# Patient Record
Sex: Female | Born: 1935 | Race: White | Hispanic: No | Smoking: Never smoker
Health system: Southern US, Community
[De-identification: ages and names within clinical notes are randomized; demographics above are authoritative.]

## PROBLEM LIST (undated history)

## (undated) DIAGNOSIS — E785 Hyperlipidemia, unspecified: Secondary | ICD-10-CM

## (undated) DIAGNOSIS — I639 Cerebral infarction, unspecified: Secondary | ICD-10-CM

## (undated) DIAGNOSIS — I2510 Atherosclerotic heart disease of native coronary artery without angina pectoris: Secondary | ICD-10-CM

## (undated) DIAGNOSIS — I10 Essential (primary) hypertension: Secondary | ICD-10-CM

## (undated) DIAGNOSIS — E119 Type 2 diabetes mellitus without complications: Secondary | ICD-10-CM

## (undated) DIAGNOSIS — I701 Atherosclerosis of renal artery: Secondary | ICD-10-CM

## (undated) DIAGNOSIS — I495 Sick sinus syndrome: Secondary | ICD-10-CM

## (undated) DIAGNOSIS — Z950 Presence of cardiac pacemaker: Secondary | ICD-10-CM

---

## 2014-05-15 HISTORY — PX: PACEMAKER PLACEMENT: SHX43

## 2016-09-23 ENCOUNTER — Emergency Department (HOSPITAL_COMMUNITY): Payer: Medicare Other

## 2016-09-23 ENCOUNTER — Encounter (HOSPITAL_COMMUNITY): Payer: Self-pay | Admitting: Emergency Medicine

## 2016-09-23 ENCOUNTER — Inpatient Hospital Stay (HOSPITAL_COMMUNITY)
Admission: EM | Admit: 2016-09-23 | Discharge: 2016-09-26 | DRG: 065 | Disposition: A | Discharge status: Rehabilitation Facility | Payer: Medicare Other | Attending: Internal Medicine | Admitting: Internal Medicine

## 2016-09-23 DIAGNOSIS — Z6833 Body mass index (BMI) 33.0-33.9, adult: Secondary | ICD-10-CM

## 2016-09-23 DIAGNOSIS — Z79899 Other long term (current) drug therapy: Secondary | ICD-10-CM | POA: Diagnosis not present

## 2016-09-23 DIAGNOSIS — R7309 Other abnormal glucose: Secondary | ICD-10-CM | POA: Diagnosis not present

## 2016-09-23 DIAGNOSIS — G4700 Insomnia, unspecified: Secondary | ICD-10-CM | POA: Diagnosis not present

## 2016-09-23 DIAGNOSIS — F419 Anxiety disorder, unspecified: Secondary | ICD-10-CM | POA: Diagnosis not present

## 2016-09-23 DIAGNOSIS — E1142 Type 2 diabetes mellitus with diabetic polyneuropathy: Secondary | ICD-10-CM | POA: Diagnosis not present

## 2016-09-23 DIAGNOSIS — E1149 Type 2 diabetes mellitus with other diabetic neurological complication: Secondary | ICD-10-CM | POA: Diagnosis not present

## 2016-09-23 DIAGNOSIS — E1140 Type 2 diabetes mellitus with diabetic neuropathy, unspecified: Secondary | ICD-10-CM | POA: Diagnosis present

## 2016-09-23 DIAGNOSIS — Z66 Do not resuscitate: Secondary | ICD-10-CM | POA: Diagnosis present

## 2016-09-23 DIAGNOSIS — E1143 Type 2 diabetes mellitus with diabetic autonomic (poly)neuropathy: Secondary | ICD-10-CM | POA: Diagnosis not present

## 2016-09-23 DIAGNOSIS — G8194 Hemiplegia, unspecified affecting left nondominant side: Secondary | ICD-10-CM | POA: Diagnosis present

## 2016-09-23 DIAGNOSIS — R29703 NIHSS score 3: Secondary | ICD-10-CM | POA: Diagnosis present

## 2016-09-23 DIAGNOSIS — Z7901 Long term (current) use of anticoagulants: Secondary | ICD-10-CM | POA: Diagnosis not present

## 2016-09-23 DIAGNOSIS — N179 Acute kidney failure, unspecified: Secondary | ICD-10-CM | POA: Diagnosis present

## 2016-09-23 DIAGNOSIS — I2510 Atherosclerotic heart disease of native coronary artery without angina pectoris: Secondary | ICD-10-CM | POA: Diagnosis present

## 2016-09-23 DIAGNOSIS — R29810 Facial weakness: Secondary | ICD-10-CM | POA: Diagnosis present

## 2016-09-23 DIAGNOSIS — F329 Major depressive disorder, single episode, unspecified: Secondary | ICD-10-CM | POA: Diagnosis present

## 2016-09-23 DIAGNOSIS — I69354 Hemiplegia and hemiparesis following cerebral infarction affecting left non-dominant side: Secondary | ICD-10-CM | POA: Diagnosis present

## 2016-09-23 DIAGNOSIS — R4182 Altered mental status, unspecified: Secondary | ICD-10-CM | POA: Diagnosis not present

## 2016-09-23 DIAGNOSIS — I6789 Other cerebrovascular disease: Secondary | ICD-10-CM | POA: Diagnosis not present

## 2016-09-23 DIAGNOSIS — Z7982 Long term (current) use of aspirin: Secondary | ICD-10-CM

## 2016-09-23 DIAGNOSIS — I639 Cerebral infarction, unspecified: Secondary | ICD-10-CM | POA: Diagnosis present

## 2016-09-23 DIAGNOSIS — I10 Essential (primary) hypertension: Secondary | ICD-10-CM | POA: Diagnosis present

## 2016-09-23 DIAGNOSIS — K5901 Slow transit constipation: Secondary | ICD-10-CM | POA: Diagnosis present

## 2016-09-23 DIAGNOSIS — E782 Mixed hyperlipidemia: Secondary | ICD-10-CM | POA: Diagnosis not present

## 2016-09-23 DIAGNOSIS — R3915 Urgency of urination: Secondary | ICD-10-CM | POA: Diagnosis not present

## 2016-09-23 DIAGNOSIS — Z950 Presence of cardiac pacemaker: Secondary | ICD-10-CM | POA: Diagnosis not present

## 2016-09-23 DIAGNOSIS — I495 Sick sinus syndrome: Secondary | ICD-10-CM | POA: Diagnosis present

## 2016-09-23 DIAGNOSIS — Z7902 Long term (current) use of antithrombotics/antiplatelets: Secondary | ICD-10-CM | POA: Diagnosis not present

## 2016-09-23 DIAGNOSIS — I150 Renovascular hypertension: Secondary | ICD-10-CM | POA: Diagnosis not present

## 2016-09-23 DIAGNOSIS — I63411 Cerebral infarction due to embolism of right middle cerebral artery: Secondary | ICD-10-CM | POA: Diagnosis not present

## 2016-09-23 DIAGNOSIS — R748 Abnormal levels of other serum enzymes: Secondary | ICD-10-CM

## 2016-09-23 DIAGNOSIS — R4781 Slurred speech: Secondary | ICD-10-CM | POA: Diagnosis present

## 2016-09-23 DIAGNOSIS — E784 Other hyperlipidemia: Secondary | ICD-10-CM | POA: Diagnosis not present

## 2016-09-23 DIAGNOSIS — E781 Pure hyperglyceridemia: Secondary | ICD-10-CM | POA: Diagnosis present

## 2016-09-23 DIAGNOSIS — K5900 Constipation, unspecified: Secondary | ICD-10-CM | POA: Diagnosis not present

## 2016-09-23 DIAGNOSIS — E871 Hypo-osmolality and hyponatremia: Secondary | ICD-10-CM | POA: Diagnosis not present

## 2016-09-23 DIAGNOSIS — I69390 Apraxia following cerebral infarction: Secondary | ICD-10-CM | POA: Diagnosis not present

## 2016-09-23 DIAGNOSIS — E869 Volume depletion, unspecified: Secondary | ICD-10-CM | POA: Diagnosis present

## 2016-09-23 DIAGNOSIS — E785 Hyperlipidemia, unspecified: Secondary | ICD-10-CM | POA: Diagnosis not present

## 2016-09-23 DIAGNOSIS — E119 Type 2 diabetes mellitus without complications: Secondary | ICD-10-CM

## 2016-09-23 DIAGNOSIS — N289 Disorder of kidney and ureter, unspecified: Secondary | ICD-10-CM | POA: Diagnosis not present

## 2016-09-23 DIAGNOSIS — E669 Obesity, unspecified: Secondary | ICD-10-CM | POA: Diagnosis present

## 2016-09-23 DIAGNOSIS — I169 Hypertensive crisis, unspecified: Secondary | ICD-10-CM | POA: Diagnosis not present

## 2016-09-23 DIAGNOSIS — Z7984 Long term (current) use of oral hypoglycemic drugs: Secondary | ICD-10-CM | POA: Diagnosis not present

## 2016-09-23 DIAGNOSIS — I959 Hypotension, unspecified: Secondary | ICD-10-CM | POA: Diagnosis not present

## 2016-09-23 DIAGNOSIS — B964 Proteus (mirabilis) (morganii) as the cause of diseases classified elsewhere: Secondary | ICD-10-CM | POA: Diagnosis not present

## 2016-09-23 DIAGNOSIS — I6300 Cerebral infarction due to thrombosis of unspecified precerebral artery: Secondary | ICD-10-CM | POA: Diagnosis not present

## 2016-09-23 DIAGNOSIS — M792 Neuralgia and neuritis, unspecified: Secondary | ICD-10-CM

## 2016-09-23 DIAGNOSIS — N390 Urinary tract infection, site not specified: Secondary | ICD-10-CM | POA: Diagnosis not present

## 2016-09-23 HISTORY — DX: Essential (primary) hypertension: I10

## 2016-09-23 HISTORY — DX: Presence of cardiac pacemaker: Z95.0

## 2016-09-23 HISTORY — DX: Type 2 diabetes mellitus without complications: E11.9

## 2016-09-23 LAB — CBC
HCT: 40.5 % (ref 36.0–46.0)
HCT: 43.1 % (ref 36.0–46.0)
HEMOGLOBIN: 13.8 g/dL (ref 12.0–15.0)
Hemoglobin: 14.2 g/dL (ref 12.0–15.0)
MCH: 29.4 pg (ref 26.0–34.0)
MCH: 28.9 pg (ref 26.0–34.0)
MCHC: 34.1 g/dL (ref 30.0–36.0)
MCHC: 32.9 g/dL (ref 30.0–36.0)
MCV: 86.4 fL (ref 78.0–100.0)
MCV: 87.6 fL (ref 78.0–100.0)
PLATELETS: 248 10*3/uL (ref 150–400)
PLATELETS: 242 10*3/uL (ref 150–400)
RBC: 4.69 MIL/uL (ref 3.87–5.11)
RBC: 4.92 MIL/uL (ref 3.87–5.11)
RDW: 14 % (ref 11.5–15.5)
RDW: 13.9 % (ref 11.5–15.5)
WBC: 9.6 10*3/uL (ref 4.0–10.5)
WBC: 7.2 10*3/uL (ref 4.0–10.5)

## 2016-09-23 LAB — URINALYSIS, ROUTINE W REFLEX MICROSCOPIC
Bilirubin Urine: NEGATIVE
GLUCOSE, UA: 250 mg/dL — AB
Hgb urine dipstick: NEGATIVE
KETONES UR: NEGATIVE mg/dL
LEUKOCYTES UA: NEGATIVE
NITRITE: NEGATIVE
PROTEIN: NEGATIVE mg/dL
Specific Gravity, Urine: 1.014 (ref 1.005–1.030)
pH: 6.5 (ref 5.0–8.0)

## 2016-09-23 LAB — COMPREHENSIVE METABOLIC PANEL
ALK PHOS: 41 U/L (ref 38–126)
ALT: 28 U/L (ref 14–54)
ANION GAP: 9 (ref 5–15)
AST: 35 U/L (ref 15–41)
Albumin: 4 g/dL (ref 3.5–5.0)
BUN: 22 mg/dL — ABNORMAL HIGH (ref 6–20)
CALCIUM: 9.1 mg/dL (ref 8.9–10.3)
CO2: 29 mmol/L (ref 22–32)
CREATININE: 1.21 mg/dL — AB (ref 0.44–1.00)
Chloride: 100 mmol/L — ABNORMAL LOW (ref 101–111)
GFR, EST AFRICAN AMERICAN: 44 mL/min — AB (ref >60)
GFR, EST NON AFRICAN AMERICAN: 38 mL/min — AB (ref >60)
Glucose, Bld: 262 mg/dL — ABNORMAL HIGH (ref 65–99)
Potassium: 4.5 mmol/L (ref 3.5–5.1)
SODIUM: 138 mmol/L (ref 135–145)
Total Bilirubin: 0.7 mg/dL (ref 0.3–1.2)
Total Protein: 6.5 g/dL (ref 6.5–8.1)

## 2016-09-23 LAB — CREATININE, SERUM
CREATININE: 1.12 mg/dL — AB (ref 0.44–1.00)
GFR, EST AFRICAN AMERICAN: 48 mL/min — AB (ref >60)
GFR, EST NON AFRICAN AMERICAN: 41 mL/min — AB (ref >60)

## 2016-09-23 LAB — CBG MONITORING, ED: GLUCOSE-CAPILLARY: 257 mg/dL — AB (ref 65–99)

## 2016-09-23 LAB — TROPONIN I

## 2016-09-23 MED ORDER — BUSPIRONE HCL 10 MG PO TABS
5.0000 mg | ORAL_TABLET | Freq: Two times a day (BID) | ORAL | Status: DC
  Administered 2016-09-23 – 2016-09-26 (×6): 5 mg via ORAL
  Filled 2016-09-23 (×6): qty 1

## 2016-09-23 MED ORDER — ASPIRIN 300 MG RE SUPP: 300.0000 mg | Freq: Every day | RECTAL | Status: DC

## 2016-09-23 MED ORDER — SODIUM CHLORIDE 0.9 % IV SOLN
INTRAVENOUS | Status: DC
  Administered 2016-09-23: 17:00:00 via INTRAVENOUS

## 2016-09-23 MED ORDER — ENOXAPARIN SODIUM 40 MG/0.4ML ~~LOC~~ SOLN
40.0000 mg | SUBCUTANEOUS | Status: DC
  Administered 2016-09-23 – 2016-09-26 (×4): 40 mg via SUBCUTANEOUS
  Filled 2016-09-23 (×4): qty 0.4

## 2016-09-23 MED ORDER — RANOLAZINE ER 500 MG PO TB12
500.0000 mg | ORAL_TABLET | Freq: Two times a day (BID) | ORAL | Status: DC
  Administered 2016-09-23 – 2016-09-26 (×6): 500 mg via ORAL
  Filled 2016-09-23 (×6): qty 1

## 2016-09-23 MED ORDER — PREGABALIN 75 MG PO CAPS
75.0000 mg | ORAL_CAPSULE | Freq: Two times a day (BID) | ORAL | Status: DC
  Administered 2016-09-23 – 2016-09-26 (×6): 75 mg via ORAL
  Filled 2016-09-23 (×6): qty 1

## 2016-09-23 MED ORDER — ASPIRIN 325 MG PO TABS
325.0000 mg | ORAL_TABLET | Freq: Every day | ORAL | Status: DC
  Administered 2016-09-24: 325 mg via ORAL
  Filled 2016-09-23: qty 1

## 2016-09-23 MED ORDER — MELATONIN 3 MG PO TABS
3.0000 mg | ORAL_TABLET | Freq: Every day | ORAL | Status: DC
  Administered 2016-09-23 – 2016-09-25 (×3): 3 mg via ORAL
  Filled 2016-09-23 (×4): qty 1

## 2016-09-23 MED ORDER — STROKE: EARLY STAGES OF RECOVERY BOOK
Freq: Once | Status: AC
  Administered 2016-09-23: 19:00:00

## 2016-09-23 MED ORDER — ASPIRIN EC 325 MG PO TBEC: 325.0000 mg | DELAYED_RELEASE_TABLET | Freq: Every day | ORAL | Status: DC

## 2016-09-23 MED ORDER — NIACIN 500 MG PO TABS
250.0000 mg | ORAL_TABLET | ORAL | Status: DC
  Administered 2016-09-24 – 2016-09-25 (×2): 250 mg via ORAL
  Filled 2016-09-23 (×3): qty 1

## 2016-09-23 MED ORDER — ISOSORBIDE MONONITRATE ER 30 MG PO TB24
30.0000 mg | ORAL_TABLET | Freq: Every day | ORAL | Status: DC
  Administered 2016-09-24 – 2016-09-26 (×3): 30 mg via ORAL
  Filled 2016-09-23 (×3): qty 1

## 2016-09-23 MED ORDER — SENNOSIDES-DOCUSATE SODIUM 8.6-50 MG PO TABS
1.0000 | ORAL_TABLET | Freq: Once | ORAL | Status: AC
  Administered 2016-09-23: 1 via ORAL
  Filled 2016-09-23: qty 1

## 2016-09-23 MED ORDER — METOPROLOL SUCCINATE ER 25 MG PO TB24
50.0000 mg | ORAL_TABLET | Freq: Every day | ORAL | Status: DC
  Administered 2016-09-23 – 2016-09-25 (×3): 50 mg via ORAL
  Filled 2016-09-23 (×3): qty 2

## 2016-09-23 MED ORDER — FENOFIBRATE 160 MG PO TABS
160.0000 mg | ORAL_TABLET | Freq: Every day | ORAL | Status: DC
  Administered 2016-09-24 – 2016-09-26 (×3): 160 mg via ORAL
  Filled 2016-09-23 (×3): qty 1

## 2016-09-23 NOTE — Progress Notes (Signed)
[facility]; patient is alert and oriented; son is at the bedside; patient oriented to room and unit routine; fall safety measures in place; patient cooperative.

## 2016-09-23 NOTE — ED Notes (Signed)
Pt family reports that pt has has intermittent slurred speech this week as well as some confusion. Pt adds that she has been forgetful and thought that someone was staying in her room last pm, but no one was there. Pt is A&O and in NAD. Pt only c/o chronic bilateral arm and foot pain from neuropathy.

## 2016-09-23 NOTE — ED Triage Notes (Addendum)
Patient brought in by son from [facility] in [place]. Altered mental status for 1 week now. Son states that patient has been forgetting routine things. Forgot how to use the bathroom properly, used it on the floor this morning. Unable to identify numbers and letters. Difficulty ambulating.

## 2016-09-23 NOTE — H&P (Signed)
[facility] History and Physical  Charlene Greene ZOX:[id number] DOB: 05-09-1922 DOA: 09/23/2016  Referring physician: Fayrene Fearing PCP: No primary care provider on file.  Specialists: Neurology  Chief Complaint:   HPI:  18 ? Htn HLD PPM 2/2 to SSS in July 14th, 2015-Charlene Greene Prior CAD Some reconstructive left breast surgery in the past? DM ty II on oral meds depression  Came to the emergency room from [facility] where she's been in the past 2-1/2 years She has been weak on her left side for the past 3-4 days and having difficulty with her usual ambulation with a walker Her daughter also states that previously very highly functioning and is able to recite things without any difficulty but she had some dysnomia over the past 2-3 days and was not able to pronounce the word "TWO" and we counted things to her daughter that never actually happened This morning or yesterday prior to admission was found with stool on the floor and she was found in her bedroom having all of her clothes off and having turned off denominator reporting for breakfast which is unusual behavior for her   Eventually she was transported to the [facility] and a workup was performed showing BUN/creatinine 22/1.2 Lloyd 100 Glucose 260, LFTs normal CBC within normal limits CT head showed abnormal right basal ganglia fashion Loraine Leriche old is acute infarction with questionable vasogenic edema Chest x-ray showed no active disease  No recent illnesses no diarrheal illness no cough no cold no fever no chills no nausea no vomiting some slight left sided weakness affecting ambulation     Past Medical History:  Diagnosis Date  . Diabetes mellitus without complication (HCC)   . Hypertension   . Pacemaker    Past Surgical History:  Procedure Laterality Date  . PACEMAKER PLACEMENT     Social History:  Social History   Social History Narrative  . No narrative on file    No  Known Allergies  No family history on file.  Hypertension Heart disease   Prior to Admission medications   Medication Sig Start Date End Date Taking? Authorizing Provider  busPIRone (BUSPAR) 5 MG tablet Take 5 mg by mouth 2 (two) times daily.   Yes Historical Provider, MD  cloNIDine (CATAPRES) 0.2 MG tablet Take 0.2 mg by mouth 2 (two) times daily.   Yes Historical Provider, MD  fenofibrate 160 MG tablet Take 160 mg by mouth daily.   Yes Historical Provider, MD  glimepiride (AMARYL) 2 MG tablet Take 2-3 mg by mouth 2 (two) times daily. 1 tab in the am and 1/2 hs   Yes Historical Provider, MD  isosorbide mononitrate (IMDUR) 60 MG 24 hr tablet Take 60 mg by mouth daily.   Yes Historical Provider, MD  lisinopril (PRINIVIL,ZESTRIL) 40 MG tablet Take 40 mg by mouth 2 (two) times daily.   Yes Historical Provider, MD  pregabalin (LYRICA) 75 MG capsule Take 75 mg by mouth 2 (two) times daily.   Yes Historical Provider, MD  metoprolol succinate (TOPROL-XL) 100 MG 24 hr tablet  08/31/16   Historical Provider, MD   Physical Exam: Vitals:   09/23/16 1215 09/23/16 1415 09/23/16 1635 09/23/16 1636  BP: 162/79 144/72 (!) 203/92 (!) 201/94  Pulse:  60 (!) 53   Resp: (!) 28 20 20    Temp:  98.4 F (36.9 C) 98.4 F (36.9 C)   TempSrc:  Oral Oral   SpO2:  93% 92%    EOMI NCAT Extraocular movements  intact Poor dentition No JVD No bruit S1-S2 no murmur rub or gallop Chest clinically clear Finger-nose-finger test is intact Abdomen is soft nontender nondistended no rebound or guarding No lower extremity edema Power in lower and upper extremities is within normal limits Sensory is grossly intact There is some diffuse swelling to the right upper extremity  Labs on Admission:  Basic Metabolic Panel:  Recent Labs Lab 09/23/16 1136 09/23/16 1650  NA 138  --   K 4.5  --   CL 100*  --   CO2 29  --   GLUCOSE 262*  --   BUN 22*  --   CREATININE 1.21* 1.12*  CALCIUM 9.1  --    Liver  Function Tests:  Recent Labs Lab 09/23/16 1136  AST 35  ALT 28  ALKPHOS 41  BILITOT 0.7  PROT 6.5  ALBUMIN 4.0   No results for input(s): LIPASE, AMYLASE in the last 168 hours. No results for input(s): AMMONIA in the last 168 hours. CBC:  Recent Labs Lab 09/23/16 1136 09/23/16 1650  WBC 9.6 7.2  HGB 13.8 14.2  HCT 40.5 43.1  MCV 86.4 87.6  PLT 248 242   Cardiac Enzymes:  Recent Labs Lab 09/23/16 1136  TROPONINI <0.03    BNP (last 3 results) No results for input(s): BNP in the last 8760 hours.  ProBNP (last 3 results) No results for input(s): PROBNP in the last 8760 hours.  CBG:  Recent Labs Lab 09/23/16 1145  GLUCAP 257*    Radiological Exams on Admission: Ct Head Wo Contrast  Result Date: 09/23/2016 CLINICAL DATA:  Mental status deterioration over the last week. EXAM: CT HEAD WITHOUT CONTRAST TECHNIQUE: Contiguous axial images were obtained from the base of the skull through the vertex without intravenous contrast. COMPARISON:  None. FINDINGS: Brain: The brain shows generalized atrophy. There are moderate chronic appearing small vessel ischemic changes affecting the cerebral hemispheric white matter. There is an abnormal pattern in the right basal gangliar region which could relate to old and/or recent infarction. However, some characteristics suggest the possibility of vasogenic edema. Therefore, MRI would be suggested if the patient is a candidate. No evidence of hydrocephalus or extra-axial collection. Vascular: There is atherosclerotic calcification of the major vessels at the base of the brain. Skull: Negative Sinuses/Orbits: Clear/normal Other: None significant IMPRESSION: Atrophy an extensive chronic small vessel ischemic changes. Abnormal appearance of the right basal gangliar region that could represent a combination of old and acute infarction. However, the pattern is somewhat unusual, raising the possibility of vasogenic edema. MRI would be suggested if  the patient is a candidate. Electronically Signed   By: Paulina FusiMark  Shogry M.D.   On: 09/23/2016 12:51   Dg Chest Port 1 View  Result Date: 09/23/2016 CLINICAL DATA:  Slurred speech and confusion. EXAM: PORTABLE CHEST 1 VIEW COMPARISON:  None. FINDINGS: No pneumothorax. Cardiomegaly. The hila and mediastinum are unremarkable. No pulmonary nodules or masses. Mild left basilar atelectasis. No suspicious infiltrate. Chronic changes in the shoulders, left worse than right. IMPRESSION: No active disease. Electronically Signed   By: Gerome Samavid  Williams III M.D   On: 09/23/2016 12:55    EKG: Independently reviewed. Atrial paced rhythm QRS axis is -308 ST-T wave changes  Assessment/Plan  R Basal ganlia CVA We [facility] neurolog[facility] requests her presence there We will admit her under standard stroke protocol  and we'll give her 325 ASA Therapy evaluations this patient will probably need skilled care  outpatient follow-up Hold antihypertensive medication for now and probable resumption is reasonable in the next couple of days to gradually bring down her blood pressure   sinus syndrome status post pacemaker placement 2015 MRI compatibility will have to be arranged at the radiology and they are aware of this  Type 2 diabetes mellitus with blood sugars in the 230 range Placed on sliding scale coverage before meals at bedtime once swallowing screen is evaluated Hold off on Amaryl 2 mg twice a day for now until discharge  CAD with history of CAD 201 Start Lower dose on Imdur 60-->30 for now but may be okay to resume the same as an outpatient Give 50 mg metoprolol XL as opposed to 100 mg daily and hold off on lisinopril for now Can continue Ranexa  500 2 times a day  Severe hypertriglyceridemia is usually on niacin and fenofibrate which can be continued  Mild volume depletion/acute kidney injury unclear baseline Start saline 50 cc per hour repeat labs a.m.   DO NOT  RESUSCITATE status confirmed with patient at bedside Long discussion with daughter who is a Engineer, civil (consulting)nurse at [employer] long past Inpatient tele    Pleas KochJai Diahn Waidelich, MD [facility] [phone]) [phone]      If 7PM-7AM, please contact night-coverage [email or web address] Password [email or web address] 09/23/2016, 5:29 PM

## 2016-09-23 NOTE — Progress Notes (Deleted)
[facility] History and Physical  Charlene Greene ZOX:[id number] DOB: 09/13/1922 DOA: 09/23/2016  Referring physician: Fayrene Fearing PCP: No primary care provider on file.  Specialists: Neurology  Chief Complaint:   HPI:  86 ? Htn HLD PPM 2/2 to SSS in July 14th, 2015-[place] Prior CAD Some reconstructive left breast surgery in the past? DM ty II on oral meds depression  Came to the emergency room from [facility] where she's been in the past 2-1/2 years She has been weak on her left side for the past 3-4 days and having difficulty with her usual ambulation with a walker Her daughter also states that previously very highly functioning and is able to recite things without any difficulty but she had some dysnomia over the past 2-3 days and was not able to pronounce the word "TWO" and we counted things to her daughter that never actually happened This morning or yesterday prior to admission was found with stool on the floor and she was found in her bedroom having all of her clothes off and having turned off denominator reporting for breakfast which is unusual behavior for her   Eventually she was transported to the hospital at [facility] and a workup was performed showing BUN/creatinine 22/1.2 Lloyd 100 Glucose 260, LFTs normal CBC within normal limits CT head showed abnormal right basal ganglia fashion Loraine Leriche old is acute infarction with questionable vasogenic edema Chest x-ray showed no active disease  No recent illnesses no diarrheal illness no cough no cold no fever no chills no nausea no vomiting some slight left sided weakness affecting ambulation     Past Medical History:  Diagnosis Date  . Diabetes mellitus without complication (HCC)   . Hypertension   . Pacemaker    No past surgical history on file. Social History:  Social History   Social History Narrative  . No narrative on file    No Known Allergies  No family history on file.   Hypertension Heart disease   Prior to Admission medications   Medication Sig Start Date End Date Taking? Authorizing Provider  busPIRone (BUSPAR) 5 MG tablet Take 5 mg by mouth 2 (two) times daily.   Yes Historical Provider, MD  cloNIDine (CATAPRES) 0.2 MG tablet Take 0.2 mg by mouth 2 (two) times daily.   Yes Historical Provider, MD  fenofibrate 160 MG tablet Take 160 mg by mouth daily.   Yes Historical Provider, MD  glimepiride (AMARYL) 2 MG tablet Take 2-3 mg by mouth 2 (two) times daily. 1 tab in the am and 1/2 hs   Yes Historical Provider, MD  isosorbide mononitrate (IMDUR) 60 MG 24 hr tablet Take 60 mg by mouth daily.   Yes Historical Provider, MD  lisinopril (PRINIVIL,ZESTRIL) 40 MG tablet Take 40 mg by mouth 2 (two) times daily.   Yes Historical Provider, MD  pregabalin (LYRICA) 75 MG capsule Take 75 mg by mouth 2 (two) times daily.   Yes Historical Provider, MD  metoprolol succinate (TOPROL-XL) 100 MG 24 hr tablet  08/31/16   Historical Provider, MD   Physical Exam: Vitals:   09/23/16 1125 09/23/16 1130 09/23/16 1147 09/23/16 1215  BP: 172/80 179/61  162/79  Pulse: 65 62    Resp: 17 (!) 27  (!) 28  Temp: 98.4 F (36.9 C)  98.4 F (36.9 C)   TempSrc: Oral     SpO2: 93% 94%     EOMI NCAT Extraocular movements intact Poor dentition No JVD No bruit S1-S2 no murmur rub  or gallop Chest clinically clear Finger-nose-finger test is intact Abdomen is soft nontender nondistended no rebound or guarding No lower extremity edema Power in lower and upper extremities is within normal limits Sensory is grossly intact There is some diffuse swelling to the right upper extremity  Labs on Admission:  Basic Metabolic Panel:  Recent Labs Lab 09/23/16 1136  NA 138  K 4.5  CL 100*  CO2 29  GLUCOSE 262*  BUN 22*  CREATININE 1.21*  CALCIUM 9.1   Liver Function Tests:  Recent Labs Lab 09/23/16 1136  AST 35  ALT 28  ALKPHOS 41  BILITOT 0.7  PROT 6.5  ALBUMIN 4.0   No  results for input(s): LIPASE, AMYLASE in the last 168 hours. No results for input(s): AMMONIA in the last 168 hours. CBC:  Recent Labs Lab 09/23/16 1136  WBC 9.6  HGB 13.8  HCT 40.5  MCV 86.4  PLT 248   Cardiac Enzymes:  Recent Labs Lab 09/23/16 1136  TROPONINI <0.03    BNP (last 3 results) No results for input(s): BNP in the last 8760 hours.  ProBNP (last 3 results) No results for input(s): PROBNP in the last 8760 hours.  CBG:  Recent Labs Lab 09/23/16 1145  GLUCAP 257*    Radiological Exams on Admission: Ct Head Wo Contrast  Result Date: 09/23/2016 CLINICAL DATA:  Mental status deterioration over the last week. EXAM: CT HEAD WITHOUT CONTRAST TECHNIQUE: Contiguous axial images were obtained from the base of the skull through the vertex without intravenous contrast. COMPARISON:  None. FINDINGS: Brain: The brain shows generalized atrophy. There are moderate chronic appearing small vessel ischemic changes affecting the cerebral hemispheric white matter. There is an abnormal pattern in the right basal gangliar region which could relate to old and/or recent infarction. However, some characteristics suggest the possibility of vasogenic edema. Therefore, MRI would be suggested if the patient is a candidate. No evidence of hydrocephalus or extra-axial collection. Vascular: There is atherosclerotic calcification of the major vessels at the base of the brain. Skull: Negative Sinuses/Orbits: Clear/normal Other: None significant IMPRESSION: Atrophy an extensive chronic small vessel ischemic changes. Abnormal appearance of the right basal gangliar region that could represent a combination of old and acute infarction. However, the pattern is somewhat unusual, raising the possibility of vasogenic edema. MRI would be suggested if the patient is a candidate. Electronically Signed   By: Paulina FusiMark  Shogry M.D.   On: 09/23/2016 12:51   Dg Chest Port 1 View  Result Date: 09/23/2016 CLINICAL DATA:   Slurred speech and confusion. EXAM: PORTABLE CHEST 1 VIEW COMPARISON:  None. FINDINGS: No pneumothorax. Cardiomegaly. The hila and mediastinum are unremarkable. No pulmonary nodules or masses. Mild left basilar atelectasis. No suspicious infiltrate. Chronic changes in the shoulders, left worse than right. IMPRESSION: No active disease. Electronically Signed   By: Gerome Samavid  Williams III M.D   On: 09/23/2016 12:55    EKG: Independently reviewed. Atrial paced rhythm QRS axis is -308 ST-T wave changes  Assessment/Plan  R Basal ganlia CVA We [facility] as neurologist requests her presence there We will admit her under standard stroke protocol  and we'll give her 325 ASA Therapy evaluations this patient will probably need skilled care outpatient follow-up Hold antihypertensive medication for now and probable resumption is reasonable in the next couple of days to gradually bring down her blood pressure   sinus syndrome status post pacemaker placement 2015 MRI compatibility will have to be arranged at  the radiology and they are aware of this  Type 2 diabetes mellitus with blood sugars in the 230 range Placed on sliding scale coverage before meals at bedtime once swallowing screen is evaluated Hold off on Amaryl 2 mg twice a day for now until discharge  CAD with history of CAD 201 Start Lower dose on Imdur 60-->30 for now but may be okay to resume the same as an outpatient Give 50 mg metoprolol XL as opposed to 100 mg daily and hold off on lisinopril for now Can continue Ranexa  500 2 times a day  Severe hypertriglyceridemia is usually on niacin and fenofibrate which can be continued  Mild volume depletion/acute kidney injury unclear baseline Start saline 50 cc per hour repeat labs a.m.   DO NOT RESUSCITATE status confirmed with patient at bedside Long discussion with daughter who is a Engineer, civil (consulting)nurse at [employer] long past Inpatient tele    Pleas KochJai Imani Sherrin, MD [facility] [phone]      If 7PM-7AM, please contact night-coverage [email or web address] Password [email or web address] 09/23/2016, 2:08 PM

## 2016-09-23 NOTE — ED Notes (Signed)
Bed: [id number] Expected date:  Expected time:  Means of arrival:  Comments: Hold 16 when discharge is out.

## 2016-09-23 NOTE — ED Provider Notes (Addendum)
[facility]-EMERGENCY DEPT Provider Note   CSN: [id number] Arrival date & time: 09/23/16  1111     History   Chief Complaint Chief Complaint  Patient presents with  . Altered Mental Status    HPI Charlene Greene is a 82 y.o. female. She presents for evaluation of confusion and weakness.  She is 89. According to her family she is "very sharp and still very with it". [place] independent living community [place]. They have noticed some changes for about one week.  About 2 weeks ago her "best friend died". She states that she was sat in more and this. However, going to family she still seemed well for more than a week before the symptoms started 7 days ago.  She notices difficulty walking. She feels very weak. In fact this morning her son had to help her dressed and stand and walk which is unusual for her.  She feels swollen with her left hand. She denies feeling weak or clumsy with her left hand or any unilateral weakness. No chest pain or shortness of breath. Has occasional cough. Does not feel short of breath. His had no fever. No appetite. All by mouth. No vomiting. No urinary symptoms.  History of diabetes and neuropathy and has chronic feet pain which lead her walking somewhat. No change in the appearance of her feet or her neuropathy symptoms  No change in medications. No fall or injury. No acute other symptoms.  She lives alone. However she put out a towel and soap and wash cough because she thought someone was staying at her apartment with her, when no one in fact has been.    HPI  Past Medical History:  Diagnosis Date  . Diabetes mellitus without complication (HCC)   . Hypertension   . Pacemaker     Patient Active Problem List   Diagnosis Date Noted  . CVA (cerebral vascular accident) (HCC) 09/23/2016    No past surgical history on file.  OB History    Gravida Para Term Preterm AB Living             2   SAB TAB Ectopic Multiple Live Births         Home Medications    Prior to Admission medications   Medication Sig Start Date End Date Taking? Authorizing Provider  amLODipine (NORVASC) 5 MG tablet Take 5 mg by mouth 2 (two) times daily.   Yes Historical Provider, MD  aspirin 325 MG EC tablet Take 325 mg by mouth daily.   Yes Historical Provider, MD  BL MAGNESIUM PO Take 1 tablet by mouth daily.   Yes Historical Provider, MD  busPIRone (BUSPAR) 5 MG tablet Take 5 mg by mouth 2 (two) times daily.   Yes Historical Provider, MD  calcium carbonate (OSCAL) 1500 (600 Ca) MG TABS tablet Take 600 mg of elemental calcium by mouth 2 (two) times daily with a meal.   Yes Historical Provider, MD  cloNIDine (CATAPRES) 0.2 MG tablet Take 0.2 mg by mouth 2 (two) times daily.   Yes Historical Provider, MD  fenofibrate 160 MG tablet Take 160 mg by mouth daily.   Yes Historical Provider, MD  glimepiride (AMARYL) 2 MG tablet Take 2-3 mg by mouth 2 (two) times daily. 1 tab in the am and 1/2 hs   Yes Historical Provider, MD  isosorbide mononitrate (IMDUR) 60 MG 24 hr tablet Take 60 mg by mouth daily.   Yes Historical Provider, MD  lisinopril (PRINIVIL,ZESTRIL) 40  MG tablet Take 40 mg by mouth 2 (two) times daily.   Yes Historical Provider, MD  Melatonin 3 MG TABS Take 3 mg by mouth at bedtime.   Yes Historical Provider, MD  metoprolol succinate (TOPROL-XL) 100 MG 24 hr tablet Take 100 mg by mouth daily.  08/31/16  Yes Historical Provider, MD  Multiple Vitamin (MULTIVITAMIN WITH MINERALS) TABS tablet Take 1 tablet by mouth daily.   Yes Historical Provider, MD  Multiple Vitamins-Minerals (PRESERVISION AREDS 2 PO) Take 1 capsule by mouth 2 (two) times daily.   Yes Historical Provider, MD  niacin 250 MG tablet Take 250 mg by mouth every morning.   Yes Historical Provider, MD  Omega-3 Fatty Acids (FISH OIL) 1000 MG CPDR Take 2,000 capsules by mouth 2 (two) times daily.   Yes Historical Provider, MD  pregabalin (LYRICA) 75 MG capsule Take 75 mg by mouth 2  (two) times daily.   Yes Historical Provider, MD  ranolazine (RANEXA) 500 MG 12 hr tablet Take 500 mg by mouth 2 (two) times daily.   Yes Historical Provider, MD    Family History No family history on file.  Social History Social History  Substance Use Topics  . Smoking status: Never Smoker  . Smokeless tobacco: Never Used  . Alcohol use No     Allergies   Patient has no known allergies.   Review of Systems Review of Systems  Constitutional: Positive for activity change. Negative for appetite change, chills, diaphoresis and fever.  HENT: Negative for mouth sores, sore throat and trouble swallowing.   Eyes: Negative for visual disturbance.  Respiratory: Negative for cough, chest tightness, shortness of breath and wheezing.   Cardiovascular: Negative for chest pain.  Gastrointestinal: Negative for abdominal distention, abdominal pain, diarrhea, nausea and vomiting.  Endocrine: Negative for polydipsia, polyphagia and polyuria.  Genitourinary: Negative for decreased urine volume, difficulty urinating, dysuria, frequency, hematuria and urgency.  Musculoskeletal: Negative for gait problem.  Skin: Negative for color change, pallor and rash.  Neurological: Positive for weakness. Negative for dizziness, syncope, light-headedness and headaches.  Hematological: Does not bruise/bleed easily.  Psychiatric/Behavioral: Positive for confusion. Negative for behavioral problems.     Physical Exam Updated Vital Signs BP 144/72 (BP Location: Left Arm)   Pulse 60   Temp 98.4 F (36.9 C) (Oral)   Resp 20   SpO2 93%   Physical Exam  Constitutional: She is oriented to person, place, and time. She appears well-developed and well-nourished. No distress.  HENT:  Head: Normocephalic.  Eyes: Conjunctivae are normal. Pupils are equal, round, and reactive to light. No scleral icterus.  Neck: Normal range of motion. Neck supple. No thyromegaly present.  Cardiovascular: Normal rate and regular  rhythm.  Exam reveals no gallop and no friction rub.   No murmur heard. Pulmonary/Chest: Effort normal and breath sounds normal. No respiratory distress. She has no wheezes. She has no rales.      Abdominal: Soft. Bowel sounds are normal. She exhibits no distension. There is no tenderness. There is no rebound.  Musculoskeletal: Normal range of motion.  Neurological: She is alert and oriented to person, place, and time.  No obvious cranial nerve deficits. Very subtle left pronator drift. Can lift each foot off of the bed independently. Can hold the right for 4-5 seconds. Can hold the left leg for only 1 second.  Skin: Skin is warm and dry. No rash noted.  Psychiatric: She has a normal mood and affect. Her behavior is normal.  ED Treatments / Results  Labs (all labs ordered are listed, but only abnormal results are displayed) Labs Reviewed  COMPREHENSIVE METABOLIC PANEL - Abnormal; Notable for the following:       Result Value   Chloride 100 (*)    Glucose, Bld 262 (*)    BUN 22 (*)    Creatinine, Ser 1.21 (*)    GFR calc non Af Amer 38 (*)    GFR calc Af Amer 44 (*)    All other components within normal limits  URINALYSIS, ROUTINE W REFLEX MICROSCOPIC (NOT AT [facility]) - Abnormal; Notable for the following:    Glucose, UA 250 (*)    All other components within normal limits  CBG MONITORING, ED - Abnormal; Notable for the following:    Glucose-Capillary 257 (*)    All other components within normal limits  URINE CULTURE  CBC  TROPONIN I    EKG  EKG Interpretation  Date/Time:  Sunday September 23 2016 11:25:17 EST Ventricular Rate:  66 PR Interval:    QRS Duration: 100 QT Interval:  521 QTC Calculation: 546 R Axis:   -26 Text Interpretation:  Atrial-paced rhythm Borderline left axis deviation Borderline repolarization abnormality Prolonged QT interval Confirmed by Fayrene Fearing  MD, Andilynn Delavega ([id number]) on 09/23/2016 11:36:04 AM       Radiology Ct Head Wo Contrast  Result Date:  09/23/2016 CLINICAL DATA:  Mental status deterioration over the last week. EXAM: CT HEAD WITHOUT CONTRAST TECHNIQUE: Contiguous axial images were obtained from the base of the skull through the vertex without intravenous contrast. COMPARISON:  None. FINDINGS: Brain: The brain shows generalized atrophy. There are moderate chronic appearing small vessel ischemic changes affecting the cerebral hemispheric white matter. There is an abnormal pattern in the right basal gangliar region which could relate to old and/or recent infarction. However, some characteristics suggest the possibility of vasogenic edema. Therefore, MRI would be suggested if the patient is a candidate. No evidence of hydrocephalus or extra-axial collection. Vascular: There is atherosclerotic calcification of the major vessels at the base of the brain. Skull: Negative Sinuses/Orbits: Clear/normal Other: None significant IMPRESSION: Atrophy an extensive chronic small vessel ischemic changes. Abnormal appearance of the right basal gangliar region that could represent a combination of old and acute infarction. However, the pattern is somewhat unusual, raising the possibility of vasogenic edema. MRI would be suggested if the patient is a candidate. Electronically Signed   By: Paulina Fusi M.D.   On: 09/23/2016 12:51   Dg Chest Port 1 View  Result Date: 09/23/2016 CLINICAL DATA:  Slurred speech and confusion. EXAM: PORTABLE CHEST 1 VIEW COMPARISON:  None. FINDINGS: No pneumothorax. Cardiomegaly. The hila and mediastinum are unremarkable. No pulmonary nodules or masses. Mild left basilar atelectasis. No suspicious infiltrate. Chronic changes in the shoulders, left worse than right. IMPRESSION: No active disease. Electronically Signed   By: Gerome Sam III M.D   On: 09/23/2016 12:55    Procedures Procedures (including critical care time)  Medications Ordered in ED Medications - No data to display   Initial Impression / Assessment and Plan /  ED Course  I have reviewed the triage vital signs and the nursing notes.  Pertinent labs & imaging results that were available during my care of the patient were reviewed by me and considered in my medical decision making (see chart for details).  Clinical Course     Confusion and weakness. Some subtle signs of unilateral left-sided weakness. We'll plan metabolic evaluation, urinalysis, CT head and  possible MRI. Urine analysis and culture. EKG shows paced rhythm. Plan reevaluation after diagnostic studies  Final Clinical Impressions(s) / ED Diagnoses   Final diagnoses:  Cerebrovascular accident (CVA), unspecified mechanism (HCC)    CT shows subtle right basilar ganglia abnormality. Abnormal stroke. Radiology report could not rule out edema and recommends MRI. Patient has a pacemaker for sick sinus syndrome. She does have her card. I contacted [employer]. This is a MRI incompatible pacemaker. However, it has to be programmed before, and after her MRI. I spoke with the [employer] rep. She'll contact the [employer] representative [facility]. They will check with MRI in the morning and make arrangements to program her pacemaker prior to MRI.  D/W Dr. Mahala MenghiniSamtani, pt will be [facility] for [facility], stroke team eval, and MRI.  New Prescriptions New Prescriptions   No medications on file     Rolland PorterMark Audwin Semper, MD 09/23/16 1419    Rolland PorterMark Hektor Huston, MD 09/23/16 1531

## 2016-09-24 ENCOUNTER — Inpatient Hospital Stay (HOSPITAL_COMMUNITY): Payer: Medicare Other

## 2016-09-24 ENCOUNTER — Encounter (HOSPITAL_COMMUNITY): Payer: Self-pay | Admitting: Radiology

## 2016-09-24 DIAGNOSIS — I6789 Other cerebrovascular disease: Secondary | ICD-10-CM

## 2016-09-24 DIAGNOSIS — I63411 Cerebral infarction due to embolism of right middle cerebral artery: Secondary | ICD-10-CM

## 2016-09-24 DIAGNOSIS — E784 Other hyperlipidemia: Secondary | ICD-10-CM

## 2016-09-24 DIAGNOSIS — E785 Hyperlipidemia, unspecified: Secondary | ICD-10-CM | POA: Diagnosis present

## 2016-09-24 DIAGNOSIS — I10 Essential (primary) hypertension: Secondary | ICD-10-CM | POA: Diagnosis present

## 2016-09-24 DIAGNOSIS — E119 Type 2 diabetes mellitus without complications: Secondary | ICD-10-CM

## 2016-09-24 DIAGNOSIS — I495 Sick sinus syndrome: Secondary | ICD-10-CM | POA: Diagnosis present

## 2016-09-24 LAB — ECHOCARDIOGRAM COMPLETE
Height: 62 in
Weight: 2928 oz

## 2016-09-24 LAB — BASIC METABOLIC PANEL
ANION GAP: 13 (ref 5–15)
BUN: 16 mg/dL (ref 6–20)
CALCIUM: 9.1 mg/dL (ref 8.9–10.3)
CO2: 26 mmol/L (ref 22–32)
Chloride: 103 mmol/L (ref 101–111)
Creatinine, Ser: 1.07 mg/dL — ABNORMAL HIGH (ref 0.44–1.00)
GFR calc Af Amer: 51 mL/min — ABNORMAL LOW (ref >60)
GFR calc non Af Amer: 44 mL/min — ABNORMAL LOW (ref >60)
GLUCOSE: 138 mg/dL — AB (ref 65–99)
POTASSIUM: 3.9 mmol/L (ref 3.5–5.1)
Sodium: 142 mmol/L (ref 135–145)

## 2016-09-24 LAB — GLUCOSE, CAPILLARY: Glucose-Capillary: 173 mg/dL — ABNORMAL HIGH (ref 65–99)

## 2016-09-24 LAB — LIPID PANEL
CHOL/HDL RATIO: 8.6 ratio
Cholesterol: 274 mg/dL — ABNORMAL HIGH (ref 0–200)
HDL: 32 mg/dL — ABNORMAL LOW (ref >40)
LDL Cholesterol: UNDETERMINED mg/dL (ref 0–99)
Triglycerides: 530 mg/dL — ABNORMAL HIGH (ref <150)
VLDL: UNDETERMINED mg/dL (ref 0–40)

## 2016-09-24 LAB — URINE CULTURE: Culture: NO GROWTH

## 2016-09-24 MED ORDER — CLOPIDOGREL BISULFATE 75 MG PO TABS
75.0000 mg | ORAL_TABLET | Freq: Every day | ORAL | Status: DC
  Administered 2016-09-24 – 2016-09-26 (×3): 75 mg via ORAL
  Filled 2016-09-24 (×3): qty 1

## 2016-09-24 MED ORDER — HYDRALAZINE HCL 20 MG/ML IJ SOLN
10.0000 mg | Freq: Once | INTRAMUSCULAR | Status: AC
  Administered 2016-09-24: 10 mg via INTRAVENOUS
  Filled 2016-09-24: qty 1

## 2016-09-24 MED ORDER — EZETIMIBE 10 MG PO TABS
10.0000 mg | ORAL_TABLET | Freq: Every day | ORAL | Status: DC
  Administered 2016-09-24 – 2016-09-26 (×3): 10 mg via ORAL
  Filled 2016-09-24 (×3): qty 1

## 2016-09-24 MED ORDER — HYDRALAZINE HCL 20 MG/ML IJ SOLN: 10.0000 mg | Freq: Four times a day (QID) | INTRAMUSCULAR | Status: DC | PRN

## 2016-09-24 MED ORDER — AMLODIPINE BESYLATE 5 MG PO TABS
5.0000 mg | ORAL_TABLET | Freq: Two times a day (BID) | ORAL | Status: DC
  Administered 2016-09-24 – 2016-09-26 (×5): 5 mg via ORAL
  Filled 2016-09-24 (×5): qty 1

## 2016-09-24 MED ORDER — CLONIDINE HCL 0.1 MG PO TABS
0.2000 mg | ORAL_TABLET | Freq: Two times a day (BID) | ORAL | Status: DC
  Administered 2016-09-24 – 2016-09-25 (×3): 0.2 mg via ORAL
  Filled 2016-09-24 (×3): qty 2

## 2016-09-24 MED ORDER — IOPAMIDOL (ISOVUE-370) INJECTION 76%
INTRAVENOUS | Status: AC
  Administered 2016-09-24: 50 mL
  Filled 2016-09-24: qty 50

## 2016-09-24 MED ORDER — HYDRALAZINE HCL 20 MG/ML IJ SOLN: 10.0000 mg | Freq: Once | INTRAMUSCULAR | Status: DC

## 2016-09-24 MED ORDER — LISINOPRIL 20 MG PO TABS
40.0000 mg | ORAL_TABLET | Freq: Two times a day (BID) | ORAL | Status: DC
  Administered 2016-09-24 – 2016-09-26 (×5): 40 mg via ORAL
  Filled 2016-09-24 (×5): qty 2

## 2016-09-24 NOTE — Consult Note (Signed)
NEURO HOSPITALIST CONSULT NOTE   Requestig physician: Dr. Mahala MenghiniSamtani  Reason for Consult: Subacute stroke  History obtained from:   Patient and Chart     HPI:                                                                                                                                          Charlene Greene is an 82 y.o. female who presented to the ED with a chief complaint of left sided weakness for 3-4 days in conjunction with difficulty ambulating. Also with some difficulty naiming things. At baseline she is high functioning with no memory deficits. [facility] revealed right basal ganglia hypodensity suggestive of subacute ischemic infarction.   Her PMHx includes HTN, HLD, CAD, pacemaker, DM2 and depression  Past Medical History:  Diagnosis Date  . Diabetes mellitus without complication (HCC)   . Hypertension   . Pacemaker     Past Surgical History:  Procedure Laterality Date  . PACEMAKER PLACEMENT      No family history on file.  Social History:  reports that she has never smoked. She has never used smokeless tobacco. She reports that she does not drink alcohol. Her drug history is not on file.  No Known Allergies  MEDICATIONS:                                                                                                                      Current Facility-Administered Medications:  .  0.9 %  sodium chloride infusion, , Intravenous, Continuous, Rhetta MuraJai-Gurmukh Samtani, MD, Last Rate: 50 mL/hr at 09/23/16 2357 .  aspirin suppository 300 mg, 300 mg, Rectal, Daily **OR** aspirin tablet 325 mg, 325 mg, Oral, Daily, Rhetta MuraJai-Gurmukh Samtani, MD .  busPIRone (BUSPAR) tablet 5 mg, 5 mg, Oral, BID, Rhetta MuraJai-Gurmukh Samtani, MD, 5 mg at 09/23/16 2343 .  enoxaparin (LOVENOX) injection 40 mg, 40 mg, Subcutaneous, Q24H, Rhetta MuraJai-Gurmukh Samtani, MD, 40 mg at 09/23/16 1846 .  fenofibrate tablet 160 mg, 160 mg, Oral, Daily, Rhetta MuraJai-Gurmukh Samtani, MD .  hydrALAZINE  (APRESOLINE) injection 10 mg, 10 mg, Intravenous, Once, Leanne ChangKatherine P Schorr, NP .  isosorbide mononitrate (IMDUR) 24 hr tablet 30 mg, 30 mg, Oral, Daily, Rhetta MuraJai-Gurmukh Samtani, MD .  Melatonin TABS 3 mg, 3 mg, Oral, QHS,  Rhetta MuraJai-Gurmukh Samtani, MD, 3 mg at 09/23/16 2342 .  metoprolol succinate (TOPROL-XL) 24 hr tablet 50 mg, 50 mg, Oral, Daily, Rhetta MuraJai-Gurmukh Samtani, MD, 50 mg at 09/23/16 2343 .  niacin tablet 250 mg, 250 mg, Oral, Erroll [id number]-[id number], Michael A Maccia, RPH .  pregabalin (LYRICA) capsule 75 mg, 75 mg, Oral, BID, Rhetta MuraJai-Gurmukh Samtani, MD, 75 mg at 09/23/16 2341 .  ranolazine (RANEXA) 12 hr tablet 500 mg, 500 mg, Oral, BID, Rhetta MuraJai-Gurmukh Samtani, MD, 500 mg at 09/23/16 2343  ROS:                                                                                                                                       History obtained from patient. Denies current symptoms of headache, fever, chest pain or abdominal pain. States she has some joint pain which is chronic.   Blood pressure (!) 193/69, pulse 69, temperature 97.5 F (36.4 C), temperature source Oral, resp. rate 18, height 5\' 2"  (1.575 m), weight 83 kg (183 lb), SpO2 (!) 89 %.   General Examination:                                                                                                      HEENT-  Normocephalic/atraumatic.  Lungs- No gross wheezes, respirations unlabored Extremities- Warm and well perfused  Neurological Examination Mental Status: Alert and fully oriented, thought content appropriate.  Speech fluent without evidence of aphasia.  Able to follow all commands without difficulty. Cranial Nerves: II: Visual fields intact, pupils equal, round and reactive to light III,IV, VI: extra-ocular motions intact without nystagmus V,VII: smile symmetric, facial temperature sensation normal bilaterally VIII: hearing intact to conversation IX,X: no hypophonia or hoarseness XI: symmetric XII: midline tongue extension Motor: Right  : Upper extremity   5/5   Left:     Upper extremity   4/5 proximal and distal  Lower extremity   5/5    Lower extremity   4/5 proximal and distal Normal tone throughout; no atrophy noted Sensory: Temperature and light touch intact x 4 without extinction Deep Tendon Reflexes: 1+ biceps and brachioradialis bilaterally, 0 achilles and patellae bilaterally Cerebellar: No ataxia on FNF.  normal finger-to-nose, normal rapid alternating movements and normal heel-to-shin test Gait: Deferred   Lab Results: Basic Metabolic Panel:  Recent Labs Lab 09/23/16 1136 09/23/16 1650  NA 138  --   K 4.5  --   CL 100*  --   CO2 29  --   GLUCOSE 262*  --  BUN 22*  --   CREATININE 1.21* 1.12*  CALCIUM 9.1  --     Liver Function Tests:  Recent Labs Lab 09/23/16 1136  AST 35  ALT 28  ALKPHOS 41  BILITOT 0.7  PROT 6.5  ALBUMIN 4.0   No results for input(s): LIPASE, AMYLASE in the last 168 hours. No results for input(s): AMMONIA in the last 168 hours.  CBC:  Recent Labs Lab 09/23/16 1136 09/23/16 1650  WBC 9.6 7.2  HGB 13.8 14.2  HCT 40.5 43.1  MCV 86.4 87.6  PLT 248 242    Cardiac Enzymes:  Recent Labs Lab 09/23/16 1136  TROPONINI <0.03    Lipid Panel: No results for input(s): CHOL, TRIG, HDL, CHOLHDL, VLDL, LDLCALC in the last 168 hours.  CBG:  Recent Labs Lab 09/23/16 1145  GLUCAP 257*    Microbiology: No results found for this or any previous visit.  Coagulation Studies: No results for input(s): LABPROT, INR in the last 72 hours.  Imaging: Ct Head Wo Contrast  Result Date: 09/23/2016 CLINICAL DATA:  Mental status deterioration over the last week. EXAM: CT HEAD WITHOUT CONTRAST TECHNIQUE: Contiguous axial images were obtained from the base of the skull through the vertex without intravenous contrast. COMPARISON:  None. FINDINGS: Brain: The brain shows generalized atrophy. There are moderate chronic appearing small vessel ischemic changes affecting the  cerebral hemispheric white matter. There is an abnormal pattern in the right basal gangliar region which could relate to old and/or recent infarction. However, some characteristics suggest the possibility of vasogenic edema. Therefore, MRI would be suggested if the patient is a candidate. No evidence of hydrocephalus or extra-axial collection. Vascular: There is atherosclerotic calcification of the major vessels at the base of the brain. Skull: Negative Sinuses/Orbits: Clear/normal Other: None significant IMPRESSION: Atrophy an extensive chronic small vessel ischemic changes. Abnormal appearance of the right basal gangliar region that could represent a combination of old and acute infarction. However, the pattern is somewhat unusual, raising the possibility of vasogenic edema. MRI would be suggested if the patient is a candidate. Electronically Signed   By: Paulina Fusi M.D.   On: 09/23/2016 12:51   Dg Chest Port 1 View  Result Date: 09/23/2016 CLINICAL DATA:  Slurred speech and confusion. EXAM: PORTABLE CHEST 1 VIEW COMPARISON:  None. FINDINGS: No pneumothorax. Cardiomegaly. The hila and mediastinum are unremarkable. No pulmonary nodules or masses. Mild left basilar atelectasis. No suspicious infiltrate. Chronic changes in the shoulders, left worse than right. IMPRESSION: No active disease. Electronically Signed   By: Gerome Sam III M.D   On: 09/23/2016 12:55    Assessment: 1. Subacute right basal ganglion ischemic infarction. DDx includes cardioembolic stroke, artery to artery embolization and in situ thrombosis. Was on ASA and has CAD, therefore will need to be escalated to DAPT. 2. CT also reveals chronic small vessel ischemic changes.   Recommendations: 1. Unable to perform MRI due to pacemaker.  2. Out of permissive HTN time window. Manage BP as per standard protocol.  3. Add Plavix to ASA regimen.  4. Given her age overall benefit of statin therapy for stroke prevention likely outweighed  by risks. .  5. CTA of head and neck. 6. TTE.  7. PT/OT/Speech.   Electronically signed: Dr. Caryl Pina 09/24/2016, 2:56 AM

## 2016-09-24 NOTE — Progress Notes (Signed)
Inpatient Diabetes Program Recommendations  AACE/ADA: New Consensus Statement on Inpatient Glycemic Control (2015)  Target Ranges:  Prepandial:   less than 140 mg/dL      Peak postprandial:   less than 180 mg/dL (1-2 hours)      Critically ill patients:  140 - 180 mg/dL   Results for [name], Zakyia (MRN [id number]) as of 09/24/2016 08:15  Ref. Range 09/23/2016 11:45  Glucose-Capillary Latest Ref Range: 65 - 99 mg/dL 956257 (H)  Results for [name], Janki (MRN [id number]) as of 09/24/2016 08:15  Ref. Range 09/23/2016 11:36  Glucose Latest Ref Range: 65 - 99 mg/dL 469262 (H)   Review of Glycemic Control  Diabetes history: DM2 Outpatient Diabetes medications: Amaryl 2 mg QAM, Amaryl 1 mg QHS Current orders for Inpatient glycemic control: None  Inpatient Diabetes Program Recommendations Correction (SSI): Please order CBGs with Novolog correction scale ACHS. HgbA1C: Please consider ordering an A1C to evaluate glycemic control over the past 2-3 months.  Thanks, Orlando PennerMarie Horst Ostermiller, RN, MSN, CDE Diabetes Coordinator Inpatient Diabetes Program [phone] (Team Pager from 8am to 5pm)

## 2016-09-24 NOTE — Progress Notes (Signed)
   09/24/16 0556  Vitals  Temp 98.2 F (36.8 C)  Temp Source Oral  BP (!) 148/86  BP Location Left Arm  BP Method Automatic  Patient Position (if appropriate) Lying  Pulse Rate 71  Pulse Rate Source Dinamap  Resp 20  Oxygen Therapy  SpO2 97 %  O2 Device Nasal Cannula  O2 Flow Rate (L/min) 2 L/min   Patient received 10 mg Labetolol IV at 0529. RN will continue to monitor.

## 2016-09-24 NOTE — Progress Notes (Signed)
Patient O2 sat 78-88%, deep breathing exercises completed with little to no increase. Patient placed on 2L oxygen [place] at 0218. Patient O2 sat sustaining 97-99%. Patient experiences SOB with exertion. Oncoming RN notified and will continue to monitor.

## 2016-09-24 NOTE — Progress Notes (Signed)
PT Cancellation Note  Patient Details Name: Charlene Greene MRN: [id number] DOB: [birth year]   Cancelled Treatment:    Reason Eval/Treat Not Completed: Patient unavailable. Pt currently in with MD who requests PT return at a later time. Will check back as schedule allows to complete PT eval.    Conni SlipperKirkman, Elianna Windom 09/24/2016, 10:17 AM   Conni SlipperLaura Fitzhugh Vizcarrondo, PT, DPT Acute Rehabilitation Services Pager: [phone]

## 2016-09-24 NOTE — Progress Notes (Signed)
[facility]                                                                              Patient Demographics  Charlene Greene, is a 82 y.o. female, DOB - [birth year], NFA:[id number]  Admit date - 09/23/2016   Admitting Physician Rhetta MuraJai-Gurmukh Samtani, MD  Outpatient Primary MD for the patient is No primary care provider on file.  Outpatient specialists:   LOS - 1  days    Chief Complaint  Patient presents with  . Altered Mental Status       Brief summary   Patient is a 82 year old female with history of hypertension, hyperlipidemia, sick sinus syndrome status post pacemaker, prior CAD, diabetes mellitus who presented to ED with left-sided weakness for past 3-4 days and slurred speech, facial drooping. CT head showed abnormal appearance of right basal ganglia region combination of old an acute infarction. [facility] work[facility].    Assessment & Plan    Principal Problem:   CVA (cerebral vascular accident) (HCC) - CT head showed abnormal appearance of right basal ganglia region combination of old an acute infarction. - Unable to get MRI due to pacemaker - Neurology consulted, recommended CTA of the head and neck - CTA head and neck showed acute to subacute small vessel ischemia in the right basal ganglia. Extensive intracranial at resources including moderate to severe stenosis right PCA P2 and distal segments mild to moderate stenosis of the bilateral ICAs symptoms distal right vertebral artery and left PCA. - Follow 2-D echo - Severe hyperlipidemia, patient unable to tolerate statins, per family. She has not used Zetia before per daughter-in-law. Will try Zetia, will dc if patient has any side effect of myalgias,  - Follow A1c, PTOT evaluation, ST  Active Problems:   Accelerated hypertension - Patient on multiple antihypertensives at home amlodipine, clonidine, Imdur, lisinopril, metoprolol - restart oral antihypertensives,  IV hydralazine with parameters    Diabetes mellitus (HCC) - Follow hemoglobin A1c    Hyperlipidemia - Uncontrolled, patient has not been tolerant of statins in the past - Continue gemfibrozil, will try Zetia    Sick sinus syndrome (HCC) - Has pacemaker  Code Status: dnr  DVT Prophylaxis:  Lovenox  Family Communication: Discussed in detail with the patient, all imaging results, lab results explained to the patient    Disposition Plan:   Time Spent in minutes  25 minutes  Procedures:  CTA head and neck  Consultants:   Neurology  Antimicrobials:      Medications  Scheduled Meds: . amLODipine  5 mg Oral BID  . aspirin  300 mg Rectal Daily   Or  . aspirin  325 mg Oral Daily  . busPIRone  5 mg Oral BID  . cloNIDine  0.2 mg Oral BID  . enoxaparin (LOVENOX) injection  40 mg Subcutaneous Q24H  . fenofibrate  160 mg Oral Daily  . hydrALAZINE  10 mg Intravenous Once  . isosorbide mononitrate  30 mg Oral Daily  . lisinopril  40 mg Oral BID  . Melatonin  3 mg Oral QHS  . metoprolol succinate  50  mg Oral Daily  . niacin  250 mg Oral BH-q7a  . pregabalin  75 mg Oral BID  . ranolazine  500 mg Oral BID   Continuous Infusions: . sodium chloride 50 mL/hr at 09/23/16 2357   PRN Meds:.hydrALAZINE   Antibiotics   Anti-infectives    None        Subjective:   Charlene Griffinsell Arp was seen and examined today.   Patient denies dizziness, chest pain, shortness of breath, abdominal pain, N/V/D/C, new weakness, numbess, tingling. No acute events overnight.    Objective:   Vitals:   09/24/16 0540 09/24/16 0556 09/24/16 1035 09/24/16 1054  BP: (!) 162/70 (!) 148/86  (!) 216/107  Pulse: 65 71 76 80  Resp:  20 17   Temp:  98.2 F (36.8 C) 98.8 F (37.1 C)   TempSrc:  Oral Oral   SpO2:  97% 96%   Weight:      Height:        Intake/Output Summary (Last 24 hours) at 09/24/16 1320 Last data filed at 09/24/16 0600  Gross per 24 hour  Intake              360 ml  Output               650 ml  Net             -290 ml     Wt Readings from Last 3 Encounters:  09/23/16 83 kg (183 lb)     Exam  General: Alert and oriented x 3, NAD  HEENT:  PERRLA, EOMI, Anicteric Sclera, mucous membranes moist.   Neck: Supple, no JVD, no masses  Cardiovascular: S1 S2 auscultated, no rubs, murmurs or gallops. Regular rate and rhythm.  Respiratory: Clear to auscultation bilaterally, no wheezing, rales or rhonchi  Gastrointestinal: Soft, nontender, nondistended, + bowel sounds  Ext: no cyanosis clubbing or edema  Neuro:Speech fluent, Right 5/5 upper and lower extremity. Left upper and lower ext 4/5   Skin: No rashes  Psych: Normal affect and demeanor, alert and oriented x3    Data Reviewed:  I have personally reviewed following labs and imaging studies  Micro Results Recent Results (from the past 240 hour(s))  Urine culture     Status: None   Collection Time: 09/23/16  1:14 PM  Result Value Ref Range Status   Specimen Description URINE, CLEAN CATCH  Final   Special Requests NONE  Final   Culture NO GROWTH [facility]formed at [facility]   Final   Report Status 09/24/2016 FINAL  Final    Radiology Reports Ct Angio Head W Or Wo Contrast  Result Date: 09/24/2016 CLINICAL DATA:  [age] year old female with left side weakness for 3-4 days and difficulty walking. Evidence of small vessel disease on noncontrast head CT yesterday. Initial encounter. EXAM: CT ANGIOGRAPHY HEAD AND NECK TECHNIQUE: Multidetector CT imaging of the head and neck was performed using the standard protocol during bolus administration of intravenous contrast. Multiplanar CT image reconstructions and MIPs were obtained to evaluate the vascular anatomy. Carotid stenosis measurements (when applicable) are obtained utilizing NASCET criteria, using the distal internal carotid diameter as the denominator. CONTRAST:  50 mL Isovue 370 COMPARISON:  [facility]contrast head CT 09/23/2016  FINDINGS: CTA NECK Skeleton: Multilevel cervical spine degeneration. Osteopenia. Severe chronic degenerative osseous changes about both shoulders. No acute osseous abnormality identified. Visualized paranasal sinuses and mastoids are stable and well pneumatized. Upper chest: Negative lung apices ; mild dependent atelectasis and  mosaic attenuation. No superior mediastinal lymphadenopathy. Left chest cardiac pacemaker type device partially visible. Other neck: Negative thyroid with sub cm right thyroid lobe nodules which do not meet consensus criteria for ultrasound follow-up. Larynx and pharynx soft tissue contours are within normal limits. Negative parapharyngeal and retropharyngeal spaces. Negative sublingual space, submandibular glands, and parotid glands. No cervical lymphadenopathy. Aortic arch: 4 vessel arch configuration, the left vertebral artery arises directly from the arch. Moderate calcified arch atherosclerosis. Right carotid system: No brachiocephalic artery stenosis despite calcified plaque. Tortuous right CCA origin with highly kinked appearance as seen on series 8, image 129. Otherwise no proximal right carotid stenosis. Calcified plaque at the right carotid bifurcation and right ICA bulb with less than 50 % stenosis with respect to the distal vessel. Mildly tortuous cervical right ICA. Left carotid system: Normal left CCA origin. Tortuous proximal left CCA with mild calcified plaque but no stenosis. Intermittent soft and calcified plaque in the distal left CCA and at the left carotid bifurcation with no stenosis. Tortuous cervical left ICA at the C1 level. Minimal distal cervical left ICA calcified plaque. Vertebral arteries: Tortuous proximal right subclavian artery with calcified plaque but no significant stenosis. Calcified plaque near the right vertebral artery origin without significant stenosis (series 8, image 142). Right V1 segment calcified plaque without stenosis. Tortuous right V2 segment  with no vertebral artery stenosis to the skullbase. The left vertebral artery arises directly from the arch with no proximal stenosis. The proximal left vertebral is tortuous. The left vertebral is mildly non dominant. The left V2 segment is tortuous. No stenosis to the skullbase. CTA HEAD Posterior circulation: Calcified plaque in the right V4 segment with mild to moderate stenosis proximal to the right PICA origin which remains patent. The more distal right vertebral artery is normal to the vertebrobasilar junction. No distal left vertebral artery stenosis. The left PICA origin is patent. The basilar artery is patent with mild irregularity and no stenosis. Patent AICA and SCA origins. Fetal type bilateral PCA origins. Mild irregularity and stenosis in the left PCA P2 segment (series 12, image 25). Moderate to severe irregularity and stenosis in the left P3 superior division (same image). Multifocal severe irregularity and stenosis in the distal right P2 segment (series 12, image 18) and proximal right P3 branches (same image). Anterior circulation: Both ICA siphons are patent. Moderate siphon calcified plaque greater on the right. Mild left supraclinoid and mild to moderate right cavernous segment ICA stenoses. Normal ophthalmic and posterior communicating artery origins. Patent carotid termini. Normal MCA and left ACA origins. Mild irregularity and stenosis at the right ACA origin. Anterior communicating artery is within normal limits. Widespread high-grade stenosis throughout the right ACA A2 segment. Moderate to severe distal left A2 segment stenosis. See series 12, image 22). Preserved distal ACA enhancement bilaterally. Mildly irregular left MCA M1 segment. Moderate to severe irregularity and stenosis at the left MCA bifurcation affecting the anterior division more so, but also with high-grade proximal posterior M2 stenosis but preserved distal flow. See series 12, image 31. Right MCA M1 segment appears  normal. At the right MCA bifurcation there is moderate irregularity and stenosis. There is up to moderate scattered irregularity and stenosis in the right MCA M2 and distal branches. Venous sinuses: Patent. Anatomic variants: Left vertebral artery arises directly from the arch. Bilateral fetal type PCA origins. Delayed phase: Curvilinear enhancement about the heterogeneity in the right caudate nucleus. No associated mass effect. No acute intracranial hemorrhage identified. No other abnormal intracranial  enhancement. Review of the MIP images confirms the above findings IMPRESSION: 1. Negative for emergent large vessel occlusion. 2. Mild enhancement without mass effect about the right basal ganglia heterogeneity seen yesterday, suggesting acute to subacute small vessel ischemia there. Otherwise stable CT appearance of the brain. 3. Extensive intracranial atherosclerosis including moderate or severe stenoses: 4. - Right PCA P2 and distal segments. - Right greater than Left ACA A2 segments. - Left greater than Right MCA bifurcations, M2 and distal MCA branches. 5. Mild-to-moderate stenoses of the bilateral ICA siphons, distal right vertebral artery, and left PCA. 6. Mild for age extracranial atherosclerosis, with no hemodynamically significant stenosis in the neck aside from tortuous proximal right CCA with a kinked appearance. Electronically Signed   By: Odessa Fleming M.D.   On: 09/24/2016 12:32   Ct Head Wo Contrast  Result Date: 09/23/2016 CLINICAL DATA:  Mental status deterioration over the last week. EXAM: CT HEAD WITHOUT CONTRAST TECHNIQUE: Contiguous axial images were obtained from the base of the skull through the vertex without intravenous contrast. COMPARISON:  None. FINDINGS: Brain: The brain shows generalized atrophy. There are moderate chronic appearing small vessel ischemic changes affecting the cerebral hemispheric white matter. There is an abnormal pattern in the right basal gangliar region which could  relate to old and/or recent infarction. However, some characteristics suggest the possibility of vasogenic edema. Therefore, MRI would be suggested if the patient is a candidate. No evidence of hydrocephalus or extra-axial collection. Vascular: There is atherosclerotic calcification of the major vessels at the base of the brain. Skull: Negative Sinuses/Orbits: Clear/normal Other: None significant IMPRESSION: Atrophy an extensive chronic small vessel ischemic changes. Abnormal appearance of the right basal gangliar region that could represent a combination of old and acute infarction. However, the pattern is somewhat unusual, raising the possibility of vasogenic edema. MRI would be suggested if the patient is a candidate. Electronically Signed   By: Paulina Fusi M.D.   On: 09/23/2016 12:51   Ct Angio Neck W Or Wo Contrast  Result Date: 09/24/2016 CLINICAL DATA:  82 year old female with left side weakness for 3-4 days and difficulty walking. Evidence of small vessel disease on noncontrast head CT yesterday. Initial encounter. EXAM: CT ANGIOGRAPHY HEAD AND NECK TECHNIQUE: Multidetector CT imaging of the head and neck was performed using the standard protocol during bolus administration of intravenous contrast. Multiplanar CT image reconstructions and MIPs were obtained to evaluate the vascular anatomy. Carotid stenosis measurements (when applicable) are obtained utilizing NASCET criteria, using the distal internal carotid diameter as the denominator. CONTRAST:  50 mL Isovue 370 COMPARISON:  [facility] noncontrast head CT 09/23/2016 FINDINGS: CTA NECK Skeleton: Multilevel cervical spine degeneration. Osteopenia. Severe chronic degenerative osseous changes about both shoulders. No acute osseous abnormality identified. Visualized paranasal sinuses and mastoids are stable and well pneumatized. Upper chest: Negative lung apices ; mild dependent atelectasis and mosaic attenuation. No superior mediastinal  lymphadenopathy. Left chest cardiac pacemaker type device partially visible. Other neck: Negative thyroid with sub cm right thyroid lobe nodules which do not meet consensus criteria for ultrasound follow-up. Larynx and pharynx soft tissue contours are within normal limits. Negative parapharyngeal and retropharyngeal spaces. Negative sublingual space, submandibular glands, and parotid glands. No cervical lymphadenopathy. Aortic arch: 4 vessel arch configuration, the left vertebral artery arises directly from the arch. Moderate calcified arch atherosclerosis. Right carotid system: No brachiocephalic artery stenosis despite calcified plaque. Tortuous right CCA origin with highly kinked appearance as seen on series 8, image 129. Otherwise no  proximal right carotid stenosis. Calcified plaque at the right carotid bifurcation and right ICA bulb with less than 50 % stenosis with respect to the distal vessel. Mildly tortuous cervical right ICA. Left carotid system: Normal left CCA origin. Tortuous proximal left CCA with mild calcified plaque but no stenosis. Intermittent soft and calcified plaque in the distal left CCA and at the left carotid bifurcation with no stenosis. Tortuous cervical left ICA at the C1 level. Minimal distal cervical left ICA calcified plaque. Vertebral arteries: Tortuous proximal right subclavian artery with calcified plaque but no significant stenosis. Calcified plaque near the right vertebral artery origin without significant stenosis (series 8, image 142). Right V1 segment calcified plaque without stenosis. Tortuous right V2 segment with no vertebral artery stenosis to the skullbase. The left vertebral artery arises directly from the arch with no proximal stenosis. The proximal left vertebral is tortuous. The left vertebral is mildly non dominant. The left V2 segment is tortuous. No stenosis to the skullbase. CTA HEAD Posterior circulation: Calcified plaque in the right V4 segment with mild to  moderate stenosis proximal to the right PICA origin which remains patent. The more distal right vertebral artery is normal to the vertebrobasilar junction. No distal left vertebral artery stenosis. The left PICA origin is patent. The basilar artery is patent with mild irregularity and no stenosis. Patent AICA and SCA origins. Fetal type bilateral PCA origins. Mild irregularity and stenosis in the left PCA P2 segment (series 12, image 25). Moderate to severe irregularity and stenosis in the left P3 superior division (same image). Multifocal severe irregularity and stenosis in the distal right P2 segment (series 12, image 18) and proximal right P3 branches (same image). Anterior circulation: Both ICA siphons are patent. Moderate siphon calcified plaque greater on the right. Mild left supraclinoid and mild to moderate right cavernous segment ICA stenoses. Normal ophthalmic and posterior communicating artery origins. Patent carotid termini. Normal MCA and left ACA origins. Mild irregularity and stenosis at the right ACA origin. Anterior communicating artery is within normal limits. Widespread high-grade stenosis throughout the right ACA A2 segment. Moderate to severe distal left A2 segment stenosis. See series 12, image 22). Preserved distal ACA enhancement bilaterally. Mildly irregular left MCA M1 segment. Moderate to severe irregularity and stenosis at the left MCA bifurcation affecting the anterior division more so, but also with high-grade proximal posterior M2 stenosis but preserved distal flow. See series 12, image 31. Right MCA M1 segment appears normal. At the right MCA bifurcation there is moderate irregularity and stenosis. There is up to moderate scattered irregularity and stenosis in the right MCA M2 and distal branches. Venous sinuses: Patent. Anatomic variants: Left vertebral artery arises directly from the arch. Bilateral fetal type PCA origins. Delayed phase: Curvilinear enhancement about the  heterogeneity in the right caudate nucleus. No associated mass effect. No acute intracranial hemorrhage identified. No other abnormal intracranial enhancement. Review of the MIP images confirms the above findings IMPRESSION: 1. Negative for emergent large vessel occlusion. 2. Mild enhancement without mass effect about the right basal ganglia heterogeneity seen yesterday, suggesting acute to subacute small vessel ischemia there. Otherwise stable CT appearance of the brain. 3. Extensive intracranial atherosclerosis including moderate or severe stenoses: 4. - Right PCA P2 and distal segments. - Right greater than Left ACA A2 segments. - Left greater than Right MCA bifurcations, M2 and distal MCA branches. 5. Mild-to-moderate stenoses of the bilateral ICA siphons, distal right vertebral artery, and left PCA. 6. Mild for age extracranial atherosclerosis, with  no hemodynamically significant stenosis in the neck aside from tortuous proximal right CCA with a kinked appearance. Electronically Signed   By: Odessa Fleming M.D.   On: 09/24/2016 12:32   Dg Chest Port 1 View  Result Date: 09/23/2016 CLINICAL DATA:  Slurred speech and confusion. EXAM: PORTABLE CHEST 1 VIEW COMPARISON:  None. FINDINGS: No pneumothorax. Cardiomegaly. The hila and mediastinum are unremarkable. No pulmonary nodules or masses. Mild left basilar atelectasis. No suspicious infiltrate. Chronic changes in the shoulders, left worse than right. IMPRESSION: No active disease. Electronically Signed   By: Gerome Sam III M.D   On: 09/23/2016 12:55    Lab Data:  CBC:  Recent Labs Lab 09/23/16 1136 09/23/16 1650  WBC 9.6 7.2  HGB 13.8 14.2  HCT 40.5 43.1  MCV 86.4 87.6  PLT 248 242   Basic Metabolic Panel:  Recent Labs Lab 09/23/16 1136 09/23/16 1650 09/24/16 0749  NA 138  --  142  K 4.5  --  3.9  CL 100*  --  103  CO2 29  --  26  GLUCOSE 262*  --  138*  BUN 22*  --  16  CREATININE 1.21* 1.12* 1.07*  CALCIUM 9.1  --  9.1    GFR: Estimated Creatinine Clearance: 33.5 mL/min (by C-G formula based on SCr of 1.07 mg/dL (H)). Liver Function Tests:  Recent Labs Lab 09/23/16 1136  AST 35  ALT 28  ALKPHOS 41  BILITOT 0.7  PROT 6.5  ALBUMIN 4.0   No results for input(s): LIPASE, AMYLASE in the last 168 hours. No results for input(s): AMMONIA in the last 168 hours. Coagulation Profile: No results for input(s): INR, PROTIME in the last 168 hours. Cardiac Enzymes:  Recent Labs Lab 09/23/16 1136  TROPONINI <0.03   BNP (last 3 results) No results for input(s): PROBNP in the last 8760 hours. HbA1C: No results for input(s): HGBA1C in the last 72 hours. CBG:  Recent Labs Lab 09/23/16 1145 09/23/16 2131  GLUCAP 257* 173*   Lipid Profile:  Recent Labs  09/24/16 0528  CHOL 274*  HDL 32*  LDLCALC UNABLE TO CALCULATE IF TRIGLYCERIDE OVER 400 mg/dL  TRIG 161*  CHOLHDL 8.6   Thyroid Function Tests: No results for input(s): TSH, T4TOTAL, FREET4, T3FREE, THYROIDAB in the last 72 hours. Anemia Panel: No results for input(s): VITAMINB12, FOLATE, FERRITIN, TIBC, IRON, RETICCTPCT in the last 72 hours. Urine analysis:    Component Value Date/Time   COLORURINE YELLOW 09/23/2016 1314   APPEARANCEUR CLEAR 09/23/2016 1314   LABSPEC 1.014 09/23/2016 1314   PHURINE 6.5 09/23/2016 1314   GLUCOSEU 250 (A) 09/23/2016 1314   HGBUR NEGATIVE 09/23/2016 1314   BILIRUBINUR NEGATIVE 09/23/2016 1314   KETONESUR NEGATIVE 09/23/2016 1314   PROTEINUR NEGATIVE 09/23/2016 1314   NITRITE NEGATIVE 09/23/2016 1314   LEUKOCYTESUR NEGATIVE 09/23/2016 1314     RAI,RIPUDEEP M.D. [facility] 09/24/2016, 1:20 PM  Pager: [phone] Between 7am to 7pm - call Pager - [phone]  After 7pm go to [email or web address] - password [email or web address]  Call night coverage person covering after 7pm

## 2016-09-24 NOTE — Progress Notes (Signed)
STROKE TEAM PROGRESS NOTE   HISTORY OF PRESENT ILLNESS (per record) Charlene Greene is an 82 y.o. female who presented to the ED with a chief complaint of left sided weakness for 3-4 days in conjunction with difficulty ambulating. Also with some difficulty naming things. At baseline she is high functioning with no memory deficits. [facility] basal ganglia hypodensity suggestive of subacute ischemic infarction.   Her PMHx includes HTN, HLD, CAD, pacemaker, DM2 and depression  Patient was not administered IV t-PA. She was admitted for further evaluation and treatment.   SUBJECTIVE (INTERVAL HISTORY) Her [facility]  is at the bedside.  Overall she feels her condition is stable.    OBJECTIVE Temp:  [97.5 F (36.4 C)-98.6 F (37 C)] 98.2 F (36.8 C) (11/13 0556) Pulse Rate:  [53-78] 71 (11/13 0556) Cardiac Rhythm: Atrial paced (11/13 0700) Resp:  [17-28] 20 (11/13 0556) BP: (144-213)/(61-98) 148/86 (11/13 0556) SpO2:  [89 %-99 %] 97 % (11/13 0556) Weight:  [83 kg (183 lb)] 83 kg (183 lb) (11/12 1746)  CBC:   Recent Labs Lab 09/23/16 1136 09/23/16 1650  WBC 9.6 7.2  HGB 13.8 14.2  HCT 40.5 43.1  MCV 86.4 87.6  PLT 248 242    Basic Metabolic Panel:   Recent Labs Lab 09/23/16 1136 09/23/16 1650 09/24/16 0749  NA 138  --  142  K 4.5  --  3.9  CL 100*  --  103  CO2 29  --  26  GLUCOSE 262*  --  138*  BUN 22*  --  16  CREATININE 1.21* 1.12* 1.07*  CALCIUM 9.1  --  9.1    Lipid Panel:     Component Value Date/Time   CHOL 274 (H) 09/24/2016 0528   TRIG 530 (H) 09/24/2016 0528   HDL 32 (L) 09/24/2016 0528   CHOLHDL 8.6 09/24/2016 0528   VLDL UNABLE TO CALCULATE IF TRIGLYCERIDE OVER 400 mg/dL 82/95/621311/13/2017 [id number]   LDLCALC UNABLE TO CALCULATE IF TRIGLYCERIDE OVER 400 mg/dL 78/46/962911/13/2017 52840528   XLKG4WHgbA1c: No results found for: HGBA1C Urine Drug Screen: No results found for: LABOPIA, COCAINSCRNUR, LABBENZ, AMPHETMU, THCU, LABBARB     IMAGING  Ct Head Wo Contrast  Result Date: 09/23/2016 CLINICAL DATA:  Mental status deterioration over the last week. EXAM: CT HEAD WITHOUT CONTRAST TECHNIQUE: Contiguous axial images were obtained from the base of the skull through the vertex without intravenous contrast. COMPARISON:  None. FINDINGS: Brain: The brain shows generalized atrophy. There are moderate chronic appearing small vessel ischemic changes affecting the cerebral hemispheric white matter. There is an abnormal pattern in the right basal gangliar region which could relate to old and/or recent infarction. However, some characteristics suggest the possibility of vasogenic edema. Therefore, MRI would be suggested if the patient is a candidate. No evidence of hydrocephalus or extra-axial collection. Vascular: There is atherosclerotic calcification of the major vessels at the base of the brain. Skull: Negative Sinuses/Orbits: Clear/normal Other: None significant IMPRESSION: Atrophy an extensive chronic small vessel ischemic changes. Abnormal appearance of the right basal gangliar region that could represent a combination of old and acute infarction. However, the pattern is somewhat unusual, raising the possibility of vasogenic edema. MRI would be suggested if the patient is a candidate. Electronically Signed   By: Paulina FusiMark  Shogry M.D.   On: 09/23/2016 12:51   Dg Chest Port 1 View  Result Date: 09/23/2016 CLINICAL DATA:  Slurred speech and confusion. EXAM: PORTABLE CHEST 1 VIEW COMPARISON:  None. FINDINGS: No pneumothorax. Cardiomegaly. The hila and mediastinum are unremarkable. No pulmonary nodules or masses. Mild left basilar atelectasis. No suspicious infiltrate. Chronic changes in the shoulders, left worse than right. IMPRESSION: No active disease. Electronically Signed   By: Gerome Samavid  Williams III M.D   On: 09/23/2016 12:55    PHYSICAL EXAM Pleasant elderly female not in distress. . Afebrile. Head is nontraumatic. Neck is supple without  bruit.    Cardiac exam no murmur or gallop. Lungs are clear to auscultation. Distal pulses are well felt. Neurological Exam :  Awake alert oriented to time place and person. Slightly diminished attentionand recall. No aphasia or dysarthria. Follows commands well. Pupils irregular sluggishly reactive. Fundi were not visualized. Vision acuity and fields seem adequate. Mild left lower facial weakness. Tongue midline. Mild left hemiparesis/5 strength with diminished fine finger movements on the left and orbits right over left upper extremity. Mild weakness of left grip. Minimum weakness of left hip flexors and ankle dorsiflexors. Tone is symmetric. Sensation is intact. Coordination is slow but accurate. Gait was not tested. ASSESSMENT/PLAN Ms. Charlene Greene is a 82 y.o. female with history of HTN, HLD, CAD, pacemaker, DM2 and depression presenting with L sided weakness for 3- days with difficulty ambulating. She did not receive IV t-PA due to delay in arrival.   Stroke:  Non-dominant right basal ganglia infarct- acute plus chronic components secondary to small vessel disease    MRI  / MRA  pacer  CTA head and neck pending   2D Echo  pending   LDL unable to calculate  HgbA1c pending  Lovenox 40 mg sq daily for VTE prophylaxis Diet Heart Room service appropriate? Yes; Fluid consistency: Thin  aspirin 325 mg daily prior to admission, now on aspirin 325 mg daily. Consider change to plavix   Patient counseled to be compliant with her antithrombotic medications  Ongoing aggressive stroke risk factor management  Therapy recommendations:  pending   Disposition:  pending  ([facility])  Essential Hypertension  BP remains high 216/107 this am  Restart home medications   Permissive hypertension (OK if < 220/120) but gradually normalize in 5-7 days  Long-term BP goal normotensive  Hyperlipidemia  Home meds:  No statin  LDL unable to calculate, goal < 70  Unable to tolerate  statins (myalgia)  Diabetes type II  HgbA1c pending , goal < 7.0  Other Stroke Risk Factors  Advanced age  Obesity, Body mass index is 33.47 kg/m., recommend weight loss, diet and exercise as appropriate   Coronary artery disease  Other Active Problems  SSS s/p pacer - have asked cardiology to interrogate  Depression  Mild volume depletion  Hospital day # 1  Rhoderick MoodyBIBY,SHARON  [facility] See Amion for Pager information 09/24/2016 11:25 AM  I have personally examined this patient, reviewed notes, independently viewed imaging studies, participated in medical decision making and plan of care.ROS completed by me personally and pertinent positives fully documented  I have made any additions or clarifications directly to the above note. Agree with note above.  Patient has presented with left-sided weakness and difficulty walking and brain imaging shows a subacute right basal ganglia infarct. Recommend change aspirin to Plavix for stroke prevention and check CT angiogram of the brain and neck. Discussion with daughter at the bedside and answered questions. Greater than 50% time during this 35 minute visit was spent on counseling and coordination of care about stroke risk, prevention and treatment  Delia HeadyPramod Sethi, MD Medical Director  [facility] Pager: [phone] 09/24/2016 2:16 PM  To contact Stroke Continuity provider, please refer to [email or web address]. After hours, contact General Neurology

## 2016-09-24 NOTE — Progress Notes (Signed)
  Echocardiogram 2D Echocardiogram has been performed.  Janalyn HarderWest, Cristle Jared R 09/24/2016, 4:30 PM

## 2016-09-24 NOTE — Progress Notes (Signed)
OT Cancellation Note  Patient Details Name: Charlene Greene MRN: [id number] DOB: 1922/05/05   Cancelled Treatment:    Reason Eval/Treat Not Completed: Patient declined, no reason specified Pt currently with Dr Isidoro Donningai and requesting to return at another time. OT to check back at the next most appropriate time. RN aware   Felecia ShellingJones, Zakar Brosch B   Rasool Rommel, Brynn   OTR/L Pager: [phone] Office: [phone] .  09/24/2016, 8:41 AM

## 2016-09-24 NOTE — Progress Notes (Signed)
[facility] oncall paged.

## 2016-09-25 LAB — GLUCOSE, CAPILLARY
GLUCOSE-CAPILLARY: 216 mg/dL — AB (ref 65–99)
GLUCOSE-CAPILLARY: 177 mg/dL — AB (ref 65–99)
Glucose-Capillary: 145 mg/dL — ABNORMAL HIGH (ref 65–99)

## 2016-09-25 LAB — HEMOGLOBIN A1C
Hgb A1c MFr Bld: 7.7 % — ABNORMAL HIGH (ref 4.8–5.6)
Mean Plasma Glucose: 174 mg/dL

## 2016-09-25 MED ORDER — INSULIN ASPART 100 UNIT/ML ~~LOC~~ SOLN
0.0000 [IU] | Freq: Three times a day (TID) | SUBCUTANEOUS | Status: DC
  Administered 2016-09-25: 2 [IU] via SUBCUTANEOUS
  Administered 2016-09-25 – 2016-09-26 (×2): 3 [IU] via SUBCUTANEOUS
  Administered 2016-09-26: 2 [IU] via SUBCUTANEOUS
  Administered 2016-09-26: 1 [IU] via SUBCUTANEOUS

## 2016-09-25 MED ORDER — CLONIDINE HCL 0.1 MG PO TABS
0.2000 mg | ORAL_TABLET | Freq: Three times a day (TID) | ORAL | Status: DC
  Administered 2016-09-25 – 2016-09-26 (×2): 0.2 mg via ORAL
  Filled 2016-09-25 (×2): qty 2

## 2016-09-25 MED ORDER — INSULIN ASPART 100 UNIT/ML ~~LOC~~ SOLN: 0.0000 [IU] | Freq: Every day | SUBCUTANEOUS | Status: DC

## 2016-09-25 NOTE — Progress Notes (Signed)
[facility]                                                                              Patient Demographics  Charlene Greene, is a 82 y.o. female, DOB - 06/10/1922, ZOX:[id number]  Admit date - 09/23/2016   Admitting Physician Rhetta Mura, MD  Outpatient Primary MD for the patient is No primary care provider on file.  Outpatient specialists:   LOS - 2  days    Chief Complaint  Patient presents with  . Altered Mental Status       Brief summary   Patient is a 82 year old female with history of hypertension, hyperlipidemia, sick sinus syndrome status post pacemaker, prior CAD, diabetes mellitus who presented to ED with left-sided weakness for past 3-4 days and slurred speech, facial drooping. CT head showed abnormal appearance of right basal ganglia region combination of old an acute infarction. Patient was transferred to [facility] for full stroke workup.    Assessment & Plan    Principal Problem:   CVA (cerebral vascular accident) (HCC) - CT head showed abnormal appearance of right basal ganglia region combination of old an acute infarction. - Unable to get MRI due to pacemaker - Neurology was consulted, recommended CTA of the head and neck - CTA head and neck showed acute to subacute small vessel ischemia in the right basal ganglia. Extensive intracranial at resources including moderate to severe stenosis right PCA P2 and distal segments mild to moderate stenosis of the bilateral ICAs symptoms distal right vertebral artery and left PCA. - 2-D echo showed EF of 65-70% with grade 1 diastolic dysfunction. - Severe hyperlipidemia, patient unable to tolerate statins, per family. So far tolerating Zetia, recommend CPK, LFTs in 4-6 weeks.  - Hemoglobin A1c 7.7, placed on sliding scale insulin - Neurology recommended starting Plavix - PT evaluation pending   Active Problems:   Accelerated hypertension - Patient on multiple antihypertensives at home  amlodipine, clonidine, Imdur, lisinopril, metoprolol - BP still not better controlled. 190/78, increased clonidine to 0.2 mg TID. Further adjustments in the BP medications outpatient    Diabetes mellitus (HCC) Hemoglobin A1c 7.7, placed on sliding scale insulin    Hyperlipidemia - Uncontrolled, patient has not been tolerant of statins in the past - Continue gemfibrozil, placed on Zetia, outpatient follow-up closely, obtain CPK, LFTs in 4-6 weeks    Sick sinus syndrome (HCC) - Has pacemaker  Code Status: dnr  DVT Prophylaxis:  Lovenox  Family Communication: Discussed in detail with the patient, all imaging results, lab results explained to the patient and son   Disposition Plan: PT evaluation pending  Time Spent in minutes  25 minutes  Procedures:  CTA head and neck  Consultants:   Neurology  Antimicrobials:      Medications  Scheduled Meds: . amLODipine  5 mg Oral BID  . busPIRone  5 mg Oral BID  . cloNIDine  0.2 mg Oral BID  . clopidogrel  75 mg Oral Daily  . enoxaparin (LOVENOX) injection  40 mg Subcutaneous Q24H  . ezetimibe  10 mg Oral Daily  . fenofibrate  160 mg Oral Daily  . hydrALAZINE  10 mg Intravenous Once  . insulin aspart  0-5 Units Subcutaneous QHS  . insulin aspart  0-9 Units Subcutaneous TID WC  . isosorbide mononitrate  30 mg Oral Daily  . lisinopril  40 mg Oral BID  . Melatonin  3 mg Oral QHS  . metoprolol succinate  50 mg Oral Daily  . niacin  250 mg Oral BH-q7a  . pregabalin  75 mg Oral BID  . ranolazine  500 mg Oral BID   Continuous Infusions: . sodium chloride 50 mL/hr at 09/23/16 2357   PRN Meds:.hydrALAZINE   Antibiotics   Anti-infectives    None        Subjective:   [name] was seen and examined today. Left upper extremity weakness persisting. Left leg weakness better.  Patient denies dizziness, chest pain, shortness of breath, abdominal pain, N/V/D/C, new weakness, numbess, tingling. No acute events overnight.     Objective:   Vitals:   09/25/16 0248 09/25/16 0304 09/25/16 0605 09/25/16 0905  BP: (!) 153/72  (!) 167/50 (!) 190/78  Pulse: 60  66   Resp: 18  20   Temp: 97.4 F (36.3 C)  97.3 F (36.3 C)   TempSrc: Oral  Oral   SpO2: 95% 90% 99%   Weight:      Height:        Intake/Output Summary (Last 24 hours) at 09/25/16 1035 Last data filed at 09/25/16 0606  Gross per 24 hour  Intake              120 ml  Output              650 ml  Net             -530 ml     Wt Readings from Last 3 Encounters:  09/23/16 83 kg (183 lb)     Exam  General: Alert and oriented x 3, NAD  HEENT:   Neck:   Cardiovascular: S1 S2 clear, RRR  Respiratory: CTAB   Gastrointestinal: Soft, nontender, nondistended, + bowel sounds  Ext: no cyanosis clubbing or edema  Neuro:Speech fluent, Right 5/5 upper and lower extremity. Left upper and lower ext 4/5   Skin: No rashes  Psych: Normal affect and demeanor, alert and oriented x3    Data Reviewed:  I have personally reviewed following labs and imaging studies  Micro Results Recent Results (from the past 240 hour(s))  Urine culture     Status: None   Collection Time: 09/23/16  1:14 PM  Result Value Ref Range Status   Specimen Description URINE, CLEAN CATCH  Final   Special Requests NONE  Final   Culture NO GROWTH Performed at [facility]   Final   Report Status 09/24/2016 FINAL  Final    Radiology Reports Ct Angio Head W Or Wo Contrast  Result Date: 09/24/2016 CLINICAL DATA:  82 year old female with left side weakness for 3-4 days and difficulty walking. Evidence of small vessel disease on noncontrast head CT yesterday. Initial encounter. EXAM: CT ANGIOGRAPHY HEAD AND NECK TECHNIQUE: Multidetector CT imaging of the head and neck was performed using the standard protocol during bolus administration of intravenous contrast. Multiplanar CT image reconstructions and MIPs were obtained to evaluate the vascular anatomy. Carotid  stenosis measurements (when applicable) are obtained utilizing NASCET criteria, using the distal internal carotid diameter as the denominator. CONTRAST:  50 mL Isovue 370 COMPARISON:  [facility] noncontrast head CT 09/23/2016 FINDINGS: CTA NECK Skeleton: Multilevel cervical spine  degeneration. Osteopenia. Severe chronic degenerative osseous changes about both shoulders. No acute osseous abnormality identified. Visualized paranasal sinuses and mastoids are stable and well pneumatized. Upper chest: Negative lung apices ; mild dependent atelectasis and mosaic attenuation. No superior mediastinal lymphadenopathy. Left chest cardiac pacemaker type device partially visible. Other neck: Negative thyroid with sub cm right thyroid lobe nodules which do not meet consensus criteria for ultrasound follow-up. Larynx and pharynx soft tissue contours are within normal limits. Negative parapharyngeal and retropharyngeal spaces. Negative sublingual space, submandibular glands, and parotid glands. No cervical lymphadenopathy. Aortic arch: 4 vessel arch configuration, the left vertebral artery arises directly from the arch. Moderate calcified arch atherosclerosis. Right carotid system: No brachiocephalic artery stenosis despite calcified plaque. Tortuous right CCA origin with highly kinked appearance as seen on series 8, image 129. Otherwise no proximal right carotid stenosis. Calcified plaque at the right carotid bifurcation and right ICA bulb with less than 50 % stenosis with respect to the distal vessel. Mildly tortuous cervical right ICA. Left carotid system: Normal left CCA origin. Tortuous proximal left CCA with mild calcified plaque but no stenosis. Intermittent soft and calcified plaque in the distal left CCA and at the left carotid bifurcation with no stenosis. Tortuous cervical left ICA at the C1 level. Minimal distal cervical left ICA calcified plaque. Vertebral arteries: Tortuous proximal right subclavian artery  with calcified plaque but no significant stenosis. Calcified plaque near the right vertebral artery origin without significant stenosis (series 8, image 142). Right V1 segment calcified plaque without stenosis. Tortuous right V2 segment with no vertebral artery stenosis to the skullbase. The left vertebral artery arises directly from the arch with no proximal stenosis. The proximal left vertebral is tortuous. The left vertebral is mildly non dominant. The left V2 segment is tortuous. No stenosis to the skullbase. CTA HEAD Posterior circulation: Calcified plaque in the right V4 segment with mild to moderate stenosis proximal to the right PICA origin which remains patent. The more distal right vertebral artery is normal to the vertebrobasilar junction. No distal left vertebral artery stenosis. The left PICA origin is patent. The basilar artery is patent with mild irregularity and no stenosis. Patent AICA and SCA origins. Fetal type bilateral PCA origins. Mild irregularity and stenosis in the left PCA P2 segment (series 12, image 25). Moderate to severe irregularity and stenosis in the left P3 superior division (same image). Multifocal severe irregularity and stenosis in the distal right P2 segment (series 12, image 18) and proximal right P3 branches (same image). Anterior circulation: Both ICA siphons are patent. Moderate siphon calcified plaque greater on the right. Mild left supraclinoid and mild to moderate right cavernous segment ICA stenoses. Normal ophthalmic and posterior communicating artery origins. Patent carotid termini. Normal MCA and left ACA origins. Mild irregularity and stenosis at the right ACA origin. Anterior communicating artery is within normal limits. Widespread high-grade stenosis throughout the right ACA A2 segment. Moderate to severe distal left A2 segment stenosis. See series 12, image 22). Preserved distal ACA enhancement bilaterally. Mildly irregular left MCA M1 segment. Moderate to severe  irregularity and stenosis at the left MCA bifurcation affecting the anterior division more so, but also with high-grade proximal posterior M2 stenosis but preserved distal flow. See series 12, image 31. Right MCA M1 segment appears normal. At the right MCA bifurcation there is moderate irregularity and stenosis. There is up to moderate scattered irregularity and stenosis in the right MCA M2 and distal branches. Venous sinuses: Patent. Anatomic variants: Left vertebral artery  arises directly from the arch. Bilateral fetal type PCA origins. Delayed phase: Curvilinear enhancement about the heterogeneity in the right caudate nucleus. No associated mass effect. No acute intracranial hemorrhage identified. No other abnormal intracranial enhancement. Review of the MIP images confirms the above findings IMPRESSION: 1. Negative for emergent large vessel occlusion. 2. Mild enhancement without mass effect about the right basal ganglia heterogeneity seen yesterday, suggesting acute to subacute small vessel ischemia there. Otherwise stable CT appearance of the brain. 3. Extensive intracranial atherosclerosis including moderate or severe stenoses: 4. - Right PCA P2 and distal segments. - Right greater than Left ACA A2 segments. - Left greater than Right MCA bifurcations, M2 and distal MCA branches. 5. Mild-to-moderate stenoses of the bilateral ICA siphons, distal right vertebral artery, and left PCA. 6. Mild for age extracranial atherosclerosis, with no hemodynamically significant stenosis in the neck aside from tortuous proximal right CCA with a kinked appearance. Electronically Signed   By: Odessa FlemingH  Hall M.D.   On: 09/24/2016 12:32   Ct Head Wo Contrast  Result Date: 09/23/2016 CLINICAL DATA:  Mental status deterioration over the last week. EXAM: CT HEAD WITHOUT CONTRAST TECHNIQUE: Contiguous axial images were obtained from the base of the skull through the vertex without intravenous contrast. COMPARISON:  None. FINDINGS: Brain:  The brain shows generalized atrophy. There are moderate chronic appearing small vessel ischemic changes affecting the cerebral hemispheric white matter. There is an abnormal pattern in the right basal gangliar region which could relate to old and/or recent infarction. However, some characteristics suggest the possibility of vasogenic edema. Therefore, MRI would be suggested if the patient is a candidate. No evidence of hydrocephalus or extra-axial collection. Vascular: There is atherosclerotic calcification of the major vessels at the base of the brain. Skull: Negative Sinuses/Orbits: Clear/normal Other: None significant IMPRESSION: Atrophy an extensive chronic small vessel ischemic changes. Abnormal appearance of the right basal gangliar region that could represent a combination of old and acute infarction. However, the pattern is somewhat unusual, raising the possibility of vasogenic edema. MRI would be suggested if the patient is a candidate. Electronically Signed   By: Paulina FusiMark  Shogry M.D.   On: 09/23/2016 12:51   Ct Angio Neck W Or Wo Contrast  Result Date: 09/24/2016 CLINICAL DATA:  82 year old female with left side weakness for 3-4 days and difficulty walking. Evidence of small vessel disease on noncontrast head CT yesterday. Initial encounter. EXAM: CT ANGIOGRAPHY HEAD AND NECK TECHNIQUE: Multidetector CT imaging of the head and neck was performed using the standard protocol during bolus administration of intravenous contrast. Multiplanar CT image reconstructions and MIPs were obtained to evaluate the vascular anatomy. Carotid stenosis measurements (when applicable) are obtained utilizing NASCET criteria, using the distal internal carotid diameter as the denominator. CONTRAST:  50 mL Isovue 370 [facility] 09[facility]23/2016 FINDINGS: CTA NECK Skeleton: Multilevel cervical spine degeneration. Osteopenia. Severe chronic degenerative osseous changes about both shoulders. No  acute osseous abnormality identified. Visualized paranasal sinuses and mastoids are stable and well pneumatized. Upper chest: Negative lung apices ; mild dependent atelectasis and mosaic attenuation. No superior mediastinal lymphadenopathy. Left chest cardiac pacemaker type device partially visible. Other neck: Negative thyroid with sub cm right thyroid lobe nodules which do not meet consensus criteria for ultrasound follow-up. Larynx and pharynx soft tissue contours are within normal limits. Negative parapharyngeal and retropharyngeal spaces. Negative sublingual space, submandibular glands, and parotid glands. No cervical lymphadenopathy. Aortic arch: 4 vessel arch configuration, the left vertebral artery arises  directly from the arch. Moderate calcified arch atherosclerosis. Right carotid system: No brachiocephalic artery stenosis despite calcified plaque. Tortuous right CCA origin with highly kinked appearance as seen on series 8, image 129. Otherwise no proximal right carotid stenosis. Calcified plaque at the right carotid bifurcation and right ICA bulb with less than 50 % stenosis with respect to the distal vessel. Mildly tortuous cervical right ICA. Left carotid system: Normal left CCA origin. Tortuous proximal left CCA with mild calcified plaque but no stenosis. Intermittent soft and calcified plaque in the distal left CCA and at the left carotid bifurcation with no stenosis. Tortuous cervical left ICA at the C1 level. Minimal distal cervical left ICA calcified plaque. Vertebral arteries: Tortuous proximal right subclavian artery with calcified plaque but no significant stenosis. Calcified plaque near the right vertebral artery origin without significant stenosis (series 8, image 142). Right V1 segment calcified plaque without stenosis. Tortuous right V2 segment with no vertebral artery stenosis to the skullbase. The left vertebral artery arises directly from the arch with no proximal stenosis. The proximal  left vertebral is tortuous. The left vertebral is mildly non dominant. The left V2 segment is tortuous. No stenosis to the skullbase. CTA HEAD Posterior circulation: Calcified plaque in the right V4 segment with mild to moderate stenosis proximal to the right PICA origin which remains patent. The more distal right vertebral artery is normal to the vertebrobasilar junction. No distal left vertebral artery stenosis. The left PICA origin is patent. The basilar artery is patent with mild irregularity and no stenosis. Patent AICA and SCA origins. Fetal type bilateral PCA origins. Mild irregularity and stenosis in the left PCA P2 segment (series 12, image 25). Moderate to severe irregularity and stenosis in the left P3 superior division (same image). Multifocal severe irregularity and stenosis in the distal right P2 segment (series 12, image 18) and proximal right P3 branches (same image). Anterior circulation: Both ICA siphons are patent. Moderate siphon calcified plaque greater on the right. Mild left supraclinoid and mild to moderate right cavernous segment ICA stenoses. Normal ophthalmic and posterior communicating artery origins. Patent carotid termini. Normal MCA and left ACA origins. Mild irregularity and stenosis at the right ACA origin. Anterior communicating artery is within normal limits. Widespread high-grade stenosis throughout the right ACA A2 segment. Moderate to severe distal left A2 segment stenosis. See series 12, image 22). Preserved distal ACA enhancement bilaterally. Mildly irregular left MCA M1 segment. Moderate to severe irregularity and stenosis at the left MCA bifurcation affecting the anterior division more so, but also with high-grade proximal posterior M2 stenosis but preserved distal flow. See series 12, image 31. Right MCA M1 segment appears normal. At the right MCA bifurcation there is moderate irregularity and stenosis. There is up to moderate scattered irregularity and stenosis in the  right MCA M2 and distal branches. Venous sinuses: Patent. Anatomic variants: Left vertebral artery arises directly from the arch. Bilateral fetal type PCA origins. Delayed phase: Curvilinear enhancement about the heterogeneity in the right caudate nucleus. No associated mass effect. No acute intracranial hemorrhage identified. No other abnormal intracranial enhancement. Review of the MIP images confirms the above findings IMPRESSION: 1. Negative for emergent large vessel occlusion. 2. Mild enhancement without mass effect about the right basal ganglia heterogeneity seen yesterday, suggesting acute to subacute small vessel ischemia there. Otherwise stable CT appearance of the brain. 3. Extensive intracranial atherosclerosis including moderate or severe stenoses: 4. - Right PCA P2 and distal segments. - Right greater than Left ACA A2  segments. - Left greater than Right MCA bifurcations, M2 and distal MCA branches. 5. Mild-to-moderate stenoses of the bilateral ICA siphons, distal right vertebral artery, and left PCA. 6. Mild for age extracranial atherosclerosis, with no hemodynamically significant stenosis in the neck aside from tortuous proximal right CCA with a kinked appearance. Electronically Signed   By: Odessa FlemingH  Hall M.D.   On: 09/24/2016 12:32   Dg Chest Port 1 View  Result Date: 09/23/2016 CLINICAL DATA:  Slurred speech and confusion. EXAM: PORTABLE CHEST 1 VIEW COMPARISON:  None. FINDINGS: No pneumothorax. Cardiomegaly. The hila and mediastinum are unremarkable. No pulmonary nodules or masses. Mild left basilar atelectasis. No suspicious infiltrate. Chronic changes in the shoulders, left worse than right. IMPRESSION: No active disease. Electronically Signed   By: Gerome Samavid  Williams III M.D   On: 09/23/2016 12:55    Lab Data:  CBC:  Recent Labs Lab 09/23/16 1136 09/23/16 1650  WBC 9.6 7.2  HGB 13.8 14.2  HCT 40.5 43.1  MCV 86.4 87.6  PLT 248 242   Basic Metabolic Panel:  Recent Labs Lab  09/23/16 1136 09/23/16 1650 09/24/16 0749  NA 138  --  142  K 4.5  --  3.9  CL 100*  --  103  CO2 29  --  26  GLUCOSE 262*  --  138*  BUN 22*  --  16  CREATININE 1.21* 1.12* 1.07*  CALCIUM 9.1  --  9.1   GFR: Estimated Creatinine Clearance: 33.5 mL/min (by C-G formula based on SCr of 1.07 mg/dL (H)). Liver Function Tests:  Recent Labs Lab 09/23/16 1136  AST 35  ALT 28  ALKPHOS 41  BILITOT 0.7  PROT 6.5  ALBUMIN 4.0   No results for input(s): LIPASE, AMYLASE in the last 168 hours. No results for input(s): AMMONIA in the last 168 hours. Coagulation Profile: No results for input(s): INR, PROTIME in the last 168 hours. Cardiac Enzymes:  Recent Labs Lab 09/23/16 1136  TROPONINI <0.03   BNP (last 3 results) No results for input(s): PROBNP in the last 8760 hours. HbA1C:  Recent Labs  09/24/16 0528  HGBA1C 7.7*   CBG:  Recent Labs Lab 09/23/16 1145 09/23/16 2131  GLUCAP 257* 173*   Lipid Profile:  Recent Labs  09/24/16 0528  CHOL 274*  HDL 32*  LDLCALC UNABLE TO CALCULATE IF TRIGLYCERIDE OVER 400 mg/dL  TRIG [id number]*  CHOLHDL 8.6   Thyroid Function Tests: No results for input(s): TSH, T4TOTAL, FREET4, T3FREE, THYROIDAB in the last 72 hours. Anemia Panel: No results for input(s): VITAMINB12, FOLATE, FERRITIN, TIBC, IRON, RETICCTPCT in the last 72 hours. Urine analysis:    Component Value Date/Time   COLORURINE YELLOW 09/23/2016 1314   APPEARANCEUR CLEAR 09/23/2016 1314   LABSPEC 1.014 09/23/2016 1314   PHURINE 6.5 09/23/2016 1314   GLUCOSEU 250 (A) 09/23/2016 1314   HGBUR NEGATIVE 09/23/2016 1314   BILIRUBINUR NEGATIVE 09/23/2016 1314   KETONESUR NEGATIVE 09/23/2016 1314   PROTEINUR NEGATIVE 09/23/2016 1314   NITRITE NEGATIVE 09/23/2016 1314   LEUKOCYTESUR NEGATIVE 09/23/2016 1314     Tityana Pagan M.D. [facility] 09/25/2016, 10:35 AM  Pager: [phone] Between 7am to 7pm - call Pager - [phone]  After 7pm go to [email or web address] -  password [email or web address]  Call night coverage person covering after 7pm

## 2016-09-25 NOTE — Evaluation (Signed)
Physical Therapy Evaluation Patient Details Name: Charlene Greene MRN: [id number] DOB: Feb 11, 1922 Today's Date: 09/25/2016   History of Present Illness  Pt is a 82 y/o female who currently resides in an independent living facility. Pt presents with a history of hypertension, hyperlipidemia, sick sinus syndrome status post pacemaker, prior CAD, diabetes mellitus who presented to ED with left-sided weakness for past 3-4 days and slurred speech, facial drooping. CT head showed abnormal appearance of right basal ganglia region combination of old an acute infarction.  Clinical Impression  Pt admitted with above diagnosis. Pt currently with functional limitations due to the deficits listed below (see PT Problem List). At the time of PT eval pt was able to perform transfers with +2 assist for balance assist and safety. Pt very independent PTA and feel pt would benefit from continued rehab at the CIR setting. Pt will benefit from skilled PT to increase their independence and safety with mobility to allow discharge to the venue listed below.       Follow Up Recommendations CIR;Supervision/Assistance - 24 hour    Equipment Recommendations  None recommended by PT    Recommendations for Other Services Rehab consult     Precautions / Restrictions Precautions Precautions: Fall Restrictions Weight Bearing Restrictions: No      Mobility  Bed Mobility Overal bed mobility: Needs Assistance Bed Mobility: Rolling;Sidelying to Sit Rolling: Min assist Sidelying to sit: Mod assist       General bed mobility comments: VC's for sequencing and technique. Pt was able to reach for the railing on the L side (with RUE) and required assist to elevate trunk to full sitting position.   Transfers Overall transfer level: Needs assistance Equipment used: Rolling walker (2 wheeled) Transfers: Sit to/from [employer] CorporationStand;Stand Pivot Transfers Sit to Stand: Min assist;+2 physical assistance Stand pivot transfers: Min  assist;+2 safety/equipment       General transfer comment: VC's for hand placement on seated surface for safety. Pt was able to power-up to full standing position with +2 assist and cues to reach for the walker. Assist provided for walker placement as pt took pivotal steps around to the recliner chair.   Ambulation/Gait                Stairs            Wheelchair Mobility    Modified Rankin (Stroke Patients Only) Modified Rankin (Stroke Patients Only) Pre-Morbid Rankin Score: No significant disability Modified Rankin: Moderately severe disability     Balance Overall balance assessment: Needs assistance Sitting-balance support: Feet supported;No upper extremity supported Sitting balance-Leahy Scale: Fair   Postural control: Left lateral lean Standing balance support: Bilateral upper extremity supported;During functional activity Standing balance-Leahy Scale: Poor                               Pertinent Vitals/Pain Pain Assessment: 0-10 Pain Score: 0-No pain Faces Pain Scale: No hurt    Home Living Family/patient expects to be discharged to:: Private residence Living Arrangements: Alone Available Help at Discharge: Family;Available 24 hours/day Type of Home: Apartment Home Access: Elevator     Home Layout: One level Home Equipment: Grab bars - toilet;Grab bars - tub/shower;Shower seat - built in;Walker - 4 wheels;Walker - 2 wheels      Prior Function Level of Independence: Independent with assistive device(s)         Comments: Used a rollator all the time     Hand Dominance  Dominant Hand: Right    Extremity/Trunk Assessment   Upper Extremity Assessment: Defer to OT evaluation;LUE deficits/detail       LUE Deficits / Details: Decreased strength and AROM - see OT note for details   Lower Extremity Assessment: LLE deficits/detail   LLE Deficits / Details: Decreased strength and AROM grossly. Hip flexors 3/5, quads 4-/5,  hamstrings 4-/5  Cervical / Trunk Assessment: Kyphotic;Other exceptions  Communication   Communication: No difficulties  Cognition Arousal/Alertness: Awake/alert Behavior During Therapy: Flat affect Overall Cognitive Status: Within Functional Limits for tasks assessed                      General Comments      Exercises     Assessment/Plan    PT Assessment Patient needs continued PT services  PT Problem List Decreased strength;Decreased range of motion;Decreased activity tolerance;Decreased balance;Decreased mobility;Decreased safety awareness;Decreased knowledge of use of DME;Decreased knowledge of precautions;Pain          PT Treatment Interventions DME instruction;Gait training;Stair training;Functional mobility training;Therapeutic activities;Therapeutic exercise;Neuromuscular re-education;Patient/family education    PT Goals (Current goals can be found in the Care Plan section)  Acute Rehab PT Goals Patient Stated Goal: Return to her independent living apartment PT Goal Formulation: With patient/family Time For Goal Achievement: 10/09/16 Potential to Achieve Goals: Good    Frequency Min 4X/week   Barriers to discharge        Co-evaluation               End of Session Equipment Utilized During Treatment: Gait belt Activity Tolerance: Patient tolerated treatment well Patient left: in chair;with call bell/phone within reach;with family/visitor present Nurse Communication: Mobility status         Time: [id number] PT Time Calculation (min) (ACUTE ONLY): 22 min   Charges:   PT Evaluation $PT Eval Moderate Complexity: 1 Procedure     PT G CodesMarylynn Pearson:        Kainat Pizana D Dlynn Ranes 09/25/2016, 2:43 PM   Conni SlipperLaura Danene Montijo, PT, DPT Acute Rehabilitation Services Pager: [phone]

## 2016-09-25 NOTE — Progress Notes (Signed)
Rehab Admissions Coordinator Note:  Patient was screened by Trish MageLogue, Ethelean Colla M for appropriateness for an Inpatient Acute Rehab Consult.  At this time, we are recommending Inpatient Rehab consult.  Trish MageLogue, Nataniel Gasper M 09/25/2016, 3:28 PM  I can be reached at [phone].

## 2016-09-25 NOTE — Evaluation (Signed)
Speech Language Pathology Evaluation Patient Details Name: Susa Griffinsell Minehart MRN: [id number] DOB: [birth year] Today's Date: 09/25/2016 Time: [id number] SLP Time Calculation (min) (ACUTE ONLY): 28 min  Problem List:  Patient Active Problem List   Diagnosis Date Noted  . Accelerated hypertension 09/24/2016  . Diabetes mellitus (HCC) 09/24/2016  . Hyperlipidemia 09/24/2016  . Sick sinus syndrome (HCC) 09/24/2016  . CVA (cerebral vascular accident) (HCC) 09/23/2016  . Stroke (cerebrum) (HCC) 09/23/2016   Past Medical History:  Past Medical History:  Diagnosis Date  . Diabetes mellitus without complication (HCC)   . Hypertension   . Pacemaker    Past Surgical History:  Past Surgical History:  Procedure Laterality Date  . PACEMAKER PLACEMENT     HPI:  Patient is a 82 year old female with history of hypertension, hyperlipidemia, sick sinus syndrome status post pacemaker, prior CAD, diabetes mellitus who presented to ED with left-sided weakness for past 3-4 days and slurred speech, facial drooping. CT head showed abnormal appearance of right basal ganglia region combination of old an acute infarction.    Assessment / Plan / Recommendation Clinical Impression  Pt presents at baseline level for cognitive and speech-language functioning. Son present reports initial deficits have largely resolved since admission. Pt participated in MoCA and scored 26 (26 or higher is considered typical). SLP provided education for safety and precautions (i.e. finances and medication management) for pt returning to independent living upon D/C. Recommend no additional ST. Will s/o.    SLP Assessment  Patient does not need any further Speech Lanaguage Pathology Services    Follow Up Recommendations  Other (comment) (close supervision upon D/C)    Frequency and Duration           SLP Evaluation Cognition  Overall Cognitive Status: Within Functional Limits for tasks assessed       Comprehension  Auditory  Comprehension Overall Auditory Comprehension: Appears within functional limits for tasks assessed Visual Recognition/Discrimination Discrimination: Not tested Reading Comprehension Reading Status: Not tested    Expression Expression Primary Mode of Expression: Verbal Verbal Expression Overall Verbal Expression: Appears within functional limits for tasks assessed Written Expression Dominant Hand: Right Written Expression: Not tested   Oral / Motor  Oral Motor/Sensory Function Overall Oral Motor/Sensory Function: Within functional limits Motor Speech Overall Motor Speech: Appears within functional limits for tasks assessed   GO                   Tollie EthHaleigh Ragan Tyre Beaver, Student SLP  Caryl NeverHaleigh R Damico Partin 09/25/2016, 2:33 PM

## 2016-09-25 NOTE — Progress Notes (Signed)
OT Cancellation Note  Patient Details Name: Charlene Greene MRN: [id number] DOB: 1922-10-15   Cancelled Treatment:    Reason Eval/Treat Not Completed: Patient at procedure or test/ unavailable ( Pt evaluation currently in place- Ot will follow up tomorrow in the AM)  Boone MasterJones, Suhan Paci B 09/25/2016, 4:12 PM

## 2016-09-25 NOTE — Progress Notes (Signed)
STROKE TEAM PROGRESS NOTE   HISTORY OF PRESENT ILLNESS (per record) Charlene Greene is an 82 y.o. female who presented to the ED with a chief complaint of left sided weakness for 3-4 days in conjunction with difficulty ambulating. Also with some difficulty naming things. At baseline she is high functioning with no memory deficits. [facility] right basal ganglia hypodensity suggestive of subacute ischemic infarction.   Her PMHx includes HTN, HLD, CAD, pacemaker, DM2 and depression  Patient was not administered IV t-PA. She was admitted for further evaluation and treatment.   SUBJECTIVE (INTERVAL HISTORY) No events overnight. Awake. No agitation.    OBJECTIVE Temp:  [97.3 F (36.3 C)-98.8 F (37.1 C)] 98.8 F (37.1 C) (11/14 1455) Pulse Rate:  [60-76] 71 (11/14 1455) Cardiac Rhythm: Atrial paced (11/14 0700) Resp:  [18-20] 18 (11/14 1455) BP: (145-197)/(50-92) 145/62 (11/14 1455) SpO2:  [90 %-99 %] 93 % (11/14 1455)  CBC:   Recent Labs Lab 09/23/16 1136 09/23/16 1650  WBC 9.6 7.2  HGB 13.8 14.2  HCT 40.5 43.1  MCV 86.4 87.6  PLT 248 242    Basic Metabolic Panel:   Recent Labs Lab 09/23/16 1136 09/23/16 1650 09/24/16 0749  NA 138  --  142  K 4.5  --  3.9  CL 100*  --  103  CO2 29  --  26  GLUCOSE 262*  --  138*  BUN 22*  --  16  CREATININE 1.21* 1.12* 1.07*  CALCIUM 9.1  --  9.1    Lipid Panel:     Component Value Date/Time   CHOL 274 (H) 09/24/2016 0528   TRIG 530 (H) 09/24/2016 0528   HDL 32 (L) 09/24/2016 0528   CHOLHDL 8.6 09/24/2016 0528   VLDL UNABLE TO CALCULATE IF TRIGLYCERIDE OVER 400 mg/dL 16/10/960411/13/2017 [id number]   LDLCALC UNABLE TO CALCULATE IF TRIGLYCERIDE OVER 400 mg/dL 81/19/147811/13/2017 [id number]   OZHY8MHgbA1c:  Lab Results  Component Value Date   HGBA1C 7.7 (H) 09/24/2016   Urine Drug Screen: No results found for: LABOPIA, COCAINSCRNUR, LABBENZ, AMPHETMU, THCU, LABBARB    IMAGING  Ct Angio Head W Or Wo Contrast  Result Date:  09/24/2016 CLINICAL DATA:  82 year old female with left side weakness for 3-4 days and difficulty walking. Evidence of small vessel disease on noncontrast head CT yesterday. Initial encounter. EXAM: CT ANGIOGRAPHY HEAD AND NECK TECHNIQUE: Multidetector CT imaging of the head and neck was performed using the standard protocol during bolus administration of intravenous contrast. Multiplanar CT image reconstructions and MIPs were obtained to evaluate the vascular anatomy. Carotid stenosis measurements (when applicable) are obtained utilizing NASCET criteria, using the distal internal carotid diameter as the denominator. CONTRAST:  50 mL Isovue 370 [facility] 09/23/2016 FINDINGS: CTA NECK Skeleton: Multilevel cervical spine degeneration. Osteopenia. Severe chronic degenerative osseous changes about both shoulders. No acute osseous abnormality identified. Visualized paranasal sinuses and mastoids are stable and well pneumatized. Upper chest: Negative lung apices ; mild dependent atelectasis and mosaic attenuation. No superior mediastinal lymphadenopathy. Left chest cardiac pacemaker type device partially visible. Other neck: Negative thyroid with sub cm right thyroid lobe nodules which do not meet consensus criteria for ultrasound follow-up. Larynx and pharynx soft tissue contours are within normal limits. Negative parapharyngeal and retropharyngeal spaces. Negative sublingual space, submandibular glands, and parotid glands. No cervical lymphadenopathy. Aortic arch: 4 vessel arch configuration, the left vertebral artery arises directly from the arch. Moderate calcified arch atherosclerosis. Right carotid  system: No brachiocephalic artery stenosis despite calcified plaque. Tortuous right CCA origin with highly kinked appearance as seen on series 8, image 129. Otherwise no proximal right carotid stenosis. Calcified plaque at the right carotid bifurcation and right ICA bulb with  less than 50 % stenosis with respect to the distal vessel. Mildly tortuous cervical right ICA. Left carotid system: Normal left CCA origin. Tortuous proximal left CCA with mild calcified plaque but no stenosis. Intermittent soft and calcified plaque in the distal left CCA and at the left carotid bifurcation with no stenosis. Tortuous cervical left ICA at the C1 level. Minimal distal cervical left ICA calcified plaque. Vertebral arteries: Tortuous proximal right subclavian artery with calcified plaque but no significant stenosis. Calcified plaque near the right vertebral artery origin without significant stenosis (series 8, image 142). Right V1 segment calcified plaque without stenosis. Tortuous right V2 segment with no vertebral artery stenosis to the skullbase. The left vertebral artery arises directly from the arch with no proximal stenosis. The proximal left vertebral is tortuous. The left vertebral is mildly non dominant. The left V2 segment is tortuous. No stenosis to the skullbase. CTA HEAD Posterior circulation: Calcified plaque in the right V4 segment with mild to moderate stenosis proximal to the right PICA origin which remains patent. The more distal right vertebral artery is normal to the vertebrobasilar junction. No distal left vertebral artery stenosis. The left PICA origin is patent. The basilar artery is patent with mild irregularity and no stenosis. Patent AICA and SCA origins. Fetal type bilateral PCA origins. Mild irregularity and stenosis in the left PCA P2 segment (series 12, image 25). Moderate to severe irregularity and stenosis in the left P3 superior division (same image). Multifocal severe irregularity and stenosis in the distal right P2 segment (series 12, image 18) and proximal right P3 branches (same image). Anterior circulation: Both ICA siphons are patent. Moderate siphon calcified plaque greater on the right. Mild left supraclinoid and mild to moderate right cavernous segment ICA  stenoses. Normal ophthalmic and posterior communicating artery origins. Patent carotid termini. Normal MCA and left ACA origins. Mild irregularity and stenosis at the right ACA origin. Anterior communicating artery is within normal limits. Widespread high-grade stenosis throughout the right ACA A2 segment. Moderate to severe distal left A2 segment stenosis. See series 12, image 22). Preserved distal ACA enhancement bilaterally. Mildly irregular left MCA M1 segment. Moderate to severe irregularity and stenosis at the left MCA bifurcation affecting the anterior division more so, but also with high-grade proximal posterior M2 stenosis but preserved distal flow. See series 12, image 31. Right MCA M1 segment appears normal. At the right MCA bifurcation there is moderate irregularity and stenosis. There is up to moderate scattered irregularity and stenosis in the right MCA M2 and distal branches. Venous sinuses: Patent. Anatomic variants: Left vertebral artery arises directly from the arch. Bilateral fetal type PCA origins. Delayed phase: Curvilinear enhancement about the heterogeneity in the right caudate nucleus. No associated mass effect. No acute intracranial hemorrhage identified. No other abnormal intracranial enhancement. Review of the MIP images confirms the above findings IMPRESSION: 1. Negative for emergent large vessel occlusion. 2. Mild enhancement without mass effect about the right basal ganglia heterogeneity seen yesterday, suggesting acute to subacute small vessel ischemia there. Otherwise stable CT appearance of the brain. 3. Extensive intracranial atherosclerosis including moderate or severe stenoses: 4. - Right PCA P2 and distal segments. - Right greater than Left ACA A2 segments. - Left greater than Right MCA bifurcations, M2 and  distal MCA branches. 5. Mild-to-moderate stenoses of the bilateral ICA siphons, distal right vertebral artery, and left PCA. 6. Mild for age extracranial atherosclerosis, with  no hemodynamically significant stenosis in the neck aside from tortuous proximal right CCA with a kinked appearance. Electronically Signed   By: Odessa Fleming M.D.   On: 09/24/2016 12:32   Ct Angio Neck W Or Wo Contrast  Result Date: 09/24/2016 CLINICAL DATA:  82 year old female with left side weakness for 3-4 days and difficulty walking. Evidence of small vessel disease on noncontrast head CT yesterday. Initial encounter. EXAM: CT ANGIOGRAPHY HEAD AND NECK TECHNIQUE: Multidetector CT imaging of the head and neck was performed using the standard protocol during bolus administration of intravenous contrast. Multiplanar CT image reconstructions and MIPs were obtained to evaluate the vascular anatomy. Carotid stenosis measurements (when applicable) are obtained utilizing NASCET criteria, using the distal internal carotid diameter as the denominator. CONTRAST:  50 mL Isovue 370 COMPARISON:  [facility] noncontrast head CT 09/23/2016 FINDINGS: CTA NECK Skeleton: Multilevel cervical spine degeneration. Osteopenia. Severe chronic degenerative osseous changes about both shoulders. No acute osseous abnormality identified. Visualized paranasal sinuses and mastoids are stable and well pneumatized. Upper chest: Negative lung apices ; mild dependent atelectasis and mosaic attenuation. No superior mediastinal lymphadenopathy. Left chest cardiac pacemaker type device partially visible. Other neck: Negative thyroid with sub cm right thyroid lobe nodules which do not meet consensus criteria for ultrasound follow-up. Larynx and pharynx soft tissue contours are within normal limits. Negative parapharyngeal and retropharyngeal spaces. Negative sublingual space, submandibular glands, and parotid glands. No cervical lymphadenopathy. Aortic arch: 4 vessel arch configuration, the left vertebral artery arises directly from the arch. Moderate calcified arch atherosclerosis. Right carotid system: No brachiocephalic artery stenosis  despite calcified plaque. Tortuous right CCA origin with highly kinked appearance as seen on series 8, image 129. Otherwise no proximal right carotid stenosis. Calcified plaque at the right carotid bifurcation and right ICA bulb with less than 50 % stenosis with respect to the distal vessel. Mildly tortuous cervical right ICA. Left carotid system: Normal left CCA origin. Tortuous proximal left CCA with mild calcified plaque but no stenosis. Intermittent soft and calcified plaque in the distal left CCA and at the left carotid bifurcation with no stenosis. Tortuous cervical left ICA at the C1 level. Minimal distal cervical left ICA calcified plaque. Vertebral arteries: Tortuous proximal right subclavian artery with calcified plaque but no significant stenosis. Calcified plaque near the right vertebral artery origin without significant stenosis (series 8, image 142). Right V1 segment calcified plaque without stenosis. Tortuous right V2 segment with no vertebral artery stenosis to the skullbase. The left vertebral artery arises directly from the arch with no proximal stenosis. The proximal left vertebral is tortuous. The left vertebral is mildly non dominant. The left V2 segment is tortuous. No stenosis to the skullbase. CTA HEAD Posterior circulation: Calcified plaque in the right V4 segment with mild to moderate stenosis proximal to the right PICA origin which remains patent. The more distal right vertebral artery is normal to the vertebrobasilar junction. No distal left vertebral artery stenosis. The left PICA origin is patent. The basilar artery is patent with mild irregularity and no stenosis. Patent AICA and SCA origins. Fetal type bilateral PCA origins. Mild irregularity and stenosis in the left PCA P2 segment (series 12, image 25). Moderate to severe irregularity and stenosis in the left P3 superior division (same image). Multifocal severe irregularity and stenosis in the distal right P2 segment (series 12,  image 18) and proximal right P3 branches (same image). Anterior circulation: Both ICA siphons are patent. Moderate siphon calcified plaque greater on the right. Mild left supraclinoid and mild to moderate right cavernous segment ICA stenoses. Normal ophthalmic and posterior communicating artery origins. Patent carotid termini. Normal MCA and left ACA origins. Mild irregularity and stenosis at the right ACA origin. Anterior communicating artery is within normal limits. Widespread high-grade stenosis throughout the right ACA A2 segment. Moderate to severe distal left A2 segment stenosis. See series 12, image 22). Preserved distal ACA enhancement bilaterally. Mildly irregular left MCA M1 segment. Moderate to severe irregularity and stenosis at the left MCA bifurcation affecting the anterior division more so, but also with high-grade proximal posterior M2 stenosis but preserved distal flow. See series 12, image 31. Right MCA M1 segment appears normal. At the right MCA bifurcation there is moderate irregularity and stenosis. There is up to moderate scattered irregularity and stenosis in the right MCA M2 and distal branches. Venous sinuses: Patent. Anatomic variants: Left vertebral artery arises directly from the arch. Bilateral fetal type PCA origins. Delayed phase: Curvilinear enhancement about the heterogeneity in the right caudate nucleus. No associated mass effect. No acute intracranial hemorrhage identified. No other abnormal intracranial enhancement. Review of the MIP images confirms the above findings IMPRESSION: 1. Negative for emergent large vessel occlusion. 2. Mild enhancement without mass effect about the right basal ganglia heterogeneity seen yesterday, suggesting acute to subacute small vessel ischemia there. Otherwise stable CT appearance of the brain. 3. Extensive intracranial atherosclerosis including moderate or severe stenoses: 4. - Right PCA P2 and distal segments. - Right greater than Left ACA A2  segments. - Left greater than Right MCA bifurcations, M2 and distal MCA branches. 5. Mild-to-moderate stenoses of the bilateral ICA siphons, distal right vertebral artery, and left PCA. 6. Mild for age extracranial atherosclerosis, with no hemodynamically significant stenosis in the neck aside from tortuous proximal right CCA with a kinked appearance. Electronically Signed   By: Odessa FlemingH  Hall M.D.   On: 09/24/2016 12:32    PHYSICAL EXAM Pleasant elderly female not in distress. . Afebrile. Head is nontraumatic. Neck is supple without bruit.    Cardiac exam no murmur or gallop. Lungs are clear to auscultation. Distal pulses are well felt. Neurological Exam :  Awake, axox4.  No aphasia or dysarthria. Follows commands well. Pupils irregular sluggishly reactive.  Has left sided weakness 4/5 upper and lower exts. Right sided upper and lower ext is 5/5. Tongue midline.  Tone is symmetric. Sensation is intact. Coordination is slow but accurate. Gait was not tested.  ASSESSMENT/PLAN Charlene Greene is a 82 y.o. female with history of HTN, HLD, CAD, pacemaker, DM2 and depression presenting with L sided weakness for 3- days with difficulty ambulating. She did not receive IV t-PA due to delay in arrival.   Stroke:  Non-dominant right basal ganglia infarct- acute plus chronic components secondary to small vessel disease    MRI  / MRA  pacer  CTA head and neck no large vessel occlusion. Extensive intracranial atherosclerosis.    2D Echo  LVH, normal EF, unremarkable.   LDL unable to calculate  HgbA1c  7.7  Lovenox 40 mg sq daily for VTE prophylaxis Diet Heart Room service appropriate? Yes; Fluid consistency: Thin  aspirin 325 mg daily prior to admission, now on aspirin 325 mg daily. Consider change to plavix   Patient counseled to be compliant with her antithrombotic medications  Ongoing aggressive stroke risk factor management  Therapy recommendations:  pending   Disposition:  pending  ([facility])  Essential Hypertension  BP is better today after restart home medications   Permissive hypertension (OK if < 220/120) but gradually normalize in 5-7 days  Long-term BP goal normotensive  Hyperlipidemia  Home meds: No statin  LDL unable to calculate, goal < 70  Unable to tolerate statins (myalgia)  Consider Zetia or PSK-9 inhibitors.   Diabetes type II  HgbA1c 7.7   Other Stroke Risk Factors  Advanced age  Obesity, Body mass index is 33.47 kg/m., recommend weight loss, diet and exercise as appropriate   Coronary artery disease  Other Active Problems  SSS s/p pacer - have asked cardiology to interrogate  Depression  Mild volume depletion  Hospital day # 2  Ahmed, Elyn Peers M.D. PGY-3 I have personally examined this patient, reviewed notes, independently viewed imaging studies, participated in medical decision making and plan of care.ROS completed by me personally and pertinent positives fully documented  I have made any additions or clarifications directly to the above note. Agree with note above. . Stroke team will sign off. Transfer to rehabilitation when medically stable.  Delia Heady, MD Medical Director [facility] Pager: [phone] 09/25/2016 5:57 PM

## 2016-09-25 NOTE — Progress Notes (Signed)
Inpatient Diabetes Program Recommendations  AACE/ADA: New Consensus Statement on Inpatient Glycemic Control (2015)  Target Ranges:  Prepandial:   less than 140 mg/dL      Peak postprandial:   less than 180 mg/dL (1-2 hours)      Critically ill patients:  140 - 180 mg/dL  Results for [name], Charlene (MRN [id number]) as of 09/25/2016 07:15  Ref. Range 09/23/2016 11:36 09/24/2016 05:28 09/24/2016 07:49  Hemoglobin A1C Latest Ref Range: 4.8 - 5.6 %  7.7 (H)   Glucose Latest Ref Range: 65 - 99 mg/dL 409262 (H)  811138 (H)   Results for [name], Charlene Greene (MRN [id number]) as of 09/25/2016 07:15  Ref. Range 09/23/2016 11:45 09/23/2016 21:31  Glucose-Capillary Latest Ref Range: 65 - 99 mg/dL 213257 (H) 086173 (H)    Review of Glycemic Control  Diabetes history: DM2 Outpatient Diabetes medications: Amaryl 2 mg QAM, Amaryl 1 mg QHS Current orders for Inpatient glycemic control: None  Inpatient Diabetes Program Recommendations Correction (SSI): Please order CBGs with Novolog correction scale ACHS.  Thanks, Orlando PennerMarie Yecheskel Kurek, RN, MSN, CDE Diabetes Coordinator Inpatient Diabetes Program [phone] (Team Pager from 8am to 5pm)

## 2016-09-26 ENCOUNTER — Inpatient Hospital Stay (HOSPITAL_COMMUNITY)
Admission: RE | Admit: 2016-09-26 | Discharge: 2016-10-11 | DRG: 057 | Disposition: A | Discharge status: Home Health | Payer: Medicare Other | Source: Intra-hospital | Attending: Physical Medicine & Rehabilitation | Admitting: Physical Medicine & Rehabilitation

## 2016-09-26 ENCOUNTER — Encounter (HOSPITAL_COMMUNITY): Payer: Self-pay | Admitting: *Deleted

## 2016-09-26 DIAGNOSIS — Z79899 Other long term (current) drug therapy: Secondary | ICD-10-CM

## 2016-09-26 DIAGNOSIS — E785 Hyperlipidemia, unspecified: Secondary | ICD-10-CM

## 2016-09-26 DIAGNOSIS — G4700 Insomnia, unspecified: Secondary | ICD-10-CM

## 2016-09-26 DIAGNOSIS — F329 Major depressive disorder, single episode, unspecified: Secondary | ICD-10-CM | POA: Diagnosis not present

## 2016-09-26 DIAGNOSIS — I959 Hypotension, unspecified: Secondary | ICD-10-CM | POA: Diagnosis not present

## 2016-09-26 DIAGNOSIS — I952 Hypotension due to drugs: Secondary | ICD-10-CM

## 2016-09-26 DIAGNOSIS — N179 Acute kidney failure, unspecified: Secondary | ICD-10-CM

## 2016-09-26 DIAGNOSIS — I169 Hypertensive crisis, unspecified: Secondary | ICD-10-CM

## 2016-09-26 DIAGNOSIS — I495 Sick sinus syndrome: Secondary | ICD-10-CM

## 2016-09-26 DIAGNOSIS — Z950 Presence of cardiac pacemaker: Secondary | ICD-10-CM

## 2016-09-26 DIAGNOSIS — B964 Proteus (mirabilis) (morganii) as the cause of diseases classified elsewhere: Secondary | ICD-10-CM | POA: Diagnosis not present

## 2016-09-26 DIAGNOSIS — Z7982 Long term (current) use of aspirin: Secondary | ICD-10-CM | POA: Diagnosis not present

## 2016-09-26 DIAGNOSIS — I6300 Cerebral infarction due to thrombosis of unspecified precerebral artery: Secondary | ICD-10-CM

## 2016-09-26 DIAGNOSIS — I10 Essential (primary) hypertension: Secondary | ICD-10-CM | POA: Diagnosis present

## 2016-09-26 DIAGNOSIS — N289 Disorder of kidney and ureter, unspecified: Secondary | ICD-10-CM

## 2016-09-26 DIAGNOSIS — N390 Urinary tract infection, site not specified: Secondary | ICD-10-CM | POA: Diagnosis not present

## 2016-09-26 DIAGNOSIS — I69354 Hemiplegia and hemiparesis following cerebral infarction affecting left non-dominant side: Principal | ICD-10-CM

## 2016-09-26 DIAGNOSIS — N183 Chronic kidney disease, stage 3 unspecified: Secondary | ICD-10-CM

## 2016-09-26 DIAGNOSIS — Z7984 Long term (current) use of oral hypoglycemic drugs: Secondary | ICD-10-CM | POA: Diagnosis not present

## 2016-09-26 DIAGNOSIS — I69390 Apraxia following cerebral infarction: Secondary | ICD-10-CM | POA: Diagnosis not present

## 2016-09-26 DIAGNOSIS — F419 Anxiety disorder, unspecified: Secondary | ICD-10-CM | POA: Diagnosis not present

## 2016-09-26 DIAGNOSIS — I6320 Cerebral infarction due to unspecified occlusion or stenosis of unspecified precerebral arteries: Secondary | ICD-10-CM

## 2016-09-26 DIAGNOSIS — K5900 Constipation, unspecified: Secondary | ICD-10-CM

## 2016-09-26 DIAGNOSIS — R3915 Urgency of urination: Secondary | ICD-10-CM | POA: Diagnosis not present

## 2016-09-26 DIAGNOSIS — I150 Renovascular hypertension: Secondary | ICD-10-CM

## 2016-09-26 DIAGNOSIS — E1142 Type 2 diabetes mellitus with diabetic polyneuropathy: Secondary | ICD-10-CM

## 2016-09-26 DIAGNOSIS — E782 Mixed hyperlipidemia: Secondary | ICD-10-CM

## 2016-09-26 DIAGNOSIS — R7309 Other abnormal glucose: Secondary | ICD-10-CM

## 2016-09-26 DIAGNOSIS — R748 Abnormal levels of other serum enzymes: Secondary | ICD-10-CM

## 2016-09-26 DIAGNOSIS — Z7902 Long term (current) use of antithrombotics/antiplatelets: Secondary | ICD-10-CM

## 2016-09-26 DIAGNOSIS — E1149 Type 2 diabetes mellitus with other diabetic neurological complication: Secondary | ICD-10-CM

## 2016-09-26 DIAGNOSIS — I639 Cerebral infarction, unspecified: Secondary | ICD-10-CM | POA: Diagnosis not present

## 2016-09-26 DIAGNOSIS — K5901 Slow transit constipation: Secondary | ICD-10-CM

## 2016-09-26 DIAGNOSIS — R0602 Shortness of breath: Secondary | ICD-10-CM

## 2016-09-26 DIAGNOSIS — E871 Hypo-osmolality and hyponatremia: Secondary | ICD-10-CM | POA: Diagnosis not present

## 2016-09-26 DIAGNOSIS — M792 Neuralgia and neuritis, unspecified: Secondary | ICD-10-CM

## 2016-09-26 DIAGNOSIS — E1143 Type 2 diabetes mellitus with diabetic autonomic (poly)neuropathy: Secondary | ICD-10-CM | POA: Diagnosis not present

## 2016-09-26 DIAGNOSIS — I6381 Other cerebral infarction due to occlusion or stenosis of small artery: Secondary | ICD-10-CM | POA: Diagnosis present

## 2016-09-26 LAB — CBC
HEMATOCRIT: 40.2 % (ref 36.0–46.0)
Hemoglobin: 13.5 g/dL (ref 12.0–15.0)
MCH: 29.4 pg (ref 26.0–34.0)
MCHC: 33.6 g/dL (ref 30.0–36.0)
MCV: 87.6 fL (ref 78.0–100.0)
PLATELETS: 211 10*3/uL (ref 150–400)
RBC: 4.59 MIL/uL (ref 3.87–5.11)
RDW: 13.8 % (ref 11.5–15.5)
WBC: 7 10*3/uL (ref 4.0–10.5)

## 2016-09-26 LAB — CREATININE, SERUM
Creatinine, Ser: 1.5 mg/dL — ABNORMAL HIGH (ref 0.44–1.00)
GFR, EST AFRICAN AMERICAN: 34 mL/min — AB (ref >60)
GFR, EST NON AFRICAN AMERICAN: 29 mL/min — AB (ref >60)

## 2016-09-26 LAB — GLUCOSE, CAPILLARY
GLUCOSE-CAPILLARY: 142 mg/dL — AB (ref 65–99)
GLUCOSE-CAPILLARY: 168 mg/dL — AB (ref 65–99)
Glucose-Capillary: 223 mg/dL — ABNORMAL HIGH (ref 65–99)
Glucose-Capillary: 163 mg/dL — ABNORMAL HIGH (ref 65–99)

## 2016-09-26 LAB — HEMOGLOBIN A1C
Hgb A1c MFr Bld: 5.2 % (ref 4.8–5.6)
MEAN PLASMA GLUCOSE: 103 mg/dL

## 2016-09-26 MED ORDER — PREGABALIN 75 MG PO CAPS
75.0000 mg | ORAL_CAPSULE | Freq: Two times a day (BID) | ORAL | Status: DC
  Administered 2016-09-26 – 2016-10-11 (×30): 75 mg via ORAL
  Filled 2016-09-26 (×27): qty 1
  Filled 2016-09-26: qty 3
  Filled 2016-09-26 (×2): qty 1

## 2016-09-26 MED ORDER — LISINOPRIL 40 MG PO TABS
40.0000 mg | ORAL_TABLET | Freq: Two times a day (BID) | ORAL | Status: DC
  Administered 2016-09-26 – 2016-10-11 (×30): 40 mg via ORAL
  Filled 2016-09-26 (×30): qty 1

## 2016-09-26 MED ORDER — ONDANSETRON HCL 4 MG/2ML IJ SOLN: 4.0000 mg | Freq: Four times a day (QID) | INTRAMUSCULAR | Status: DC | PRN

## 2016-09-26 MED ORDER — FENOFIBRATE 160 MG PO TABS
160.0000 mg | ORAL_TABLET | Freq: Every day | ORAL | Status: DC
  Administered 2016-09-27 – 2016-10-11 (×15): 160 mg via ORAL
  Filled 2016-09-26 (×15): qty 1

## 2016-09-26 MED ORDER — MELATONIN 3 MG PO TABS
3.0000 mg | ORAL_TABLET | Freq: Every day | ORAL | Status: DC
  Administered 2016-09-26 – 2016-10-10 (×15): 3 mg via ORAL
  Filled 2016-09-26 (×15): qty 1

## 2016-09-26 MED ORDER — NIACIN 500 MG PO TABS
250.0000 mg | ORAL_TABLET | Freq: Every day | ORAL | Status: DC
  Administered 2016-09-27 – 2016-10-06 (×9): 250 mg via ORAL
  Filled 2016-09-26 (×16): qty 1

## 2016-09-26 MED ORDER — BUSPIRONE HCL 5 MG PO TABS
5.0000 mg | ORAL_TABLET | Freq: Two times a day (BID) | ORAL | Status: DC
  Administered 2016-09-26 – 2016-10-11 (×30): 5 mg via ORAL
  Filled 2016-09-26 (×30): qty 1

## 2016-09-26 MED ORDER — ISOSORBIDE MONONITRATE ER 30 MG PO TB24
30.0000 mg | ORAL_TABLET | Freq: Every day | ORAL | Status: DC
  Administered 2016-09-27 – 2016-09-28 (×2): 30 mg via ORAL
  Filled 2016-09-26 (×2): qty 1

## 2016-09-26 MED ORDER — CLOPIDOGREL BISULFATE 75 MG PO TABS: 75.0000 mg | ORAL_TABLET | Freq: Every day | ORAL | Status: DC

## 2016-09-26 MED ORDER — INSULIN ASPART 100 UNIT/ML ~~LOC~~ SOLN
0.0000 [IU] | Freq: Every day | SUBCUTANEOUS | Status: DC
  Administered 2016-09-29: 2 [IU] via SUBCUTANEOUS

## 2016-09-26 MED ORDER — SENNOSIDES-DOCUSATE SODIUM 8.6-50 MG PO TABS
1.0000 | ORAL_TABLET | Freq: Two times a day (BID) | ORAL | Status: DC
  Administered 2016-09-26: 1 via ORAL
  Filled 2016-09-26: qty 1

## 2016-09-26 MED ORDER — SORBITOL 70 % SOLN
30.0000 mL | Freq: Every day | Status: DC | PRN
  Administered 2016-10-04: 30 mL via ORAL
  Filled 2016-09-26: qty 30

## 2016-09-26 MED ORDER — INSULIN ASPART 100 UNIT/ML ~~LOC~~ SOLN
0.0000 [IU] | Freq: Three times a day (TID) | SUBCUTANEOUS | Status: DC
  Administered 2016-09-27: 2 [IU] via SUBCUTANEOUS
  Administered 2016-09-27: 1 [IU] via SUBCUTANEOUS
  Administered 2016-09-27 – 2016-09-28 (×2): 3 [IU] via SUBCUTANEOUS
  Administered 2016-09-28 (×2): 2 [IU] via SUBCUTANEOUS
  Administered 2016-09-29 (×2): 3 [IU] via SUBCUTANEOUS
  Administered 2016-09-29: 2 [IU] via SUBCUTANEOUS
  Administered 2016-09-30: 5 [IU] via SUBCUTANEOUS
  Administered 2016-09-30: 2 [IU] via SUBCUTANEOUS
  Administered 2016-09-30: 7 [IU] via SUBCUTANEOUS
  Administered 2016-10-01: 5 [IU] via SUBCUTANEOUS
  Administered 2016-10-01: 1 [IU] via SUBCUTANEOUS
  Administered 2016-10-01: 2 [IU] via SUBCUTANEOUS
  Administered 2016-10-02: 5 [IU] via SUBCUTANEOUS
  Administered 2016-10-02: 2 [IU] via SUBCUTANEOUS
  Administered 2016-10-03: 1 [IU] via SUBCUTANEOUS
  Administered 2016-10-03: 5 [IU] via SUBCUTANEOUS
  Administered 2016-10-04 (×2): 1 [IU] via SUBCUTANEOUS
  Administered 2016-10-04: 2 [IU] via SUBCUTANEOUS
  Administered 2016-10-05: 3 [IU] via SUBCUTANEOUS
  Administered 2016-10-05 – 2016-10-06 (×3): 1 [IU] via SUBCUTANEOUS
  Administered 2016-10-06 – 2016-10-08 (×4): 2 [IU] via SUBCUTANEOUS
  Administered 2016-10-09: 3 [IU] via SUBCUTANEOUS
  Administered 2016-10-09 – 2016-10-10 (×2): 2 [IU] via SUBCUTANEOUS
  Administered 2016-10-10: 1 [IU] via SUBCUTANEOUS

## 2016-09-26 MED ORDER — CLONIDINE HCL 0.2 MG PO TABS: 0.1000 mg | ORAL_TABLET | Freq: Two times a day (BID) | ORAL | Status: DC

## 2016-09-26 MED ORDER — SENNOSIDES-DOCUSATE SODIUM 8.6-50 MG PO TABS
1.0000 | ORAL_TABLET | Freq: Two times a day (BID) | ORAL | Status: DC
  Administered 2016-09-26 – 2016-10-05 (×18): 1 via ORAL
  Filled 2016-09-26 (×18): qty 1

## 2016-09-26 MED ORDER — ONDANSETRON HCL 4 MG PO TABS: 4.0000 mg | ORAL_TABLET | Freq: Four times a day (QID) | ORAL | Status: DC | PRN

## 2016-09-26 MED ORDER — POLYETHYLENE GLYCOL 3350 17 G PO PACK: 17.0000 g | PACK | Freq: Every day | ORAL | Status: DC | PRN

## 2016-09-26 MED ORDER — POLYETHYLENE GLYCOL 3350 17 G PO PACK
17.0000 g | PACK | Freq: Every day | ORAL | Status: DC | PRN
  Administered 2016-09-27 – 2016-10-04 (×2): 17 g via ORAL
  Filled 2016-09-26: qty 1

## 2016-09-26 MED ORDER — METOPROLOL SUCCINATE ER 50 MG PO TB24
50.0000 mg | ORAL_TABLET | Freq: Every day | ORAL | Status: DC
  Administered 2016-09-26 – 2016-09-28 (×3): 50 mg via ORAL
  Filled 2016-09-26 (×4): qty 1

## 2016-09-26 MED ORDER — ENOXAPARIN SODIUM 40 MG/0.4ML ~~LOC~~ SOLN: 40.0000 mg | SUBCUTANEOUS | Status: DC

## 2016-09-26 MED ORDER — EZETIMIBE 10 MG PO TABS: 10.0000 mg | ORAL_TABLET | Freq: Every day | ORAL | Status: DC

## 2016-09-26 MED ORDER — RANOLAZINE ER 500 MG PO TB12
500.0000 mg | ORAL_TABLET | Freq: Two times a day (BID) | ORAL | Status: DC
Start: 2016-09-26 — End: 2016-10-11
  Administered 2016-09-26 – 2016-10-11 (×30): 500 mg via ORAL
  Filled 2016-09-26 (×30): qty 1

## 2016-09-26 MED ORDER — ENOXAPARIN SODIUM 40 MG/0.4ML ~~LOC~~ SOLN
40.0000 mg | SUBCUTANEOUS | Status: DC
  Administered 2016-09-27 – 2016-09-28 (×2): 40 mg via SUBCUTANEOUS
  Filled 2016-09-26 (×2): qty 0.4

## 2016-09-26 MED ORDER — CLOPIDOGREL BISULFATE 75 MG PO TABS
75.0000 mg | ORAL_TABLET | Freq: Every day | ORAL | Status: DC
  Administered 2016-09-27 – 2016-10-11 (×15): 75 mg via ORAL
  Filled 2016-09-26 (×15): qty 1

## 2016-09-26 MED ORDER — AMLODIPINE BESYLATE 5 MG PO TABS
5.0000 mg | ORAL_TABLET | Freq: Two times a day (BID) | ORAL | Status: DC
  Administered 2016-09-26 – 2016-10-01 (×10): 5 mg via ORAL
  Filled 2016-09-26 (×10): qty 1

## 2016-09-26 MED ORDER — EZETIMIBE 10 MG PO TABS
10.0000 mg | ORAL_TABLET | Freq: Every day | ORAL | Status: DC
  Administered 2016-09-27 – 2016-10-11 (×15): 10 mg via ORAL
  Filled 2016-09-26 (×15): qty 1

## 2016-09-26 NOTE — H&P (Signed)
Physical Medicine and Rehabilitation Admission H&P    Chief Complaint  Patient presents with  . Altered Mental Status  : HPI: Charlene Greene is a 82 y.o. right handed female with history of diabetes mellitus, hypertension,SSS with pacemaker maintain on aspirin 325 mg daily. Patient is a resident at [facility] where she has been for the past 2 and half years. Presented 09/23/2016 with left-sided weakness 3-4 days and difficulty naming objects. At baseline patient was independent with assistive device prior to admission. CT of the head showed abnormal appearance of the right basal ganglia region that could represent a combination of old an acute infarct. Patient did not receive TPA. CT angiogram of head and neck with no emergent large vessel occlusion. MRI not completed secondary to pacemaker. Echocardiogram with ejection fraction of 70% grade 1 diastolic function no regional wall motion abnormalities. Presently maintained on Plavix for CVA prophylaxis. Subcutaneous Lovenox for DVT prophylaxis. Tolerating a regular diet. Physical and occupational therapy evaluations completed with recommendations of physical medicine rehabilitation consult.Patient was admitted for comprehensive rehabilitation program  ROS HENT: Negative for hearing loss and tinnitus.   Eyes: Negative for blurred vision and double vision.  Respiratory: Negative for cough and shortness of breath.  Gastrointestinal: Positive for constipation. Negative for nausea and vomiting.  Genitourinary: Negative for dysuria and hematuria.  Musculoskeletal: Positive for joint pain and myalgias.  Skin: Negative for rash.  Neurological: Positive for speech change and focal weakness. Psychiatric. Depression/anxiety/insomnia  All other systems reviewed and are negative   Past Medical History:  Diagnosis Date  . Diabetes mellitus without complication (HCC)   . Hypertension   . Pacemaker    Past Surgical History:    Procedure Laterality Date  . PACEMAKER PLACEMENT     History reviewed. No pertinent family history. Social History:  reports that she has never smoked. She has never used smokeless tobacco. She reports that she does not drink alcohol. Her drug history is not on file. Allergies:  Allergies  Allergen Reactions  . Statins     myalgia   Medications Prior to Admission  Medication Sig Dispense Refill  . amLODipine (NORVASC) 5 MG tablet Take 5 mg by mouth 2 (two) times daily.    Marland Kitchen. aspirin 325 MG EC tablet Take 325 mg by mouth daily.    Marland Kitchen. BL MAGNESIUM PO Take 1 tablet by mouth daily.    . busPIRone (BUSPAR) 5 MG tablet Take 5 mg by mouth 2 (two) times daily.    . calcium carbonate (OSCAL) 1500 (600 Ca) MG TABS tablet Take 600 mg of elemental calcium by mouth 2 (two) times daily with a meal.    . cloNIDine (CATAPRES) 0.2 MG tablet Take 0.2 mg by mouth 2 (two) times daily.    . fenofibrate 160 MG tablet Take 160 mg by mouth daily.    Marland Kitchen. glimepiride (AMARYL) 2 MG tablet Take 2-3 mg by mouth 2 (two) times daily. 1 tab in the am and 1/2 hs    . isosorbide mononitrate (IMDUR) 60 MG 24 hr tablet Take 60 mg by mouth daily.    Marland Kitchen. lisinopril (PRINIVIL,ZESTRIL) 40 MG tablet Take 40 mg by mouth 2 (two) times daily.    . Melatonin 3 MG TABS Take 3 mg by mouth at bedtime.    . metoprolol succinate (TOPROL-XL) 100 MG 24 hr tablet Take 100 mg by mouth daily.     . Multiple Vitamin (MULTIVITAMIN WITH MINERALS) TABS tablet Take 1 tablet by  mouth daily.    . Multiple Vitamins-Minerals (PRESERVISION AREDS 2 PO) Take 1 capsule by mouth 2 (two) times daily.    . niacin 250 MG tablet Take 250 mg by mouth every morning.    . Omega-3 Fatty Acids (FISH OIL) 1000 MG CPDR Take 2,000 capsules by mouth 2 (two) times daily.    . pregabalin (LYRICA) 75 MG capsule Take 75 mg by mouth 2 (two) times daily.    . ranolazine (RANEXA) 500 MG 12 hr tablet Take 500 mg by mouth 2 (two) times daily.      Home: Home  Living Family/patient expects to be discharged to:: Inpatient rehab Living Arrangements: Alone Available Help at Discharge: Family, Available 24 hours/day Type of Home: Apartment Home Access: Elevator Home Layout: One level Bathroom Shower/Tub: Health visitorWalk-in shower Bathroom Toilet: Handicapped height Bathroom Accessibility: Yes Home Equipment: Grab bars - toilet, Grab bars - tub/shower, Shower seat - built in, Environmental consultantWalker - 4 wheels, Environmental consultantWalker - 2 wheels  Lives With: Alone   Functional History: Prior Function Level of Independence: Independent with assistive device(s) Comments: Used a rollator all the time  Functional Status:  Mobility: Bed Mobility Overal bed mobility: Needs Assistance Bed Mobility: Rolling, Sidelying to Sit Rolling: Min assist Sidelying to sit: Mod assist General bed mobility comments: In chair on arrival.  Transfers Overall transfer level: Needs assistance Equipment used: Rolling walker (2 wheeled) Transfers: Sit to/from Stand Sit to Stand: Mod assist Stand pivot transfers: Min assist, +2 safety/equipment General transfer comment: cues for hand placement Ambulation/Gait Ambulation/Gait assistance: Min assist Ambulation Distance (Feet): 75 Feet Assistive device: Rolling walker (2 wheeled) Gait Pattern/deviations: Step-through pattern, Decreased stride length, Trunk flexed, Narrow base of support General Gait Details: Slow and guarded gait. Pt was cued for improved posture and forward gaze throughout gait training. Chair follow required.  Gait velocity: Decreased Gait velocity interpretation: Below normal speed for age/gender    ADL: ADL Overall ADL's : Needs assistance/impaired Grooming: Wash/dry hands, Minimal assistance, Standing Grooming Details (indicate cue type and reason): requries single UE support at sink Upper Body Dressing : Moderate assistance Lower Body Dressing: Maximal assistance Lower Body Dressing Details (indicate cue type and reason): don mess  panties during session Toilet Transfer: Moderate assistance Toileting- Clothing Manipulation and Hygiene: Maximal assistance Functional mobility during ADLs: Minimal assistance General ADL Comments: pt reports urgency to void bladder since admission. pt requesting gramily bring adult briefs ( son present and notified)  Cognition: Cognition Overall Cognitive Status: Impaired/Different from baseline Orientation Level: Oriented X4 Cognition Arousal/Alertness: Awake/alert Behavior During Therapy: Flat affect Overall Cognitive Status: Impaired/Different from baseline Memory: Decreased short-term memory  Physical Exam: Blood pressure 101/60, pulse 92, temperature 97.9 F (36.6 C), temperature source Oral, resp. rate 20, height 5\' 2"  (1.575 m), weight 83 kg (183 lb), SpO2 97 %. Physical Exam  Vitals reviewed. Constitutional: She is oriented to person, place, and time. She appears well-developed and well-nourished.  HENT:  Head: Normocephalic and atraumatic.  Eyes: EOM are normal. Right eye exhibits no discharge. Left eye exhibits no discharge.  Neck: Normal range of motion. Neck supple.  Cardiovascular: Normal rate and regular rhythm.   Respiratory: Effort normal and breath sounds normal.  GI: Soft. Bowel sounds are normal.  Musculoskeletal: She exhibits no edema or tenderness.  Neurological: She is alert and oriented to person, place, and time.  Follows commands.  Fair awareness of deficits. Motor: RUE/RLE: 4+/5 proximal to distal LUE: Apraxia 4-/5 proxim al to distal LLE: HF, KE 3/5, ADF/PF  2-/5 Sensation intact to light touch DTRs symmetric   Skin: Skin is warm and dry.  Psychiatric: She has a normal mood and affect. Her behavior is normal.    Results for orders placed or performed during the hospital encounter of 09/23/16 (from the past 48 hour(s))  Glucose, capillary     Status: Abnormal   Collection Time: 09/25/16 11:45 AM  Result Value Ref Range   Glucose-Capillary 216  (H) 65 - 99 mg/dL   Comment 1 Notify RN    Comment 2 Document in Chart   Hemoglobin A1c     Status: None   Collection Time: 09/25/16 12:59 PM  Result Value Ref Range   Hgb A1c MFr Bld 5.2 4.8 - 5.6 %    Comment: (NOTE)         Pre-diabetes: 5.7 - 6.4         Diabetes: >6.4         Glycemic control for adults with diabetes: <7.0    Mean Plasma Glucose 103 mg/dL    Comment: (NOTE) Performed At: [facility] [address] [place], [place] [id number] Mila Homer MD [id number]   Glucose, capillary     Status: Abnormal   Collection Time: 09/25/16  4:28 PM  Result Value Ref Range   Glucose-Capillary 177 (H) 65 - 99 mg/dL   Comment 1 Notify RN    Comment 2 Document in Chart   Glucose, capillary     Status: Abnormal   Collection Time: 09/25/16  9:25 PM  Result Value Ref Range   Glucose-Capillary 145 (H) 65 - 99 mg/dL   Comment 1 Notify RN    Comment 2 Document in Chart   Glucose, capillary     Status: Abnormal   Collection Time: 09/26/16  6:40 AM  Result Value Ref Range   Glucose-Capillary 142 (H) 65 - 99 mg/dL   Comment 1 Notify RN    Comment 2 Document in Chart   Glucose, capillary     Status: Abnormal   Collection Time: 09/26/16 11:48 AM  Result Value Ref Range   Glucose-Capillary 223 (H) 65 - 99 mg/dL   No results found.   Medical Problem List and Plan: 1.  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  DVT Prophylaxis/Anticoagulation: Subcutaneous Lovenox. Monitor platelet counts and any signs of bleeding 3. Pain Management: Lyrica 75 mg twice a day 4. Mood: BuSpar 5 mg twice a day, melatonin 3 mg daily at bedtime 5. Neuropsych: This patient is capable of making decisions on her own behalf. 6. Skin/Wound Care: Routine skin checks 7. Fluids/Electrolytes/Nutrition: Routine I&O with follow-up chemistries 8. Sick sinus syndrome with pacemaker. Cardiac regular control 9. Hypertension. Imdur 30 mg daily,  Toprol-XL 50 mg daily, Ranexa 500 mg twice a day, Norvasc 5 mg twice a day, lisinopril 40 mg twice a day 10. Diabetes mellitus with peripheral neuropathy. Hemoglobin A1c 5.2.SSI. Check blood sugars before meals and at bedtime. Patient on Amaryl 2 mg twice daily. Resume as tolerated 11. Hyperlipidemia. Fenofibrate 160 mg daily,Zetia 10 mg daily. 12. Constipation. Laxative assistance 13? CKD vs. AKI: Follow BMP  Post Admission Physician Evaluation: 1. Preadmission assessment reviewed and changes made below. 2. Functional deficits secondary  to right basal ganglia infarct. 3. Patient is admitted to receive collaborative, interdisciplinary care between the physiatrist, rehab nursing staff, and therapy team. 4. Patient's level of medical complexity and substantial therapy needs in context of that medical necessity cannot be  provided at a lesser intensity of care such as a SNF. 5. Patient has experienced substantial functional loss from his/her baseline which was documented above under the "Functional History" and "Functional Status" headings.  Judging by the patient's diagnosis, physical exam, and functional history, the patient has potential for functional progress which will result in measurable gains while on inpatient rehab.  These gains will be of substantial and practical use upon discharge  in facilitating mobility and self-care at the household level. 6. Physiatrist will provide 24 hour management of medical needs as well as oversight of the therapy plan/treatment and provide guidance as appropriate regarding the interaction of the two. 7. The Preadmission Screening has been reviewed and patient status is unchanged unless otherwise stated above. 8. 24 hour rehab nursing will assist with bladder management, safety and patient education  and help integrate therapy concepts, techniques,education, etc. 9. PT will assess and treat for/with: Lower extremity strength, range of motion, stamina, balance,  functional mobility, safety, adaptive techniques and equipment, coping skills, pain control, stroke education.   Goals are: Supervision/Mod I. 10. OT will assess and treat for/with: ADL's, functional mobility, safety, upper extremity strength, adaptive techniques and equipment, ego support, and community reintegration.   Goals are: Supvervision. Therapy  may proceed with showering this patient. 11. Case Management and Social Worker will assess and treat for psychological issues and discharge planning. 12. Team conference will be held weekly to assess progress toward goals and to determine barriers to discharge. 13. Patient will receive at least 3 hours of therapy per day at least 5 days per week. 14. ELOS: 15-19 days.       15. Prognosis:  good  Maryla Morrow, MD, [place] Dom 09/26/2016

## 2016-09-26 NOTE — Progress Notes (Signed)
Physical Therapy Treatment Patient Details Name: Charlene Greene MRN: [id number] DOB: 05/11/1922 Today's Date: 09/26/2016    History of Present Illness Pt is a 82 y/o female who currently resides in an independent living facility. Pt presents with a history of hypertension, hyperlipidemia, sick sinus syndrome status post pacemaker, prior CAD, diabetes mellitus who presented to ED with left-sided weakness for past 3-4 days and slurred speech, facial drooping. CT head showed abnormal appearance of right basal ganglia region combination of old an acute infarction.    PT Comments    Pt progressing towards physical therapy goals. Continues to require assist and cues for functional mobility. Chair follow helpful for gait training. Feel CIR is most appropriate d/c disposition at this time. Will continue to follow and progress as able per POC.   Follow Up Recommendations  CIR;Supervision/Assistance - 24 hour     Equipment Recommendations  None recommended by PT    Recommendations for Other Services Rehab consult     Precautions / Restrictions Precautions Precautions: Fall Restrictions Weight Bearing Restrictions: No    Mobility  Bed Mobility               General bed mobility comments: In chair on arrival.   Transfers Overall transfer level: Needs assistance Equipment used: Rolling walker (2 wheeled) Transfers: Sit to/from Stand Sit to Stand: Mod assist         General transfer comment: cues for hand placement  Ambulation/Gait Ambulation/Gait assistance: Min assist Ambulation Distance (Feet): 75 Feet Assistive device: Rolling walker (2 wheeled) Gait Pattern/deviations: Step-through pattern;Decreased stride length;Trunk flexed;Narrow base of support Gait velocity: Decreased Gait velocity interpretation: Below normal speed for age/gender General Gait Details: Slow and guarded gait. Pt was cued for improved posture and forward gaze throughout gait training. Chair follow  required.    Stairs            Wheelchair Mobility    Modified Rankin (Stroke Patients Only)       Balance Overall balance assessment: Needs assistance Sitting-balance support: Feet supported;No upper extremity supported Sitting balance-Leahy Scale: Fair   Postural control: Left lateral lean Standing balance support: Bilateral upper extremity supported;During functional activity Standing balance-Leahy Scale: Poor                      Cognition Arousal/Alertness: Awake/alert Behavior During Therapy: Flat affect Overall Cognitive Status: Impaired/Different from baseline       Memory: Decreased short-term memory              Exercises      General Comments        Pertinent Vitals/Pain Pain Assessment: Faces Faces Pain Scale: Hurts little more Pain Location: L shoulder  Pain Descriptors / Indicators: Sore Pain Intervention(s): Monitored during session;Premedicated before session    Home Living Family/patient expects to be discharged to:: Inpatient rehab Living Arrangements: Alone Available Help at Discharge: Family;Available 24 hours/day Type of Home: Apartment Home Access: Elevator   Home Layout: One level Home Equipment: Grab bars - toilet;Grab bars - tub/shower;Shower seat - built in;Walker - 4 wheels;Walker - 2 wheels      Prior Function Level of Independence: Independent with assistive device(s)      Comments: Used a rollator all the time   PT Goals (current goals can now be found in the care plan section) Acute Rehab PT Goals Patient Stated Goal: Return to her independent living apartment PT Goal Formulation: With patient/family Time For Goal Achievement: 10/09/16 Potential to Achieve Goals:  Good Progress towards PT goals: Progressing toward goals    Frequency    Min 4X/week      PT Plan Current plan remains appropriate    Co-evaluation PT/OT/SLP Co-Evaluation/Treatment: Yes Reason for Co-Treatment: For  patient/therapist safety PT goals addressed during session: Mobility/safety with mobility;Balance;Proper use of DME OT goals addressed during session: ADL's and self-care;Strengthening/ROM     End of Session Equipment Utilized During Treatment: Gait belt Activity Tolerance: Patient tolerated treatment well Patient left: in chair;with call bell/phone within reach;with family/visitor present     Time: [id number] PT Time Calculation (min) (ACUTE ONLY): 39 min  Charges:  $Gait Training: 8-22 mins $Therapeutic Activity: 8-22 mins                    G Codes:      Marylynn PearsonLaura D Bevan Vu 09/26/2016, 12:14 PM   Conni SlipperLaura Danish Ruffins, PT, DPT Acute Rehabilitation Services Pager: [phone]

## 2016-09-26 NOTE — Evaluation (Signed)
Occupational Therapy Evaluation Patient Details Name: Charlene Greene MRN: [id number] DOB: 1922/10/19 Today's Date: 09/26/2016    History of Present Illness Pt is a 82 y/o female who currently resides in an independent living facility. Pt presents with a history of hypertension, hyperlipidemia, sick sinus syndrome status post pacemaker, prior CAD, diabetes mellitus who presented to ED with left-sided weakness for past 3-4 days and slurred speech, facial drooping. CT head showed abnormal appearance of right basal ganglia region combination of old an acute infarction.   Clinical Impression   PT admitted with R basal ganglia infarct.. Pt currently with functional limitiations due to the deficits listed below (see OT problem list). PTA was at ALF using rollator and mod I with all adls. Pt could benefit from CIR to reach MOD I level again.  Pt will benefit from skilled OT to increase their independence and safety with adls and balance to allow discharge CIR.     Follow Up Recommendations  CIR    Equipment Recommendations  Other (comment) (defer to next venue)    Recommendations for Other Services Rehab consult     Precautions / Restrictions Precautions Precautions: Fall      Mobility Bed Mobility               General bed mobility comments: in chair on arrival  Transfers Overall transfer level: Needs assistance Equipment used: Rolling walker (2 wheeled) Transfers: Sit to/from Stand Sit to Stand: Mod assist         General transfer comment: cues for hand placement    Balance                                            ADL Overall ADL's : Needs assistance/impaired     Grooming: Wash/dry hands;Minimal assistance;Standing Grooming Details (indicate cue type and reason): requries single UE support at sink         Upper Body Dressing : Moderate assistance   Lower Body Dressing: Maximal assistance Lower Body Dressing Details (indicate cue type  and reason): don mess panties during session Toilet Transfer: Moderate assistance   Toileting- Clothing Manipulation and Hygiene: Maximal assistance       Functional mobility during ADLs: Minimal assistance General ADL Comments: pt reports urgency to void bladder since admission. pt requesting gramily bring adult briefs ( son present and notified)     Vision Additional Comments: pt able to read menu in room and recalled lunch order. pt able to read exit sign at a disance. pt able to determine number of cones stack in hallway. Pt does not appear to have visual deficits but OT to continue to assess   Perception Perception Perception Tested?: Yes Perception Deficits: Inattention/neglect Inattention/Neglect: Does not attend to left side of body Comments: needs cues to move L UE at times during session. decr abiliyt to perform peri hygiene with L    Praxis      Pertinent Vitals/Pain Pain Assessment: Faces Faces Pain Scale: Hurts little more Pain Location: L shoulder  Pain Descriptors / Indicators: Sore Pain Intervention(s): Monitored during session;Premedicated before session;Repositioned     Hand Dominance Right   Extremity/Trunk Assessment Upper Extremity Assessment Upper Extremity Assessment: LUE deficits/detail LUE Deficits / Details: baseline deficits with shoulder flexion per son for years. Pt able to complete shoulder flexion 90 degrees 3 out 5 MMT  LUE Coordination: decreased gross motor  Lower Extremity Assessment Lower Extremity Assessment: Defer to PT evaluation   Cervical / Trunk Assessment Cervical / Trunk Assessment: Kyphotic   Communication Communication Communication: No difficulties   Cognition Arousal/Alertness: Awake/alert Behavior During Therapy: Flat affect Overall Cognitive Status: Impaired/Different from baseline       Memory: Decreased short-term memory             General Comments       Exercises       Shoulder Instructions       Home Living Family/patient expects to be discharged to:: Inpatient rehab Living Arrangements: Alone Available Help at Discharge: Family;Available 24 hours/day Type of Home: Apartment Home Access: Elevator     Home Layout: One level     Bathroom Shower/Tub: Producer, television/film/videoWalk-in shower   Bathroom Toilet: Handicapped height Bathroom Accessibility: Yes   Home Equipment: Grab bars - toilet;Grab bars - tub/shower;Shower seat - built in;Walker - 4 wheels;Walker - 2 wheels      Lives With: Alone    Prior Functioning/Environment Level of Independence: Independent with assistive device(s)        Comments: Used a rollator all the time        OT Problem List: Decreased strength;Decreased activity tolerance;Impaired balance (sitting and/or standing);Decreased safety awareness;Decreased knowledge of use of DME or AE;Decreased knowledge of precautions;Cardiopulmonary status limiting activity;Pain;Obesity;Decreased cognition   OT Treatment/Interventions: Self-care/ADL training;Therapeutic exercise;Neuromuscular education;DME and/or AE instruction;Therapeutic activities;Patient/family education;Balance training    OT Goals(Current goals can be found in the care plan section) Acute Rehab OT Goals Patient Stated Goal: Return to her independent living apartment OT Goal Formulation: With patient Time For Goal Achievement: 10/10/16 Potential to Achieve Goals: Good  OT Frequency: Min 2X/week   Barriers to D/C:            Co-evaluation PT/OT/SLP Co-Evaluation/Treatment: Yes Reason for Co-Treatment: For patient/therapist safety   OT goals addressed during session: ADL's and self-care;Strengthening/ROM      End of Session Equipment Utilized During Treatment: Gait belt;Rolling walker Nurse Communication: Mobility status;Precautions  Activity Tolerance: Patient tolerated treatment well Patient left: in chair;with call bell/phone within reach;with chair alarm set;with family/visitor present    Time: [id number] OT Time Calculation (min): 37 min Charges:  OT General Charges $OT Visit: 1 Procedure OT Evaluation $OT Eval Moderate Complexity: 1 Procedure G-Codes:    Boone MasterJones, Anastasio Wogan B 09/26/2016, 10:15 AM   Mateo FlowJones, Brynn   OTR/L Pager: [phone]: [phone] Office: [phone] .

## 2016-09-26 NOTE — PMR Pre-admission (Signed)
PMR Admission Coordinator Pre-Admission Assessment  Patient: Charlene Greene is an 82 y.o., female MRN: [id number] DOB: 1922-07-27 Height: 5\' 2"  (157.5 cm) Weight: 83 kg (183 lb)              Insurance Information HMO:     PPO:      PCP:      IPA:      80/20:      OTHER:  PRIMARY: Medicare A & B      Policy#: [id number]      Subscriber: Self CM Name:       Phone#:      Fax#:  Pre-Cert#: eligible via [employer] online portal       Employer: retired Benefits:  Phone #:      Name:  Eff. Date: 08/12/1989     Deduct: $1,316      Out of Pocket Max: none      Life Max: unlimited  CIR: 100%      SNF: 100% days 1-20; 80% days 21-100 Outpatient: 80%      Co-Pay: 20% Home Health: 100%      Co-Pay: none  DME: 80%     Co-Pay: 20% Providers: patient's choice  SECONDARY: GEHA      Policy#: [id number] geha      Subscriber: Self CM Name:       Phone#:      Fax#:  Pre-Cert#:       Employer: retired Benefits:  Phone #: [phone]     Name: self Eff. 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 Patient is a resident at [facility] independent [facility] where she has been for the past 2 and half years. Presented 09/23/2016 with left-sided weakness 3-4 days and difficulty naming objects. At baseline patient was  independent with assistive device prior to admission. CT of the head showed abnormal appearance of the right basal ganglia region that could represent a combination of old an acute infarct. Patient did not receive TPA. CT angiogram of head and neck with no emergent large vessel occlusion. MRI not completed secondary to pacemaker. Echocardiogram with ejection fraction of 70% grade 1 diastolic function no regional wall motion abnormalities. Presently maintained on Plavix for CVA prophylaxis. Subcutaneous Lovenox for DVT prophylaxis. Tolerating a regular diet. Physical and occupational therapy evaluations completed with recommendations of physical medicine rehabilitation consult. Patient was admitted for comprehensive rehabilitation program 09/26/16.  NIH Total: 1    Past Medical History  Past Medical History:  Diagnosis Date  . Diabetes mellitus without complication (HCC)   . Hypertension   . Pacemaker  Family History  family history is not on file.  Prior Rehab/Hospitalizations:  Has the patient had major surgery during 100 days prior to admission? No  Current Medications   Current Facility-Administered Medications:  .  amLODipine (NORVASC) tablet 5 mg, 5 mg, Oral, BID, Ripudeep K Rai, MD, 5 mg at 09/26/16 0921 .  busPIRone (BUSPAR) tablet 5 mg, 5 mg, Oral, BID, Rhetta Mura, MD, 5 mg at 09/26/16 0922 .  clopidogrel (PLAVIX) tablet 75 mg, 75 mg, Oral, Daily, Ripudeep K Rai, MD, 75 mg at 09/26/16 0922 .  enoxaparin (LOVENOX) injection 40 mg, 40 mg, Subcutaneous, Q24H, Rhetta Mura, MD, 40 mg at 09/25/16 1759 .  ezetimibe (ZETIA) tablet 10 mg, 10 mg, Oral, Daily, Ripudeep K Rai, MD, 10 mg at 09/26/16 1610 .  fenofibrate tablet 160 mg, 160 mg, Oral, Daily, Rhetta Mura, MD, 160 mg at 09/26/16 9604 .  hydrALAZINE (APRESOLINE) injection 10 mg, 10 mg, Intravenous, Once, Ripudeep K Rai, MD .  hydrALAZINE (APRESOLINE) injection 10 mg, 10 mg, Intravenous, Q6H PRN, Layne Benton, NP .  insulin aspart (novoLOG) injection 0-5 Units, 0-5 Units, Subcutaneous, QHS, Ripudeep K Rai, MD .  insulin aspart (novoLOG) injection 0-9 Units, 0-9 Units, Subcutaneous, TID WC, Ripudeep K Rai, MD, 3 Units at 09/26/16 1230 .  isosorbide mononitrate (IMDUR) 24 hr tablet 30 mg, 30 mg, Oral, Daily, Rhetta Mura, MD, 30 mg at 09/26/16 0922 .  lisinopril (PRINIVIL,ZESTRIL) tablet 40 mg, 40 mg, Oral, BID, Ripudeep K Rai, MD, 40 mg at 09/26/16 5409 .  Melatonin TABS 3 mg, 3 mg, Oral, QHS, Rhetta Mura, MD, 3 mg at 09/25/16 2220 .  metoprolol succinate (TOPROL-XL) 24 hr tablet 50 mg, 50 mg, Oral, Daily, Rhetta Mura, MD, 50 mg at 09/25/16 2057 .  niacin tablet 250 mg, 250 mg, Oral, Erroll Luna, RPH, 250 mg at 09/25/16 0630 .  polyethylene glycol (MIRALAX / GLYCOLAX) packet 17 g, 17 g, Oral, Daily PRN, Zannie Cove, MD .  pregabalin (LYRICA) capsule 75 mg, 75 mg, Oral, BID, Rhetta Mura, MD, 75 mg at 09/26/16 0921 .  ranolazine (RANEXA) 12 hr tablet 500 mg, 500 mg, Oral, BID, Rhetta Mura, MD, 500 mg at 09/26/16 0922 .  senna-docusate (Senokot-S) tablet 1 tablet, 1 tablet, Oral, BID, Zannie Cove, MD, 1 tablet at 09/26/16 1229  Patients Current Diet: Diet Heart Room service appropriate? Yes; Fluid consistency: Thin  Precautions / Restrictions Precautions Precautions: Fall Restrictions Weight Bearing Restrictions: No   Has the patient had 2 or more falls or a fall with injury in the past year?Yes daughter-in-law reports that for a week that patient fell 2-3 times, but did not sustain an injury.    Prior Activity Level Limited Community (1-2x/wk): Prior to admission patient lived alone at an independent living facility.  She did everything for herself except cook and drive.  She went out to eat about once a week with family.    Home Assistive Devices / Equipment Home Assistive Devices/Equipment: None Home Equipment: Grab bars - toilet,  Grab bars - tub/shower, Shower seat - built in, Environmental consultant - 4 wheels, Environmental consultant - 2 wheels  Prior Device Use: Indicate devices/aids used by the patient prior to current illness, exacerbation or injury? Walker  Prior Functional Level Prior Function Level of Independence: Independent with assistive device(s) Comments: Used a rollator all the time  Self Care: Did the patient need help bathing, dressing, using the toilet or eating?  Independent  Indoor Mobility: Did the patient need  assistance with walking from room to room (with or without device)? Independent  Stairs: Did the patient need assistance with internal or external stairs (with or without device)? Dependent  Functional Cognition: Did the patient need help planning regular tasks such as shopping or remembering to take medications? Independent  Current Functional Level Cognition  Overall Cognitive Status: Impaired/Different from baseline Orientation Level: Oriented X4    Extremity Assessment (includes Sensation/Coordination)  Upper Extremity Assessment: LUE deficits/detail LUE Deficits / Details: baseline deficits with shoulder flexion per son for years. Pt able to complete shoulder flexion 90 degrees 3 out 5 MMT  LUE Coordination: decreased gross motor  Lower Extremity Assessment: Defer to PT evaluation LLE Deficits / Details: Decreased strength and AROM grossly. Hip flexors 3/5, quads 4-/5, hamstrings 4-/5 LLE Sensation: decreased light touch, history of peripheral neuropathy (Bilaterally lower legs and feet)    ADLs  Overall ADL's : Needs assistance/impaired Grooming: Wash/dry hands, Minimal assistance, Standing Grooming Details (indicate cue type and reason): requries single UE support at sink Upper Body Dressing : Moderate assistance Lower Body Dressing: Maximal assistance Lower Body Dressing Details (indicate cue type and reason): don mess panties during session Toilet Transfer: Moderate assistance Toileting- Clothing  Manipulation and Hygiene: Maximal assistance Functional mobility during ADLs: Minimal assistance General ADL Comments: pt reports urgency to void bladder since admission. pt requesting gramily bring adult briefs ( son present and notified)    Mobility  Overal bed mobility: Needs Assistance Bed Mobility: Rolling, Sidelying to Sit Rolling: Min assist Sidelying to sit: Mod assist General bed mobility comments: In chair on arrival.     Transfers  Overall transfer level: Needs assistance Equipment used: Rolling walker (2 wheeled) Transfers: Sit to/from Stand Sit to Stand: Mod assist Stand pivot transfers: Min assist, +2 safety/equipment General transfer comment: cues for hand placement    Ambulation / Gait / Stairs / Wheelchair Mobility  Ambulation/Gait Ambulation/Gait assistance: Min assist Ambulation Distance (Feet): 75 Feet Assistive device: Rolling walker (2 wheeled) Gait Pattern/deviations: Step-through pattern, Decreased stride length, Trunk flexed, Narrow base of support General Gait Details: Slow and guarded gait. Pt was cued for improved posture and forward gaze throughout gait training. Chair follow required.  Gait velocity: Decreased Gait velocity interpretation: Below normal speed for age/gender    Posture / Balance Balance Overall balance assessment: Needs assistance Sitting-balance support: Feet supported, No upper extremity supported Sitting balance-Leahy Scale: Fair Postural control: Left lateral lean Standing balance support: Bilateral upper extremity supported, During functional activity Standing balance-Leahy Scale: Poor    Special needs/care consideration BiPAP/CPAP: No CPM: No Continuous Drip IV: No Dialysis: No         Life Vest: No  Oxygen: No Special Bed: No Trach Size: No Wound Vac (area): No       Skin: WDL                               Bowel mgmt: 09/23/16 Bladder mgmt: Continent, but patient reports new urgency with an accident x1 since  CVA Diabetic mgmt: Yes patient reports testing her blood sugar in the morning PTA and managed it with oral medication and diet.     Previous Home Environment Living Arrangements: Alone  Lives With: Alone Available Help at Discharge: Family, Available 24 hours/day Type of Home: Apartment Home Layout: One level Home Access: Engineer, maintenance (IT)levator Bathroom Shower/Tub: Health visitorWalk-in shower Bathroom Toilet: Handicapped height Bathroom Accessibility: Yes Home Care Services: No  Discharge Living Setting  Plans for Discharge Living Setting: Other (Comment) (Independent Living at [facility] ) Type of Home at Discharge: Other (Comment) (studio apartment ) Discharge Home Layout: One level Discharge Home Access: Elevator (2nd floor apartment ) Discharge Bathroom Shower/Tub: Walk-in shower Discharge Bathroom Toilet: Handicapped height Discharge Bathroom Accessibility: Yes How Accessible: Accessible via walker Does the patient have any problems obtaining your medications?: No  Social/Family/Support Systems Patient Roles: Parent Anticipated Caregiver: Sons and daughter-in-law Anticipated Caregiver's Contact Information: see emergency contact form  Ability/Limitations of Caregiver: They work; patient lived alone PTA Caregiver Availability: Intermittent Discharge Plan Discussed with Primary Caregiver: Yes Is Caregiver In Agreement with Plan?: Yes Does Caregiver/Family have Issues with Lodging/Transportation while Pt is in Rehab?: No  Goals/Additional Needs Patient/Family Goal for Rehab: PT/OT Mod I  Expected length of stay: 15-19 days Cultural Considerations: Attends [employer]  Dietary Needs: Carb Mod and Heart Healthy  Equipment Needs: TBD Special Service Needs: None Additional Information: None Pt/Family Agrees to Admission and willing to participate: Yes Program Orientation Provided & Reviewed with Pt/Caregiver Including Roles  & Responsibilities: Yes Additional Information Needs:  None Information Needs to be Provided By: N/A  Decrease burden of Care through IP rehab admission: No   Possible need for SNF placement upon discharge: No  Patient Condition: This patient's condition remains as documented in the consult dated 09/26/16, in which the Rehabilitation Physician determined and documented that the patient's condition is appropriate for intensive rehabilitative care in an inpatient rehabilitation facility. Will admit to inpatient rehab today.   Preadmission Screen Completed By:  Fae Pippin, 09/26/2016 4:56 PM ______________________________________________________________________   Discussed status with Dr. Allena Katz on 09/26/16 at 1702 and received telephone approval for admission today.  Admission Coordinator:  Fae Pippin, time 1702/Date 09/26/16

## 2016-09-26 NOTE — Progress Notes (Signed)
Inpatient Rehabilitation  I met with patient and daughter-in-law to discuss team's recommendation for IP Rehab.  Shared booklets and answered questions.  Patient is eager to return to caring for herself at her independent living facility.  Patient is medically ready today and we have a bed available.  Will proceed with IP Rehab admission later today.  Please call with questions.  Carmelia Roller., CCC/SLP Admission Coordinator  [facility]  Cell [phone]

## 2016-09-26 NOTE — Progress Notes (Signed)
Patient transferred to 4th floor by bed with all belongings.  Alert, verbal with no complaints.  Family supportive and at bedside.

## 2016-09-26 NOTE — Consult Note (Signed)
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 right handed female with history of diabetes mellitus, hypertension,SSS with pacemaker maintain on aspirin 325 mg daily. Patient is a resident at [facility] where she has been for the past 2 and half years. Presented 09/23/2016 with left-sided weakness 3-4 days and difficulty naming objects. At baseline patient was independent with assistive device prior to admission. CT of the head showed abnormal appearance of the right basal ganglia region that could represent a combination of old an acute infarct. Patient did not receive TPA. CT angiogram of head and neck with no emergent large vessel occlusion. MRI not completed secondary to pacemaker. Echocardiogram with ejection fraction of 70% grade 1 diastolic function no regional wall motion abnormalities. Presently maintained on Plavix for CVA prophylaxis. Subcutaneous Lovenox for DVT prophylaxis. Tolerating a regular diet. Physical and occupational therapy evaluations completed with recommendations of physical medicine rehabilitation consult.   Review of Systems  HENT: Negative for hearing loss and tinnitus.   Eyes: Negative for blurred vision and double vision.  Respiratory: Negative for cough and shortness of breath.   Cardiovascular: Positive for palpitations. Negative for chest pain and leg swelling.  Gastrointestinal: Positive for constipation. Negative for nausea and vomiting.  Genitourinary: Negative for dysuria and hematuria.  Musculoskeletal: Positive for joint pain and myalgias.  Skin: Negative for rash.  Neurological: Positive for speech change, focal weakness and weakness.  All other systems reviewed and are negative.  Past Medical History:  Diagnosis Date  . Diabetes mellitus without complication (HCC)   .  Hypertension   . Pacemaker    Past Surgical History:  Procedure Laterality Date  . PACEMAKER PLACEMENT     History reviewed. No pertinent family history. of stroke at young age. Social History:  reports that she has never smoked. She has never used smokeless tobacco. She reports that she does not drink alcohol. Her drug history is not on file. Allergies:  Allergies  Allergen Reactions  . Statins     myalgia   Medications Prior to Admission  Medication Sig Dispense Refill  . amLODipine (NORVASC) 5 MG tablet Take 5 mg by mouth 2 (two) times daily.    Marland Kitchen aspirin 325 MG EC tablet Take 325 mg by mouth daily.    Marland Kitchen BL MAGNESIUM PO Take 1 tablet by mouth daily.    . busPIRone (BUSPAR) 5 MG tablet Take 5 mg by mouth 2 (two) times daily.    . calcium carbonate (OSCAL) 1500 (600 Ca) MG TABS tablet Take 600 mg of elemental calcium by mouth 2 (two) times daily with a meal.    . cloNIDine (CATAPRES) 0.2 MG tablet Take 0.2 mg by mouth 2 (two) times daily.    . fenofibrate 160 MG tablet Take 160 mg by mouth daily.    Marland Kitchen glimepiride (AMARYL) 2 MG tablet Take 2-3 mg by mouth 2 (two) times daily. 1 tab in the am and 1/2 hs    . isosorbide mononitrate (IMDUR) 60 MG 24 hr tablet Take 60 mg by mouth daily.    Marland Kitchen lisinopril (PRINIVIL,ZESTRIL) 40 MG tablet Take 40 mg by mouth 2 (two) times daily.    . Melatonin 3 MG TABS Take 3 mg by mouth at bedtime.    . metoprolol succinate (TOPROL-XL) 100 MG 24 hr tablet Take 100 mg by mouth daily.     Marland Kitchen  Multiple Vitamin (MULTIVITAMIN WITH MINERALS) TABS tablet Take 1 tablet by mouth daily.    . Multiple Vitamins-Minerals (PRESERVISION AREDS 2 PO) Take 1 capsule by mouth 2 (two) times daily.    . niacin 250 MG tablet Take 250 mg by mouth every morning.    . Omega-3 Fatty Acids (FISH OIL) 1000 MG CPDR Take 2,000 capsules by mouth 2 (two) times daily.    . pregabalin (LYRICA) 75 MG capsule Take 75 mg by mouth 2 (two) times daily.    . ranolazine (RANEXA) 500 MG 12 hr tablet  Take 500 mg by mouth 2 (two) times daily.      Home: Home Living Family/patient expects to be discharged to:: Inpatient rehab Living Arrangements: Alone Available Help at Discharge: Family, Available 24 hours/day Type of Home: Apartment Home Access: Elevator Home Layout: One level Bathroom Shower/Tub: Health visitor: Handicapped height Bathroom Accessibility: Yes Home Equipment: Grab bars - toilet, Grab bars - tub/shower, Shower seat - built in, Environmental consultant - 4 wheels, Environmental consultant - 2 wheels  Lives With: Alone  Functional History: Prior Function Level of Independence: Independent with assistive device(s) Comments: Used a rollator all the time Functional Status:  Mobility: Bed Mobility Overal bed mobility: Needs Assistance Bed Mobility: Rolling, Sidelying to Sit Rolling: Min assist Sidelying to sit: Mod assist General bed mobility comments: in chair on arrival Transfers Overall transfer level: Needs assistance Equipment used: Rolling walker (2 wheeled) Transfers: Sit to/from Stand Sit to Stand: Mod assist Stand pivot transfers: Min assist, +2 safety/equipment General transfer comment: cues for hand placement      ADL: ADL Overall ADL's : Needs assistance/impaired Grooming: Wash/dry hands, Minimal assistance, Standing Grooming Details (indicate cue type and reason): requries single UE support at sink Upper Body Dressing : Moderate assistance Lower Body Dressing: Maximal assistance Lower Body Dressing Details (indicate cue type and reason): don mess panties during session Toilet Transfer: Moderate assistance Toileting- Clothing Manipulation and Hygiene: Maximal assistance Functional mobility during ADLs: Minimal assistance General ADL Comments: pt reports urgency to void bladder since admission. pt requesting gramily bring adult briefs ( son present and notified)  Cognition: Cognition Overall Cognitive Status: Impaired/Different from baseline Orientation  Level: Oriented X4 Cognition Arousal/Alertness: Awake/alert Behavior During Therapy: Flat affect Overall Cognitive Status: Impaired/Different from baseline Memory: Decreased short-term memory  Blood pressure 101/60, pulse 92, temperature 97.9 F (36.6 C), temperature source Oral, resp. rate 20, height 5\' 2"  (1.575 m), weight 83 kg (183 lb), SpO2 97 %. Physical Exam  Vitals reviewed. Constitutional: She is oriented to person, place, and time. She appears well-developed and well-nourished.  HENT:  Head: Normocephalic and atraumatic.  Eyes: EOM are normal. Right eye exhibits no discharge. Left eye exhibits no discharge.  Neck: Normal range of motion. Neck supple. No thyromegaly present.  Cardiovascular: Normal rate and regular rhythm.   Respiratory: Effort normal and breath sounds normal.  GI: Soft. Bowel sounds are normal. She exhibits no distension.  Musculoskeletal: She exhibits no edema or tenderness.  Neurological: She is alert and oriented to person, place, and time.  Follows commands.  Fair awareness of deficits. Motor: RUE/RLE: 4+/5 proximal to distal LUE: Apraxia 4-/5 proxim al to distal LLE: HF, KE 3/5, ADF/PF 2-/5 Sensation intact to light touch DTRs symmetric  Skin: Skin is warm and dry.  Psychiatric: She has a normal mood and affect. Her behavior is normal.    Results for orders placed or performed during the hospital encounter of 09/23/16 (from the past 24 hour(s))  Glucose, capillary     Status: Abnormal   Collection Time: 09/25/16 11:45 AM  Result Value Ref Range   Glucose-Capillary 216 (H) 65 - 99 mg/dL   Comment 1 Notify RN    Comment 2 Document in Chart   Hemoglobin A1c     Status: None   Collection Time: 09/25/16 12:59 PM  Result Value Ref Range   Hgb A1c MFr Bld 5.2 4.8 - 5.6 %   Mean Plasma Glucose 103 mg/dL  Glucose, capillary     Status: Abnormal   Collection Time: 09/25/16  4:28 PM  Result Value Ref Range   Glucose-Capillary 177 (H) 65 - 99 mg/dL     Comment 1 Notify RN    Comment 2 Document in Chart   Glucose, capillary     Status: Abnormal   Collection Time: 09/25/16  9:25 PM  Result Value Ref Range   Glucose-Capillary 145 (H) 65 - 99 mg/dL   Comment 1 Notify RN    Comment 2 Document in Chart   Glucose, capillary     Status: Abnormal   Collection Time: 09/26/16  6:40 AM  Result Value Ref Range   Glucose-Capillary 142 (H) 65 - 99 mg/dL   Comment 1 Notify RN    Comment 2 Document in Chart    Ct Angio Head W Or Wo Contrast  Result Date: 09/24/2016 CLINICAL DATA:  82 year old female with left side weakness for 3-4 days and difficulty walking. Evidence of small vessel disease on noncontrast head CT yesterday. Initial encounter. EXAM: CT ANGIOGRAPHY HEAD AND NECK TECHNIQUE: Multidetector CT imaging of the head and neck was performed using the standard protocol during bolus administration of intravenous contrast. Multiplanar CT image reconstructions and MIPs were obtained to evaluate the vascular anatomy. Carotid stenosis measurements (when applicable) are obtained utilizing NASCET criteria, using the distal internal carotid diameter as the denominator. CONTRAST:  50 mL Isovue 370 COMPARISON:  [facility] noncontrast head CT 09/23/2016 FINDINGS: CTA NECK Skeleton: Multilevel cervical spine degeneration. Osteopenia. Severe chronic degenerative osseous changes about both shoulders. No acute osseous abnormality identified. Visualized paranasal sinuses and mastoids are stable and well pneumatized. Upper chest: Negative lung apices ; mild dependent atelectasis and mosaic attenuation. No superior mediastinal lymphadenopathy. Left chest cardiac pacemaker type device partially visible. Other neck: Negative thyroid with sub cm right thyroid lobe nodules which do not meet consensus criteria for ultrasound follow-up. Larynx and pharynx soft tissue contours are within normal limits. Negative parapharyngeal and retropharyngeal spaces. Negative  sublingual space, submandibular glands, and parotid glands. No cervical lymphadenopathy. Aortic arch: 4 vessel arch configuration, the left vertebral artery arises directly from the arch. Moderate calcified arch atherosclerosis. Right carotid system: No brachiocephalic artery stenosis despite calcified plaque. Tortuous right CCA origin with highly kinked appearance as seen on series 8, image 129. Otherwise no proximal right carotid stenosis. Calcified plaque at the right carotid bifurcation and right ICA bulb with less than 50 % stenosis with respect to the distal vessel. Mildly tortuous cervical right ICA. Left carotid system: Normal left CCA origin. Tortuous proximal left CCA with mild calcified plaque but no stenosis. Intermittent soft and calcified plaque in the distal left CCA and at the left carotid bifurcation with no stenosis. Tortuous cervical left ICA at the C1 level. Minimal distal cervical left ICA calcified plaque. Vertebral arteries: Tortuous proximal right subclavian artery with calcified plaque but no significant stenosis. Calcified plaque near the right vertebral artery origin without significant stenosis (series 8, image 142). Right  V1 segment calcified plaque without stenosis. Tortuous right V2 segment with no vertebral artery stenosis to the skullbase. The left vertebral artery arises directly from the arch with no proximal stenosis. The proximal left vertebral is tortuous. The left vertebral is mildly non dominant. The left V2 segment is tortuous. No stenosis to the skullbase. CTA HEAD Posterior circulation: Calcified plaque in the right V4 segment with mild to moderate stenosis proximal to the right PICA origin which remains patent. The more distal right vertebral artery is normal to the vertebrobasilar junction. No distal left vertebral artery stenosis. The left PICA origin is patent. The basilar artery is patent with mild irregularity and no stenosis. Patent AICA and SCA origins. Fetal type  bilateral PCA origins. Mild irregularity and stenosis in the left PCA P2 segment (series 12, image 25). Moderate to severe irregularity and stenosis in the left P3 superior division (same image). Multifocal severe irregularity and stenosis in the distal right P2 segment (series 12, image 18) and proximal right P3 branches (same image). Anterior circulation: Both ICA siphons are patent. Moderate siphon calcified plaque greater on the right. Mild left supraclinoid and mild to moderate right cavernous segment ICA stenoses. Normal ophthalmic and posterior communicating artery origins. Patent carotid termini. Normal MCA and left ACA origins. Mild irregularity and stenosis at the right ACA origin. Anterior communicating artery is within normal limits. Widespread high-grade stenosis throughout the right ACA A2 segment. Moderate to severe distal left A2 segment stenosis. See series 12, image 22). Preserved distal ACA enhancement bilaterally. Mildly irregular left MCA M1 segment. Moderate to severe irregularity and stenosis at the left MCA bifurcation affecting the anterior division more so, but also with high-grade proximal posterior M2 stenosis but preserved distal flow. See series 12, image 31. Right MCA M1 segment appears normal. At the right MCA bifurcation there is moderate irregularity and stenosis. There is up to moderate scattered irregularity and stenosis in the right MCA M2 and distal branches. Venous sinuses: Patent. Anatomic variants: Left vertebral artery arises directly from the arch. Bilateral fetal type PCA origins. Delayed phase: Curvilinear enhancement about the heterogeneity in the right caudate nucleus. No associated mass effect. No acute intracranial hemorrhage identified. No other abnormal intracranial enhancement. Review of the MIP images confirms the above findings IMPRESSION: 1. Negative for emergent large vessel occlusion. 2. Mild enhancement without mass effect about the right basal ganglia  heterogeneity seen yesterday, suggesting acute to subacute small vessel ischemia there. Otherwise stable CT appearance of the brain. 3. Extensive intracranial atherosclerosis including moderate or severe stenoses: 4. - Right PCA P2 and distal segments. - Right greater than Left ACA A2 segments. - Left greater than Right MCA bifurcations, M2 and distal MCA branches. 5. Mild-to-moderate stenoses of the bilateral ICA siphons, distal right vertebral artery, and left PCA. 6. Mild for age extracranial atherosclerosis, with no hemodynamically significant stenosis in the neck aside from tortuous proximal right CCA with a kinked appearance. Electronically Signed   By: Odessa FlemingH  Hall M.D.   On: 09/24/2016 12:32   Ct Angio Neck W Or Wo Contrast  Result Date: 09/24/2016 CLINICAL DATA:  82 year old female with left side weakness for 3-4 days and difficulty walking. Evidence of small vessel disease on noncontrast head CT yesterday. Initial encounter. EXAM: CT ANGIOGRAPHY HEAD AND NECK TECHNIQUE: Multidetector CT imaging of the head and neck was performed using the standard protocol during bolus administration of intravenous contrast. Multiplanar CT image reconstructions and MIPs were obtained to evaluate the vascular anatomy. Carotid stenosis measurements (  when applicable) are obtained utilizing NASCET criteria, using the distal internal carotid diameter as the denominator. CONTRAST:  50 mL Isovue 370 [facility] 09/23/2016 FINDINGS: CTA NECK Skeleton: Multilevel cervical spine degeneration. Osteopenia. Severe chronic degenerative osseous changes about both shoulders. No acute osseous abnormality identified. Visualized paranasal sinuses and mastoids are stable and well pneumatized. Upper chest: Negative lung apices ; mild dependent atelectasis and mosaic attenuation. No superior mediastinal lymphadenopathy. Left chest cardiac pacemaker type device partially visible. Other neck: Negative  thyroid with sub cm right thyroid lobe nodules which do not meet consensus criteria for ultrasound follow-up. Larynx and pharynx soft tissue contours are within normal limits. Negative parapharyngeal and retropharyngeal spaces. Negative sublingual space, submandibular glands, and parotid glands. No cervical lymphadenopathy. Aortic arch: 4 vessel arch configuration, the left vertebral artery arises directly from the arch. Moderate calcified arch atherosclerosis. Right carotid system: No brachiocephalic artery stenosis despite calcified plaque. Tortuous right CCA origin with highly kinked appearance as seen on series 8, image 129. Otherwise no proximal right carotid stenosis. Calcified plaque at the right carotid bifurcation and right ICA bulb with less than 50 % stenosis with respect to the distal vessel. Mildly tortuous cervical right ICA. Left carotid system: Normal left CCA origin. Tortuous proximal left CCA with mild calcified plaque but no stenosis. Intermittent soft and calcified plaque in the distal left CCA and at the left carotid bifurcation with no stenosis. Tortuous cervical left ICA at the C1 level. Minimal distal cervical left ICA calcified plaque. Vertebral arteries: Tortuous proximal right subclavian artery with calcified plaque but no significant stenosis. Calcified plaque near the right vertebral artery origin without significant stenosis (series 8, image 142). Right V1 segment calcified plaque without stenosis. Tortuous right V2 segment with no vertebral artery stenosis to the skullbase. The left vertebral artery arises directly from the arch with no proximal stenosis. The proximal left vertebral is tortuous. The left vertebral is mildly non dominant. The left V2 segment is tortuous. No stenosis to the skullbase. CTA HEAD Posterior circulation: Calcified plaque in the right V4 segment with mild to moderate stenosis proximal to the right PICA origin which remains patent. The more distal right  vertebral artery is normal to the vertebrobasilar junction. No distal left vertebral artery stenosis. The left PICA origin is patent. The basilar artery is patent with mild irregularity and no stenosis. Patent AICA and SCA origins. Fetal type bilateral PCA origins. Mild irregularity and stenosis in the left PCA P2 segment (series 12, image 25). Moderate to severe irregularity and stenosis in the left P3 superior division (same image). Multifocal severe irregularity and stenosis in the distal right P2 segment (series 12, image 18) and proximal right P3 branches (same image). Anterior circulation: Both ICA siphons are patent. Moderate siphon calcified plaque greater on the right. Mild left supraclinoid and mild to moderate right cavernous segment ICA stenoses. Normal ophthalmic and posterior communicating artery origins. Patent carotid termini. Normal MCA and left ACA origins. Mild irregularity and stenosis at the right ACA origin. Anterior communicating artery is within normal limits. Widespread high-grade stenosis throughout the right ACA A2 segment. Moderate to severe distal left A2 segment stenosis. See series 12, image 22). Preserved distal ACA enhancement bilaterally. Mildly irregular left MCA M1 segment. Moderate to severe irregularity and stenosis at the left MCA bifurcation affecting the anterior division more so, but also with high-grade proximal posterior M2 stenosis but preserved distal flow. See series 12, image 31. Right MCA M1 segment  appears normal. At the right MCA bifurcation there is moderate irregularity and stenosis. There is up to moderate scattered irregularity and stenosis in the right MCA M2 and distal branches. Venous sinuses: Patent. Anatomic variants: Left vertebral artery arises directly from the arch. Bilateral fetal type PCA origins. Delayed phase: Curvilinear enhancement about the heterogeneity in the right caudate nucleus. No associated mass effect. No acute intracranial hemorrhage  identified. No other abnormal intracranial enhancement. Review of the MIP images confirms the above findings IMPRESSION: 1. Negative for emergent large vessel occlusion. 2. Mild enhancement without mass effect about the right basal ganglia heterogeneity seen yesterday, suggesting acute to subacute small vessel ischemia there. Otherwise stable CT appearance of the brain. 3. Extensive intracranial atherosclerosis including moderate or severe stenoses: 4. - Right PCA P2 and distal segments. - Right greater than Left ACA A2 segments. - Left greater than Right MCA bifurcations, M2 and distal MCA branches. 5. Mild-to-moderate stenoses of the bilateral ICA siphons, distal right vertebral artery, and left PCA. 6. Mild for age extracranial atherosclerosis, with no hemodynamically significant stenosis in the neck aside from tortuous proximal right CCA with a kinked appearance. Electronically Signed   By: Odessa Fleming M.D.   On: 09/24/2016 12:32    Assessment/Plan: Diagnosis: Right basal ganglia infarct secondary to small vessel disease  1. Does the need for close, 24 hr/day medical supervision in concert with the patient's rehab needs make it unreasonable for this patient to be served in a less intensive setting? Yes 2. Co-Morbidities requiring supervision/potential complications: diabetes mellitus, hypertension,SSS 3. Due to bladder management, safety, skin/wound care, disease management, medication administration and patient education, does the patient require 24 hr/day rehab nursing? Yes 4. Does the patient require coordinated care of a physician, rehab nurse, PT (1-2 hrs/day, 5 days/week) and OT (1-2 hrs/day, 5 days/week) to address physical and functional deficits in the context of the above medical diagnosis(es)? Yes Addressing deficits in the following areas: balance, endurance, locomotion, strength, transferring, bowel/bladder control, bathing, dressing, toileting and psychosocial support 5. Can the patient  actively participate in an intensive therapy program of at least 3 hrs of therapy per day at least 5 days per week? Yes 6. The potential for patient to make measurable gains while on inpatient rehab is excellent 7. Anticipated functional outcomes upon discharge from inpatient rehab are modified independent and supervision  with PT, modified independent with OT, n/a with SLP. 8. Estimated rehab length of stay to reach the above functional goals is: 15-19 days. 9. Does the patient have adequate social supports and living environment to accommodate these discharge functional goals? Potentially 10. Anticipated D/C setting: Home 11. Anticipated post D/C treatments: HH therapy and Home excercise program 12. Overall Rehab/Functional Prognosis: good  RECOMMENDATIONS: This patient's condition is appropriate for continued rehabilitative care in the following setting: CIR Patient has agreed to participate in recommended program. Yes Note that insurance prior authorization may be required for reimbursement for recommended care.  Comment: Rehab Admissions Coordinator to follow up.  Maryla Morrow, MD, [place] [facility] 09/26/2016

## 2016-09-26 NOTE — Progress Notes (Signed)
Patient arrived, oriented to unit. No pain. Discussed plan with family members.

## 2016-09-26 NOTE — Discharge Summary (Signed)
Physician Discharge Summary  Charlene Greene ZOX:[id number] DOB: October 20, 1922 DOA: 09/23/2016  PCP: No primary care provider on file.  Admit date: 09/23/2016 Discharge date: 09/26/2016  Time spent: 45 minutes  Recommendations for Outpatient Follow-up:  1. Started on Plavix and Zetia  Discharge Diagnoses:  Principal Problem:   CVA (cerebral vascular accident) (HCC) Active Problems:   Accelerated hypertension   Diabetes mellitus (HCC)   Hyperlipidemia   Sick sinus syndrome (HCC)   Neuropathic pain   Benign essential HTN   Slow transit constipation   Elevated creatine kinase   Discharge Condition: stable  Diet recommendation: diabetic, heart healthy  Filed Weights   09/23/16 1746  Weight: 83 kg (183 lb)    History of present illness:  Patient is a 82 year old female with history of hypertension, hyperlipidemia, sick sinus syndrome status post pacemaker, prior CAD, diabetes mellitus who presented to ED with left-sided weakness for past 3-4 days and slurred speech, facial drooping. CT head showed abnormal appearance of right basal ganglia region combination of old an acute infarction. Patient was transferred to [facility] for full stroke workup  Hospital Course:   Acute CVA -Non-dominant right basal ganglia infarct- acute plus chronic components secondary to small vessel disease  -with residual L sided weakness -unable to have MRI due to pacemaker -MRI  / MRA  pacer -CTA head and neck no large vessel occlusion. Extensive intracranial atherosclerosis.   -2D Echo  LVH, normal EF, unremarkable.  -LDL unable to calculate due to high TG, HgbA1c  7.7, started Zetia -changed ASA to Plavix -PT/OT eval completed and CIR consulted, discharged to CIR for rehab -FU with Dr.Sethi in 80month  Essential Hypertension -resumed home meds, cut down clonidine dose for 3-4days then can be resumed at full home dose  Hyperlipidemia -LDL unable to calculate, Unable to tolerate statins  (myalgia) -started on Zetia , please monitor for side effects  Diabetes type II -stable, HgbA1c 7.7   CAD -stable  SSS -s/p pacer   Consultations:  Neurology  Discharge Exam: Vitals:   09/26/16 0900 09/26/16 1017  BP: (!) 159/66 101/60  Pulse: 89 92  Resp:  20  Temp:  97.9 F (36.6 C)    General: AAOx3 Cardiovascular: S1S2/RRR Respiratory: CTAB  Discharge Instructions    Current Discharge Medication List    START taking these medications   Details  clopidogrel (PLAVIX) 75 MG tablet Take 1 tablet (75 mg total) by mouth daily.    ezetimibe (ZETIA) 10 MG tablet Take 1 tablet (10 mg total) by mouth daily.      CONTINUE these medications which have CHANGED   Details  cloNIDine (CATAPRES) 0.2 MG tablet Take 0.5 tablets (0.1 mg total) by mouth 2 (two) times daily. Can increase to 0.2mg  BID in 3-4days      CONTINUE these medications which have NOT CHANGED   Details  amLODipine (NORVASC) 5 MG tablet Take 5 mg by mouth 2 (two) times daily.    BL MAGNESIUM PO Take 1 tablet by mouth daily.    busPIRone (BUSPAR) 5 MG tablet Take 5 mg by mouth 2 (two) times daily.    calcium carbonate (OSCAL) 1500 (600 Ca) MG TABS tablet Take 600 mg of elemental calcium by mouth 2 (two) times daily with a meal.    fenofibrate 160 MG tablet Take 160 mg by mouth daily.    glimepiride (AMARYL) 2 MG tablet Take 2-3 mg by mouth 2 (two) times daily. 1 tab in the am and 1/2 hs  isosorbide mononitrate (IMDUR) 60 MG 24 hr tablet Take 60 mg by mouth daily.    lisinopril (PRINIVIL,ZESTRIL) 40 MG tablet Take 40 mg by mouth 2 (two) times daily.    Melatonin 3 MG TABS Take 3 mg by mouth at bedtime.    metoprolol succinate (TOPROL-XL) 100 MG 24 hr tablet Take 100 mg by mouth daily.     Multiple Vitamin (MULTIVITAMIN WITH MINERALS) TABS tablet Take 1 tablet by mouth daily.    niacin 250 MG tablet Take 250 mg by mouth every morning.    Omega-3 Fatty Acids (FISH OIL) 1000 MG CPDR Take  2,000 capsules by mouth 2 (two) times daily.    pregabalin (LYRICA) 75 MG capsule Take 75 mg by mouth 2 (two) times daily.    ranolazine (RANEXA) 500 MG 12 hr tablet Take 500 mg by mouth 2 (two) times daily.      STOP taking these medications     aspirin 325 MG EC tablet      Multiple Vitamins-Minerals (PRESERVISION AREDS 2 PO)        Allergies  Allergen Reactions  . Statins     myalgia      The results of significant diagnostics from this hospitalization (including imaging, microbiology, ancillary and laboratory) are listed below for reference.    Significant Diagnostic Studies: Ct Angio Head W Or Wo Contrast  Result Date: 09/24/2016 CLINICAL DATA:  82 year old female with left side weakness for 3-4 days and difficulty walking. Evidence of small vessel disease on noncontrast head CT yesterday. Initial encounter. EXAM: CT ANGIOGRAPHY HEAD AND NECK TECHNIQUE: Multidetector CT imaging of the head and neck was performed using the standard protocol during bolus administration of intravenous contrast. Multiplanar CT image reconstructions and MIPs were obtained to evaluate the vascular anatomy. Carotid stenosis measurements (when applicable) are obtained utilizing NASCET criteria, using the distal internal carotid diameter as the denominator. CONTRAST:  50 mL Isovue 370 [facility] 09/23/2016 FINDINGS: CTA NECK Skeleton: Multilevel cervical spine degeneration. Osteopenia. Severe chronic degenerative osseous changes about both shoulders. No acute osseous abnormality identified. Visualized paranasal sinuses and mastoids are stable and well pneumatized. Upper chest: Negative lung apices ; mild dependent atelectasis and mosaic attenuation. No superior mediastinal lymphadenopathy. Left chest cardiac pacemaker type device partially visible. Other neck: Negative thyroid with sub cm right thyroid lobe nodules which do not meet consensus criteria for ultrasound  follow-up. Larynx and pharynx soft tissue contours are within normal limits. Negative parapharyngeal and retropharyngeal spaces. Negative sublingual space, submandibular glands, and parotid glands. No cervical lymphadenopathy. Aortic arch: 4 vessel arch configuration, the left vertebral artery arises directly from the arch. Moderate calcified arch atherosclerosis. Right carotid system: No brachiocephalic artery stenosis despite calcified plaque. Tortuous right CCA origin with highly kinked appearance as seen on series 8, image 129. Otherwise no proximal right carotid stenosis. Calcified plaque at the right carotid bifurcation and right ICA bulb with less than 50 % stenosis with respect to the distal vessel. Mildly tortuous cervical right ICA. Left carotid system: Normal left CCA origin. Tortuous proximal left CCA with mild calcified plaque but no stenosis. Intermittent soft and calcified plaque in the distal left CCA and at the left carotid bifurcation with no stenosis. Tortuous cervical left ICA at the C1 level. Minimal distal cervical left ICA calcified plaque. Vertebral arteries: Tortuous proximal right subclavian artery with calcified plaque but no significant stenosis. Calcified plaque near the right vertebral artery origin without significant stenosis (series 8,  image 142). Right V1 segment calcified plaque without stenosis. Tortuous right V2 segment with no vertebral artery stenosis to the skullbase. The left vertebral artery arises directly from the arch with no proximal stenosis. The proximal left vertebral is tortuous. The left vertebral is mildly non dominant. The left V2 segment is tortuous. No stenosis to the skullbase. CTA HEAD Posterior circulation: Calcified plaque in the right V4 segment with mild to moderate stenosis proximal to the right PICA origin which remains patent. The more distal right vertebral artery is normal to the vertebrobasilar junction. No distal left vertebral artery stenosis. The  left PICA origin is patent. The basilar artery is patent with mild irregularity and no stenosis. Patent AICA and SCA origins. Fetal type bilateral PCA origins. Mild irregularity and stenosis in the left PCA P2 segment (series 12, image 25). Moderate to severe irregularity and stenosis in the left P3 superior division (same image). Multifocal severe irregularity and stenosis in the distal right P2 segment (series 12, image 18) and proximal right P3 branches (same image). Anterior circulation: Both ICA siphons are patent. Moderate siphon calcified plaque greater on the right. Mild left supraclinoid and mild to moderate right cavernous segment ICA stenoses. Normal ophthalmic and posterior communicating artery origins. Patent carotid termini. Normal MCA and left ACA origins. Mild irregularity and stenosis at the right ACA origin. Anterior communicating artery is within normal limits. Widespread high-grade stenosis throughout the right ACA A2 segment. Moderate to severe distal left A2 segment stenosis. See series 12, image 22). Preserved distal ACA enhancement bilaterally. Mildly irregular left MCA M1 segment. Moderate to severe irregularity and stenosis at the left MCA bifurcation affecting the anterior division more so, but also with high-grade proximal posterior M2 stenosis but preserved distal flow. See series 12, image 31. Right MCA M1 segment appears normal. At the right MCA bifurcation there is moderate irregularity and stenosis. There is up to moderate scattered irregularity and stenosis in the right MCA M2 and distal branches. Venous sinuses: Patent. Anatomic variants: Left vertebral artery arises directly from the arch. Bilateral fetal type PCA origins. Delayed phase: Curvilinear enhancement about the heterogeneity in the right caudate nucleus. No associated mass effect. No acute intracranial hemorrhage identified. No other abnormal intracranial enhancement. Review of the MIP images confirms the above findings  IMPRESSION: 1. Negative for emergent large vessel occlusion. 2. Mild enhancement without mass effect about the right basal ganglia heterogeneity seen yesterday, suggesting acute to subacute small vessel ischemia there. Otherwise stable CT appearance of the brain. 3. Extensive intracranial atherosclerosis including moderate or severe stenoses: 4. - Right PCA P2 and distal segments. - Right greater than Left ACA A2 segments. - Left greater than Right MCA bifurcations, M2 and distal MCA branches. 5. Mild-to-moderate stenoses of the bilateral ICA siphons, distal right vertebral artery, and left PCA. 6. Mild for age extracranial atherosclerosis, with no hemodynamically significant stenosis in the neck aside from tortuous proximal right CCA with a kinked appearance. Electronically Signed   By: Odessa FlemingH  Hall M.D.   On: 09/24/2016 12:32   Ct Head Wo Contrast  Result Date: 09/23/2016 CLINICAL DATA:  Mental status deterioration over the last week. EXAM: CT HEAD WITHOUT CONTRAST TECHNIQUE: Contiguous axial images were obtained from the base of the skull through the vertex without intravenous contrast. COMPARISON:  None. FINDINGS: Brain: The brain shows generalized atrophy. There are moderate chronic appearing small vessel ischemic changes affecting the cerebral hemispheric white matter. There is an abnormal pattern in the right basal gangliar region which  could relate to old and/or recent infarction. However, some characteristics suggest the possibility of vasogenic edema. Therefore, MRI would be suggested if the patient is a candidate. No evidence of hydrocephalus or extra-axial collection. Vascular: There is atherosclerotic calcification of the major vessels at the base of the brain. Skull: Negative Sinuses/Orbits: Clear/normal Other: None significant IMPRESSION: Atrophy an extensive chronic small vessel ischemic changes. Abnormal appearance of the right basal gangliar region that could represent a combination of old and  acute infarction. However, the pattern is somewhat unusual, raising the possibility of vasogenic edema. MRI would be suggested if the patient is a candidate. Electronically Signed   By: Paulina Fusi M.D.   On: 09/23/2016 12:51   Ct Angio Neck W Or Wo Contrast  Result Date: 09/24/2016 CLINICAL DATA:  82 year old female with left side weakness for 3-4 days and difficulty walking. Evidence of small vessel disease on noncontrast head CT yesterday. Initial encounter. EXAM: CT ANGIOGRAPHY HEAD AND NECK TECHNIQUE: Multidetector CT imaging of the head and neck was performed using the standard protocol during bolus administration of intravenous contrast. Multiplanar CT image reconstructions and MIPs were obtained to evaluate the vascular anatomy. Carotid stenosis measurements (when applicable) are obtained utilizing NASCET criteria, using the distal internal carotid diameter as the denominator. CONTRAST:  50 mL Isovue 370 COMPARISON:  [facility] noncontrast head CT 09/23/2016 FINDINGS: CTA NECK Skeleton: Multilevel cervical spine degeneration. Osteopenia. Severe chronic degenerative osseous changes about both shoulders. No acute osseous abnormality identified. Visualized paranasal sinuses and mastoids are stable and well pneumatized. Upper chest: Negative lung apices ; mild dependent atelectasis and mosaic attenuation. No superior mediastinal lymphadenopathy. Left chest cardiac pacemaker type device partially visible. Other neck: Negative thyroid with sub cm right thyroid lobe nodules which do not meet consensus criteria for ultrasound follow-up. Larynx and pharynx soft tissue contours are within normal limits. Negative parapharyngeal and retropharyngeal spaces. Negative sublingual space, submandibular glands, and parotid glands. No cervical lymphadenopathy. Aortic arch: 4 vessel arch configuration, the left vertebral artery arises directly from the arch. Moderate calcified arch atherosclerosis. Right carotid  system: No brachiocephalic artery stenosis despite calcified plaque. Tortuous right CCA origin with highly kinked appearance as seen on series 8, image 129. Otherwise no proximal right carotid stenosis. Calcified plaque at the right carotid bifurcation and right ICA bulb with less than 50 % stenosis with respect to the distal vessel. Mildly tortuous cervical right ICA. Left carotid system: Normal left CCA origin. Tortuous proximal left CCA with mild calcified plaque but no stenosis. Intermittent soft and calcified plaque in the distal left CCA and at the left carotid bifurcation with no stenosis. Tortuous cervical left ICA at the C1 level. Minimal distal cervical left ICA calcified plaque. Vertebral arteries: Tortuous proximal right subclavian artery with calcified plaque but no significant stenosis. Calcified plaque near the right vertebral artery origin without significant stenosis (series 8, image 142). Right V1 segment calcified plaque without stenosis. Tortuous right V2 segment with no vertebral artery stenosis to the skullbase. The left vertebral artery arises directly from the arch with no proximal stenosis. The proximal left vertebral is tortuous. The left vertebral is mildly non dominant. The left V2 segment is tortuous. No stenosis to the skullbase. CTA HEAD Posterior circulation: Calcified plaque in the right V4 segment with mild to moderate stenosis proximal to the right PICA origin which remains patent. The more distal right vertebral artery is normal to the vertebrobasilar junction. No distal left vertebral artery stenosis. The left PICA origin is  patent. The basilar artery is patent with mild irregularity and no stenosis. Patent AICA and SCA origins. Fetal type bilateral PCA origins. Mild irregularity and stenosis in the left PCA P2 segment (series 12, image 25). Moderate to severe irregularity and stenosis in the left P3 superior division (same image). Multifocal severe irregularity and stenosis in  the distal right P2 segment (series 12, image 18) and proximal right P3 branches (same image). Anterior circulation: Both ICA siphons are patent. Moderate siphon calcified plaque greater on the right. Mild left supraclinoid and mild to moderate right cavernous segment ICA stenoses. Normal ophthalmic and posterior communicating artery origins. Patent carotid termini. Normal MCA and left ACA origins. Mild irregularity and stenosis at the right ACA origin. Anterior communicating artery is within normal limits. Widespread high-grade stenosis throughout the right ACA A2 segment. Moderate to severe distal left A2 segment stenosis. See series 12, image 22). Preserved distal ACA enhancement bilaterally. Mildly irregular left MCA M1 segment. Moderate to severe irregularity and stenosis at the left MCA bifurcation affecting the anterior division more so, but also with high-grade proximal posterior M2 stenosis but preserved distal flow. See series 12, image 31. Right MCA M1 segment appears normal. At the right MCA bifurcation there is moderate irregularity and stenosis. There is up to moderate scattered irregularity and stenosis in the right MCA M2 and distal branches. Venous sinuses: Patent. Anatomic variants: Left vertebral artery arises directly from the arch. Bilateral fetal type PCA origins. Delayed phase: Curvilinear enhancement about the heterogeneity in the right caudate nucleus. No associated mass effect. No acute intracranial hemorrhage identified. No other abnormal intracranial enhancement. Review of the MIP images confirms the above findings IMPRESSION: 1. Negative for emergent large vessel occlusion. 2. Mild enhancement without mass effect about the right basal ganglia heterogeneity seen yesterday, suggesting acute to subacute small vessel ischemia there. Otherwise stable CT appearance of the brain. 3. Extensive intracranial atherosclerosis including moderate or severe stenoses: 4. - Right PCA P2 and distal  segments. - Right greater than Left ACA A2 segments. - Left greater than Right MCA bifurcations, M2 and distal MCA branches. 5. Mild-to-moderate stenoses of the bilateral ICA siphons, distal right vertebral artery, and left PCA. 6. Mild for age extracranial atherosclerosis, with no hemodynamically significant stenosis in the neck aside from tortuous proximal right CCA with a kinked appearance. Electronically Signed   By: Odessa Fleming M.D.   On: 09/24/2016 12:32   Dg Chest Port 1 View  Result Date: 09/23/2016 CLINICAL DATA:  Slurred speech and confusion. EXAM: PORTABLE CHEST 1 VIEW COMPARISON:  None. FINDINGS: No pneumothorax. Cardiomegaly. The hila and mediastinum are unremarkable. No pulmonary nodules or masses. Mild left basilar atelectasis. No suspicious infiltrate. Chronic changes in the shoulders, left worse than right. IMPRESSION: No active disease. Electronically Signed   By: Gerome Sam III M.D   On: 09/23/2016 12:55    Microbiology: Recent Results (from the past 240 hour(s))  Urine culture     Status: None   Collection Time: 09/23/16  1:14 PM  Result Value Ref Range Status   Specimen Description URINE, CLEAN CATCH  Final   Special Requests NONE  Final   Culture NO GROWTH Performed at [facility]   Final   Report Status 09/24/2016 FINAL  Final     Labs: Basic Metabolic Panel:  Recent Labs Lab 09/23/16 1136 09/23/16 1650 09/24/16 0749  NA 138  --  142  K 4.5  --  3.9  CL 100*  --  103  CO2 29  --  26  GLUCOSE 262*  --  138*  BUN 22*  --  16  CREATININE 1.21* 1.12* 1.07*  CALCIUM 9.1  --  9.1   Liver Function Tests:  Recent Labs Lab 09/23/16 1136  AST 35  ALT 28  ALKPHOS 41  BILITOT 0.7  PROT 6.5  ALBUMIN 4.0   No results for input(s): LIPASE, AMYLASE in the last 168 hours. No results for input(s): AMMONIA in the last 168 hours. CBC:  Recent Labs Lab 09/23/16 1136 09/23/16 1650  WBC 9.6 7.2  HGB 13.8 14.2  HCT 40.5 43.1  MCV 86.4 87.6  PLT  248 242   Cardiac Enzymes:  Recent Labs Lab 09/23/16 1136  TROPONINI <0.03   BNP: BNP (last 3 results) No results for input(s): BNP in the last 8760 hours.  ProBNP (last 3 results) No results for input(s): PROBNP in the last 8760 hours.  CBG:  Recent Labs Lab 09/25/16 1145 09/25/16 1628 09/25/16 2125 09/26/16 0640 09/26/16 1148  GLUCAP 216* 177* 145* 142* 223*       SignedZannie Cove MD.  [facility] 09/26/2016, 4:31 PM

## 2016-09-27 ENCOUNTER — Inpatient Hospital Stay (HOSPITAL_COMMUNITY): Payer: Medicare Other | Admitting: Occupational Therapy

## 2016-09-27 ENCOUNTER — Inpatient Hospital Stay (HOSPITAL_COMMUNITY): Payer: Medicare Other | Admitting: Physical Therapy

## 2016-09-27 DIAGNOSIS — I10 Essential (primary) hypertension: Secondary | ICD-10-CM

## 2016-09-27 DIAGNOSIS — I639 Cerebral infarction, unspecified: Secondary | ICD-10-CM

## 2016-09-27 DIAGNOSIS — N289 Disorder of kidney and ureter, unspecified: Secondary | ICD-10-CM

## 2016-09-27 DIAGNOSIS — R3915 Urgency of urination: Secondary | ICD-10-CM

## 2016-09-27 LAB — CBC WITH DIFFERENTIAL/PLATELET
BASOS ABS: 0 10*3/uL (ref 0.0–0.1)
Basophils Relative: 0 %
EOS PCT: 3 %
Eosinophils Absolute: 0.2 10*3/uL (ref 0.0–0.7)
HCT: 43.4 % (ref 36.0–46.0)
Hemoglobin: 14.4 g/dL (ref 12.0–15.0)
LYMPHS ABS: 1.5 10*3/uL (ref 0.7–4.0)
LYMPHS PCT: 21 %
MCH: 29.1 pg (ref 26.0–34.0)
MCHC: 33.2 g/dL (ref 30.0–36.0)
MCV: 87.9 fL (ref 78.0–100.0)
MONO ABS: 0.6 10*3/uL (ref 0.1–1.0)
MONOS PCT: 8 %
Neutro Abs: 5 10*3/uL (ref 1.7–7.7)
Neutrophils Relative %: 68 %
PLATELETS: 208 10*3/uL (ref 150–400)
RBC: 4.94 MIL/uL (ref 3.87–5.11)
RDW: 14.1 % (ref 11.5–15.5)
WBC: 7.2 10*3/uL (ref 4.0–10.5)

## 2016-09-27 LAB — COMPREHENSIVE METABOLIC PANEL
ALT: 20 U/L (ref 14–54)
ANION GAP: 8 (ref 5–15)
AST: 26 U/L (ref 15–41)
Albumin: 3.3 g/dL — ABNORMAL LOW (ref 3.5–5.0)
Alkaline Phosphatase: 41 U/L (ref 38–126)
BUN: 18 mg/dL (ref 6–20)
CHLORIDE: 100 mmol/L — AB (ref 101–111)
CO2: 32 mmol/L (ref 22–32)
Calcium: 9.2 mg/dL (ref 8.9–10.3)
Creatinine, Ser: 1.23 mg/dL — ABNORMAL HIGH (ref 0.44–1.00)
GFR, EST AFRICAN AMERICAN: 43 mL/min — AB (ref >60)
GFR, EST NON AFRICAN AMERICAN: 37 mL/min — AB (ref >60)
Glucose, Bld: 171 mg/dL — ABNORMAL HIGH (ref 65–99)
POTASSIUM: 4.3 mmol/L (ref 3.5–5.1)
Sodium: 140 mmol/L (ref 135–145)
TOTAL PROTEIN: 6.1 g/dL — AB (ref 6.5–8.1)
Total Bilirubin: 0.6 mg/dL (ref 0.3–1.2)

## 2016-09-27 LAB — URINE MICROSCOPIC-ADD ON

## 2016-09-27 LAB — URINALYSIS, ROUTINE W REFLEX MICROSCOPIC
Glucose, UA: 100 mg/dL — AB
KETONES UR: NEGATIVE mg/dL
Nitrite: NEGATIVE
PROTEIN: 100 mg/dL — AB
Specific Gravity, Urine: 1.019 (ref 1.005–1.030)
pH: 8.5 — ABNORMAL HIGH (ref 5.0–8.0)

## 2016-09-27 LAB — GLUCOSE, CAPILLARY
GLUCOSE-CAPILLARY: 148 mg/dL — AB (ref 65–99)
GLUCOSE-CAPILLARY: 214 mg/dL — AB (ref 65–99)
GLUCOSE-CAPILLARY: 207 mg/dL — AB (ref 65–99)
Glucose-Capillary: 194 mg/dL — ABNORMAL HIGH (ref 65–99)

## 2016-09-27 NOTE — Progress Notes (Signed)
Patient information reviewed and entered into e[facility] system by Tora DuckMarie Seidy Labreck, RN, CRRN, PPS Coordinator.  Information including medical coding and functional independence measure will be reviewed and updated through discharge.     Per nursing patient was given "Data Collection Information Summary for Patients in Inpatient Rehabilitation Facilities with attached "Privacy Act Statement-Health Care Records" upon admission.  Present in Health Resource Notebook in patient's room.

## 2016-09-27 NOTE — Progress Notes (Signed)
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 right handed female with history of diabetes mellitus, hypertension,SSS with pacemaker maintain on aspirin 325 mg daily. Patient is a resident at [facility] where she has been for the past 2 and half years. Presented 09/23/2016 with left-sided weakness 3-4 days and difficulty naming objects. At baseline patient was independent with assistive device prior to admission. CT of the head showed abnormal appearance of the right basal ganglia region that could represent a combination of old an acute infarct. Patient did not receive TPA. CT angiogram of head and neck with no emergent large vessel occlusion. MRI not completed secondary to pacemaker. Echocardiogram with ejection fraction of 70% grade 1 diastolic function no regional wall motion abnormalities. Presently maintained on Plavix for CVA prophylaxis. Subcutaneous Lovenox for DVT prophylaxis. Tolerating a regular diet. Physical and occupational therapy evaluations completed with recommendations of physical medicine rehabilitation consult.   Review of Systems  HENT: Negative for hearing loss and tinnitus.   Eyes: Negative for blurred vision and double vision.  Respiratory: Negative for cough and shortness of breath.   Cardiovascular: Positive for palpitations. Negative for chest pain and leg swelling.  Gastrointestinal: Positive for constipation. Negative for nausea and vomiting.  Genitourinary:  Negative for dysuria and hematuria.  Musculoskeletal: Positive for joint pain and myalgias.  Skin: Negative for rash.  Neurological: Positive for speech change, focal weakness and weakness.  All other systems reviewed and are negative.      Past Medical History:  Diagnosis Date  . Diabetes mellitus without complication (HCC)   . Hypertension   . Pacemaker         Past Surgical History:  Procedure Laterality Date  . PACEMAKER PLACEMENT     History reviewed. No pertinent family history. of stroke at young age. Social History:  reports that she has never smoked. She has never used smokeless tobacco. She reports that she does not drink alcohol. Her drug history is not on file. Allergies:       Allergies  Allergen Reactions  . Statins     myalgia         Medications Prior to Admission  Medication Sig Dispense Refill  . amLODipine (NORVASC) 5 MG tablet Take 5 mg by mouth 2 (two) times daily.    Marland Kitchen aspirin 325 MG EC tablet Take 325 mg by mouth daily.    Marland Kitchen BL MAGNESIUM PO Take 1 tablet by mouth daily.    . busPIRone (BUSPAR) 5 MG tablet Take 5 mg by mouth 2 (two) times daily.    . calcium carbonate (OSCAL) 1500 (600 Ca) MG TABS tablet Take 600 mg of elemental calcium by mouth 2 (two) times daily with a meal.    . cloNIDine (CATAPRES) 0.2 MG tablet Take 0.2 mg by mouth 2 (two) times daily.    . fenofibrate 160 MG tablet Take 160 mg by mouth daily.    Marland Kitchen glimepiride (AMARYL) 2 MG tablet Take 2-3 mg by mouth 2 (two) times daily. 1 tab in the  am and 1/2 hs    . isosorbide mononitrate (IMDUR) 60 MG 24 hr tablet Take 60 mg by mouth daily.    Marland Kitchen. lisinopril (PRINIVIL,ZESTRIL) 40 MG tablet Take 40 mg by mouth 2 (two) times daily.    . Melatonin 3 MG TABS Take 3 mg by mouth at bedtime.    . metoprolol succinate (TOPROL-XL) 100 MG 24 hr tablet Take 100 mg by mouth daily.     . Multiple Vitamin (MULTIVITAMIN WITH MINERALS) TABS tablet Take 1 tablet by mouth  daily.    . Multiple Vitamins-Minerals (PRESERVISION AREDS 2 PO) Take 1 capsule by mouth 2 (two) times daily.    . niacin 250 MG tablet Take 250 mg by mouth every morning.    . Omega-3 Fatty Acids (FISH OIL) 1000 MG CPDR Take 2,000 capsules by mouth 2 (two) times daily.    . pregabalin (LYRICA) 75 MG capsule Take 75 mg by mouth 2 (two) times daily.    . ranolazine (RANEXA) 500 MG 12 hr tablet Take 500 mg by mouth 2 (two) times daily.      Home: Home Living Family/patient expects to be discharged to:: Inpatient rehab Living Arrangements: Alone Available Help at Discharge: Family, Available 24 hours/day Type of Home: Apartment Home Access: Elevator Home Layout: One level Bathroom Shower/Tub: Health visitorWalk-in shower Bathroom Toilet: Handicapped height Bathroom Accessibility: Yes Home Equipment: Grab bars - toilet, Grab bars - tub/shower, Shower seat - built in, Environmental consultantWalker - 4 wheels, Environmental consultantWalker - 2 wheels  Lives With: Alone  Functional History: Prior Function Level of Independence: Independent with assistive device(s) Comments: Used a rollator all the time Functional Status:  Mobility: Bed Mobility Overal bed mobility: Needs Assistance Bed Mobility: Rolling, Sidelying to Sit Rolling: Min assist Sidelying to sit: Mod assist General bed mobility comments: in chair on arrival Transfers Overall transfer level: Needs assistance Equipment used: Rolling walker (2 wheeled) Transfers: Sit to/from Stand Sit to Stand: Mod assist Stand pivot transfers: Min assist, +2 safety/equipment General transfer comment: cues for hand placement      ADL: ADL Overall ADL's : Needs assistance/impaired Grooming: Wash/dry hands, Minimal assistance, Standing Grooming Details (indicate cue type and reason): requries single UE support at sink Upper Body Dressing : Moderate assistance Lower Body Dressing: Maximal assistance Lower Body Dressing Details (indicate cue type and reason): don mess panties  during session Toilet Transfer: Moderate assistance Toileting- Clothing Manipulation and Hygiene: Maximal assistance Functional mobility during ADLs: Minimal assistance General ADL Comments: pt reports urgency to void bladder since admission. pt requesting gramily bring adult briefs ( son present and notified)  Cognition: Cognition Overall Cognitive Status: Impaired/Different from baseline Orientation Level: Oriented X4 Cognition Arousal/Alertness: Awake/alert Behavior During Therapy: Flat affect Overall Cognitive Status: Impaired/Different from baseline Memory: Decreased short-term memory  Blood pressure 101/60, pulse 92, temperature 97.9 F (36.6 C), temperature source Oral, resp. rate 20, height 5\' 2"  (1.575 m), weight 83 kg (183 lb), SpO2 97 %. Physical Exam  Vitals reviewed. Constitutional: She is oriented to person, place, and time. She appears well-developed and well-nourished.  HENT:  Head: Normocephalic and atraumatic.  Eyes: EOM are normal. Right eye exhibits no discharge. Left eye exhibits no discharge.  Neck: Normal range of motion. Neck supple. No thyromegaly present.  Cardiovascular: Normal rate and regular rhythm.   Respiratory: Effort normal and breath sounds normal.  GI: Soft. Bowel sounds are normal. She exhibits no distension.  Musculoskeletal: She exhibits no edema or tenderness.  Neurological: She is alert and oriented to  person, place, and time.  Follows commands.  Fair awareness of deficits. Motor: RUE/RLE: 4+/5 proximal to distal LUE: Apraxia 4-/5 proxim al to distal LLE: HF, KE 3/5, ADF/PF 2-/5 Sensation intact to light touch DTRs symmetric  Skin: Skin is warm and dry.  Psychiatric: She has a normal mood and affect. Her behavior is normal.    Lab Results Last 24 Hours       Results for orders placed or performed during the hospital encounter of 09/23/16 (from the past 24 hour(s))  Glucose, capillary     Status: Abnormal   Collection Time:  09/25/16 11:45 AM  Result Value Ref Range   Glucose-Capillary 216 (H) 65 - 99 mg/dL   Comment 1 Notify RN    Comment 2 Document in Chart   Hemoglobin A1c     Status: None   Collection Time: 09/25/16 12:59 PM  Result Value Ref Range   Hgb A1c MFr Bld 5.2 4.8 - 5.6 %   Mean Plasma Glucose 103 mg/dL  Glucose, capillary     Status: Abnormal   Collection Time: 09/25/16  4:28 PM  Result Value Ref Range   Glucose-Capillary 177 (H) 65 - 99 mg/dL   Comment 1 Notify RN    Comment 2 Document in Chart   Glucose, capillary     Status: Abnormal   Collection Time: 09/25/16  9:25 PM  Result Value Ref Range   Glucose-Capillary 145 (H) 65 - 99 mg/dL   Comment 1 Notify RN    Comment 2 Document in Chart   Glucose, capillary     Status: Abnormal   Collection Time: 09/26/16  6:40 AM  Result Value Ref Range   Glucose-Capillary 142 (H) 65 - 99 mg/dL   Comment 1 Notify RN    Comment 2 Document in Chart       Imaging Results (Last 48 hours)  Ct Angio Head W Or Wo Contrast  Result Date: 09/24/2016 CLINICAL DATA:  82 year old female with left side weakness for 3-4 days and difficulty walking. Evidence of small vessel disease on noncontrast head CT yesterday. Initial encounter. EXAM: CT ANGIOGRAPHY HEAD AND NECK TECHNIQUE: Multidetector CT imaging of the head and neck was performed using the standard protocol during bolus administration of intravenous contrast. Multiplanar CT image reconstructions and MIPs were obtained to evaluate the vascular anatomy. Carotid stenosis measurements (when applicable) are obtained utilizing NASCET criteria, using the distal internal carotid diameter as the denominator. CONTRAST:  50 mL Isovue 370 [facility] FINDINGS: CTA NECK Skeleton: Multilevel cervical spine degeneration. Osteopenia. Severe chronic degenerative osseous changes about both shoulders. No acute osseous abnormality identified.  Visualized paranasal sinuses and mastoids are stable and well pneumatized. Upper chest: Negative lung apices ; mild dependent atelectasis and mosaic attenuation. No superior mediastinal lymphadenopathy. Left chest cardiac pacemaker type device partially visible. Other neck: Negative thyroid with sub cm right thyroid lobe nodules which do not meet consensus criteria for ultrasound follow-up. Larynx and pharynx soft tissue contours are within normal limits. Negative parapharyngeal and retropharyngeal spaces. Negative sublingual space, submandibular glands, and parotid glands. No cervical lymphadenopathy. Aortic arch: 4 vessel arch configuration, the left vertebral artery arises directly from the arch. Moderate calcified arch atherosclerosis. Right carotid system: No brachiocephalic artery stenosis despite calcified plaque. Tortuous right CCA origin with highly kinked appearance as seen on series 8, image 129. Otherwise no proximal right carotid stenosis. Calcified plaque at the right carotid bifurcation and right ICA bulb with  less than 50 % stenosis with respect to the distal vessel. Mildly tortuous cervical right ICA. Left carotid system: Normal left CCA origin. Tortuous proximal left CCA with mild calcified plaque but no stenosis. Intermittent soft and calcified plaque in the distal left CCA and at the left carotid bifurcation with no stenosis. Tortuous cervical left ICA at the C1 level. Minimal distal cervical left ICA calcified plaque. Vertebral arteries: Tortuous proximal right subclavian artery with calcified plaque but no significant stenosis. Calcified plaque near the right vertebral artery origin without significant stenosis (series 8, image 142). Right V1 segment calcified plaque without stenosis. Tortuous right V2 segment with no vertebral artery stenosis to the skullbase. The left vertebral artery arises directly from the arch with no proximal stenosis. The proximal left vertebral is tortuous. The left  vertebral is mildly non dominant. The left V2 segment is tortuous. No stenosis to the skullbase. CTA HEAD Posterior circulation: Calcified plaque in the right V4 segment with mild to moderate stenosis proximal to the right PICA origin which remains patent. The more distal right vertebral artery is normal to the vertebrobasilar junction. No distal left vertebral artery stenosis. The left PICA origin is patent. The basilar artery is patent with mild irregularity and no stenosis. Patent AICA and SCA origins. Fetal type bilateral PCA origins. Mild irregularity and stenosis in the left PCA P2 segment (series 12, image 25). Moderate to severe irregularity and stenosis in the left P3 superior division (same image). Multifocal severe irregularity and stenosis in the distal right P2 segment (series 12, image 18) and proximal right P3 branches (same image). Anterior circulation: Both ICA siphons are patent. Moderate siphon calcified plaque greater on the right. Mild left supraclinoid and mild to moderate right cavernous segment ICA stenoses. Normal ophthalmic and posterior communicating artery origins. Patent carotid termini. Normal MCA and left ACA origins. Mild irregularity and stenosis at the right ACA origin. Anterior communicating artery is within normal limits. Widespread high-grade stenosis throughout the right ACA A2 segment. Moderate to severe distal left A2 segment stenosis. See series 12, image 22). Preserved distal ACA enhancement bilaterally. Mildly irregular left MCA M1 segment. Moderate to severe irregularity and stenosis at the left MCA bifurcation affecting the anterior division more so, but also with high-grade proximal posterior M2 stenosis but preserved distal flow. See series 12, image 31. Right MCA M1 segment appears normal. At the right MCA bifurcation there is moderate irregularity and stenosis. There is up to moderate scattered irregularity and stenosis in the right MCA M2 and distal branches. Venous  sinuses: Patent. Anatomic variants: Left vertebral artery arises directly from the arch. Bilateral fetal type PCA origins. Delayed phase: Curvilinear enhancement about the heterogeneity in the right caudate nucleus. No associated mass effect. No acute intracranial hemorrhage identified. No other abnormal intracranial enhancement. Review of the MIP images confirms the above findings IMPRESSION: 1. Negative for emergent large vessel occlusion. 2. Mild enhancement without mass effect about the right basal ganglia heterogeneity seen yesterday, suggesting acute to subacute small vessel ischemia there. Otherwise stable CT appearance of the brain. 3. Extensive intracranial atherosclerosis including moderate or severe stenoses: 4. - Right PCA P2 and distal segments. - Right greater than Left ACA A2 segments. - Left greater than Right MCA bifurcations, M2 and distal MCA branches. 5. Mild-to-moderate stenoses of the bilateral ICA siphons, distal right vertebral artery, and left PCA. 6. Mild for age extracranial atherosclerosis, with no hemodynamically significant stenosis in the neck aside from tortuous proximal right CCA with a kinked  appearance. Electronically Signed   By: Odessa Fleming M.D.   On: 09/24/2016 12:32   Ct Angio Neck W Or Wo Contrast  Result Date: 09/24/2016 CLINICAL DATA:  82 year old female with left side weakness for 3-4 days and difficulty walking. Evidence of small vessel disease on noncontrast head CT yesterday. Initial encounter. EXAM: CT ANGIOGRAPHY HEAD AND NECK TECHNIQUE: Multidetector CT imaging of the head and neck was performed using the standard protocol during bolus administration of intravenous contrast. Multiplanar CT image reconstructions and MIPs were obtained to evaluate the vascular anatomy. Carotid stenosis measurements (when applicable) are obtained utilizing NASCET criteria, using the distal internal carotid diameter as the denominator. CONTRAST:  50 mL Isovue 370 COMPARISON:  [facility] noncontrast head CT 09/23/2016 FINDINGS: CTA NECK Skeleton: Multilevel cervical spine degeneration. Osteopenia. Severe chronic degenerative osseous changes about both shoulders. No acute osseous abnormality identified. Visualized paranasal sinuses and mastoids are stable and well pneumatized. Upper chest: Negative lung apices ; mild dependent atelectasis and mosaic attenuation. No superior mediastinal lymphadenopathy. Left chest cardiac pacemaker type device partially visible. Other neck: Negative thyroid with sub cm right thyroid lobe nodules which do not meet consensus criteria for ultrasound follow-up. Larynx and pharynx soft tissue contours are within normal limits. Negative parapharyngeal and retropharyngeal spaces. Negative sublingual space, submandibular glands, and parotid glands. No cervical lymphadenopathy. Aortic arch: 4 vessel arch configuration, the left vertebral artery arises directly from the arch. Moderate calcified arch atherosclerosis. Right carotid system: No brachiocephalic artery stenosis despite calcified plaque. Tortuous right CCA origin with highly kinked appearance as seen on series 8, image 129. Otherwise no proximal right carotid stenosis. Calcified plaque at the right carotid bifurcation and right ICA bulb with less than 50 % stenosis with respect to the distal vessel. Mildly tortuous cervical right ICA. Left carotid system: Normal left CCA origin. Tortuous proximal left CCA with mild calcified plaque but no stenosis. Intermittent soft and calcified plaque in the distal left CCA and at the left carotid bifurcation with no stenosis. Tortuous cervical left ICA at the C1 level. Minimal distal cervical left ICA calcified plaque. Vertebral arteries: Tortuous proximal right subclavian artery with calcified plaque but no significant stenosis. Calcified plaque near the right vertebral artery origin without significant stenosis (series 8, image 142). Right V1 segment calcified  plaque without stenosis. Tortuous right V2 segment with no vertebral artery stenosis to the skullbase. The left vertebral artery arises directly from the arch with no proximal stenosis. The proximal left vertebral is tortuous. The left vertebral is mildly non dominant. The left V2 segment is tortuous. No stenosis to the skullbase. CTA HEAD Posterior circulation: Calcified plaque in the right V4 segment with mild to moderate stenosis proximal to the right PICA origin which remains patent. The more distal right vertebral artery is normal to the vertebrobasilar junction. No distal left vertebral artery stenosis. The left PICA origin is patent. The basilar artery is patent with mild irregularity and no stenosis. Patent AICA and SCA origins. Fetal type bilateral PCA origins. Mild irregularity and stenosis in the left PCA P2 segment (series 12, image 25). Moderate to severe irregularity and stenosis in the left P3 superior division (same image). Multifocal severe irregularity and stenosis in the distal right P2 segment (series 12, image 18) and proximal right P3 branches (same image). Anterior circulation: Both ICA siphons are patent. Moderate siphon calcified plaque greater on the right. Mild left supraclinoid and mild to moderate right cavernous segment ICA stenoses. Normal ophthalmic and posterior communicating  artery origins. Patent carotid termini. Normal MCA and left ACA origins. Mild irregularity and stenosis at the right ACA origin. Anterior communicating artery is within normal limits. Widespread high-grade stenosis throughout the right ACA A2 segment. Moderate to severe distal left A2 segment stenosis. See series 12, image 22). Preserved distal ACA enhancement bilaterally. Mildly irregular left MCA M1 segment. Moderate to severe irregularity and stenosis at the left MCA bifurcation affecting the anterior division more so, but also with high-grade proximal posterior M2 stenosis but preserved distal flow. See  series 12, image 31. Right MCA M1 segment appears normal. At the right MCA bifurcation there is moderate irregularity and stenosis. There is up to moderate scattered irregularity and stenosis in the right MCA M2 and distal branches. Venous sinuses: Patent. Anatomic variants: Left vertebral artery arises directly from the arch. Bilateral fetal type PCA origins. Delayed phase: Curvilinear enhancement about the heterogeneity in the right caudate nucleus. No associated mass effect. No acute intracranial hemorrhage identified. No other abnormal intracranial enhancement. Review of the MIP images confirms the above findings IMPRESSION: 1. Negative for emergent large vessel occlusion. 2. Mild enhancement without mass effect about the right basal ganglia heterogeneity seen yesterday, suggesting acute to subacute small vessel ischemia there. Otherwise stable CT appearance of the brain. 3. Extensive intracranial atherosclerosis including moderate or severe stenoses: 4. - Right PCA P2 and distal segments. - Right greater than Left ACA A2 segments. - Left greater than Right MCA bifurcations, M2 and distal MCA branches. 5. Mild-to-moderate stenoses of the bilateral ICA siphons, distal right vertebral artery, and left PCA. 6. Mild for age extracranial atherosclerosis, with no hemodynamically significant stenosis in the neck aside from tortuous proximal right CCA with a kinked appearance. Electronically Signed   By: Odessa Fleming M.D.   On: 09/24/2016 12:32     Assessment/Plan: Diagnosis: Right basal ganglia infarct secondary to small vessel disease  1. Does the need for close, 24 hr/day medical supervision in concert with the patient's rehab needs make it unreasonable for this patient to be served in a less intensive setting? Yes 2. Co-Morbidities requiring supervision/potential complications: diabetes mellitus, hypertension,SSS 3. Due to bladder management, safety, skin/wound care, disease management, medication  administration and patient education, does the patient require 24 hr/day rehab nursing? Yes 4. Does the patient require coordinated care of a physician, rehab nurse, PT (1-2 hrs/day, 5 days/week) and OT (1-2 hrs/day, 5 days/week) to address physical and functional deficits in the context of the above medical diagnosis(es)? Yes Addressing deficits in the following areas: balance, endurance, locomotion, strength, transferring, bowel/bladder control, bathing, dressing, toileting and psychosocial support 5. Can the patient actively participate in an intensive therapy program of at least 3 hrs of therapy per day at least 5 days per week? Yes 6. The potential for patient to make measurable gains while on inpatient rehab is excellent 7. Anticipated functional outcomes upon discharge from inpatient rehab are modified independent and supervision  with PT, modified independent with OT, n/a with SLP. 8. Estimated rehab length of stay to reach the above functional goals is: 15-19 days. 9. Does the patient have adequate social supports and living environment to accommodate these discharge functional goals? Potentially 10. Anticipated D/C setting: Home 11. Anticipated post D/C treatments: HH therapy and Home excercise program 12. Overall Rehab/Functional Prognosis: good  RECOMMENDATIONS: This patient's condition is appropriate for continued rehabilitative care in the following setting: CIR Patient has agreed to participate in recommended program. Yes Note that insurance prior authorization may be required  for reimbursement for recommended care.  Comment: Rehab Admissions Coordinator to follow up.  Maryla Morrow, MD, [facility] 09/26/2016    Revision History

## 2016-09-27 NOTE — Evaluation (Signed)
Physical Therapy Assessment and Plan  Patient Details  Name: Charlene Greene MRN: [id number] Date of Birth: [birth year]  PT Diagnosis: Abnormality of gait, Hemiplegia non-dominant and Muscle weakness Rehab Potential: Excellent ELOS: 10-14 days   Today's Date: 09/27/2016 PT Individual Time: 1030-1130 PT Individual Time Calculation (min): 60 min     Problem List: Patient Active Problem List   Diagnosis Date Noted  . Acute renal insufficiency 09/27/2016  . Urinary urgency 09/27/2016  . Basal ganglia infarction ([place]) 09/26/2016  . Neuropathic pain   . Benign essential HTN   . Slow transit constipation   . Elevated creatine kinase   . Accelerated hypertension 09/24/2016  . Diabetes mellitus ([place]) 09/24/2016  . Hyperlipidemia 09/24/2016  . Sick sinus syndrome ([place]) 09/24/2016  . CVA (cerebral vascular accident) ([place]) 09/23/2016  . Stroke (cerebrum) ([place]) 09/23/2016    Past Medical History:  Past Medical History:  Diagnosis Date  . Diabetes mellitus without complication ([place])   . Hypertension   . Pacemaker    Past Surgical History:  Past Surgical History:  Procedure Laterality Date  . PACEMAKER PLACEMENT      Assessment & Plan Clinical Impression: Patient is a 82 y.o.right handed femalewith history of diabetes mellitus, hypertension,SSSwith pacemaker maintain on aspirin 325 mg daily. Patient is a resident at [facility] independent living where she has been for the past 2 and half years. Presented 09/23/2016 with left-sided weakness 3-4 days and difficulty naming objects. At baseline patient was independent with assistive device prior to admission. CT of the head showed abnormal appearance of the right basal ganglia region that could represent a combination of old an acute infarct. Patient did not receive TPA. CT angiogram of head and neck with no emergent large vessel occlusion. MRI not completed secondary to pacemaker. Echocardiogram with ejection fraction of 40% grade  1 diastolic function no regional wall motion abnormalities. Presently maintained on Plavix for CVA prophylaxis. Subcutaneous Lovenox for DVT prophylaxis. Patient transferred to CIR on 09/26/2016 .   Patient currently requires mod with mobility secondary to muscle weakness, decreased cardiorespiratoy endurance and decreased standing balance, decreased postural control, hemiplegia and decreased balance strategies.  Prior to hospitalization, patient was modified independent  with mobility and lived with Alone in a [place] home.  Home access is  Elevator.  Patient will benefit from skilled PT intervention to maximize safe functional mobility, minimize fall risk and decrease caregiver burden for planned discharge home with intermittent assist.  Anticipate patient will benefit from follow up [facility] at discharge.  PT - End of Session Activity Tolerance: Tolerates 10 - 20 min activity with multiple rests Endurance Deficit: Yes PT Assessment Rehab Potential (ACUTE/IP ONLY): Excellent Barriers to Discharge: Decreased caregiver support PT Patient demonstrates impairments in the following area(s): Balance;Endurance;Motor;Safety;Sensory;Skin Integrity PT Transfers Functional Problem(s): Bed Mobility;Bed to Chair;Car;Furniture;Floor PT Locomotion Functional Problem(s): Ambulation;Wheelchair Mobility;Stairs PT Plan PT Intensity: Minimum of 1-2 x/day ,45 to 90 minutes PT Frequency: 5 out of 7 days PT Duration Estimated Length of Stay: 10-14 days PT Treatment/Interventions: Ambulation/gait training;Balance/vestibular training;Community reintegration;Discharge planning;DME/adaptive equipment instruction;Functional mobility training;Neuromuscular re-education;Pain management;Patient/family education;Psychosocial support;Skin care/wound management;Splinting/orthotics;Stair training;Therapeutic Activities;Therapeutic Exercise;UE/LE Strength taining/ROM;UE/LE Coordination activities;Visual/perceptual  remediation/compensation;Wheelchair propulsion/positioning;Functional electrical stimulation PT Transfers Anticipated Outcome(s): Mod I with LRAD  PT Locomotion Anticipated Outcome(s): mod I for house hold distances. with LRAD  PT Recommendation Follow Up Recommendations: Home health PT Patient destination: Home Equipment Recommended: To be determined     Skilled Therapeutic Intervention Patient received supine in bed and agreeable to PT. PT instructed  patient in PT evaluation and initiated treatment intervention; see below for results. PT instructed patient in bed mobility, transfers including bed<>chair and car transfer as listed below, stairs, and gait training. PT educated patient in rehab potential and goals as well as possible treatment interventions and PT d/c recommendations. Patient returned to room and left sitting in [facility] with call bell in reach with all needs met.   PT Evaluation Precautions/Restrictions Precautions Precautions: Fall Restrictions Weight Bearing Restrictions: No General   Vital Signs Pain Pain Assessment Pain Assessment: 0-10 Pain Score: 2  Pain Location: Leg Pain Orientation: Left Pain Descriptors / Indicators: Sore Patients Stated Pain Goal: 0 Multiple Pain Sites: No Home Living/Prior Functioning Home Living Available Help at Discharge: Family;Available 24 hours/day Type of Home: Apartment Home Access: Elevator Home Layout: One level Bathroom Shower/Tub: Multimedia programmer: Handicapped height Bathroom Accessibility: Yes  Lives With: Alone Prior Function Level of Independence: Requires assistive device for independence  Able to Take Stairs?: No Driving: No 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 has to walk to and from dinning room twice a day.  Comments: Used a rollator all the time Vision/Perception  Vision - Assessment Additional Comments: baseline macular degeneration.    Cognition Orientation Level: Oriented X4 Sensation Sensation Light Touch: Impaired by gross assessment (mild decreased light touch appreciation in the L foot distal to the anke. (premorbid)) Coordination Finger Nose Finger Test: decreased speed bilaterally. no noticable differance.  Motor  Motor Motor: Hemiplegia Motor - Skilled Clinical Observations: mild weakness on the LLE  Mobility Bed Mobility Bed Mobility: Rolling Right;Rolling Left;Sit to Supine;Supine to Sit Rolling Right: 3: Mod assist Rolling Right Details: Manual facilitation for placement;Manual facilitation for weight shifting;Verbal cues for safe use of DME/AE;Verbal cues for gait pattern Rolling Left: 4: Min assist Rolling Left Details: Verbal cues for precautions/safety;Verbal cues for safe use of DME/AE Supine to Sit: 3: Mod assist Supine to Sit Details: Verbal cues for precautions/safety;Verbal cues for gait pattern;Verbal cues for safe use of DME/AE Sit to Supine: 3: Mod assist Sit to Supine - Details: Verbal cues for technique;Verbal cues for precautions/safety;Verbal cues for gait pattern;Verbal cues for safe use of DME/AE Transfers Transfers: Yes Sit to Stand: 3: Mod assist Sit to Stand Details: Verbal cues for precautions/safety;Verbal cues for technique;Verbal cues for safe use of DME/AE Stand Pivot Transfers: 3: Mod assist Stand Pivot Transfer Details: Verbal cues for precautions/safety;Verbal cues for gait pattern;Verbal cues for technique;Verbal cues for safe use of DME/AE Locomotion  Ambulation Ambulation: Yes Ambulation/Gait Assistance: 4: Min assist Ambulation Distance (Feet): 55 Feet Assistive device: Rolling walker Ambulation/Gait Assistance Details: Verbal cues for precautions/safety;Verbal cues for gait pattern;Verbal cues for safe use of DME/AE Gait Gait: Yes Gait Pattern: Impaired Gait Pattern: Step-to pattern;Decreased step length - left Gait velocity: decreased Stairs / Additional  Locomotion Stairs: Yes Stairs Assistance: 4: Min assist;3: Mod assist Stairs Assistance Details: Verbal cues for sequencing;Verbal cues for technique;Verbal cues for precautions/safety;Verbal cues for gait pattern Stair Management Technique: Two rails Number of Stairs: 8 Height of Stairs: 4  Trunk/Postural Assessment  Cervical Assessment Cervical Assessment: Exceptions to [facility] (forward head) Thoracic Assessment Thoracic Assessment: Exceptions to [facility] (flexed posture) Lumbar Assessment Lumbar Assessment: Exceptions to WFL (decreased lordosis) Postural Control Postural Control: Deficits on evaluation (intermittent posterior lean. )  Balance Balance Balance Assessed: Yes Static Sitting Balance Static Sitting - Level of Assistance: 6: Modified independent (Device/Increase time) Dynamic Sitting Balance Dynamic Sitting - Level of Assistance: 5: Stand by  assistance Static Standing Balance Static Standing - Level of Assistance: 4: Min assist (no UE support ) Dynamic Standing Balance Dynamic Standing - Level of Assistance: 4: Min assist (with BUE Support. ) Extremity Assessment      RLE Assessment RLE Assessment: Within Functional Limits (4+/5 throughout; proximal to distal) LLE Assessment LLE Assessment: Exceptions to WFL (WFL AROM; 4-/5 proximal to distal. )   See Function Navigator for Current Functional Status.   Refer to Care Plan for Long Term Goals  Recommendations for other services: None  Discharge Criteria: Patient will be discharged from PT if patient refuses treatment 3 consecutive times without medical reason, if treatment goals not met, if there is a change in medical status, if patient makes no progress towards goals or if patient is discharged from hospital.  The above assessment, treatment plan, treatment alternatives and goals were discussed and mutually agreed upon: by patient  Lorie Phenix 09/27/2016, 2:26 PM

## 2016-09-27 NOTE — Evaluation (Signed)
Occupational Therapy Assessment and Plan  Patient Details  Name: Charlene Greene MRN: [id number] Date of Birth: Aug 29, 1922  OT Diagnosis: hemiplegia affecting non-dominant side, muscle weakness (generalized) and coordination disorder Rehab Potential: Rehab Potential (ACUTE ONLY): Fair ELOS: 10-14 days   Today's Date: 09/27/2016 OT Individual Time: 0900-1015 and [id number] OT Individual Time Calculation (min): 75 min and 58 min      Problem List: Patient Active Problem List   Diagnosis Date Noted  . Acute renal insufficiency 09/27/2016  . Urinary urgency 09/27/2016  . Basal ganglia infarction ([place]) 09/26/2016  . Neuropathic pain   . Benign essential HTN   . Slow transit constipation   . Elevated creatine kinase   . Accelerated hypertension 09/24/2016  . Diabetes mellitus ([place]) 09/24/2016  . Hyperlipidemia 09/24/2016  . Sick sinus syndrome ([place]) 09/24/2016  . CVA (cerebral vascular accident) ([place]) 09/23/2016  . Stroke (cerebrum) ([place]) 09/23/2016    Past Medical History:  Past Medical History:  Diagnosis Date  . Diabetes mellitus without complication ([place])   . Hypertension   . Pacemaker    Past Surgical History:  Past Surgical History:  Procedure Laterality Date  . PACEMAKER PLACEMENT      Assessment & Plan Clinical Impression: Patient is a 82 y.o. year old female with history of diabetes mellitus, hypertension,SSSwith pacemaker maintain on aspirin 325 mg daily. Patient is a resident at [facility] independent living where she has been for the past 2 and half years. Presented 09/23/2016 with left-sided weakness 3-4 days and difficulty naming objects. At baseline patient was independent with assistive device prior to admission. CT of the head showed abnormal appearance of the right basal ganglia region that could represent a combination of old an acute infarct. Patient did not receive TPA. CT angiogram of head and neck with no emergent large vessel occlusion. MRI not  completed secondary to pacemaker. Echocardiogram with ejection fraction of 63% grade 1 diastolic function no regional wall motion abnormalities. Presently maintained on Plavix for CVA prophylaxis. Subcutaneous Lovenox for DVT prophylaxis. Tolerating a regular diet.    Patient transferred to CIR on 09/26/2016 .    Patient currently requires mod with basic self-care skills and IADL secondary to muscle weakness, decreased cardiorespiratoy endurance, decreased coordination and decreased motor planning and decreased sitting balance, decreased standing balance, hemiplegia and decreased balance strategies.  Prior to hospitalization, patient could complete ADLs with modified independent .  Patient will benefit from skilled intervention to increase independence with basic self-care skills prior to discharge assisted living.  Anticipate patient will require intermittent supervision and follow up home health.  OT - End of Session Activity Tolerance: Decreased this session Endurance Deficit: Yes Endurance Deficit Description: multiple rest breaks secondary to fatigue OT Assessment Rehab Potential (ACUTE ONLY): Fair OT Patient demonstrates impairments in the following area(s): Balance;Endurance;Motor;Pain;Safety OT Basic ADL's Functional Problem(s): Grooming;Bathing;Dressing;Toileting OT Advanced ADL's Functional Problem(s): Laundry OT Transfers Functional Problem(s): Toilet;Tub/Shower OT Additional Impairment(s): None OT Plan OT Intensity: Minimum of 1-2 x/day, 45 to 90 minutes OT Frequency: 5 out of 7 days OT Duration/Estimated Length of Stay: 10-14 days OT Treatment/Interventions: Medical illustrator training;Community reintegration;Discharge planning;Functional mobility training;Psychosocial support;Therapeutic Activities;UE/LE Coordination activities;Patient/family education;UE/LE Strength taining/ROM;DME/adaptive equipment instruction;Neuromuscular re-education;Self Care/advanced ADL  retraining;Therapeutic Exercise;Wheelchair propulsion/positioning OT Self Feeding Anticipated Outcome(s): n/a OT Basic Self-Care Anticipated Outcome(s): mod I  OT Toileting Anticipated Outcome(s): mod I  OT Bathroom Transfers Anticipated Outcome(s): mod I - toilet, supervision -shower OT Recommendation Patient destination: Assisted Living Follow Up Recommendations: Home health OT Equipment Recommended: To  be determined   Skilled Therapeutic Intervention  Session 1: Upon entering the room, pt supine in bed with no c/o pain. OT educated pt on OT purpose, POC, and goals with pt verbalizing understanding and agreement. Pt performed sit >stand with mod A for lifting. Pt ambulated with RW and min A for safety to shower. Pt bathing from shower level with min A this session. Pt requiring increased rest break secondary to fatigue. OT encouraged use of L UE with functional tasks. Pt returning to bed for dressing tasks and returning to bed at end of session secondary to [place].  Session 2: Upon entering the room, pt supine in bed with son present who does not stay for therapy. Pt with no c/o pain this session. Pt performed stand pivot transfer from bed >wheelchair with RW and mod A. OT propelled pt to day room for time management. OT educated and demonstrated use of red, medium soft theraputty for L UE strength and coordination. Paper handout given of exercises and pt returning demonstration with min verbal cues for proper technique. Pt suddently turns to therapist and states, " I peed." Pt incontinent of bladder and therapist returning pt to room to assist her with hygiene. Pt returning to bed at end of session secondary to fatigue. Call bell and all needed items within reach.    OT Evaluation Precautions/Restrictions  Precautions Precautions: Fall Restrictions Weight Bearing Restrictions: No General   Vital Signs Therapy Vitals Pulse Rate: 60 BP: (!) 186/71 Pain Pain Assessment Pain  Assessment: No/denies pain Home Living/Prior Functioning Home Living Available Help at Discharge: Family, Available 24 hours/day Type of Home: Independent living facility Home Access: Elevator Home Layout: One level Bathroom Shower/Tub: Multimedia programmer: Handicapped height Bathroom Accessibility: Yes  Lives With: Alone Prior Function Level of Independence: Requires assistive device for independence  Able to Take Stairs?: No Driving: No 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 has to walk to and from dinning room twice a day.  Comments: Used a rollator all the time ADL   Vision/Perception  Vision- History Baseline Vision/History: Wears glasses;Macular Degeneration Wears Glasses: At all times Patient Visual Report: No change from baseline Vision- Assessment Vision Assessment?: Vision impaired- to be further tested in functional context  Cognition Orientation Level: Person;Place;Situation Person: Oriented Place: Oriented Situation: Oriented Year: 2017 Month: November Day of Week: Correct Immediate Memory Recall: Sock;Blue;Bed Memory Recall: Sock;Blue;Bed Memory Recall Sock: Without Cue Memory Recall Blue: Without Cue Memory Recall Bed: With Cue Sensation Sensation Light Touch: Impaired by gross assessment Coordination Fine Motor Movements are Fluid and Coordinated: No Motor  Motor Motor: Hemiplegia Motor - Skilled Clinical Observations: mild weakness on the LLE and LUE Mobility  Bed Mobility Bed Mobility: Rolling Right;Rolling Left;Sit to Supine;Supine to Sit Rolling Right: 3: Mod assist Rolling Right Details: Manual facilitation for placement;Manual facilitation for weight shifting;Verbal cues for safe use of DME/AE;Verbal cues for gait pattern Rolling Left: 4: Min assist Rolling Left Details: Verbal cues for precautions/safety;Verbal cues for safe use of DME/AE Supine to Sit: 3: Mod assist Supine to Sit  Details: Verbal cues for precautions/safety;Verbal cues for gait pattern;Verbal cues for safe use of DME/AE Sit to Supine - Details: Verbal cues for technique;Verbal cues for precautions/safety;Verbal cues for gait pattern;Verbal cues for safe use of DME/AE Transfers Sit to Stand: 3: Mod assist Sit to Stand Details: Verbal cues for precautions/safety;Verbal cues for technique;Verbal cues for safe use of DME/AE  Trunk/Postural Assessment  Cervical Assessment Cervical Assessment: Exceptions  to [facility] (forward head) Thoracic Assessment Thoracic Assessment: Exceptions to [facility] (flexed posture) Lumbar Assessment Lumbar Assessment: Exceptions to [facility] Postural Control Postural Control: Deficits on evaluation  Balance Balance Balance Assessed: Yes Static Sitting Balance Static Sitting - Level of Assistance: 6: Modified independent (Device/Increase time) Dynamic Sitting Balance Dynamic Sitting - Level of Assistance: 5: Stand by assistance Static Standing Balance Static Standing - Level of Assistance: 4: Min assist Dynamic Standing Balance Dynamic Standing - Level of Assistance: 4: Min assist Extremity/Trunk Assessment RUE Assessment RUE Assessment: Within Functional Limits LUE Assessment LUE Assessment: Exceptions to [facility] (3-/5)   See Function Navigator for Current Functional Status.   Refer to Care Plan for Long Term Goals  Recommendations for other services: None  Discharge Criteria: Patient will be discharged from OT if patient refuses treatment 3 consecutive times without medical reason, if treatment goals not met, if there is a change in medical status, if patient makes no progress towards goals or if patient is discharged from hospital.  The above assessment, treatment plan, treatment alternatives and goals were discussed and mutually agreed upon: by patient  Gypsy Decant 09/27/2016, 8:57 PM

## 2016-09-27 NOTE — Progress Notes (Signed)
Charlene PippinMelissa Latoya Diskin [facility] Admission Coordinator Signed Physical Medicine and Rehabilitation  PMR Pre-admission Date of Service: 09/26/2016 4:50 PM  Related encounter: ED to Hosp-Admission (Discharged) [facility]       [] Hide copied text PMR Admission Coordinator Pre-Admission Assessment  Patient: Charlene Greene is an 82 y.o., female MRN: [id number] DOB: May 16, 1922 Height: 5\' 2"  (157.5 cm) Weight: 83 kg (183 lb)                                                                                                                                                  Insurance Information HMO:     PPO:      PCP:      IPA:      80/20:      OTHER:  PRIMARY: Medicare A & B      Policy#: [id number]      Subscriber: Self CM Name:       Phone#:      Fax#:  Pre-Cert#: eligible via [employer] One online portal       Employer: retired Benefits:  Phone #:      Name:  Eff. Date: 08/12/1989     Deduct: $1,316      Out of Pocket Max: none      Life Max: unlimited  CIR: 100%      SNF: 100% days 1-20; 80% days 21-100 Outpatient: 80%      Co-Pay: 20% Home Health: 100%      Co-Pay: none           DME: 80%     Co-Pay: 20% Providers: patient's choice  SECONDARY: GEHA      Policy#: [id number] geha      Subscriber: Self CM Name:       Phone#:      Fax#:  Pre-Cert#:       Employer: retired Benefits:  Phone #: [phone]     Name: self Eff. 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 Patient is a [facility] at [facility] where she has been for the past 2 and half years. Presented 09/23/2016 with left-sided weakness 3-4 days and difficulty naming objects. At baseline patient was independent with assistive device prior to admission. CT of the head showed abnormal appearance of the right basal ganglia region that could represent a combination of old an acute infarct. Patient did not receive TPA. CT angiogram of head and neck with no emergent large vessel occlusion. MRI not completed secondary to pacemaker. Echocardiogram with ejection fraction of 70% grade 1 diastolic function no regional wall motion abnormalities. Presently maintained on Plavix for CVA prophylaxis. Subcutaneous Lovenox for DVT prophylaxis. Tolerating a regular diet. Physical and occupational therapy evaluations completed with recommendations of physical medicine rehabilitation consult. Patient was admitted for comprehensive rehabilitation program 09/26/16.  NIH Total: 1    Past Medical History      Past Medical History:  Diagnosis Date  . Diabetes mellitus without complication (HCC)   . Hypertension   . Pacemaker     Family History  family history is not on file.  Prior Rehab/Hospitalizations:  Has the patient had major surgery during 100 days prior to admission? No  Current Medications   Current Facility-Administered Medications:  .  amLODipine (NORVASC) tablet 5 mg, 5 mg, Oral, BID, Ripudeep K Rai, MD, 5 mg at 09/26/16 0921 .  busPIRone (BUSPAR) tablet 5 mg, 5 mg, Oral, BID, Rhetta MuraJai-Gurmukh Samtani, MD, 5 mg at 09/26/16 0922 .  clopidogrel (PLAVIX) tablet 75 mg, 75 mg, Oral, Daily, Ripudeep K Rai, MD, 75 mg at 09/26/16 0922 .  enoxaparin (LOVENOX)  injection 40 mg, 40 mg, Subcutaneous, Q24H, Rhetta MuraJai-Gurmukh Samtani, MD, 40 mg at 09/25/16 1759 .  ezetimibe (ZETIA) tablet 10 mg, 10 mg, Oral, Daily, Ripudeep K Rai, MD, 10 mg at 09/26/16 [id number] .  fenofibrate tablet 160 mg, 160 mg, Oral, Daily, Rhetta MuraJai-Gurmukh Samtani, MD, 160 mg at 09/26/16 [id number] .  hydrALAZINE (APRESOLINE) injection 10 mg, 10 mg, Intravenous, Once, Ripudeep K Rai, MD .  hydrALAZINE (APRESOLINE) injection 10 mg, 10 mg, Intravenous, Q6H PRN, Layne BentonSharon L Biby, NP .  insulin aspart (novoLOG) injection 0-5 Units, 0-5 Units, Subcutaneous, QHS, Ripudeep K Rai, MD .  insulin aspart (novoLOG) injection 0-9 Units, 0-9 Units, Subcutaneous, TID WC, Ripudeep K Rai, MD, 3 Units at 09/26/16 1230 .  isosorbide mononitrate (IMDUR) 24 hr tablet 30 mg, 30 mg, Oral, Daily, Rhetta MuraJai-Gurmukh Samtani, MD, 30 mg at 09/26/16 0922 .  lisinopril (PRINIVIL,ZESTRIL) tablet 40 mg, 40 mg, Oral, BID, Ripudeep K Rai, MD, 40 mg at 09/26/16 [id number] .  Melatonin TABS 3 mg, 3 mg, Oral, QHS, Rhetta MuraJai-Gurmukh Samtani, MD, 3 mg at 09/25/16 2220 .  metoprolol succinate (TOPROL-XL) 24 hr tablet 50 mg, 50 mg, Oral, Daily, Rhetta MuraJai-Gurmukh Samtani, MD, 50 mg at 09/25/16 2057 .  niacin tablet 250 mg, 250 mg, Oral, Erroll LunaBH-q7a, Michael A Maccia, RPH, 250 mg at 09/25/16 0630 .  polyethylene glycol (MIRALAX / GLYCOLAX) packet 17 g, 17 g, Oral, Daily PRN, Zannie CovePreetha Joseph, MD .  pregabalin (LYRICA) capsule 75 mg, 75 mg, Oral, BID, Rhetta MuraJai-Gurmukh Samtani, MD, 75 mg at 09/26/16 0921 .  ranolazine (RANEXA) 12 hr tablet 500 mg, 500 mg, Oral, BID, Rhetta MuraJai-Gurmukh Samtani, MD, 500 mg at 09/26/16 0922 .  senna-docusate (Senokot-S) tablet 1 tablet, 1 tablet, Oral, BID, Zannie CovePreetha Joseph, MD, 1 tablet at 09/26/16 1229  Patients Current Diet: Diet Heart Room service appropriate? Yes; Fluid consistency: Thin  Precautions / Restrictions Precautions Precautions: Fall Restrictions Weight Bearing Restrictions: No   Has the patient had 2 or more falls or a fall with injury in the  past year?Yes daughter-in-law reports that for a week that patient fell 2-3 times, but did not sustain an injury.    Prior Activity Level Limited Community (1-2x/wk): Prior to admission patient lived alone at an independent living facility.  She did everything for herself except cook and drive.  She went out to eat about once a week with family.    Home Assistive Devices / Equipment Home Assistive Devices/Equipment: None Home Equipment: Grab bars - toilet, Grab bars - tub/shower, Shower seat - built in, Environmental consultant - 4 wheels, Environmental consultant - 2 wheels  Prior Device Use: Indicate devices/aids used by the patient prior to current illness, exacerbation or injury? Walker  Prior Functional Level Prior Function Level of Independence: Independent with assistive device(s) Comments: Used a rollator all the time  Self Care: Did the patient need help bathing, dressing, using the toilet or eating?  Independent  Indoor Mobility: Did the patient need assistance with walking from room to room (with or without device)? Independent  Stairs: Did the patient need assistance with internal or external stairs (with or without device)? Dependent  Functional Cognition: Did the patient need help planning regular tasks such as shopping or remembering to take medications? Independent  Current Functional Level Cognition Overall Cognitive Status: Impaired/Different from baseline Orientation Level: Oriented X4    Extremity Assessment (includes Sensation/Coordination) Upper Extremity Assessment: LUE deficits/detail LUE Deficits / Details: baseline deficits with shoulder flexion per son for years. Pt able to complete shoulder flexion 90 degrees 3 out 5 MMT  LUE Coordination: decreased gross motor  Lower Extremity Assessment: Defer to PT evaluation LLE Deficits / Details: Decreased strength and AROM grossly. Hip flexors 3/5, quads 4-/5, hamstrings 4-/5 LLE Sensation: decreased light touch, history of peripheral  neuropathy (Bilaterally lower legs and feet)   ADLs Overall ADL's : Needs assistance/impaired Grooming: Wash/dry hands, Minimal assistance, Standing Grooming Details (indicate cue type and reason): requries single UE support at sink Upper Body Dressing : Moderate assistance Lower Body Dressing: Maximal assistance Lower Body Dressing Details (indicate cue type and reason): don mess panties during session Toilet Transfer: Moderate assistance Toileting- Clothing Manipulation and Hygiene: Maximal assistance Functional mobility during ADLs: Minimal assistance General ADL Comments: pt reports urgency to void bladder since admission. pt requesting gramily bring adult briefs ( son present and notified)   Mobility Overal bed mobility: Needs Assistance Bed Mobility: Rolling, Sidelying to Sit Rolling: Min assist Sidelying to sit: Mod assist General bed mobility comments: In chair on arrival.    Transfers Overall transfer level: Needs assistance Equipment used: Rolling walker (2 wheeled) Transfers: Sit to/from Stand Sit to Stand: Mod assist Stand pivot transfers: Min assist, +2 safety/equipment General transfer comment: cues for hand placement   Ambulation / Gait / Stairs / Wheelchair Mobility Ambulation/Gait Ambulation/Gait assistance: Min assist Ambulation Distance (Feet): 75 Feet Assistive device: Rolling walker (2 wheeled) Gait Pattern/deviations: Step-through pattern, Decreased stride length, Trunk flexed, Narrow base of support General Gait Details: Slow and guarded gait. Pt was cued for improved posture and forward gaze throughout gait training. Chair follow required.  Gait velocity: Decreased Gait velocity interpretation: Below normal speed for age/gender   Posture / Balance Balance Overall balance assessment: Needs assistance Sitting-balance support: Feet supported, No upper extremity supported Sitting balance-Leahy Scale:  Fair Postural control: Left lateral lean Standing balance  support: Bilateral upper extremity supported, During functional activity Standing balance-Leahy Scale: Poor   Special needs/care consideration BiPAP/CPAP: No CPM: No Continuous Drip IV: No Dialysis: No         Life Vest: No  Oxygen: No Special Bed: No Trach Size: No Wound Vac (area): No       Skin: WDL                               Bowel mgmt: 09/23/16 Bladder mgmt: Continent, but patient reports new urgency with an accident x1 since CVA Diabetic mgmt: Yes patient reports testing her blood sugar in the morning PTA and managed it with oral medication and diet.    Previous Home Environment Living Arrangements: Alone  Lives With: Alone Available Help at Discharge: Family, Available 24 hours/day Type of Home: Apartment Home Layout: One level Home Access: Engineer, maintenance (IT) Shower/Tub: Health visitor: Handicapped height Bathroom Accessibility: Yes Home Care Services: No  Discharge Living Setting Plans for Discharge Living Setting: Other (Comment) (Independent Living at [facility] ) Type of Home at Discharge: Other (Comment) (studio apartment ) Discharge Home Layout: One level Discharge Home Access: Elevator (2nd floor apartment ) Discharge Bathroom Shower/Tub: Walk-in shower Discharge Bathroom Toilet: Handicapped height Discharge Bathroom Accessibility: Yes How Accessible: Accessible via walker Does the patient have any problems obtaining your medications?: No  Social/Family/Support Systems Patient Roles: Parent Anticipated Caregiver: Sons and daughter-in-law Anticipated Caregiver's Contact Information: see emergency contact form  Ability/Limitations of Caregiver: They work; patient lived alone PTA Caregiver Availability: Intermittent Discharge Plan Discussed with Primary Caregiver: Yes Is Caregiver In Agreement with Plan?: Yes Does Caregiver/Family have Issues with Lodging/Transportation while Pt is in Rehab?: No  Goals/Additional  Needs Patient/Family Goal for Rehab: PT/OT Mod I  Expected length of stay: 15-19 days Cultural Considerations: Attends [employer]  Dietary Needs: Carb Mod and Heart Healthy  Equipment Needs: TBD Special Service Needs: None Additional Information: None Pt/Family Agrees to Admission and willing to participate: Yes Program Orientation Provided & Reviewed with Pt/Caregiver Including Roles  & Responsibilities: Yes Additional Information Needs: None Information Needs to be Provided By: N/A  Decrease burden of Care through IP rehab admission: No           Possible need for SNF placement upon discharge: No  Patient Condition: This patient's condition remains as documented in the consult dated 09/26/16, in which the Rehabilitation Physician determined and documented that the patient's condition is appropriate for intensive rehabilitative care in an inpatient rehabilitation facility. Will admit to inpatient rehab today.   Preadmission Screen Completed By:  Charlene Greene, 09/26/2016 4:56 PM ______________________________________________________________________   Discussed status with Dr. Allena Katz on 09/26/16 at 1702 and received telephone approval for admission today.  Admission Coordinator:  Charlene Greene, time 1702/Date 09/26/16       Cosigned by: Ankit Karis Juba, MD at 09/26/2016 5:04 PM  Revision History

## 2016-09-27 NOTE — Progress Notes (Signed)
[facility] PHYSICAL MEDICINE & REHABILITATION     PROGRESS NOTE    Subjective/Complaints:  Had a fair night. Bladder quite frequent---8 to 10 times from yesterday afternoon to this morning. Urgency to empty is high. Denies dysuria. Poor sleep as a result  ROS: Pt denies fever, rash/itching, headache, blurred or double vision, nausea, vomiting, abdominal pain, diarrhea, chest pain, shortness of breath, palpitations, dysuria, dizziness, neck pain, back pain, bleeding, anxiety, or depression   Objective: Vital Signs: Blood pressure (!) 164/80, pulse 80, temperature 98.7 F (37.1 C), temperature source Oral, resp. rate 18, height 5\' 2"  (1.575 m), weight 82.4 kg (181 lb 9.6 oz), SpO2 93 %. No results found.  Recent Labs  09/26/16 2033 09/27/16 0701  WBC 7.0 7.2  HGB 13.5 14.4  HCT 40.2 43.4  PLT 211 208    Recent Labs  09/26/16 2033 09/27/16 0701  NA  --  140  K  --  4.3  CL  --  100*  GLUCOSE  --  171*  BUN  --  18  CREATININE 1.50* 1.23*  CALCIUM  --  9.2   CBG (last 3)   Recent Labs  09/26/16 1645 09/26/16 2106 09/27/16 0637  GLUCAP 163* 168* 148*    Wt Readings from Last 3 Encounters:  09/26/16 82.4 kg (181 lb 9.6 oz)  09/23/16 83 kg (183 lb)    Physical Exam:  Constitutional: She is oriented to person, place, and time. She appears well-developed and well-nourished.  HENT:  Head: Normocephalic and atraumatic.  Eyes: EOMI, anicteric.  Neck: Normal range of motion. Neck supple.  Cardiovascular: RRR.   Respiratory: clear bilat.  GI: Soft. NTl.  Musculoskeletal: She exhibits no edema or tenderness.  Neurological: She is alert and oriented to person, place, and time.  Follows commands.  Fair awareness of deficits. Motor: RUE/RLE: 4+/5 throughout LUE: Apraxia 4-/5 proxim al to distal, +PD LLE: HF, KE 3/5, ADF/PF 2 to 3/5 Sensation intact to light touch bilaterally DTRs symmetric   Skin: Skin is warm and dry.  Psychiatric: fair insight and awareness.    Assessment/Plan: 1. Functional deficits secondary to right basal ganglia infarct which require 3+ hours per day of interdisciplinary therapy in a comprehensive inpatient rehab setting. Physiatrist is providing close team supervision and 24 hour management of active medical problems listed below. Physiatrist and rehab team continue to assess barriers to discharge/monitor patient progress toward functional and medical goals.  Function:  Bathing Bathing position      Bathing parts      Bathing assist        Upper Body Dressing/Undressing Upper body dressing                    Upper body assist        Lower Body Dressing/Undressing Lower body dressing                                  Lower body assist        Toileting Toileting     Toileting steps completed by helper: Adjust clothing prior to toileting, Performs perineal hygiene, Adjust clothing after toileting    Toileting assist Assist level: More than reasonable time, Touching or steadying assistance (Pt.75%), Two helpers   Transfers Chair/bed Optician, dispensingtransfer             Locomotion Ambulation           Wheelchair  Cognition Comprehension Comprehension assist level: Follows basic conversation/direction with no assist  Expression Expression assist level: Expresses basic needs/ideas: With no assist  Social Interaction Social Interaction assist level: Interacts appropriately with others - No medications needed.  Problem Solving Problem solving assist level: Solves basic 75 - 89% of the time/requires cueing 10 - 24% of the time  Memory Memory assist level: Recognizes or recalls 50 - 74% of the time/requires cueing 25 - 49% of the time    Medical Problem List and Plan: 1.  Left hemiparesis secondary to right basal ganglia infarct secondary to small vessel disease  -begin therapies today 2.  DVT Prophylaxis/Anticoagulation: Subcutaneous Lovenox. Monitor platelet counts and any signs of  bleeding 3. Pain Management: Lyrica 75 mg twice a day--controlled 4. Mood: BuSpar 5 mg twice a day, melatonin 3 mg daily at bedtime resumed for sleep 5. Neuropsych: This patient is capable of making decisions on her own behalf. 6. Skin/Wound Care: loca care 7. Fluids/Electrolytes/Nutrition: encouragePO  -I personally reviewed the patient's labs today.  8. Sick sinus syndrome with pacemaker. Cardiac regular control 9. Hypertension: fair control at present. Avoid overtreating  -  Imdur 30 mg daily, Toprol-XL 50 mg daily, Ranexa 500 mg twice a day, Norvasc 5 mg twice a day, lisinopril 40 mg twice a day 10. Diabetes mellitus with peripheral neuropathy. Hemoglobin A1c 5.2.SSI. Check blood sugars before meals and at bedtime. Patient on Amaryl 2 mg twice daily. Resume gradually as po intake better 11. Hyperlipidemia. Fenofibrate 160 mg daily,Zetia 10 mg daily. 12. Constipation. Laxative assistance 13? CKD vs. AKI: Cr slightly improved today 14. Urinary urgency:   -recollect urine for uaucx  -check pvr's   -oob to void  LOS (Days) 1 A FACE TO FACE EVALUATION WAS PERFORMED  SWARTZ,ZACHARY T 09/27/2016 9:14 AM

## 2016-09-28 ENCOUNTER — Inpatient Hospital Stay (HOSPITAL_COMMUNITY): Payer: Medicare Other

## 2016-09-28 ENCOUNTER — Inpatient Hospital Stay (HOSPITAL_COMMUNITY): Payer: Medicare Other | Admitting: Occupational Therapy

## 2016-09-28 ENCOUNTER — Inpatient Hospital Stay (HOSPITAL_COMMUNITY): Payer: Medicare Other | Admitting: Physical Therapy

## 2016-09-28 LAB — GLUCOSE, CAPILLARY
GLUCOSE-CAPILLARY: 213 mg/dL — AB (ref 65–99)
GLUCOSE-CAPILLARY: 195 mg/dL — AB (ref 65–99)
Glucose-Capillary: 192 mg/dL — ABNORMAL HIGH (ref 65–99)
Glucose-Capillary: 220 mg/dL — ABNORMAL HIGH (ref 65–99)

## 2016-09-28 MED ORDER — AMOXICILLIN 250 MG PO CAPS
250.0000 mg | ORAL_CAPSULE | Freq: Two times a day (BID) | ORAL | Status: AC
  Administered 2016-09-28 – 2016-10-04 (×14): 250 mg via ORAL
  Filled 2016-09-28 (×14): qty 1

## 2016-09-28 MED ORDER — ISOSORBIDE MONONITRATE ER 30 MG PO TB24
30.0000 mg | ORAL_TABLET | Freq: Once | ORAL | Status: AC
  Administered 2016-09-28: 30 mg via ORAL
  Filled 2016-09-28: qty 1

## 2016-09-28 MED ORDER — ISOSORBIDE MONONITRATE ER 30 MG PO TB24
60.0000 mg | ORAL_TABLET | Freq: Every day | ORAL | Status: DC
  Administered 2016-09-29 – 2016-10-11 (×13): 60 mg via ORAL
  Filled 2016-09-28 (×14): qty 2

## 2016-09-28 NOTE — Progress Notes (Signed)
Physical Therapy Session Note  Patient Details  Name: Charlene Greene MRN: [id number] Date of Birth: 1922/07/31  Today's Date: 09/28/2016 PT Individual Time: 0830-1000 and 1400-1429 PT Individual Time Calculation (min): 90 min and 29 min   Short Term Goals: Week 1:  PT Short Term Goal 1 (Week 1): Pt will perform bed mobility on flat bed with supervision PT Short Term Goal 2 (Week 1): Pt will perform sit <> stand and stand pivot transfers from w/c, bed or recliner with RW and min A PT Short Term Goal 3 (Week 1): Pt will perform gait x 100' with RW and min A in 8-10 minutes PT Short Term Goal 4 (Week 1): Pt will perform stair negotiation up/down 4 stairs x 2 reps with bilat UE support on rails and min A  Skilled Therapeutic Interventions/Progress Updates:  Pt received in recliner with son present and RN present assessing vitals.  RN reporting elevated BP this am.  Pt also noted to be on 02 today due to low saturation levels last night.  While RN performed assessment of patient therapist discussed with pt and son pt's ILF set up, PLOF, AD used PTA and D/C plan.  Pt requesting to use bathroom; required 3 attempts to perform sit > stand from low recliner with verbal cues and facilitation of full translation of COG over BOS and cues for initiation of LE extension during sit > stand with max A.  Performed ambulation to/from toilet with RW x 25' with extra time and mod A for safe negotiation around obstacles, changing direction with RW, for upright posture and to maintain safe distance to RW with full step/stride length and foot clearance.  Pt able to perform hygiene in standing with min A for balance but required assistance to don clean brief.  Performed hand hygiene at sink with min A for balance and verbal cues for use of LUE to reach for soap and water controls.  In gym performed dynamic standing balance, weight shift and foot clearance training in standing with alternating foot taps to 6" step with UE  support on rails; attempted to perform sustained hold of partial single limb stance but pt too fearful to release rails.  Performed gait over compliant surface with RW and mod A with continued verbal and tactile cues for upright gaze, posture, safe distance to RW and full step/stride length and foot clearance.  Performed sit <> stand sequence training with focus on full anterior weight shift and activation of LE extensors.  At end of session pt returned to room to rest in w/c before OT session; pt BP in gym during therapy 180/98, 72 bpm-RN notified of continued HTN, pt asymptomatic.  PM session: pt received in recliner with another son and daughter in law present; family reporting that RN reported that CXR was negative but UA was positive for UTI.  RN reporting that therapy can now attempt to wean pt off of 02 and that BP was lower this pm.  02 removed and pt performed sit > stand from recliner in one attempt this afternoon but still required max A to bring COG over BOS and lifting assistance to full stand.  Pt performed ambulation to w/c with RW and mod A.  Performed stand pivot transfers w/c <> toilet with grab bar and mod-max A.  Pt stood with min-mod a to perform hygiene and change into clean brief.  In hallway continued to focus on lateral weight shifting, upright posture and full step length and foot clearance with  lateral stepping to L and R x 20' each direction with bilat UE support on wall rail.  At end of session pt left in w/c due to make up OT session after PT session.    Therapy Documentation Precautions:  Precautions Precautions: Fall Restrictions Weight Bearing Restrictions: No  See Function Navigator for Current Functional Status.   Therapy/Group: Individual Therapy  Edman CircleHall, Audra Faucette 09/28/2016, 12:45 PM

## 2016-09-28 NOTE — Progress Notes (Signed)
Charlene Greene PHYSICAL MEDICINE & REHABILITATION     PROGRESS NOTE    Subjective/Complaints:  Slept better. Still with urinary urgency. NO PVR'S RECORDED  ROS: Pt denies fever, rash/itching, headache, blurred or double vision, nausea, vomiting, abdominal pain, diarrhea, chest pain, shortness of breath, palpitations, dysuria, dizziness, neck pain, back pain, bleeding, anxiety, or depression   Objective: Vital Signs: Blood pressure (!) 190/92, pulse 77, temperature 98.3 F (36.8 C), temperature source Oral, resp. rate 18, height 5\' 2"  (1.575 m), weight 82.4 kg (181 lb 9.6 oz), SpO2 (!) 87 %. No results found.  Recent Labs  09/26/16 2033 09/27/16 0701  WBC 7.0 7.2  HGB 13.5 14.4  HCT 40.2 43.4  PLT 211 208    Recent Labs  09/26/16 2033 09/27/16 0701  NA  --  140  K  --  4.3  CL  --  100*  GLUCOSE  --  171*  BUN  --  18  CREATININE 1.50* 1.23*  CALCIUM  --  9.2   CBG (last 3)   Recent Labs  09/27/16 1647 09/27/16 2100 09/28/16 0640  GLUCAP 194* 207* 192*    Wt Readings from Last 3 Encounters:  09/26/16 82.4 kg (181 lb 9.6 oz)  09/23/16 83 kg (183 lb)    Physical Exam:  Constitutional: She is oriented to person, place, and time. She appears well-developed and well-nourished.  HENT:  Head: NCAT  Eyes: EOMI, anicteric.  Neck: Normal range of motion. Neck supple.  Cardiovascular: RRR, no jvd   Respiratory: clear bilat. Shallow respirations GI: Soft. NT  Musculoskeletal: She exhibits no tenderness.  Neurological: She is alert and oriented to person, place, and time.  Follows commands.  Fair awareness of deficits. Motor: RUE/RLE: 4+/5 throughout LUE: Apraxia 4-/5 proxim al to distal, +PD LLE: HF, KE 3/5, ADF/PF 2 to 3/5 Sensation intact to light touch bilaterally DTRs symmetric   Skin: Skin is warm and dry.  Psychiatric: fair insight and awareness.   Assessment/Plan: 1. Functional deficits secondary to right basal ganglia infarct which require 3+ hours  per day of interdisciplinary therapy in a comprehensive inpatient rehab setting. Physiatrist is providing close team supervision and 24 hour management of active medical problems listed below. Physiatrist and rehab team continue to assess barriers to discharge/monitor patient progress toward functional and medical goals.  Function:  Bathing Bathing position   Position: Shower  Bathing parts Body parts bathed by patient: Right arm, Left arm, Chest, Abdomen, Front perineal area, Right upper leg, Left upper leg Body parts bathed by helper: Buttocks, Right lower leg, Left lower leg, Back  Bathing assist Assist Level:  (mod A)      Upper Body Dressing/Undressing Upper body dressing   What is the patient wearing?: Bra, Pull over shirt/dress Bra - Perfomed by patient: Thread/unthread right bra strap, Thread/unthread left bra strap Bra - Perfomed by helper: Hook/unhook bra (pull down sports bra) Pull over shirt/dress - Perfomed by patient: Thread/unthread right sleeve, Thread/unthread left sleeve Pull over shirt/dress - Perfomed by helper: Put head through opening, Pull shirt over trunk        Upper body assist Assist Level:  (mod A)      Lower Body Dressing/Undressing Lower body dressing   What is the patient wearing?: Underwear, Pants, Non-skid slipper socks Underwear - Performed by patient: Thread/unthread right underwear leg, Thread/unthread left underwear leg Underwear - Performed by helper: Pull underwear up/down Pants- Performed by patient: Thread/unthread right pants leg Pants- Performed by helper: Thread/unthread left pants  leg, Pull pants up/down Non-skid slipper socks- Performed by patient: Don/doff right sock Non-skid slipper socks- Performed by helper: Don/doff left sock                  Lower body assist Assist for lower body dressing:  (mod A)      Toileting Toileting     Toileting steps completed by helper: Adjust clothing prior to toileting, Performs  perineal hygiene, Adjust clothing after toileting Toileting Assistive Devices: Grab bar or rail  Toileting assist Assist level: Touching or steadying assistance (Pt.75%)   Transfers Chair/bed transfer   Chair/bed transfer method: Stand pivot Chair/bed transfer assist level: Moderate assist (Pt 50 - 74%/lift or lower) Chair/bed transfer assistive device: Armrests, Patent attorneyWalker     Locomotion Ambulation     Max distance: 1455ft  Assist level: Touching or steadying assistance (Pt > 75%)   Wheelchair   Type: Manual Max wheelchair distance: 9225ft  Assist Level: Touching or steadying assistance (Pt > 75%)  Cognition Comprehension Comprehension assist level: Understands complex 90% of the time/cues 10% of the time  Expression Expression assist level: Expresses complex 90% of the time/cues < 10% of the time  Social Interaction Social Interaction assist level: Interacts appropriately 90% of the time - Needs monitoring or encouragement for participation or interaction.  Problem Solving Problem solving assist level: Solves complex 90% of the time/cues < 10% of the time  Memory Memory assist level: Recognizes or recalls 75 - 89% of the time/requires cueing 10 - 24% of the time    Medical Problem List and Plan: 1.  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  DVT Prophylaxis/Anticoagulation: Subcutaneous Lovenox. Monitor platelet counts and any signs of bleeding 3. Pain Management: Lyrica 75 mg twice a day--controlled at present 4. Mood: BuSpar 5 mg twice a day, melatonin 3 mg daily at bedtime resumed for sleep  -in good spirits at present 5. Neuropsych: This patient is capable of making decisions on her own behalf. 6. Skin/Wound Care: loca care 7. Fluids/Electrolytes/Nutrition: encouragePO.  8. Sick sinus syndrome with pacemaker. Cardiac regular control 9. Hypertension: needs better control  -  Imdur 30 mg daily--INCREASE  TO 60MG  DAILY, Toprol-XL 50 mg daily, Ranexa 500 mg twice a day, Norvasc 5 mg twice a day, lisinopril 40 mg twice a day 10. Diabetes mellitus with peripheral neuropathy. Hemoglobin A1c 5.2.SSI. Check blood sugars before meals and at bedtime. Patient on Amaryl 2 mg twice daily. Resume gradually as po intake better 11. Hyperlipidemia. Fenofibrate 160 mg daily,Zetia 10 mg daily. 12. Constipation. Laxative assistance 13? CKD vs. AKI: Cr slightly improved today 14. Urinary urgency:   -UA+  -begin empiric amoxil  -check pvr's (not done)_  -oob to void  LOS (Days) 2 A FACE TO FACE EVALUATION WAS PERFORMED  Charlene Greene T 09/28/2016 8:47 AM

## 2016-09-28 NOTE — Progress Notes (Signed)
Occupational Therapy Session Note  Patient Details  Name: Charlene Greene MRN: [id number] Date of Birth: [birth year]  Today's Date: 09/28/2016 OT Individual Time: [id number] OT Individual Time Calculation (min): 63 min  12 minutes missed   Short Term Goals: Week 1:  OT Short Term Goal 1 (Week 1): Pt will engaged in 10 minutes of functional activity with less than 2 rest breaks secondary to fatigue. OT Short Term Goal 2 (Week 1): Pt will perform LB dressing with min A in order to increase I with self care. OT Short Term Goal 3 (Week 1): Pt will perform toileting with min A in order to increase I with self care. OT Short Term Goal 4 (Week 1): Pt will perform UB dressing with set up A in order to increase I with LB self care.  Skilled Therapeutic Interventions/Progress Updates:   Pt was seen for scheduled therapy with bed absent due to xray. OT returned around lunchtime to check on pt with pt reporting that she felt well and was agreeable to reschedule tx after lunch. When skilled OT arrived, pt was agreeable to getting dressed w/c level at sink with setup. Tx focus was ADL retraining and endurance. Trialed adaptive overhead technique for donning bra with min success. Questioning cues provided throughout for encouraging self sequencing. Extra time required for pt to complete at max level of independence, which was Mod A overall. Afterwards pt was taken to dayroom to participate in meaningful word search activity in standing. Mod A sit<stand. Tx focus to increase standing endurance, memory, and psychosocial wellness. At end of session pt was returned to room and left with all needs within reach.   Therapy Documentation Precautions:  Precautions Precautions: Fall Restrictions Weight Bearing Restrictions: No General: General OT Amount of Missed Time: 12 Minutes Vital Signs:  Pain: No c/o pain during session    ADL:   See Function Navigator for Current Functional Status.   Therapy/Group:  Individual Therapy  [name] A Patrecia Veiga 09/28/2016, 7:07 PM

## 2016-09-29 ENCOUNTER — Inpatient Hospital Stay (HOSPITAL_COMMUNITY): Payer: Medicare Other | Admitting: *Deleted

## 2016-09-29 ENCOUNTER — Inpatient Hospital Stay (HOSPITAL_COMMUNITY): Payer: Medicare Other | Admitting: Occupational Therapy

## 2016-09-29 ENCOUNTER — Inpatient Hospital Stay (HOSPITAL_COMMUNITY): Payer: Medicare Other | Admitting: Physical Therapy

## 2016-09-29 DIAGNOSIS — A499 Bacterial infection, unspecified: Secondary | ICD-10-CM

## 2016-09-29 DIAGNOSIS — E1165 Type 2 diabetes mellitus with hyperglycemia: Secondary | ICD-10-CM

## 2016-09-29 DIAGNOSIS — N390 Urinary tract infection, site not specified: Secondary | ICD-10-CM

## 2016-09-29 LAB — URINE CULTURE: Culture: >=100000 — AB

## 2016-09-29 LAB — GLUCOSE, CAPILLARY
GLUCOSE-CAPILLARY: 233 mg/dL — AB (ref 65–99)
Glucose-Capillary: 203 mg/dL — ABNORMAL HIGH (ref 65–99)
Glucose-Capillary: 239 mg/dL — ABNORMAL HIGH (ref 65–99)
Glucose-Capillary: 200 mg/dL — ABNORMAL HIGH (ref 65–99)

## 2016-09-29 MED ORDER — GLIMEPIRIDE 2 MG PO TABS
1.0000 mg | ORAL_TABLET | Freq: Every day | ORAL | Status: DC
  Administered 2016-09-29: 1 mg via ORAL
  Filled 2016-09-29: qty 1

## 2016-09-29 MED ORDER — METOPROLOL SUCCINATE ER 50 MG PO TB24
100.0000 mg | ORAL_TABLET | Freq: Every day | ORAL | Status: DC
  Administered 2016-09-29 – 2016-10-10 (×12): 100 mg via ORAL
  Filled 2016-09-29 (×12): qty 2

## 2016-09-29 MED ORDER — ENOXAPARIN SODIUM 30 MG/0.3ML ~~LOC~~ SOLN
30.0000 mg | SUBCUTANEOUS | Status: DC
  Administered 2016-09-29 – 2016-10-10 (×12): 30 mg via SUBCUTANEOUS
  Filled 2016-09-29 (×12): qty 0.3

## 2016-09-29 MED ORDER — CLONIDINE HCL 0.1 MG PO TABS
0.1000 mg | ORAL_TABLET | Freq: Three times a day (TID) | ORAL | Status: DC | PRN
  Administered 2016-10-01: 0.1 mg via ORAL
  Filled 2016-09-29: qty 1

## 2016-09-29 NOTE — Progress Notes (Signed)
Occupational Therapy Session Note  Patient Details  Name: Charlene Greene MRN: [id number] Date of Birth: 10-11-1922  Today's Date: 09/29/2016 OT Individual Time:  - 4010-2725- 0917-1015 Individual Treatment Time: 58 min      Short Term Goals: Week 1:  OT Short Term Goal 1 (Week 1): Pt will engaged in 10 minutes of functional activity with less than 2 rest breaks secondary to fatigue. OT Short Term Goal 2 (Week 1): Pt will perform LB dressing with min A in order to increase I with self care. OT Short Term Goal 3 (Week 1): Pt will perform toileting with min A in order to increase I with self care. OT Short Term Goal 4 (Week 1): Pt will perform UB dressing with set up A in order to increase I with LB self care.    Skilled Therapeutic Interventions/Progress Updates:   Pt participated in skilled OT session focusing on ADL retraining. No c/o pain. Mod A sit<stand and stand pivot transfer to w/c from recliner at start of tx. Max cues for proper hand placement during power up due to pt tendency to pull up.  ADLs completed w/c level at sink with setup. Min cues for safety and sequencing throughout. Pt still exhibits difficultly with lifting pants over hips and fastening bra. Pt was able to don overhead shirt with Min A today! BP was assessed per nursing request with report of having high BP this morning. BP stable at end of session, 142/83 with nursing notified. Pt was left in w/c at end of tx with son present.   Therapy Documentation Precautions:  Precautions Precautions: Fall Restrictions Weight Bearing Restrictions: No   Pain: No c/o pain during session    ADL:      See Function Navigator for Current Functional Status.   Therapy/Group: Individual Therapy  Charlene Greene A Megyn Leng 09/29/2016, 7:26 PM

## 2016-09-29 NOTE — Progress Notes (Signed)
Physical Therapy Session Note  Patient Details  Name: Charlene Greene MRN: [id number] Date of Birth: 03-07-1922  Today's Date: 09/29/2016 PT Individual Time: 1322-1330 and 1404-15:04  PT Individual Time Calculation (min): 8 min and 60min   Short Term Goals:Week 1:  PT Short Term Goal 1 (Week 1): Pt will perform bed mobility on flat bed with supervision PT Short Term Goal 2 (Week 1): Pt will perform sit <> stand and stand pivot transfers from w/c, bed or recliner with RW and min A PT Short Term Goal 3 (Week 1): Pt will perform gait x 100' with RW and min A in 8-10 minutes PT Short Term Goal 4 (Week 1): Pt will perform stair negotiation up/down 4 stairs x 2 reps with bilat UE support on rails and min A  Skilled Therapeutic Interventions/Progress Updates:  First tx focused on therex for strengthening. Pt eating lunch upopn arrival, so offered to return later. Pt engaged in seated and standing therex including each of the following bil x10 with demo and cues fo rtechnique: marching, LAQ with 5 sec hold, marchign in standing, andkankle pumps.  Sit<>stand x2 with mod A and cues for COG over BOS with using momentum to rise.   Second tx focused on functional mobility training, stairs, gait with RW, and NMR via forced use, manual facilitation, and multi-modal cues. Family present. Pt needing to use toilet and had continent void. Pt performed multiple sit<>stand and stand-step transfers with min/Mod A depending on UE support.  Gait with RW in room and controlled settings 2x25' with min-guard A and postural cues, slow pace.   Therex and balance training in // bars including each of the following:  Sit<>stands, mini-squats, heel raises, hip ABD bil x10 Seated passive HS and heel cord stretching 2x1 min each during rest Standing reaching to horseshoe in/outside BOS x10 each. Pt performed static standing balance 2x2 min during functional tasks as well.   Cough noted, so pt instructed in incentive  spirometry 2x10 up to 750mL  Stair trainging x8 with bil rails forward ascending and sideways descending with min A for steadying. Pt left up in recliner with family and all needs in reach.      Therapy Documentation Precautions:  Precautions Precautions: Fall Restrictions Weight Bearing Restrictions: No   Pain: none, except unrated "a little sore in my arms and legs" - requested RN for K-pad    See Function Navigator for Current Functional Status.   Therapy/Group: Individual Therapy  La Dibella Virl CageyM  Judieth Mckown, PT, DPT  09/29/2016, 2:29 PM

## 2016-09-29 NOTE — Progress Notes (Signed)
[place] PHYSICAL MEDICINE & REHABILITATION     PROGRESS NOTE    Subjective/Complaints:  Had a better night. Still not as good as her sleep at home, but improved. Doesn't like bed  ROS: Pt denies fever, rash/itching, headache, blurred or double vision, nausea, vomiting, abdominal pain, diarrhea, chest pain, shortness of breath, palpitations, dysuria, dizziness, neck pain, back pain, bleeding, anxiety, or depression    Objective: Vital Signs: Blood pressure (!) 185/102, pulse 79, temperature 98.8 F (37.1 C), temperature source Oral, resp. rate 16, height 5\' 2"  (1.575 m), weight 82.4 kg (181 lb 9.6 oz), SpO2 90 %. Dg Chest 2 View  Result Date: 09/28/2016 CLINICAL DATA:  Short of breath EXAM: CHEST  2 VIEW COMPARISON:  09/23/2016 FINDINGS: Cardiac enlargement. Dual lead pacemaker unchanged. Negative for heart failure. Negative for pneumonia or effusion. Lungs remain clear. IMPRESSION: No active cardiopulmonary disease. Electronically Signed   By: Marlan Palauharles  Clark M.D.   On: 09/28/2016 11:11    Recent Labs  09/26/16 2033 09/27/16 0701  WBC 7.0 7.2  HGB 13.5 14.4  HCT 40.2 43.4  PLT 211 208    Recent Labs  09/26/16 2033 09/27/16 0701  NA  --  140  K  --  4.3  CL  --  100*  GLUCOSE  --  171*  BUN  --  18  CREATININE 1.50* 1.23*  CALCIUM  --  9.2   CBG (last 3)   Recent Labs  09/28/16 1704 09/28/16 2131 09/29/16 0706  GLUCAP 213* 195* 203*    Wt Readings from Last 3 Encounters:  09/26/16 82.4 kg (181 lb 9.6 oz)  09/23/16 83 kg (183 lb)    Physical Exam:  Constitutional: no distress  HENT:  Head: ncat Eyes: PERRL.  Neck: Normal range of motion. Neck supple.  Cardiovascular: RRR  Respiratory: clear bilaterally, still not taking deep breaths GI: BS+ NT  Musculoskeletal: She exhibits no tenderness.  Neurological: She is alert and oriented to person, place, and time.  Good attention and awareness  . Motor: RUE/RLE: 4+/5 throughout LUE: Apraxia 4-/5  proxim al to distal, +PD LLE: HF, KE 3/5, ADF/PF 2 to 3/5 Sensation intact to light touch bilaterally DTRs symmetric   Skin: Skin is warm and dry.  Psychiatric: pleasant and coopeartive   Assessment/Plan: 1. Functional deficits secondary to right basal ganglia infarct which require 3+ hours per day of interdisciplinary therapy in a comprehensive inpatient rehab setting. Physiatrist is providing close team supervision and 24 hour management of active medical problems listed below. Physiatrist and rehab team continue to assess barriers to discharge/monitor patient progress toward functional and medical goals.  Function:  Bathing Bathing position   Position: Shower  Bathing parts Body parts bathed by patient: Right arm, Left arm, Chest, Abdomen, Front perineal area, Right upper leg, Left upper leg Body parts bathed by helper: Buttocks, Right lower leg, Left lower leg, Back  Bathing assist Assist Level:  (mod A)      Upper Body Dressing/Undressing Upper body dressing   What is the patient wearing?: Bra, Pull over shirt/dress Bra - Perfomed by patient: Thread/unthread right bra strap, Thread/unthread left bra strap Bra - Perfomed by helper: Hook/unhook bra (pull down sports bra), Thread/unthread right bra strap Pull over shirt/dress - Perfomed by patient: Thread/unthread right sleeve, Thread/unthread left sleeve Pull over shirt/dress - Perfomed by helper: Put head through opening, Pull shirt over trunk        Upper body assist Assist Level:  (Mod A)  Lower Body Dressing/Undressing Lower body dressing   What is the patient wearing?: Pants, Ted Hose, Shoes Underwear - Performed by patient: Thread/unthread right underwear leg, Thread/unthread left underwear leg Underwear - Performed by helper: Pull underwear up/down Pants- Performed by patient: Thread/unthread right pants leg, Thread/unthread left pants leg Pants- Performed by helper: Pull pants up/down Non-skid slipper socks-  Performed by patient: Don/doff right sock Non-skid slipper socks- Performed by helper: Don/doff left sock     Shoes - Performed by patient: Don/doff right shoe, Don/doff left shoe, Fasten right, Fasten left         TED Hose - Performed by helper: Don/doff right TED hose, Don/doff left TED hose  Lower body assist Assist for lower body dressing:  (Mod A)      Toileting Toileting     Toileting steps completed by helper: Adjust clothing prior to toileting, Performs perineal hygiene, Adjust clothing after toileting Toileting Assistive Devices: Grab bar or rail  Toileting assist Assist level: Touching or steadying assistance (Pt.75%)   Transfers Chair/bed transfer   Chair/bed transfer method: Stand pivot Chair/bed transfer assist level: Maximal assist (Pt 25 - 49%/lift and lower) Chair/bed transfer assistive device: Armrests, Patent attorneyWalker     Locomotion Ambulation     Max distance: 25 Assist level: Moderate assist (Pt 50 - 74%)   Wheelchair   Type: Manual Max wheelchair distance: 4425ft  Assist Level: Touching or steadying assistance (Pt > 75%)  Cognition Comprehension Comprehension assist level: Understands complex 90% of the time/cues 10% of the time  Expression Expression assist level: Expresses complex 90% of the time/cues < 10% of the time  Social Interaction Social Interaction assist level: Interacts appropriately 90% of the time - Needs monitoring or encouragement for participation or interaction.  Problem Solving Problem solving assist level: Solves complex 90% of the time/cues < 10% of the time  Memory Memory assist level: Recognizes or recalls 75 - 89% of the time/requires cueing 10 - 24% of the time    Medical Problem List and Plan: 1.  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  DVT Prophylaxis/Anticoagulation: Subcutaneous Lovenox. Monitor platelet counts and any signs of bleeding 3. Pain  Management: Lyrica 75 mg twice a day--controlled 4. Mood: BuSpar 5 mg twice a day, melatonin 3 mg daily at bedtime resumed for sleep  -in good spirits at present 5. Neuropsych: This patient is capable of making decisions on her own behalf. 6. Skin/Wound Care: loca care 7. Fluids/Electrolytes/Nutrition: encouragePO.  8. Sick sinus syndrome with pacemaker. Cardiac regular control 9. Hypertension: still poorly controlled  -  Imdur 30 mg daily--increased to 60mg  daily  - Toprol-XL 50 mg daily- increase to 100mg  daily  - Ranexa 500 mg twice a day, Norvasc 5 mg twice a day, lisinopril 40 mg twice a day 10. Diabetes mellitus with peripheral neuropathy. Hemoglobin A1c 5.2.SSI. Check blood sugars before meals and at bedtime.   Patient on Amaryl 2 mg twice daily at home--resume at 1mg  daily today 11. Hyperlipidemia. Fenofibrate 160 mg daily,Zetia 10 mg daily. 12. Constipation. Laxative assistance 13? CKD vs. AKI: Cr slightly improved today 14. Urinary urgency:   -UA+, UCX 100k GNR  -continue empiric amoxil  -PVR 141  -oob to void  LOS (Days) 3 A FACE TO FACE EVALUATION WAS PERFORMED  SWARTZ,ZACHARY T 09/29/2016 8:41 AM

## 2016-09-29 NOTE — IPOC Note (Signed)
Overall Plan of Care [facility](IPOC) Patient Details Name: Charlene Greene MRN: [id number] DOB: [birth year]  Admitting Diagnosis: CVA  Hospital Problems: Principal Problem:   Basal ganglia infarction [facility](HCC) Active Problems:   Benign essential HTN   Acute renal insufficiency   Urinary urgency     Functional Problem List: Nursing Bladder, Bowel, Edema, Endurance, Medication Management, Motor, Pain, Safety, Skin Integrity, Sensory  PT Balance, Endurance, Motor, Safety, Sensory, Skin Integrity  OT Balance, Endurance, Motor, Pain, Safety  SLP    TR         Basic ADL's: OT Grooming, Bathing, Dressing, Toileting     Advanced  ADL's: OT Laundry     Transfers: PT Bed Mobility, Bed to Chair, Car, State Street CorporationFurniture, Civil Service fast [place]  OT Toilet, Research scientist (life sciences)Tub/Shower     Locomotion: PT Ambulation, Psychologist, prison and probation servicesWheelchair Mobility, Stairs     Additional Impairments: OT None  SLP        TR      Anticipated Outcomes Item Anticipated Outcome  Self Feeding n/a  Swallowing      Basic self-care  mod I   Toileting  mod I    Bathroom Transfers mod I - toilet, supervision -shower  Bowel/Bladder  min assist  Transfers  Mod I with LRAD   Locomotion  mod I for house hold distances. with LRAD   Communication     Cognition     Pain  pain less than or equal to 2  Safety/Judgment  no unsafe behavior   Therapy Plan: PT Intensity: Minimum of 1-2 x/day ,45 to 90 minutes PT Frequency: 5 out of 7 days PT Duration Estimated Length of Stay: 10-14 days OT Intensity: Minimum of 1-2 x/day, 45 to 90 minutes OT Frequency: 5 out of 7 days OT Duration/Estimated Length of Stay: 10-14 days         Team Interventions: Nursing Interventions Patient/Family Education, Pain Management, Bladder Management, Bowel Management, Skin Care/Wound Management, Medication Management, Discharge Planning  PT interventions Ambulation/gait training, Balance/vestibular training, Community reintegration, Discharge planning, DME/adaptive equipment  instruction, Functional mobility training, Neuromuscular re-education, Pain management, Patient/family education, Psychosocial support, Skin care/wound management, Splinting/orthotics, Stair training, Therapeutic Activities, Therapeutic Exercise, UE/LE Strength taining/ROM, UE/LE Coordination activities, Visual/perceptual remediation/compensation, Wheelchair propulsion/positioning, Functional electrical stimulation  OT Interventions Warden/rangerBalance/vestibular training, FirefighterCommunity reintegration, Discharge planning, Functional mobility training, Psychosocial support, Therapeutic Activities, UE/LE Coordination activities, Patient/family education, UE/LE Strength taining/ROM, DME/adaptive equipment instruction, Neuromuscular re-education, Self Care/advanced ADL retraining, Therapeutic Exercise, Wheelchair propulsion/positioning  SLP Interventions    TR Interventions    SW/CM Interventions Discharge Planning, Psychosocial Support, Patient/Family Education    Team Discharge Planning: Destination: PT-Home ,OT- Assisted Living , SLP-  Projected Follow-up: PT-Home health PT, OT-  Home health OT, SLP-  Projected Equipment Needs: PT-To be determined, OT- To be determined, SLP-  Equipment Details: PT- , OT-  Patient/family involved in discharge planning: PT- Patient,  OT-Patient, SLP-   MD ELOS: 10-14 days Medical Rehab Prognosis:  Excellent Assessment: The patient has been admitted for CIR therapies with the diagnosis of CVA. The team will be addressing functional mobility, strength, stamina, balance, safety, adaptive techniques and equipment, self-care, bowel and bladder mgt, patient and caregiver education, NMR, visual-spatial awareness, community reintegration, activity tolerace. Goals have been set at mod I for basic mobility and self-care tasks.    Ranelle OysterZachary T. Sugar Vanzandt, MD, FAAPMR      See Team Conference Notes for weekly updates to the plan of care

## 2016-09-30 LAB — GLUCOSE, CAPILLARY
GLUCOSE-CAPILLARY: 253 mg/dL — AB (ref 65–99)
GLUCOSE-CAPILLARY: 136 mg/dL — AB (ref 65–99)
Glucose-Capillary: 177 mg/dL — ABNORMAL HIGH (ref 65–99)
Glucose-Capillary: 315 mg/dL — ABNORMAL HIGH (ref 65–99)

## 2016-09-30 MED ORDER — GLIMEPIRIDE 1 MG PO TABS
1.0000 mg | ORAL_TABLET | Freq: Two times a day (BID) | ORAL | Status: DC
  Administered 2016-09-30 – 2016-10-05 (×11): 1 mg via ORAL
  Filled 2016-09-30 (×12): qty 1

## 2016-09-30 NOTE — Progress Notes (Addendum)
Occupational Therapy Session Note  Patient Details  Name: Charlene Greene MRN: [id number] Date of Birth: [birth year]  Today's Date: 09/29/2016 OT Individual Time: 1140-1210 OT Individual Time Calculation (min): 30 min     Short Term Goals: Week 1:  OT Short Term Goal 1 (Week 1): Pt will engaged in 10 minutes of functional activity with less than 2 rest breaks secondary to fatigue. OT Short Term Goal 2 (Week 1): Pt will perform LB dressing with min A in order to increase I with self care. OT Short Term Goal 3 (Week 1): Pt will perform toileting with min A in order to increase I with self care. OT Short Term Goal 4 (Week 1): Pt will perform UB dressing with set up A in order to increase I with LB self care.  Skilled Therapeutic Interventions/Progress Updates:   Patient participated in bilateral upper extremity ROM and neuro reeducation to left UE this session  Therapy Documentation Precautions:  Precautions Precautions: Fall Restrictions Weight Bearing Restrictions: No  Pain:denied    See Function Navigator for Current Functional Status.   Therapy/Group: Individual Therapy  Bud Faceickett, Yanis Larin [facility] 09/30/2016, 8:46 AM

## 2016-09-30 NOTE — Progress Notes (Signed)
Social Work Social Work Assessment and Plan  Patient Details  Name: Charlene Greene MRN: [id number] Date of Birth: Oct 20, 1922  Today's Date: 09/28/2016  Problem List:  Patient Active Problem List   Diagnosis Date Noted  . Acute renal insufficiency 09/27/2016  . Urinary urgency 09/27/2016  . Basal ganglia infarction (HCC) 09/26/2016  . Neuropathic pain   . Benign essential HTN   . Slow transit constipation   . Elevated creatine kinase   . Accelerated hypertension 09/24/2016  . Diabetes mellitus (HCC) 09/24/2016  . Hyperlipidemia 09/24/2016  . Sick sinus syndrome (HCC) 09/24/2016  . CVA (cerebral vascular accident) (HCC) 09/23/2016  . Stroke (cerebrum) (HCC) 09/23/2016   Past Medical History:  Past Medical History:  Diagnosis Date  . Diabetes mellitus without complication (HCC)   . Hypertension   . Pacemaker    Past Surgical History:  Past Surgical History:  Procedure Laterality Date  . PACEMAKER PLACEMENT     Social History:  reports that she has never smoked. She has never used smokeless tobacco. She reports that she does not drink alcohol. Her drug history is not on file.  Family / Support Systems Marital Status: Widow/Widower How Long?: 2016 Patient Roles: Parent Children: Delfin GantSon, Gary Aldridge @ [phone];  son, Jimmye NormanRon Aldredge @ [phone] Other Supports: daughters-in-law Anticipated Caregiver: Sons and daughter-in-law Ability/Limitations of Caregiver: They work; patient lived alone PTA Caregiver Availability: Intermittent Family Dynamics: Family very attentive and supportive to patient.  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  She denies any emotional distress about her situation.  As noted, family is very supportive and describes her as "very motivated". Recent Psychosocial Issues: Husband died ~ 2 yrs ago Pyschiatric History: None Substance Abuse History: None  Patient / Family Perceptions, Expectations & Goals Pt/Family understanding of illness & functional limitations: Pt and family with good understanding of her CVA and current functional limitations/ need for CIR. Premorbid pt/family roles/activities: Pt completely [place] in [place] apt [place] [address]. Anticipated changes in roles/activities/participation: little change anticipated as pt with mod independent goals Pt/family expectations/goals: Pt eager to return to her apartment.  Community Resources Levi StraussCommunity Agencies: None Premorbid Home Care/DME Agencies: None Transportation available at discharge: yes  Discharge Planning Living Arrangements: Alone Support Systems: Children, Friends/neighbors, Home care staff Type of Residence: Other (Comment) ([place] [address] which is a continuing care community.) Insurance Resources: Harrah[employer] EntertainmentMedicare, Media plannerrivate Insurance (specify) Psychologist, clinical(GEHA) Financial Resources: Restaurant manager, fast foodocial Security Financial Screen Referred: No Living Expenses: Own Money Management: Patient Does the patient have any problems obtaining your medications?: No Home Management: pt Patient/Family Preliminary Plans: Pt plans to return to her [place] apt Social Work Anticipated Follow Up Needs: HH/OP Expected length of stay: 10-14 days  Clinical Impression Pleasant, elderly woman here  following a CVA but making excellent progress toward mod ind goals.  Family at bedside (son and dtr-in-law) and very supportive.  Pt lives in an independent apartment and full intends  to return there after CIR.  She has the option to add support services there as indicated.  She denies any emotional distress.  Will follow for support and d/c planning needs.  Delonte Musich 09/28/2016, 1:20 PM

## 2016-09-30 NOTE — Progress Notes (Signed)
[facility] PHYSICAL MEDICINE & REHABILITATION     PROGRESS NOTE    Subjective/Complaints:  Slept well again. Didn't get kpad yet.   ROS: Pt denies fever, rash/itching, headache, blurred or double vision, nausea, vomiting, abdominal pain, diarrhea, chest pain, shortness of breath, palpitations, dysuria, dizziness, neck pain, back pain, bleeding, anxiety, or depression     Objective: Vital Signs: Blood pressure (!) 185/79, pulse 74, temperature 98.1 F (36.7 C), temperature source Oral, resp. rate 16, height 5\' 2"  (1.575 m), weight 82.4 kg (181 lb 9.6 oz), SpO2 90 %. Dg Chest 2 View  Result Date: 09/28/2016 CLINICAL DATA:  Short of breath EXAM: CHEST  2 VIEW COMPARISON:  09/23/2016 FINDINGS: Cardiac enlargement. Dual lead pacemaker unchanged. Negative for heart failure. Negative for pneumonia or effusion. Lungs remain clear. IMPRESSION: No active cardiopulmonary disease. Electronically Signed   By: Marlan Palauharles  Clark M.D.   On: 09/28/2016 11:11   No results for input(s): WBC, HGB, HCT, PLT in the last 72 hours. No results for input(s): NA, K, CL, GLUCOSE, BUN, CREATININE, CALCIUM in the last 72 hours.  Invalid input(s): CO CBG (last 3)   Recent Labs  09/29/16 1650 09/29/16 2055 09/30/16 0639  GLUCAP 200* 233* 177*    Wt Readings from Last 3 Encounters:  09/26/16 82.4 kg (181 lb 9.6 oz)  09/23/16 83 kg (183 lb)    Physical Exam:  Constitutional: no distress  HENT:  Head: NCAT Eyes: PERRL.  Neck: Normal range of motion. Neck supple.  Cardiovascular: RRR  Respiratory: cta bilaterally GI: NT,NT  Musculoskeletal: She exhibits no tenderness.  Neurological: She is alert and oriented to person, place, and time.  Good attention and awareness  . Motor: RUE/RLE: 4+/5 throughout LUE: less Apraxia 4-/5 proxim al to distal, +PD LLE: HF, KE 3/5, ADF/PF 2 to 3/5 Sensation intact to light touch bilaterally DTRs symmetric   Skin: Skin  intact  Psychiatric: pleasant and  coopeartive   Assessment/Plan: 1. Functional deficits secondary to right basal ganglia infarct which require 3+ hours per day of interdisciplinary therapy in a comprehensive inpatient rehab setting. Physiatrist is providing close team supervision and 24 hour management of active medical problems listed below. Physiatrist and rehab team continue to assess barriers to discharge/monitor patient progress toward functional and medical goals.  Function:  Bathing Bathing position   Position: Wheelchair/chair at sink  Bathing parts Body parts bathed by patient: Right arm, Left arm, Chest, Abdomen, Front perineal area, Buttocks, Right upper leg, Left upper leg, Right lower leg, Left lower leg, Back Body parts bathed by helper: Buttocks, Right lower leg, Left lower leg, Back  Bathing assist Assist Level: Touching or steadying assistance(Pt > 75%)      Upper Body Dressing/Undressing Upper body dressing   What is the patient wearing?: Pull over shirt/dress Bra - Perfomed by patient: Thread/unthread right bra strap, Thread/unthread left bra strap Bra - Perfomed by helper: Hook/unhook bra (pull down sports bra) Pull over shirt/dress - Perfomed by patient: Thread/unthread right sleeve, Thread/unthread left sleeve, Put head through opening Pull over shirt/dress - Perfomed by helper: Pull shirt over trunk        Upper body assist Assist Level: Touching or steadying assistance(Pt > 75%)      Lower Body Dressing/Undressing Lower body dressing   What is the patient wearing?: Pants, Ted Hose, Shoes Underwear - Performed by patient: Thread/unthread right underwear leg, Thread/unthread left underwear leg Underwear - Performed by helper: Pull underwear up/down Pants- Performed by patient: Thread/unthread right pants  leg, Thread/unthread left pants leg Pants- Performed by helper: Pull pants up/down Non-skid slipper socks- Performed by patient: Don/doff right sock Non-skid slipper socks- Performed by  helper: Don/doff left sock     Shoes - Performed by patient: Don/doff right shoe, Don/doff left shoe, Fasten right, Fasten left         TED Hose - Performed by helper: Don/doff right TED hose, Don/doff left TED hose  Lower body assist Assist for lower body dressing: Touching or steadying assistance (Pt > 75%)      Toileting Toileting   Toileting steps completed by patient: Adjust clothing prior to toileting, Performs perineal hygiene, Adjust clothing after toileting Toileting steps completed by helper: Adjust clothing after toileting Toileting Assistive Devices: Grab bar or rail  Toileting assist Assist level: Touching or steadying assistance (Pt.75%)   Transfers Chair/bed transfer   Chair/bed transfer method: Stand pivot Chair/bed transfer assist level: Moderate assist (Pt 50 - 74%/lift or lower) Chair/bed transfer assistive device: Armrests, Patent attorneyWalker     Locomotion Ambulation     Max distance: 25 Assist level: Moderate assist (Pt 50 - 74%)   Wheelchair   Type: Manual Max wheelchair distance: 6625ft  Assist Level: Touching or steadying assistance (Pt > 75%)  Cognition Comprehension Comprehension assist level: Understands complex 90% of the time/cues 10% of the time  Expression Expression assist level: Expresses complex 90% of the time/cues < 10% of the time  Social Interaction Social Interaction assist level: Interacts appropriately 90% of the time - Needs monitoring or encouragement for participation or interaction.  Problem Solving Problem solving assist level: Solves complex 90% of the time/cues < 10% of the time  Memory Memory assist level: Recognizes or recalls 75 - 89% of the time/requires cueing 10 - 24% of the time    Medical Problem List and Plan: 1.  Left hemiparesis secondary to right basal ganglia infarct secondary to small vessel disease  -continue PT/OT 2.  DVT Prophylaxis/Anticoagulation: Subcutaneous Lovenox. Monitor platelet counts and any signs of  bleeding 3. Pain Management: Lyrica 75 mg twice a day--controlled 4. Mood: BuSpar 5 mg twice a day, melatonin 3 mg daily at bedtime resumed for sleep  -in good spirits at present 5. Neuropsych: This patient is capable of making decisions on her own behalf. 6. Skin/Wound Care: loca care 7. Fluids/Electrolytes/Nutrition: encouragePO.  8. Sick sinus syndrome with pacemaker. Cardiac regular control 9. Hypertension: some improvement with recent changes  -  Imdur 30 mg daily--increased to 60mg  daily 11/17  - Toprol-XL 50 mg daily- increased to 100mg  daily 11/18  - Ranexa 500 mg twice a day, Norvasc 5 mg twice a day, lisinopril 40 mg twice a day 10. Diabetes mellitus with peripheral neuropathy. Hemoglobin A1c 5.2.SSI. Check blood sugars before meals and at bedtime.   Patient on Amaryl 2 mg twice daily at home--resumed at 1mg  daily 11/18---increase to bid today 11. Hyperlipidemia. Fenofibrate 160 mg daily,Zetia 10 mg daily. 12. Constipation. Laxative assistance 13? CKD vs. AKI: Cr slightly improved today 14. Urinary urgency: improved  -UA+, UCX 100k proteus mirabilis  -s to  amoxil  -PVR 141  -oob to void  LOS (Days) 4 A FACE TO FACE EVALUATION WAS PERFORMED  Alexandre Lightsey T 09/30/2016 8:22 AM

## 2016-09-30 NOTE — Progress Notes (Addendum)
Occupational Therapy Session Note  Patient Details  Name: Charlene Greene MRN: [id number] Date of Birth: 1922-05-17  Today's Date: 09/29/2016 OT Individual Time: 1400-1445 OT Individual Time Calculation (min): 45 min     Short Term Goals: Week 1:  OT Short Term Goal 1 (Week 1): Pt will engaged in 10 minutes of functional activity with less than 2 rest breaks secondary to fatigue. OT Short Term Goal 2 (Week 1): Pt will perform LB dressing with min A in order to increase I with self care. OT Short Term Goal 3 (Week 1): Pt will perform toileting with min A in order to increase I with self care. OT Short Term Goal 4 (Week 1): Pt will perform UB dressing with set up A in order to increase I with LB self care.  Skilled Therapeutic Interventions/Progress Updates:   Patient participated in left upper extremity neuor reducation and continued education of her home exercise putty program.   She was able to demonstrate competence on completely the first 9 before session ended.  Therapy Documentation Precautions:  Precautions Precautions: Fall Restrictions Weight Bearing Restrictions: No  Pain:denied  See Function Navigator for Current Functional Status.   Therapy/Group: Individual Therapy  Bud Faceickett, Charlene Win [facility] 09/30/2016, 8:50 AM

## 2016-09-30 NOTE — Care Management Note (Signed)
Inpatient Rehabilitation Center Individual Statement of Services  Patient Name:  Charlene Greene Quant  Date:  09/28/2016  Welcome to the Inpatient Rehabilitation [facility].  Our goal is to provide you with an individualized program based on your diagnosis and situation, designed to meet your specific needs.  With this comprehensive rehabilitation program, you will be expected to participate in at least 3 hours of rehabilitation therapies Monday-Friday, with modified therapy programming on the weekends.  Your rehabilitation program will include the following services:  Physical Therapy (PT), Occupational Therapy (OT), Speech Therapy (ST), 24 hour per day rehabilitation nursing, Therapeutic Recreaction (TR), Neuropsychology, Case Management (Social Worker), Rehabilitation Medicine, Nutrition Services and Pharmacy Services  Weekly team conferences will be held on Wednesdays to discuss your progress.  Your Social Worker will talk with you frequently to get your input and to update you on team discussions.  Team conferences with you and your family in attendance may also be held.  Expected length of stay: 10-12 days  Overall anticipated outcome: modified independent  Depending on your progress and recovery, your program may change. Your Social Worker will coordinate services and will keep you informed of any changes. Your Social Worker's name and contact numbers are listed  below.  The following services may also be recommended but are not provided by the Inpatient Rehabilitation Center:   Driving Evaluations  Home Health Rehabiltiation Services  Outpatient Rehabilitation Services    Arrangements will be made to provide these services after discharge if needed.  Arrangements include referral to agencies that provide these services.  Your insurance has been verified to be:  Medicare and GEHA Your primary doctor is:  Vernona RiegerKatherine Clark  Pertinent information will be shared with your doctor and your  insurance company.  Social Worker:  SalungaLucy Skie Vitrano, [place] [phone] or ([phone]   Information discussed with and copy given to patient by: Amada JupiterHOYLE, Serine Kea, 09/28/2016, 1:23 PM

## 2016-10-01 ENCOUNTER — Inpatient Hospital Stay (HOSPITAL_COMMUNITY): Payer: Medicare Other | Admitting: Occupational Therapy

## 2016-10-01 ENCOUNTER — Inpatient Hospital Stay (HOSPITAL_COMMUNITY): Payer: Medicare Other | Admitting: Physical Therapy

## 2016-10-01 DIAGNOSIS — I169 Hypertensive crisis, unspecified: Secondary | ICD-10-CM

## 2016-10-01 DIAGNOSIS — E1143 Type 2 diabetes mellitus with diabetic autonomic (poly)neuropathy: Secondary | ICD-10-CM

## 2016-10-01 DIAGNOSIS — N183 Chronic kidney disease, stage 3 unspecified: Secondary | ICD-10-CM

## 2016-10-01 DIAGNOSIS — N390 Urinary tract infection, site not specified: Secondary | ICD-10-CM

## 2016-10-01 DIAGNOSIS — I150 Renovascular hypertension: Secondary | ICD-10-CM

## 2016-10-01 LAB — GLUCOSE, CAPILLARY
GLUCOSE-CAPILLARY: 141 mg/dL — AB (ref 65–99)
GLUCOSE-CAPILLARY: 287 mg/dL — AB (ref 65–99)
GLUCOSE-CAPILLARY: 183 mg/dL — AB (ref 65–99)
Glucose-Capillary: 170 mg/dL — ABNORMAL HIGH (ref 65–99)

## 2016-10-01 MED ORDER — AMLODIPINE BESYLATE 10 MG PO TABS
10.0000 mg | ORAL_TABLET | Freq: Two times a day (BID) | ORAL | Status: DC
  Administered 2016-10-01 – 2016-10-11 (×20): 10 mg via ORAL
  Filled 2016-10-01 (×20): qty 1

## 2016-10-01 MED ORDER — HYDRALAZINE HCL 25 MG PO TABS
25.0000 mg | ORAL_TABLET | Freq: Three times a day (TID) | ORAL | Status: DC
  Administered 2016-10-01 – 2016-10-02 (×3): 25 mg via ORAL
  Filled 2016-10-01 (×3): qty 1

## 2016-10-01 NOTE — Progress Notes (Signed)
[facility] PHYSICAL MEDICINE & REHABILITATION     PROGRESS NOTE    Subjective/Complaints: Pt slept better overnight.  She does not have any complaints at present.  ROS: Denies CP, SOB, N/V/D.   Objective: Vital Signs: Blood pressure (!) 184/91, pulse 78, temperature 98.4 F (36.9 C), temperature source Oral, resp. rate 18, height 5\' 2"  (1.575 m), weight 82.4 kg (181 lb 9.6 oz), SpO2 93 %. No results found. No results for input(s): WBC, HGB, HCT, PLT in the last 72 hours. No results for input(s): NA, K, CL, GLUCOSE, BUN, CREATININE, CALCIUM in the last 72 hours.  Invalid input(s): CO CBG (last 3)   Recent Labs  09/30/16 1658 09/30/16 2106 10/01/16 0649  GLUCAP 253* 136* 141*    Wt Readings from Last 3 Encounters:  09/26/16 82.4 kg (181 lb 9.6 oz)  09/23/16 83 kg (183 lb)    Physical Exam:  Constitutional: no distress. Vital signs reviewed.  HENT: [place]. AT Eyes: EOMI. No discharge.  Cardiovascular: RRR. No JVD.  Respiratory: cta bilaterally. Unlabored.  GI: BS+. NT Musculoskeletal: She exhibits no tenderness.  Neurological: She is alert and oriented.  Motor: RUE: 4+/5 proximal to distal RLE: 4/5 proximal to distal LUE: 4/5 proximal to distal  LLE: HF, KE 4-/5 proximal to distal Skin: Skin  intact  Psychiatric: pleasant and coopeartive   Assessment/Plan: 1. Functional deficits secondary to right basal ganglia infarct which require 3+ hours per day of interdisciplinary therapy in a comprehensive inpatient rehab setting. Physiatrist is providing close team supervision and 24 hour management of active medical problems listed below. Physiatrist and rehab team continue to assess barriers to discharge/monitor patient progress toward functional and medical goals.  Function:  Bathing Bathing position   Position: Wheelchair/chair at sink  Bathing parts Body parts bathed by patient: Right arm, Left arm, Chest, Abdomen, Front perineal area, Buttocks, Right upper leg, Left  upper leg, Right lower leg, Left lower leg, Back Body parts bathed by helper: Buttocks, Right lower leg, Left lower leg, Back  Bathing assist Assist Level: Touching or steadying assistance(Pt > 75%)      Upper Body Dressing/Undressing Upper body dressing   What is the patient wearing?: Pull over shirt/dress Bra - Perfomed by patient: Thread/unthread right bra strap, Thread/unthread left bra strap Bra - Perfomed by helper: Hook/unhook bra (pull down sports bra) Pull over shirt/dress - Perfomed by patient: Thread/unthread right sleeve, Thread/unthread left sleeve, Put head through opening Pull over shirt/dress - Perfomed by helper: Pull shirt over trunk        Upper body assist Assist Level: Touching or steadying assistance(Pt > 75%)      Lower Body Dressing/Undressing Lower body dressing   What is the patient wearing?: Pants, Ted Hose, Shoes Underwear - Performed by patient: Thread/unthread right underwear leg, Thread/unthread left underwear leg Underwear - Performed by helper: Pull underwear up/down Pants- Performed by patient: Thread/unthread right pants leg, Thread/unthread left pants leg Pants- Performed by helper: Pull pants up/down Non-skid slipper socks- Performed by patient: Don/doff right sock Non-skid slipper socks- Performed by helper: Don/doff left sock     Shoes - Performed by patient: Don/doff right shoe, Don/doff left shoe, Fasten right, Fasten left         TED Hose - Performed by helper: Don/doff right TED hose, Don/doff left TED hose  Lower body assist Assist for lower body dressing: Touching or steadying assistance (Pt > 75%)      Toileting Toileting   Toileting steps completed by patient:  Adjust clothing prior to toileting, Performs perineal hygiene, Adjust clothing after toileting Toileting steps completed by helper: Adjust clothing after toileting Toileting Assistive Devices: Grab bar or rail  Toileting assist Assist level: Touching or steadying  assistance (Pt.75%)   Transfers Chair/bed transfer   Chair/bed transfer method: Stand pivot Chair/bed transfer assist level: Moderate assist (Pt 50 - 74%/lift or lower) Chair/bed transfer assistive device: Armrests, Patent attorneyWalker     Locomotion Ambulation     Max distance: 25 Assist level: Moderate assist (Pt 50 - 74%)   Wheelchair   Type: Manual Max wheelchair distance: 6525ft  Assist Level: Touching or steadying assistance (Pt > 75%)  Cognition Comprehension Comprehension assist level: Understands complex 90% of the time/cues 10% of the time  Expression Expression assist level: Expresses complex 90% of the time/cues < 10% of the time  Social Interaction Social Interaction assist level: Interacts appropriately 90% of the time - Needs monitoring or encouragement for participation or interaction.  Problem Solving Problem solving assist level: Solves complex 90% of the time/cues < 10% of the time  Memory Memory assist level: Recognizes or recalls 75 - 89% of the time/requires cueing 10 - 24% of the time    Medical Problem List and Plan: 1.  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  DVT Prophylaxis/Anticoagulation: Subcutaneous Lovenox. Monitor platelet counts and any signs of bleeding 3. Pain Management: Lyrica 75 mg twice a day 4. Mood: BuSpar 5 mg twice a day, melatonin 3 mg daily at bedtime resumed for sleep 5. Neuropsych: This patient is capable of making decisions on her own behalf. 6. Skin/Wound Care: loca care 7. Fluids/Electrolytes/Nutrition: encouragePO.  8. Sick sinus syndrome with pacemaker. Cardiac rate control 9. Hypertension with hypertensive crisis:   Imdur increased to 60mg  daily 11/17  Toprol-XL 50 mg daily- increased to 100mg  daily 11/18  Ranexa 500 mg twice a day,   Norvasc 5 mg twice a day, increased 10 BID  Lisinopril 40 mg twice a day   Started Hydralazine 25 TID on 11/20  Renal ultrasound  ordered 11/20 10. Diabetes mellitus with peripheral neuropathy. Hemoglobin A1c 5.2.SSI. Check blood sugars before meals and at bedtime.   Patient on Amaryl 2 mg twice daily at home--increased to 1mg  BID.  Improving with 1 spike, yesterday, will cont to monitor and consider further increase if necessary 11. Hyperlipidemia. Fenofibrate 160 mg daily,Zetia 10 mg daily. 12. Constipation. Laxative assistance 13? CKD vs. AKI:   Cr 1.23 on 11/16  Cont to monitor, labs ordered for tomorrow 14. UTI  -UA+, UCX 100k proteus mirabilis  -amoxil 11/17-11/24  -oob to void  LOS (Days) 5 A FACE TO FACE EVALUATION WAS PERFORMED  Ankit Karis Jubanil Patel 10/01/2016 9:56 AM

## 2016-10-01 NOTE — Progress Notes (Signed)
Occupational Therapy Session Note  Patient Details  Name: Charlene Greene MRN: [id number] Date of Birth: 11-12-1922  Today's Date: 10/01/2016 OT Individual Time: 1500-1530 OT Individual Time Calculation (min): 30 min make up time    Short Term Goals: Week 1:  OT Short Term Goal 1 (Week 1): Pt will engaged in 10 minutes of functional activity with less than 2 rest breaks secondary to fatigue. OT Short Term Goal 2 (Week 1): Pt will perform LB dressing with min A in order to increase I with self care. OT Short Term Goal 3 (Week 1): Pt will perform toileting with min A in order to increase I with self care. OT Short Term Goal 4 (Week 1): Pt will perform UB dressing with set up A in order to increase I with LB self care.  Skilled Therapeutic Interventions/Progress Updates:    Pt seen for 30 mins make up time with focus on activity tolerance and LUE motor control.  Engaged in laundry task in sitting with focus on dynamic sitting balance to retrieve clothes from bag and then utilize BUE to fold clothes.  Pt required increased time due to decreased vision (macular degeneration) and decreased LUE control.  Pt folded all clothing and then therapist placed in drawers due to pt fatigue.  Returned to bed and placed k-pad under Rt shoulder for pain relieft.  Therapy Documentation Precautions:  Precautions Precautions: Fall Restrictions Weight Bearing Restrictions: No General:   Vital Signs: Therapy Vitals Temp: 98.6 F (37 C) Temp Source: Oral Pulse Rate: 73 Resp: 18 BP: (!) 134/58 Patient Position (if appropriate): Lying Oxygen Therapy SpO2: 92 % O2 Device: Not Delivered Pain:  Pt reports pain in Lt shoulder, reapplied heat.  RN aware  See Function Navigator for Current Functional Status.   Therapy/Group: Individual Therapy  Rosalio LoudHOXIE, Verna Desrocher 10/01/2016, 3:34 PM

## 2016-10-01 NOTE — Progress Notes (Signed)
Occupational Therapy Session Note  Patient Details  Name: Charlene Greene MRN: [id number] Date of Birth: [birth year]  Today's Date: 10/01/2016 OT Individual Time: 1101-1200 OT Individual Time Calculation (min): 59 min     Short Term Goals: Week 1:  OT Short Term Goal 1 (Week 1): Pt will engaged in 10 minutes of functional activity with less than 2 rest breaks secondary to fatigue. OT Short Term Goal 2 (Week 1): Pt will perform LB dressing with min A in order to increase I with self care. OT Short Term Goal 3 (Week 1): Pt will perform toileting with min A in order to increase I with self care. OT Short Term Goal 4 (Week 1): Pt will perform UB dressing with set up A in order to increase I with LB self care.  Skilled Therapeutic Interventions/Progress Updates:     Upon entering the room, pt seated in recliner chair with 3/10 c/o pain in L UE described as soreness and has heating pad on. Pt verbalizing, "I need to wash my clothes and take a shower please." Pt performed sit>stand with mod lifting assistance from chair. Pt ambulating 10 ' with min A to wheelchair. OT propelled wheelchair to laundry room. Pt ambulating with laundry bag in hand and use of RW with mod A into laundry room. Pt needing cues for RW advancement, placement, and safety. Pt placed all items into washing machine and returned to wheelchair. OT propelled pt back to room secondary to time management. PT performed stand pivot transfer into shower with use of RW and grab bar and min A. Pt bathing while seated on TTB with encouraged use of LH sponge in order to increase I with LB self care. Pt with increased fatigue at this point and OT assisted her with drying off and donning hospital gown secondary to her not having any clean clothing. Pt ambulating back to recliner chair at end of session with mod A. Call bell and all needed items within reach. Daughter in law also present in room upon exiting.    Therapy Documentation Precautions:   Precautions Precautions: Fall Restrictions Weight Bearing Restrictions: No General:   Vital Signs: Therapy Vitals Pulse Rate: 78 BP: (!) 184/91 Patient Position (if appropriate): Sitting Oxygen Therapy SpO2: 93 % O2 Device: Not Delivered  See Function Navigator for Current Functional Status.   Therapy/Group: Individual Therapy  Alen BleacherBradsher, Hosie Sharman P 10/01/2016, 12:55 PM

## 2016-10-01 NOTE — Progress Notes (Signed)
Physical Therapy Session Note  Patient Details  Name: Charlene Greene MRN: [id number] Date of Birth: 10/05/1922  Today's Date: 10/01/2016 PT Individual Time: 0840-1000 PT Individual Time Calculation (min): 80 min    Short Term Goals: Week 1:  PT Short Term Goal 1 (Week 1): Pt will perform bed mobility on flat bed with supervision PT Short Term Goal 2 (Week 1): Pt will perform sit <> stand and stand pivot transfers from w/c, bed or recliner with RW and min A PT Short Term Goal 3 (Week 1): Pt will perform gait x 100' with RW and min A in 8-10 minutes PT Short Term Goal 4 (Week 1): Pt will perform stair negotiation up/down 4 stairs x 2 reps with bilat UE support on rails and min A  Skilled Therapeutic Interventions/Progress Updates:  Pt received in recliner with RN providing medication; pt noted to have increased coughing when taking medication with water today; pt reporting intermittent coughing with meals-will discuss with PA or MD to determine if pt would benefit from SLP evaluation.  Pt reporting feeling stronger today and no urinary urgency yesterday and today.  Pt reporting soreness in bilat UE but did not wish to request pain medication.  Pt performed sit > stand from recliner with decreased assistance today, mod A and one attempt.  Performed gait training x 100' + 1950' with RW in 14 minutes at very slow gait speed with min-mod A and one seated rest break to assess Sp02 and HR with therapist providing verbal and tactile cues for upright gaze, posture, R shoulder depression, lateral weight shifting and full step length bilaterally.  Pt noted to have improved foot clearance today.  Continued to focus on L NMR, dynamic standing balance, postural control with sit <> stand from elevated mat without UE support and forward and lateral weight shifting in standing without UE support and then adding in forwards reaching to grab horseshoes x 4 and reaching to place horseshoes on tall table to the R to  facilitate increased activation of proximal core/LE muscles and weight shifting for transfers and gait-required mod-max A overall due L retropulsion and decreased activation of ankle DF.  Returned to w/c stand pivot with RW and min-mod A to maintain weight shift forwards over BOS.  Pt returned to room and reported needing to use toilet.  Performed stand pivot w/c > toilet with UE support on grab bar-during pivot pt noted to have loose, incontinent BM.  Assisted pt with changing into clean gown and pt left on toilet with NT to assist pt with hygiene and transfer back to w/c once pt finished with BM.   Therapy Documentation Precautions:  Precautions Precautions: Fall Restrictions Weight Bearing Restrictions: No General:   Vital Signs: Therapy Vitals Pulse Rate: 78 BP: (!) 184/91 Patient Position (if appropriate): Sitting Oxygen Therapy SpO2: 93 % O2 Device: Not Delivered  See Function Navigator for Current Functional Status.   Therapy/Group: Individual Therapy  Edman CircleHall, Treston Coker [facility] Center[facility]ette 10/01/2016, 10:22 AM

## 2016-10-01 NOTE — Progress Notes (Signed)
Occupational Therapy Session Note  Patient Details  Name: Charlene Greene MRN: [id number] Date of Birth: 1922/10/16  Today's Date: 10/01/2016 OT Individual Time: 4098-11911303-1418 OT Individual Time Calculation (min): 75 min    Short Term Goals: Week 1:  OT Short Term Goal 1 (Week 1): Pt will engaged in 10 minutes of functional activity with less than 2 rest breaks secondary to fatigue. OT Short Term Goal 2 (Week 1): Pt will perform LB dressing with min A in order to increase I with self care. OT Short Term Goal 3 (Week 1): Pt will perform toileting with min A in order to increase I with self care. OT Short Term Goal 4 (Week 1): Pt will perform UB dressing with set up A in order to increase I with LB self care.     Skilled Therapeutic Interventions/Progress Updates:   Pt was wearing hospital gown and sitting in recliner at time of arrival just finishing up lunch. Pt reported that her laundry was in the washing machine. When she was finished, pt ambulated short distance to w/c with Mod A sit<stand and steady assist for ambulation with cues for forward gaze. Pt self propelled part way down hallway for UE strengthening with max vcs on technique and Mod A for avoiding left wall. Due to time, pt was pushed the rest of the way to laundry room. Laundry transferred to dryer with cues to use L UE, pt standing at machine with steady assist. Activity used for standing balance and L UE NMR. While dryer was going, pt completed seated L UE NMR task in dayroom with problem solving demands. Tx focus on in hand manipulation with pt exhibiting difficultly with palm/finger translation. Activity was one of pts favorite games in independent living and pt appeared to experience flow during session, and in turn increased psychosocial wellness. At end of session pt was returned to room and transferred back to bed. Pt was left with all needs within reach at time of departure.   Therapy Documentation Precautions:   Precautions Precautions: Fall Restrictions Weight Bearing Restrictions: No General:   Vital Signs: Therapy Vitals Temp: 98.6 F (37 C) Temp Source: Oral Pulse Rate: 73 Resp: 18 BP: (!) 134/58 Patient Position (if appropriate): Lying Oxygen Therapy SpO2: 92 % O2 Device: Not Delivered Pain: No c/o pain during session    ADL:     See Function Navigator for Current Functional Status.   Therapy/Group: Individual Therapy  Aiden Helzer A Breauna Mazzeo 10/01/2016, 3:41 PM

## 2016-10-02 ENCOUNTER — Inpatient Hospital Stay (HOSPITAL_COMMUNITY): Payer: Medicare Other | Admitting: Physical Therapy

## 2016-10-02 ENCOUNTER — Inpatient Hospital Stay (HOSPITAL_COMMUNITY): Payer: Medicare Other | Admitting: Occupational Therapy

## 2016-10-02 LAB — CBC WITH DIFFERENTIAL/PLATELET
Basophils Absolute: 0 10*3/uL (ref 0.0–0.1)
Basophils Relative: 1 %
EOS PCT: 2 %
Eosinophils Absolute: 0.1 10*3/uL (ref 0.0–0.7)
HEMATOCRIT: 43.7 % (ref 36.0–46.0)
Hemoglobin: 14.3 g/dL (ref 12.0–15.0)
LYMPHS ABS: 1.3 10*3/uL (ref 0.7–4.0)
LYMPHS PCT: 25 %
MCH: 28.7 pg (ref 26.0–34.0)
MCHC: 32.7 g/dL (ref 30.0–36.0)
MCV: 87.6 fL (ref 78.0–100.0)
MONO ABS: 0.3 10*3/uL (ref 0.1–1.0)
MONOS PCT: 6 %
NEUTROS ABS: 3.5 10*3/uL (ref 1.7–7.7)
Neutrophils Relative %: 66 %
PLATELETS: 243 10*3/uL (ref 150–400)
RBC: 4.99 MIL/uL (ref 3.87–5.11)
RDW: 13.7 % (ref 11.5–15.5)
WBC: 5.3 10*3/uL (ref 4.0–10.5)

## 2016-10-02 LAB — BASIC METABOLIC PANEL
ANION GAP: 9 (ref 5–15)
BUN: 17 mg/dL (ref 6–20)
CHLORIDE: 93 mmol/L — AB (ref 101–111)
CO2: 29 mmol/L (ref 22–32)
Calcium: 9 mg/dL (ref 8.9–10.3)
Creatinine, Ser: 1.23 mg/dL — ABNORMAL HIGH (ref 0.44–1.00)
GFR calc Af Amer: 43 mL/min — ABNORMAL LOW (ref >60)
GFR, EST NON AFRICAN AMERICAN: 37 mL/min — AB (ref >60)
GLUCOSE: 327 mg/dL — AB (ref 65–99)
POTASSIUM: 4.6 mmol/L (ref 3.5–5.1)
Sodium: 131 mmol/L — ABNORMAL LOW (ref 135–145)

## 2016-10-02 LAB — GLUCOSE, CAPILLARY
Glucose-Capillary: 141 mg/dL — ABNORMAL HIGH (ref 65–99)
Glucose-Capillary: 259 mg/dL — ABNORMAL HIGH (ref 65–99)
Glucose-Capillary: 194 mg/dL — ABNORMAL HIGH (ref 65–99)
Glucose-Capillary: 185 mg/dL — ABNORMAL HIGH (ref 65–99)

## 2016-10-02 MED ORDER — HYDRALAZINE HCL 50 MG PO TABS
50.0000 mg | ORAL_TABLET | Freq: Three times a day (TID) | ORAL | Status: DC
  Administered 2016-10-02 – 2016-10-07 (×15): 50 mg via ORAL
  Filled 2016-10-02 (×15): qty 1

## 2016-10-02 NOTE — Progress Notes (Signed)
Occupational Therapy Session Note  Patient Details  Name: Charlene Greene MRN: [id number] Date of Birth: 1922/06/23  Today's Date: 10/02/2016 OT Individual Time: [id number] OT Individual Time Calculation (min): 58 min     Short Term Goals: Week 1:  OT Short Term Goal 1 (Week 1): Pt will engaged in 10 minutes of functional activity with less than 2 rest breaks secondary to fatigue. OT Short Term Goal 2 (Week 1): Pt will perform LB dressing with min A in order to increase I with self care. OT Short Term Goal 3 (Week 1): Pt will perform toileting with min A in order to increase I with self care. OT Short Term Goal 4 (Week 1): Pt will perform UB dressing with set up A in order to increase I with LB self care.  Skilled Therapeutic Interventions/Progress Updates:     Upon entering the room, pt seated in wheelchair finishing breakfast with son present in the room. Pt with no c/o pain this session. Pt agreeable to OT intervention. Pt donned clothing with sit <>stand from wheelchair at sink. Pt needing mod verbal cues and demonstration for technique and anterior weight shift. Pt continued to need lifting assistance but much less with proper technique. Pt standing with min A for balance during LB clothing management. Pt needing assistance to don pants over B hips secondary to pants being very tight. Pt needing min A to pull shirt down trunk this session as well. Pt requiring increased rest break secondary to fatigue. She performed grooming tasks with set up A to obtain items at sink. Pt ambulated with RW 10' with min A to sit in recliner chair at end of session. Call bell and all needed items within reach upon exiting the room.   Therapy Documentation Precautions:  Precautions Precautions: Fall Restrictions Weight Bearing Restrictions: No General:   Vital Signs: Therapy Vitals BP: (!) 164/94  See Function Navigator for Current Functional Status.   Therapy/Group: Individual Therapy  Alen BleacherBradsher,  Euan Wandler P 10/02/2016, 9:56 AM

## 2016-10-02 NOTE — Evaluation (Signed)
Speech Language Pathology Assessment and Plan  Patient Details  Name: Charlene Greene MRN: [id number] Date of Birth: 1922/01/21  SLP Diagnosis: Dysphagia  Rehab Potential: Excellent ELOS: 1-2 additional visits post eval to reinforce swallowing precautions    Today's Date: 10/02/2016 SLP Individual Time: 1440-1500 SLP Individual Time Calculation (min): 20 min    Problem List: Patient Active Problem List   Diagnosis Date Noted  . Renovascular hypertension   . Hypertensive crisis   . Type 2 diabetes mellitus with diabetic autonomic neuropathy, without long-term current use of insulin ([place])   . Stage 3 chronic kidney disease   . Acute lower UTI   . Acute renal insufficiency 09/27/2016  . Urinary urgency 09/27/2016  . Basal ganglia infarction ([place]) 09/26/2016  . Neuropathic pain   . Benign essential HTN   . Slow transit constipation   . Elevated creatine kinase   . Accelerated hypertension 09/24/2016  . Diabetes mellitus ([place]) 09/24/2016  . Hyperlipidemia 09/24/2016  . Sick sinus syndrome ([place]) 09/24/2016  . CVA (cerebral vascular accident) ([place]) 09/23/2016  . Stroke (cerebrum) ([place]) 09/23/2016   Past Medical History:  Past Medical History:  Diagnosis Date  . Diabetes mellitus without complication ([place])   . Hypertension   . Pacemaker    Past Surgical History:  Past Surgical History:  Procedure Laterality Date  . PACEMAKER PLACEMENT      Assessment / Plan / Recommendation Clinical Impression   Charlene Cliftonis a 82 y.o.right handed femalewith history of diabetes mellitus, hypertension,SSSwith pacemaker maintain on aspirin 325 mg daily. Patient is a resident at [facility] independent living where she has been for the past 2 and half years. Presented 09/23/2016 with left-sided weakness 3-4 days and difficulty naming objects. At baseline patient was independent with assistive device prior to admission. CT of the head showed abnormal appearance of the right basal  ganglia region that could represent a combination of old and acute infarct. Patient was admitted for comprehensive rehabilitation program and SLP evaluation ordered 10/01/2016 due to difficulty swallowing and coughing after medication administration.   Pt presents with s/s of a mild oropharyngeal dysphagia.  Pt demonstrated good airway protection with solids and liquids alone although oral phase seemed slightly prolonged.  Pt demonstrated immediate coughing with mixed consistencies and large consecutive boluses of thin liquids via straw. Cues for rate and portion control as well as to finish bites of solids before taking sips of liquids were effective for minimizing overt s/s of airway invasion.  As a result, recommend 1-2 additional visits for ST to reinforce use of swallowing precautions.  Also recommend changing medication administration to meds whole with puree.    Skilled Therapeutic Interventions          Bedside swallow evaluation completed with results and recommendations reviewed with patient and family.     SLP Assessment  Patient will need skilled Speech Lanaguage Pathology Services during CIR admission    Recommendations  SLP Diet Recommendations: Age appropriate regular solids;Thin Liquid Administration via: Cup;Straw Medication Administration: Whole meds with puree Supervision: Patient able to self feed;Intermittent supervision to cue for compensatory strategies Compensations: Slow rate;Small sips/bites;Minimize environmental distractions Postural Changes and/or Swallow Maneuvers: Out of bed for meals Oral Care Recommendations: Oral care BID Patient destination: Home Follow up Recommendations: None Equipment Recommended: None recommended by SLP    SLP Frequency 1 to 3 out of 7 days   SLP Duration  SLP Intensity  SLP Treatment/Interventions 1-2 additional visits post eval to reinforce swallowing precautions  Minumum  of 1-2 x/day, 30 to 90 minutes  Dysphagia/aspiration  precaution training;Cueing hierarchy;Internal/external aids;Patient/family education    Pain Pain Assessment Pain Assessment: No/denies pain  Prior Functioning Cognitive/Linguistic Baseline: Within functional limits Type of Home: Independent living facility  Lives With: Alone Available Help at Discharge: Family;Available 24 hours/day Vocation: Retired  Function:  Eating Eating   Modified Consistency Diet: No Eating Assist Level: More than reasonable amount of time           Cognition Comprehension Comprehension assist level: Follows basic conversation/direction with no assist  Expression   Expression assist level: Expresses complex 90% of the time/cues < 10% of the time  Social Interaction Social Interaction assist level: Interacts appropriately with others with medication or extra time (anti-anxiety, antidepressant).  Problem Solving Problem solving assist level: Solves complex 90% of the time/cues < 10% of the time  Memory Memory assist level: Recognizes or recalls 75 - 89% of the time/requires cueing 10 - 24% of the time   Short Term Goals: Week 1: SLP Short Term Goal 1 (Week 1): STG=LTG due to ELOS   Refer to Care Plan for Long Term Goals  Recommendations for other services: None  Discharge Criteria: Patient will be discharged from SLP if patient refuses treatment 3 consecutive times without medical reason, if treatment goals not met, if there is a change in medical status, if patient makes no progress towards goals or if patient is discharged from hospital.  The above assessment, treatment plan, treatment alternatives and goals were discussed and mutually agreed upon: by patient and by family  Charlene Greene, Charlene Greene 10/02/2016, 4:25 PM

## 2016-10-02 NOTE — Progress Notes (Signed)
Physical Therapy Session Note  Patient Details  Name: Charlene Greene MRN: [id number] Date of Birth: [birth year]  Today's Date: 10/02/2016 PT Individual Time: 0930-1029 PT Individual Time Calculation (min): 59 min    Short Term Goals:Week 1:  PT Short Term Goal 1 (Week 1): Pt will perform bed mobility on flat bed with supervision PT Short Term Goal 2 (Week 1): Pt will perform sit <> stand and stand pivot transfers from w/c, bed or recliner with RW and min A PT Short Term Goal 3 (Week 1): Pt will perform gait x 100' with RW and min A in 8-10 minutes PT Short Term Goal 4 (Week 1): Pt will perform stair negotiation up/down 4 stairs x 2 reps with bilat UE support on rails and min A      Therapy Documentation Precautions:  Precautions Precautions: Fall Restrictions Weight Bearing Restrictions: No  Patient denies any pain just soreness in shoulders.   Sit to and from stand transfer with rollator close supervision Stand pivot transfer with rollator close supervision.  Patient ambulated with rollator 150 feetx3with a step through gait pattern. Patient demonstrates decreased cadence and step length with a forward flexed posture. Close supervision overall. Verbal cues for upright posture.  Standing there ex: B heel raises 3x10 B marching 15x  Patient performed sit to and from stand transfers from rollator seat. Verbal for proper sequence and technique. Patient sttempted to stand from seated position in rollator two different ways one by turning toward the right to the rollator and then again by bringing rollator to her. Patient required min assist for both and experienced a LOB posteriorly and to the left. Will continue to assess proper technique.  Short sit to supine in bed close supervison.  Patient left in bed at end of session with all needs met and bed alarm engaged. Patient required increased time to complete all tasks and required rest breaks after all activites. Overall patient  tolerated session well. COntinue to focus on cardiovascular endurance and rollator management.    See Function Navigator for Current Functional Status.   Therapy/Group: Individual Therapy  [name] 10/02/2016, 10:03 AM

## 2016-10-02 NOTE — Progress Notes (Signed)
Physical Therapy Session Note  Patient Details  Name: Charlene Greene MRN: [id number] Date of Birth: 01/30/1922  Today's Date: 10/02/2016 PT Individual Time: 4098-11911305-1415 PT Individual Time Calculation (min): 70 min    Short Term Goals: Week 1:  PT Short Term Goal 1 (Week 1): Pt will perform bed mobility on flat bed with supervision PT Short Term Goal 2 (Week 1): Pt will perform sit <> stand and stand pivot transfers from w/c, bed or recliner with RW and min A PT Short Term Goal 3 (Week 1): Pt will perform gait x 100' with RW and min A in 8-10 minutes PT Short Term Goal 4 (Week 1): Pt will perform stair negotiation up/down 4 stairs x 2 reps with bilat UE support on rails and min A  Skilled Therapeutic Interventions/Progress Updates:  Pt received seated EOB eating lunch; allowed pt to continue to eat while performing assessment of BP/vitals (see below) and discussing focus of therapy session and continued balance and gait training with rollator.  Pt performed sit > stand from bed and ambulated to the toilet with rollator and min A with min A for balance while performing toileting tasks without UE support.  Required min A for balance to stand safely from rollator after hand hygiene at sink.  Pt performed gait training x 150' with rollator with supervision-min A in 7 minutes and no seated rest break.  Continued LE strengthening and balance training on Kinetron at 80 cm/sec in sitting x 2 minutes + in standing x 2 reps with bilat UE support at 60 cm/sec with feet in DF with focus on lateral weight shifting and anterior rotation of L pelvis. Performed standing balance training with forward reaching to place rings on tall target up, forwards and to the R with R and LUE to facilitate more upright trunk, hip extension, anterior rotation of L pelvis and weight shift to R.  Returned to room performing gait with rollator with tactile cues for L trunk elongation, R trunk shortening during gait and verbal cues for  upright posture, full step and stride length and foot clearance.  At end of session pt left in recliner with LE elevated and all items within reach.   Therapy Documentation Precautions:  Precautions Precautions: Fall Restrictions Weight Bearing Restrictions: No Vital Signs: Therapy Vitals Pulse Rate: 78 BP: (!) 157/80 Patient Position (if appropriate): Sitting Oxygen Therapy SpO2: 93 % O2 Device: Not Delivered Pain: Pain Assessment Pain Assessment: No/denies pain  See Function Navigator for Current Functional Status.   Therapy/Group: Individual Therapy  Edman CircleHall, Anaaya Fuster [facility] 10/02/2016, 2:18 PM

## 2016-10-02 NOTE — Progress Notes (Signed)
[place] PHYSICAL MEDICINE & REHABILITATION     PROGRESS NOTE    Subjective/Complaints: Pt sitting up at the edge of her bed eating breakfast.  She slept well overnight and does not have any complaints this AM.  Son at bedside, who agrees.   ROS: Denies CP, SOB, N/V/D.   Objective: Vital Signs: Blood pressure (!) 164/94, pulse 75, temperature 98.4 F (36.9 C), temperature source Oral, resp. rate 18, height 5\' 2"  (1.575 m), weight 82.4 kg (181 lb 9.6 oz), SpO2 95 %. No results found. No results for input(s): WBC, HGB, HCT, PLT in the last 72 hours. No results for input(s): NA, K, CL, GLUCOSE, BUN, CREATININE, CALCIUM in the last 72 hours.  Invalid input(s): CO CBG (last 3)   Recent Labs  10/01/16 1639 10/01/16 2039 10/02/16 0646  GLUCAP 183* 170* 141*    Wt Readings from Last 3 Encounters:  09/26/16 82.4 kg (181 lb 9.6 oz)  09/23/16 83 kg (183 lb)    Physical Exam:  Constitutional: no distress. Vital signs reviewed. Well developed.  HENT: [place]. AT Eyes: EOMI. No discharge.  Cardiovascular: RRR. No JVD.  Respiratory: CTA bilaterally. Unlabored.  GI: BS+. NT Musculoskeletal: She exhibits no tenderness.  Neurological: She is alert and oriented.  Motor: RUE: 4+/5 proximal to distal RLE: 4/5 proximal to distal LUE: 4/5 proximal to distal  LLE: HF, KE 4-/5 proximal to distal Skin: Skin  intact  Psychiatric: pleasant and coopeartive   Assessment/Plan: 1. Functional deficits secondary to right basal ganglia infarct which require 3+ hours per day of interdisciplinary therapy in a comprehensive inpatient rehab setting. Physiatrist is providing close team supervision and 24 hour management of active medical problems listed below. Physiatrist and rehab team continue to assess barriers to discharge/monitor patient progress toward functional and medical goals.  Function:  Bathing Bathing position   Position: Shower  Bathing parts Body parts bathed by patient: Right arm,  Left arm, Chest, Abdomen, Front perineal area, Buttocks, Right upper leg, Left upper leg, Right lower leg, Left lower leg Body parts bathed by helper: Back  Bathing assist Assist Level: Touching or steadying assistance(Pt > 75%)      Upper Body Dressing/Undressing Upper body dressing   What is the patient wearing?: Hospital gown Bra - Perfomed by patient: Thread/unthread right bra strap, Thread/unthread left bra strap Bra - Perfomed by helper: Hook/unhook bra (pull down sports bra) Pull over shirt/dress - Perfomed by patient: Thread/unthread right sleeve, Thread/unthread left sleeve, Put head through opening Pull over shirt/dress - Perfomed by helper: Pull shirt over trunk        Upper body assist Assist Level: Set up      Lower Body Dressing/Undressing Lower body dressing   What is the patient wearing?: Non-skid slipper socks Underwear - Performed by patient: Thread/unthread right underwear leg, Thread/unthread left underwear leg Underwear - Performed by helper: Pull underwear up/down Pants- Performed by patient: Thread/unthread right pants leg, Thread/unthread left pants leg Pants- Performed by helper: Pull pants up/down Non-skid slipper socks- Performed by patient: Don/doff right sock Non-skid slipper socks- Performed by helper: Don/doff left sock     Shoes - Performed by patient: Don/doff right shoe, Don/doff left shoe, Fasten right, Fasten left         TED Hose - Performed by helper: Don/doff right TED hose, Don/doff left TED hose  Lower body assist Assist for lower body dressing: Touching or steadying assistance (Pt > 75%)      LexicographerToileting Toileting   Toileting  steps completed by patient: Adjust clothing prior to toileting, Performs perineal hygiene, Adjust clothing after toileting Toileting steps completed by helper: Adjust clothing after toileting Toileting Assistive Devices: Grab bar or rail  Toileting assist Assist level: Touching or steadying assistance (Pt.75%)    Transfers Chair/bed transfer   Chair/bed transfer method: Stand pivot Chair/bed transfer assist level: Moderate assist (Pt 50 - 74%/lift or lower) Chair/bed transfer assistive device: Walker, Designer, fashion/clothingArmrests     Locomotion Ambulation     Max distance: 100 Assist level: Moderate assist (Pt 50 - 74%)   Wheelchair   Type: Manual Max wheelchair distance: 6325ft  Assist Level: Touching or steadying assistance (Pt > 75%)  Cognition Comprehension Comprehension assist level: Follows basic conversation/direction with no assist  Expression Expression assist level: Expresses complex 90% of the time/cues < 10% of the time  Social Interaction Social Interaction assist level: Interacts appropriately 90% of the time - Needs monitoring or encouragement for participation or interaction.  Problem Solving Problem solving assist level: Solves complex 90% of the time/cues < 10% of the time  Memory Memory assist level: Recognizes or recalls 75 - 89% of the time/requires cueing 10 - 24% of the time    Medical Problem List and Plan: 1.  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  DVT Prophylaxis/Anticoagulation: Subcutaneous Lovenox. Monitor platelet counts and any signs of bleeding 3. Pain Management: Lyrica 75 mg twice a day 4. Mood: BuSpar 5 mg twice a day, melatonin 3 mg daily at bedtime resumed for sleep 5. Neuropsych: This patient is capable of making decisions on her own behalf. 6. Skin/Wound Care: loca care 7. Fluids/Electrolytes/Nutrition: encouragePO.  8. Sick sinus syndrome with pacemaker. Cardiac rate control 9. Hypertension:   Imdur increased to 60mg  daily 11/17  Toprol-XL 50 mg daily- increased to 100mg  daily 11/18  Ranexa 500 mg twice a day,   Norvasc 5 mg twice a day, increased 10 BID  Lisinopril 40 mg twice a day   Started Hydralazine 25 TID on 11/20, increased to 50 11/21  Renal ultrasound ordered 11/20, pending 10.  Diabetes mellitus with peripheral neuropathy. Hemoglobin A1c 5.2.SSI. Check blood sugars before meals and at bedtime.   Patient on Amaryl 2 mg twice daily at home--increased to 1mg  BID.  Improving with 1 spike again yesterday, will cont to monitor and consider further increase if necessary 11. Hyperlipidemia. Fenofibrate 160 mg daily,Zetia 10 mg daily. 12. Constipation. Laxative assistance 13? CKD vs. AKI:   Cr 1.23 on 11/16  Cont to monitor, labs pending 14. UTI  -UA+, UCX 100k proteus mirabilis  -amoxil 11/17-11/24  -oob to void  LOS (Days) 6 A FACE TO FACE EVALUATION WAS PERFORMED  Ankit Karis Jubanil Patel 10/02/2016 9:09 AM

## 2016-10-03 ENCOUNTER — Inpatient Hospital Stay (HOSPITAL_COMMUNITY): Payer: Medicare Other | Admitting: Occupational Therapy

## 2016-10-03 ENCOUNTER — Inpatient Hospital Stay (HOSPITAL_COMMUNITY): Payer: Medicare Other | Admitting: *Deleted

## 2016-10-03 ENCOUNTER — Inpatient Hospital Stay (HOSPITAL_COMMUNITY): Payer: Medicare Other | Admitting: Speech Pathology

## 2016-10-03 DIAGNOSIS — E871 Hypo-osmolality and hyponatremia: Secondary | ICD-10-CM

## 2016-10-03 LAB — GLUCOSE, CAPILLARY
GLUCOSE-CAPILLARY: 252 mg/dL — AB (ref 65–99)
GLUCOSE-CAPILLARY: 139 mg/dL — AB (ref 65–99)
GLUCOSE-CAPILLARY: 177 mg/dL — AB (ref 65–99)
Glucose-Capillary: 163 mg/dL — ABNORMAL HIGH (ref 65–99)

## 2016-10-03 MED ORDER — PRESERVISION AREDS 2 PO CAPS
1.0000 | ORAL_CAPSULE | Freq: Two times a day (BID) | ORAL | Status: DC
  Administered 2016-10-03 – 2016-10-11 (×16): 1 via ORAL
  Filled 2016-10-03 (×18): qty 1

## 2016-10-03 NOTE — Progress Notes (Signed)
Speech Language Pathology Daily Session Note  Patient Details  Name: Charlene Greene MRN: [id number] Date of Birth: Jan 26, 1922  Today's Date: 10/03/2016 SLP Concurrent Time: 0725-0820 SLP Concurrent Time Calculation (min): 55 min    Short Term Goals: Week 1: SLP Short Term Goal 1 (Week 1): STG=LTG due to ELOS   Skilled Therapeutic Interventions: Skilled treatment session focused on dysphagia goals. SLP facilitated session by providing skilled observation with breakfast meal of regular textures with mixed consistencies (cereal) and thin liquids. Patient consumed meal with Mod I for use of swallowing compensatory strategies but demonstrated minimal, subtle and intermittent dry coughing. However, patient reports baseline dry cough since thyroid surgery ~30 years ago in which she manages with throat lozenges. Therefore, difficult do differentiate possible penetration/aspiration vs baseline cough. Recommend patient continue current diet. Will f/u X 1 more session prior to discharge. Patient left upright in bed with all needs within reach. Continue with current plan of care.   Function:  Eating Eating   Modified Consistency Diet: No Eating Assist Level: Swallowing techniques: self managed;Set up assist for   Eating Set Up Assist For: Opening containers       Cognition Comprehension Comprehension assist level: Follows complex conversation/direction with no assist  Expression   Expression assist level: Expresses complex 90% of the time/cues < 10% of the time  Social Interaction Social Interaction assist level: Interacts appropriately with others with medication or extra time (anti-anxiety, antidepressant).  Problem Solving Problem solving assist level: Solves complex 90% of the time/cues < 10% of the time  Memory Memory assist level: More than reasonable amount of time    Pain No/Denies Pain   Therapy/Group: Individual Therapy  Charlene Greene 10/03/2016, 8:53 AM

## 2016-10-03 NOTE — Patient Care Conference (Signed)
Inpatient RehabilitationTeam Conference and Plan of Care Update Date: 10/03/2016   Time: 11:30 am    Patient Name: Charlene Greene      Medical Record Number: [id number]  Date of Birth: [birth year] Sex: Female         Room/Bed: 4W05C/4W05C-01 Payor Info: Payor: MEDICARE / Plan: MEDICARE PART A AND B / Product Type: *No Product type* /    Admitting Diagnosis: CVA  Admit Date/Time:  09/26/2016  6:14 PM Admission Comments: No comment available   Primary Diagnosis:  Basal ganglia infarction (HCC) Principal Problem: Basal ganglia infarction [facility](HCC)  Patient Active Problem List   Diagnosis Date Noted  . Hyponatremia   . Renovascular hypertension   . Hypertensive crisis   . Type 2 diabetes mellitus with diabetic autonomic neuropathy, without long-term current use of insulin (HCC)   . Stage 3 chronic kidney disease   . Acute lower UTI   . Acute renal insufficiency 09/27/2016  . Urinary urgency 09/27/2016  . Basal ganglia infarction (HCC) 09/26/2016  . Neuropathic pain   . Benign essential HTN   . Slow transit constipation   . Elevated creatine kinase   . Accelerated hypertension 09/24/2016  . Diabetes mellitus (HCC) 09/24/2016  . Hyperlipidemia 09/24/2016  . Sick sinus syndrome (HCC) 09/24/2016  . CVA (cerebral vascular accident) (HCC) 09/23/2016  . Stroke (cerebrum) (HCC) 09/23/2016    Expected Discharge Date: Expected Discharge Date: 10/11/16  Team Members Present: Physician leading conference: Dr. Maryla MorrowAnkit Patel Social Worker Present: Amada JupiterLucy Minna Dumire, LCSW Nurse Present: Chana Bodeeborah Sharp, RN PT Present: Karolee StampsAlison Gray, PT OT Present: Callie FieldingKatie Pittman, OT SLP Present: Feliberto Gottronourtney Payne, SLP PPS Coordinator present : Tora DuckMarie Noel, RN, CRRN     Current Status/Progress Goal Weekly Team Focus  Medical   Left hemiparesis secondary to right basal ganglia infarct secondary to small vessel disease on 11/12  Improve mobility, safety, DM, HTN, electrolytes  See above   Bowel/Bladder   continent of bowel  and bladder LBM 09/30/16  remain continent of bowel and bladder  continue to monitor q shift   Swallow/Nutrition/ Hydration   Regular textures with thin liquids, Intermittent supervision  Mod I with least restrictive diet  Increase use of swallowing compensatory strategies   ADL's   Min A overall   Supervision-Mod I   Activity tolerance, L UE NMR, standing endurance/balance, cognition, safety awareness during functional transfers and self care completion   Mobility   Min A   Supervision-mod I  balance, gait with rollator, activity tolerance, postural control   Communication             Safety/Cognition/ Behavioral Observations  no unsafe behaviors  min assist  continue to monitor    Pain   no complaints of pain  pain less than or equal to 2  continue to monitor q shift   Skin   no skin breakdown  no skin breakdown this admission  continue to monitor q shift    Rehab Goals Patient on target to meet rehab goals: Yes *See Care Plan and progress notes for long and short-term goals.  Barriers to Discharge: Uncontrolled HTN, DM, hyponatremia, UTI, safety, mobility    Possible Resolutions to Barriers:  Monitor BP, DM, folow labs, abx for UTI, therapies    Discharge Planning/Teaching Needs:  Pt to return to her independent living apt [facility] [address] [place].  NA - mod ind goals.   Team Discussion:  Medically more stable.  BP better controled.  Doing will with PT/OT and using rollator.  Currently supervision to min assist overall with mod ind goals.  ST saw pt for swallowing but issues appear to be baseline.    Revisions to Treatment Plan:  None   Continued Need for Acute Rehabilitation Level of Care: The patient requires daily medical management by a physician with specialized training in physical medicine and rehabilitation for the following conditions: Daily direction of a multidisciplinary physical rehabilitation program to ensure safe treatment while eliciting the highest outcome  that is of practical value to the patient.: Yes Daily medical management of patient stability for increased activity during participation in an intensive rehabilitation regime.: Yes Daily analysis of laboratory values and/or radiology reports with any subsequent need for medication adjustment of medical intervention for : Neurological problems;Diabetes problems;Blood pressure problems  Charlene Greene 10/03/2016, 1:31 PM

## 2016-10-03 NOTE — Progress Notes (Signed)
Social Work Patient ID: Charlene Greene Eye, female   DOB: 1921-11-19, 82 y.o.   MRN: [id number]   Have reviewed team conference with pt, son and daughter-in-law.  All aware and agreeable with targeted d/c date of 11/30 with plan to return to independent apartment [place] [address].  Pt denies any concerns.  Dtr-in-law spoke with me privately and expressed concerns about pt's attention span and social interactions which "are not her normal way...".  Offers example that pt continued to work on puzzle and made no eye contact with them during their visit yesterday "...which she never would have done before."  She also reports pt having periods of "blank stares" which concern them "because that's what her sister would do when she was getting Alzheimers..."  Will alert team to these concerns.  Continue to follow.  Aayden Cefalu, LCSW

## 2016-10-03 NOTE — Progress Notes (Signed)
[place] PHYSICAL MEDICINE & REHABILITATION     PROGRESS NOTE    Subjective/Complaints: Pt seen sitting up in bed this AM, working with SLP.  She asks if she can go home tomorrow to be with family.    ROS: Denies CP, SOB, N/V/D.   Objective: Vital Signs: Blood pressure 125/66, pulse 71, temperature 98.4 F (36.9 C), temperature source Oral, resp. rate 18, height 5\' 2"  (1.575 m), weight 82.4 kg (181 lb 9.6 oz), SpO2 90 %. No results found.  Recent Labs  10/02/16 0914  WBC 5.3  HGB 14.3  HCT 43.7  PLT 243    Recent Labs  10/02/16 0914  NA 131*  K 4.6  CL 93*  GLUCOSE 327*  BUN 17  CREATININE 1.23*  CALCIUM 9.0   CBG (last 3)   Recent Labs  10/02/16 1638 10/02/16 2041 10/03/16 0631  GLUCAP 194* 185* 163*    Wt Readings from Last 3 Encounters:  09/26/16 82.4 kg (181 lb 9.6 oz)  09/23/16 83 kg (183 lb)    Physical Exam:  Constitutional: No distress. Vital signs reviewed. Well developed.  HENT: [place]. AT Eyes: EOMI. No discharge.  Cardiovascular: RRR. No JVD.  Respiratory: CTA bilaterally. Unlabored.  GI: BS+. NT Musculoskeletal: She exhibits no tenderness.  Neurological: She is alert and oriented.  Motor: RUE: 4+/5 proximal to distal RLE: 4/5 proximal to distal LUE: 4/5 proximal to distal (stable) LLE: 4-/5 proximal to distal Skin: Skin  intact  Psychiatric: pleasant and coopeartive   Assessment/Plan: 1. Functional deficits secondary to right basal ganglia infarct which require 3+ hours per day of interdisciplinary therapy in a comprehensive inpatient rehab setting. Physiatrist is providing close team supervision and 24 hour management of active medical problems listed below. Physiatrist and rehab team continue to assess barriers to discharge/monitor patient progress toward functional and medical goals.  Function:  Bathing Bathing position   Position: Shower  Bathing parts Body parts bathed by patient: Right arm, Left arm, Chest, Abdomen, Front  perineal area, Buttocks, Right upper leg, Left upper leg, Right lower leg, Left lower leg Body parts bathed by helper: Back  Bathing assist Assist Level: Touching or steadying assistance(Pt > 75%)      Upper Body Dressing/Undressing Upper body dressing   What is the patient wearing?: Pull over shirt/dress Bra - Perfomed by patient: Thread/unthread right bra strap, Thread/unthread left bra strap Bra - Perfomed by helper: Hook/unhook bra (pull down sports bra) Pull over shirt/dress - Perfomed by patient: Thread/unthread right sleeve, Thread/unthread left sleeve, Put head through opening Pull over shirt/dress - Perfomed by helper: Pull shirt over trunk        Upper body assist Assist Level: Touching or steadying assistance(Pt > 75%)      Lower Body Dressing/Undressing Lower body dressing   What is the patient wearing?: Pants Underwear - Performed by patient: Thread/unthread right underwear leg, Thread/unthread left underwear leg Underwear - Performed by helper: Pull underwear up/down Pants- Performed by patient: Thread/unthread right pants leg, Thread/unthread left pants leg Pants- Performed by helper: Pull pants up/down Non-skid slipper socks- Performed by patient: Don/doff right sock Non-skid slipper socks- Performed by helper: Don/doff left sock     Shoes - Performed by patient: Don/doff right shoe, Don/doff left shoe, Fasten right, Fasten left         TED Hose - Performed by helper: Don/doff right TED hose, Don/doff left TED hose  Lower body assist Assist for lower body dressing: Touching or steadying assistance (Pt >  75%)      Toileting Toileting   Toileting steps completed by patient: Adjust clothing prior to toileting, Performs perineal hygiene, Adjust clothing after toileting Toileting steps completed by helper: Adjust clothing prior to toileting Toileting Assistive Devices: Grab bar or rail  Toileting assist Assist level: Touching or steadying assistance (Pt.75%)    Transfers Chair/bed transfer   Chair/bed transfer method: Stand pivot Chair/bed transfer assist level: Touching or steadying assistance (Pt > 75%) Chair/bed transfer assistive device: Patent attorneyWalker     Locomotion Ambulation     Max distance: 150 Assist level: Touching or steadying assistance (Pt > 75%)   Wheelchair   Type: Manual Max wheelchair distance: 4725ft  Assist Level: Touching or steadying assistance (Pt > 75%)  Cognition Comprehension Comprehension assist level: Follows complex conversation/direction with no assist  Expression Expression assist level: Expresses complex 90% of the time/cues < 10% of the time  Social Interaction Social Interaction assist level: Interacts appropriately with others with medication or extra time (anti-anxiety, antidepressant).  Problem Solving Problem solving assist level: Solves complex 90% of the time/cues < 10% of the time  Memory Memory assist level: More than reasonable amount of time    Medical Problem List and Plan: 1.  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  DVT Prophylaxis/Anticoagulation: Subcutaneous Lovenox. Monitor platelet counts and any signs of bleeding 3. Pain Management: Lyrica 75 mg twice a day 4. Mood: BuSpar 5 mg twice a day, melatonin 3 mg daily at bedtime resumed for sleep 5. Neuropsych: This patient is capable of making decisions on her own behalf. 6. Skin/Wound Care: loca care 7. Fluids/Electrolytes/Nutrition: encouragePO.  8. Sick sinus syndrome with pacemaker. Cardiac rate control 9. Hypertension:   Imdur increased to 60mg  daily 11/17  Toprol-XL 50 mg daily- increased to 100mg  daily 11/18  Ranexa 500 mg twice a day,   Norvasc 5 mg twice a day, increased 10 BID  Lisinopril 40 mg twice a day   Started Hydralazine 25 TID on 11/20, increased to 50 11/21  Improving  Renal ultrasound ordered 11/20, remains pending 10. Diabetes mellitus with  peripheral neuropathy. Hemoglobin A1c 5.2.SSI. Check blood sugars before meals and at bedtime.   Patient on Amaryl 2 mg twice daily at home--increased to 1mg  BID.  Overall improving, will consider further increase if necessary tomorrow 11. Hyperlipidemia. Fenofibrate 160 mg daily,Zetia 10 mg daily. 12. Constipation. Laxative assistance 13? CKD vs. AKI (likely CKD):   Cr 1.23 on 11/21 14. UTI  -UA+, UCX 100k proteus mirabilis  -amoxil 11/17-11/24  -oob to void 15. Hyponatremia:  Na+ 131 on 11/21  Will cont to monitor  LOS (Days) 7 A FACE TO FACE EVALUATION WAS PERFORMED  Tyona Nilsen Karis Jubanil Pebble Botkin 10/03/2016 9:44 AM

## 2016-10-03 NOTE — Progress Notes (Signed)
Social Work Patient ID: Charlene Greene, female   DOB: March 04, 1922, 82 y.o.   MRN: [id number]  Charlene PancoastLucy R Areliz Rothman, LCSW Social Worker Signed   Patient Care Conference Date of Service: 10/03/2016  1:31 PM      Hide copied text Hover for attribution information Inpatient RehabilitationTeam Conference and Plan of Care Update Date: 10/03/2016   Time: 11:30 am      Patient Name: Charlene Greene      Medical Record Number: [id number]  Date of Birth: March 04, 1922 Sex: Female         Room/Bed: 4W05C/4W05C-01 Payor Info: Payor: MEDICARE / Plan: MEDICARE PART A AND B / Product Type: *No Product type* /     Admitting Diagnosis: CVA  Admit Date/Time:  09/26/2016  6:14 PM Admission Comments: No comment available    Primary Diagnosis:  Basal ganglia infarction (HCC) Principal Problem: Basal ganglia infarction [facility](HCC)       Patient Active Problem List    Diagnosis Date Noted  . Hyponatremia    . Renovascular hypertension    . Hypertensive crisis    . Type 2 diabetes mellitus with diabetic autonomic neuropathy, without long-term current use of insulin (HCC)    . Stage 3 chronic kidney disease    . Acute lower UTI    . Acute renal insufficiency 09/27/2016  . Urinary urgency 09/27/2016  . Basal ganglia infarction (HCC) 09/26/2016  . Neuropathic pain    . Benign essential HTN    . Slow transit constipation    . Elevated creatine kinase    . Accelerated hypertension 09/24/2016  . Diabetes mellitus (HCC) 09/24/2016  . Hyperlipidemia 09/24/2016  . Sick sinus syndrome (HCC) 09/24/2016  . CVA (cerebral vascular accident) (HCC) 09/23/2016  . Stroke (cerebrum) (HCC) 09/23/2016      Expected Discharge Date: Expected Discharge Date: 10/11/16   Team Members Present: Physician leading conference: Dr. Maryla MorrowAnkit Patel Social Worker Present: Amada JupiterLucy Samie Reasons, LCSW Nurse Present: Chana Bodeeborah Sharp, RN PT Present: Karolee StampsAlison Gray, PT OT Present: Callie FieldingKatie Pittman, OT SLP Present: Feliberto Gottronourtney Payne, SLP PPS Coordinator present : Tora DuckMarie  Noel, RN, CRRN       Current Status/Progress Goal Weekly Team Focus  Medical   Left hemiparesis secondary to right basal ganglia infarct secondary to small vessel disease on 11/12  Improve mobility, safety, DM, HTN, electrolytes  See above   Bowel/Bladder   continent of bowel and bladder LBM 09/30/16  remain continent of bowel and bladder  continue to monitor q shift   Swallow/Nutrition/ Hydration   Regular textures with thin liquids, Intermittent supervision  Mod I with least restrictive diet  Increase use of swallowing compensatory strategies   ADL's   Min A overall   Supervision-Mod I   Activity tolerance, L UE NMR, standing endurance/balance, cognition, safety awareness during functional transfers and self care completion   Mobility   Min A   Supervision-mod I  balance, gait with rollator, activity tolerance, postural control   Communication             Safety/Cognition/ Behavioral Observations no unsafe behaviors  min assist  continue to monitor    Pain   no complaints of pain  pain less than or equal to 2  continue to monitor q shift   Skin   no skin breakdown  no skin breakdown this admission  continue to monitor q shift     Rehab Goals Patient on target to meet rehab goals: Yes *See Care Plan and progress notes for long and short-term  goals.   Barriers to Discharge: Uncontrolled HTN, DM, hyponatremia, UTI, safety, mobility   Possible Resolutions to Barriers:  Monitor BP, DM, folow labs, abx for UTI, therapies   Discharge Planning/Teaching Needs:  Pt to return [facility] independent living [facility] [place] [place].  NA - mod ind goals.   Team Discussion:  Medically more stable.  BP better controled.  Doing will with PT/OT and using rollator.  Currently supervision to min assist overall with mod ind goals.  ST saw pt for swallowing but issues appear to be baseline.    Revisions to Treatment Plan:  None    Continued Need for Acute Rehabilitation Level of Care: The patient  requires daily medical management by a physician with specialized training in physical medicine and rehabilitation for the following conditions: Daily direction of a multidisciplinary physical rehabilitation program to ensure safe treatment while eliciting the highest outcome that is of practical value to the patient.: Yes Daily medical management of patient stability for increased activity during participation in an intensive rehabilitation regime.: Yes Daily analysis of laboratory values and/or radiology reports with any subsequent need for medication adjustment of medical intervention for : Neurological problems;Diabetes problems;Blood pressure problems   [name] 10/03/2016, 1:31 PM

## 2016-10-03 NOTE — Progress Notes (Signed)
Physical Therapy Session Note  Patient Details  Name: Charlene Greene MRN: [id number] Date of Birth: [birth year]  Today's Date: 10/03/2016 PT Individual Time: [id number] PT Individual Time Calculation (min): 70 min    Short Term Goals: Week 1:  PT Short Term Goal 1 (Week 1): Pt will perform bed mobility on flat bed with supervision PT Short Term Goal 2 (Week 1): Pt will perform sit <> stand and stand pivot transfers from w/c, bed or recliner with RW and min A PT Short Term Goal 3 (Week 1): Pt will perform gait x 100' with RW and min A in 8-10 minutes PT Short Term Goal 4 (Week 1): Pt will perform stair negotiation up/down 4 stairs x 2 reps with bilat UE support on rails and min A Week 2:     Skilled Therapeutic Interventions/Progress Updates:    Tx focused on functional mobility training, gait with RW, and NMR via forced use, manual facilitation, and multi-modal cues. Family present and engaged. Son Charlene Reus([name]) concerned about pt's flat affect and is concerned about depression and lack of motivation for progress following DC due to pt's recent loss of best friend at ALF. This PT left a sticky note to request neuropsych eval as son would appreciate this very much to determine what is related to CVA and what is related to normal adjustment/sadness.   Pt performed multiple sit<>stand transfers from various surfaces and heights, including rollator, toilet, recliner, and WC. She needed up to Mod lifting assist, but S at least with strong UE support. Pt needed minimal safety cues, but demonstrates very slow movements.   Gait in room, controlled setting, and carpeted day room 2x150' with close S and cues/challenges for changes in gait speed. Pt uses brakes appropriately and is limited by fatigue.   Pt performed sidestepping along tables x20 each direction for increased functional balance with UE support.   Standing ball toss with alphabet foods to grandson to challenge righting reactions and bil UE use. Pt  able to stand with 1/0 UE support x3 minutes with S.  Pt left up in recliner with family and all needs in reach.    Therapy Documentation Precautions:  Precautions Precautions: Fall Restrictions Weight Bearing Restrictions: No General:   Vital Signs: Therapy Vitals Temp: 98.5 F (36.9 C) Temp Source: Oral Pulse Rate: 84 Resp: 18 BP: 92/66 Patient Position (if appropriate): Sitting Oxygen Therapy SpO2: 94 % O2 Device: Not Delivered Pain: none reported     See Function Navigator for Current Functional Status.   Therapy/Group: Individual Therapy  Charlene Greene, PT, DPT   Charlene LamingKAMPEN, Charlene Greene 10/03/2016, 3:44 PM

## 2016-10-03 NOTE — Progress Notes (Signed)
Occupational Therapy Session Note  Patient Details  Name: Charlene Greene MRN: [id number] Date of Birth: [birth year]  Today's Date: 10/03/2016 OT Individual Time: [id number] OT Individual Time Calculation (min): 73 min     Short Term Goals: Week 1:  OT Short Term Goal 1 (Week 1): Pt will engaged in 10 minutes of functional activity with less than 2 rest breaks secondary to fatigue. OT Short Term Goal 2 (Week 1): Pt will perform LB dressing with min A in order to increase I with self care. OT Short Term Goal 3 (Week 1): Pt will perform toileting with min A in order to increase I with self care. OT Short Term Goal 4 (Week 1): Pt will perform UB dressing with set up A in order to increase I with LB self care.  Skilled Therapeutic Interventions/Progress Updates:     Upon entering the room, pt supine in bed awaiting therapist with no c/o pain and agreeable to OT intervention. Pt performed supine >sit with min A to EOB. Pt standing with rollator and steadying assistance. Pt ambulating into bathroom for toileting with steady assistance for transfer, clothing management, and hygiene. Pt ambulating to shower with rollator and steady assistance for shower transfers. Pt engaged in bathing from seated position and use of LH sponge to increase I with bathing. Pt standing once to wash buttocks with steady assistance for safety. Pt returned to sit at sink in rollator seat for grooming and dressing tasks. Pt needing min cues for hemiplegic dressing techniques. Pt needing multiple rest breaks secondary to fatigue and min cues to lock rollator breaks for safety. Pt returning to recliner chair at end of session with call bell and all needed items within reach upon exiting the room.   Therapy Documentation Precautions:  Precautions Precautions: Fall Restrictions Weight Bearing Restrictions: No Pain: Pain Assessment Pain Assessment: No/denies pain Pain Score: 0-No pain  See Function Navigator for Current  Functional Status.   Therapy/Group: Individual Therapy  Alen BleacherBradsher, Keefer Soulliere P 10/03/2016, 12:27 PM

## 2016-10-04 ENCOUNTER — Inpatient Hospital Stay (HOSPITAL_COMMUNITY): Payer: Medicare Other | Admitting: Speech Pathology

## 2016-10-04 ENCOUNTER — Inpatient Hospital Stay (HOSPITAL_COMMUNITY): Payer: Medicare Other | Admitting: Physical Therapy

## 2016-10-04 DIAGNOSIS — R7309 Other abnormal glucose: Secondary | ICD-10-CM

## 2016-10-04 DIAGNOSIS — I952 Hypotension due to drugs: Secondary | ICD-10-CM

## 2016-10-04 LAB — GLUCOSE, CAPILLARY
GLUCOSE-CAPILLARY: 135 mg/dL — AB (ref 65–99)
GLUCOSE-CAPILLARY: 180 mg/dL — AB (ref 65–99)
GLUCOSE-CAPILLARY: 145 mg/dL — AB (ref 65–99)
Glucose-Capillary: 177 mg/dL — ABNORMAL HIGH (ref 65–99)

## 2016-10-04 NOTE — Plan of Care (Signed)
Problem: RH PAIN MANAGEMENT Goal: RH STG PAIN MANAGED AT OR BELOW PT'S PAIN GOAL Pain < or =3 with min assistance  Outcome: Progressing No c/o pain

## 2016-10-04 NOTE — Progress Notes (Addendum)
[place] PHYSICAL MEDICINE & REHABILITATION     PROGRESS NOTE    Subjective/Complaints: Pt sitting up in her chair this AM.  She is looking forward to getting rest.  She states she was "promoted" because her room changed.   She slept well overnight.   ROS: Denies CP, SOB, N/V/D.   Objective: Vital Signs: Blood pressure 122/61, pulse 88, temperature 98.4 F (36.9 C), temperature source Oral, resp. rate 18, height 5\' 2"  (1.575 m), weight 82.4 kg (181 lb 9.6 oz), SpO2 96 %. No results found.  Recent Labs  10/02/16 0914  WBC 5.3  HGB 14.3  HCT 43.7  PLT 243    Recent Labs  10/02/16 0914  NA 131*  K 4.6  CL 93*  GLUCOSE 327*  BUN 17  CREATININE 1.23*  CALCIUM 9.0   CBG (last 3)   Recent Labs  10/03/16 1638 10/03/16 2041 10/04/16 0631  GLUCAP 139* 177* 135*    Wt Readings from Last 3 Encounters:  09/26/16 82.4 kg (181 lb 9.6 oz)  09/23/16 83 kg (183 lb)    Physical Exam:  Constitutional: No distress. Vital signs reviewed. Well developed.  HENT: [place]. AT Eyes: EOMI. No discharge.  Cardiovascular: RRR. No JVD.  Respiratory: CTA bilaterally. Unlabored.  GI: BS+. NT Musculoskeletal: She exhibits no tenderness.  Neurological: She is alert and oriented.  Motor: RUE: 4+/5 proximal to distal RLE: 4/5 proximal to distal LUE: 4/5 proximal to distal (unchanged) LLE: 4-/5 proximal to distal Skin: Skin  intact  Psychiatric: pleasant and coopeartive   Assessment/Plan: 1. Functional deficits secondary to right basal ganglia infarct which require 3+ hours per day of interdisciplinary therapy in a comprehensive inpatient rehab setting. Physiatrist is providing close team supervision and 24 hour management of active medical problems listed below. Physiatrist and rehab team continue to assess barriers to discharge/monitor patient progress toward functional and medical goals.  Function:  Bathing Bathing position   Position: Shower  Bathing parts Body parts bathed  by patient: Right arm, Left arm, Chest, Abdomen, Front perineal area, Buttocks, Right upper leg, Left upper leg, Right lower leg, Left lower leg Body parts bathed by helper: Back  Bathing assist Assist Level: Touching or steadying assistance(Pt > 75%)      Upper Body Dressing/Undressing Upper body dressing   What is the patient wearing?: Pull over shirt/dress Bra - Perfomed by patient: Thread/unthread right bra strap, Thread/unthread left bra strap Bra - Perfomed by helper: Hook/unhook bra (pull down sports bra) Pull over shirt/dress - Perfomed by patient: Thread/unthread right sleeve, Put head through opening, Pull shirt over trunk Pull over shirt/dress - Perfomed by helper: Thread/unthread left sleeve        Upper body assist Assist Level: Touching or steadying assistance(Pt > 75%)      Lower Body Dressing/Undressing Lower body dressing   What is the patient wearing?: Pants Underwear - Performed by patient: Thread/unthread right underwear leg, Thread/unthread left underwear leg Underwear - Performed by helper: Pull underwear up/down Pants- Performed by patient: Thread/unthread right pants leg, Thread/unthread left pants leg Pants- Performed by helper: Pull pants up/down Non-skid slipper socks- Performed by patient: Don/doff right sock Non-skid slipper socks- Performed by helper: Don/doff left sock     Shoes - Performed by patient: Don/doff right shoe, Don/doff left shoe, Fasten right, Fasten left         TED Hose - Performed by helper: Don/doff right TED hose, Don/doff left TED hose  Lower body assist Assist for lower body  dressing: Touching or steadying assistance (Pt > 75%)      Toileting Toileting   Toileting steps completed by patient: Adjust clothing prior to toileting, Performs perineal hygiene, Adjust clothing after toileting Toileting steps completed by helper: Adjust clothing prior to toileting Toileting Assistive Devices: Grab bar or rail  Toileting assist  Assist level: Touching or steadying assistance (Pt.75%)   Transfers Chair/bed transfer   Chair/bed transfer method: Stand pivot Chair/bed transfer assist level: Moderate assist (Pt 50 - 74%/lift or lower) Chair/bed transfer assistive device: Patent attorneyWalker     Locomotion Ambulation     Max distance: 150 Assist level: Supervision or verbal cues   Wheelchair   Type: Manual Max wheelchair distance: 5325ft  Assist Level: Touching or steadying assistance (Pt > 75%)  Cognition Comprehension Comprehension assist level: Follows complex conversation/direction with no assist  Expression Expression assist level: Expresses basic needs/ideas: With no assist  Social Interaction Social Interaction assist level: Interacts appropriately 90% of the time - Needs monitoring or encouragement for participation or interaction.  Problem Solving Problem solving assist level: Solves complex 90% of the time/cues < 10% of the time  Memory Memory assist level: More than reasonable amount of time    Medical Problem List and Plan: 1.  Left hemiparesis secondary to right basal ganglia infarct secondary to small vessel disease on 11/12  Cont CIR, on hold today for holiday 2.  DVT Prophylaxis/Anticoagulation: Subcutaneous Lovenox. Monitor platelet counts and any signs of bleeding 3. Pain Management: Lyrica 75 mg twice a day 4. Mood: BuSpar 5 mg twice a day, melatonin 3 mg daily at bedtime resumed for sleep 5. Neuropsych: This patient is capable of making decisions on her own behalf. 6. Skin/Wound Care: loca care 7. Fluids/Electrolytes/Nutrition: encouragePO.  8. Sick sinus syndrome with pacemaker. Cardiac rate control 9. Hypertension:   Imdur increased to 60mg  daily 11/17  Toprol-XL 50 mg daily- increased to 100mg  daily 11/18  Ranexa 500 mg twice a day,   Norvasc 5 mg twice a day, increased 10 BID  Lisinopril 40 mg twice a day   Started Hydralazine 25 TID on 11/20, increased to 50 11/21  Extremely labile from  hypertensive crisis to hypotension per report, will need to monitor closely  Renal ultrasound ordered 11/20, remains pending 11/23 10. Diabetes mellitus with peripheral neuropathy. Hemoglobin A1c 5.2.SSI. Check blood sugars before meals and at bedtime.   Patient on Amaryl 2 mg twice daily at home--increased to 1mg  BID.  Overall still improving, will consider further increase if necessary 11. Hyperlipidemia. Fenofibrate 160 mg daily,Zetia 10 mg daily. 12. Constipation. Laxative assistance 13? CKD vs. AKI (likely CKD):   Cr 1.23 on 11/21  Labs ordered for tomorrow 14. UTI  -UA+, UCX 100k proteus mirabilis  -amoxil 11/17-11/24  -oob to void 15. Hyponatremia:  Na+ 131 on 11/21  Labs ordered for tomorrow  LOS (Days) 8 A FACE TO FACE EVALUATION WAS PERFORMED  Seraiah Nowack Karis Jubanil Allanah Mcfarland 10/04/2016 8:15 AM

## 2016-10-05 ENCOUNTER — Inpatient Hospital Stay (HOSPITAL_COMMUNITY): Payer: Medicare Other | Admitting: Occupational Therapy

## 2016-10-05 ENCOUNTER — Inpatient Hospital Stay (HOSPITAL_COMMUNITY): Payer: Medicare Other | Admitting: Speech Pathology

## 2016-10-05 ENCOUNTER — Inpatient Hospital Stay (HOSPITAL_COMMUNITY): Payer: Medicare Other | Admitting: Physical Therapy

## 2016-10-05 LAB — CBC WITH DIFFERENTIAL/PLATELET
Basophils Absolute: 0 10*3/uL (ref 0.0–0.1)
Basophils Relative: 0 %
EOS ABS: 0.1 10*3/uL (ref 0.0–0.7)
EOS PCT: 2 %
HCT: 46.4 % — ABNORMAL HIGH (ref 36.0–46.0)
Hemoglobin: 15.8 g/dL — ABNORMAL HIGH (ref 12.0–15.0)
LYMPHS ABS: 1.5 10*3/uL (ref 0.7–4.0)
LYMPHS PCT: 23 %
MCH: 29.8 pg (ref 26.0–34.0)
MCHC: 34.1 g/dL (ref 30.0–36.0)
MCV: 87.4 fL (ref 78.0–100.0)
MONO ABS: 0.4 10*3/uL (ref 0.1–1.0)
Monocytes Relative: 6 %
Neutro Abs: 4.4 10*3/uL (ref 1.7–7.7)
Neutrophils Relative %: 69 %
PLATELETS: 257 10*3/uL (ref 150–400)
RBC: 5.31 MIL/uL — AB (ref 3.87–5.11)
RDW: 14.1 % (ref 11.5–15.5)
WBC: 6.4 10*3/uL (ref 4.0–10.5)

## 2016-10-05 LAB — BASIC METABOLIC PANEL
Anion gap: 9 (ref 5–15)
BUN: 14 mg/dL (ref 6–20)
CHLORIDE: 94 mmol/L — AB (ref 101–111)
CO2: 29 mmol/L (ref 22–32)
CREATININE: 0.97 mg/dL (ref 0.44–1.00)
Calcium: 9.3 mg/dL (ref 8.9–10.3)
GFR calc Af Amer: 57 mL/min — ABNORMAL LOW (ref >60)
GFR, EST NON AFRICAN AMERICAN: 49 mL/min — AB (ref >60)
GLUCOSE: 135 mg/dL — AB (ref 65–99)
POTASSIUM: 4.5 mmol/L (ref 3.5–5.1)
SODIUM: 132 mmol/L — AB (ref 135–145)

## 2016-10-05 LAB — GLUCOSE, CAPILLARY
GLUCOSE-CAPILLARY: 148 mg/dL — AB (ref 65–99)
Glucose-Capillary: 244 mg/dL — ABNORMAL HIGH (ref 65–99)
Glucose-Capillary: 145 mg/dL — ABNORMAL HIGH (ref 65–99)
Glucose-Capillary: 124 mg/dL — ABNORMAL HIGH (ref 65–99)

## 2016-10-05 MED ORDER — FLEET ENEMA 7-19 GM/118ML RE ENEM: 1.0000 | ENEMA | Freq: Every day | RECTAL | Status: DC | PRN

## 2016-10-05 MED ORDER — GLIMEPIRIDE 2 MG PO TABS
2.0000 mg | ORAL_TABLET | Freq: Two times a day (BID) | ORAL | Status: DC
  Administered 2016-10-05 – 2016-10-11 (×12): 2 mg via ORAL
  Filled 2016-10-05 (×12): qty 1

## 2016-10-05 MED ORDER — SENNOSIDES-DOCUSATE SODIUM 8.6-50 MG PO TABS
2.0000 | ORAL_TABLET | Freq: Two times a day (BID) | ORAL | Status: DC
  Administered 2016-10-05 – 2016-10-11 (×12): 2 via ORAL
  Filled 2016-10-05 (×12): qty 2

## 2016-10-05 MED ORDER — BISACODYL 10 MG RE SUPP
10.0000 mg | Freq: Every day | RECTAL | Status: DC | PRN
  Administered 2016-10-05: 10 mg via RECTAL
  Filled 2016-10-05: qty 1

## 2016-10-05 NOTE — Progress Notes (Signed)
[place] PHYSICAL MEDICINE & REHABILITATION     PROGRESS NOTE    Subjective/Complaints: Pt seen sitting up in bed working with SLP.  She woke up at 3AM, but was able to go back to sleep.    ROS: Denies CP, SOB, N/V/D.   Objective: Vital Signs: Blood pressure (!) 159/58, pulse 61, temperature 97.5 F (36.4 C), temperature source Oral, resp. rate 16, height 5\' 2"  (1.575 m), weight 82.4 kg (181 lb 9.6 oz), SpO2 91 %. No results found.  Recent Labs  10/02/16 0914 10/05/16 0609  WBC 5.3 6.4  HGB 14.3 15.8*  HCT 43.7 46.4*  PLT 243 257    Recent Labs  10/02/16 0914 10/05/16 0609  NA 131* 132*  K 4.6 4.5  CL 93* 94*  GLUCOSE 327* 135*  BUN 17 14  CREATININE 1.23* 0.97  CALCIUM 9.0 9.3   CBG (last 3)   Recent Labs  10/04/16 1644 10/04/16 2105 10/05/16 0727  GLUCAP 145* 177* 148*    Wt Readings from Last 3 Encounters:  09/26/16 82.4 kg (181 lb 9.6 oz)  09/23/16 83 kg (183 lb)    Physical Exam:  Constitutional: No distress. Vital signs reviewed. Well developed.  HENT: [place]. AT Eyes: EOMI. No discharge.  Cardiovascular: RRR. No JVD.  Respiratory: CTA bilaterally. Unlabored.  GI: BS+. NT Musculoskeletal: She exhibits no tenderness.  Neurological: She is alert and oriented.  Motor: RUE: 4+/5 proximal to distal RLE: 4/5 proximal to distal LUE: 4/5 proximal to distal (stable) LLE: 4-/5 proximal to distal Skin: Skin  intact  Psychiatric: pleasant and coopeartive   Assessment/Plan: 1. Functional deficits secondary to right basal ganglia infarct which require 3+ hours per day of interdisciplinary therapy in a comprehensive inpatient rehab setting. Physiatrist is providing close team supervision and 24 hour management of active medical problems listed below. Physiatrist and rehab team continue to assess barriers to discharge/monitor patient progress toward functional and medical goals.  Function:  Bathing Bathing position   Position: Shower  Bathing parts  Body parts bathed by patient: Right arm, Left arm, Chest, Abdomen, Front perineal area, Buttocks, Right upper leg, Left upper leg, Right lower leg, Left lower leg Body parts bathed by helper: Back  Bathing assist Assist Level: Touching or steadying assistance(Pt > 75%)      Upper Body Dressing/Undressing Upper body dressing   What is the patient wearing?: Pull over shirt/dress Bra - Perfomed by patient: Thread/unthread right bra strap, Thread/unthread left bra strap Bra - Perfomed by helper: Hook/unhook bra (pull down sports bra) Pull over shirt/dress - Perfomed by patient: Thread/unthread right sleeve, Put head through opening, Pull shirt over trunk Pull over shirt/dress - Perfomed by helper: Thread/unthread left sleeve        Upper body assist Assist Level: Touching or steadying assistance(Pt > 75%)      Lower Body Dressing/Undressing Lower body dressing   What is the patient wearing?: Pants Underwear - Performed by patient: Thread/unthread right underwear leg, Thread/unthread left underwear leg Underwear - Performed by helper: Pull underwear up/down Pants- Performed by patient: Thread/unthread right pants leg, Thread/unthread left pants leg Pants- Performed by helper: Pull pants up/down Non-skid slipper socks- Performed by patient: Don/doff right sock Non-skid slipper socks- Performed by helper: Don/doff left sock     Shoes - Performed by patient: Don/doff right shoe, Don/doff left shoe, Fasten right, Fasten left         TED Hose - Performed by helper: Don/doff right TED hose, Don/doff left TED hose  Lower body assist Assist for lower body dressing: Touching or steadying assistance (Pt > 75%)      Toileting Toileting   Toileting steps completed by patient: Adjust clothing prior to toileting, Performs perineal hygiene, Adjust clothing after toileting Toileting steps completed by helper: Adjust clothing prior to toileting Toileting Assistive Devices: Grab bar or rail   Toileting assist Assist level: Touching or steadying assistance (Pt.75%)   Transfers Chair/bed transfer   Chair/bed transfer method: Stand pivot Chair/bed transfer assist level: Moderate assist (Pt 50 - 74%/lift or lower) Chair/bed transfer assistive device: Patent attorneyWalker     Locomotion Ambulation     Max distance: 150 Assist level: Supervision or verbal cues   Wheelchair   Type: Manual Max wheelchair distance: 6925ft  Assist Level: Touching or steadying assistance (Pt > 75%)  Cognition Comprehension Comprehension assist level: Follows complex conversation/direction with no assist  Expression Expression assist level: Expresses basic needs/ideas: With no assist  Social Interaction Social Interaction assist level: Interacts appropriately 90% of the time - Needs monitoring or encouragement for participation or interaction.  Problem Solving Problem solving assist level: Solves complex 90% of the time/cues < 10% of the time  Memory Memory assist level: More than reasonable amount of time    Medical Problem List and Plan: 1.  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  DVT Prophylaxis/Anticoagulation: Subcutaneous Lovenox. Monitor platelet counts and any signs of bleeding 3. Pain Management: Lyrica 75 mg twice a day 4. Mood: BuSpar 5 mg twice a day, melatonin 3 mg daily at bedtime resumed for sleep 5. Neuropsych: This patient is capable of making decisions on her own behalf. 6. Skin/Wound Care: loca care 7. Fluids/Electrolytes/Nutrition: encouragePO.  8. Sick sinus syndrome with pacemaker. Cardiac rate control 9. Hypertension:   Imdur increased to 60mg  daily 11/17  Toprol-XL 50 mg daily- increased to 100mg  daily 11/18  Ranexa 500 mg twice a day,   Norvasc 5 mg twice a day, increased 10 BID  Lisinopril 40 mg twice a day   Started Hydralazine 25 TID on 11/20, increased to 50 11/21  Overall improved, will consider  further increase if necessary.   Renal ultrasound ordered 11/20, remains pending 11/23 10. Diabetes mellitus with peripheral neuropathy. Hemoglobin A1c 5.2.SSI. Check blood sugars before meals and at bedtime.   Patient on Amaryl 2 mg twice daily at home--increased to 1mg  BID, increased to 2mg  BID on 11/24.  Will consider further increase if necessary 11. Hyperlipidemia. Fenofibrate 160 mg daily,Zetia 10 mg daily. 12. Constipation. Laxative assistance 13 ?CKD vs. ?AKI:   Cr 0.97 on 11/24 14. UTI  -UA+, UCX 100k proteus mirabilis  -amoxil 11/17-11/24  -oob to void 15. Hyponatremia:  Na+ 132 on 11/24  Possibly secondary to abd  LOS (Days) 9 A FACE TO FACE EVALUATION WAS PERFORMED  Lekisha Mcghee Karis Jubanil Janney Priego 10/05/2016 8:14 AM

## 2016-10-05 NOTE — Progress Notes (Signed)
Speech Language Pathology Session Note & Discharge Summary  Patient Details  Name: Charlene Greene MRN: [id number] Date of Birth: [birth year]  Today's Date: 10/05/2016 SLP Individual Time: 0715-0810 SLP Individual Time Calculation (min): 55 min   Skilled Therapeutic Interventions:  Skilled treatment session focused on dysphagia goals. SLP facilitated session by providing skilled observation with breakfast meal of regular textures and thin liquids. Patient consumed meal with intermittent subtle throat clearing that appeared to be related to potential reflux as it was observed to happen directly after belching. Patient also consumed medications whole with liquid with overt cough X 1, however, appeared to be related to head placement/positioning (head turned to the right). When patient's head remained in the neutral position, coughing was eliminated. Suspect patient's subtle coughing/throat clearing is baseline and patient is currently tolerating her current diet indicated by patient remaining afebrile with clear lung sounds. Therefore, patient will be discharged from skilled SLP intervention. Patient verbalized understanding and agreement. Patient left supine in bed with alarm on and all needs within reach.    Patient has met 2 of 2 long term goals.  Patient to discharge at overall Modified Independent level.   Reasons goals not met: N/A   Clinical Impression/Discharge Summary: Patient has made excellent gains and has met 2 of 2 LTG's due to improved swallowing function. Currently, patient is consuming regular textures with thin liquids with minimal overt s/s of aspiration and is Mod I for use of swallowing compensatory strategies. Patient's intermittent subtle coughing/throat clearing appears to be baseline and not directly related to dysphagia. Therefore, patient will be discharged from skilled SLP intervention. Patient verbalized understanding and agreement and f/u SLP services are not warranted at  this time.   Recommendation:  None      Equipment: N/A   Reasons for discharge: Treatment goals met   Patient/Family Agrees with Progress Made and Goals Achieved: Yes   Function:  Eating Eating   Modified Consistency Diet: No Eating Assist Level: Set up assist for;Swallowing techniques: self managed   Eating Set Up Assist For: Opening containers       Cognition Comprehension Comprehension assist level: Follows complex conversation/direction with no assist  Expression   Expression assist level: Expresses basic needs/ideas: With no assist  Social Interaction Social Interaction assist level: Interacts appropriately 90% of the time - Needs monitoring or encouragement for participation or interaction.  Problem Solving Problem solving assist level: Solves complex 90% of the time/cues < 10% of the time  Memory Memory assist level: More than reasonable amount of time   Barak Bialecki 10/05/2016, 8:19 AM

## 2016-10-05 NOTE — Progress Notes (Signed)
Physical Therapy Weekly Progress Note  Patient Details  Name: Charlene Greene MRN: [id number] Date of Birth: Apr 29, 1922  Beginning of progress report period: September 28, 2016 End of progress report period: October 05, 2016  Today's Date: 10/05/2016 PT Concurrent Time:  4259- 1430; 82 minutes  Patient has made steady progress and has met 2 of 4 short term goals.  Pt is currently min-mod A for bed mobility, sit <> stand and basic transfers depending on height of surface transferring from, supervision-min A for gait with rollator in controlled environment, and min-mod A for stair negotiation for LE strengthening.   Patient continues to demonstrate the following deficits: L sided weakness, impaired initiation, timing and sequencing, impaired postural control, balance, gait, increased falls risk and therefore will continue to benefit from skilled PT intervention to enhance overall performance with activity tolerance, balance, postural control, ability to compensate for deficits and functional use of  left upper extremity and left lower extremity.  Patient progressing toward long term goals..  Plan of care revisions: W/C mobility goal D/C; pt ambulatory on unit 150' with 4WW.; stair negotiation for LE strengthening goal downgraded to min A overall  PT Short Term Goals Week 1:  PT Short Term Goal 1 (Week 1): Pt will perform bed mobility on flat bed with supervision PT Short Term Goal 1 - Progress (Week 1): Not met PT Short Term Goal 2 (Week 1): Pt will perform sit <> stand and stand pivot transfers from w/c, bed or recliner with RW and min A PT Short Term Goal 2 - Progress (Week 1): Met PT Short Term Goal 3 (Week 1): Pt will perform gait x 100' with RW and min A in 8-10 minutes PT Short Term Goal 3 - Progress (Week 1): Met PT Short Term Goal 4 (Week 1): Pt will perform stair negotiation up/down 4 stairs x 2 reps with bilat UE support on rails and min A PT Short Term Goal 4 - Progress (Week 1): Not  met Week 2:  PT Short Term Goal 1 (Week 2): = LTG of Mod I for D/C on 11/30   Skilled Therapeutic Interventions/Progress Updates:  Pt received in recliner with family present; no c/o pain from patient.  Performed sit > stand from recliner with min A and ambulated with rollator to gym x 150' with supervision.  Vitals assessed after ambulation, WFL.  Performed UE and LE strengthening, coordination and endurance training on Nustep at level 4 resistance x 10 minutes with one minute break to assess HR.  Following Nustep pt reporting feeling lightheaded; vitals assessed again with systolic increasing but still WFL: 130/55, 65 bpm.  After resting pt performed stair negotiation up/down 4 stairs x 2 reps with bilat UE support with step to sequencing for LE strengthening with mod A due to LE weakness and difficulty advancing COG forwards.  Performed ambulation x 100' and performed simulated SUV transfer with supervision-min A and verbal cues for safe sequence. After rest break pt returned to room with RW and performed sit > supine on flat bed with rail with min-mod A.  Pt left in bed to rest with all items within reach.    Therapy Documentation Precautions:  Precautions Precautions: Fall Restrictions Weight Bearing Restrictions: No Vital Signs: Therapy Vitals Temp: 97.3 F (36.3 C) Temp Source: Oral Pulse Rate: (!) 59 Resp: 16 BP: (!) 136/57 Patient Position (if appropriate): Lying Oxygen Therapy SpO2: 96 % O2 Device: Not Delivered   See Function Navigator for Current Functional Status.  Therapy/Group:  Individual Therapy  Raylene Everts [facility] 10/05/2016, 4:27 PM

## 2016-10-05 NOTE — Progress Notes (Signed)
Occupational Therapy Weekly Progress Note  Patient Details  Name: Charlene Greene MRN: [id number] Date of Birth: [birth year]  Beginning of progress report period: 09/27/16 End of progress report period: 10/05/16 Today's Date: 10/05/2016 OT Individual Time: 9122-5834 OT Individual Time Calculation (min): 50 min    Patient has met 4 of 4 short term goals.    Patient continues to demonstrate the following deficits: activity tolerance, L sided weakness, standing balance/endurance and therefore will continue to benefit from skilled OT intervention to enhance overall performance with BADL.  Pt has improved UE coordination, L UE strength, balance, safety awareness, and standing endurance since time of last report as evidenced by STG achievement. Therapy will continue to focus on L UE functional remediation as well as further improving balance and safety awareness for safe discharge to her independent living facility.   Patient progressing toward long term goals..  Continue plan of care.  OT Short Term Goals Week 2:  OT Short Term Goal 1 (Week 2): STGs=LTGs due to ELOS   Skilled Therapeutic Interventions/Progress Updates:   Pt was lying in bed at time of arrival, agreeable to complete dressing with simulated home setup. Pt ambulated with rollator to w/c with close supervision and instruction on locking brakes during sit<stand. UB/LB dressing completed with overall supervision today(!) in w/c without use of arm rests for sit<stands due to pt having armless chair in ILF in which she completes dressing. Pt then ambulated to sink to complete oral care/grooming/makeup application with supervision and pt needing cues to turn off faucet. Instruction provided on using rollator as seat for energy conservation with good carryover of education. Afterwards pt ambulated to recliner and was repositioned for comfort. Pt left with all needs within reach at time of departure.   Therapy Documentation Precautions:   Precautions Precautions: Fall Restrictions Weight Bearing Restrictions: No  Pain: No c/o pain during session   :    See Function Navigator for Current Functional Status.  Therapy/Group: Individual Therapy  [name] 10/05/2016, 12:44 PM

## 2016-10-06 ENCOUNTER — Inpatient Hospital Stay (HOSPITAL_COMMUNITY): Payer: Medicare Other | Admitting: Physical Therapy

## 2016-10-06 ENCOUNTER — Inpatient Hospital Stay (HOSPITAL_COMMUNITY): Payer: Medicare Other | Admitting: Occupational Therapy

## 2016-10-06 ENCOUNTER — Encounter (HOSPITAL_COMMUNITY): Payer: Medicare Other

## 2016-10-06 LAB — GLUCOSE, CAPILLARY
GLUCOSE-CAPILLARY: 89 mg/dL (ref 65–99)
Glucose-Capillary: 148 mg/dL — ABNORMAL HIGH (ref 65–99)
Glucose-Capillary: 176 mg/dL — ABNORMAL HIGH (ref 65–99)
Glucose-Capillary: 150 mg/dL — ABNORMAL HIGH (ref 65–99)

## 2016-10-06 MED ORDER — NIACIN ER 250 MG PO CPCR
250.0000 mg | ORAL_CAPSULE | Freq: Every day | ORAL | Status: DC
  Administered 2016-10-07 – 2016-10-10 (×4): 250 mg via ORAL
  Filled 2016-10-06 (×4): qty 1

## 2016-10-06 NOTE — Progress Notes (Addendum)
Occupational Therapy Session Note  Patient Details  Name: Charlene Greene MRN: [id number] Date of Birth: [birth year]  Today's Date: 10/06/2016 OT IndividualTime:1430-1545 75 min  Short Term Goals: Week 1:  OT Short Term Goal 1 (Week 1): Pt will engaged in 10 minutes of functional activity with less than 2 rest breaks secondary to fatigue. OT Short Term Goal 2 (Week 1): Pt will perform LB dressing with min A in order to increase I with self care. OT Short Term Goal 3 (Week 1): Pt will perform toileting with min A in order to increase I with self care. OT Short Term Goal 4 (Week 1): Pt will perform UB dressing with set up A in order to increase I with LB self care.  Skilled Therapeutic Interventions/Progress Updates:   Therapeutic activity with focus on improved attention, visual scanning, standing balance, LUE AROM and left hand FMC to improve performance in BADL.  Assessed Grip/Pinch strength as follows:  Grip: R=35 lbs, L=22 lbs  (per [employer] Dyn[employer])  Pinch  R L Key  10 lbs 9 lbs Palmar  9 9 Tip  7 8  [facility] and completed 2 games of GoBan standing, using only left hand with instructional cues, and intermittent questioning cues to attend to left and problem-solve.   Pt required rest break after first game of Goban, approx 3 minutes.   Pt then performed 10 minutes of dynavision seated, using only left hand while seated in wheelchair and attending to right lower quadrant only to enable adequate reach from w/c height.   Pt improved average reaction time from 1.97 sec reaction time to 1.54 during session.   Pt was escorted back to her room and was re-educated on use of theraputty with written HEP provided.   [facility] and both were placed in Patient Handbook at end of session.   Pt recovered to recliner with steadying assist during stand-pivot transfer with all needs placed within reach.  Therapy Documentation Precautions:   Precautions Precautions: Fall Restrictions Weight Bearing Restrictions: No  Vital Signs: Therapy Vitals Temp: 98.8 F (37.1 C) Temp Source: Oral Pulse Rate: 61 BP: (!) 146/65 Patient Position (if appropriate): Lying Oxygen Therapy SpO2: 97 % O2 Device: Not Delivered   Pain: No/denies pain    See Function Navigator for Current Functional Status.   Therapy/Group: IndividualTherapy  Ethelda Deangelo 10/06/2016, 7:00 AM

## 2016-10-06 NOTE — Progress Notes (Signed)
Occupational Therapy Session Note  Patient Details  Name: Charlene Greene MRN: [id number] Date of Birth: 1921-11-27  Today's Date: 10/06/2016 OT Individual Time: [id number] and 1100-1155 OT Individual Time Calculation (min): 61 min and 55 min    Short Term Goals: Week 2:  OT Short Term Goal 1 (Week 2): STGs=LTGs due to ELOS  Skilled Therapeutic Interventions/Progress Updates:    1) Treatment session with focus on functional mobility and functional use of BUE during self-care tasks of bathing and dressing.  Pt ambulated to room shower with Rollator and min cues for transfer technique into walk-in shower.  Pt completed bathing at overall supervision level at sit > stand with use of long handled sponge to reach feet.  Pt reports increased fatigue post shower this session.  Completed dressing at sit > stand level with supervision for LB dressing even donning bedroom slippers with increased time, but able to maintain LLE over Rt knee to don shoe.  Pt required assist with donning bra and shirt this session.  Discussed alternative methods as well as hemi- technique with donning bra and shirt.  Pt completed grooming tasks in sitting due to fatigue.  Transferred back to bed and left semi-reclined in bed at end of session.  2) Treatment session with focus on activity tolerance, standing balance, and functional use of BUE.  Pt received in bed reporting fatigue but willing to participate.  Ambulated to therapy gym approx 130 with Rollator at overall supervision.  Engaged in table top task with pt selected Bananagrams with focus on BUE coordination and functional use to flip over tiles while incorporating cognitive challenge of creating words from letter tiles.  Engaged in task while standing for 2-3 mins before pt requesting seated rest break, due to fatigue.  Noted pt to be red faced and reporting mildly light headed, assessed vitals to be in normal range for pt.  Notified RN.  As session progressed pt redness  decreased.  Per RN, may have been a side effect of specific medication (Niacin).  Returned to room via w/c due to fatigue and left upright in w/c with meal tray setup and son present.  Therapy Documentation Precautions:  Precautions Precautions: Fall Restrictions Weight Bearing Restrictions: No General:   Vital Signs: Therapy Vitals Pulse Rate: (!) 59 BP: (!) 162/57 Pain:  Pt with no c/o pain  See Function Navigator for Current Functional Status.   Therapy/Group: Individual Therapy  Charlene Greene, Charlene Greene 10/06/2016, 10:16 AM

## 2016-10-06 NOTE — Progress Notes (Signed)
[facility] PHYSICAL MEDICINE & REHABILITATION     PROGRESS NOTE    Subjective/Complaints: Pt sitting up at the edge of her bed eating breakfast.  She slept well overnight.     ROS: Denies CP, SOB, N/V/D.   Objective: Vital Signs: Blood pressure (!) 146/65, pulse 61, temperature 98.8 F (37.1 C), temperature source Oral, resp. rate 16, height 5\' 2"  (1.575 m), weight 82.4 kg (181 lb 9.6 oz), SpO2 97 %. No results found.  Recent Labs  10/05/16 0609  WBC 6.4  HGB 15.8*  HCT 46.4*  PLT 257    Recent Labs  10/05/16 0609  NA 132*  K 4.5  CL 94*  GLUCOSE 135*  BUN 14  CREATININE 0.97  CALCIUM 9.3   CBG (last 3)   Recent Labs  10/05/16 1611 10/05/16 2039 10/06/16 0635  GLUCAP 145* 124* 89    Wt Readings from Last 3 Encounters:  09/26/16 82.4 kg (181 lb 9.6 oz)  09/23/16 83 kg (183 lb)    Physical Exam:  Constitutional: NAD. Vital signs reviewed. Well developed.  HENT: [place]. AT Eyes: EOMI. No discharge.  Cardiovascular: RRR. No JVD.  Respiratory: CTA bilaterally. Unlabored.  GI: BS+. NT Musculoskeletal: She exhibits no tenderness.  Neurological: She is alert and oriented.  Motor: RUE: 4+/5 proximal to distal RLE: 4/5 proximal to distal LUE: 4/5 proximal to distal (stable) LLE: 4-/5 proximal to distal (stable) Skin: Skin  intact  Psychiatric: pleasant and coopeartive   Assessment/Plan: 1. Functional deficits secondary to right basal ganglia infarct which require 3+ hours per day of interdisciplinary therapy in a comprehensive inpatient rehab setting. Physiatrist is providing close team supervision and 24 hour management of active medical problems listed below. Physiatrist and rehab team continue to assess barriers to discharge/monitor patient progress toward functional and medical goals.  Function:  Bathing Bathing position   Position: Shower  Bathing parts Body parts bathed by patient: Right arm, Left arm, Chest, Abdomen, Front perineal area,  Buttocks, Right upper leg, Left upper leg, Right lower leg, Left lower leg Body parts bathed by helper: Back  Bathing assist Assist Level: Touching or steadying assistance(Pt > 75%)      Upper Body Dressing/Undressing Upper body dressing   What is the patient wearing?: Pull over shirt/dress, Bra Bra - Perfomed by patient: Thread/unthread right bra strap, Thread/unthread left bra strap, Hook/unhook bra (pull down sports bra) Bra - Perfomed by helper: Hook/unhook bra (pull down sports bra) Pull over shirt/dress - Perfomed by patient: Thread/unthread right sleeve, Thread/unthread left sleeve, Put head through opening, Pull shirt over trunk Pull over shirt/dress - Perfomed by helper: Thread/unthread left sleeve        Upper body assist Assist Level: Supervision or verbal cues      Lower Body Dressing/Undressing Lower body dressing   What is the patient wearing?: Pants, Socks, Shoes, Underwear Underwear - Performed by patient: Thread/unthread right underwear leg, Thread/unthread left underwear leg, Pull underwear up/down Underwear - Performed by helper: Pull underwear up/down Pants- Performed by patient: Thread/unthread right pants leg, Thread/unthread left pants leg, Pull pants up/down Pants- Performed by helper: Pull pants up/down Non-skid slipper socks- Performed by patient: Don/doff right sock Non-skid slipper socks- Performed by helper: Don/doff left sock Socks - Performed by patient: Don/doff right sock, Don/doff left sock   Shoes - Performed by patient: Don/doff right shoe, Don/doff left shoe, Fasten right, Fasten left         TED Hose - Performed by helper: Don/doff right TED  hose, Don/doff left TED hose  Lower body assist Assist for lower body dressing: Supervision or verbal cues      Toileting Toileting   Toileting steps completed by patient: Performs perineal hygiene, Adjust clothing after toileting Toileting steps completed by helper: Adjust clothing prior to  toileting Toileting Assistive Devices: Grab bar or rail  Toileting assist Assist level: Touching or steadying assistance (Pt.75%)   Transfers Chair/bed transfer   Chair/bed transfer method: Ambulatory, Stand pivot Chair/bed transfer assist level: Supervision or verbal cues Chair/bed transfer assistive device: Walker     Locomotion Ambulation     Max distance: 150 AssiPatent attorneyst level: Supervision or verbal cues   Wheelchair   Type: Manual Max wheelchair distance: 1225ft  Assist Level: Touching or steadying assistance (Pt > 75%)  Cognition Comprehension Comprehension assist level: Follows complex conversation/direction with extra time/assistive device  Expression Expression assist level: Expresses basic needs/ideas: With no assist  Social Interaction Social Interaction assist level: Interacts appropriately 90% of the time - Needs monitoring or encouragement for participation or interaction.  Problem Solving Problem solving assist level: Solves basic 90% of the time/requires cueing < 10% of the time  Memory Memory assist level: More than reasonable amount of time    Medical Problem List and Plan: 1.  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  DVT Prophylaxis/Anticoagulation: Subcutaneous Lovenox. Monitor platelet counts and any signs of bleeding 3. Pain Management: Lyrica 75 mg twice a day 4. Mood: BuSpar 5 mg twice a day, melatonin 3 mg daily at bedtime resumed for sleep 5. Neuropsych: This patient is capable of making decisions on her own behalf. 6. Skin/Wound Care: loca care 7. Fluids/Electrolytes/Nutrition: encouragePO.  8. Sick sinus syndrome with pacemaker. Cardiac rate control 9. Hypertension:   Imdur increased to 60mg  daily 11/17  Toprol-XL 50 mg daily- increased to 100mg  daily 11/18  Ranexa 500 mg twice a day,   Norvasc 5 mg twice a day, increased 10 BID  Lisinopril 40 mg twice a day   Started  Hydralazine 25 TID on 11/20, increased to 50 11/21  Labile, but overall improved, will consider further increase if necessary.   Renal ultrasound ordered 11/20, remains pending 11/25 10. Diabetes mellitus with peripheral neuropathy. Hemoglobin A1c 5.2.SSI. Check blood sugars before meals and at bedtime.   Patient on Amaryl 2 mg twice daily at home--increased to 1mg  BID, increased to 2mg  BID on 11/24.  Improving  Will consider further increase if necessary 11. Hyperlipidemia. Fenofibrate 160 mg daily,Zetia 10 mg daily. 12. Constipation. Laxative assistance 13 ?CKD vs. ?AKI:   Cr 0.97 on 11/24 14. UTI  -UA+, UCX 100k proteus mirabilis  -amoxil 11/17-11/24  -oob to void 15. Hyponatremia:  Na+ 132 on 11/24  Possibly secondary to abx  LOS (Days) 10 A FACE TO FACE EVALUATION WAS PERFORMED  Ankit Karis Jubanil Patel 10/06/2016 7:52 AM

## 2016-10-07 ENCOUNTER — Inpatient Hospital Stay (HOSPITAL_COMMUNITY): Payer: Medicare Other | Admitting: Physical Therapy

## 2016-10-07 ENCOUNTER — Inpatient Hospital Stay (HOSPITAL_COMMUNITY): Payer: Medicare Other | Admitting: Occupational Therapy

## 2016-10-07 LAB — GLUCOSE, CAPILLARY
GLUCOSE-CAPILLARY: 75 mg/dL (ref 65–99)
GLUCOSE-CAPILLARY: 159 mg/dL — AB (ref 65–99)
Glucose-Capillary: 175 mg/dL — ABNORMAL HIGH (ref 65–99)
Glucose-Capillary: 146 mg/dL — ABNORMAL HIGH (ref 65–99)

## 2016-10-07 MED ORDER — HYDRALAZINE HCL 50 MG PO TABS
75.0000 mg | ORAL_TABLET | Freq: Three times a day (TID) | ORAL | Status: DC
  Administered 2016-10-07 – 2016-10-11 (×11): 75 mg via ORAL
  Filled 2016-10-07 (×12): qty 1

## 2016-10-07 NOTE — Progress Notes (Signed)
Physical Therapy Session Note  Patient Details  Name: Charlene Greene MRN: [id number] Date of Birth: [birth year]  Today's Date: 10/07/2016 PT Individual Time: 1453-1539 PT Individual Time Calculation (min): 46 min    Short Term Goals: Week 2:  PT Short Term Goal 1 (Week 2): = LTG of Mod I for D/C on 11/30  Skilled Therapeutic Interventions/Progress Updates:    Pt received in recliner & agreeable to tx, denying c/o pain. Vitals assessed at rest: HR = 58 bpm, SpO2 = 95%, BP = 118/60 mmHg. Pt ambulated room<>gym with rollator & supervision with decreased gait speed. Educated pt on management of rollator & brakes and proper hand placement with sit<>stand transfers. Pt with significant posterior lean, fear of falling, and supports BLE on mat table to assist with standing. Pt performed multiple sit<>Stand transfers from 20 inch mat table without BUE, max multimodal cuing for anterior weight shift and max assist for sit<>stand. Utilized cybex kinetron in sitting, up to 20 cm/sec focusing on hamstring strengthening to assist with sit<>Stand transfers. Pt ambulated back to room with rollator & transferred into bed with supervision, cuing to scoot to head of bed with bed positioned in trendelenburg, and use of bed rails. Pt left in bed with alarm set & all needs within reach.   Therapy Documentation Precautions:  Precautions Precautions: Fall Restrictions Weight Bearing Restrictions: No   See Function Navigator for Current Functional Status.   Therapy/Group: Individual Therapy  Sandi MariscalVictoria M Sherle Mello 10/07/2016, 3:53 PM

## 2016-10-07 NOTE — Progress Notes (Signed)
[facility] PHYSICAL MEDICINE & REHABILITATION     PROGRESS NOTE    Subjective/Complaints: Pt seen laying in bed this AM.  She slept well overnight.  She does not have any complaints this AM.    ROS: Denies CP, SOB, N/V/D.   Objective: Vital Signs: Blood pressure (!) 156/50, pulse 61, temperature 97.5 F (36.4 C), temperature source Oral, resp. rate 16, height 5\' 2"  (1.575 m), weight 82.4 kg (181 lb 9.6 oz), SpO2 93 %. No results found.  Recent Labs  10/05/16 0609  WBC 6.4  HGB 15.8*  HCT 46.4*  PLT 257    Recent Labs  10/05/16 0609  NA 132*  K 4.5  CL 94*  GLUCOSE 135*  BUN 14  CREATININE 0.97  CALCIUM 9.3   CBG (last 3)   Recent Labs  10/06/16 1641 10/06/16 2035 10/07/16 0635  GLUCAP 176* 150* 75    Wt Readings from Last 3 Encounters:  09/26/16 82.4 kg (181 lb 9.6 oz)  09/23/16 83 kg (183 lb)    Physical Exam:  Constitutional: NAD. Vital signs reviewed. Well developed.  HENT: [place]. AT Eyes: EOMI. No discharge.  Cardiovascular: RRR. No JVD.  Respiratory: CTA bilaterally. Unlabored.  GI: BS+. NT Musculoskeletal: She exhibits no tenderness.  Neurological: She is alert and oriented.  Motor: RUE: 4+/5 proximal to distal RLE: 4/5 proximal to distal LUE: 4/5 proximal to distal (stable) LLE: 4/5 proximal to distal  Skin: Skin  intact  Psychiatric: pleasant and coopeartive   Assessment/Plan: 1. Functional deficits secondary to right basal ganglia infarct which require 3+ hours per day of interdisciplinary therapy in a comprehensive inpatient rehab setting. Physiatrist is providing close team supervision and 24 hour management of active medical problems listed below. Physiatrist and rehab team continue to assess barriers to discharge/monitor patient progress toward functional and medical goals.  Function:  Bathing Bathing position   Position: Shower  Bathing parts Body parts bathed by patient: Right arm, Left arm, Chest, Abdomen, Front perineal area,  Buttocks, Right upper leg, Left upper leg, Right lower leg, Left lower leg Body parts bathed by helper: Back  Bathing assist Assist Level: Supervision or verbal cues, Set up   Set up : To obtain items  Upper Body Dressing/Undressing Upper body dressing   What is the patient wearing?: Bra, Pull over shirt/dress Bra - Perfomed by patient: Thread/unthread right bra strap, Thread/unthread left bra strap Bra - Perfomed by helper: Hook/unhook bra (pull down sports bra) Pull over shirt/dress - Perfomed by patient: Thread/unthread right sleeve, Put head through opening, Pull shirt over trunk Pull over shirt/dress - Perfomed by helper: Thread/unthread left sleeve        Upper body assist Assist Level:  (Mod assist)      Lower Body Dressing/Undressing Lower body dressing   What is the patient wearing?: Pants, Shoes, Underwear Underwear - Performed by patient: Thread/unthread right underwear leg, Thread/unthread left underwear leg, Pull underwear up/down Underwear - Performed by helper: Pull underwear up/down Pants- Performed by patient: Thread/unthread right pants leg, Thread/unthread left pants leg, Pull pants up/down Pants- Performed by helper: Pull pants up/down Non-skid slipper socks- Performed by patient: Don/doff right sock Non-skid slipper socks- Performed by helper: Don/doff left sock Socks - Performed by patient: Don/doff right sock, Don/doff left sock   Shoes - Performed by patient: Don/doff right shoe, Don/doff left shoe         TED Hose - Performed by helper: Don/doff right TED hose, Don/doff left TED hose  Lower  body assist Assist for lower body dressing: Supervision or verbal cues      Toileting Toileting   Toileting steps completed by patient: Performs perineal hygiene, Adjust clothing after toileting Toileting steps completed by helper: Adjust clothing prior to toileting Toileting Assistive Devices: Grab bar or rail  Toileting assist Assist level: Touching or  steadying assistance (Pt.75%)   Transfers Chair/bed transfer   Chair/bed transfer method: Ambulatory Chair/bed transfer assist level: Supervision or verbal cues Chair/bed transfer assistive device: Patent attorneyWalker     Locomotion Ambulation     Max distance: 150 Assist level: Supervision or verbal cues   Wheelchair   Type: Manual Max wheelchair distance: 3125ft  Assist Level: Touching or steadying assistance (Pt > 75%)  Cognition Comprehension Comprehension assist level: Follows complex conversation/direction with extra time/assistive device  Expression Expression assist level: Expresses basic needs/ideas: With no assist  Social Interaction Social Interaction assist level: Interacts appropriately with others - No medications needed.  Problem Solving Problem solving assist level: Solves complex 90% of the time/cues < 10% of the time  Memory Memory assist level: More than reasonable amount of time    Medical Problem List and Plan: 1.  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  DVT Prophylaxis/Anticoagulation: Subcutaneous Lovenox. Monitor platelet counts and any signs of bleeding 3. Pain Management: Lyrica 75 mg twice a day 4. Mood: BuSpar 5 mg twice a day, melatonin 3 mg daily at bedtime resumed for sleep 5. Neuropsych: This patient is capable of making decisions on her own behalf. 6. Skin/Wound Care: loca care 7. Fluids/Electrolytes/Nutrition: encouragePO.  8. Sick sinus syndrome with pacemaker. Cardiac rate control 9. Hypertension:   Imdur increased to 60mg  daily 11/17  Toprol-XL 50 mg daily- increased to 100mg  daily 11/18  Ranexa 500 mg twice a day,   Norvasc 5 mg twice a day, increased 10 BID  Lisinopril 40 mg twice a day   Started Hydralazine 25 TID on 11/20, increased to 50 11/21, increased to 75 on 11/26  Will consider further increase if necessary.   Renal ultrasound ordered 11/20, remains pending  11/26 10. Diabetes mellitus with peripheral neuropathy. Hemoglobin A1c 5.2.SSI. Check blood sugars before meals and at bedtime.   Patient on Amaryl 2 mg twice daily at home--increased to 1mg  BID, increased to 2mg  BID on 11/24.  Overall improving  Will consider further increase if necessary 11. Hyperlipidemia. Fenofibrate 160 mg daily,Zetia 10 mg daily. 12. Constipation. Laxative assistance 13 ?CKD vs. ?AKI:   Cr 0.97 on 11/24  Labs ordered for tomorrow 14. UTI  -UA+, UCX 100k proteus mirabilis  -Completed amoxil x 7 days 11/24.  -oob to void 15. Hyponatremia:  Na+ 132 on 11/24  Possibly secondary to abx  Labs ordered for tomorrow  LOS (Days) 11 A FACE TO FACE EVALUATION WAS PERFORMED  Kamoni Depree Karis Jubanil Kambrea Carrasco 10/07/2016 7:26 AM

## 2016-10-08 ENCOUNTER — Inpatient Hospital Stay (HOSPITAL_COMMUNITY): Payer: Medicare Other | Admitting: Occupational Therapy

## 2016-10-08 ENCOUNTER — Inpatient Hospital Stay (HOSPITAL_COMMUNITY): Payer: Medicare Other

## 2016-10-08 ENCOUNTER — Inpatient Hospital Stay (HOSPITAL_COMMUNITY): Payer: Medicare Other | Admitting: Physical Therapy

## 2016-10-08 DIAGNOSIS — I10 Essential (primary) hypertension: Secondary | ICD-10-CM

## 2016-10-08 LAB — CBC WITH DIFFERENTIAL/PLATELET
BASOS PCT: 1 %
Basophils Absolute: 0 10*3/uL (ref 0.0–0.1)
Eosinophils Absolute: 0.1 10*3/uL (ref 0.0–0.7)
Eosinophils Relative: 2 %
HEMATOCRIT: 42.7 % (ref 36.0–46.0)
HEMOGLOBIN: 14.8 g/dL (ref 12.0–15.0)
LYMPHS ABS: 1.2 10*3/uL (ref 0.7–4.0)
Lymphocytes Relative: 28 %
MCH: 29.5 pg (ref 26.0–34.0)
MCHC: 34.7 g/dL (ref 30.0–36.0)
MCV: 85.1 fL (ref 78.0–100.0)
MONOS PCT: 11 %
Monocytes Absolute: 0.5 10*3/uL (ref 0.1–1.0)
NEUTROS ABS: 2.4 10*3/uL (ref 1.7–7.7)
NEUTROS PCT: 58 %
Platelets: 254 10*3/uL (ref 150–400)
RBC: 5.02 MIL/uL (ref 3.87–5.11)
RDW: 13.6 % (ref 11.5–15.5)
WBC: 4.1 10*3/uL (ref 4.0–10.5)

## 2016-10-08 LAB — BASIC METABOLIC PANEL
ANION GAP: 8 (ref 5–15)
BUN: 13 mg/dL (ref 6–20)
CHLORIDE: 90 mmol/L — AB (ref 101–111)
CO2: 30 mmol/L (ref 22–32)
Calcium: 9 mg/dL (ref 8.9–10.3)
Creatinine, Ser: 0.93 mg/dL (ref 0.44–1.00)
GFR calc non Af Amer: 52 mL/min — ABNORMAL LOW (ref >60)
GFR, EST AFRICAN AMERICAN: 60 mL/min — AB (ref >60)
Glucose, Bld: 95 mg/dL (ref 65–99)
POTASSIUM: 4.3 mmol/L (ref 3.5–5.1)
Sodium: 128 mmol/L — ABNORMAL LOW (ref 135–145)

## 2016-10-08 LAB — GLUCOSE, CAPILLARY
GLUCOSE-CAPILLARY: 183 mg/dL — AB (ref 65–99)
GLUCOSE-CAPILLARY: 171 mg/dL — AB (ref 65–99)
Glucose-Capillary: 83 mg/dL (ref 65–99)
Glucose-Capillary: 67 mg/dL (ref 65–99)

## 2016-10-08 NOTE — Progress Notes (Signed)
[facility] PHYSICAL MEDICINE & REHABILITATION     PROGRESS NOTE    Subjective/Complaints: Pt seen sitting up in bed this AM.  She slept well overnight. She is doing well and happy with her progress.   ROS: Denies CP, SOB, N/V/D.   Objective: Vital Signs: Blood pressure (!) 145/61, pulse 63, temperature 98.2 F (36.8 C), temperature source Oral, resp. rate 18, height 5\' 2"  (1.575 m), weight 82.4 kg (181 lb 9.6 oz), SpO2 93 %. No results found.  Recent Labs  10/08/16 0622  WBC 4.1  HGB 14.8  HCT 42.7  PLT 254    Recent Labs  10/08/16 0622  NA 128*  K 4.3  CL 90*  GLUCOSE 95  BUN 13  CREATININE 0.93  CALCIUM 9.0   CBG (last 3)   Recent Labs  10/07/16 1627 10/07/16 2135 10/08/16 0622  GLUCAP 175* 146* 83    Wt Readings from Last 3 Encounters:  09/26/16 82.4 kg (181 lb 9.6 oz)  09/23/16 83 kg (183 lb)    Physical Exam:  Constitutional: NAD. Vital signs reviewed. Well developed.  HENT: [place]. AT Eyes: EOMI. No discharge.  Cardiovascular: RRR. No JVD.  Respiratory: CTA bilaterally. Unlabored.  GI: BS+. NT Musculoskeletal: She exhibits no tenderness.  Neurological: She is alert and oriented.  Motor: RUE: 4+/5 proximal to distal RLE: 4/5 proximal to distal LUE: Shoulder abduction 3-/5, elbow flex/ext 4/5, hand grip 4+/5  LLE: 4/5 proximal to distal  Skin: Skin  intact  Psychiatric: pleasant and coopeartive   Assessment/Plan: 1. Functional deficits secondary to right basal ganglia infarct which require 3+ hours per day of interdisciplinary therapy in a comprehensive inpatient rehab setting. Physiatrist is providing close team supervision and 24 hour management of active medical problems listed below. Physiatrist and rehab team continue to assess barriers to discharge/monitor patient progress toward functional and medical goals.  Function:  Bathing Bathing position   Position: Shower  Bathing parts Body parts bathed by patient: Right arm, Left arm,  Chest, Abdomen, Front perineal area, Buttocks, Right upper leg, Left upper leg, Right lower leg, Left lower leg Body parts bathed by helper: Back  Bathing assist Assist Level: Supervision or verbal cues, Set up   Set up : To obtain items  Upper Body Dressing/Undressing Upper body dressing   What is the patient wearing?: Bra, Pull over shirt/dress Bra - Perfomed by patient: Thread/unthread right bra strap, Thread/unthread left bra strap Bra - Perfomed by helper: Hook/unhook bra (pull down sports bra) Pull over shirt/dress - Perfomed by patient: Thread/unthread right sleeve, Put head through opening, Pull shirt over trunk Pull over shirt/dress - Perfomed by helper: Thread/unthread left sleeve        Upper body assist Assist Level:  (Mod assist)      Lower Body Dressing/Undressing Lower body dressing   What is the patient wearing?: Pants, Shoes, Underwear Underwear - Performed by patient: Thread/unthread right underwear leg, Thread/unthread left underwear leg, Pull underwear up/down Underwear - Performed by helper: Pull underwear up/down Pants- Performed by patient: Thread/unthread right pants leg, Thread/unthread left pants leg, Pull pants up/down Pants- Performed by helper: Pull pants up/down Non-skid slipper socks- Performed by patient: Don/doff right sock Non-skid slipper socks- Performed by helper: Don/doff left sock Socks - Performed by patient: Don/doff right sock, Don/doff left sock   Shoes - Performed by patient: Don/doff right shoe, Don/doff left shoe         TED Hose - Performed by helper: Don/doff right TED hose, Don/doff  left TED hose  Lower body assist Assist for lower body dressing: Supervision or verbal cues      Toileting Toileting   Toileting steps completed by patient: Performs perineal hygiene, Adjust clothing after toileting Toileting steps completed by helper: Adjust clothing prior to toileting Toileting Assistive Devices: Grab bar or rail  Toileting  assist Assist level: Touching or steadying assistance (Pt.75%)   Transfers Chair/bed transfer   Chair/bed transfer method: Ambulatory Chair/bed transfer assist level: Supervision or verbal cues Chair/bed transfer assistive device: Patent attorneyWalker     Locomotion Ambulation     Max distance: 150 ft Assist level: Supervision or verbal cues   Wheelchair   Type: Manual Max wheelchair distance: 2425ft  Assist Level: Touching or steadying assistance (Pt > 75%)  Cognition Comprehension Comprehension assist level: Follows complex conversation/direction with extra time/assistive device  Expression Expression assist level: Expresses basic needs/ideas: With no assist  Social Interaction Social Interaction assist level: Interacts appropriately 90% of the time - Needs monitoring or encouragement for participation or interaction.  Problem Solving Problem solving assist level: Solves basic 90% of the time/requires cueing < 10% of the time  Memory Memory assist level: More than reasonable amount of time    Medical Problem List and Plan: 1.  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  DVT Prophylaxis/Anticoagulation: Subcutaneous Lovenox. Monitor platelet counts and any signs of bleeding 3. Pain Management: Lyrica 75 mg twice a day 4. Mood: BuSpar 5 mg twice a day, melatonin 3 mg daily at bedtime resumed for sleep 5. Neuropsych: This patient is capable of making decisions on her own behalf. 6. Skin/Wound Care: loca care 7. Fluids/Electrolytes/Nutrition: encouragePO.  8. Sick sinus syndrome with pacemaker. Cardiac rate control 9. Hypertension:   Imdur increased to 60mg  daily 11/17  Toprol-XL 50 mg daily- increased to 100mg  daily 11/18  Ranexa 500 mg twice a day,   Norvasc 5 mg twice a day, increased 10 BID  Lisinopril 40 mg twice a day   Started Hydralazine 25 TID on 11/20, increased to 50 11/21, increased to 75 on 11/26  Improving,  will consider further increase if necessary.   Renal ultrasound ordered 11/20, remains pending 11/27 10. Diabetes mellitus with peripheral neuropathy. Hemoglobin A1c 5.2.SSI. Check blood sugars before meals and at bedtime.   Patient on Amaryl 2 mg twice daily at home--increased to 1mg  BID, increased to 2mg  BID on 11/24.  Overall improving 11/27  Will consider further increase if necessary 11. Hyperlipidemia. Fenofibrate 160 mg daily,Zetia 10 mg daily. 12. Constipation. Laxative assistance 13 AKI: Resolved  Cr 0.93 on 11/27 14. UTI  -UA+, UCX 100k proteus mirabilis  -Completed amoxil x 7 days 11/24.  -oob to void 15. Hyponatremia:  Na+ 128 on 11/27, continues to trend down.  Abx completed recently, will reorder labs for tomorrow  Spoke with pharmacy, no outstanding medications  LOS (Days) 12 A FACE TO FACE EVALUATION WAS PERFORMED  Einar Nolasco Karis Jubanil Kenner Lewan 10/08/2016 8:06 AM

## 2016-10-08 NOTE — Progress Notes (Signed)
Occupational Therapy Session Note  Patient Details  Name: Charlene Greene MRN: [id number] Date of Birth: 03/17/1922  Today's Date: 10/08/2016 OT Individual Time: [id number] and 1300-1356  OT Individual Time Calculation (min): 61 min and 56 min   Short Term Goals: Week 1:  OT Short Term Goal 1 (Week 1): Pt will engaged in 10 minutes of functional activity with less than 2 rest breaks secondary to fatigue. OT Short Term Goal 2 (Week 1): Pt will perform LB dressing with min A in order to increase I with self care. OT Short Term Goal 3 (Week 1): Pt will perform toileting with min A in order to increase I with self care. OT Short Term Goal 4 (Week 1): Pt will perform UB dressing with set up A in order to increase I with LB self care. Week 2:  OT Short Term Goal 1 (Week 2): STGs=LTGs due to ELOS    Skilled Therapeutic Interventions/Progress Updates:   Pt was sitting up in w/c at time of arrival, just finishing up breakfast. Pt was agreeable to shower. Tx focus on L UE NMR, problem solving, and safety awareness during self care completion. Pt ambulated with rollator to shower bench in simulated home setup with small threshold. Mod vcs on safe technique required. Bathing completed with overall supervision, able to use ankle over knee strategies for LE bathing and use LH sponge for back. Afterwards dressing was completed in recliner. Pt encouraged to not use armrests due to not having armrests for "dressing chair" at her ILF. With extra time, sit<stand completed with supervision, though pt was educated on possibility of replacing armless chairs in room with ones with armrests to increase ease of sit<stand. Pt able to complete UB/LB dressing with overall supervision today! Educated pt on completing LB dressing prior to UB dressing to allow UB to dry and increase ease of donning bra. Adaptive methods were attempted with min success. Pt finally able to don bra with her normal technique. At end of session pt was  repositioned in recliner for comfort and left with all needs within reach.   2nd Session 1:1 Tx (56 minutes) Pt participated in skilled OT session focusing on walker safety during IADL tasks and L UE NMR. Pt was sitting in recliner at time of arrival, requested to complete laundry. Pt ambulated with rollator and supervision down to laundry room (with pt able to recall location with min direction cues). Cues provided for safe walker positioning when loading clothes into washing machine (with L UE) and methods for utilizing energy conservation. Afterwards pt ambulated to therapy kitchen to complete simulated meal prep task. Per pt, she is responsible for one meal of the day at her ILF. She usually prepares sandwiches or soup. Pt and son Charlene Greene([name], who arrived during session) were provided education regarding safe kitchen modifications, proper positioning of rollator and energy conservation during meal prep with emphasis on opportunities for L NMR. Mod cues for new learning/short term memory of item location and carryover of education. Pt attempts to swing rollator around to prepare to sit when fatigued. Educated on turning body around locked rollator for better stability when preparing to sit. At end of session pt ambulated back to room in manner as written above. Pt left in recliner with [name] present and all needs within reach.   Therapy Documentation Precautions:  Precautions Precautions: Fall Restrictions Weight Bearing Restrictions: No General:   Vital Signs: Therapy Vitals BP: (!) 131/56 Pain: No c/o pain during sessions    ADL:  See Function Navigator for Current Functional Status.   Therapy/Group: Individual Therapy  Heyward Douthit A Jomes Giraldo 10/08/2016, 12:41 PM

## 2016-10-08 NOTE — Progress Notes (Signed)
Physical Therapy Session Note  Patient Details  Name: Charlene Greene MRN: [id number] Date of Birth: 30-Sep-1922  Today's Date: 10/08/2016 PT Individual Time: 0903-1003 AND [id number] PT Individual Time Calculation (min): 60 min AND 30 min   Short Term Goals: Week 2:  PT Short Term Goal 1 (Week 2): = LTG of Mod I for D/C on 11/30  Skilled Therapeutic Interventions/Progress Updates:  Pt received in recliner after shower; pt very fatigued.  Performed assessment of vitals and RN present to provide medications.  While RN present, discussed with pt goals of therapy over next few days prior to D/C and areas to focus on including simple meal prep for lunches, transfers out of various heights of furniture to simulate furniture in apartment and dining hall, tall car transfers, family education with daughter in law prior to D/C, balance and gait with rollator.  Pt agreeable.  Pt requesting to transport to apartment in w/c due to fatigue.  Performed multiple sit <> stands from various furniture including low recliner, low couch, wheelchair and stand pivots with rollator with supervision and extra time to bring COG over BOS.  Also performed flat bed mobility in apartment with pt practicing removing and making up covers without UE support on RW with supervision and sit <> supine and rolling with supervision.  Performed dynamic balance training in hallway with UE support on rail x 25' performing retro stepping, tandem gait and R/L lateral stepping with min A except for one episode of LLE knee buckling with pt requiring total A to prevent fall and to return to standing-pt returned to sitting and examined-no harm noted.  At end of session pt returned to room and transferred to recliner with rollator and supervision.  PM session: pt received in recliner; pt reporting significant fatigue and L knee pain; pt reports when she was in the kitchen with OT she had another episode of L knee buckling.  Pt requested to hold off on  ambulating on elevator and in cafeteria due to fatigue and pain.  Pt agreeable to participate in LLE strengthening in sitting: LAQ, theraband resisted hamstring curls, standing theraband resisted LLE terminal knee extensions, open chain hamstring curls, hip extension and hip ABD x 10 reps each with UE support on back of chair.  Pt requesting to use toilet.  Pt ambulated to/from toilet with rollator and supervision and performed toileting tasks with min A for balance.  Ambulated to bed with rollator and transitioned from sit > supine on flat bed with supervision.  Pt positioned in bed with all items within reach.  No episodes of LE buckling this pm.  Therapy Documentation Precautions:  Precautions Precautions: Fall Restrictions Weight Bearing Restrictions: No Vital Signs: Therapy Vitals BP: (!) 131/56   See Function Navigator for Current Functional Status.   Therapy/Group: Individual Therapy  Edman CircleHall, Sarp Vernier [facility] CenterFaucette 10/08/2016, 10:37 AM

## 2016-10-08 NOTE — Progress Notes (Signed)
*  PRELIMINARY RESULTS* Vascular Ultrasound Renal Artery Duplex has been completed.  Preliminary findings: No evidence of right renal artery stenosis. Right kidney is 2 cm smaller than the left. Left mid renal artery suggests 1-59% stenosis, but this appears to be at a curvature in the artery which may increase velocities.   Farrel DemarkJill Eunice, RDMS, RVT   10/08/2016, 4:21 PM

## 2016-10-09 ENCOUNTER — Inpatient Hospital Stay (HOSPITAL_COMMUNITY): Payer: Medicare Other | Admitting: Physical Therapy

## 2016-10-09 ENCOUNTER — Inpatient Hospital Stay (HOSPITAL_COMMUNITY): Payer: Medicare Other | Admitting: Occupational Therapy

## 2016-10-09 LAB — URINALYSIS, ROUTINE W REFLEX MICROSCOPIC
BILIRUBIN URINE: NEGATIVE
Glucose, UA: NEGATIVE mg/dL
Hgb urine dipstick: NEGATIVE
Ketones, ur: NEGATIVE mg/dL
NITRITE: NEGATIVE
PH: 6.5 (ref 5.0–8.0)
Protein, ur: NEGATIVE mg/dL
SPECIFIC GRAVITY, URINE: 1.008 (ref 1.005–1.030)

## 2016-10-09 LAB — BASIC METABOLIC PANEL
Anion gap: 8 (ref 5–15)
BUN: 14 mg/dL (ref 6–20)
CALCIUM: 9 mg/dL (ref 8.9–10.3)
CO2: 27 mmol/L (ref 22–32)
Chloride: 93 mmol/L — ABNORMAL LOW (ref 101–111)
Creatinine, Ser: 0.93 mg/dL (ref 0.44–1.00)
GFR calc Af Amer: 60 mL/min — ABNORMAL LOW (ref >60)
GFR, EST NON AFRICAN AMERICAN: 52 mL/min — AB (ref >60)
GLUCOSE: 79 mg/dL (ref 65–99)
POTASSIUM: 4.2 mmol/L (ref 3.5–5.1)
Sodium: 128 mmol/L — ABNORMAL LOW (ref 135–145)

## 2016-10-09 LAB — GLUCOSE, CAPILLARY
Glucose-Capillary: 72 mg/dL (ref 65–99)
Glucose-Capillary: 206 mg/dL — ABNORMAL HIGH (ref 65–99)
Glucose-Capillary: 180 mg/dL — ABNORMAL HIGH (ref 65–99)
Glucose-Capillary: 141 mg/dL — ABNORMAL HIGH (ref 65–99)

## 2016-10-09 LAB — URINE MICROSCOPIC-ADD ON: RBC / HPF: NONE SEEN RBC/hpf (ref 0–5)

## 2016-10-09 MED ORDER — FUROSEMIDE 40 MG PO TABS
40.0000 mg | ORAL_TABLET | Freq: Two times a day (BID) | ORAL | Status: DC
  Administered 2016-10-09 – 2016-10-11 (×4): 40 mg via ORAL
  Filled 2016-10-09 (×4): qty 1

## 2016-10-09 NOTE — Progress Notes (Signed)
Physical Therapy Session Note  Patient Details  Name: Charlene Greene MRN: [id number] Date of Birth: 1922-06-07  Today's Date: 10/09/2016 PT Individual Time: [id number] AND 1505-1600 PT Individual Time Calculation (min): 57 min AND 55 min   Short Term Goals: Week 2:  PT Short Term Goal 1 (Week 2): = LTG of Mod I for D/C on 11/30  Skilled Therapeutic Interventions/Progress Updates:  Pt received seated EOB with gown on requesting to don clothing and folding clean clothing to put away.  Pt reporting pain in L knee today and increased weakness in LUE; vitals assessed-all WFL.  RN notified of pain and weakness.  Pt performed upper body and lower dressing in sitting and standing EOB with UE support on RW and min A for balance and assistance donning L side of shirt and one pant leg and assistance pulling pants up over hips-discussed compensatory strategies and problem solved safe ways to perform dressing at home.  Required one sitting rest break EOB while RN provided medications-discussed medication management at home-advised pt to practice loading pill boxes with OT.  After donning clothing pt transferred stand pivot bed > w/c with rollator and min A.  Pt did not wish to ambulate due to knee pain.  Transitioned to small gym to review tall car transfer.  Pt performed stand pivot w/c <> tall simulated SUV with rollator with min A.  Required assistance to lean to reposition in car.  Returned to w/c and to room and transitioned from w/c > recliner with rollator and min A. Pt set up with all items within reach; discussed with OT performing clothing folding and medication management practice during OT session secondary to pt not wishing to bathe today.    PM session:  Pt received in recliner; pt reporting being very fatigued but willing to participate.  Pt reporting ace wrap on L knee has helped with pain; discussed ordering neoprene knee sleeve for support but concerned about pt ability to don/doff with UE  weakness.  Pt performed sit > stand from recliner and pivoted to w/c with rollator and min A.  Pt transported to elevators in w/c and then engaged in functional gait training with rollator on/off elevator and in cafeteria negotiating around tables and transferring into and out of dining room chair to simulate eating in her dining hall at ILF.  Pt required supervision and verbal cues to sequence safe sit <> stand from dining room chair.  Back on rehab pt reporting having incontinent BM: returned to room and performed transfer w/c > toilet with rollator and min A; pt able to doff clothing in standing with supervision.  Pt able to stand with supervision for hygiene and then engaged pt in changing from soiled clothing to clean underwear and gown in sitting on toilet with supervision.  Pt transferred to bed with rollator and supervision-min A and to supine with supervision.  Pt left in bed to rest with all items within reach.  RN notified of incontinence.    Therapy Documentation Precautions:  Precautions Precautions: Fall Restrictions Weight Bearing Restrictions: No Vital Signs: Therapy Vitals Temp: 98.4 F (36.9 C) Temp Source: Oral Pulse Rate: 64 BP: (!) 149/77 Patient Position (if appropriate): Sitting Oxygen Therapy SpO2: 95 % O2 Device: Not Delivered Pain: Pain Assessment Pain Assessment: 0-10 Pain Score: 5  Pain Type: Acute pain Pain Location: Knee Pain Orientation: Left Pain Descriptors / Indicators: Sore Pain Onset: Gradual Pain Intervention(s): Rest   See Function Navigator for Current Functional Status.   Therapy/Group:  Individual Therapy  Edman CircleHall, Sanaai Doane Faucette 10/09/2016, 11:10 AM

## 2016-10-09 NOTE — Progress Notes (Signed)
[facility]IC[facility] & RE[facility]ABILITATION     PROGRESS NOTE    Subjective/Complaints: Pt sitting up in bed eating breakfast.  She slept well.  She is looking forward to discharge soon.    ROS: Denies CP, SOB, N/V/D.   Objective: Vital Signs: Blood pressure (!) 153/79, pulse 66, temperature 98.3 F (36.8 C), temperature source Oral, resp. rate 18, height 5\' 2"  (1.575 m), weight 82.4 kg (181 lb 9.6 oz), SpO2 93 %. No results found.  Recent Labs  10/08/16 0622  WBC 4.1  HGB 14.8  HCT 42.7  PLT 254    Recent Labs  10/08/16 0622 10/09/16 0644  NA 128* 128*  K 4.3 4.2  CL 90* 93*  GLUCOSE 95 79  BUN 13 14  CREATININE 0.93 0.93  CALCIUM 9.0 9.0   CBG (last 3)   Recent Labs  10/08/16 1711 10/08/16 2117 10/09/16 0643  GLUCAP 67 171* 72    Wt Readings from Last 3 Encounters:  09/26/16 82.4 kg (181 lb 9.6 oz)  09/23/16 83 kg (183 lb)    Physical Exam:  Constitutional: NAD. Vital signs reviewed. Well developed.  HENT: [place]. AT Eyes: EOMI. No discharge.  Cardiovascular: RRR. No JVD.  Respiratory: CTA bilaterally. Unlabored.  GI: BS+. NT Musculoskeletal: She exhibits no tenderness.  Neurological: She is alert and oriented.  Motor: RUE: 4+/5 proximal to distal RLE: 4/5 proximal to distal LUE: Shoulder abduction 3-/5, elbow flex/ext 4/5, hand grip 4+/5  LLE: 4/5 HF, 4-4+/5 KE, 4+/5 ADF/PF Skin: Skin  intact  Psychiatric: pleasant and coopeartive   Assessment/Plan: 1. Functional deficits secondary to right basal ganglia infarct which require 3+ hours per day of interdisciplinary therapy in a comprehensive inpatient rehab setting. Physiatrist is providing close team supervision and 24 hour management of active medical problems listed below. Physiatrist and rehab team continue to assess barriers to discharge/monitor patient progress toward functional and medical goals.  Function:  Bathing Bathing position   Position: Shower  Bathing parts Body parts  bathed by patient: Right arm, Left arm, Chest, Abdomen, Front perineal area, Buttocks, Right upper leg, Left upper leg, Right lower leg, Left lower leg, Back Body parts bathed by helper: Back  Bathing assist Assist Level: Supervision or verbal cues   Set up : To obtain items  Upper Body Dressing/Undressing Upper body dressing   What is the patient wearing?: Bra, Pull over shirt/dress Bra - Perfomed by patient: Thread/unthread right bra strap, Thread/unthread left bra strap, Hook/unhook bra (pull down sports bra) Bra - Perfomed by helper: Hook/unhook bra (pull down sports bra) Pull over shirt/dress - Perfomed by patient: Thread/unthread right sleeve, Put head through opening, Pull shirt over trunk, Thread/unthread left sleeve Pull over shirt/dress - Perfomed by helper: Thread/unthread left sleeve        Upper body assist Assist Level: Supervision or verbal cues      Lower Body Dressing/Undressing Lower body dressing   What is the patient wearing?: Underwear, Pants, Shoes Underwear - Performed by patient: Thread/unthread right underwear leg, Thread/unthread left underwear leg, Pull underwear up/down Underwear - Performed by helper: Pull underwear up/down Pants- Performed by patient: Thread/unthread right pants leg, Thread/unthread left pants leg, Pull pants up/down Pants- Performed by helper: Pull pants up/down Non-skid slipper socks- Performed by patient: Don/doff right sock Non-skid slipper socks- Performed by helper: Don/doff left sock Socks - Performed by patient: Don/doff right sock, Don/doff left sock   Shoes - Performed by patient: Don/doff right shoe, Don/doff left shoe, Fasten right,  Fasten left         TED Hose - Performed by helper: Don/doff right TED hose, Don/doff left TED hose  Lower body assist Assist for lower body dressing: Supervision or verbal cues      Toileting Toileting   Toileting steps completed by patient: Performs perineal hygiene, Adjust clothing after  toileting Toileting steps completed by helper: Adjust clothing prior to toileting Toileting Assistive Devices: Grab bar or rail  Toileting assist Assist level: Touching or steadying assistance (Pt.75%)   Transfers Chair/bed transfer   Chair/bed transfer method: Ambulatory Chair/bed transfer assist level: Supervision or verbal cues Chair/bed transfer assistive device: Patent attorneyWalker     Locomotion Ambulation     Max distance: 150 ft Assist level: Supervision or verbal cues   Wheelchair   Type: Manual Max wheelchair distance: 7625ft  Assist Level: Touching or steadying assistance (Pt > 75%)  Cognition Comprehension Comprehension assist level: Follows complex conversation/direction with extra time/assistive device  Expression Expression assist level: Expresses basic needs/ideas: With extra time/assistive device  Social Interaction Social Interaction assist level: Interacts appropriately 90% of the time - Needs monitoring or encouragement for participation or interaction.  Problem Solving Problem solving assist level: Solves basic 90% of the time/requires cueing < 10% of the time  Memory Memory assist level: More than reasonable amount of time    Medical Problem List and Plan: 1.  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  DVT Prophylaxis/Anticoagulation: Subcutaneous Lovenox. Monitor platelet counts and any signs of bleeding 3. Pain Management: Lyrica 75 mg twice a day 4. Mood: BuSpar 5 mg twice a day, melatonin 3 mg daily at bedtime resumed for sleep 5. Neuropsych: This patient is capable of making decisions on her own behalf. 6. Skin/Wound Care: loca care 7. Fluids/Electrolytes/Nutrition: encouragePO.  8. Sick sinus syndrome with pacemaker. Cardiac rate control 9. Hypertension:   Imdur increased to 60mg  daily 11/17  Toprol-XL 50 mg daily- increased to 100mg  daily 11/18  Ranexa 500 mg twice a day,   Norvasc 5 mg  twice a day, increased 10 BID  Lisinopril 40 mg twice a day   Started Hydralazine 25 TID on 11/20, increased to 50 11/21, increased to 75 on 11/26  Labile, but overall Improving, will consider further increase if necessary.   Renal ultrasound ordered 11/20, results reviewed showing normal right renal artery, abnormalities in left   Will consult Nephro 10. Diabetes mellitus with peripheral neuropathy. Hemoglobin A1c 5.2.SSI. Check blood sugars before meals and at bedtime.   Patient on Amaryl 2 mg twice daily at home--increased to 1mg  BID, increased to 2mg  BID on 11/24.  Labile, but improving overall   Will consider further increase if necessary 11. Hyperlipidemia. Fenofibrate 160 mg daily,Zetia 10 mg daily. 12. Constipation. Laxative assistance 13 AKI: Resolved  Cr 0.93 on 11/28 14. UTI  -UA+, UCX 100k proteus mirabilis  -Completed amoxil x 7 days 11/24.  -oob to void 15. Hyponatremia:  Na+ 128 on 11/28, stable.  Abx completed recently  Spoke with pharmacy and reviewed meds, no outstanding medications  LOS (Days) 13 A FACE TO FACE EVALUATION WAS PERFORMED  Jermar Colter Karis Jubanil Tina Temme 10/09/2016 8:06 AM

## 2016-10-09 NOTE — Progress Notes (Signed)
Occupational Therapy Session Note  Patient Details  Name: Charlene Greene MRN: [id number] Date of Birth: February 01, 1922  Today's Date: 10/09/2016 OT Individual Time: 0950-1100 OT Individual Time Calculation (min): 70 min     Short Term Goals: Week 1:  OT Short Term Goal 1 (Week 1): Pt will engaged in 10 minutes of functional activity with less than 2 rest breaks secondary to fatigue. OT Short Term Goal 2 (Week 1): Pt will perform LB dressing with min A in order to increase I with self care. OT Short Term Goal 3 (Week 1): Pt will perform toileting with min A in order to increase I with self care. OT Short Term Goal 4 (Week 1): Pt will perform UB dressing with set up A in order to increase I with LB self care.  Skilled Therapeutic Interventions/Progress Updates:   1:1 Declined bathing and dressing today due to already getting dressed with PT. Participated in folding laundry in standing position (with bed elevated). Pt with difficulty with weight shifting towards the left when reaching for the clothes placed on chair to the left. ACE wrapped left knee for increased support and pt reported that felt better and made weight bearing more tolerable. Pt required supervision to min A for sit to stands. Pt with decr dexterity of left hand; having difficulty with manipulating fabric to fold clothing; heavy reliance on right hand.  Pt performed pill box activity to test dexterity for filling pill boxes correctly. Pt able to follow med list but dropped pills frequently from left hand. Also discussed safety in the kitchen and ability to perform laundry down the hall from her room at the ALF. Left resting in recliner.   Therapy Documentation Precautions:  Precautions Precautions: Fall Restrictions Weight Bearing Restrictions: No Pain: Pain Assessment Pain Assessment: 0-10 Pain Score: 5  Pain Type: Acute pain Pain Location: Knee Pain Orientation: Left Pain Descriptors / Indicators: Sore Pain Onset:  Gradual Pain Intervention(s): Rest  See Function Navigator for Current Functional Status.   Therapy/Group: Individual Therapy  Roney MansSmith, Virdia Ziesmer [facility] 10/09/2016, 2:51 PM

## 2016-10-09 NOTE — Consult Note (Signed)
Reason for Consult:Hypertension, poor control post CVA Referring Physician: Dr. [name] is an 82 y.o. female.  HPI: 81 yr old female with hx admit 11/12 for CVA.  Now on Rehab.  HTN for 40-50 yr, DM of over 38yr  3 sisters with HTN, and did not know if parents did.  Denies tremor , HA, SOB, CP, but has pacer.  Denies recurrent flushing , abdm pain, D.  Chronic cough. Constitutional: negative except for weak Eyes: macular degen, R worse than L Ears, nose, mouth, throat, and face: negative Respiratory: cough, no phlegn Cardiovascular: neg except Pacer Gastrointestinal: negative Genitourinary:negative Integument/breast: negative Musculoskeletal:arth of knees, hips , arms Endocrine: DM Allergic/Immunologic: negative, statins, amlod???, piroxicam, celebrex   Past Medical History:  Diagnosis Date  . Diabetes mellitus without complication ([place]   . Hypertension   . Pacemaker     Past Surgical History:  Procedure Laterality Date  . PACEMAKER PLACEMENT      History reviewed. No pertinent family history.  Social History:  reports that she has never smoked. She has never used smokeless tobacco. She reports that she does not drink alcohol. Her drug history is not on file.  Allergies:  Allergies  Allergen Reactions  . Statins Other (See Comments)    Intol to numerous statins still feels has some muscle weakness, myalgia  . Amlodipine Other (See Comments)    unknown  . Celecoxib Nausea Only and Other (See Comments)  . Codeine Other (See Comments)    unknown  . Naproxen Other (See Comments)    unknown  . Piroxicam Other (See Comments)    unknown    Medications:  I have reviewed the patient's current medications. Prior to Admission:  Prescriptions Prior to Admission  Medication Sig Dispense Refill Last Dose  . amLODipine (NORVASC) 5 MG tablet Take 5 mg by mouth 2 (two) times daily.   09/23/2016 at Unknown time  . BL MAGNESIUM PO Take 1 tablet by mouth daily.    09/23/2016 at Unknown time  . busPIRone (BUSPAR) 5 MG tablet Take 5 mg by mouth 2 (two) times daily.   09/23/2016 at Unknown time  . calcium carbonate (OSCAL) 1500 (600 Ca) MG TABS tablet Take 600 mg of elemental calcium by mouth 2 (two) times daily with a meal.   09/23/2016 at Unknown time  . cloNIDine (CATAPRES) 0.2 MG tablet Take 0.5 tablets (0.1 mg total) by mouth 2 (two) times daily. Can increase to 0.273mBID in 3-4days     . clopidogrel (PLAVIX) 75 MG tablet Take 1 tablet (75 mg total) by mouth daily.     . Marland Kitchenzetimibe (ZETIA) 10 MG tablet Take 1 tablet (10 mg total) by mouth daily.     . fenofibrate 160 MG tablet Take 160 mg by mouth daily.   09/23/2016 at 730a  . glimepiride (AMARYL) 2 MG tablet Take 2-3 mg by mouth 2 (two) times daily. 1 tab in the am and 1/2 hs     . isosorbide mononitrate (IMDUR) 60 MG 24 hr tablet Take 60 mg by mouth daily.   09/23/2016 at Unknown time  . Melatonin 3 MG TABS Take 3 mg by mouth at bedtime.   09/22/2016 at Unknown time  . metoprolol succinate (TOPROL-XL) 100 MG 24 hr tablet Take 100 mg by mouth daily.    09/22/2016 at 8pm  . Multiple Vitamin (MULTIVITAMIN WITH MINERALS) TABS tablet Take 1 tablet by mouth daily.   09/23/2016 at Unknown time  . Multiple Vitamins-Minerals (PRESERVISION  AREDS 2) CAPS Take 2 capsules by mouth daily.     . niacin 250 MG tablet Take 250 mg by mouth every morning.   09/23/2016 at Unknown time  . Omega-3 Fatty Acids (FISH OIL) 1000 MG CPDR Take 2,000 capsules by mouth 2 (two) times daily.   09/23/2016 at Unknown time  . pregabalin (LYRICA) 75 MG capsule Take 75 mg by mouth 2 (two) times daily.   09/23/2016 at Unknown time  . ranolazine (RANEXA) 500 MG 12 hr tablet Take 500 mg by mouth 2 (two) times daily.   09/23/2016 at Unknown time  . lisinopril (PRINIVIL,ZESTRIL) 40 MG tablet Take 40 mg by mouth 2 (two) times daily.   09/23/2016 at 730am    Results for orders placed or performed during the hospital encounter of 09/26/16 (from  the past 48 hour(s))  Glucose, capillary     Status: Abnormal   Collection Time: 10/07/16  9:35 PM  Result Value Ref Range   Glucose-Capillary 146 (H) 65 - 99 mg/dL  Basic metabolic panel     Status: Abnormal   Collection Time: 10/08/16  6:22 AM  Result Value Ref Range   Sodium 128 (L) 135 - 145 mmol/L   Potassium 4.3 3.5 - 5.1 mmol/L   Chloride 90 (L) 101 - 111 mmol/L   CO2 30 22 - 32 mmol/L   Glucose, Bld 95 65 - 99 mg/dL   BUN 13 6 - 20 mg/dL   Creatinine, Ser 0.93 0.44 - 1.00 mg/dL   Calcium 9.0 8.9 - 10.3 mg/dL   GFR calc non Af Amer 52 (L) >60 mL/min   GFR calc Af Amer 60 (L) >60 mL/min    Comment: (NOTE) The eGFR has been calculated using the CKD EPI equation. This calculation has not been validated in all clinical situations. eGFR's persistently <60 mL/min signify possible Chronic Kidney Disease.    Anion gap 8 5 - 15  CBC with Differential/Platelet     Status: None   Collection Time: 10/08/16  6:22 AM  Result Value Ref Range   WBC 4.1 4.0 - 10.5 K/uL   RBC 5.02 3.87 - 5.11 MIL/uL   Hemoglobin 14.8 12.0 - 15.0 g/dL   HCT 42.7 36.0 - 46.0 %   MCV 85.1 78.0 - 100.0 fL   MCH 29.5 26.0 - 34.0 pg   MCHC 34.7 30.0 - 36.0 g/dL   RDW 13.6 11.5 - 15.5 %   Platelets 254 150 - 400 K/uL   Neutrophils Relative % 58 %   Neutro Abs 2.4 1.7 - 7.7 K/uL   Lymphocytes Relative 28 %   Lymphs Abs 1.2 0.7 - 4.0 K/uL   Monocytes Relative 11 %   Monocytes Absolute 0.5 0.1 - 1.0 K/uL   Eosinophils Relative 2 %   Eosinophils Absolute 0.1 0.0 - 0.7 K/uL   Basophils Relative 1 %   Basophils Absolute 0.0 0.0 - 0.1 K/uL  Glucose, capillary     Status: None   Collection Time: 10/08/16  6:22 AM  Result Value Ref Range   Glucose-Capillary 83 65 - 99 mg/dL  Glucose, capillary     Status: Abnormal   Collection Time: 10/08/16 11:22 AM  Result Value Ref Range   Glucose-Capillary 183 (H) 65 - 99 mg/dL  Glucose, capillary     Status: None   Collection Time: 10/08/16  5:11 PM  Result Value  Ref Range   Glucose-Capillary 67 65 - 99 mg/dL  Glucose, capillary     Status:  Abnormal   Collection Time: 10/08/16  9:17 PM  Result Value Ref Range   Glucose-Capillary 171 (H) 65 - 99 mg/dL  Glucose, capillary     Status: None   Collection Time: 10/09/16  6:43 AM  Result Value Ref Range   Glucose-Capillary 72 65 - 99 mg/dL  Basic metabolic panel     Status: Abnormal   Collection Time: 10/09/16  6:44 AM  Result Value Ref Range   Sodium 128 (L) 135 - 145 mmol/L   Potassium 4.2 3.5 - 5.1 mmol/L   Chloride 93 (L) 101 - 111 mmol/L   CO2 27 22 - 32 mmol/L   Glucose, Bld 79 65 - 99 mg/dL   BUN 14 6 - 20 mg/dL   Creatinine, Ser 0.93 0.44 - 1.00 mg/dL   Calcium 9.0 8.9 - 10.3 mg/dL   GFR calc non Af Amer 52 (L) >60 mL/min   GFR calc Af Amer 60 (L) >60 mL/min    Comment: (NOTE) The eGFR has been calculated using the CKD EPI equation. This calculation has not been validated in all clinical situations. eGFR's persistently <60 mL/min signify possible Chronic Kidney Disease.    Anion gap 8 5 - 15  Glucose, capillary     Status: Abnormal   Collection Time: 10/09/16 11:33 AM  Result Value Ref Range   Glucose-Capillary 206 (H) 65 - 99 mg/dL    No results found.  ROS Blood pressure (!) 134/56, pulse (!) 59, temperature 98.6 F (37 C), temperature source Oral, resp. rate 18, height '5\' 2"'  (1.575 m), weight 82.4 kg (181 lb 9.6 oz), SpO2 97 %. Physical Exam Physical Examination: General appearance - alert, well appearing, and in no distress  Mental status - alert, oriented to person, place, and time oriented but slow Eyes - pupils equal and reactive, extraocular eye movements intact, pale fundi Mouth - mucous membranes moist, pharynx normal without lesions Neck - adenopathy noted PCL Lymphatics - no palpable lymphadenopathy, no hepatosplenomegaly, posterior cervical nodes Chest - no R, R, or W, decreased bs Heart - S1 and S2 normal, systolic murmur VV6/1 at lower left sternal  border Abdomen - bruises, obese Extremities - pedal edema 1 +, decreased DP, L fem bruit Skin - pale ,doughy, bruises  Assessment/Plan: 1 CVA per rehab 2DM controlle 3 Hypertension: most likely essential/vascular.  Needs diuretic in regimen to [place] effectiveness of other meds and may then be albe to decrease others. No secondary w/u indica  P Lasix bid, follow bps, orthostasis, Cr  Charlene Greene L 10/09/2016, 4:42 PM

## 2016-10-10 ENCOUNTER — Inpatient Hospital Stay (HOSPITAL_COMMUNITY): Payer: Medicare Other | Admitting: Occupational Therapy

## 2016-10-10 ENCOUNTER — Inpatient Hospital Stay (HOSPITAL_COMMUNITY): Payer: Medicare Other | Admitting: Physical Therapy

## 2016-10-10 LAB — GLUCOSE, CAPILLARY
GLUCOSE-CAPILLARY: 156 mg/dL — AB (ref 65–99)
Glucose-Capillary: 72 mg/dL (ref 65–99)
Glucose-Capillary: 170 mg/dL — ABNORMAL HIGH (ref 65–99)
Glucose-Capillary: 136 mg/dL — ABNORMAL HIGH (ref 65–99)

## 2016-10-10 NOTE — Progress Notes (Signed)
Social Work Patient ID: Charlene Greene, female   DOB: 04-10-1922, 82 y.o.   MRN: [id number]  Anselm Pancoast, LCSW Social Worker Signed   Patient Care Conference Date of Service: 10/10/2016  3:14 PM      Hide copied text Hover for attribution information Inpatient RehabilitationTeam Conference and Plan of Care Update Date: 10/10/2016   Time: 2:30 pm      Patient Name: Charlene Greene      Medical Record Number: [id number]  Date of Birth: July 25, 1922 Sex: Female         Room/Bed: 4M02C/4M02C-01 Payor Info: Payor: MEDICARE / Plan: MEDICARE PART A AND B / Product Type: *No Product type* /     Admitting Diagnosis: CVA  Admit Date/Time:  09/26/2016  6:14 PM Admission Comments: No comment available    Primary Diagnosis:  Basal ganglia infarction (HCC) Principal Problem: Basal ganglia infarction [facility])       Patient Active Problem List    Diagnosis Date Noted  . Labile blood glucose    . Hypotension due to drugs    . Hyponatremia    . Renovascular hypertension    . Hypertensive crisis    . Type 2 diabetes mellitus with diabetic autonomic neuropathy, without long-term current use of insulin (HCC)    . Stage 3 chronic kidney disease    . Acute lower UTI    . Acute renal insufficiency 09/27/2016  . Urinary urgency 09/27/2016  . Basal ganglia infarction (HCC) 09/26/2016  . Neuropathic pain    . Benign essential HTN    . Slow transit constipation    . Elevated creatine kinase    . Accelerated hypertension 09/24/2016  . Diabetes mellitus (HCC) 09/24/2016  . Hyperlipidemia 09/24/2016  . Sick sinus syndrome (HCC) 09/24/2016  . CVA (cerebral vascular accident) (HCC) 09/23/2016  . Stroke (cerebrum) (HCC) 09/23/2016      Expected Discharge Date: Expected Discharge Date: 10/11/16   Team Members Present: Physician leading conference: Dr. Faith Rogue Social Worker Present: Amada Jupiter, LCSW Nurse Present: Chana Bode, RN PT Present: Edman Circle, PT OT Present: Callie Fielding, OT SLP  Present: Feliberto Gottron, SLP PPS Coordinator present : Tora Duck, RN, CRRN       Current Status/Progress Goal Weekly Team Focus  Medical   Left hemiparesis secondary to right basal ganglia infarct secondary to small vessel disease on 11/12  Mobility, strength, DM, HTN, hyponatremia  See above   Bowel/Bladder   continent of b&b, LBM 10/09/16  remain continent of b&b  continue to monitor q shift   Swallow/Nutrition/ Hydration             ADL's   supervision  Supervision-Mod I   safety with mobility, standing balance and endurance, left UE NMR   Mobility   ranges from min A to supervision  Supervision-mod I  L sided strengthening and NMR, functional transfers, dynamic balance, gait   Communication             Safety/Cognition/ Behavioral Observations           Pain   no c/o of pain  pain <2  continue to monitor q shift and prn   Skin   No skin breakdown, bruising of abdomen due to lovenox injections  no skin breakdown while in rehab  monitor q shift     Rehab Goals Patient on target to meet rehab goals: Yes *See Care Plan and progress notes for long and short-term goals.   Barriers to Discharge: Uncontrolled HTN,  DM, hyponatremia, safety, mobility, strength   Possible Resolutions to Barriers:  Optimize BP, DM meds, folow labs, therapies   Discharge Planning/Teaching Needs:  Pt to return to her independent living apt at [facility].      Team Discussion:  Reaching mod independent goals and ready for d/c tomorrow.  Plan to make Mod ind in the room this afternoon.  Revisions to Treatment Plan:  None    Continued Need for Acute Rehabilitation Level of Care: The patient requires daily medical management by a physician with specialized training in physical medicine and rehabilitation for the following conditions: Daily direction of a multidisciplinary physical rehabilitation program to ensure safe treatment while eliciting the highest outcome that is of practical value to the  patient.: Yes Daily medical management of patient stability for increased activity during participation in an intensive rehabilitation regime.: Yes Daily analysis of laboratory values and/or radiology reports with any subsequent need for medication adjustment of medical intervention for : Neurological problems;Diabetes problems;Blood pressure problems;Other   [name] 10/10/2016, 3:14 PM      Anselm PancoastLucy R Brier Firebaugh, LCSW Social Worker Signed   Patient Care Conference Date of Service: 10/03/2016  1:31 PM      Hide copied text Hover for attribution information Inpatient RehabilitationTeam Conference and Plan of Care Update Date: 10/03/2016   Time: 11:30 am      Patient Name: Charlene Greene      Medical Record Number: [id number]  Date of Birth: 01-Jun-1922 Sex: Female         Room/Bed: 4W05C/4W05C-01 Payor Info: Payor: MEDICARE / Plan: MEDICARE PART A AND B / Product Type: *No Product type* /     Admitting Diagnosis: CVA  Admit Date/Time:  09/26/2016  6:14 PM Admission Comments: No comment available    Primary Diagnosis:  Basal ganglia infarction (HCC) Principal Problem: Basal ganglia infarction [facility](HCC)       Patient Active Problem List    Diagnosis Date Noted  . Hyponatremia    . Renovascular hypertension    . Hypertensive crisis    . Type 2 diabetes mellitus with diabetic autonomic neuropathy, without long-term current use of insulin (HCC)    . Stage 3 chronic kidney disease    . Acute lower UTI    . Acute renal insufficiency 09/27/2016  . Urinary urgency 09/27/2016  . Basal ganglia infarction (HCC) 09/26/2016  . Neuropathic pain    . Benign essential HTN    . Slow transit constipation    . Elevated creatine kinase    . Accelerated hypertension 09/24/2016  . Diabetes mellitus (HCC) 09/24/2016  . Hyperlipidemia 09/24/2016  . Sick sinus syndrome (HCC) 09/24/2016  . CVA (cerebral vascular accident) (HCC) 09/23/2016  . Stroke (cerebrum) (HCC) 09/23/2016      Expected Discharge  Date: Expected Discharge Date: 10/11/16   Team Members Present: Physician leading conference: Dr. Maryla MorrowAnkit Patel Social Worker Present: Amada JupiterLucy Willowdean Luhmann, LCSW Nurse Present: Chana Bodeeborah Sharp, RN PT Present: Karolee StampsAlison Gray, PT OT Present: Callie FieldingKatie Pittman, OT SLP Present: Feliberto Gottronourtney Payne, SLP PPS Coordinator present : Tora DuckMarie Noel, RN, CRRN       Current Status/Progress Goal Weekly Team Focus  Medical   Left hemiparesis secondary to right basal ganglia infarct secondary to small vessel disease on 11/12  Improve mobility, safety, DM, HTN, electrolytes  See above   Bowel/Bladder   continent of bowel and bladder LBM 09/30/16  remain continent of bowel and bladder  continue to monitor q shift   Swallow/Nutrition/ Hydration   Regular textures  with thin liquids, Intermittent supervision  Mod I with least restrictive diet  Increase use of swallowing compensatory strategies   ADL's   Min A overall   Supervision-Mod I   Activity tolerance, L UE NMR, standing endurance/balance, cognition, safety awareness during functional transfers and self care completion   Mobility   Min A   Supervision-mod I  balance, gait with rollator, activity tolerance, postural control   Communication             Safety/Cognition/ Behavioral Observations no unsafe behaviors  min assist  continue to monitor    Pain   no complaints of pain  pain less than or equal to 2  continue to monitor q shift   Skin   no skin breakdown  no skin breakdown this admission  continue to monitor q shift     Rehab Goals Patient on target to meet rehab goals: Yes *See Care Plan and progress notes for long and short-term goals.   Barriers to Discharge: Uncontrolled HTN, DM, hyponatremia, UTI, safety, mobility   Possible Resolutions to Barriers:  Monitor BP, DM, folow labs, abx for UTI, therapies   Discharge Planning/Teaching Needs:  Pt to return to her independent living apt at [facility].  NA - mod ind goals.   Team Discussion:  Medically more  stable.  BP better controled.  Doing will with PT/OT and using rollator.  Currently supervision to min assist overall with mod ind goals.  ST saw pt for swallowing but issues appear to be baseline.    Revisions to Treatment Plan:  None    Continued Need for Acute Rehabilitation Level of Care: The patient requires daily medical management by a physician with specialized training in physical medicine and rehabilitation for the following conditions: Daily direction of a multidisciplinary physical rehabilitation program to ensure safe treatment while eliciting the highest outcome that is of practical value to the patient.: Yes Daily medical management of patient stability for increased activity during participation in an intensive rehabilitation regime.: Yes Daily analysis of laboratory values and/or radiology reports with any subsequent need for medication adjustment of medical intervention for : Neurological problems;Diabetes problems;Blood pressure problems   Valeree Leidy 10/03/2016, 1:31 PM

## 2016-10-10 NOTE — Progress Notes (Signed)
Occupational Therapy Discharge Summary  Patient Details  Name: Charlene Greene MRN: [id number] Date of Birth: 09-Apr-1922  Today's Date: 10/10/2016 OT Individual Time: 0700-0800 and 1400-1431 OT Individual Time Calculation (min): 60 min and 31 min     Patient has met 9 of 9 long term goals due to improved activity tolerance, improved balance and ability to compensate for deficits.  Patient to discharge at overall Modified Independent level.    Reasons goals not met: all goals met  Recommendation:  Patient will benefit from ongoing skilled OT services in home health setting to continue to advance functional skills in the area of BADL and iADL.  Equipment: No equipment provided  Reasons for discharge: treatment goals met  Patient/family agrees with progress made and goals achieved: Yes   OT Intervention:  Session 1: Upon entering the room, pt supine in bed with no c/o pain this session. Pt agreeable to OT intervention. She declined shower but requested to wash at sink this session. Pt engaged in sit <>stand from chair at sink with mod I and increased time to complete task. Pt donned clothing items while seated on EOB with mod I. Pt needing frequent rest breaks secondary to fatigue. Pt transferred from bed > recliner chair with use of rollator and mod I. Call bell and all needed items within reach upon exiting the room.   Session 2: Upon entering the room, pt supine in bed but easily awakes for session. Pt ambulated with rollator to ADL apartment for kitchen mobility task. Pt taking seated rest break secondary to fatigue once arriving in kitchen. Pt ambulated with rollator to obtain soup from cabinet, open with B hands, and utilize rollator to transport and cook in microwave with mod I and increased time. Pt taking rest break once task completed before ambulating back to room. Pt seated in recliner chair at end of session with call bell and all needed items within reach upon exiting the room.    OT Discharge Precautions/Restrictions Precautions Precautions: Fall Restrictions Weight Bearing Restrictions: No Pain Pain Assessment Pain Assessment: No/denies pain Sensation Sensation Light Touch: Impaired Detail Light Touch Impaired Details: Impaired RUE;Impaired LUE;Impaired RLE;Impaired LLE Stereognosis: Not tested Hot/Cold: Not tested Proprioception: Appears Intact Additional Comments: premorbid numbness in bilat toes and fingers due to neuropathy Coordination Gross Motor Movements are Fluid and Coordinated: No Fine Motor Movements are Fluid and Coordinated: No Motor  Motor Motor: Hemiplegia;Abnormal postural alignment and control Mobility  Bed Mobility Bed Mobility: Supine to Sit;Sit to Supine Supine to Sit: 6: Modified independent (Device/Increase time);HOB flat Sit to Supine: 6: Modified independent (Device/Increase time);HOB flat  Trunk/Postural Assessment  Cervical Assessment Cervical Assessment: Exceptions to [facility] (forward head) Thoracic Assessment Thoracic Assessment: Exceptions to [facility] (flexed) Lumbar Assessment Lumbar Assessment: Exceptions to [facility] (lordosis) Postural Control Postural Control: Deficits on evaluation  Balance Static Sitting Balance Static Sitting - Level of Assistance: 6: Modified independent (Device/Increase time) Dynamic Sitting Balance Dynamic Sitting - Level of Assistance: 6: Modified independent (Device/Increase time) Static Standing Balance Static Standing - Level of Assistance: 6: Modified independent (Device/Increase time) Dynamic Standing Balance Dynamic Standing - Level of Assistance: 6: Modified independent (Device/Increase time) Extremity/Trunk Assessment RUE Assessment RUE Assessment: Within Functional Limits LUE Assessment LUE Assessment: Exceptions to [facility]   See Function Navigator for Current Functional Status.  Gypsy Decant 10/10/2016, 4:37 PM

## 2016-10-10 NOTE — Progress Notes (Signed)
Subjective: Interval History: has complaints sleepy.  Objective: Vital signs in last 24 hours: Temp:  [98.1 F (36.7 C)-98.6 F (37 C)] 98.1 F (36.7 C) (11/29 0556) Pulse Rate:  [59-66] 65 (11/29 0556) Resp:  [16-18] 16 (11/29 0556) BP: (134-160)/(56-88) 147/60 (11/29 0556) SpO2:  [91 %-97 %] 91 % (11/29 0556) Weight change:   Intake/Output from previous day: 11/28 0701 - 11/29 0700 In: 780 [P.O.:780] Out: -  Intake/Output this shift: Total I/O In: 240 [P.O.:240] Out: -   General appearance: alert, cooperative, no distress and moderately obese Resp: clear to auscultation bilaterally Cardio: S1, S2 normal and systolic murmur: systolic ejection 2/6, decrescendo at 2nd left intercostal space GI: soft, non-tender; bowel sounds normal; no masses,  no organomegaly Extremities: edema 1+  Lab Results:  Recent Labs  10/08/16 0622  WBC 4.1  HGB 14.8  HCT 42.7  PLT 254   BMET:  Recent Labs  10/08/16 0622 10/09/16 0644  NA 128* 128*  K 4.3 4.2  CL 90* 93*  CO2 30 27  GLUCOSE 95 79  BUN 13 14  CREATININE 0.93 0.93  CALCIUM 9.0 9.0   No results for input(s): PTH in the last 72 hours. Iron Studies: No results for input(s): IRON, TIBC, TRANSFERRIN, FERRITIN in the last 72 hours.  Studies/Results: No results found.  I have reviewed the patient's current medications.  Assessment/Plan: 1 HTn trending down . willnot see full effect of diuretic for about a week. Hopefully can get off Clonidine as contrib to sleepiness.   2 Pyruria culture 3 CVA 4 DM controlled P meds, diuretics     LOS: 14 days   [place] Ndiaye L 10/10/2016,9:28 AM

## 2016-10-10 NOTE — Patient Care Conference (Signed)
Inpatient RehabilitationTeam Conference and Plan of Care Update Date: 10/10/2016   Time: 2:30 pm    Patient Name: Charlene Greene      Medical Record Number: [id number]  Date of Birth: Mar 05, 1922 Sex: Female         Room/Bed: 4M02C/4M02C-01 Payor Info: Payor: MEDICARE / Plan: MEDICARE PART A AND B / Product Type: *No Product type* /    Admitting Diagnosis: CVA  Admit Date/Time:  09/26/2016  6:14 PM Admission Comments: No comment available   Primary Diagnosis:  Basal ganglia infarction (HCC) Principal Problem: Basal ganglia infarction [facility](HCC)  Patient Active Problem List   Diagnosis Date Noted  . Labile blood glucose   . Hypotension due to drugs   . Hyponatremia   . Renovascular hypertension   . Hypertensive crisis   . Type 2 diabetes mellitus with diabetic autonomic neuropathy, without long-term current use of insulin (HCC)   . Stage 3 chronic kidney disease   . Acute lower UTI   . Acute renal insufficiency 09/27/2016  . Urinary urgency 09/27/2016  . Basal ganglia infarction (HCC) 09/26/2016  . Neuropathic pain   . Benign essential HTN   . Slow transit constipation   . Elevated creatine kinase   . Accelerated hypertension 09/24/2016  . Diabetes mellitus (HCC) 09/24/2016  . Hyperlipidemia 09/24/2016  . Sick sinus syndrome (HCC) 09/24/2016  . CVA (cerebral vascular accident) (HCC) 09/23/2016  . Stroke (cerebrum) (HCC) 09/23/2016    Expected Discharge Date: Expected Discharge Date: 10/11/16  Team Members Present: Physician leading conference: Dr. Faith RogueZachary Swartz Social Worker Present: Amada JupiterLucy Donnis Phaneuf, LCSW Nurse Present: Chana Bodeeborah Sharp, RN PT Present: Edman CircleAudra Hall, PT OT Present: Callie FieldingKatie Pittman, OT SLP Present: Feliberto Gottronourtney Payne, SLP PPS Coordinator present : Tora DuckMarie Noel, RN, CRRN     Current Status/Progress Goal Weekly Team Focus  Medical   Left hemiparesis secondary to right basal ganglia infarct secondary to small vessel disease on 11/12  Mobility, strength, DM, HTN, hyponatremia   See above   Bowel/Bladder   continent of b&b, LBM 10/09/16  remain continent of b&b  continue to monitor q shift   Swallow/Nutrition/ Hydration             ADL's   supervision  Supervision-Mod I   safety with mobility, standing balance and endurance, left UE NMR   Mobility   ranges from min A to supervision  Supervision-mod I  L sided strengthening and NMR, functional transfers, dynamic balance, gait   Communication             Safety/Cognition/ Behavioral Observations            Pain   no c/o of pain  pain <2  continue to monitor q shift and prn   Skin   No skin breakdown, bruising of abdomen due to lovenox injections  no skin breakdown while in rehab  monitor q shift    Rehab Goals Patient on target to meet rehab goals: Yes *See Care Plan and progress notes for long and short-term goals.  Barriers to Discharge: Uncontrolled HTN, DM, hyponatremia, safety, mobility, strength    Possible Resolutions to Barriers:  Optimize BP, DM meds, folow labs, therapies    Discharge Planning/Teaching Needs:  Pt to return to her independent living apt at [facility].      Team Discussion:  Reaching mod independent goals and ready for d/c tomorrow.  Plan to make Mod ind in the room this afternoon.  Revisions to Treatment Plan:  None   Continued Need  for Acute Rehabilitation Level of Care: The patient requires daily medical management by a physician with specialized training in physical medicine and rehabilitation for the following conditions: Daily direction of a multidisciplinary physical rehabilitation program to ensure safe treatment while eliciting the highest outcome that is of practical value to the patient.: Yes Daily medical management of patient stability for increased activity during participation in an intensive rehabilitation regime.: Yes Daily analysis of laboratory values and/or radiology reports with any subsequent need for medication adjustment of medical  intervention for : Neurological problems;Diabetes problems;Blood pressure problems;Other  Runell Kovich 10/10/2016, 3:14 PM

## 2016-10-10 NOTE — Progress Notes (Signed)
[place] PHYSICAL MEDICINE & REHABILITATION     PROGRESS NOTE    Subjective/Complaints: Pt sitting up in her chair working with OT this AM.  She slept well overnight.  She has questions about her urine results.   ROS: Denies CP, SOB, N/V/D.   Objective: Vital Signs: Blood pressure (!) 147/60, pulse 65, temperature 98.1 F (36.7 C), temperature source Oral, resp. rate 16, height 5\' 2"  (1.575 m), weight 82.4 kg (181 lb 9.6 oz), SpO2 91 %. No results found.  Recent Labs  10/08/16 0622  WBC 4.1  HGB 14.8  HCT 42.7  PLT 254    Recent Labs  10/08/16 0622 10/09/16 0644  NA 128* 128*  K 4.3 4.2  CL 90* 93*  GLUCOSE 95 79  BUN 13 14  CREATININE 0.93 0.93  CALCIUM 9.0 9.0   CBG (last 3)   Recent Labs  10/09/16 1732 10/09/16 2044 10/10/16 0635  GLUCAP 180* 141* 72    Wt Readings from Last 3 Encounters:  09/26/16 82.4 kg (181 lb 9.6 oz)  09/23/16 83 kg (183 lb)    Physical Exam:  Constitutional: NAD. Vital signs reviewed. Well developed.  HENT: [place]. AT Eyes: EOMI. No discharge.  Cardiovascular: RRR. No JVD.  Respiratory: CTA bilaterally. Unlabored.  GI: BS+. NT Musculoskeletal: She exhibits no tenderness.  Neurological: She is alert and oriented.  Motor: RUE: 4+/5 proximal to distal RLE: 4+/5 proximal to distal LUE: Shoulder abduction 3-/5, elbow flex/ext 4/5, hand grip 4+/5 (stable) LLE: 4/5 HF, 4-4+/5 KE, 4+/5 ADF/PF Skin: Skin  intact  Psychiatric: pleasant and coopeartive   Assessment/Plan: 1. Functional deficits secondary to right basal ganglia infarct which require 3+ hours per day of interdisciplinary therapy in a comprehensive inpatient rehab setting. Physiatrist is providing close team supervision and 24 hour management of active medical problems listed below. Physiatrist and rehab team continue to assess barriers to discharge/monitor patient progress toward functional and medical goals.  Function:  Bathing Bathing position   Position: Shower   Bathing parts Body parts bathed by patient: Right arm, Left arm, Chest, Abdomen, Front perineal area, Buttocks, Right upper leg, Left upper leg, Right lower leg, Left lower leg, Back Body parts bathed by helper: Back  Bathing assist Assist Level: Supervision or verbal cues   Set up : To obtain items  Upper Body Dressing/Undressing Upper body dressing   What is the patient wearing?: Bra, Pull over shirt/dress Bra - Perfomed by patient: Thread/unthread right bra strap, Thread/unthread left bra strap, Hook/unhook bra (pull down sports bra) Bra - Perfomed by helper: Hook/unhook bra (pull down sports bra) Pull over shirt/dress - Perfomed by patient: Thread/unthread right sleeve, Put head through opening, Pull shirt over trunk, Thread/unthread left sleeve Pull over shirt/dress - Perfomed by helper: Thread/unthread left sleeve        Upper body assist Assist Level: Supervision or verbal cues      Lower Body Dressing/Undressing Lower body dressing   What is the patient wearing?: Underwear, Pants, Shoes Underwear - Performed by patient: Thread/unthread right underwear leg, Thread/unthread left underwear leg, Pull underwear up/down Underwear - Performed by helper: Pull underwear up/down Pants- Performed by patient: Thread/unthread right pants leg, Thread/unthread left pants leg, Pull pants up/down Pants- Performed by helper: Pull pants up/down Non-skid slipper socks- Performed by patient: Don/doff right sock Non-skid slipper socks- Performed by helper: Don/doff left sock Socks - Performed by patient: Don/doff right sock, Don/doff left sock   Shoes - Performed by patient: Don/doff right shoe, Don/doff  left shoe, Fasten right, Fasten left         TED Hose - Performed by helper: Don/doff right TED hose, Don/doff left TED hose  Lower body assist Assist for lower body dressing: Supervision or verbal cues      Toileting Toileting   Toileting steps completed by patient: Adjust clothing prior  to toileting, Performs perineal hygiene, Adjust clothing after toileting Toileting steps completed by helper: Adjust clothing prior to toileting Toileting Assistive Devices: Grab bar or rail  Toileting assist Assist level: Touching or steadying assistance (Pt.75%)   Transfers Chair/bed transfer   Chair/bed transfer method: Ambulatory, Stand pivot Chair/bed transfer assist level: Touching or steadying assistance (Pt > 75%) Chair/bed transfer assistive device: Walker, Designer, fashion/clothingArmrests     Locomotion Ambulation     Max distance: 150 ft Assist level: Supervision or verbal cues   Wheelchair   Type: Manual Max wheelchair distance: 4225ft  Assist Level: Touching or steadying assistance (Pt > 75%)  Cognition Comprehension Comprehension assist level: Follows complex conversation/direction with extra time/assistive device  Expression Expression assist level: Expresses basic needs/ideas: With no assist  Social Interaction Social Interaction assist level: Interacts appropriately 90% of the time - Needs monitoring or encouragement for participation or interaction.  Problem Solving Problem solving assist level: Solves basic 90% of the time/requires cueing < 10% of the time  Memory Memory assist level: More than reasonable amount of time    Medical Problem List and Plan: 1.  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  DVT Prophylaxis/Anticoagulation: Subcutaneous Lovenox. Monitor platelet counts and any signs of bleeding 3. Pain Management: Lyrica 75 mg twice a day 4. Mood: BuSpar 5 mg twice a day, melatonin 3 mg daily at bedtime resumed for sleep 5. Neuropsych: This patient is capable of making decisions on her own behalf. 6. Skin/Wound Care: loca care 7. Fluids/Electrolytes/Nutrition: encouragePO.  8. Sick sinus syndrome with pacemaker. Cardiac rate control 9. Hypertension:   Imdur increased to 60mg  daily 11/17  Toprol-XL 50 mg  daily- increased to 100mg  daily 11/18  Ranexa 500 mg twice a day,   Norvasc 5 mg twice a day, increased 10 BID  Lisinopril 40 mg twice a day   Started Hydralazine 25 TID on 11/20, increased to 50 11/21, increased to 75 on 11/26  Renal ultrasound ordered 11/20, results reviewed showing normal right renal artery, abnormalities in left   Appreciate Nephro recs, Lasix 40 BID started 11/29  Remains elevated  10. Diabetes mellitus with peripheral neuropathy. Hemoglobin A1c 5.2.SSI. Check blood sugars before meals and at bedtime.   Patient on Amaryl 2 mg twice daily at home--increased to 1mg  BID, increased to 2mg  BID on 11/24.  Remains labile  Will consider further increase if necessary 11. Hyperlipidemia. Fenofibrate 160 mg daily,Zetia 10 mg daily. 12. Constipation. Laxative assistance 13 AKI: Resolved  Cr 0.93 on 11/28  Labs ordered for tomorrow 14. UTI  -UA+, UCX 100k proteus mirabilis  -Completed amoxil x 7 days 11/24.  -oob to void 15. Hyponatremia:  Na+ 128 on 11/28, stable.  Abx completed recently  Spoke with pharmacy and reviewed meds, no outstanding medications  Labs ordered for tomorrow  LOS (Days) 14 A FACE TO FACE EVALUATION WAS PERFORMED  Kemonie Cutillo Karis Jubanil Konstantin Lehnen 10/10/2016 8:11 AM

## 2016-10-10 NOTE — Discharge Summary (Signed)
[facility]:  Charlene Greene, Charlene Greene                ACCOUNT NO.:  [id number]  MEDICAL RECORD NO.:  [id number]  LOCATION:  [id number]                        FACILITY:  [facility]  PHYSICIAN:  Maryla MorrowAnkit Patel, MD        DATE OF BIRTH:  07-29-1922  DATE OF ADMISSION:  09/26/2016 DATE OF DISCHARGE:  10/11/2016                              DISCHARGE SUMMARY   DISCHARGE DIAGNOSES: 1. Right basal ganglia infarct secondary to small vessel disease. 2. Subcutaneous Lovenox for deep venous thrombosis prophylaxis. 3. Pain management. 4. Depression. 5. Sick sinus syndrome with pacemaker. 6. Hypertension. 7. Diabetes mellitus with peripheral neuropathy. 8. Hyperlipidemia. 9. Constipation. 10.Acute kidney injury - resolved. 11.Proteus urinary tract infection, completed antibiotic. 12.Hyponatremia.  HISTORY OF PRESENT ILLNESS:  This is a 82 year old right-handed female, history of diabetes mellitus, hypertension, sick sinus syndrome with pacemaker, maintained on aspirin therapy.  [facility] is [facility] for the past 2 years.  Presented on September 23, 2016, with left-sided weakness, difficulty naming objects.  At baseline, independent with assistive device.  CT of the head showed abnormal appearance of the right basal ganglia region that could represent a combination of old acute infarct.  The patient did not receive tPA.  CT angiogram of the head and neck with no emergent large vessel occlusion.  MRI not completed secondary to pacemaker. Echocardiogram with ejection fraction of 70% grade 1 diastolic dysfunction.  No regional wall abnormalities.  Maintained on Plavix for CVA prophylaxis.  Subcutaneous Lovenox for DVT prophylaxis.  Tolerating a regular diet.  Physical and Occupational Therapy ongoing.  The patient was admitted for a comprehensive rehab program.  PAST MEDICAL HISTORY:  See discharge diagnoses.  SOCIAL HISTORY:  Resident at [facility]  using assistive device prior to admission.  FUNCTIONAL STATUS:  Upon admission to Rehab Services was minimal assist, 75 feet rolling walker; minimal assist stand pivot transfers; mod-to-max assist, activities of daily living.  PHYSICAL EXAMINATION:  VITAL SIGNS:  Blood pressure 101/60, pulse 92, temperature 97, respirations 20. GENERAL:  This was an alert female, oriented to person, place and time. Followed commands.  Fair awareness of deficits. LUNGS:  Clear to auscultation without wheeze. CARDIAC:  Regular rate and rhythm without murmur. ABDOMEN:  Soft, nontender.  Good bowel sounds. EXTREMITIES:  Left upper extremity apraxia, left lower extremity knee extension 3/5, ankle dorsi plantar flexion 2/5.  REHABILITATION HOSPITAL COURSE:  The patient was admitted to Inpatient Rehab Services with therapies initiated on a 3-hour daily basis, consisting of physical therapy, occupational therapy and rehabilitation nursing.  The following issues were addressed during the patient's rehabilitation stay.  Pertaining to Charlene Greene's right basal ganglia infarct remained stable, maintained on Plavix therapy.  She would follow up Neurology Services.  Subcutaneous Lovenox for DVT prophylaxis, no bleeding episodes.  She was using Lyrica for pain management.  She continued on BuSpar as well as melatonin for history of depression and insomnia with good results.  Ongoing bouts of hypertension, multiple antihypertensive medications.  Renal ultrasound was ordered to rule out any renal artery stenosis, reviewed by Renal Services that had been consulted for ongoing management of blood pressure medications with Lasix added  to her regimen at 40 mg b.i.d. and titrated accordingly. Latest follow-up chemistries 10/11/2016 with potassium 3.6. Home health nurse to check chemistries in one week to monitor renal function and potassium levels. She would follow up with her primary MD.  Blood sugars overall controlled.   Hemoglobin A1c of 5.2.  She continued on Amaryl 2 mg twice daily, which was increased on October 05, 2016, and full diabetic teaching.  Bouts of constipation resolved with laxative assistance.  She was treated for a Proteus urinary tract infection with amoxicillin x7 days.  Bouts of hyponatremia 128, remained stable.  No outstanding medications felt to be inducing her hyponatremia.  She could follow up chemistries.  The patient received weekly collaborative interdisciplinary team conferences to discuss estimated length of stay, family teaching, any barriers to her discharge.  The patient utilizing a rolling walker for ambulation, minimal assist for balance, performed stand pivot wheelchair, simulated SUV with rolling walker and minimal assistance.  Working with energy conservation techniques.  Performed sit to stand from recliner and pivoted to wheelchair with minimal assistance.  The patient able to stand with supervision for hygiene and engaged in changing her clothing.  She participated in folding laundry in standing position.  Full family teaching was completed and plan discharge to home.  DISCHARGE MEDICATIONS: 1. Norvasc 10 mg p.o. b.i.d. 2. BuSpar 5 mg p.o. b.i.d. 3. Plavix 75 mg p.o. daily. 4. Zetia 10 mg p.o. daily. 5. Fenofibrate 160 mg p.o. daily. 6. Lasix 40 mg p.o. b.i.d. 7. Amaryl 2 mg p.o. b.i.d. 8. Hydralazine 75 mg p.o. every 8 hours. 9. Imdur 60 mg p.o. daily. 10.Lisinopril 40 mg p.o. b.i.d. 11.Melatonin 3 mg p.o. at bedtime. 12.Toprol 100 mg p.o. daily. 13.Niacin 250 mg p.o. at bedtime. 14.Lyrica 75 mg p.o. b.i.d. 15.PreserVision eye drops 1 drop p.o. b.i.d. 16.Ranexa 500 mg p.o. b.i.d. 17.Senokot tablets 2 p.o. b.i.d., hold for loose stool. 18. Potassium chloride 20 mEq twice a day   DIET:  Diabetic diet.  Special instructions. Follow up chemistries one week to monitor renal function as well as check potassium levels while on recent addition of  Lasix.  FOLLOWUP:  The patient would follow up with Dr. Maryla MorrowAnkit Patel at the Outpatient Rehab Service Office as advised; Dr. Delia HeadyPramod Sethi, call for appointment, 2 months; Dr. Vernona RiegerKatherine Clark, followup medical management; Dr. Fayrene FearingJames Deterding for any ongoing management of hypertension as needed.  Home health therapies had been arranged.     Mariam Dollaraniel Angiulli, P.A.   ______________________________ Maryla MorrowAnkit Patel, MD    DA/MEDQ  D:  10/10/2016  T:  10/10/2016  Job:  [id number]  cc:   Vernona RiegerKatherine Clark, MD Pramod P. Pearlean BrownieSethi, MD Llana AlimentJames L. Deterding, M.D.

## 2016-10-10 NOTE — Progress Notes (Signed)
Physical Therapy Discharge Summary  Patient Details  Name: Charlene Greene MRN: [id number] Date of Birth: November 05, 1922  Today's Date: 10/10/2016 PT Individual Time: 8413-2440 and 1507-1600  PT Individual Time Calculation (min): 53 min and 53 min  Patient has met 8 of 8 long term goals due to improved activity tolerance, improved balance, improved postural control, increased strength, decreased pain and functional use of  left upper extremity and left lower extremity.  Patient to discharge at an ambulatory level Modified Independent.   Patient's care partner is independent to provide the necessary supervision assistance at discharge.  Reasons goals not met: All goals met  Recommendation:  Patient will benefit from ongoing skilled PT services in home health setting to continue to advance safe functional mobility, address ongoing impairments in impaired L sided strength, motor control, activity tolerance/endurance, postural control, balance, gait, and minimize fall risk.  Equipment: No equipment provided; pt has necessary AD  Reasons for discharge: discharge from hospital  Patient/family agrees with progress made and goals achieved: Yes  PT Discharge AM session: Pt received in recliner, pt still reporting UE pain and weakness today but willing to participate.  Pt participated in continued transfer training including sit <> stand from recliner, elevated car, low couch, bed and bed mobility on flat bed with no rail all Mod I except supervision for car transfer.  Pt performed gait in controlled and home environment mod I with rollator and across compliant surface with supervision.  Performed reassessment of strength and sensation and discussed safety in apartment.  At end of session pt returned to recliner to rest with all items within reach.  PM session: pt received asleep in recliner.  No c/o pain.  Pt performed sit > stand and ambulated to toilet with rollator Mod I and performed all toileting  tasks Mod I with extra time.  Performed gait to gym with rollator and Mod I.  Pt performed stair negotiation training for LE strengthening up/down 12 stairs with 2 rails and min A with verbal cues for safe step to sequencing.  Returned to room and reviewed sequence for performing supine <> sit on hospital bed and transfers to/from toilet with rollator Mod I.  Pt reports at home she has slide on bedroom shoes that she does not have here; pt's regular shoes put at foot of bed next to rollator.  Pt left in bed to rest with all items within reach.  Sensation Sensation Light Touch: Impaired Detail Light Touch Impaired Details: Impaired RUE;Impaired LUE;Impaired RLE;Impaired LLE Stereognosis: Not tested Hot/Cold: Not tested Proprioception: Appears Intact Additional Comments: premorbid numbness in bilat toes and fingers due to neuropathy Coordination Gross Motor Movements are Fluid and Coordinated: No Motor  Motor Motor: Hemiplegia;Abnormal postural alignment and control  Mobility Bed Mobility Bed Mobility: Supine to Sit;Sit to Supine Supine to Sit: 6: Modified independent (Device/Increase time);HOB flat Sit to Supine: 6: Modified independent (Device/Increase time);HOB flat Transfers Transfers: Yes Stand Pivot Transfers: 6: Modified independent (Device/Increase time);With armrests Locomotion  Ambulation Ambulation/Gait Assistance: 6: Modified independent (Device/Increase time) Assistive device: 4-wheeled walker Gait Gait Pattern: Impaired Gait Pattern: Step-through pattern;Decreased stance time - left;Decreased step length - left;Decreased stride length;Decreased weight shift to right;Lateral trunk lean to left;Decreased trunk rotation;Narrow base of support;Poor foot clearance - left Stairs / Additional Locomotion Stairs: Yes Stairs Assistance: 4: Min assist Stair Management Technique: Two rails;Step to pattern;Forwards Number of Stairs: 12 Height of Stairs: 6 Wheelchair  Mobility Wheelchair Mobility: No  Trunk/Postural Assessment  Cervical Assessment Cervical Assessment: Exceptions  to [facility] (forward head) Thoracic Assessment Thoracic Assessment: Exceptions to [facility] (flexed posture) Lumbar Assessment Lumbar Assessment: Exceptions to [facility] (decreased lordosis) Postural Control Postural Control: Deficits on evaluation (impaired forward weight shift)  Balance Static Sitting Balance Static Sitting - Level of Assistance: 6: Modified independent (Device/Increase time) Dynamic Sitting Balance Dynamic Sitting - Level of Assistance: 6: Modified independent (Device/Increase time) Static Standing Balance Static Standing - Level of Assistance: 6: Modified independent (Device/Increase time) Dynamic Standing Balance Dynamic Standing - Level of Assistance: 6: Modified independent (Device/Increase time) Extremity Assessment  RLE Assessment RLE Assessment: Exceptions to [facility] (3+/5 hip flexion, 4+/5 knee and ankle) LLE Assessment LLE Assessment: Exceptions to [facility] (4-/5 except hip flexion and knee flexion 3+/5)   See Function Navigator for Current Functional Status.  Charlene Greene 10/10/2016, 4:10 PM

## 2016-10-11 LAB — BASIC METABOLIC PANEL
Anion gap: 10 (ref 5–15)
BUN: 14 mg/dL (ref 6–20)
CHLORIDE: 90 mmol/L — AB (ref 101–111)
CO2: 30 mmol/L (ref 22–32)
CREATININE: 1.06 mg/dL — AB (ref 0.44–1.00)
Calcium: 9.2 mg/dL (ref 8.9–10.3)
GFR calc Af Amer: 51 mL/min — ABNORMAL LOW (ref >60)
GFR calc non Af Amer: 44 mL/min — ABNORMAL LOW (ref >60)
Glucose, Bld: 64 mg/dL — ABNORMAL LOW (ref 65–99)
POTASSIUM: 3.6 mmol/L (ref 3.5–5.1)
Sodium: 130 mmol/L — ABNORMAL LOW (ref 135–145)

## 2016-10-11 LAB — URINE CULTURE: Culture: <10000 — AB

## 2016-10-11 LAB — GLUCOSE, CAPILLARY: GLUCOSE-CAPILLARY: 70 mg/dL (ref 65–99)

## 2016-10-11 LAB — CBC WITH DIFFERENTIAL/PLATELET
Basophils Absolute: 0 10*3/uL (ref 0.0–0.1)
Basophils Relative: 1 %
EOS ABS: 0.1 10*3/uL (ref 0.0–0.7)
EOS PCT: 1 %
HCT: 42 % (ref 36.0–46.0)
HEMOGLOBIN: 14.3 g/dL (ref 12.0–15.0)
LYMPHS ABS: 1.2 10*3/uL (ref 0.7–4.0)
LYMPHS PCT: 31 %
MCH: 29.1 pg (ref 26.0–34.0)
MCHC: 34 g/dL (ref 30.0–36.0)
MCV: 85.4 fL (ref 78.0–100.0)
MONOS PCT: 10 %
Monocytes Absolute: 0.4 10*3/uL (ref 0.1–1.0)
Neutro Abs: 2.3 10*3/uL (ref 1.7–7.7)
Neutrophils Relative %: 57 %
Platelets: 279 10*3/uL (ref 150–400)
RBC: 4.92 MIL/uL (ref 3.87–5.11)
RDW: 13.9 % (ref 11.5–15.5)
WBC: 4 10*3/uL (ref 4.0–10.5)

## 2016-10-11 MED ORDER — LISINOPRIL 40 MG PO TABS: 40.0000 mg | ORAL_TABLET | Freq: Two times a day (BID) | ORAL | 1 refills | Status: DC

## 2016-10-11 MED ORDER — FUROSEMIDE 40 MG PO TABS: 40.0000 mg | ORAL_TABLET | Freq: Two times a day (BID) | ORAL | 1 refills | Status: DC

## 2016-10-11 MED ORDER — ISOSORBIDE MONONITRATE ER 60 MG PO TB24: 60.0000 mg | ORAL_TABLET | Freq: Every day | ORAL | 1 refills | Status: AC

## 2016-10-11 MED ORDER — FENOFIBRATE 160 MG PO TABS: 160.0000 mg | ORAL_TABLET | Freq: Every day | ORAL | 0 refills | Status: DC

## 2016-10-11 MED ORDER — CLOPIDOGREL BISULFATE 75 MG PO TABS: 75.0000 mg | ORAL_TABLET | Freq: Every day | ORAL | 1 refills | Status: DC

## 2016-10-11 MED ORDER — POTASSIUM CHLORIDE CRYS ER 20 MEQ PO TBCR
20.0000 meq | EXTENDED_RELEASE_TABLET | Freq: Two times a day (BID) | ORAL | Status: DC
  Administered 2016-10-11: 20 meq via ORAL
  Filled 2016-10-11: qty 1

## 2016-10-11 MED ORDER — AMLODIPINE BESYLATE 10 MG PO TABS: 10.0000 mg | ORAL_TABLET | Freq: Two times a day (BID) | ORAL | 1 refills | Status: DC

## 2016-10-11 MED ORDER — PREGABALIN 75 MG PO CAPS
75.0000 mg | ORAL_CAPSULE | Freq: Two times a day (BID) | ORAL | 1 refills | Status: DC
Start: 2016-10-11 — End: 2018-02-26

## 2016-10-11 MED ORDER — SENNOSIDES-DOCUSATE SODIUM 8.6-50 MG PO TABS: 2.0000 | ORAL_TABLET | Freq: Two times a day (BID) | ORAL | Status: DC

## 2016-10-11 MED ORDER — HYDRALAZINE HCL 25 MG PO TABS: 75.0000 mg | ORAL_TABLET | Freq: Three times a day (TID) | ORAL | 0 refills | Status: DC

## 2016-10-11 MED ORDER — BUSPIRONE HCL 5 MG PO TABS: 5.0000 mg | ORAL_TABLET | Freq: Two times a day (BID) | ORAL | 0 refills | Status: DC

## 2016-10-11 MED ORDER — NIACIN 250 MG PO TABS: 250.0000 mg | ORAL_TABLET | ORAL | 1 refills | Status: DC

## 2016-10-11 MED ORDER — PRESERVISION AREDS 2 PO CAPS: 1.0000 | ORAL_CAPSULE | Freq: Two times a day (BID) | ORAL | 0 refills | Status: DC

## 2016-10-11 MED ORDER — RANOLAZINE ER 500 MG PO TB12: 500.0000 mg | ORAL_TABLET | Freq: Two times a day (BID) | ORAL | 1 refills | Status: AC

## 2016-10-11 MED ORDER — METOPROLOL SUCCINATE ER 100 MG PO TB24: 100.0000 mg | ORAL_TABLET | Freq: Every day | ORAL | 1 refills | Status: DC

## 2016-10-11 MED ORDER — EZETIMIBE 10 MG PO TABS: 10.0000 mg | ORAL_TABLET | Freq: Every day | ORAL | 1 refills | Status: DC

## 2016-10-11 MED ORDER — GLIMEPIRIDE 2 MG PO TABS: 2.0000 mg | ORAL_TABLET | Freq: Two times a day (BID) | ORAL | 1 refills | Status: DC

## 2016-10-11 MED ORDER — POTASSIUM CHLORIDE CRYS ER 20 MEQ PO TBCR: 20.0000 meq | EXTENDED_RELEASE_TABLET | Freq: Two times a day (BID) | ORAL | 1 refills | Status: DC

## 2016-10-11 NOTE — Progress Notes (Signed)
Patient and family discussed all the discharged instructions with Dan,A,PA before they left the hospital.

## 2016-10-11 NOTE — Discharge Instructions (Signed)
Managing Your Hypertension Hypertension is commonly called high blood pressure. Blood pressure is a measurement of how strongly your blood is pressing against the walls of your arteries. Arteries are blood vessels that carry blood from your heart throughout your body. Blood pressure does not stay the same. It rises when you are active, excited, or nervous. It lowers when you are sleeping or relaxed. If the numbers that measure your blood pressure stay above normal most of the time, you are at risk for health problems. Hypertension is a long-term (chronic) condition in which blood pressure is elevated. This condition often has no signs or symptoms. The cause of the condition is usually not known. What are blood pressure readings? A blood pressure reading is recorded as two numbers, such as "120 over 80" (or 120/80). The first ("top") number is called the systolic pressure. It is a measure of the pressure in your arteries as the heart beats. The second ("bottom") number is called the diastolic pressure. It is a measure of the pressure in your arteries as the heart relaxes between beats. What does my blood pressure reading mean? Blood pressure is classified into four stages. Based on your blood pressure reading, your health care provider may use the following stages to determine what type of treatment, if any, is needed. Systolic pressure and diastolic pressure are measured in a unit called mm Hg. Normal  Systolic pressure: below 120.  Diastolic pressure: below 80. Prehypertension  Systolic pressure: 120-139.  Diastolic pressure: 80-89. Hypertension stage 1  Systolic pressure: 140-159.  Diastolic pressure: 90-99. Hypertension stage 2  Systolic pressure: 160 or above.  Diastolic pressure: 100 or above. What health risks are associated with hypertension? Managing your hypertension is an important responsibility. Uncontrolled hypertension can lead to:  A heart attack.  A stroke.  A  weakened blood vessel (aneurysm).  Heart failure.  Kidney damage.  Eye damage.  Metabolic syndrome.  Memory and concentration problems. What changes can I make to manage my hypertension? Hypertension can be managed effectively by making lifestyle changes and possibly by taking medicines. Your health care provider will help you come up with a plan to bring your blood pressure within a normal range. Your plan should include the following: Monitoring  Monitor your blood pressure at home as told by your health care provider. Your personal target blood pressure may vary depending on your medical conditions, your age, and other factors.  Have your blood pressure rechecked as told by your health care provider. Lifestyle  Lose weight if necessary.  Get at least 30-45 minutes of aerobic exercise at least 4 times a week.  Do not use any products that contain nicotine or tobacco, such as cigarettes and e-cigarettes. If you need help quitting, ask your health care provider.  Learn ways to reduce stress.  Control any chronic conditions, such as high cholesterol or diabetes. Eating and drinking  Follow the DASH diet. This diet is high in fruits, vegetables, and whole grains. It is low in salt, red meat, and added sugars.  Keep your sodium intake below 2,300 mg per day.  Limit alcoholic beverages. Communication  Review all the medicines you take with your health care provider because there may be side effects or interactions.  Talk with your health care provider about your diet, exercise habits, and other lifestyle factors that may be contributing to hypertension.  See your health care provider regularly. Your health care provider can help you create and adjust your plan for managing hypertension.  Will I need medicine to control my blood pressure? Your health care provider may prescribe medicine if lifestyle changes are not enough to get your blood pressure under control, and if one of  the following is true:  You are 8418-80 years of age, and your systolic blood pressure is 140 or higher.  You are 80 years of age or older, and your systolic blood pressure is 150 or higher.  Your diastolic blood pressure is 90 or higher.  You have diabetes, and your systolic blood pressure is over 140 or your diastolic blood pressure is over 90.  You have kidney disease, and your blood pressure is above 140/90.  You have heart disease or a history of stroke, and your blood pressure is 140/90 or higher. Take medicines only as told by your health care provider. Follow the directions carefully. Blood pressure medicines must be taken as prescribed. The medicine does not work as well when you skip doses. Skipping doses also puts you at risk for problems. Contact a health care provider if:  You think you are having a reaction to medicines you have taken.  You have repeated (recurrent) headaches.  You feel dizzy.  You have swelling in your ankles.  You have trouble with your vision. Get help right away if:  You develop a severe headache or confusion.  You have unusual weakness or numbness, or you feel faint.  You have severe pain in your chest or abdomen.  You vomit repeatedly.  You have trouble breathing. This information is not intended to replace advice given to you by your health care provider. Make sure you discuss any questions you have with your health care provider. Document Released: 07/23/2012 Document Revised: 07/03/2016 Document Reviewed: 01/27/2016 Elsevier Interactive Patient Education  2017 [employer]   Hypertension Hypertension is another name for high blood pressure. High blood pressure forces your heart to work harder to pump blood. A blood pressure reading has two numbers, which includes a higher number over a lower number (example: 110/72). Follow these instructions at home:  Have your blood pressure rechecked by your doctor.  Only take medicine as told  by your doctor. Follow the directions carefully. The medicine does not work as well if you skip doses. Skipping doses also puts you at risk for problems.  Do not smoke.  Monitor your blood pressure at home as told by your doctor. Contact a doctor if:  You think you are having a reaction to the medicine you are taking.  You have repeat headaches or feel dizzy.  You have puffiness (swelling) in your ankles.  You have trouble with your vision. Get help right away if:  You get a very bad headache and are confused.  You feel weak, numb, or faint.  You get chest or belly (abdominal) pain.  You throw up (vomit).  You cannot breathe very well. This information is not intended to replace advice given to you by your health care provider. Make sure you discuss any questions you have with your health care provider. Document Released: 04/16/2008 Document Revised: 04/05/2016 Document Reviewed: 08/21/2013 Elsevier Interactive Patient Education  2017 [employer] Inpatient Rehab Discharge Instructions  Charlene Greene Discharge date and time: No discharge date for patient encounter.   Activities/Precautions/ Functional Status: Activity: activity as tolerated Diet: diabetic diet Wound Care: none needed Functional status:  ___ No restrictions     ___ Walk up steps independently ___ 24/7 supervision/assistance   ___ Walk up steps with assistance ___ Intermittent supervision/assistance  ___ Bathe/dress independently ___ Walk with walker     _x STROKE/TIA DISCHARGE INSTRUCTIONS SMOKING Cigarette smoking nearly doubles your risk of having a stroke & is the single most alterable risk factor  If you smoke or have smoked in the last 12 months, you are advised to quit smoking for your health.  Most of the excess cardiovascular risk related to smoking disappears within a year of stopping.  Ask you doctor about anti-smoking medications  [place] Quit Line: 1-800-QUIT NOW  Free Smoking Cessation  Classes [phone]  CHOLESTEROL Know your levels; limit fat & cholesterol in your diet  Lipid Panel     Component Value Date/Time   CHOL 274 (H) 09/24/2016 0528   TRIG 530 (H) 09/24/2016 0528   HDL 32 (L) 09/24/2016 0528   CHOLHDL 8.6 09/24/2016 0528   VLDL UNABLE TO CALCULATE IF TRIGLYCERIDE OVER 400 mg/dL 42/59/563811/13/2017 [id number]   LDLCALC UNABLE TO CALCULATE IF TRIGLYCERIDE OVER 400 mg/dL 33/29/518811/13/2017 [id number]      Many patients benefit from treatment even if their cholesterol is at goal.  Goal: Total Cholesterol (CHOL) less than 160  Goal:  Triglycerides (TRIG) less than 150  Goal:  HDL greater than 40  Goal:  LDL (LDLCALC) less than 100   BLOOD PRESSURE American Stroke Association blood pressure target is less that 120/80 mm/Hg  Your discharge blood pressure is:  BP: (!) 164/80  Monitor your blood pressure  Limit your salt and alcohol intake  Many individuals will require more than one medication for high blood pressure  DIABETES (A1c is a blood sugar average for last 3 months) Goal HGBA1c is under 7% (HBGA1c is blood sugar average for last 3 months)  Diabetes:   Lab Results  Component Value Date   HGBA1C 5.2 09/25/2016     Your HGBA1c can be lowered with medications, healthy diet, and exercise.  Check your blood sugar as directed by your physician  Call your physician if you experience unexplained or low blood sugars.  PHYSICAL ACTIVITY/REHABILITATION Goal is 30 minutes at least 4 days per week  Activity: Increase activity slowly, Therapies: Physical Therapy: Home Health Return to work:   Activity decreases your risk of heart attack and stroke and makes your heart stronger.  It helps control your weight and blood pressure; helps you relax and can improve your mood.  Participate in a regular exercise program.  Talk with your doctor about the best form of exercise for you (dancing, walking, swimming, cycling).  DIET/WEIGHT Goal is to maintain a healthy weight  Your  discharge diet is: Diet Heart Room service appropriate? Yes; Fluid consistency: Thin  liquids Your height is:  Height: 5\' 2"  (157.5 cm) Your current weight is: Weight: 82.4 kg (181 lb 9.6 oz) Your Body Mass Index (BMI) is:  BMI (Calculated): 33.3  Following the type of diet specifically designed for you will help prevent another stroke.  Your goal weight range is:    Your goal Body Mass Index (BMI) is 19-24.  Healthy food habits can help reduce 3 risk factors for stroke:  High cholesterol, hypertension, and excess weight.  RESOURCES Stroke/Support Group:  Call [phone]   STROKE EDUCATION PROVIDED/REVIEWED AND GIVEN TO PATIENT Stroke warning signs and symptoms How to activate emergency medical system (call 911). Medications prescribed at discharge. Need for follow-up after discharge. Personal risk factors for stroke. Pneumonia vaccine given:  Flu vaccine given:  My questions have been answered, the writing is legible, and I understand these instructions.  I  will adhere to these goals & educational materials that have been provided to me after my discharge from the hospital.   __ Bathe/dress with assistance ___ Walk Independently    ___ Shower independently ___ Walk with assistance    ___ Shower with assistance ___ No alcohol     ___ Return to work/school ________    COMMUNITY REFERRALS UPON DISCHARGE:    Home Health:   PT    OT                      Agency: to be provided by [employer] Instructions:  Follow-up BMET one week to monitor chemistries and renal function will maintain on Lasix and check potassium levels  My questions have been answered and I understand these instructions. I will adhere to these goals and the provided educational materials after my discharge from the hospital.  Patient/Caregiver Signature _______________________________ Date __________  Clinician Signature _______________________________________ Date  __________  Please bring this form and your medication list with you to all your follow-up doctor's appointments.

## 2016-10-11 NOTE — Progress Notes (Signed)
Social Work  Discharge Note  The overall goal for the admission was met for:   Discharge location: Yes - returning to her independent living apt at [facility] of Stay: Yes - 15 days  Discharge activity level: Yes - modified independent  Home/community participation: Yes  Services provided included: MD, RD, PT, OT, SLP, RN, TR, Pharmacy and SW  Financial Services: Medicare and Private Insurance: UHC/GE[employer]  Follow-up services arranged: Home Health: PT, OT to be provided via [employer] and Patient/Family has no preference for HH/DME agencies  Comments (or additional information):  Patient/Family verbalized understanding of follow-up arrangements: Yes  Individual responsible for coordination of the follow-up plan: pt  Confirmed correct DME delivered: NA - had all needs    Amirr Achord

## 2016-10-12 ENCOUNTER — Telehealth: Payer: Self-pay

## 2016-10-12 NOTE — Telephone Encounter (Signed)
Transitional Care call- Azha Yerian     1. Are you/is patient experiencing any problems since coming home? No Are there any questions regarding any aspect of care? 2. Are there any questions regarding medications administration/dosing? No Are meds being taken as prescribed? Yes  3. Have there been any falls? No 4. Has Home Health been to the house and/or have they contacted you? No  5. Are bowels and bladder emptying properly? Yes 6. Any fevers, problems with breathing, unexpected pain? No 7. Are there any skin problems or new areas of breakdown? No 8. Has the patient/family member arranged specialty MD follow up (ie cardiology/neurology/renal/surgical/etc)? Yes 9. Does the patient need any other services or support that we can help arrange? No possibly transportation to future appointments patient will keep us updated  10. Are caregivers following through as expected in assisting the patient? Yes 11. Has the patient quit smoking, drinking alcohol, or using drugs as recommended? Patient doesn't use tobacco products or alcohol   Appointment time Confirmed

## 2016-10-19 ENCOUNTER — Encounter: Payer: Medicare Other | Admitting: Physical Medicine & Rehabilitation

## 2016-11-30 ENCOUNTER — Other Ambulatory Visit: Payer: Self-pay

## 2018-02-22 ENCOUNTER — Encounter (HOSPITAL_COMMUNITY): Payer: Self-pay

## 2018-02-22 ENCOUNTER — Emergency Department (HOSPITAL_COMMUNITY): Payer: Medicare Other

## 2018-02-22 ENCOUNTER — Inpatient Hospital Stay (HOSPITAL_COMMUNITY)
Admission: EM | Admit: 2018-02-22 | Discharge: 2018-02-26 | DRG: 065 | Disposition: A | Discharge status: Rehabilitation Facility | Payer: Medicare Other | Attending: Internal Medicine | Admitting: Internal Medicine

## 2018-02-22 ENCOUNTER — Other Ambulatory Visit: Payer: Self-pay

## 2018-02-22 DIAGNOSIS — L821 Other seborrheic keratosis: Secondary | ICD-10-CM | POA: Diagnosis present

## 2018-02-22 DIAGNOSIS — R402252 Coma scale, best verbal response, oriented, at arrival to emergency department: Secondary | ICD-10-CM | POA: Diagnosis present

## 2018-02-22 DIAGNOSIS — E785 Hyperlipidemia, unspecified: Secondary | ICD-10-CM | POA: Diagnosis present

## 2018-02-22 DIAGNOSIS — E1140 Type 2 diabetes mellitus with diabetic neuropathy, unspecified: Secondary | ICD-10-CM | POA: Diagnosis present

## 2018-02-22 DIAGNOSIS — N183 Chronic kidney disease, stage 3 unspecified: Secondary | ICD-10-CM | POA: Diagnosis present

## 2018-02-22 DIAGNOSIS — I495 Sick sinus syndrome: Secondary | ICD-10-CM | POA: Diagnosis present

## 2018-02-22 DIAGNOSIS — Z7982 Long term (current) use of aspirin: Secondary | ICD-10-CM

## 2018-02-22 DIAGNOSIS — G459 Transient cerebral ischemic attack, unspecified: Secondary | ICD-10-CM | POA: Diagnosis present

## 2018-02-22 DIAGNOSIS — E782 Mixed hyperlipidemia: Secondary | ICD-10-CM | POA: Diagnosis not present

## 2018-02-22 DIAGNOSIS — I130 Hypertensive heart and chronic kidney disease with heart failure and stage 1 through stage 4 chronic kidney disease, or unspecified chronic kidney disease: Secondary | ICD-10-CM | POA: Diagnosis present

## 2018-02-22 DIAGNOSIS — R2990 Unspecified symptoms and signs involving the nervous system: Secondary | ICD-10-CM | POA: Diagnosis not present

## 2018-02-22 DIAGNOSIS — M879 Osteonecrosis, unspecified: Secondary | ICD-10-CM | POA: Diagnosis present

## 2018-02-22 DIAGNOSIS — Z853 Personal history of malignant neoplasm of breast: Secondary | ICD-10-CM

## 2018-02-22 DIAGNOSIS — Z8673 Personal history of transient ischemic attack (TIA), and cerebral infarction without residual deficits: Secondary | ICD-10-CM

## 2018-02-22 DIAGNOSIS — I69391 Dysphagia following cerebral infarction: Secondary | ICD-10-CM

## 2018-02-22 DIAGNOSIS — I2510 Atherosclerotic heart disease of native coronary artery without angina pectoris: Secondary | ICD-10-CM | POA: Diagnosis present

## 2018-02-22 DIAGNOSIS — I5033 Acute on chronic diastolic (congestive) heart failure: Secondary | ICD-10-CM

## 2018-02-22 DIAGNOSIS — G8194 Hemiplegia, unspecified affecting left nondominant side: Secondary | ICD-10-CM | POA: Diagnosis present

## 2018-02-22 DIAGNOSIS — Z7901 Long term (current) use of anticoagulants: Secondary | ICD-10-CM

## 2018-02-22 DIAGNOSIS — E119 Type 2 diabetes mellitus without complications: Secondary | ICD-10-CM

## 2018-02-22 DIAGNOSIS — I10 Essential (primary) hypertension: Secondary | ICD-10-CM | POA: Diagnosis not present

## 2018-02-22 DIAGNOSIS — S72009A Fracture of unspecified part of neck of unspecified femur, initial encounter for closed fracture: Secondary | ICD-10-CM

## 2018-02-22 DIAGNOSIS — E1142 Type 2 diabetes mellitus with diabetic polyneuropathy: Secondary | ICD-10-CM

## 2018-02-22 DIAGNOSIS — Z66 Do not resuscitate: Secondary | ICD-10-CM | POA: Diagnosis present

## 2018-02-22 DIAGNOSIS — M25551 Pain in right hip: Secondary | ICD-10-CM | POA: Diagnosis present

## 2018-02-22 DIAGNOSIS — R402362 Coma scale, best motor response, obeys commands, at arrival to emergency department: Secondary | ICD-10-CM | POA: Diagnosis present

## 2018-02-22 DIAGNOSIS — I63532 Cerebral infarction due to unspecified occlusion or stenosis of left posterior cerebral artery: Principal | ICD-10-CM | POA: Diagnosis present

## 2018-02-22 DIAGNOSIS — E1149 Type 2 diabetes mellitus with other diabetic neurological complication: Secondary | ICD-10-CM | POA: Diagnosis not present

## 2018-02-22 DIAGNOSIS — Z951 Presence of aortocoronary bypass graft: Secondary | ICD-10-CM

## 2018-02-22 DIAGNOSIS — Z888 Allergy status to other drugs, medicaments and biological substances status: Secondary | ICD-10-CM

## 2018-02-22 DIAGNOSIS — M21372 Foot drop, left foot: Secondary | ICD-10-CM | POA: Diagnosis present

## 2018-02-22 DIAGNOSIS — I5032 Chronic diastolic (congestive) heart failure: Secondary | ICD-10-CM | POA: Diagnosis present

## 2018-02-22 DIAGNOSIS — R29703 NIHSS score 3: Secondary | ICD-10-CM | POA: Diagnosis present

## 2018-02-22 DIAGNOSIS — E1121 Type 2 diabetes mellitus with diabetic nephropathy: Secondary | ICD-10-CM | POA: Diagnosis present

## 2018-02-22 DIAGNOSIS — Z950 Presence of cardiac pacemaker: Secondary | ICD-10-CM

## 2018-02-22 DIAGNOSIS — Z8249 Family history of ischemic heart disease and other diseases of the circulatory system: Secondary | ICD-10-CM

## 2018-02-22 DIAGNOSIS — Z794 Long term (current) use of insulin: Secondary | ICD-10-CM

## 2018-02-22 DIAGNOSIS — E1143 Type 2 diabetes mellitus with diabetic autonomic (poly)neuropathy: Secondary | ICD-10-CM | POA: Diagnosis not present

## 2018-02-22 DIAGNOSIS — Z833 Family history of diabetes mellitus: Secondary | ICD-10-CM

## 2018-02-22 DIAGNOSIS — M25552 Pain in left hip: Secondary | ICD-10-CM | POA: Diagnosis present

## 2018-02-22 DIAGNOSIS — J189 Pneumonia, unspecified organism: Secondary | ICD-10-CM

## 2018-02-22 DIAGNOSIS — R402142 Coma scale, eyes open, spontaneous, at arrival to emergency department: Secondary | ICD-10-CM | POA: Diagnosis present

## 2018-02-22 DIAGNOSIS — Z79899 Other long term (current) drug therapy: Secondary | ICD-10-CM

## 2018-02-22 DIAGNOSIS — I252 Old myocardial infarction: Secondary | ICD-10-CM

## 2018-02-22 HISTORY — DX: Sick sinus syndrome: I49.5

## 2018-02-22 HISTORY — DX: Cerebral infarction, unspecified: I63.9

## 2018-02-22 HISTORY — DX: Hyperlipidemia, unspecified: E78.5

## 2018-02-22 HISTORY — DX: Atherosclerosis of renal artery: I70.1

## 2018-02-22 HISTORY — DX: Atherosclerotic heart disease of native coronary artery without angina pectoris: I25.10

## 2018-02-22 LAB — URINALYSIS, ROUTINE W REFLEX MICROSCOPIC
BILIRUBIN URINE: NEGATIVE
GLUCOSE, UA: 50 mg/dL — AB
HGB URINE DIPSTICK: NEGATIVE
KETONES UR: NEGATIVE mg/dL
NITRITE: NEGATIVE
PROTEIN: NEGATIVE mg/dL
Specific Gravity, Urine: 1.013 (ref 1.005–1.030)
pH: 6 (ref 5.0–8.0)

## 2018-02-22 LAB — I-STAT TROPONIN, ED: Troponin i, poc: 0 ng/mL (ref 0.00–0.08)

## 2018-02-22 LAB — CBC
HCT: 40.6 % (ref 36.0–46.0)
Hemoglobin: 13.5 g/dL (ref 12.0–15.0)
MCH: 29.4 pg (ref 26.0–34.0)
MCHC: 33.3 g/dL (ref 30.0–36.0)
MCV: 88.5 fL (ref 78.0–100.0)
PLATELETS: 272 10*3/uL (ref 150–400)
RBC: 4.59 MIL/uL (ref 3.87–5.11)
RDW: 14 % (ref 11.5–15.5)
WBC: 8 10*3/uL (ref 4.0–10.5)

## 2018-02-22 LAB — I-STAT CHEM 8, ED
BUN: 27 mg/dL — AB (ref 6–20)
CALCIUM ION: 1.08 mmol/L — AB (ref 1.15–1.40)
CHLORIDE: 100 mmol/L — AB (ref 101–111)
Creatinine, Ser: 1.2 mg/dL — ABNORMAL HIGH (ref 0.44–1.00)
Glucose, Bld: 173 mg/dL — ABNORMAL HIGH (ref 65–99)
HCT: 40 % (ref 36.0–46.0)
Hemoglobin: 13.6 g/dL (ref 12.0–15.0)
Potassium: 4.3 mmol/L (ref 3.5–5.1)
SODIUM: 138 mmol/L (ref 135–145)
TCO2: 30 mmol/L (ref 22–32)

## 2018-02-22 LAB — COMPREHENSIVE METABOLIC PANEL
ALBUMIN: 3.8 g/dL (ref 3.5–5.0)
ALK PHOS: 50 U/L (ref 38–126)
ALT: 19 U/L (ref 14–54)
AST: 23 U/L (ref 15–41)
Anion gap: 10 (ref 5–15)
BUN: 24 mg/dL — ABNORMAL HIGH (ref 6–20)
CALCIUM: 9.1 mg/dL (ref 8.9–10.3)
CO2: 27 mmol/L (ref 22–32)
CREATININE: 1.11 mg/dL — AB (ref 0.44–1.00)
Chloride: 99 mmol/L — ABNORMAL LOW (ref 101–111)
GFR calc Af Amer: 48 mL/min — ABNORMAL LOW (ref >60)
GFR calc non Af Amer: 41 mL/min — ABNORMAL LOW (ref >60)
GLUCOSE: 176 mg/dL — AB (ref 65–99)
Potassium: 4.2 mmol/L (ref 3.5–5.1)
Sodium: 136 mmol/L (ref 135–145)
Total Bilirubin: 0.7 mg/dL (ref 0.3–1.2)
Total Protein: 6.1 g/dL — ABNORMAL LOW (ref 6.5–8.1)

## 2018-02-22 LAB — DIFFERENTIAL
Basophils Absolute: 0 10*3/uL (ref 0.0–0.1)
Basophils Relative: 0 %
Eosinophils Absolute: 0.1 10*3/uL (ref 0.0–0.7)
Eosinophils Relative: 1 %
LYMPHS PCT: 24 %
Lymphs Abs: 1.9 10*3/uL (ref 0.7–4.0)
Monocytes Absolute: 0.5 10*3/uL (ref 0.1–1.0)
Monocytes Relative: 6 %
NEUTROS ABS: 5.5 10*3/uL (ref 1.7–7.7)
NEUTROS PCT: 69 %

## 2018-02-22 LAB — APTT: aPTT: 27 seconds (ref 24–36)

## 2018-02-22 LAB — GLUCOSE, CAPILLARY: GLUCOSE-CAPILLARY: 139 mg/dL — AB (ref 65–99)

## 2018-02-22 LAB — PROTIME-INR
INR: 1
PROTHROMBIN TIME: 13.1 s (ref 11.4–15.2)

## 2018-02-22 LAB — BRAIN NATRIURETIC PEPTIDE: B Natriuretic Peptide: 92.2 pg/mL (ref 0.0–100.0)

## 2018-02-22 LAB — TROPONIN I: Troponin I: <0.03 ng/mL (ref <0.03)

## 2018-02-22 MED ORDER — SENNOSIDES-DOCUSATE SODIUM 8.6-50 MG PO TABS: 1.0000 | ORAL_TABLET | Freq: Every evening | ORAL | Status: DC | PRN

## 2018-02-22 MED ORDER — SODIUM CHLORIDE 0.9 % IV SOLN
INTRAVENOUS | Status: DC
  Administered 2018-02-22: via INTRAVENOUS

## 2018-02-22 MED ORDER — METOPROLOL SUCCINATE ER 100 MG PO TB24
100.0000 mg | ORAL_TABLET | Freq: Every day | ORAL | Status: DC
  Administered 2018-02-23 – 2018-02-26 (×4): 100 mg via ORAL
  Filled 2018-02-22 (×4): qty 1

## 2018-02-22 MED ORDER — ALBUTEROL SULFATE (2.5 MG/3ML) 0.083% IN NEBU: 2.5000 mg | INHALATION_SOLUTION | RESPIRATORY_TRACT | Status: DC | PRN

## 2018-02-22 MED ORDER — ACETAMINOPHEN 325 MG PO TABS
650.0000 mg | ORAL_TABLET | ORAL | Status: DC | PRN
  Administered 2018-02-25 (×2): 650 mg via ORAL
  Filled 2018-02-22 (×2): qty 2

## 2018-02-22 MED ORDER — INSULIN ASPART 100 UNIT/ML ~~LOC~~ SOLN
0.0000 [IU] | Freq: Three times a day (TID) | SUBCUTANEOUS | Status: DC
  Administered 2018-02-23: 9 [IU] via SUBCUTANEOUS
  Administered 2018-02-23: 2 [IU] via SUBCUTANEOUS
  Administered 2018-02-23: 1 [IU] via SUBCUTANEOUS
  Administered 2018-02-24: 3 [IU] via SUBCUTANEOUS
  Administered 2018-02-24: 2 [IU] via SUBCUTANEOUS
  Administered 2018-02-24 – 2018-02-25 (×2): 1 [IU] via SUBCUTANEOUS
  Administered 2018-02-25: 3 [IU] via SUBCUTANEOUS
  Administered 2018-02-25 – 2018-02-26 (×3): 2 [IU] via SUBCUTANEOUS
  Administered 2018-02-26: 1 [IU] via SUBCUTANEOUS

## 2018-02-22 MED ORDER — INSULIN ASPART 100 UNIT/ML ~~LOC~~ SOLN
0.0000 [IU] | Freq: Every day | SUBCUTANEOUS | Status: DC
  Administered 2018-02-23 – 2018-02-25 (×2): 2 [IU] via SUBCUTANEOUS

## 2018-02-22 MED ORDER — ACETAMINOPHEN 160 MG/5ML PO SOLN: 650.0000 mg | ORAL | Status: DC | PRN

## 2018-02-22 MED ORDER — STROKE: EARLY STAGES OF RECOVERY BOOK
Freq: Once | Status: AC
  Administered 2018-02-22

## 2018-02-22 MED ORDER — AMLODIPINE BESYLATE 5 MG PO TABS
5.0000 mg | ORAL_TABLET | Freq: Two times a day (BID) | ORAL | Status: DC
  Administered 2018-02-23 – 2018-02-26 (×7): 5 mg via ORAL
  Filled 2018-02-22 (×7): qty 1

## 2018-02-22 MED ORDER — SODIUM CHLORIDE 0.9 % IV BOLUS: 1000.0000 mL | Freq: Once | INTRAVENOUS | Status: DC

## 2018-02-22 MED ORDER — ACETAMINOPHEN 650 MG RE SUPP: 650.0000 mg | RECTAL | Status: DC | PRN

## 2018-02-22 NOTE — ED Notes (Signed)
Report attempted 

## 2018-02-22 NOTE — H&P (Signed)
Charlene Greene ZOX:[id number] DOB: 1922/10/01 DOA: 02/22/2018     PCP: Almetta Lovely, [facility]   Outpatient Specialists:   CARDS:  Dr. Willeen Cass in [place]   NEurology    none   Patient arrived to ER on 02/22/18 at 1303  Patient coming from: From facility [facility]    Chief Complaint:  Chief Complaint  Patient presents with  . Altered Mental Status    HPI: Charlene Greene is a 84 y.o. female with medical history significant of CVA 2017, DM 2, HTN, sick sinus syndrome, sp pacemaker, diastolic CHF    Presented with   Left side weakness, slurred speech and facial droop similar to prior presentation of CVA in 2107 Last seen normal 5 PM last night Found today left side weakness and droopy face and slurred speech. The weakness and facial droop improved, Reports 1 week of cough but no fever or chills no wheezing. Sometimes worse with eating. Patient had dysphasia with prior CVA but it resolved and she is on regular diet.  Hx of sick sinus syndrome sp pacemaker.  Discussed plan of care with family given advanced age and inability to get MRI unlikely to benefit from extensive CVA workup.  Instead will concentrate on rehydration and physical therapy   Regarding pertinent Chronic problems: hx of   Right basal ganglia infarct secondary to small vessel disease. Sick sinus syndrome sp pacemaker. ECHO 2017 EF 70% grade 1 diastolic CHF     While in ER:  Ordered pacemaker Following Medications were ordered in ER: Medications  sodium chloride 0.9 % bolus 1,000 mL (has no administration in time range)    Significant initial  Findings: Abnormal Labs Reviewed  COMPREHENSIVE METABOLIC PANEL - Abnormal; Notable for the following components:      Result Value   Chloride 99 (*)    Glucose, Bld 176 (*)    BUN 24 (*)    Creatinine, Ser 1.11 (*)    Total Protein 6.1 (*)    GFR calc non Af Amer 41 (*)    GFR calc Af Amer 48 (*)    All other components within  normal limits  I-STAT CHEM 8, ED - Abnormal; Notable for the following components:   Chloride 100 (*)    BUN 27 (*)    Creatinine, Ser 1.20 (*)    Glucose, Bld 173 (*)    Calcium, Ion 1.08 (*)    All other components within normal limits     Na 138 K 4.3  Cr   Up from baseline see below Lab Results  Component Value Date   CREATININE 1.20 (H) 02/22/2018   CREATININE 1.11 (H) 02/22/2018   CREATININE 1.06 (H) 10/11/2016      WBC  8.0  HG/HCT      Component Value Date/Time   HGB 13.6 02/22/2018 1355   HCT 40.0 02/22/2018 1355       Troponin (Point of Care Test) Recent Labs    02/22/18 1353  TROPIPOC 0.00       BNP (last 3 results) Recent Labs    02/22/18 1318  BNP 92.2    ProBNP (last 3 results) No results for input(s): PROBNP in the last 8760 hours.  Lactic Acid, Venous No results found for: LATICACIDVEN    UA rare bacteria and few white blood cells will obtain urine culture    CT HEAD NON acute  CXR - NON acute    ECG:  Personally reviewed by me showing: HR :  63 Rhythm:   Paced  no evidence of ischemic changes QTC 421       ED Triage Vitals  Enc Vitals Group     BP 02/22/18 1315 (!) 146/75     Pulse Rate 02/22/18 1315 61     Resp 02/22/18 1315 16     Temp 02/22/18 1315 98 F (36.7 C)     Temp Source 02/22/18 1315 Oral     SpO2 02/22/18 1315 98 %     Weight 02/22/18 1316 181 lb (82.1 kg)     Height 02/22/18 1316 5\' 5"  (1.651 m)     Head Circumference --      Peak Flow --      Pain Score 02/22/18 1316 0     Pain Loc --      Pain Edu? --      Excl. in GC? --   TMAX(24)@       Latest  Blood pressure (!) 173/63, pulse 63, temperature (!) 97.1 F (36.2 C), temperature source Oral, resp. rate 20, height 5\' 5"  (1.651 m), weight 82.1 kg (181 lb), SpO2 91 %.     ER Provider Called:   Neurology  Dr.Lindzen They Recommend no MRI at this point concentrate on medical management and reversal of dehydration Reconsult if  needed  ER Provider Called:   Cardiology  Hospitalist was called for admission for possible TIA versus dehydration  Review of Systems:    Pertinent positives include: Cough weakness, gait abnormality,  slurred speech,  confusion  Constitutional:  No weight loss, night sweats, Fevers, chills, fatigue, weight loss  HEENT:  No headaches, Difficulty swallowing,Tooth/dental problems,Sore throat,  No sneezing, itching, ear ache, nasal congestion, post nasal drip,  Cardio-vascular:  No chest pain, Orthopnea, PND, anasarca, dizziness, palpitations.no Bilateral lower extremity swelling  GI:  No heartburn, indigestion, abdominal pain, nausea, vomiting, diarrhea, change in bowel habits, loss of appetite, melena, blood in stool, hematemesis Resp:  no shortness of breath at rest. No dyspnea on exertion, No excess mucus, no productive cough, No non-productive cough, No coughing up of blood.No change in color of mucus.No wheezing. Skin:  no rash or lesions. No jaundice GU:  no dysuria, change in color of urine, no urgency or frequency. No straining to urinate.  No flank pain.  Musculoskeletal:  No joint pain or no joint swelling. No decreased range of motion. No back pain.  Psych:  No change in mood or affect. No depression or anxiety. No memory loss.  Neuro: , no tingling, no   double vision, no   As per HPI otherwise 10 point review of systems negative.   Past Medical History:   Past Medical History:  Diagnosis Date  . Diabetes mellitus without complication (HCC)   . Hypertension   . Pacemaker   . Stroke [facility])    2 previous cva's      Past Surgical History:  Procedure Laterality Date  . PACEMAKER PLACEMENT      Social History:    trouble with  Dan Humphreys but tries to use as needed     reports that she has never smoked. She has never used smokeless tobacco. She reports that she does not drink alcohol or use drugs.     Family History:   Family History  Problem Relation Age  of Onset  . Lung cancer Sister   . Hypertension Other   . CAD Neg Hx   . Stroke Neg Hx     Allergies: Allergies  Allergen  Reactions  . Statins Other (See Comments)    Intol to numerous statins still feels has some muscle weakness, myalgia  . Other Other (See Comments)    Unknown reaction to dye (listed on [facility] 02/22/18)  . Amlodipine Other (See Comments)    unknown  . Celecoxib Nausea Only and Other (See Comments)  . Codeine Other (See Comments)    unknown  . Naproxen Other (See Comments)    unknown  . Piroxicam Other (See Comments)    unknown     Prior to Admission medications   Medication Sig Start Date End Date Taking? Authorizing Provider  acetaminophen (TYLENOL) 500 MG tablet Take 500 mg by mouth 2 (two) times daily.   Yes [provider]  amLODipine (NORVASC) 5 MG tablet Take 5 mg by mouth 2 (two) times daily.   Yes [provider]  aspirin 325 MG tablet Take 325 mg by mouth daily.   Yes [provider]  busPIRone (BUSPAR) 5 MG tablet Take 1 tablet (5 mg total) by mouth 2 (two) times daily. 10/11/16  Yes Angiulli, Mcarthur Rossetti, PA-C  cholecalciferol (VITAMIN D) 1000 units tablet Take 1,000 Units by mouth daily.   Yes [provider]  fenofibrate 160 MG tablet Take 1 tablet (160 mg total) by mouth daily. 10/11/16  Yes Angiulli, Mcarthur Rossetti, PA-C  furosemide (LASIX) 20 MG tablet Take 10 mg by mouth daily.   Yes [provider]  gabapentin (NEURONTIN) 100 MG capsule Take 200 mg by mouth at bedtime.   Yes [provider]  Hexylresorcinol (THROAT LOZENGES MT) Use as directed 1 lozenge in the mouth or throat every 6 (six) hours as needed (sore throat (max 4 tabs/24 hours)).   Yes [provider]  insulin aspart (NOVOLOG) 100 UNIT/ML injection Inject 2-12 Units into the skin 4 (four) times daily as needed for high blood sugar (CBG >200). Per sliding scale: CBG 201-250 2 units, 251-300 4 units, 301-350 6 units, 351-400 8 units,  401-450 10 units, 451-500 12 units, >500 call 911   Yes [provider]  isosorbide mononitrate (IMDUR) 60 MG 24 hr tablet Take 1 tablet (60 mg total) by mouth daily. 10/11/16  Yes Angiulli, Mcarthur Rossetti, PA-C  lisinopril (PRINIVIL,ZESTRIL) 40 MG tablet Take 1 tablet (40 mg total) by mouth 2 (two) times daily. Patient taking differently: Take 40 mg by mouth daily.  10/11/16  Yes Angiulli, Mcarthur Rossetti, PA-C  Magnesium 250 MG TABS Take 250 mg by mouth at bedtime.   Yes [provider]  metoprolol succinate (TOPROL-XL) 100 MG 24 hr tablet Take 1 tablet (100 mg total) by mouth daily. 10/11/16  Yes Angiulli, Mcarthur Rossetti, PA-C  nitroGLYCERIN (NITROSTAT) 0.4 MG SL tablet Place 0.4 mg under the tongue every 5 (five) minutes as needed for chest pain (max 3 doses in 15 minutes).   Yes [provider]  psyllium (REGULOID) 0.52 g capsule Take 0.52 g by mouth at bedtime.   Yes [provider]  ranolazine (RANEXA) 500 MG 12 hr tablet Take 1 tablet (500 mg total) by mouth 2 (two) times daily. 10/11/16  Yes Angiulli, Mcarthur Rossetti, PA-C  senna (SENOKOT) 8.6 MG TABS tablet Take 1 tablet by mouth daily as needed (constipation).   Yes [provider]  amLODipine (NORVASC) 10 MG tablet Take 1 tablet (10 mg total) by mouth 2 (two) times daily. Patient not taking: Reported on 02/22/2018 10/11/16   Angiulli, Mcarthur Rossetti, PA-C  clopidogrel (PLAVIX) 75 MG tablet Take  1 tablet (75 mg total) by mouth daily. Patient not taking: Reported on 02/22/2018 10/11/16   Angiulli, Mcarthur Rossetti, PA-C  ezetimibe (ZETIA) 10 MG tablet Take 1 tablet (10 mg total) by mouth daily. Patient not taking: Reported on 02/22/2018 10/11/16   Angiulli, Mcarthur Rossetti, PA-C  furosemide (LASIX) 40 MG tablet Take 1 tablet (40 mg total) by mouth 2 (two) times daily. Patient not taking: Reported on 02/22/2018 10/11/16   Angiulli, Mcarthur Rossetti, PA-C  glimepiride (AMARYL) 2 MG tablet Take 1 tablet (2 mg total) by mouth 2 (two) times daily with a  meal. Patient not taking: Reported on 02/22/2018 10/11/16   Angiulli, Mcarthur Rossetti, PA-C  hydrALAZINE (APRESOLINE) 25 MG tablet Take 3 tablets (75 mg total) by mouth every 8 (eight) hours. Patient not taking: Reported on 02/22/2018 10/11/16   Angiulli, Mcarthur Rossetti, PA-C  Multiple Vitamins-Minerals (PRESERVISION AREDS 2) CAPS Take 1 capsule by mouth 2 (two) times daily. Patient not taking: Reported on 02/22/2018 10/11/16   Angiulli, Mcarthur Rossetti, PA-C  niacin 250 MG tablet Take 1 tablet (250 mg total) by mouth every morning. Patient not taking: Reported on 02/22/2018 10/11/16   Angiulli, Mcarthur Rossetti, PA-C  potassium chloride SA (K-DUR,KLOR-CON) 20 MEQ tablet Take 1 tablet (20 mEq total) by mouth 2 (two) times daily. Patient not taking: Reported on 02/22/2018 10/11/16   Angiulli, Mcarthur Rossetti, PA-C  pregabalin (LYRICA) 75 MG capsule Take 1 capsule (75 mg total) by mouth 2 (two) times daily. Patient not taking: Reported on 02/22/2018 10/11/16   Angiulli, Mcarthur Rossetti, PA-C  senna-docusate (SENOKOT-S) 8.6-50 MG tablet Take 2 tablets by mouth 2 (two) times daily. Patient not taking: Reported on 02/22/2018 10/11/16   Angiulli, Mcarthur Rossetti, PA-C   Physical Exam: Blood pressure (!) 173/63, pulse 63, temperature (!) 97.1 F (36.2 C), temperature source Oral, resp. rate 20, height 5\' 5"  (1.651 m), weight 82.1 kg (181 lb), SpO2 91 %. 1. General:  in No Acute distress   Chronically ill, elderly  -appearing 2. Psychological: Alert and   Oriented 3. Head/ENT:    Dry Mucous Membranes                          Head Non traumatic, neck supple                            Poor Dentition 4. SKIN:   decreased Skin turgor,  Skin clean Dry and intact redness over left side of the face. 5. Heart: Regular rate and rhythm no Murmur, no Rub or gallop 6. Lungs: , no wheezes or crackles   7. Abdomen: Soft,  non-tender, Non distended  bowel sounds present 8. Lower extremities: no clubbing, cyanosis, or edema 9. Neurologically   strength 5 out of 5 in  all 4 extremities cranial nerves II through XII intact 10. MSK: Normal range of motion   LABS:     Recent Labs  Lab 02/22/18 1318 02/22/18 1355  WBC 8.0  --   NEUTROABS 5.5  --   HGB 13.5 13.6  HCT 40.6 40.0  MCV 88.5  --   PLT 272  --    Basic Metabolic Panel: Recent Labs  Lab 02/22/18 1318 02/22/18 1355  NA 136 138  K 4.2 4.3  CL 99* 100*  CO2 27  --   GLUCOSE 176* 173*  BUN 24* 27*  CREATININE 1.11* 1.20*  CALCIUM 9.1  --  Recent Labs  Lab 02/22/18 1318  AST 23  ALT 19  ALKPHOS 50  BILITOT 0.7  PROT 6.1*  ALBUMIN 3.8   No results for input(s): LIPASE, AMYLASE in the last 168 hours. No results for input(s): AMMONIA in the last 168 hours.    HbA1C: No results for input(s): HGBA1C in the last 72 hours. CBG: No results for input(s): GLUCAP in the last 168 hours.    Urine analysis:    Component Value Date/Time   COLORURINE YELLOW 02/22/2018 2000   APPEARANCEUR CLEAR 02/22/2018 2000   LABSPEC 1.013 02/22/2018 2000   PHURINE 6.0 02/22/2018 2000   GLUCOSEU 50 (A) 02/22/2018 2000   HGBUR NEGATIVE 02/22/2018 2000   BILIRUBINUR NEGATIVE 02/22/2018 2000   KETONESUR NEGATIVE 02/22/2018 2000   PROTEINUR NEGATIVE 02/22/2018 2000   NITRITE NEGATIVE 02/22/2018 2000   LEUKOCYTESUR MODERATE (A) 02/22/2018 2000      Cultures:    Component Value Date/Time   SDES URINE, CLEAN CATCH 10/10/2016 0918   SPECREQUEST NONE 10/10/2016 0918   CULT <10,000 COLONIES/mL INSIGNIFICANT GROWTH (A) 10/10/2016 0918   REPTSTATUS 10/11/2016 FINAL 10/10/2016 0918     Radiological Exams on Admission: Dg Chest 2 View  Result Date: 02/22/2018 CLINICAL DATA:  Left-sided weakness and slurred speech EXAM: CHEST - 2 VIEW COMPARISON:  September 28, 2016 FINDINGS: There is no edema or consolidation. Heart size and pulmonary vascularity are normal. Pacemaker leads are attached to the right atrium and right ventricle. There is aortic atherosclerosis. No adenopathy. There is  degenerative change in both shoulders. Remodeling of the left humeral head is indicative of chronic avascular necrosis. IMPRESSION: Pacemaker leads attached right atrium and right ventricle. Heart size normal. There is aortic atherosclerosis. No edema or consolidation. There is avascular necrosis in the left humeral head. Aortic Atherosclerosis (ICD10-I70.0). Electronically Signed   By: Bretta BangWilliam  Woodruff III M.D.   On: 02/22/2018 16:02   Ct Head Wo Contrast  Result Date: 02/22/2018 CLINICAL DATA:  Slurred speech with left-sided facial droop and weakness. EXAM: CT HEAD WITHOUT CONTRAST TECHNIQUE: Contiguous axial images were obtained from the base of the skull through the vertex without intravenous contrast. COMPARISON:  CTA head and neck dated September 24, 2016. FINDINGS: Brain: No evidence of acute infarction, hemorrhage, hydrocephalus, extra-axial collection or mass lesion/mass effect. Old lacunar infarct in the right basal ganglia. Stable cerebral atrophy and chronic microvascular ischemic changes. Vascular: Calcified atherosclerosis at the skullbase. No hyperdense vessel. Skull: Negative for fracture or focal lesion. Sinuses/Orbits: No acute finding. Other: None. IMPRESSION: 1.  No acute intracranial abnormality. Electronically Signed   By: Obie DredgeWilliam T Derry M.D.   On: 02/22/2018 14:14    Chart has been reviewed    Assessment/Plan  84 y.o. female with medical history significant of CVA, DM 2, HTN, sick sinus syndrome, sp pacemaker, diastolic CHF     Admitted for TIA  Present on Admission:  . TIA (transient ischemic attack) - hold off on MRI given Pacemaker in place, monitor on telemetry monitor for any progression of symptoms or recurrence, medical management as per discussion with family. Of note patient used to be on Plavix but seems it currently has been discontinued.  . Stage 3 chronic kidney disease (HCC) stable continue to monitor avoid nephrotoxic medications . Sick sinus syndrome  (HCC) status post pacemaker pacemaker attempted to interrogate in the emergency department  . Hyperlipidemia - check lipid panel restart home meds once passess swallow eval Dysphasia - SLP evaluation, for now keep NPO . Benign  essential HTN -allow permissive hypertension for tonight restart home medications tomorrow when able to tolerate p.o.  DM 2 -  - Order Sensitive SSI   -  check TSH and HgA1C      Other plan as per orders.  DVT prophylaxis:  SCD   Code Status:   DNR/DNI  as per patient   I had personally discussed CODE STATUS with patient   Family Communication:   Family   at  Bedside  plan of care was discussed with   Son  Disposition Plan:    Back to current facility when stable                                                 Would benefit from PT/OT eval prior to DC   ordered                    Social Work   consulted                          Consults called:  email cardiology  Admission status:   obs   Level of care     tele          Therisa Doyne 02/22/2018, 9:07 PM    [facility]  Pager [phone]   after 2 AM please page floor coverage PA If 7AM-7PM, please contact the day team taking care of the patient  [email or web address]  Password [email or web address]

## 2018-02-22 NOTE — ED Provider Notes (Signed)
[facility] EMERGENCY DEPARTMENT Provider Note   CSN: [id number] Arrival date & time: 02/22/18  1303     History   Chief Complaint Chief Complaint  Patient presents with  . Altered Mental Status    HPI Charlene Greene is a 84 y.o. female.  HPI 84 year old female with history of hypertension, diabetes, history of stroke x2, here with transient altered mental status.  The patient reportedly was slightly more confused than usual last night.  Family last saw her normal around 5:00.  When they called her in the afternoon, she had slightly slurred speech and seemed "out of it."  When they arrived this morning, the patient was confused.  She had a right-sided facial droop.  She also had difficulty walking with left-sided weakness.  This seems to have resolved by the time she presents to the ED.  Currently, the patient denies any complaints other than a cough which she states is worsened over the last 12-24 hours.  She is had persistent cough and some occasional shortness of breath.  No sputum production.  No fevers or chills.  Currently, she denies any headache or visual changes.  Denies any new focal numbness or weakness.  Of note, she did have similar symptoms with her previous strokes and has had similar symptoms with UTI in the past.  Past Medical History:  Diagnosis Date  . Diabetes mellitus without complication (HCC)   . Hypertension   . Pacemaker   . Stroke [facility])    2 previous cva's    Patient Active Problem List   Diagnosis Date Noted  . TIA (transient ischemic attack) 02/22/2018  . Labile blood glucose   . Hypotension due to drugs   . Hyponatremia   . Renovascular hypertension   . Hypertensive crisis   . Type 2 diabetes mellitus with diabetic autonomic neuropathy, without long-term current use of insulin (HCC)   . Stage 3 chronic kidney disease (HCC)   . Acute lower UTI   . Acute renal insufficiency 09/27/2016  . Urinary urgency 09/27/2016  . Basal ganglia  infarction (HCC) 09/26/2016  . Neuropathic pain   . Benign essential HTN   . Slow transit constipation   . Elevated creatine kinase   . Accelerated hypertension 09/24/2016  . Diabetes mellitus (HCC) 09/24/2016  . Hyperlipidemia 09/24/2016  . Sick sinus syndrome (HCC) 09/24/2016  . CVA (cerebral vascular accident) (HCC) 09/23/2016  . Stroke (cerebrum) (HCC) 09/23/2016    Past Surgical History:  Procedure Laterality Date  . PACEMAKER PLACEMENT       OB History    Gravida      Para      Term      Preterm      AB      Living  2     SAB      TAB      Ectopic      Multiple      Live Births               Home Medications    Prior to Admission medications   Medication Sig Start Date End Date Taking? Authorizing Provider  acetaminophen (TYLENOL) 500 MG tablet Take 500 mg by mouth 2 (two) times daily.   Yes [provider]  amLODipine (NORVASC) 5 MG tablet Take 5 mg by mouth 2 (two) times daily.   Yes [provider]  aspirin 325 MG tablet Take 325 mg by mouth daily.   Yes [provider]  busPIRone (BUSPAR) 5 MG tablet Take 1 tablet (5 mg total) by mouth 2 (two) times daily. 10/11/16  Yes Angiulli, Mcarthur Rossetti, PA-C  cholecalciferol (VITAMIN D) 1000 units tablet Take 1,000 Units by mouth daily.   Yes [provider]  fenofibrate 160 MG tablet Take 1 tablet (160 mg total) by mouth daily. 10/11/16  Yes Angiulli, Mcarthur Rossetti, PA-C  furosemide (LASIX) 20 MG tablet Take 10 mg by mouth daily.   Yes [provider]  gabapentin (NEURONTIN) 100 MG capsule Take 200 mg by mouth at bedtime.   Yes [provider]  Hexylresorcinol (THROAT LOZENGES MT) Use as directed 1 lozenge in the mouth or throat every 6 (six) hours as needed (sore throat (max 4 tabs/24 hours)).   Yes [provider]  insulin aspart (NOVOLOG) 100 UNIT/ML injection Inject 2-12 Units into the skin 4 (four) times daily as needed for high blood sugar (CBG  >200). Per sliding scale: CBG 201-250 2 units, 251-300 4 units, 301-350 6 units, 351-400 8 units, 401-450 10 units, 451-500 12 units, >500 call 911   Yes [provider]  isosorbide mononitrate (IMDUR) 60 MG 24 hr tablet Take 1 tablet (60 mg total) by mouth daily. 10/11/16  Yes Angiulli, Mcarthur Rossetti, PA-C  lisinopril (PRINIVIL,ZESTRIL) 40 MG tablet Take 1 tablet (40 mg total) by mouth 2 (two) times daily. Patient taking differently: Take 40 mg by mouth daily.  10/11/16  Yes Angiulli, Mcarthur Rossetti, PA-C  Magnesium 250 MG TABS Take 250 mg by mouth at bedtime.   Yes [provider]  metoprolol succinate (TOPROL-XL) 100 MG 24 hr tablet Take 1 tablet (100 mg total) by mouth daily. 10/11/16  Yes Angiulli, Mcarthur Rossetti, PA-C  nitroGLYCERIN (NITROSTAT) 0.4 MG SL tablet Place 0.4 mg under the tongue every 5 (five) minutes as needed for chest pain (max 3 doses in 15 minutes).   Yes [provider]  psyllium (REGULOID) 0.52 g capsule Take 0.52 g by mouth at bedtime.   Yes [provider]  ranolazine (RANEXA) 500 MG 12 hr tablet Take 1 tablet (500 mg total) by mouth 2 (two) times daily. 10/11/16  Yes Angiulli, Mcarthur Rossetti, PA-C  senna (SENOKOT) 8.6 MG TABS tablet Take 1 tablet by mouth daily as needed (constipation).   Yes [provider]  amLODipine (NORVASC) 10 MG tablet Take 1 tablet (10 mg total) by mouth 2 (two) times daily. Patient not taking: Reported on 02/22/2018 10/11/16   Angiulli, Mcarthur Rossetti, PA-C  clopidogrel (PLAVIX) 75 MG tablet Take 1 tablet (75 mg total) by mouth daily. Patient not taking: Reported on 02/22/2018 10/11/16   Angiulli, Mcarthur Rossetti, PA-C  ezetimibe (ZETIA) 10 MG tablet Take 1 tablet (10 mg total) by mouth daily. Patient not taking: Reported on 02/22/2018 10/11/16   Angiulli, Mcarthur Rossetti, PA-C  furosemide (LASIX) 40 MG tablet Take 1 tablet (40 mg total) by mouth 2 (two) times daily. Patient not taking: Reported on 02/22/2018 10/11/16   Angiulli, Mcarthur Rossetti, PA-C    glimepiride (AMARYL) 2 MG tablet Take 1 tablet (2 mg total) by mouth 2 (two) times daily with a meal. Patient not taking: Reported on 02/22/2018 10/11/16   Angiulli, Mcarthur Rossetti, PA-C  hydrALAZINE (APRESOLINE) 25 MG tablet Take 3 tablets (75 mg total) by mouth every 8 (eight) hours. Patient not taking: Reported on 02/22/2018 10/11/16   Angiulli, Mcarthur Rossetti, PA-C  Multiple Vitamins-Minerals (PRESERVISION AREDS 2) CAPS Take 1 capsule by mouth 2 (two) times daily. Patient not taking:  Reported on 02/22/2018 10/11/16   Angiulli, Mcarthur Rossetti, PA-C  niacin 250 MG tablet Take 1 tablet (250 mg total) by mouth every morning. Patient not taking: Reported on 02/22/2018 10/11/16   Angiulli, Mcarthur Rossetti, PA-C  potassium chloride SA (K-DUR,KLOR-CON) 20 MEQ tablet Take 1 tablet (20 mEq total) by mouth 2 (two) times daily. Patient not taking: Reported on 02/22/2018 10/11/16   Angiulli, Mcarthur Rossetti, PA-C  pregabalin (LYRICA) 75 MG capsule Take 1 capsule (75 mg total) by mouth 2 (two) times daily. Patient not taking: Reported on 02/22/2018 10/11/16   Angiulli, Mcarthur Rossetti, PA-C  senna-docusate (SENOKOT-S) 8.6-50 MG tablet Take 2 tablets by mouth 2 (two) times daily. Patient not taking: Reported on 02/22/2018 10/11/16   Angiulli, Mcarthur Rossetti, PA-C    Family History Family History  Problem Relation Age of Onset  . Lung cancer Sister   . Hypertension Other   . CAD Neg Hx   . Stroke Neg Hx     Social History Social History   Tobacco Use  . Smoking status: Never Smoker  . Smokeless tobacco: Never Used  Substance Use Topics  . Alcohol use: No  . Drug use: Never     Allergies   Statins; Other; Amlodipine; Celecoxib; Codeine; Naproxen; and Piroxicam   Review of Systems Review of Systems  Constitutional: Positive for fatigue. Negative for chills and fever.  HENT: Negative for congestion and rhinorrhea.   Eyes: Negative for visual disturbance.  Respiratory: Negative for cough, shortness of breath and wheezing.    Cardiovascular: Negative for chest pain and leg swelling.  Gastrointestinal: Negative for abdominal pain, diarrhea, nausea and vomiting.  Genitourinary: Negative for dysuria and flank pain.  Musculoskeletal: Positive for gait problem. Negative for neck pain and neck stiffness.  Skin: Negative for rash and wound.  Allergic/Immunologic: Negative for immunocompromised state.  Neurological: Positive for weakness. Negative for syncope and headaches.  Psychiatric/Behavioral: Positive for confusion.  All other systems reviewed and are negative.    Physical Exam Updated Vital Signs BP (!) 157/75   Pulse 63   Temp (!) 97.1 F (36.2 C) (Oral)   Resp 20   Ht 5\' 5"  (1.651 m)   Wt 82.1 kg (181 lb)   SpO2 91%   BMI 30.12 kg/m   Physical Exam  Constitutional: She is oriented to person, place, and time. She appears well-developed and well-nourished. No distress.  HENT:  Head: Normocephalic and atraumatic.  Eyes: Conjunctivae are normal.  Neck: Neck supple.  Cardiovascular: Normal rate, regular rhythm and normal heart sounds. Exam reveals no friction rub.  No murmur heard. Pulmonary/Chest: Effort normal and breath sounds normal. No respiratory distress. She has no wheezes. She has no rales.  Abdominal: She exhibits no distension.  Musculoskeletal: She exhibits no edema.  Neurological: She is alert and oriented to person, place, and time. She exhibits normal muscle tone.  Skin: Skin is warm. Capillary refill takes less than 2 seconds.  Psychiatric: She has a normal mood and affect.  Nursing note and vitals reviewed.   Neurological Exam:  Mental Status: Alert and oriented to person, place but not time. Attention and concentration normal. Speech mildly dysarthric. Recent memory is intact. Cranial Nerves: Visual fields grossly intact. EOMI and PERRLA. No nystagmus noted. Facial sensation intact at forehead, maxillary cheek, and chin/mandible bilaterally. No facial asymmetry or weakness.  Hearing grossly normal. Uvula is midline, and palate elevates symmetrically. Normal SCM and trapezius strength. Tongue midline without fasciculations. Motor: Muscle strength 5/5 in proximal and  distal UE and LE bilaterally, though subjectively weaker on the left. No pronator drift. Muscle tone normal. Reflexes: 2+ and symmetrical in all four extremities.  Sensation: Intact to light touch in upper and lower extremities distally bilaterally.  Gait: Normal without ataxia. Coordination: Normal FTN bilaterally.      ED Treatments / Results  Labs (all labs ordered are listed, but only abnormal results are displayed) Labs Reviewed  COMPREHENSIVE METABOLIC PANEL - Abnormal; Notable for the following components:      Result Value   Chloride 99 (*)    Glucose, Bld 176 (*)    BUN 24 (*)    Creatinine, Ser 1.11 (*)    Total Protein 6.1 (*)    GFR calc non Af Amer 41 (*)    GFR calc Af Amer 48 (*)    All other components within normal limits  URINALYSIS, ROUTINE W REFLEX MICROSCOPIC - Abnormal; Notable for the following components:   Glucose, UA 50 (*)    Leukocytes, UA MODERATE (*)    Bacteria, UA RARE (*)    Squamous Epithelial / LPF 0-5 (*)    All other components within normal limits  I-STAT CHEM 8, ED - Abnormal; Notable for the following components:   Chloride 100 (*)    BUN 27 (*)    Creatinine, Ser 1.20 (*)    Glucose, Bld 173 (*)    Calcium, Ion 1.08 (*)    All other components within normal limits  URINE CULTURE  PROTIME-INR  APTT  CBC  DIFFERENTIAL  BRAIN NATRIURETIC PEPTIDE  I-STAT TROPONIN, ED    EKG EKG Interpretation  Date/Time:  Saturday February 22 2018 13:20:54 EDT Ventricular Rate:  63 PR Interval:  228 QRS Duration: 88 QT Interval:  412 QTC Calculation: 421 R Axis:   -36 Text Interpretation:  Atrial-paced rhythm with prolonged AV conduction Left axis deviation Anterolateral infarct , age undetermined Abnormal ECG No significant change since last tracing  Confirmed by Shaune PollackIsaacs, Vianka Ertel [phone]([id number]) on 02/22/2018 3:53:09 PM   Radiology Dg Chest 2 View  Result Date: 02/22/2018 CLINICAL DATA:  Left-sided weakness and slurred speech EXAM: CHEST - 2 VIEW COMPARISON:  September 28, 2016 FINDINGS: There is no edema or consolidation. Heart size and pulmonary vascularity are normal. Pacemaker leads are attached to the right atrium and right ventricle. There is aortic atherosclerosis. No adenopathy. There is degenerative change in both shoulders. Remodeling of the left humeral head is indicative of chronic avascular necrosis. IMPRESSION: Pacemaker leads attached right atrium and right ventricle. Heart size normal. There is aortic atherosclerosis. No edema or consolidation. There is avascular necrosis in the left humeral head. Aortic Atherosclerosis (ICD10-I70.0). Electronically Signed   By: Bretta BangWilliam  Woodruff III M.D.   On: 02/22/2018 16:02   Ct Head Wo Contrast  Result Date: 02/22/2018 CLINICAL DATA:  Slurred speech with left-sided facial droop and weakness. EXAM: CT HEAD WITHOUT CONTRAST TECHNIQUE: Contiguous axial images were obtained from the base of the skull through the vertex without intravenous contrast. COMPARISON:  CTA head and neck dated September 24, 2016. FINDINGS: Brain: No evidence of acute infarction, hemorrhage, hydrocephalus, extra-axial collection or mass lesion/mass effect. Old lacunar infarct in the right basal ganglia. Stable cerebral atrophy and chronic microvascular ischemic changes. Vascular: Calcified atherosclerosis at the skullbase. No hyperdense vessel. Skull: Negative for fracture or focal lesion. Sinuses/Orbits: No acute finding. Other: None. IMPRESSION: 1.  No acute intracranial abnormality. Electronically Signed   By: Obie DredgeWilliam T Derry M.D.   On: 02/22/2018 14:14  Procedures Procedures (including critical care time)  Medications Ordered in ED Medications  sodium chloride 0.9 % bolus 1,000 mL (has no administration in time range)      Initial Impression / Assessment and Plan / ED Course  I have reviewed the triage vital signs and the nursing notes.  Pertinent labs & imaging results that were available during my care of the patient were reviewed by me and considered in my medical decision making (see chart for details).     84 year old female here with altered mental status and transient facial droop, slurred speech, and left-sided weakness.  She does have some mild slurred speech on exam here.  Concern for possible recurrent stroke, versus worsening of her underlying stroke deficits in the setting of possible UTI or other infectious process.  Imaging initially obtained and is negative.  She is outside of the window for any kind of stroke intervention.  Will consult neurology and admit.  Urinalysis is pending.  Discussed with neurology Dr. Otelia Limes.  He is reviewed lab work.  He suggests IV fluids, treatment of possible UTI once UA is back, and medical treatment.  If symptoms persist and do not return, he would recommend treating is likely reactivation of old stroke without further MRI or stroke workup.  Final Clinical Impressions(s) / ED Diagnoses   Final diagnoses:  Stroke-like symptom    ED Discharge Orders    None       Shaune Pollack, MD 02/22/18 2108

## 2018-02-22 NOTE — ED Notes (Signed)
Amelya Mabry with [employer] called to report no arrhythmias since3 last check in December.  Device functioning as programmed.  MD made aware.

## 2018-02-22 NOTE — ED Triage Notes (Addendum)
Pt from [facility] with family for slurred speech, left side facial droop and left side weakness. Pt LSN at dinner last night around 1700. Sx discovered this morning by staff and family. Pt's daughter in law states that speech has improved but still slightly slurred. Pt is oriented to self and place but not to time and that is abnormal for her. VSS. No drift or aphasia. Grip slightly weaker on left side than right. Pt has hx of 2 prior cva's that affected left side but left side deficits had resolved with therapy.

## 2018-02-23 ENCOUNTER — Other Ambulatory Visit: Payer: Self-pay

## 2018-02-23 ENCOUNTER — Inpatient Hospital Stay (HOSPITAL_COMMUNITY): Payer: Medicare Other

## 2018-02-23 ENCOUNTER — Encounter (HOSPITAL_COMMUNITY): Payer: Self-pay | Admitting: Internal Medicine

## 2018-02-23 DIAGNOSIS — G479 Sleep disorder, unspecified: Secondary | ICD-10-CM | POA: Diagnosis not present

## 2018-02-23 DIAGNOSIS — Z951 Presence of aortocoronary bypass graft: Secondary | ICD-10-CM | POA: Diagnosis not present

## 2018-02-23 DIAGNOSIS — I110 Hypertensive heart disease with heart failure: Secondary | ICD-10-CM | POA: Diagnosis present

## 2018-02-23 DIAGNOSIS — Z833 Family history of diabetes mellitus: Secondary | ICD-10-CM | POA: Diagnosis not present

## 2018-02-23 DIAGNOSIS — Z794 Long term (current) use of insulin: Secondary | ICD-10-CM | POA: Diagnosis not present

## 2018-02-23 DIAGNOSIS — E1142 Type 2 diabetes mellitus with diabetic polyneuropathy: Secondary | ICD-10-CM | POA: Diagnosis not present

## 2018-02-23 DIAGNOSIS — Z7902 Long term (current) use of antithrombotics/antiplatelets: Secondary | ICD-10-CM | POA: Diagnosis not present

## 2018-02-23 DIAGNOSIS — Z8673 Personal history of transient ischemic attack (TIA), and cerebral infarction without residual deficits: Secondary | ICD-10-CM | POA: Diagnosis not present

## 2018-02-23 DIAGNOSIS — I739 Peripheral vascular disease, unspecified: Secondary | ICD-10-CM | POA: Diagnosis not present

## 2018-02-23 DIAGNOSIS — I130 Hypertensive heart and chronic kidney disease with heart failure and stage 1 through stage 4 chronic kidney disease, or unspecified chronic kidney disease: Secondary | ICD-10-CM | POA: Diagnosis present

## 2018-02-23 DIAGNOSIS — Z66 Do not resuscitate: Secondary | ICD-10-CM | POA: Diagnosis present

## 2018-02-23 DIAGNOSIS — I10 Essential (primary) hypertension: Secondary | ICD-10-CM

## 2018-02-23 DIAGNOSIS — I5033 Acute on chronic diastolic (congestive) heart failure: Secondary | ICD-10-CM | POA: Diagnosis not present

## 2018-02-23 DIAGNOSIS — I69398 Other sequelae of cerebral infarction: Secondary | ICD-10-CM | POA: Diagnosis present

## 2018-02-23 DIAGNOSIS — Z801 Family history of malignant neoplasm of trachea, bronchus and lung: Secondary | ICD-10-CM | POA: Diagnosis not present

## 2018-02-23 DIAGNOSIS — Z7901 Long term (current) use of anticoagulants: Secondary | ICD-10-CM | POA: Diagnosis not present

## 2018-02-23 DIAGNOSIS — R269 Unspecified abnormalities of gait and mobility: Secondary | ICD-10-CM | POA: Diagnosis present

## 2018-02-23 DIAGNOSIS — M25552 Pain in left hip: Secondary | ICD-10-CM | POA: Diagnosis present

## 2018-02-23 DIAGNOSIS — E46 Unspecified protein-calorie malnutrition: Secondary | ICD-10-CM | POA: Diagnosis not present

## 2018-02-23 DIAGNOSIS — I639 Cerebral infarction, unspecified: Secondary | ICD-10-CM

## 2018-02-23 DIAGNOSIS — N183 Chronic kidney disease, stage 3 (moderate): Secondary | ICD-10-CM | POA: Diagnosis present

## 2018-02-23 DIAGNOSIS — I69321 Dysphasia following cerebral infarction: Secondary | ICD-10-CM | POA: Diagnosis not present

## 2018-02-23 DIAGNOSIS — I63532 Cerebral infarction due to unspecified occlusion or stenosis of left posterior cerebral artery: Secondary | ICD-10-CM | POA: Diagnosis present

## 2018-02-23 DIAGNOSIS — Z853 Personal history of malignant neoplasm of breast: Secondary | ICD-10-CM | POA: Diagnosis not present

## 2018-02-23 DIAGNOSIS — R7309 Other abnormal glucose: Secondary | ICD-10-CM | POA: Diagnosis not present

## 2018-02-23 DIAGNOSIS — E782 Mixed hyperlipidemia: Secondary | ICD-10-CM | POA: Diagnosis not present

## 2018-02-23 DIAGNOSIS — G459 Transient cerebral ischemic attack, unspecified: Secondary | ICD-10-CM | POA: Diagnosis not present

## 2018-02-23 DIAGNOSIS — E1143 Type 2 diabetes mellitus with diabetic autonomic (poly)neuropathy: Secondary | ICD-10-CM | POA: Diagnosis not present

## 2018-02-23 DIAGNOSIS — S90121S Contusion of right lesser toe(s) without damage to nail, sequela: Secondary | ICD-10-CM | POA: Diagnosis not present

## 2018-02-23 DIAGNOSIS — I69391 Dysphagia following cerebral infarction: Secondary | ICD-10-CM | POA: Diagnosis not present

## 2018-02-23 DIAGNOSIS — M79609 Pain in unspecified limb: Secondary | ICD-10-CM | POA: Diagnosis not present

## 2018-02-23 DIAGNOSIS — I493 Ventricular premature depolarization: Secondary | ICD-10-CM

## 2018-02-23 DIAGNOSIS — E1151 Type 2 diabetes mellitus with diabetic peripheral angiopathy without gangrene: Secondary | ICD-10-CM | POA: Diagnosis present

## 2018-02-23 DIAGNOSIS — M7989 Other specified soft tissue disorders: Secondary | ICD-10-CM | POA: Diagnosis not present

## 2018-02-23 DIAGNOSIS — R2990 Unspecified symptoms and signs involving the nervous system: Secondary | ICD-10-CM | POA: Diagnosis present

## 2018-02-23 DIAGNOSIS — I5032 Chronic diastolic (congestive) heart failure: Secondary | ICD-10-CM | POA: Diagnosis not present

## 2018-02-23 DIAGNOSIS — I495 Sick sinus syndrome: Secondary | ICD-10-CM

## 2018-02-23 DIAGNOSIS — I2510 Atherosclerotic heart disease of native coronary artery without angina pectoris: Secondary | ICD-10-CM | POA: Diagnosis present

## 2018-02-23 DIAGNOSIS — I169 Hypertensive crisis, unspecified: Secondary | ICD-10-CM | POA: Diagnosis not present

## 2018-02-23 DIAGNOSIS — E119 Type 2 diabetes mellitus without complications: Secondary | ICD-10-CM | POA: Diagnosis not present

## 2018-02-23 DIAGNOSIS — G8194 Hemiplegia, unspecified affecting left nondominant side: Secondary | ICD-10-CM | POA: Diagnosis present

## 2018-02-23 DIAGNOSIS — R799 Abnormal finding of blood chemistry, unspecified: Secondary | ICD-10-CM | POA: Diagnosis not present

## 2018-02-23 DIAGNOSIS — R1310 Dysphagia, unspecified: Secondary | ICD-10-CM | POA: Diagnosis present

## 2018-02-23 DIAGNOSIS — Z8249 Family history of ischemic heart disease and other diseases of the circulatory system: Secondary | ICD-10-CM | POA: Diagnosis not present

## 2018-02-23 DIAGNOSIS — Z7982 Long term (current) use of aspirin: Secondary | ICD-10-CM | POA: Diagnosis not present

## 2018-02-23 DIAGNOSIS — Z79899 Other long term (current) drug therapy: Secondary | ICD-10-CM | POA: Diagnosis not present

## 2018-02-23 DIAGNOSIS — M879 Osteonecrosis, unspecified: Secondary | ICD-10-CM | POA: Diagnosis present

## 2018-02-23 DIAGNOSIS — Z888 Allergy status to other drugs, medicaments and biological substances status: Secondary | ICD-10-CM | POA: Diagnosis not present

## 2018-02-23 DIAGNOSIS — E8809 Other disorders of plasma-protein metabolism, not elsewhere classified: Secondary | ICD-10-CM | POA: Diagnosis present

## 2018-02-23 DIAGNOSIS — E785 Hyperlipidemia, unspecified: Secondary | ICD-10-CM | POA: Diagnosis present

## 2018-02-23 DIAGNOSIS — E1140 Type 2 diabetes mellitus with diabetic neuropathy, unspecified: Secondary | ICD-10-CM | POA: Diagnosis present

## 2018-02-23 DIAGNOSIS — E1121 Type 2 diabetes mellitus with diabetic nephropathy: Secondary | ICD-10-CM | POA: Diagnosis present

## 2018-02-23 DIAGNOSIS — R55 Syncope and collapse: Secondary | ICD-10-CM | POA: Diagnosis not present

## 2018-02-23 DIAGNOSIS — Z950 Presence of cardiac pacemaker: Secondary | ICD-10-CM | POA: Diagnosis not present

## 2018-02-23 DIAGNOSIS — M25551 Pain in right hip: Secondary | ICD-10-CM | POA: Diagnosis present

## 2018-02-23 DIAGNOSIS — R0989 Other specified symptoms and signs involving the circulatory and respiratory systems: Secondary | ICD-10-CM | POA: Diagnosis not present

## 2018-02-23 DIAGNOSIS — E1149 Type 2 diabetes mellitus with other diabetic neurological complication: Secondary | ICD-10-CM | POA: Diagnosis not present

## 2018-02-23 DIAGNOSIS — M79671 Pain in right foot: Secondary | ICD-10-CM | POA: Diagnosis not present

## 2018-02-23 LAB — HEMOGLOBIN A1C
HEMOGLOBIN A1C: 7 % — AB (ref 4.8–5.6)
Mean Plasma Glucose: 154.2 mg/dL

## 2018-02-23 LAB — LIPID PANEL
CHOL/HDL RATIO: 7 ratio
Cholesterol: 309 mg/dL — ABNORMAL HIGH (ref 0–200)
HDL: 44 mg/dL (ref >40)
LDL CALC: 194 mg/dL — AB (ref 0–99)
TRIGLYCERIDES: 357 mg/dL — AB (ref <150)
VLDL: 71 mg/dL — AB (ref 0–40)

## 2018-02-23 LAB — GLUCOSE, CAPILLARY
GLUCOSE-CAPILLARY: 179 mg/dL — AB (ref 65–99)
Glucose-Capillary: 132 mg/dL — ABNORMAL HIGH (ref 65–99)
Glucose-Capillary: 372 mg/dL — ABNORMAL HIGH (ref 65–99)
Glucose-Capillary: 232 mg/dL — ABNORMAL HIGH (ref 65–99)
Glucose-Capillary: 246 mg/dL — ABNORMAL HIGH (ref 65–99)

## 2018-02-23 LAB — MRSA PCR SCREENING: MRSA by PCR: NEGATIVE

## 2018-02-23 MED ORDER — CLOPIDOGREL BISULFATE 75 MG PO TABS
75.0000 mg | ORAL_TABLET | Freq: Every day | ORAL | Status: DC
  Administered 2018-02-23 – 2018-02-26 (×4): 75 mg via ORAL
  Filled 2018-02-23 (×4): qty 1

## 2018-02-23 MED ORDER — RESOURCE THICKENUP CLEAR PO POWD
ORAL | Status: DC | PRN
Start: 2018-02-23 — End: 2018-02-26
  Filled 2018-02-23: qty 125

## 2018-02-23 MED ORDER — STROKE: EARLY STAGES OF RECOVERY BOOK
Freq: Once | Status: AC
  Administered 2018-02-23: 18:00:00

## 2018-02-23 NOTE — Evaluation (Signed)
Occupational Therapy Evaluation Patient Details Name: Charlene Greene MRN: [id number] DOB: 05/13/1922 Today's Date: 02/23/2018    History of Present Illness Pt is a 84 y.o. female with PMH significant for CVA in 2017, DM type 2, HTN, sick sinus syndrome, s/p pacemaker placement, and diastolic CHF. She presented to the ED from assisted living facility with L sided weakness, slurred speech, and facial droop.   Clinical Impression   PTA, pt was at [facility] assisted living where she reports using either RW or w/c for mobility and having assistance for bathing and LB ADL. Pt currently requires mod assist for LB ADL, mod assist for toilet transfers with RW, and min assist for seated UB ADL. She is motivated to participate with OT and improve independence with ADL. Pt presenting with decreased L UE strength and some confusion this session. She would benefit from continued OT services while admitted to improve independence and safety with ADL and functional mobility. Feel pt may need SNF level rehabilitation before returning to ALF depending on level of assistance she is able to receive at [facility]. If they are able to provide necessary assistance, then pt will need home health OT follow-up. Will continue to follow while admitted.    Follow Up Recommendations  SNF;Supervision/Assistance - 24 hour    Equipment Recommendations  None recommended by OT    Recommendations for Other Services       Precautions / Restrictions Precautions Precautions: Fall Precaution Comments: L sided weakness, instability      Mobility Bed Mobility Overal bed mobility: Needs Assistance Bed Mobility: Supine to Sit     Supine to sit: Min assist     General bed mobility comments: Min assist at trunk to come to upright positioning.   Transfers Overall transfer level: Needs assistance Equipment used: Rolling walker (2 wheeled) Transfers: Sit to/from Stand Sit to Stand: Mod assist          General transfer comment: Mod assist overall to power up to fully upright position.     Balance Overall balance assessment: Needs assistance Sitting-balance support: No upper extremity supported;Feet supported Sitting balance-Leahy Scale: Fair     Standing balance support: During functional activity;Bilateral upper extremity supported;Single extremity supported Standing balance-Leahy Scale: Poor Standing balance comment: Relies on UE support and external assistance during toileting hygiene.                            ADL either performed or assessed with clinical judgement   ADL Overall ADL's : Needs assistance/impaired Eating/Feeding: Sitting;Supervision/ safety   Grooming: Sitting;Min guard   Upper Body Bathing: Minimal assistance;Sitting   Lower Body Bathing: Moderate assistance;Sit to/from stand   Upper Body Dressing : Sitting;Minimal assistance   Lower Body Dressing: Moderate assistance;Sit to/from stand   Toilet Transfer: Moderate assistance;Minimal assistance;RW;Ambulation Toilet Transfer Details (indicate cue type and reason): Mod assist to power up to standing and min assist once ambulating.  Toileting- Clothing Manipulation and Hygiene: Moderate assistance;Sit to/from stand Toileting - Clothing Manipulation Details (indicate cue type and reason): Assist for thoroughness     Functional mobility during ADLs: Minimal assistance;Rolling walker;Moderate assistance General ADL Comments: Pt with decreased activity tolerance for ADL and decreased dynamic balance impacting ADL participation. Did note L UE weakness also impacting functionality.      Vision Baseline Vision/History: Wears glasses Wears Glasses: At all times Patient Visual Report: No change from baseline Vision Assessment?: Yes Eye Alignment: Within Functional Limits Ocular Range  of Motion: Within Functional Limits Alignment/Gaze Preference: Within Defined Limits Tracking/Visual Pursuits:  Decreased smoothness of horizontal tracking;Decreased smoothness of vertical tracking(Requiring cues to attend) Convergence: Within functional limits Visual Fields: Other (comment)(appear in tact but difficulty maintaining attention to task) Additional Comments: Pt able to read therapist's badge with glasses on. Assessment limited by pt's ability to attend to directions and task at  hand.      Perception     Praxis      Pertinent Vitals/Pain Pain Assessment: No/denies pain     Hand Dominance Right   Extremity/Trunk Assessment Upper Extremity Assessment Upper Extremity Assessment: LUE deficits/detail LUE Deficits / Details: 3+/5 strength throughout LUE.    Lower Extremity Assessment Lower Extremity Assessment: Defer to PT evaluation       Communication Communication Communication: Expressive difficulties(slightly slurred speech)   Cognition Arousal/Alertness: Awake/alert Behavior During Therapy: WFL for tasks assessed/performed Overall Cognitive Status: No family/caregiver present to determine baseline cognitive functioning Area of Impairment: Orientation;Memory;Following commands;Safety/judgement;Awareness;Problem solving;Attention                 Orientation Level: Disoriented to;Time Current Attention Level: Sustained Memory: Decreased short-term memory Following Commands: Follows one step commands consistently Safety/Judgement: Decreased awareness of safety Awareness: Emergent Problem Solving: Slow processing General Comments: Pt able to follow one-step commands well throughout session. She was slightly confused over the date and demonstrated some decreased short-term memory throughout tasks.    General Comments       Exercises     Shoulder Instructions      Home Living Family/patient expects to be discharged to:: Assisted living                             Home Equipment: Walker - 2 wheels;Wheelchair - manual   Additional Comments: Lives  in assisted living facility with assistance for bathing and dressing per pt report. Makes small snacks for herself but has meals provided.       Prior Functioning/Environment Level of Independence: Needs assistance  Gait / Transfers Assistance Needed: Reports use of RW at times but later reported to OT that she uses w/c in her ALF apartment and to get to dining room. Unsure how much she was ambulating prior to admission.  ADL's / Homemaking Assistance Needed: Reports requiring assistance for LB dressing and bathing tasks.    Comments: Will continue to clarify PLOF with family.         OT Problem List: Decreased strength;Decreased range of motion;Decreased activity tolerance;Impaired balance (sitting and/or standing);Decreased safety awareness;Decreased knowledge of precautions;Decreased knowledge of use of DME or AE;Pain;Impaired UE functional use      OT Treatment/Interventions: Self-care/ADL training;Therapeutic exercise;Energy conservation;DME and/or AE instruction;Therapeutic activities;Patient/family education;Balance training    OT Goals(Current goals can be found in the care plan section) Acute Rehab OT Goals Patient Stated Goal: to get some food OT Goal Formulation: With patient Time For Goal Achievement: 03/09/18 Potential to Achieve Goals: Good ADL Goals Pt Will Perform Grooming: with modified independence;sitting Pt Will Perform Lower Body Dressing: with min assist;sit to/from stand Pt Will Transfer to Toilet: with min guard assist;ambulating;bedside commode Pt Will Perform Toileting - Clothing Manipulation and hygiene: with min guard assist;sit to/from stand Pt/caregiver will Perform Home Exercise Program: Increased strength;Left upper extremity;With written HEP provided;With Supervision  OT Frequency: Min 2X/week   Barriers to D/C:            Co-evaluation  AM-PAC PT "6 Clicks" Daily Activity     Outcome Measure Help from another person eating  meals?: A Little Help from another person taking care of personal grooming?: A Little Help from another person toileting, which includes using toliet, bedpan, or urinal?: A Lot Help from another person bathing (including washing, rinsing, drying)?: A Lot Help from another person to put on and taking off regular upper body clothing?: A Little Help from another person to put on and taking off regular lower body clothing?: A Lot 6 Click Score: 15   End of Session Equipment Utilized During Treatment: Gait belt;Rolling walker Nurse Communication: Mobility status  Activity Tolerance: Patient tolerated treatment well Patient left: in chair;with call bell/phone within reach;with chair alarm set  OT Visit Diagnosis: Unsteadiness on feet (R26.81);Other abnormalities of gait and mobility (R26.89);Other symptoms and signs involving cognitive function                Time: 1610-9604 OT Time Calculation (min): 26 min Charges:  OT General Charges $OT Visit: 1 Visit OT Evaluation $OT Eval Moderate Complexity: 1 Mod OT Treatments $Self Care/Home Management : 8-22 mins G-Codes:     Doristine Section, MS OTR/L  Pager: [phone]   Chad Tiznado A Arnice Vanepps 02/23/2018, 10:19 AM

## 2018-02-23 NOTE — Evaluation (Signed)
Physical Therapy Evaluation Patient Details Name: Charlene Greene MRN: [id number] DOB: Dec 09, 1921 Today's Date: 02/23/2018   History of Present Illness  Pt is a 84 y.o. female with PMH significant for CVA in 2017, DM type 2, HTN, sick sinus syndrome, s/p pacemaker placement, and diastolic CHF. She presented to the ED from assisted living facility with L sided weakness, slurred speech, and facial droop.    Clinical Impression  Pt admitted with above diagnosis. Pt currently with functional limitations due to the deficits listed below (see PT Problem List). PTA pt resided in ALF. She ambulated in her apt with rollator. Staff assisted with ADL and pushed her in a wheelchair to the dining room. On eval, she required min/mod assist bed mobility and mod assist transfers. Unable to progress with ambulation due to LLE weakness. Pt will benefit from skilled PT to increase their independence and safety with mobility to allow discharge to the venue listed below.       Follow Up Recommendations SNF    Equipment Recommendations  Other (comment)(TBD next venue)    Recommendations for Other Services       Precautions / Restrictions Precautions Precautions: Fall;Other (comment) Precaution Comments: L sided weakness, instability      Mobility  Bed Mobility Overal bed mobility: Needs Assistance Bed Mobility: Supine to Sit;Sit to Supine     Supine to sit: Min assist;HOB elevated Sit to supine: Mod assist;HOB elevated   General bed mobility comments: +rail, assist to elevate trunk sup to sit, assit to lower trunk and get BLE into bed sit to sup  Transfers Overall transfer level: Needs assistance Equipment used: Rolling walker (2 wheeled) Transfers: Sit to/from UGI CorporationStand;Stand Pivot Transfers Sit to Stand: Mod assist Stand pivot transfers: Mod assist       General transfer comment: verbal cues for hand placement and sequencing, assist to power up and stabilize balance  Ambulation/Gait             General Gait Details: unable due to LLE weakness  Stairs            Wheelchair Mobility    Modified Rankin (Stroke Patients Only)       Balance Overall balance assessment: Needs assistance Sitting-balance support: No upper extremity supported;Feet supported Sitting balance-Leahy Scale: Fair     Standing balance support: During functional activity;Bilateral upper extremity supported Standing balance-Leahy Scale: Poor Standing balance comment: reliant on BUE support                             Pertinent Vitals/Pain Pain Assessment: No/denies pain    Home Living Family/patient expects to be discharged to:: Assisted living               Home Equipment: Walker - 4 wheels;Wheelchair - manual Additional Comments: Lives in assisted living facility with assistance for bathing and dressing per pt report. Makes small snacks for herself but has meals provided.     Prior Function Level of Independence: Needs assistance   Gait / Transfers Assistance Needed: rollator for ambulation in apartment. Lift chair to assist with transfers. Staff uses w/c to transport to dining room.  ADL's / Homemaking Assistance Needed: Staff assists with dressing and bathing.  Comments: Son and daughter in law provided above information.     Hand Dominance   Dominant Hand: Right    Extremity/Trunk Assessment   Upper Extremity Assessment Upper Extremity Assessment: Defer to OT evaluation LUE Deficits / Details: 3+/5  strength throughout LUE.     Lower Extremity Assessment Lower Extremity Assessment: LLE deficits/detail LLE Deficits / Details: grossly 3+/5, unsure if this is new onset or from previous CVA    Cervical / Trunk Assessment Cervical / Trunk Assessment: Kyphotic  Communication   Communication: Expressive difficulties(slurred speech)  Cognition Arousal/Alertness: Awake/alert Behavior During Therapy: WFL for tasks assessed/performed Overall Cognitive  Status: Impaired/Different from baseline Area of Impairment: Orientation;Memory;Following commands;Safety/judgement;Awareness;Problem solving;Attention                 Orientation Level: Disoriented to;Time Current Attention Level: Sustained Memory: Decreased short-term memory Following Commands: Follows one step commands consistently Safety/Judgement: Decreased awareness of safety;Decreased awareness of deficits Awareness: Emergent Problem Solving: Slow processing;Requires verbal cues General Comments: Pt able to follow one-step commands well throughout session. She was slightly confused over the date and demonstrated some decreased short-term memory throughout tasks.       General Comments      Exercises     Assessment/Plan    PT Assessment Patient needs continued PT services  PT Problem List Decreased strength;Decreased mobility;Decreased safety awareness;Decreased activity tolerance;Decreased balance;Decreased cognition       PT Treatment Interventions DME instruction;Therapeutic activities;Cognitive remediation;Gait training;Therapeutic exercise;Patient/family education;Balance training;Functional mobility training    PT Goals (Current goals can be found in the Care Plan section)  Acute Rehab PT Goals Patient Stated Goal: to walk PT Goal Formulation: With patient/family Time For Goal Achievement: 03/09/18 Potential to Achieve Goals: Good    Frequency Min 3X/week   Barriers to discharge        Co-evaluation               AM-PAC PT "6 Clicks" Daily Activity  Outcome Measure Difficulty turning over in bed (including adjusting bedclothes, sheets and blankets)?: A Lot Difficulty moving from lying on back to sitting on the side of the bed? : Unable Difficulty sitting down on and standing up from a chair with arms (e.g., wheelchair, bedside commode, etc,.)?: Unable Help needed moving to and from a bed to chair (including a wheelchair)?: A Little Help needed  walking in hospital room?: A Lot Help needed climbing 3-5 steps with a railing? : Total 6 Click Score: 10    End of Session Equipment Utilized During Treatment: Gait belt Activity Tolerance: Patient tolerated treatment well Patient left: in bed;with call bell/phone within reach;with bed alarm set;with family/visitor present Nurse Communication: Mobility status PT Visit Diagnosis: Other abnormalities of gait and mobility (R26.89);Difficulty in walking, not elsewhere classified (R26.2);Muscle weakness (generalized) (M62.81)    Time: 1914-7829 PT Time Calculation (min) (ACUTE ONLY): 17 min   Charges:   PT Evaluation $PT Eval Moderate Complexity: 1 Mod     PT G Codes:        Aida Raider, PT  Office # [phone] Pager [phone]   Ilda Foil 02/23/2018, 1:27 PM

## 2018-02-23 NOTE — Progress Notes (Signed)
OT Cancellation Note  Patient Details Name: Charlene Greene MRN: [id number] DOB: 1922-03-22   Cancelled Treatment:    Reason Eval/Treat Not Completed: Fatigue/lethargy limiting ability to participate. Pt pleasant this am but unable to maintain awake/alert state longer than a few seconds to participate with OT. Will check back as able.   Doristine Sectionharity A Cherri Yera, MS OTR/L  Pager: [phone]   Shanese Riemenschneider A Aivah Putman 02/23/2018, 7:28 AM

## 2018-02-23 NOTE — Consult Note (Addendum)
Referring Physician: Rhetta MuraSamtani, Jai-Gurmukh, MD    Chief Complaint: Altered Mental Status  HPI: Charlene Greene is a 84 y.o. female with a history of CVA in 2017 with residual left-sided weakness, DM 2, HTN, sick sinus syndrome, s/p pacemaker, diastolic CHF who presented to the ED with complaints of left-sided weakness, slurred speech and left facial droop.  Per family, she had symptoms presenting the same way as a stroke she had in November 2017 which left her with residual left upper and lower extremity weakness and left leg foot drop. At baseline she is a walker and wheelchair. Per family on Wednesday patient was not feeling well; they noted she had increased weakness on her left side but symptoms improved. By Friday night she then had left-sided weakness, slurred speech with some word finding difficulties. Likely woke up with the symptoms at night.  On Saturday morning they noticed symptoms still persisted, with alternating left and right sided facial droop and by Saturday night family decided to bring the patient to the ER for evaluation. CT of the head did not show any acute intracranial abnormality.   Neuro was consulted for evaluation.  On assessment of patient she is awake, alert and oriented except for to date, month and year; per family at baseline patient has  memory loss issues.  Facial droop, slurred speech and word finding difficulties have since resolved.  However, the patient continues to have persistent left-sided weakness, likely worsened from her previous baseline.  Of note, patient has had a complaint of a cough with mild sputum production, but denies fever, chills, nausea or vomiting, recent falls, trauma, headaches or dizziness.  LSN: 02/21/18 tPA Given: No: Outside of TPA window Premorbid modified Rankin scale (mRS): 2  Past Medical History:  Diagnosis Date  . Diabetes mellitus without complication (HCC)   . Hypertension   . Pacemaker   . Sick sinus syndrome (HCC)   . Stroke  [facility](HCC)    2 previous cva's    Past Surgical History:  Procedure Laterality Date  . PACEMAKER PLACEMENT      Family History  Problem Relation Age of Onset  . Lung cancer Sister   . Hypertension Other   . CAD Neg Hx   . Stroke Neg Hx    Social History:  reports that she has never smoked. She has never used smokeless tobacco. She reports that she does not drink alcohol or use drugs.  Allergies:  Allergies  Allergen Reactions  . Statins Other (See Comments)    Intol to numerous statins still feels has some muscle weakness, myalgia  . Other Other (See Comments)    Unknown reaction to dye ([facility] 02[facility])  . Amlodipine Other (See Comments)    unknown  . Celecoxib Nausea Only and Other (See Comments)  . Codeine Other (See Comments)    unknown  . Naproxen Other (See Comments)    unknown  . Piroxicam Other (See Comments)    unknown    Medications:  .  stroke: mapping our early stages of recovery book   Does not apply Once  . amLODipine  5 mg Oral BID  . clopidogrel  75 mg Oral Daily  . insulin aspart  0-5 Units Subcutaneous QHS  . insulin aspart  0-9 Units Subcutaneous TID WC  . metoprolol succinate  100 mg Oral Daily    ROS: 10 point systems reviewed with patient and are negative except for above  in HPI  Physical Examination: Blood pressure (!) 154/70, pulse  68, temperature 98 F (36.7 C), temperature source Oral, resp. rate 20, height 5\' 5"  (1.651 m), weight 75.9 kg (167 lb 5.3 oz), SpO2 98 %. HEENT-  Normocephalic, no lesions, without obvious abnormality.  Normal external eye and conjunctiva.   Cardiovascular- S1-S2 audible, pulses palpable throughout, pacemaker left chest Lungs-no rhonchi or wheezing noted, no excessive working breathing.  Saturations within normal limits Abdomen- All 4 quadrants palpated and nontender Musculoskeletal-left lower extremity joint tenderness, left foot drop no edema noted Skin-patient with multiple ecchymotic areas of the face  and arms  Neurological Examination Mental Status: Alert, oriented to self, place and situation, knows where she and how long she has left there, but disoriented to month and year.  Speech fluent without evidence of aphasia.  Able to follow 3 step commands without difficulty.  Patient with good recall and repetition. Cranial Nerves: II: Visual fields grossly normal III,IV, VI: ptosis not present, extra-ocular motions intact bilaterally pupils equal, round, reactive to light and accommodation V,VII: smile symmetric, facial light touch sensation normal bilaterally VIII: Hearing intact to voice IX,X: uvula rises symmetrically XI: bilateral shoulder shrug XII: midline tongue extension Motor: Left foot drop with left sided weakness at baseline Right : Upper extremity   4/5    Left:     Upper extremity   3/5  Lower extremity   4/5     Lower extremity   3/5 Tone and bulk:normal tone throughout; no atrophy noted Sensory: Pinprick and light touch intact throughout, bilaterally Deep Tendon Reflexes: 2+ and symmetric throughout Plantars: Right: downgoing   Left: downgoing Cerebellar: normal finger-to-nose and normal heel-to-shin test, normal orbiting on the left Gait: Not assessed  NIHSS 1a Level of Conscious.: 0 1b LOC Questions: 1 1c LOC Commands: 0 2 Best Gaze: 0 3 Visual: 0 4 Facial Palsy: 0 5a Motor Arm - left: 0 5b Motor Arm - Right: 0 6a Motor Leg - Left: 0 6b Motor Leg - Right: 0 7 Limb Ataxia: 0 8 Sensory: 0 9 Best Language: 0 10 Dysarthria: 0 11 Extinct. and Inatten.: 0 TOTAL: 1   Ct Head Wo Contrast 02/22/2018 IMPRESSION:  1.  No acute intracranial abnormality.   Assessment: 84 y.o. 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 Stroke symptoms in a patient with a previous history of right basal ganglia infarct secondary to small vessel disease in November 2017. CT of the head done this admission did not show any acute abnormalities.  Patient appears to have had stroke symptoms while on monotherapy with aspirin..  We will recommend adding Plavix to ASA for dual therapy.  Benefits of secondary stroke prevention with dual therapy outweigh the risk of bleeding.  We will pursue a full stroke workup with CTA head and neck as patient is unable to undergo MRI of the brain due to pacemaker. Also will obtain an echocardiogram to evaluate for possible embolic source.  We will continue the patient on her home regimen of Zetia and defer statin at this time due to advanced age. We will continue with rehab therapy, physical therapy and speech therapy - physical therapy recommends skilled nursing facility for the patient. Stroke Risk Factors - family history and hypertension, diabetes mellitus, dyslipidemia 2.  History of sick sinus syndrome status  post pacemaker  Plan: - HgbA1c, fasting lipid panel - CTA head and neck pending - PT consult, OT consult, Speech consult - Echocardiogram - Prophylactic therapy-dual antiplatelet with aspirin and Plavix - High dose Statin deferred, patient currently on Zetia - Risk factor modification - Telemetry monitoring - Frequent neuro checks   Noralee Space DNP Neuro-hospitalist Team [phone] 02/23/2018, 4:24 PM  I have seen and examined the patient. Assessment and plan discussed with Neurology team Electronically signed: Dr. Caryl Pina

## 2018-02-23 NOTE — Evaluation (Signed)
Clinical/Bedside Swallow Evaluation Patient Details  Name: Charlene Greene MRN: [id number] Date of Birth: [birth year]  Today's Date: 02/23/2018 Time: SLP Start Time (ACUTE ONLY): 0806 SLP Stop Time (ACUTE ONLY): 0823 SLP Time Calculation (min) (ACUTE ONLY): 17 min  Past Medical History:  Past Medical History:  Diagnosis Date  . Diabetes mellitus without complication (HCC)   . Hypertension   . Pacemaker   . Stroke [facility](HCC)    2 previous cva's   Past Surgical History:  Past Surgical History:  Procedure Laterality Date  . PACEMAKER PLACEMENT     HPI:  Charlene Greene is a 84 y.o. female with medical history significant of CVA 2017, DM 2, HTN, sick sinus syndrome, sp pacemaker, diastolic CHF presented with left side weakness, slurred speech and facial droop similar to prior presentation of CVA in 2107. Per chart pt reports 1 week of cough sometimes worse with eating. Patient had dysphasia with prior CVA but it resolved and she is on regular diet. CXR No edema or consolidation. Head CT No acute intracranial abnormality. BSE 09/2016 mild oropharyngeal dysphagia, reg/thin recommended.   Assessment / Plan / Recommendation Clinical Impression  Immediate cough after first sip during 3 oz water test and with subsequent trials. Nectar thick liquid decreased cough but did not completely prevent delayed coughs (may have been due to prior thin trials). Pt says she has been coughing for a week; no s/s aspiration noted prior to po's . Functional mastication of solid. Recommend initiate Dys 3, nectar thick liquid, no straws, pills whole in applesauce. ST to follow and may need objective assessment if s/s aspiration persist.    SLP Visit Diagnosis: Dysphagia, pharyngeal phase (R13.13)    Aspiration Risk  Moderate aspiration risk    Diet Recommendation Dysphagia 3 (Mech soft);Nectar-thick liquid   Liquid Administration via: Cup;No straw Medication Administration: Whole meds with puree Supervision: Patient  able to self feed Compensations: Slow rate;Small sips/bites Postural Changes: Seated upright at 90 degrees    Other  Recommendations Oral Care Recommendations: Oral care BID   Follow up Recommendations Other (comment)(TBD)      Frequency and Duration min 2x/week  2 weeks       Prognosis Prognosis for Safe Diet Advancement: Good      Swallow Study   General HPI: Charlene Greene is a 84 y.o. female with medical history significant of CVA 2017, DM 2, HTN, sick sinus syndrome, sp pacemaker, diastolic CHF presented with left side weakness, slurred speech and facial droop similar to prior presentation of CVA in 2107. Per chart pt reports 1 week of cough sometimes worse with eating. Patient had dysphasia with prior CVA but it resolved and she is on regular diet. CXR No edema or consolidation. Head CT No acute intracranial abnormality. BSE 09/2016 mild oropharyngeal dysphagia, reg/thin recommended. Type of Study: Bedside Swallow Evaluation Previous Swallow Assessment: (see hpi) Diet Prior to this Study: NPO Temperature Spikes Noted: No Respiratory Status: Room air History of Recent Intubation: No Behavior/Cognition: Alert;Cooperative;Pleasant mood Oral Cavity Assessment: Within Functional Limits Oral Care Completed by SLP: No Oral Cavity - Dentition: Adequate natural dentition Vision: Functional for self-feeding Self-Feeding Abilities: Able to feed self Patient Positioning: Upright in bed Baseline Vocal Quality: Normal Volitional Cough: Strong Volitional Swallow: Able to elicit    Oral/Motor/Sensory Function Overall Oral Motor/Sensory Function: Within functional limits   Ice Chips Ice chips: Not tested   Thin Liquid Thin Liquid: Impaired Presentation: Cup Oral Phase Impairments: (none) Oral Phase Functional Implications: (none) Pharyngeal  Phase  Impairments: Cough - Immediate    Nectar Thick Nectar Thick Liquid: Impaired Presentation: Cup Pharyngeal Phase Impairments: Cough -  Delayed   Honey Thick Honey Thick Liquid: Not tested   Puree Puree: Within functional limits   Solid   GO   Solid: Within functional limits        Royce Macadamia 02/23/2018,8:32 AM  Breck Coons Lonell Face.Ed [employer] [phone]

## 2018-02-23 NOTE — Progress Notes (Signed)
MEDICATION RELATED CONSULT NOTE - INITIAL   Pharmacy Consult for medication reconciliation   Allergies  Allergen Reactions  . Statins Other (See Comments)    Intol to numerous statins still feels has some muscle weakness, myalgia  . Other Other (See Comments)    Unknown reaction to dye (listed on [facility] 02/22/18)  . Amlodipine Other (See Comments)    unknown  . Celecoxib Nausea Only and Other (See Comments)  . Codeine Other (See Comments)    unknown  . Naproxen Other (See Comments)    unknown  . Piroxicam Other (See Comments)    unknown    Patient Measurements: Height: 5\' 5"  (165.1 cm) Weight: 167 lb 5.3 oz (75.9 kg) IBW/kg (Calculated) : 57  Vital Signs: Temp: 97.7 F (36.5 C) (04/14 0843) Temp Source: Oral (04/14 0843) BP: 166/66 (04/14 0843) Pulse Rate: 66 (04/14 0843)  Medical History: Past Medical History:  Diagnosis Date  . Diabetes mellitus without complication (HCC)   . Hypertension   . Pacemaker   . Stroke [facility])    2 previous cva's    Medications:  Medications Prior to Admission  Medication Sig Dispense Refill Last Dose  . acetaminophen (TYLENOL) 500 MG tablet Take 500 mg by mouth 2 (two) times daily.   02/22/2018 at 800  . amLODipine (NORVASC) 5 MG tablet Take 5 mg by mouth 2 (two) times daily.   02/22/2018 at 800  . aspirin 325 MG tablet Take 325 mg by mouth daily.   02/22/2018 at 800  . busPIRone (BUSPAR) 5 MG tablet Take 1 tablet (5 mg total) by mouth 2 (two) times daily. 60 tablet 0 02/22/2018 at 800  . cholecalciferol (VITAMIN D) 1000 units tablet Take 1,000 Units by mouth daily.   02/22/2018 at 800  . fenofibrate 160 MG tablet Take 1 tablet (160 mg total) by mouth daily. 30 tablet 0 02/22/2018 at 800  . furosemide (LASIX) 20 MG tablet Take 10 mg by mouth daily.   02/22/2018 at 800  . gabapentin (NEURONTIN) 100 MG capsule Take 200 mg by mouth at bedtime.   02/21/2018 at 2000  . Hexylresorcinol (THROAT LOZENGES MT) Use as directed 1 lozenge in the mouth or  throat every 6 (six) hours as needed (sore throat (max 4 tabs/24 hours)).   unknown  . insulin aspart (NOVOLOG) 100 UNIT/ML injection Inject 2-12 Units into the skin 4 (four) times daily as needed for high blood sugar (CBG >200). Per sliding scale: CBG 201-250 2 units, 251-300 4 units, 301-350 6 units, 351-400 8 units, 401-450 10 units, 451-500 12 units, >500 call 911   02/20/2018 at 1630  . isosorbide mononitrate (IMDUR) 60 MG 24 hr tablet Take 1 tablet (60 mg total) by mouth daily. 30 tablet 1 02/22/2018 at 800  . lisinopril (PRINIVIL,ZESTRIL) 40 MG tablet Take 1 tablet (40 mg total) by mouth 2 (two) times daily. (Patient taking differently: Take 40 mg by mouth daily. ) 60 tablet 1 02/22/2018 at 800  . Magnesium 250 MG TABS Take 250 mg by mouth at bedtime.   02/21/2018 at 2000  . metoprolol succinate (TOPROL-XL) 100 MG 24 hr tablet Take 1 tablet (100 mg total) by mouth daily. 30 tablet 1 02/22/2018 at 800  . nitroGLYCERIN (NITROSTAT) 0.4 MG SL tablet Place 0.4 mg under the tongue every 5 (five) minutes as needed for chest pain (max 3 doses in 15 minutes).   unknown  . psyllium (REGULOID) 0.52 g capsule Take 0.52 g by mouth at bedtime.  02/21/2018 at 2000  . ranolazine (RANEXA) 500 MG 12 hr tablet Take 1 tablet (500 mg total) by mouth 2 (two) times daily. 60 tablet 1 02/22/2018 at 800  . senna (SENOKOT) 8.6 MG TABS tablet Take 1 tablet by mouth daily as needed (constipation).   unknown   Scheduled:  . amLODipine  5 mg Oral BID  . insulin aspart  0-5 Units Subcutaneous QHS  . insulin aspart  0-9 Units Subcutaneous TID WC  . metoprolol succinate  100 mg Oral Daily   PRN: acetaminophen **OR** acetaminophen (TYLENOL) oral liquid 160 mg/5 mL **OR** acetaminophen, albuterol, senna-docusate  Assessment: Per medication reconciliation with nursing home medication administration guide, the above list is accurate. Of note, the following medications have NOT been re-ordered this admission:  Aspirin 325 mg  daily Buspar 5 mg BID Fenofibrate 160 mg daily Furosemide 10 mg daily Gabapentin 200 mg HS Isosorbide 60 mg daily Lisinopril 40 mg daily Ranexa 500 mg BID Vitamin D 1000 IU daily  The patient is no longer taking the following: Plavix, zetia, amaryl, hydral, niacin, lyrica, Kcl  Recommendations:  Restart PTA medications as necessary   Al CorpusLindsey Stratton Villwock, PharmD PGY1 Pharmacy Resident Phone: [phone] After 3:30PM please call Main Pharmacy [phone] 02/23/2018,9:56 AM

## 2018-02-23 NOTE — Progress Notes (Signed)
Patient thinks she had a "bad reaction" -retained fluid with plavix??

## 2018-02-23 NOTE — Consult Note (Signed)
ELECTROPHYSIOLOGY CONSULT NOTE    Patient ID: Charlene Greene MRN: [id number], DOB/AGE: 82-20-1923 82 y.o.  Admit date: 02/22/2018 Date of Consult: 02/23/2018  Primary Physician: Almetta Lovely, [facility] Primary Cardiologist: Asif Wahid [facility]- [facility]re Kitty)  Patient Profile: Charlene Greene is a 84 y.o. female with a history of sick sinus syndrome s/p PPM (MDT) who is being seen today for the evaluation of Dr Mahala Menghini at the request of PVCs.  HPI:  Charlene Greene is a 84 y.o. female admitted for symptoms concerning for TIA.  She initially presented with L sided weakness, facial droop, and slurred speech.  She feels that she is much better, though her son feels that she continues to have minor slurred speech.  She has cough.    She denies chest pain, palpitations, dyspnea, PND, orthopnea, nausea, vomiting, dizziness, syncope.  Past Medical History:  Diagnosis Date  . CAD (coronary artery disease)   . Diabetes mellitus without complication (HCC)   . Hyperlipidemia   . Hypertension   . Pacemaker   . Renal artery stenosis (HCC)    s/p bilateral stents  . Sick sinus syndrome (HCC)   . Stroke [facility])    2 previous cva's     Surgical History:  Past Surgical History:  Procedure Laterality Date  . PACEMAKER PLACEMENT  05/15/2014   MDT Advisa MRI implanted at [facility] for sick sinus syndrome     Medications Prior to Admission  Medication Sig Dispense Refill Last Dose  . acetaminophen (TYLENOL) 500 MG tablet Take 500 mg by mouth 2 (two) times daily.   02/22/2018 at 800  . amLODipine (NORVASC) 5 MG tablet Take 5 mg by mouth 2 (two) times daily.   02/22/2018 at 800  . aspirin 325 MG tablet Take 325 mg by mouth daily.   02/22/2018 at 800  . busPIRone (BUSPAR) 5 MG tablet Take 1 tablet (5 mg total) by mouth 2 (two) times daily. 60 tablet 0 02/22/2018 at 800  . cholecalciferol (VITAMIN D) 1000 units tablet Take 1,000 Units by mouth daily.   02/22/2018 at 800  . fenofibrate 160 MG  tablet Take 1 tablet (160 mg total) by mouth daily. 30 tablet 0 02/22/2018 at 800  . furosemide (LASIX) 20 MG tablet Take 10 mg by mouth daily.   02/22/2018 at 800  . gabapentin (NEURONTIN) 100 MG capsule Take 200 mg by mouth at bedtime.   02/21/2018 at 2000  . Hexylresorcinol (THROAT LOZENGES MT) Use as directed 1 lozenge in the mouth or throat every 6 (six) hours as needed (sore throat (max 4 tabs/24 hours)).   unknown  . insulin aspart (NOVOLOG) 100 UNIT/ML injection Inject 2-12 Units into the skin 4 (four) times daily as needed for high blood sugar (CBG >200). Per sliding scale: CBG 201-250 2 units, 251-300 4 units, 301-350 6 units, 351-400 8 units, 401-450 10 units, 451-500 12 units, >500 call 911   02/20/2018 at 1630  . isosorbide mononitrate (IMDUR) 60 MG 24 hr tablet Take 1 tablet (60 mg total) by mouth daily. 30 tablet 1 02/22/2018 at 800  . lisinopril (PRINIVIL,ZESTRIL) 40 MG tablet Take 1 tablet (40 mg total) by mouth 2 (two) times daily. (Patient taking differently: Take 40 mg by mouth daily. ) 60 tablet 1 02/22/2018 at 800  . Magnesium 250 MG TABS Take 250 mg by mouth at bedtime.   02/21/2018 at 2000  . metoprolol succinate (TOPROL-XL) 100 MG 24 hr tablet Take 1 tablet (100 mg total) by mouth daily. 30  tablet 1 02/22/2018 at 800  . nitroGLYCERIN (NITROSTAT) 0.4 MG SL tablet Place 0.4 mg under the tongue every 5 (five) minutes as needed for chest pain (max 3 doses in 15 minutes).   unknown  . psyllium (REGULOID) 0.52 g capsule Take 0.52 g by mouth at bedtime.   02/21/2018 at 2000  . ranolazine (RANEXA) 500 MG 12 hr tablet Take 1 tablet (500 mg total) by mouth 2 (two) times daily. 60 tablet 1 02/22/2018 at 800  . senna (SENOKOT) 8.6 MG TABS tablet Take 1 tablet by mouth daily as needed (constipation).   unknown  . amLODipine (NORVASC) 10 MG tablet Take 1 tablet (10 mg total) by mouth 2 (two) times daily. (Patient not taking: Reported on 02/22/2018) 60 tablet 1 Not Taking at Unknown time  . clopidogrel  (PLAVIX) 75 MG tablet Take 1 tablet (75 mg total) by mouth daily. (Patient not taking: Reported on 02/22/2018) 30 tablet 1 Not Taking at Unknown time  . ezetimibe (ZETIA) 10 MG tablet Take 1 tablet (10 mg total) by mouth daily. (Patient not taking: Reported on 02/22/2018) 30 tablet 1 Not Taking at Unknown time  . furosemide (LASIX) 40 MG tablet Take 1 tablet (40 mg total) by mouth 2 (two) times daily. (Patient not taking: Reported on 02/22/2018) 60 tablet 1 Not Taking at Unknown time  . glimepiride (AMARYL) 2 MG tablet Take 1 tablet (2 mg total) by mouth 2 (two) times daily with a meal. (Patient not taking: Reported on 02/22/2018) 60 tablet 1 Not Taking at Unknown time  . hydrALAZINE (APRESOLINE) 25 MG tablet Take 3 tablets (75 mg total) by mouth every 8 (eight) hours. (Patient not taking: Reported on 02/22/2018) 90 tablet 0 Not Taking at Unknown time  . Multiple Vitamins-Minerals (PRESERVISION AREDS 2) CAPS Take 1 capsule by mouth 2 (two) times daily. (Patient not taking: Reported on 02/22/2018) 60 capsule 0 Not Taking at Unknown time  . niacin 250 MG tablet Take 1 tablet (250 mg total) by mouth every morning. (Patient not taking: Reported on 02/22/2018) 30 tablet 1 Not Taking at Unknown time  . potassium chloride SA (K-DUR,KLOR-CON) 20 MEQ tablet Take 1 tablet (20 mEq total) by mouth 2 (two) times daily. (Patient not taking: Reported on 02/22/2018) 60 tablet 1 Not Taking at Unknown time  . pregabalin (LYRICA) 75 MG capsule Take 1 capsule (75 mg total) by mouth 2 (two) times daily. (Patient not taking: Reported on 02/22/2018) 60 capsule 1 Not Taking at Unknown time  . senna-docusate (SENOKOT-S) 8.6-50 MG tablet Take 2 tablets by mouth 2 (two) times daily. (Patient not taking: Reported on 02/22/2018)   Not Taking at Unknown time    Inpatient Medications:  .  stroke: mapping our early stages of recovery book   Does not apply Once  . amLODipine  5 mg Oral BID  . clopidogrel  75 mg Oral Daily  . insulin aspart   0-5 Units Subcutaneous QHS  . insulin aspart  0-9 Units Subcutaneous TID WC  . metoprolol succinate  100 mg Oral Daily    Allergies:  Allergies  Allergen Reactions  . Statins Other (See Comments)    Intol to numerous statins still feels has some muscle weakness, myalgia  . Other Other (See Comments)    [facility] reaction [facility])  . Amlodipine Other (See Comments)    unknown  . Celecoxib Nausea Only and Other (See Comments)  . Codeine Other (See Comments)    unknown  .  Naproxen Other (See Comments)    unknown  . Piroxicam Other (See Comments)    unknown    Social History   Socioeconomic History  . Marital status: Widowed    Spouse name: Not on file  . Number of children: Not on file  . Years of education: Not on file  . Highest education level: Not on file  Occupational History  . Not on file  Social Needs  . Financial resource strain: Not on file  . Food insecurity:    Worry: Not on file    Inability: Not on file  . Transportation needs:    Medical: Not on file    Non-medical: Not on file  Tobacco Use  . Smoking status: Never Smoker  . Smokeless tobacco: Never Used  Substance and Sexual Activity  . Alcohol use: No  . Drug use: Never  . Sexual activity: Not on file  Lifestyle  . Physical activity:    Days per week: Not on file    Minutes per session: Not on file  . Stress: Not on file  Relationships  . Social connections:    Talks on phone: Not on file    Gets together: Not on file    Attends religious service: Not on file    Active member of club or organization: Not on file    Attends meetings of clubs or organizations: Not on file    Relationship status: Not on file  . Intimate partner violence:    Fear of current or ex partner: Not on file    Emotionally abused: Not on file    Physically abused: Not on file    Forced sexual activity: Not on file  Other Topics Concern  . Not on file  Social History Narrative  . Not on file      Family History  Problem Relation Age of Onset  . Lung cancer Sister   . Hypertension Other   . CAD Neg Hx   . Stroke Neg Hx      Review of Systems: All other systems reviewed and are otherwise negative except as noted above.  Physical Exam: Vitals:   02/23/18 0140 02/23/18 0411 02/23/18 0843 02/23/18 1215  BP: (!) 179/76 (!) 185/95 (!) 166/66 (!) 154/70  Pulse:  (!) 59 66 68  Resp: 17 18 20 20   Temp: 97.8 F (36.6 C) 97.7 F (36.5 C) 97.7 F (36.5 C) 98 F (36.7 C)  TempSrc: Oral Oral Oral Oral  SpO2: 98% 97% 96% 98%  Weight:      Height:        GEN- The patient is elderly appearing, alert and oriented x 3 today.   HEENT: normocephalic, atraumatic; sclera clear, conjunctiva pink; hearing intact; oropharynx clear; neck supple Lungs- coarse BS which improve with cough, normal work of breathing.  Heart- Regular rate and rhythm,  GI- soft, non-tender, non-distended, bowel sounds present Extremities- no clubbing, cyanosis, + dependant edema;  MS- no significant deformity or atrophy Skin- warm and dry, no rash or lesion Psych- euthymic mood, full affect  Labs:   Lab Results  Component Value Date   WBC 8.0 02/22/2018   HGB 13.6 02/22/2018   HCT 40.0 02/22/2018   MCV 88.5 02/22/2018   PLT 272 02/22/2018    Recent Labs  Lab 02/22/18 1318 02/22/18 1355  NA 136 138  K 4.2 4.3  CL 99* 100*  CO2 27  --   BUN 24* 27*  CREATININE 1.11* 1.20*  CALCIUM  9.1  --   PROT 6.1*  --   BILITOT 0.7  --   ALKPHOS 50  --   ALT 19  --   AST 23  --   GLUCOSE 176* 173*      Radiology/Studies: Dg Chest 2 View  Result Date: 02/22/2018 CLINICAL DATA:  Left-sided weakness and slurred speech EXAM: CHEST - 2 VIEW COMPARISON:  September 28, 2016 FINDINGS: There is no edema or consolidation. Heart size and pulmonary vascularity are normal. Pacemaker leads are attached to the right atrium and right ventricle. There is aortic atherosclerosis. No adenopathy. There is degenerative  change in both shoulders. Remodeling of the left humeral head is indicative of chronic avascular necrosis. IMPRESSION: Pacemaker leads attached right atrium and right ventricle. Heart size normal. There is aortic atherosclerosis. No edema or consolidation. There is avascular necrosis in the left humeral head. Aortic Atherosclerosis (ICD10-I70.0). Electronically Signed   By: Bretta Bang III M.D.   On: 02/22/2018 16:02   Ct Head Wo Contrast  Result Date: 02/22/2018 CLINICAL DATA:  Slurred speech with left-sided facial droop and weakness. EXAM: CT HEAD WITHOUT CONTRAST TECHNIQUE: Contiguous axial images were obtained from the base of the skull through the vertex without intravenous contrast. COMPARISON:  CTA head and neck dated September 24, 2016. FINDINGS: Brain: No evidence of acute infarction, hemorrhage, hydrocephalus, extra-axial collection or mass lesion/mass effect. Old lacunar infarct in the right basal ganglia. Stable cerebral atrophy and chronic microvascular ischemic changes. Vascular: Calcified atherosclerosis at the skullbase. No hyperdense vessel. Skull: Negative for fracture or focal lesion. Sinuses/Orbits: No acute finding. Other: None. IMPRESSION: 1.  No acute intracranial abnormality. Electronically Signed   By: Obie Dredge M.D.   On: 02/22/2018 14:14    NWG:NFAOZH paced rhythm, LAD (personally reviewed)  TELEMETRY: atrial paced rhythm with frequent PVCs (personally reviewed)  DEVICE INTERROGATION: Carelink link express transmission from ED this admission is reviewed and length and reveals that she has a [employer] Advisa MRI dual chamber PPM with MDT 5076 MRI conditional leads.  The device is programmed AAIR->DDDR 60-120 bpm.  Battery status is good (voltage 3.01V).  Atrial and RV threshold, sensing, and impedance are normal. No atrial fibrillation detected to correspond with this admission.  No arrhythmias since last interrogation 10/14/2017.   Normal device  function  Assessment/Plan:  1.  Sick sinus syndrome Normal pacemaker function See above Of note, she has an MRI conditional pacemaker and therefore could have an MRI if this becomes necessary.  2. PVCs Asymptomatic No further workup planned  3. ? TIA No afib on PPM interrogation.  4. CAD No ischemic symptoms No further workup  Cardiology to see as needed while here Please call with questions  Randolm Idol 02/23/2018 4:26 PM

## 2018-02-23 NOTE — Progress Notes (Signed)
[facility] . Alert and oriented x 4 moves all extremities. C/o slurred speech facial droop and left side weakness .which as resolved  In the ed.Pt still has some  slurred speech but  No facial drroop. Denied pain but c/o dry cough for about 1 week now. NPO r/t hx of dysphagia, oriented to unit and use of call bell   Care plan reviewed and safety  Measured explained and pt verbalized good understanding. On tele with pace rhythm... RN will continue to monitor.

## 2018-02-23 NOTE — Progress Notes (Signed)
Hospitalist progress note   Charlene Greene  ZOX:[id number] DOB: 1922/09/30 DOA: 02/22/2018 PCP: [facility]  Specialists:  Dr. Lorelei PontAsif Wahid of cardiology [place]   Brief Narrative:  [age] female right basal ganglia infarct, SSS + PPM [place], HLD, DM TY 2+ neuropathy, hyponatremia, bipolar, HTN, reconstructive left breast surgery in the past?  Breast cancer, CAD status post CABG 2001--NST MI 04/2014-at that visit was discussed with patient to take Plavix but because of her anemia this was reconsidered and cardiology recommended medical management it also was felt at that time she would not tolerate a statin S/B extensive rehab stay 09/26/2016-10/11/2016  [facility]-left-sided weakness, droop, slurred speech +1/52 H/o cough, no fever no chills + dysphagia?--Supposed to be on dysphagia diet previously  Assessment & Plan:   Likely TIA-CT scan head no specific symptoms cannot get MRI-we will get CTA and ask neurology to see --last noted to be on aspirin 325 only pharmacist reviewed meds and is not on aspirin anymore either-likely etiology is underlying A. fib-pacemaker to be interrogated On anticoagulation will have to be balanced with her risk of increasing falls recently  Bilateral hip pain-getting x-rays hips to ensure no occult fracture  Supposed to be on dysphagia diet but eating regular apparently? Will discuss with family Chest x-ray?  Dysphagia shows no pneumonia but avascular necrosis of left humeral head  Seborrheic keratosis + multiple dark lesions over back-we will need outpatient dermatology referral for possible biopsy  AVN left humerus-occupational therapy to see evaluate and help with management  Stage I CKD BUN/creatinine 27/1.2-monitor.  Prior to admission meds include Lasix 10 daily, lisinopril 40 twice daily which has been held--- [facility]   HTN permissive hypertension-slow gradual lowering of the  same 5-14 days-?  Amlodipine,?  Metoprolol  Diabetes mellitus type 2 complication neuropathy/nephropathy-get A1c TSH-again will ask for clarification of meds by pharmacist and once done will resume pregabalin, hold oral hypoglycemic agent etc. Etc.  HLD-?  Fenofibrate 160,?  SCD, inpatient pending workup,   Consultants:   None yet  Procedures:   CT scan  Antimicrobials:   None  Subjective: Alert oriented slightly hard of hearing slow speech however oriented No chest pain No fever No overt deficit Has had chronic pain and upper arm and has been unable to move it much for the past year due to she does not recall specifically why she came to the hospital but knows where she is and asked that I discussed her care with her son No dark stool no tarry stool reported  Objective: Vitals:   02/22/18 2015 02/22/18 2148 02/22/18 2333 02/23/18 0140  BP: (!) 157/75 (!) 171/76 (!) 175/76 (!) 179/76  Pulse:  60 64   Resp: 20 20 16 17   Temp:  97.8 F (36.6 C)  97.8 F (36.6 C)  TempSrc:  Oral  Oral  SpO2:  99% 97% 98%  Weight:  75.9 kg (167 lb 5.3 oz)    Height:  5\' 5"  (1.651 m)      Intake/Output Summary (Last 24 hours) at 02/23/2018 0753 Last data filed at 02/23/2018 0435 Gross per 24 hour  Intake 252.5 ml  Output -  Net 252.5 ml   Filed Weights   02/22/18 1316 02/22/18 2148  Weight: 82.1 kg (181 lb) 75.9 kg (167 lb 5.3 oz)    Examination: Patient oriented slow speech no focal deficit-smile symmetric Moves upper extremities without deficit S1-S2 no murmur or gallop Chest  is clear with no added sounds abdomen soft nontender no rebound no guarding Mild lower extremity edema bilaterally Multiple areas of dark hyperpigmented plaques over back 1 or 2 over face >5 mm diameter somewhat hyperpigmented   Data Reviewed: I have personally reviewed following labs and imaging studies  CBC: Recent Labs  Lab 02/22/18 1318 02/22/18 1355  WBC 8.0  --   NEUTROABS 5.5  --   HGB  13.5 13.6  HCT 40.6 40.0  MCV 88.5  --   PLT 272  --    Basic Metabolic Panel: Recent Labs  Lab 02/22/18 1318 02/22/18 1355  NA 136 138  K 4.2 4.3  CL 99* 100*  CO2 27  --   GLUCOSE 176* 173*  BUN 24* 27*  CREATININE 1.11* 1.20*  CALCIUM 9.1  --    GFR: Estimated Creatinine Clearance: 29.9 mL/min (A) (by C-G formula based on SCr of 1.2 mg/dL (H)). Liver Function Tests: Recent Labs  Lab 02/22/18 1318  AST 23  ALT 19  ALKPHOS 50  BILITOT 0.7  PROT 6.1*  ALBUMIN 3.8   No results for input(s): LIPASE, AMYLASE in the last 168 hours. No results for input(s): AMMONIA in the last 168 hours. Coagulation Profile: Recent Labs  Lab 02/22/18 1318  INR 1.00   Cardiac Enzymes:  Radiology Studies: Reviewed images personally in health database   Scheduled Meds: . amLODipine  5 mg Oral BID  . insulin aspart  0-5 Units Subcutaneous QHS  . insulin aspart  0-9 Units Subcutaneous TID WC  . metoprolol succinate  100 mg Oral Daily   Continuous Infusions: . sodium chloride 50 mL/hr at 02/22/18 2332  . sodium chloride       LOS: 0 days    Time spent: 78  Pleas Koch, MD [facility]  If 7PM-7AM, please contact night-coverage [email or web address] Password [facility] 02/23/2018, 7:53 AM

## 2018-02-24 ENCOUNTER — Inpatient Hospital Stay (HOSPITAL_COMMUNITY): Payer: Medicare Other

## 2018-02-24 LAB — URINE CULTURE: Culture: NO GROWTH

## 2018-02-24 LAB — GLUCOSE, CAPILLARY
GLUCOSE-CAPILLARY: 166 mg/dL — AB (ref 65–99)
GLUCOSE-CAPILLARY: 183 mg/dL — AB (ref 65–99)
Glucose-Capillary: 124 mg/dL — ABNORMAL HIGH (ref 65–99)
Glucose-Capillary: 217 mg/dL — ABNORMAL HIGH (ref 65–99)

## 2018-02-24 LAB — HEMOGLOBIN A1C
HEMOGLOBIN A1C: 6.9 % — AB (ref 4.8–5.6)
Mean Plasma Glucose: 151.33 mg/dL

## 2018-02-24 LAB — LIPID PANEL
Cholesterol: 292 mg/dL — ABNORMAL HIGH (ref 0–200)
HDL: 39 mg/dL — ABNORMAL LOW
LDL Cholesterol: 187 mg/dL — ABNORMAL HIGH (ref 0–99)
Total CHOL/HDL Ratio: 7.5 ratio
Triglycerides: 330 mg/dL — ABNORMAL HIGH
VLDL: 66 mg/dL — ABNORMAL HIGH (ref 0–40)

## 2018-02-24 MED ORDER — GUAIFENESIN ER 600 MG PO TB12
600.0000 mg | ORAL_TABLET | Freq: Two times a day (BID) | ORAL | Status: DC
  Administered 2018-02-24 – 2018-02-26 (×3): 600 mg via ORAL
  Filled 2018-02-24 (×5): qty 1

## 2018-02-24 MED ORDER — GADOBENATE DIMEGLUMINE 529 MG/ML IV SOLN
8.0000 mL | Freq: Once | INTRAVENOUS | Status: AC
  Administered 2018-02-24: 8 mL via INTRAVENOUS

## 2018-02-24 MED ORDER — EZETIMIBE 10 MG PO TABS
10.0000 mg | ORAL_TABLET | Freq: Every day | ORAL | Status: DC
  Administered 2018-02-24 – 2018-02-26 (×3): 10 mg via ORAL
  Filled 2018-02-24 (×3): qty 1

## 2018-02-24 NOTE — Evaluation (Signed)
Speech Language Pathology Evaluation Patient Details Name: Charlene Greene MRN: [id number] DOB: 09/30/1922 Today's Date: 02/24/2018 Time: 7829-5621 SLP Time Calculation (min) (ACUTE ONLY): 10 min  Problem List:  Patient Active Problem List   Diagnosis Date Noted  . TIA (transient ischemic attack) 02/22/2018  . Labile blood glucose   . Hypotension due to drugs   . Hyponatremia   . Renovascular hypertension   . Hypertensive crisis   . Type 2 diabetes mellitus with diabetic autonomic neuropathy, without long-term current use of insulin (HCC)   . Stage 3 chronic kidney disease (HCC)   . Acute lower UTI   . Acute renal insufficiency 09/27/2016  . Urinary urgency 09/27/2016  . Basal ganglia infarction (HCC) 09/26/2016  . Neuropathic pain   . Benign essential HTN   . Slow transit constipation   . Elevated creatine kinase   . Accelerated hypertension 09/24/2016  . Diabetes mellitus (HCC) 09/24/2016  . Hyperlipidemia 09/24/2016  . Sick sinus syndrome (HCC) 09/24/2016  . CVA (cerebral vascular accident) (HCC) 09/23/2016  . Stroke (cerebrum) (HCC) 09/23/2016   Past Medical History:  Past Medical History:  Diagnosis Date  . CAD (coronary artery disease)   . Diabetes mellitus without complication (HCC)   . Hyperlipidemia   . Hypertension   . Pacemaker   . Renal artery stenosis (HCC)    s/p bilateral stents  . Sick sinus syndrome (HCC)   . Stroke [facility])    2 previous cva's   Past Surgical History:  Past Surgical History:  Procedure Laterality Date  . PACEMAKER PLACEMENT  05/15/2014   MDT Advisa MRI implanted at [facility] for sick sinus syndrome   HPI:  Charlene Greene is a 84 y.o. female with medical history significant of CVA 2017, DM 2, HTN, sick sinus syndrome, sp pacemaker, diastolic CHF presented with left side weakness, slurred speech and facial droop similar to prior presentation of CVA in 2107. Per chart pt reports 1 week of cough sometimes worse with eating. Patient had  dysphasia with prior CVA but it resolved and she is on regular diet. CXR No edema or consolidation. Head CT No acute intracranial abnormality. BSE 09/2016 mild oropharyngeal dysphagia, reg/thin recommended.   Assessment / Plan / Recommendation Clinical Impression  It was difficult to adequately assess pt's cognition and develop differential diagnosis of cognitive abilities as compared to previous CVA. MD requested SLE to further investigate speech and cognitive deficits but focus on ST services have largely been on diet toleration. During this evaluation, son present and states that while pt has taken "a while" to regain ability following previous CVA (2017) she was just at cognitive baseline on Wednesday. Md and staff report some slurred speech however none present at this time and son provides information about physical deficits only. No neurological deficits identified with MRI ordered. Will continue to follow for diet advancement and safety and target cognitive deficits should any arise. At this time, no acute cognitive deficits are identified.     SLP Assessment  SLP Recommendation/Assessment: Patient needs continued Speech Lanaguage Pathology Services SLP Visit Diagnosis: Cognitive communication deficit (R41.841)    Follow Up Recommendations  Skilled Nursing facility    Frequency and Duration min 2x/week  2 weeks      SLP Evaluation Cognition  Overall Cognitive Status: Impaired/Different from baseline Arousal/Alertness: Awake/alert Orientation Level: Oriented to situation;Oriented to person;Disoriented to time;Oriented to place Attention: Selective Selective Attention: Appears intact Memory: (impaired at baseline) Problem Solving: Impaired Problem Solving Impairment: Functional basic  Comprehension  Auditory Comprehension Overall Auditory Comprehension: Appears within functional limits for tasks assessed Yes/No Questions: Within Functional Limits(basic) Commands: Within  Functional Limits(simple one step) Conversation: Simple Visual Recognition/Discrimination Discrimination: Not tested Reading Comprehension Reading Status: Not tested    Expression Expression Primary Mode of Expression: Verbal Verbal Expression Overall Verbal Expression: Appears within functional limits for tasks assessed Written Expression Dominant Hand: Right Written Expression: Not tested   Oral / Motor  Oral Motor/Sensory Function Overall Oral Motor/Sensory Function: Within functional limits Motor Speech Overall Motor Speech: Appears within functional limits for tasks assessed Articulation: Within functional limitis Intelligibility: Intelligible Motor Planning: Witnin functional limits Motor Speech Errors: Not applicable   GO                    Desi Carby 02/24/2018, 3:41 PM

## 2018-02-24 NOTE — NC FL2 (Signed)
[place] MEDICAID FL2 LEVEL OF CARE SCREENING TOOL     IDENTIFICATION  Patient Name: Charlene Greene Birthdate: 11-18-1921 Sex: female Admission Date (Current Location): 02/22/2018  [facility] and [place] Number:  Producer, television/film/video and Address:  [facility] [place]. [facility], [address], [place], [place] [address]      Provider Number: [id number]  Attending Physician Name and Address:  Rhetta Mura, MD  Relative Name and Phone Number:       Current Level of Care: Hospital Recommended Level of Care: Skilled Nursing Facility Prior Approval Number:    Date Approved/Denied:   PASRR Number: [id number]  Discharge Plan: SNF    Current Diagnoses: Patient Active Problem List   Diagnosis Date Noted  . TIA (transient ischemic attack) 02/22/2018  . Labile blood glucose   . Hypotension due to drugs   . Hyponatremia   . Renovascular hypertension   . Hypertensive crisis   . Type 2 diabetes mellitus with diabetic autonomic neuropathy, without long-term current use of insulin (HCC)   . Stage 3 chronic kidney disease (HCC)   . Acute lower UTI   . Acute renal insufficiency 09/27/2016  . Urinary urgency 09/27/2016  . Basal ganglia infarction (HCC) 09/26/2016  . Neuropathic pain   . Benign essential HTN   . Slow transit constipation   . Elevated creatine kinase   . Accelerated hypertension 09/24/2016  . Diabetes mellitus (HCC) 09/24/2016  . Hyperlipidemia 09/24/2016  . Sick sinus syndrome (HCC) 09/24/2016  . CVA (cerebral vascular accident) (HCC) 09/23/2016  . Stroke (cerebrum) (HCC) 09/23/2016    Orientation RESPIRATION BLADDER Height & Weight     Self, Time  Normal Incontinent Weight: 167 lb 5.3 oz (75.9 kg) Height:  5\' 5"  (165.1 cm)  BEHAVIORAL SYMPTOMS/MOOD NEUROLOGICAL BOWEL NUTRITION STATUS      Continent Diet(see DC summary)  AMBULATORY STATUS COMMUNICATION OF NEEDS Skin   Extensive Assist Verbally Surgical wounds                        Personal Care Assistance Level of Assistance  Bathing, Dressing Bathing Assistance: Maximum assistance   Dressing Assistance: Maximum assistance     Functional Limitations Info             SPECIAL CARE FACTORS FREQUENCY  PT (By licensed PT), OT (By licensed OT)     PT Frequency: 5/wk OT Frequency: 5/wk            Contractures      Additional Factors Info  Code Status, Allergies, Insulin Sliding Scale Code Status Info: DNR Allergies Info: Statins, Other, Amlodipine, Celecoxib, Codeine, Naproxen, Piroxicam   Insulin Sliding Scale Info: 4/day       Current Medications (02/24/2018):  This is the current hospital active medication list Current Facility-Administered Medications  Medication Dose Route Frequency Provider Last Rate Last Dose  . 0.9 %  sodium chloride infusion   Intravenous Continuous Therisa Doyne, MD   Stopped at 02/23/18 1126  . acetaminophen (TYLENOL) tablet 650 mg  650 mg Oral Q4H PRN Therisa Doyne, MD       Or  . acetaminophen (TYLENOL) solution 650 mg  650 mg Per Tube Q4H PRN Doutova, Anastassia, MD       Or  . acetaminophen (TYLENOL) suppository 650 mg  650 mg Rectal Q4H PRN Doutova, Anastassia, MD      . albuterol (PROVENTIL) (2.5 MG/3ML) 0.083% nebulizer solution 2.5 mg  2.5 mg Nebulization Q4H PRN Doutova,  Anastassia, MD      . amLODipine (NORVASC) tablet 5 mg  5 mg Oral BID Doutova, Anastassia, MD   5 mg at 02/24/18 1026  . clopidogrel (PLAVIX) tablet 75 mg  75 mg Oral Daily Amankwah, Pearl, NP   75 mg at 02/24/18 1026  . insulin aspart (novoLOG) injection 0-5 Units  0-5 Units Subcutaneous QHS Therisa Doyneoutova, Anastassia, MD   2 Units at 02/23/18 2300  . insulin aspart (novoLOG) injection 0-9 Units  0-9 Units Subcutaneous TID WC Therisa Doyneoutova, Anastassia, MD   1 Units at 02/24/18 0740  . metoprolol succinate (TOPROL-XL) 24 hr tablet 100 mg  100 mg Oral Daily Doutova, Anastassia, MD   100 mg at 02/24/18 1026  . RESOURCE THICKENUP CLEAR   Oral PRN  Rhetta MuraSamtani, Jai-Gurmukh, MD      . senna-docusate (Senokot-S) tablet 1 tablet  1 tablet Oral QHS PRN Doutova, Anastassia, MD      . sodium chloride 0.9 % bolus 1,000 mL  1,000 mL Intravenous Once Therisa Doyneoutova, Anastassia, MD         Discharge Medications: Please see discharge summary for a list of discharge medications.  Relevant Imaging Results:  Relevant Lab Results:   Additional Information SS#: [id number]  Burna SisUris, Huberta Tompkins H, LCSW

## 2018-02-24 NOTE — Progress Notes (Signed)
  Speech Language Pathology Treatment: Dysphagia  Patient Details Name: Charlene Greene MRN: [id number] DOB: 1922/06/18 Today's Date: 02/24/2018 Time: [id number] SLP Time Calculation (min) (ACUTE ONLY): 8 min  Assessment / Plan / Recommendation Clinical Impression  Skilled treatment session focused on dysphagia goals. SLP received pt in bed with son present. Pt has baseline congested cough while speaking with SLP. With trials of thin liquids via spoon and small sips via cup, pt with immediate cough and increased delayed cough. Pt appears with decreased cough strength. Immediate cough appeared directly related to consumption of thin liquids as SLP could hear possible pharyngeal residue that triggered cough. Recommend MBS at next available date. Education provided to son on need for instrumental testing and he and pt are in agreement. Continue dysphagia 3 with nectar thick liquids.     HPI HPI: Charlene Griffinsell Guerrant is a 84 y.o. female with medical history significant of CVA 2017, DM 2, HTN, sick sinus syndrome, sp pacemaker, diastolic CHF presented with left side weakness, slurred speech and facial droop similar to prior presentation of CVA in 2107. Per chart pt reports 1 week of cough sometimes worse with eating. Patient had dysphasia with prior CVA but it resolved and she is on regular diet. CXR No edema or consolidation. Head CT No acute intracranial abnormality. BSE 09/2016 mild oropharyngeal dysphagia, reg/thin recommended.      SLP Plan  MBS;Continue with current plan of care       Recommendations  Diet recommendations: Dysphagia 3 (mechanical soft);Nectar-thick liquid Liquids provided via: Cup Medication Administration: Whole meds with puree Supervision: Patient able to self feed Compensations: Slow rate;Small sips/bites Postural Changes and/or Swallow Maneuvers: Seated upright 90 degrees                Oral Care Recommendations: Oral care BID Follow up Recommendations: Skilled Nursing  facility SLP Visit Diagnosis: Dysphagia, pharyngeal phase (R13.13) Plan: MBS;Continue with current plan of care       GO                Atziri Zubiate 02/24/2018, 12:15 PM

## 2018-02-24 NOTE — Progress Notes (Signed)
Hospitalist progress note   Charlene Greene  ZOX:[id number] DOB: 07-22-1922 DOA: 02/22/2018 PCP: [facility]  Specialists:  Dr. Lorelei Pont of cardiology [place]   Brief Narrative:  51 female right basal ganglia infarct, SSS + PPM [place], HLD, DM TY 2+ neuropathy, hyponatremia, bipolar, HTN, reconstructive left breast surgery in the past?  Breast cancer, CAD status post CABG 2001--NST MI 04/2014-at that visit was discussed with patient to take Plavix but because of her anemia this was reconsidered and cardiology recommended medical management it also was felt at that time she would not tolerate a statin S/B extensive rehab stay 09/26/2016-10/11/2016  Admit 4/13 from [facility]F-left-sided weakness, droop, slurred speech +1/52 H/o cough, no fever no chills + dysphagia?--Supposed to be on dysphagia diet previously  Assessment & Plan:   Likely TIA-CT scan head no specific symptoms --neurology input appreciated--last noted to be on aspirin 325 only pharmacist reviewed meds and is not on aspirin anymore either-likely etiology is underlying A. fib-pacemaker interrogated by electrophysiologist and found to be without A. Fib  anticoagulation will have to be balanced with her risk of increasing falls recently Awaiting echocardiogram, MRI report, carotid  Bilateral hip pain-getting x-rays hips-shows no fracture as of 4/14  Supposed to be on dysphagia diet but eating regular apparently? Does not like dysphagia diet but is willing to try it No chest pain and no further cough  lipidemia LDL 187 total cholesterol 811 HDL 39-needs high-dose intensity statin 80 mg Lipitor versus 20 Crestor on DC  Seborrheic keratosis + multiple dark lesions over back-we will need outpatient dermatology referral for possible biopsy  AVN left humerus-occupational therapy to see evaluate and help with management--need skilled care on discharge  Stage I CKD BUN/creatinine 27/1.2-monitor.  Prior  to admission meds include Lasix 10 daily, lisinopril 40 twice daily which has been held--- there is some duplicate meds on the [facility]   HTN permissive hypertension-slow gradual lowering of the same 5-14 days-?  Amlodipine,?  Metoprolol  Diabetes mellitus type 2 complication neuropathy/nephropathy-get A1c TSH-again will ask for clarification of meds by pharmacist and once done will resume pregabalin, hold oral hypoglycemic agent etc. Etc.  SCD, inpatient pending workup,   Consultants:   None yet  Procedures:   CT scan  Antimicrobials:   None  Subjective: Awake alert pleasant no deficit still feels her speech is a little altered no motor deficit however she is able to manipulate and eat but does not like the thickened diet that she is on I spoke to her daughter-in-law Arlester [facility] ICU nurse] in detail this morning and explained to her likely course of events that will need to transpire prior to discharge and she understands completely  Objective: Vitals:   02/24/18 0400 02/24/18 0810 02/24/18 1026 02/24/18 1230  BP: (!) 175/68 (!) 173/65 (!) 183/67 (!) 158/60  Pulse: (!) 59 65 60 89  Resp: 18 18  18   Temp: 97.9 F (36.6 C)   98.9 F (37.2 C)  TempSrc: Oral   Oral  SpO2: 94% 97%  98%  Weight:      Height:        Intake/Output Summary (Last 24 hours) at 02/24/2018 1648 Last data filed at 02/23/2018 2300 Gross per 24 hour  Intake 120 ml  Output -  Net 120 ml   Filed Weights   02/22/18 1316 02/22/18 2148  Weight: 82.1 kg (181 lb) 75.9 kg (167 lb 5.3 oz)    Examination:  Smile symmetric no distress Mallampati  2 S1-S2 no murmur, no JVD no bruit Skin lesions with somewhat hyperpigmented areas and irregularity on back Abdomen soft nontender no rebound Chest is clear without added sound no egophony no rales Lower extremities is soft no swelling no other skin lesion   Data Reviewed: I have personally reviewed following labs and imaging  studies  CBC: Recent Labs  Lab 02/22/18 1318 02/22/18 1355  WBC 8.0  --   NEUTROABS 5.5  --   HGB 13.5 13.6  HCT 40.6 40.0  MCV 88.5  --   PLT 272  --    Basic Metabolic Panel: Recent Labs  Lab 02/22/18 1318 02/22/18 1355  NA 136 138  K 4.2 4.3  CL 99* 100*  CO2 27  --   GLUCOSE 176* 173*  BUN 24* 27*  CREATININE 1.11* 1.20*  CALCIUM 9.1  --    GFR: Estimated Creatinine Clearance: 29.9 mL/min (A) (by C-G formula based on SCr of 1.2 mg/dL (H)). Liver Function Tests: Recent Labs  Lab 02/22/18 1318  AST 23  ALT 19  ALKPHOS 50  BILITOT 0.7  PROT 6.1*  ALBUMIN 3.8   No results for input(s): LIPASE, AMYLASE in the last 168 hours. No results for input(s): AMMONIA in the last 168 hours. Coagulation Profile: Recent Labs  Lab 02/22/18 1318  INR 1.00   Cardiac Enzymes:  Radiology Studies: Reviewed images personally in health database   Scheduled Meds: . amLODipine  5 mg Oral BID  . clopidogrel  75 mg Oral Daily  . insulin aspart  0-5 Units Subcutaneous QHS  . insulin aspart  0-9 Units Subcutaneous TID WC  . metoprolol succinate  100 mg Oral Daily   Continuous Infusions: . sodium chloride Stopped (02/23/18 1126)  . sodium chloride       LOS: 1 day    Time spent: 2835  Pleas KochJai Artemio Dobie, MD [facility](P) [phone]  If 7PM-7AM, please contact night-coverage [email or web address] Password [email or web address] 02/24/2018, 4:48 PM

## 2018-02-24 NOTE — Care Management Note (Signed)
Case Management Note  Patient Details  Name: Charlene Greene MRN: [id number] Date of Birth: 1922/06/06  Subjective/Objective:        Pt in with a fall. [facility].            Action/Plan: PT/OT recommending SNF. CM following for d/c disposition.  Expected Discharge Date:  02/24/18               Expected Discharge Plan:  Skilled Nursing Facility  In-House Referral:  Clinical Social Work  Discharge planning Services     Post Acute Care Choice:    Choice offered to:     DME Arranged:    DME Agency:     HH Arranged:    HH Agency:     Status of Service:  In process, will continue to follow  If discussed at Long Length of Stay Meetings, dates discussed:    Additional Comments:  Kermit BaloKelli F Sirenia Whitis, RN 02/24/2018, 4:01 PM

## 2018-02-24 NOTE — Progress Notes (Signed)
I intended to do the echo today but the patient was in MRI.

## 2018-02-24 NOTE — Progress Notes (Signed)
Stroke Team Progress Note   Charlene Greene is a 84 y.o. 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  Per family, she had symptoms presenting the same way as a stroke she had in November 2017 which left her with residual left upper and lower extremity weakness and left leg foot drop. At baseline she is a walker and wheelchair. Per family on Wednesday patient was not feeling well; they noted she had increased weakness on her left side but symptoms improved. By Friday night she then had left-sided weakness, slurred speech with some word finding difficulties. Likely woke up with the symptoms at night.  On Saturday morning they noticed symptoms still persisted, with alternating left and right sided facial droop and by Saturday night family decided to bring the patient to the ER for evaluation. CT of the head did not show any acute intracranial abnormality.   Neuro was consulted for evaluation.  On assessment of patient she is awake, alert and oriented except for to date, month and year; per family at baseline patient has  memory loss issues.  Facial droop, slurred speech and word finding difficulties have since resolved.  However, the patient continues to have persistent left-sided weakness, likely worsened from her previous baseline.  Of note, patient has had a complaint of a cough with mild sputum production, but denies fever, chills, nausea or vomiting, recent falls, trauma, headaches or dizziness.  LSN: 02/21/18 tPA Given: No: Outside of TPA window Premorbid modified Rankin scale (mRS):2   SUBJECTIVE  She states she  is doing better. His speech is improved back to normal. He has some minimal left-sided weakness. He has some residual right-sided weakness from a previous stroke in 2017.  OBJECTIVE Most recent  Vital Signs: Temp: 98.9 F (37.2 C) (04/15 1230) Temp Source: Oral (04/15 1230) BP: 158/60 (04/15 1230) Pulse Rate: 89 (04/15 1230) Respiratory Rate: 18 O2 Saturdation: 98%  CBG (last 3)  Recent Labs    02/23/18 2255 02/24/18 0606 02/24/18 1204  GLUCAP 246* 124* 217*    Diet: Fall precautions Aspiration precautions Fall precautions Fall precautions Fall precautions DIET DYS 3 Room service appropriate? Yes; Fluid consistency: Nectar Thick   liquids  Activity: Up with assistance   VTE Prophylaxis:  scds  Studies: Results for orders placed or performed during the hospital encounter of 02/22/18 (from the past 24 hour(s))  Glucose, capillary     Status: Abnormal   Collection Time: 02/23/18  4:50 PM  Result Value Ref Range   Glucose-Capillary 179 (H) 65 - 99 mg/dL  Glucose, capillary     Status: Abnormal   Collection Time: 02/23/18  9:56 PM  Result Value Ref Range   Glucose-Capillary 232 (H) 65 - 99 mg/dL   Comment 1 Notify RN    Comment 2 Document in Chart   Glucose, capillary     Status: Abnormal   Collection Time: 02/23/18 10:55 PM  Result Value Ref Range   Glucose-Capillary 246 (H) 65 - 99 mg/dL  Hemoglobin Z6X     Status: Abnormal   Collection Time: 02/24/18  3:49 AM  Result Value Ref Range   Hgb A1c MFr Bld 6.9 (H) 4.8 - 5.6 %   Mean Plasma Glucose 151.33 mg/dL  Lipid panel     Status: Abnormal   Collection Time: 02/24/18  3:49 AM  Result Value Ref Range   Cholesterol 292 (H) 0 - 200 mg/dL   Triglycerides 098330 (H) <150 mg/dL   HDL 39 (L) >11>40 mg/dL   Total CHOL/HDL Ratio 7.5 RATIO   VLDL 66 (H) 0 - 40 mg/dL   LDL Cholesterol 914187 (H) 0 - 99 mg/dL  Glucose, capillary     Status: Abnormal   Collection Time: 02/24/18  6:06 AM  Result Value Ref Range   Glucose-Capillary 124 (H) 65 - 99 mg/dL   Comment 1 Notify RN    Comment 2 Document in Chart   Glucose, capillary     Status: Abnormal   Collection Time: 02/24/18 12:04 PM  Result Value Ref Range    Glucose-Capillary 217 (H) 65 - 99 mg/dL   Comment 1 Notify RN    Comment 2 Document in Chart      Dg Chest 2 View  Result Date: 02/22/2018 CLINICAL DATA:  Left-sided weakness and slurred speech EXAM: CHEST - 2 VIEW COMPARISON:  September 28, 2016 FINDINGS: There is no edema or consolidation. Heart size and pulmonary vascularity are normal. Pacemaker leads are attached to the right atrium and right ventricle. There is aortic atherosclerosis. No adenopathy. There is degenerative change in both shoulders. Remodeling of the left humeral head is indicative of chronic avascular necrosis. IMPRESSION: Pacemaker leads attached right atrium and right ventricle. Heart size normal. There is aortic atherosclerosis. No edema or consolidation. There is avascular necrosis in the left humeral head. Aortic Atherosclerosis (ICD10-I70.0). Electronically Signed   By: Bretta BangWilliam  Woodruff III M.D.   On: 02/22/2018 16:02   Dg Hips Bilat With Pelvis 2v  Result Date: 02/23/2018 CLINICAL DATA:  Bilateral hip pain EXAM: DG HIP (WITH OR WITHOUT PELVIS) 2V BILAT COMPARISON:  None. FINDINGS: No fracture or dislocation is seen. Bilateral hip joint spaces are symmetric. Visualized bony pelvis appears intact. Calcified fibroid overlying the left pelvis. Vascular calcifications. IMPRESSION: No acute osseus abnormality is seen. Electronically Signed   By: Charline BillsSriyesh  Krishnan M.D.   On: 02/23/2018 19:57    Physical Exam:    Pleasant elderly female currently not in distress. . Afebrile. Head is nontraumatic. Neck is supple without bruit.    Cardiac exam no murmur or gallop. Lungs are clear to auscultation. Distal pulses are well felt. Neurological Exam :  Awake alert oriented 2. Diminished attention, registration and recall. Poor short-term memory. Follows simple 1 step and occasional two-step commands. Extraocular moments are full range without nystagmus. Blinks to threat bilaterally. Vision acuity and fields seem adequate. Mild left lower  facial asymmetry. Tongue midline. Motor system exam reveals no drift but mild weakness of Grip and intrinsic hand muscles. Non-fixed flexion contractures of the right hand fingers. Orbits right over left upper extremity. Symmetric lower eczema to stand 4/5. Deep tendon reflexes are 2+ brisk bilaterally. Plantars are equivocal. Sensation appears intact. Coordination is slow but accurate bilaterally. Gait was not tested. ASSESSMENT Charlene Greene is a 84 y.o. female with  Transient slurred speech and left-sided weakness secondary to right brain TIA, on clopidogrel 75 mg daily for secondary stroke prevention, prior history of right basal ganglia infarct in November 2017.   Marland Kitchen.  Hospital day # 1  TREATMENT/PLAN  recommend ongoing stroke evaluation by checking carotid ultrasound, 2-D echo, lipid profile and hemoglobin A 1C. Recommend dual antiplatelet therapy of aspirin and Plavix for 3 weeks followed by aspirin alone.Cannot obtain MRI due to pacemaker. Family not available at the bedside for discussion. Discussed with Dr. Mahala MenghiniSamtani.Therapy  consults. Mobilize out of bed. Greater than 50% time during this 25 minute visit was spent on counseling and coordination of care about her TIA and previous stroke and answering questions.  Delia Heady, MD [facility] Pager: [phone] 02/24/2018 3:31 PM

## 2018-02-25 ENCOUNTER — Inpatient Hospital Stay (HOSPITAL_COMMUNITY): Payer: Medicare Other

## 2018-02-25 DIAGNOSIS — R55 Syncope and collapse: Secondary | ICD-10-CM

## 2018-02-25 LAB — GLUCOSE, CAPILLARY
GLUCOSE-CAPILLARY: 245 mg/dL — AB (ref 65–99)
GLUCOSE-CAPILLARY: 170 mg/dL — AB (ref 65–99)
Glucose-Capillary: 125 mg/dL — ABNORMAL HIGH (ref 65–99)
Glucose-Capillary: 218 mg/dL — ABNORMAL HIGH (ref 65–99)

## 2018-02-25 LAB — ECHOCARDIOGRAM COMPLETE
Height: 65 in
Weight: 2677.27 [oz_av]

## 2018-02-25 MED ORDER — ASPIRIN EC 81 MG PO TBEC
81.0000 mg | DELAYED_RELEASE_TABLET | Freq: Every day | ORAL | Status: DC
  Administered 2018-02-25 – 2018-02-26 (×2): 81 mg via ORAL
  Filled 2018-02-25 (×2): qty 1

## 2018-02-25 NOTE — Progress Notes (Signed)
Modified Barium Swallow Progress Note  Patient Details  Name: Charlene Greene MRN: [id number] Date of Birth: 1922-07-05  Today's Date: 02/25/2018  Modified Barium Swallow completed.  Full report located under Chart Review in the Imaging Section.  Brief recommendations include the following:  Clinical Impression    Pt presents with a mild oropharyngeal dysphagia.  Pt has decreased oral control of boluses in the setting of mild base of tongue weakness which leads to premature spillage and delay in swallow initiation to the level of the pyriforms.  This, in combination with decreased airway closure during the swallow, lead to penetration with nectar thick liquids (instance of deep penetration on initial sip which improved to flash penetration on subsequent boluses). Pt also had deep penetration with small controlled sips of thin liquids which worsened to aspiration when challenged to take large, consecutive boluses.  Furthermore, pt aspirated thin liquids when consuming mixed consistency boluses.  Aspiration of thins alone was trace in amount and went unsensed by pt; however, larger amounts of aspiration seen following mixed consistency boluses elicited a strong cough response.  Mild pharyngeal residue was also evident throughout study, suspect due to a combination of decreased UES relaxation and base of tongue weakness.  Taking small sips followed by a second swallow to clear residue appeared to be an effective technique for maximizing airway protection with all consistencies.  For now, recommend that pt remain on dys 3 textures and nectar thick liquids with full supervision for use of swallowing precautions.  Pt may have sips of water in between meals following oral care with full staff supervision per the water protocol.     Swallow Evaluation Recommendations       SLP Diet Recommendations: Dysphagia 3 (Mech soft) solids;Nectar thick liquid   Liquid Administration via: Cup;Straw   Medication  Administration: Crushed with puree(large pills crushed, small pills whole)   Supervision: Full supervision/cueing for compensatory strategies   Compensations: Slow rate;Small sips/bites   Postural Changes: Seated upright at 90 degrees   Oral Care Recommendations: Oral care BID        Charlene Greene, Charlene Greene 02/25/2018,10:54 AM

## 2018-02-25 NOTE — Progress Notes (Signed)
Stroke Team Progress Note      SUBJECTIVE  She states she  is doing better. Her speech is improved   MRI scan of the brain shows patchy left PCA infarct involving thalamus, occipital lobe and medial temporal lobe. MRA of the brain shows widespread intracranial atherosclerotic changes. MRA of the neck shows no significant extracranial stenosis. Echocardiogram was unremarkable. LDL cholesterol is significantly elevatedand eliminate A1c is borderline at 6.9. OBJECTIVE Most recent Vital Signs: Temp: 97.7 F (36.5 C) (04/16 1151) Temp Source: Oral (04/16 1151) BP: 179/67 (04/16 1151) Pulse Rate: 61 (04/16 1151) Respiratory Rate: 16 O2 Saturdation: 99%  CBG (last 3)  Recent Labs    02/24/18 2159 02/25/18 0608 02/25/18 1140  GLUCAP 183* 125* 245*    Diet: Fall precautions Aspiration precautions Fall precautions Fall precautions Fall precautions DIET DYS 3 Room service appropriate? Yes; Fluid consistency: Nectar Thick   liquids  Activity: Up with assistance   VTE Prophylaxis:  scds  Studies: Results for orders placed or performed during the hospital encounter of 02/22/18 (from the past 24 hour(s))  Glucose, capillary     Status: Abnormal   Collection Time: 02/24/18  5:23 PM  Result Value Ref Range   Glucose-Capillary 166 (H) 65 - 99 mg/dL   Comment 1 Notify RN    Comment 2 Document in Chart   Glucose, capillary     Status: Abnormal   Collection Time: 02/24/18  9:59 PM  Result Value Ref Range   Glucose-Capillary 183 (H) 65 - 99 mg/dL  Glucose, capillary     Status: Abnormal   Collection Time: 02/25/18  6:08 AM  Result Value Ref Range   Glucose-Capillary 125 (H) 65 - 99 mg/dL   Comment 1 Notify RN    Comment 2 Document in Chart   Glucose, capillary     Status: Abnormal   Collection Time: 02/25/18 11:40 AM  Result Value Ref Range   Glucose-Capillary 245 (H) 65 - 99 mg/dL   Comment 1 Notify RN    Comment 2 Document in Chart      Mr Maxine Glenn Neck W Wo Contrast  Result  Date: 02/24/2018 CLINICAL DATA:  Initial evaluation for acute left-sided weakness, slurred speech, left facial droop. EXAM: MRI HEAD WITHOUT CONTRAST MRA HEAD WITHOUT CONTRAST MRA NECK WITHOUT CONTRAST TECHNIQUE: Multiplanar, multiecho pulse sequences of the brain and surrounding structures were obtained without intravenous contrast. Angiographic images of the Circle of Willis were obtained using MRA technique without intravenous contrast. Angiographic images of the neck were obtained using MRA technique without intravenous contrast. Carotid stenosis measurements (when applicable) are obtained utilizing NASCET criteria, using the distal internal carotid diameter as the denominator. COMPARISON:  Prior CT from 02/22/2018. FINDINGS: MRI HEAD FINDINGS Brain: Diffuse prominence of the CSF containing spaces is compatible with generalized age-related cerebral atrophy. Remote lacunar infarcts involving the right basal ganglia and left thalamus noted. Patchy T2/FLAIR hyperintensity involving the periventricular, deep, and subcortical white matter both cerebral hemispheres most consistent with chronic small vessel ischemic disease. Changes are overall moderate in nature. Patchy small volume diffusion abnormality seen involving the left thalamus/posterior limb of the left internal capsule, consistent with acute ischemic infarct (series 5001, image 61, 60, 59). Additional 5 mm acute ischemic cortical infarct present within the left occipital lobe (series 5001, image 57). Mild diffusion abnormality seen involving the mesial left temporal lobe/left hippocampus (series 5001, image 58). Changes primarily involve the left PCA territory. No associated hemorrhage or mass effect. No other evidence for acute  or subacute ischemia. Gray-white matter differentiation otherwise maintained. No foci of susceptibility artifact to suggest acute or chronic intracranial hemorrhage. No mass lesion, midline shift or mass effect. No hydrocephalus. No  extra-axial fluid collection. Major dural sinuses grossly patent. Pituitary gland suprasellar region normal. Midline structures intact and normal. Vascular: Major intracranial vascular flow voids are maintained. Skull and upper cervical spine: Craniocervical junction normal. Upper cervical spine within normal limits. Bone marrow signal intensity normal. No scalp soft tissue abnormality. Sinuses/Orbits: Globes and orbital soft tissues within normal limits. Patient status post cataract extraction bilaterally. Paranasal sinuses are largely clear. No mastoid effusion. Inner ear structures grossly normal. Other: None. MRA HEAD FINDINGS ANTERIOR CIRCULATION: Distal cervical segments of the internal carotid arteries are patent with antegrade flow. Petrous segments widely patent bilaterally. Scattered atheromatous irregularity within the cavernous/supraclinoid ICAs bilaterally. Secondary moderate stenosis of approximately 50% at the supraclinoid right ICA (series 1001, image 100). No other significant flow-limiting stenosis. 4 mm right paraophthalmic aneurysm noted (series 1001, image 88). ICA termini patent bilaterally. Atheromatous irregularity with moderate to severe stenoses present within the left A1 segment. Right A1 segment irregular but widely patent. Short-segment moderate to severe left A2 stenosis noted. There is severe near occlusive stenosis of the proximal right A2 segment (series 1001, image 129). Scant irregular flow seen distally within the right A2 segment. Extensive atheromatous irregularity throughout the left M1 segment with moderate proximal and mid left M1 stenoses. Short-segment severe proximal left M2 stenoses noted. Extensive atheromatous irregularity throughout the left MCA branches distally. Right M1 segment patent without stenosis or occlusion. There appears to be a proximal right M2 occlusion, inferior division (series 1035001, image 12). Extensive atheromatous irregularity throughout the  remainder of the right MCA branches which do remain patent. POSTERIOR CIRCULATION: Vertebral arteries are code dominant. Left vertebral artery patent without flow-limiting stenosis. Severe proximal right V4 stenosis noted. Posterior inferior cerebral arteries patent proximally. Basilar artery patent to its distal aspect without flow-limiting stenosis. Superior cerebral arteries patent bilaterally. Short-segment severe bilateral P1 stenoses noted. Prominent bilateral posterior communicating arteries noted. Severe proximal left P2 stenosis with near occlusion. Scant flow seen distally within the left PCA. Additional severe near occlusive distal right P2 stenosis. MRA NECK FINDINGS Examination somewhat technically limited by lack of IV contrast. Visualized aortic arch of normal caliber with normal branch pattern. No flow-limiting stenosis about the origin of the great vessels. Visualized subclavian arteries widely patent bilaterally. Right common carotid artery patent from its origin to the bifurcation without stenosis. Mild atheromatous irregularity about the right carotid bifurcation without flow-limiting stenosis. Right ICA mildly tortuous but widely patent distally to the skull base without stenosis or occlusion. Left common carotid artery tortuous proximally but widely patent to the bifurcation without stenosis. Mild atheromatous irregularity about the left bifurcation without flow-limiting stenosis. Left ICA tortuous but widely patent distally to the skull base without stenosis or occlusion. Left vertebral artery arises separately from the aortic arch. Vertebral arteries widely patent within the neck without stenosis or occlusion. IMPRESSION: MRI HEAD IMPRESSION: 1. Patchy small volume acute ischemic nonhemorrhagic infarct involving the left thalamus, left occipital lobe, and mesial left temporal lobe, left PCA territory. 2. Remote lacunar infarcts involving the right basal ganglia and left thalamus. 3. Moderate  chronic small vessel ischemic disease. MRA HEAD IMPRESSION: 1. Extensive widespread intracranial atherosclerotic change throughout the intracranial circulation as above. Notable findings include moderate left M1 with severe left M2 stenoses, severe near occlusive right A2 stenosis, chronic right M2 occlusion, severe  right V4 stenosis, and severe near occlusive bilateral P1 and P2 stenoses. 2. 4 mm right paraophthalmic aneurysm. MRA NECK IMPRESSION: Negative MRA of the neck. No high-grade or critical flow limiting stenosis within either carotid artery system. Vertebral arteries widely patent within the neck. Electronically Signed   By: Rise Mu M.D.   On: 02/24/2018 17:46   Mr Brain Wo Contrast  Result Date: 02/24/2018 CLINICAL DATA:  Initial evaluation for acute left-sided weakness, slurred speech, left facial droop. EXAM: MRI HEAD WITHOUT CONTRAST MRA HEAD WITHOUT CONTRAST MRA NECK WITHOUT CONTRAST TECHNIQUE: Multiplanar, multiecho pulse sequences of the brain and surrounding structures were obtained without intravenous contrast. Angiographic images of the Circle of Willis were obtained using MRA technique without intravenous contrast. Angiographic images of the neck were obtained using MRA technique without intravenous contrast. Carotid stenosis measurements (when applicable) are obtained utilizing NASCET criteria, using the distal internal carotid diameter as the denominator. COMPARISON:  Prior CT from 02/22/2018. FINDINGS: MRI HEAD FINDINGS Brain: Diffuse prominence of the CSF containing spaces is compatible with generalized age-related cerebral atrophy. Remote lacunar infarcts involving the right basal ganglia and left thalamus noted. Patchy T2/FLAIR hyperintensity involving the periventricular, deep, and subcortical white matter both cerebral hemispheres most consistent with chronic small vessel ischemic disease. Changes are overall moderate in nature. Patchy small volume diffusion abnormality  seen involving the left thalamus/posterior limb of the left internal capsule, consistent with acute ischemic infarct (series 5001, image 61, 60, 59). Additional 5 mm acute ischemic cortical infarct present within the left occipital lobe (series 5001, image 57). Mild diffusion abnormality seen involving the mesial left temporal lobe/left hippocampus (series 5001, image 58). Changes primarily involve the left PCA territory. No associated hemorrhage or mass effect. No other evidence for acute or subacute ischemia. Gray-white matter differentiation otherwise maintained. No foci of susceptibility artifact to suggest acute or chronic intracranial hemorrhage. No mass lesion, midline shift or mass effect. No hydrocephalus. No extra-axial fluid collection. Major dural sinuses grossly patent. Pituitary gland suprasellar region normal. Midline structures intact and normal. Vascular: Major intracranial vascular flow voids are maintained. Skull and upper cervical spine: Craniocervical junction normal. Upper cervical spine within normal limits. Bone marrow signal intensity normal. No scalp soft tissue abnormality. Sinuses/Orbits: Globes and orbital soft tissues within normal limits. Patient status post cataract extraction bilaterally. Paranasal sinuses are largely clear. No mastoid effusion. Inner ear structures grossly normal. Other: None. MRA HEAD FINDINGS ANTERIOR CIRCULATION: Distal cervical segments of the internal carotid arteries are patent with antegrade flow. Petrous segments widely patent bilaterally. Scattered atheromatous irregularity within the cavernous/supraclinoid ICAs bilaterally. Secondary moderate stenosis of approximately 50% at the supraclinoid right ICA (series 1001, image 100). No other significant flow-limiting stenosis. 4 mm right paraophthalmic aneurysm noted (series 1001, image 88). ICA termini patent bilaterally. Atheromatous irregularity with moderate to severe stenoses present within the left A1  segment. Right A1 segment irregular but widely patent. Short-segment moderate to severe left A2 stenosis noted. There is severe near occlusive stenosis of the proximal right A2 segment (series 1001, image 129). Scant irregular flow seen distally within the right A2 segment. Extensive atheromatous irregularity throughout the left M1 segment with moderate proximal and mid left M1 stenoses. Short-segment severe proximal left M2 stenoses noted. Extensive atheromatous irregularity throughout the left MCA branches distally. Right M1 segment patent without stenosis or occlusion. There appears to be a proximal right M2 occlusion, inferior division (series 1035001, image 12). Extensive atheromatous irregularity throughout the remainder of the right MCA branches which  do remain patent. POSTERIOR CIRCULATION: Vertebral arteries are code dominant. Left vertebral artery patent without flow-limiting stenosis. Severe proximal right V4 stenosis noted. Posterior inferior cerebral arteries patent proximally. Basilar artery patent to its distal aspect without flow-limiting stenosis. Superior cerebral arteries patent bilaterally. Short-segment severe bilateral P1 stenoses noted. Prominent bilateral posterior communicating arteries noted. Severe proximal left P2 stenosis with near occlusion. Scant flow seen distally within the left PCA. Additional severe near occlusive distal right P2 stenosis. MRA NECK FINDINGS Examination somewhat technically limited by lack of IV contrast. Visualized aortic arch of normal caliber with normal branch pattern. No flow-limiting stenosis about the origin of the great vessels. Visualized subclavian arteries widely patent bilaterally. Right common carotid artery patent from its origin to the bifurcation without stenosis. Mild atheromatous irregularity about the right carotid bifurcation without flow-limiting stenosis. Right ICA mildly tortuous but widely patent distally to the skull base without stenosis or  occlusion. Left common carotid artery tortuous proximally but widely patent to the bifurcation without stenosis. Mild atheromatous irregularity about the left bifurcation without flow-limiting stenosis. Left ICA tortuous but widely patent distally to the skull base without stenosis or occlusion. Left vertebral artery arises separately from the aortic arch. Vertebral arteries widely patent within the neck without stenosis or occlusion. IMPRESSION: MRI HEAD IMPRESSION: 1. Patchy small volume acute ischemic nonhemorrhagic infarct involving the left thalamus, left occipital lobe, and mesial left temporal lobe, left PCA territory. 2. Remote lacunar infarcts involving the right basal ganglia and left thalamus. 3. Moderate chronic small vessel ischemic disease. MRA HEAD IMPRESSION: 1. Extensive widespread intracranial atherosclerotic change throughout the intracranial circulation as above. Notable findings include moderate left M1 with severe left M2 stenoses, severe near occlusive right A2 stenosis, chronic right M2 occlusion, severe right V4 stenosis, and severe near occlusive bilateral P1 and P2 stenoses. 2. 4 mm right paraophthalmic aneurysm. MRA NECK IMPRESSION: Negative MRA of the neck. No high-grade or critical flow limiting stenosis within either carotid artery system. Vertebral arteries widely patent within the neck. Electronically Signed   By: Rise Mu M.D.   On: 02/24/2018 17:46   Dg Swallowing Func-speech Pathology  Result Date: 02/25/2018 Objective Swallowing Evaluation: Type of Study: MBS-Modified Barium Swallow Study  Patient Details Name: Charlene Greene MRN: [id number] Date of Birth: 20-Jan-1922 Today's Date: 02/25/2018 Time: SLP Start Time (ACUTE ONLY): 0930 -SLP Stop Time (ACUTE ONLY): 0948 SLP Time Calculation (min) (ACUTE ONLY): 18 min Past Medical History: Past Medical History: Diagnosis Date . CAD (coronary artery disease)  . Diabetes mellitus without complication (HCC)  . Hyperlipidemia  .  Hypertension  . Pacemaker  . Renal artery stenosis (HCC)   s/p bilateral stents . Sick sinus syndrome (HCC)  . Stroke [facility])   2 previous cva's Past Surgical History: Past Surgical History: Procedure Laterality Date . PACEMAKER PLACEMENT  05/15/2014  MDT Advisa MRI implanted at [facility] for sick sinus syndrome HPI: Charlene Greene is a 84 y.o. female with medical history significant of CVA 2017, DM 2, HTN, sick sinus syndrome, sp pacemaker, diastolic CHF presented with left side weakness, slurred speech and facial droop similar to prior presentation of CVA in 2107. Per chart pt reports 1 week of cough sometimes worse with eating. Patient had dysphasia with prior CVA but it resolved and she is on regular diet. CXR No edema or consolidation. Head CT No acute intracranial abnormality. BSE 09/2016 mild oropharyngeal dysphagia, reg/thin recommended.  Subjective: pt pleasant, cooperative Assessment / Plan / Recommendation CHL IP CLINICAL IMPRESSIONS 02/25/2018 Clinical  Impression Pt presents with a mild oropharyngeal dysphagia.  Pt has decreased oral control of boluses in the setting of mild base of tongue weakness which leads to premature spillage and delay in swallow initiation to the level of the pyriforms.  This, in combination with decreased airway closure during the swallow, lead to penetration with nectar thick liquids (instance of deep penetration on initial sip which improved to flash penetration on subsequent boluses). Pt also had deep penetration with small controlled sips of thin liquids which worsened to aspiration when challenged to take large, consecutive boluses.  Furthermore, pt aspirated thin liquids when consuming mixed consistency boluses.  Aspiration of thins alone was trace in amount and went unsensed by pt; however, larger amounts of aspiration seen following mixed consistency boluses elicited a strong cough response.  Mild pharyngeal residue was also evident throughout study, suspect due to a combination  of decreased UES relaxation and base of tongue weakness.  Taking small sips followed by a second swallow to clear residue appeared to be an effective technique for maximizing airway protection with all consistencies.  For now, recommend that pt remain on dys 3 textures and nectar thick liquids with full supervision for use of swallowing precautions.  Pt may have sips of water in between meals following oral care with full staff supervision per the water protocol.   SLP Visit Diagnosis Dysphagia, oropharyngeal phase (R13.12) Attention and concentration deficit following -- Frontal lobe and executive function deficit following -- Impact on safety and function Mild aspiration risk   CHL IP TREATMENT RECOMMENDATION 02/25/2018 Treatment Recommendations Therapy as outlined in treatment plan below   Prognosis 02/25/2018 Prognosis for Safe Diet Advancement Good Barriers to Reach Goals -- Barriers/Prognosis Comment -- CHL IP DIET RECOMMENDATION 02/25/2018 SLP Diet Recommendations Dysphagia 3 (Mech soft) solids;Nectar thick liquid Liquid Administration via Cup;Straw Medication Administration Crushed with puree Compensations Slow rate;Small sips/bites Postural Changes Seated upright at 90 degrees   CHL IP OTHER RECOMMENDATIONS 02/25/2018 Recommended Consults -- Oral Care Recommendations Oral care BID Other Recommendations --   CHL IP FOLLOW UP RECOMMENDATIONS 02/25/2018 Follow up Recommendations [facility] D[facility] 02/25/2018 Speech Therapy Frequency (ACUTE ONLY) min 2x/week Treatment Duration --      CHL IP ORAL PHASE 02/25/2018 Oral Phase Impaired Oral - Pudding Teaspoon -- Oral - Pudding Cup -- Oral - Honey Teaspoon -- Oral - Honey Cup -- Oral - Nectar Teaspoon -- Oral - Nectar Cup Premature spillage;Decreased bolus cohesion Oral - Nectar Straw Premature spillage Oral - Thin Teaspoon -- Oral - Thin Cup Premature spillage;Decreased bolus cohesion;Weak lingual manipulation Oral - Thin Straw  Premature spillage Oral - Puree -- Oral - Mech Soft -- Oral - Regular -- Oral - Multi-Consistency Premature spillage;Decreased bolus cohesion Oral - Pill Premature spillage;Decreased bolus cohesion Oral Phase - Comment --  CHL IP PHARYNGEAL PHASE 02/25/2018 Pharyngeal Phase Impaired Pharyngeal- Pudding Teaspoon -- Pharyngeal -- Pharyngeal- Pudding Cup -- Pharyngeal -- Pharyngeal- Honey Teaspoon -- Pharyngeal -- Pharyngeal- Honey Cup -- Pharyngeal -- Pharyngeal- Nectar Teaspoon -- Pharyngeal -- Pharyngeal- Nectar Cup Reduced airway/laryngeal closure;Reduced tongue base retraction;Penetration/Aspiration during swallow;Delayed swallow initiation-pyriform sinuses;Pharyngeal residue - valleculae;Pharyngeal residue - pyriform Pharyngeal Material enters airway, CONTACTS cords and not ejected out Pharyngeal- Nectar Straw Delayed swallow initiation-pyriform sinuses;Penetration/Aspiration during swallow;Reduced airway/laryngeal closure;Reduced tongue base retraction;Pharyngeal residue - pyriform;Pharyngeal residue - valleculae Pharyngeal Material enters airway, remains ABOVE vocal cords then ejected out Pharyngeal- Thin Teaspoon -- Pharyngeal -- Pharyngeal- Thin Cup Reduced airway/laryngeal closure;Reduced tongue  base retraction;Delayed swallow initiation-pyriform sinuses;Penetration/Aspiration during swallow Pharyngeal Material enters airway, CONTACTS cords and not ejected out Pharyngeal- Thin Straw Penetration/Aspiration during swallow;Reduced tongue base retraction;Delayed swallow initiation-pyriform sinuses;Reduced airway/laryngeal closure Pharyngeal Material enters airway, passes BELOW cords without attempt by patient to eject out (silent aspiration) Pharyngeal- Puree -- Pharyngeal -- Pharyngeal- Mechanical Soft -- Pharyngeal -- Pharyngeal- Regular -- Pharyngeal -- Pharyngeal- Multi-consistency Delayed swallow initiation-vallecula;Reduced tongue base retraction;Reduced airway/laryngeal closure;Pharyngeal residue -  pyriform;Pharyngeal residue - valleculae;Penetration/Aspiration during swallow Pharyngeal Material enters airway, passes BELOW cords and not ejected out despite cough attempt by patient Pharyngeal- Pill Delayed swallow initiation-vallecula;Reduced tongue base retraction Pharyngeal -- Pharyngeal Comment --  CHL IP CERVICAL ESOPHAGEAL PHASE 02/25/2018 Cervical Esophageal Phase Impaired Pudding Teaspoon -- Pudding Cup -- Honey Teaspoon -- Honey Cup -- Nectar Teaspoon -- Nectar Cup -- Nectar Straw -- Thin Teaspoon -- Thin Cup -- Thin Straw -- Puree -- Mechanical Soft -- Regular -- Multi-consistency -- Pill -- Cervical Esophageal Comment decreased UES relaxation No flowsheet data found. Page, Melanee Spry 02/25/2018, 10:38 AM              Mr Maxine Glenn Head Wo Contrast  Result Date: 02/24/2018 CLINICAL DATA:  Initial evaluation for acute left-sided weakness, slurred speech, left facial droop. EXAM: MRI HEAD WITHOUT CONTRAST MRA HEAD WITHOUT CONTRAST MRA NECK WITHOUT CONTRAST TECHNIQUE: Multiplanar, multiecho pulse sequences of the brain and surrounding structures were obtained without intravenous contrast. Angiographic images of the Circle of Willis were obtained using MRA technique without intravenous contrast. Angiographic images of the neck were obtained using MRA technique without intravenous contrast. Carotid stenosis measurements (when applicable) are obtained utilizing NASCET criteria, using the distal internal carotid diameter as the denominator. COMPARISON:  Prior CT from 02/22/2018. FINDINGS: MRI HEAD FINDINGS Brain: Diffuse prominence of the CSF containing spaces is compatible with generalized age-related cerebral atrophy. Remote lacunar infarcts involving the right basal ganglia and left thalamus noted. Patchy T2/FLAIR hyperintensity involving the periventricular, deep, and subcortical white matter both cerebral hemispheres most consistent with chronic small vessel ischemic disease. Changes are overall moderate in  nature. Patchy small volume diffusion abnormality seen involving the left thalamus/posterior limb of the left internal capsule, consistent with acute ischemic infarct (series 5001, image 61, 60, 59). Additional 5 mm acute ischemic cortical infarct present within the left occipital lobe (series 5001, image 57). Mild diffusion abnormality seen involving the mesial left temporal lobe/left hippocampus (series 5001, image 58). Changes primarily involve the left PCA territory. No associated hemorrhage or mass effect. No other evidence for acute or subacute ischemia. Gray-white matter differentiation otherwise maintained. No foci of susceptibility artifact to suggest acute or chronic intracranial hemorrhage. No mass lesion, midline shift or mass effect. No hydrocephalus. No extra-axial fluid collection. Major dural sinuses grossly patent. Pituitary gland suprasellar region normal. Midline structures intact and normal. Vascular: Major intracranial vascular flow voids are maintained. Skull and upper cervical spine: Craniocervical junction normal. Upper cervical spine within normal limits. Bone marrow signal intensity normal. No scalp soft tissue abnormality. Sinuses/Orbits: Globes and orbital soft tissues within normal limits. Patient status post cataract extraction bilaterally. Paranasal sinuses are largely clear. No mastoid effusion. Inner ear structures grossly normal. Other: None. MRA HEAD FINDINGS ANTERIOR CIRCULATION: Distal cervical segments of the internal carotid arteries are patent with antegrade flow. Petrous segments widely patent bilaterally. Scattered atheromatous irregularity within the cavernous/supraclinoid ICAs bilaterally. Secondary moderate stenosis of approximately 50% at the supraclinoid right ICA (series 1001, image 100). No other significant flow-limiting stenosis. 4 mm right paraophthalmic  aneurysm noted (series 1001, image 88). ICA termini patent bilaterally. Atheromatous irregularity with moderate  to severe stenoses present within the left A1 segment. Right A1 segment irregular but widely patent. Short-segment moderate to severe left A2 stenosis noted. There is severe near occlusive stenosis of the proximal right A2 segment (series 1001, image 129). Scant irregular flow seen distally within the right A2 segment. Extensive atheromatous irregularity throughout the left M1 segment with moderate proximal and mid left M1 stenoses. Short-segment severe proximal left M2 stenoses noted. Extensive atheromatous irregularity throughout the left MCA branches distally. Right M1 segment patent without stenosis or occlusion. There appears to be a proximal right M2 occlusion, inferior division (series [id number], image 12). Extensive atheromatous irregularity throughout the remainder of the right MCA branches which do remain patent. POSTERIOR CIRCULATION: Vertebral arteries are code dominant. Left vertebral artery patent without flow-limiting stenosis. Severe proximal right V4 stenosis noted. Posterior inferior cerebral arteries patent proximally. Basilar artery patent to its distal aspect without flow-limiting stenosis. Superior cerebral arteries patent bilaterally. Short-segment severe bilateral P1 stenoses noted. Prominent bilateral posterior communicating arteries noted. Severe proximal left P2 stenosis with near occlusion. Scant flow seen distally within the left PCA. Additional severe near occlusive distal right P2 stenosis. MRA NECK FINDINGS Examination somewhat technically limited by lack of IV contrast. Visualized aortic arch of normal caliber with normal branch pattern. No flow-limiting stenosis about the origin of the great vessels. Visualized subclavian arteries widely patent bilaterally. Right common carotid artery patent from its origin to the bifurcation without stenosis. Mild atheromatous irregularity about the right carotid bifurcation without flow-limiting stenosis. Right ICA mildly tortuous but widely patent  distally to the skull base without stenosis or occlusion. Left common carotid artery tortuous proximally but widely patent to the bifurcation without stenosis. Mild atheromatous irregularity about the left bifurcation without flow-limiting stenosis. Left ICA tortuous but widely patent distally to the skull base without stenosis or occlusion. Left vertebral artery arises separately from the aortic arch. Vertebral arteries widely patent within the neck without stenosis or occlusion. IMPRESSION: MRI HEAD IMPRESSION: 1. Patchy small volume acute ischemic nonhemorrhagic infarct involving the left thalamus, left occipital lobe, and mesial left temporal lobe, left PCA territory. 2. Remote lacunar infarcts involving the right basal ganglia and left thalamus. 3. Moderate chronic small vessel ischemic disease. MRA HEAD IMPRESSION: 1. Extensive widespread intracranial atherosclerotic change throughout the intracranial circulation as above. Notable findings include moderate left M1 with severe left M2 stenoses, severe near occlusive right A2 stenosis, chronic right M2 occlusion, severe right V4 stenosis, and severe near occlusive bilateral P1 and P2 stenoses. 2. 4 mm right paraophthalmic aneurysm. MRA NECK IMPRESSION: Negative MRA of the neck. No high-grade or critical flow limiting stenosis within either carotid artery system. Vertebral arteries widely patent within the neck. Electronically Signed   By: Rise Mu M.D.   On: 02/24/2018 17:46   Dg Hips Bilat With Pelvis 2v  Result Date: 02/23/2018 CLINICAL DATA:  Bilateral hip pain EXAM: DG HIP (WITH OR WITHOUT PELVIS) 2V BILAT COMPARISON:  None. FINDINGS: No fracture or dislocation is seen. Bilateral hip joint spaces are symmetric. Visualized bony pelvis appears intact. Calcified fibroid overlying the left pelvis. Vascular calcifications. IMPRESSION: No acute osseus abnormality is seen. Electronically Signed   By: Charline Bills M.D.   On: 02/23/2018 19:57    2DEcho : Left ventricle: The cavity size was normal. There was severe   asymmetric hypertrophy. Systolic function was vigorous. The   estimated ejection fraction was in  the range of 65% to 70%. Wall   motion was normal; there were no regional wall motion   abnormalities LDL 187 mg% HbA1c 6.9 Physical Exam:    Pleasant elderly female currently not in distress. . Afebrile. Head is nontraumatic. Neck is supple without bruit.    Cardiac exam no murmur or gallop. Lungs are clear to auscultation. Distal pulses are well felt. Neurological Exam :  Awake alert oriented 2. Diminished attention, registration and recall. Poor short-term memory. Follows simple 1 step and occasional two-step commands. Extraocular moments are full range without nystagmus. Blinks to threat bilaterally. Vision acuity and fields seem adequate. Mild left lower facial asymmetry. Tongue midline. Motor system exam reveals no drift but mild weakness of Grip and intrinsic hand muscles.  Orbits right over left upper extremity. Symmetric lower extremity strength 4/5. Deep tendon reflexes are 2+ brisk bilaterally. Plantars are equivocal. Sensation appears intact. Coordination is slow but accurate bilaterally. Gait was not tested. ASSESSMENT Charlene Greene is a 84 y.o. female with  Transient slurred speech and left-sided weakness secondary to right brain TIA, on clopidogrel 75 mg daily for secondary stroke prevention, prior history of right basal ganglia infarct in November 2017.atchy left PCA embolic infarcts involving thalamus, occipital lobe and middle temporal lobe. MRA shows extensive  changes from intracranial atherososis.   Marland Kitchen  Hospital day # 2  TREATMENT/PLAN   Recommend dual antiplatelet therapy of aspirin and Plavix for 3   months followed by aspirin alone  And aggressive risk factor management with strict control of cholesterol with LDL cholesterol goal below 70 mg percent and hemoglobin A1c goal below 6.5% and blood pressure  goal below 130/90.May consider the new PC SK 9 inhibitor injections in case of statin intolerance or allergy.Family not available at the bedside for discussion. Discussed with Dr. Mahala Menghini.Therapy consults. Mobilize out of bed.ransfer for rehabilitation to skilled nursing facility when bed available. Stroke team will sign off. Follow-up as an outpatient in stroke clinic in 6 weeks. Kindly call for questions of any. Greater than 50% time during this 25 minute visit was spent on counseling and coordination of care about her TIA and previous stroke and answering questions.  Delia Heady, MD [facility] Pager: [phone] 02/25/2018 2:00 PM

## 2018-02-25 NOTE — Progress Notes (Addendum)
Physical Therapy Treatment Patient Details Name: Charlene Greene MRN: [id number] DOB: 04/21/1922 Today's Date: 02/25/2018    History of Present Illness Pt is a 84 y.o. female with PMH significant for CVA in 2017, DM type 2, HTN, sick sinus syndrome, s/p pacemaker placement, and diastolic CHF. She presented to the ED from assisted living facility with L sided weakness, slurred speech, and facial droop.    PT Comments    Upon arrival pt eating lunch unsupervised in bed and RN notified as pt is to be fully supervised for meals per ST orders. Pt pleasant and agreeable to therapy. Pt is making progress toward PT goals and tolerated short distance in room with mod A with RW and +2 for safety as pt fatigues quickly. Continue to progress as tolerated with anticipated d/c to SNF for further skilled PT services.    Follow Up Recommendations  SNF     Equipment Recommendations  Other (comment)(TBD next venue)    Recommendations for Other Services       Precautions / Restrictions Precautions Precautions: Fall Precaution Comments: L sided weakness, instability Restrictions Weight Bearing Restrictions: No    Mobility  Bed Mobility Overal bed mobility: Needs Assistance Bed Mobility: Supine to Sit     Supine to sit: Min guard;HOB elevated     General bed mobility comments: cues for sequencing and use of rail; min guard for safety  Transfers Overall transfer level: Needs assistance Equipment used: Rolling walker (2 wheeled) Transfers: Sit to/from Stand Sit to Stand: Mod assist;Min assist         General transfer comment: cues for safe hand placement; assist to power up into standing with mod A from EOB and min A from recliner and BSC  Ambulation/Gait Ambulation/Gait assistance: Mod assist;+2 safety/equipment(chair follow) Ambulation Distance (Feet): (796ft X2) Assistive device: Rolling walker (2 wheeled) Gait Pattern/deviations: Step-to pattern;Decreased step length - left;Decreased  dorsiflexion - left;Trunk flexed;Ataxic     General Gait Details: assist for balance, weight shifting, and safe management of RW; seated rest break required due to fatigue; pt sat abruptly when chair pulled up behind her; after rest pt ambulated another ~636ft however limited by incontinence of stool and BSC brought up behind pt   Stairs             Wheelchair Mobility    Modified Rankin (Stroke Patients Only)       Balance Overall balance assessment: Needs assistance Sitting-balance support: Feet supported;Bilateral upper extremity supported Sitting balance-Leahy Scale: Fair     Standing balance support: During functional activity;Bilateral upper extremity supported Standing balance-Leahy Scale: Poor                              Cognition Arousal/Alertness: Awake/alert Behavior During Therapy: WFL for tasks assessed/performed Overall Cognitive Status: Impaired/Different from baseline Area of Impairment: Orientation;Memory;Following commands;Safety/judgement;Awareness;Problem solving;Attention                 Orientation Level: Disoriented to;Time Current Attention Level: Sustained Memory: Decreased short-term memory Following Commands: Follows one step commands consistently;Follows one step commands with increased time Safety/Judgement: Decreased awareness of safety;Decreased awareness of deficits Awareness: Emergent Problem Solving: Slow processing;Requires verbal cues;Decreased initiation;Difficulty sequencing General Comments: poor historian and responded "I can't remember" to a lot of questions asked; pt incontinent of stool while ambulating and when asked if she felt the urge to have a BM pt replied "I can't remember"      Exercises  General Comments        Pertinent Vitals/Pain Pain Assessment: No/denies pain    Home Living                      Prior Function            PT Goals (current goals can now be found in the  care plan section) Acute Rehab PT Goals Patient Stated Goal: to walk PT Goal Formulation: With patient/family Time For Goal Achievement: 03/09/18 Potential to Achieve Goals: Good Progress towards PT goals: Progressing toward goals    Frequency    Min 3X/week      PT Plan Current plan remains appropriate    Co-evaluation              AM-PAC PT "6 Clicks" Daily Activity  Outcome Measure  Difficulty turning over in bed (including adjusting bedclothes, sheets and blankets)?: A Lot Difficulty moving from lying on back to sitting on the side of the bed? : Unable Difficulty sitting down on and standing up from a chair with arms (e.g., wheelchair, bedside commode, etc,.)?: Unable Help needed moving to and from a bed to chair (including a wheelchair)?: A Little Help needed walking in hospital room?: A Lot Help needed climbing 3-5 steps with a railing? : Total 6 Click Score: 10    End of Session Equipment Utilized During Treatment: Gait belt Activity Tolerance: Patient tolerated treatment well Patient left: with call bell/phone within reach;in chair;with chair alarm set Nurse Communication: Mobility status PT Visit Diagnosis: Other abnormalities of gait and mobility (R26.89);Difficulty in walking, not elsewhere classified (R26.2);Muscle weakness (generalized) (M62.81)     Time: 1610-9604 PT Time Calculation (min) (ACUTE ONLY): 27 min  Charges:  $Gait Training: 8-22 mins $Therapeutic Activity: 8-22 mins                    G Codes:       Charlene Greene, PTA Pager: [phone]     Carolynne Edouard 02/25/2018, 1:55 PM

## 2018-02-25 NOTE — Progress Notes (Signed)
  Speech Language Pathology Treatment: Dysphagia  Patient Details Name: Charlene Greene MRN: [id number] DOB: [birth year] Today's Date: 02/25/2018 Time: [id number] SLP Time Calculation (min) (ACUTE ONLY): 15 min  Assessment / Plan / Recommendation Clinical Impression  Pt was seen for skilled ST targeting family education following MBS this morning.  Pt's son was present and SLP discussed findings of MBS and recommendations that pt remain on current diet with trials of thin liquids in between meals per water protocol.  SLP also specifically emphasized utilizing small sips followed by second swallow to maximize airway protection during trials of advanced liquids.  All questions were answered to pt's and son's satisfaction at this time.  Continue per current plan of care.    HPI HPI: Charlene Griffinsell [place] is a 84 y.o. female with medical history significant of CVA 2017, DM 2, HTN, sick sinus syndrome, sp pacemaker, diastolic CHF presented with left side weakness, slurred speech and facial droop similar to prior presentation of CVA in 2107. Per chart pt reports 1 week of cough sometimes worse with eating. Patient had dysphasia with prior CVA but it resolved and she is on regular diet. CXR No edema or consolidation. Head CT No acute intracranial abnormality. BSE 09/2016 mild oropharyngeal dysphagia, reg/thin recommended.      SLP Plan  Continue with current plan of care       Recommendations  Diet recommendations: Dysphagia 3 (mechanical soft);Nectar-thick liquid Liquids provided via: Cup Medication Administration: Whole meds with puree Supervision: Patient able to self feed Compensations: Slow rate;Small sips/bites Postural Changes and/or Swallow Maneuvers: Seated upright 90 degrees                Follow up Recommendations: Skilled Nursing facility SLP Visit Diagnosis: Dysphagia, oropharyngeal phase (R13.12) Plan: Continue with current plan of care       GO                Ceejay Kegley, Melanee Spryicole  L 02/25/2018, 11:41 AM

## 2018-02-25 NOTE — Progress Notes (Signed)
Paged Donnamarie PoagK. Kirby about BP 177/74. Reference the previous note of gradual adding of BP meds. "HTN permissive hypertension-slow gradual lowering of the same 5-14 days-?  Amlodipine,?  Metoprolol"-Samtani, MD.

## 2018-02-25 NOTE — Progress Notes (Signed)
Hospitalist progress note   Charlene Greene  UEA:[id number] DOB: 1922-04-23 DOA: 02/22/2018 PCP: [facility]  Specialists:  Dr. Lorelei PontAsif Wahid of cardiology [place]   Brief Narrative:  [id number] female right basal ganglia infarct, SSS + PPM [place], HLD, DM TY 2+ neuropathy, hyponatremia, bipolar, HTN, reconstructive left breast surgery in the past?  Breast cancer, CAD status post CABG 2001--NST MI 04/2014-at that visit was discussed with patient to take Plavix but because of her anemia this was reconsidered and cardiology recommended medical management it also was felt at that time she would not tolerate a statin S/B extensive rehab stay 09/26/2016-10/11/2016  [facility]left-sided weakness, droop, slurred speech +1/52 H/o cough, no fever no chills + dysphagia?--Supposed to be on dysphagia diet previously  Assessment & Plan:   Likely TIA-CT scan head no specific symptoms --neurology input appreciated--last noted to be on aspirin 325 only pharmacist reviewed meds and is not on aspirin anymore either-likely etiology is underlying A. fib-pacemaker interrogated by electrophysiologist and found to be without A. Fib  anticoagulation will have to be balanced with her risk of increasing falls recently Reports for stroke workup  Cough probably secondary to dysphagia-getting 2 view chest x-ray-hold antibiotics until that time as no current fever  Bilateral hip pain-getting x-rays hips-shows no fracture as of 4/14  Supposed to be on dysphagia diet but eating regular apparently? Does not like dysphagia diet but is willing to try it No chest pain and no further cough  lipidemia LDL 187 total cholesterol [id number] HDL 39-needs high-dose intensity statin 80 mg Lipitor versus 20 Crestor on DC  Seborrheic keratosis + multiple dark lesions over back-we will need outpatient dermatology referral for possible biopsy  AVN left humerus-occupational therapy to see evaluate and  help with management--need skilled care on discharge  Stage I CKD BUN/creatinine 27/1.2-monitor.  Prior to admission meds include Lasix 10 daily, lisinopril 40 twice daily which has been held--- [facility]  Repeat basic metabolic panel a.m.  HTN permissive hypertension-slow gradual lowering of the same 5-14 days-?  Amlodipine,?  Metoprolol  Diabetes mellitus type 2 complication neuropathy/nephropathy-get A1c   SCD, inpatient pending workup,-will probably need skilled care   Consultants:   None yet  Procedures:   CT scan  Antimicrobials:   None  Subjective:  Awake alert tolerating some diet however per her son at the bedside she is not her usual fluent self In no distress currently No chest pain No focal motor deficit Mentation however is slower than usual once again  Objective: Vitals:   02/25/18 0421 02/25/18 0726 02/25/18 1026 02/25/18 1151  BP: (!) 177/74 (!) 164/64 (!) 172/71 (!) 179/67  Pulse: (!) 59 (!) 58 60 61  Resp: 20 17  16   Temp:  97.7 F (36.5 C)  97.7 F (36.5 C)  TempSrc:  Oral  Oral  SpO2: 95% 92%  99%  Weight:      Height:        Intake/Output Summary (Last 24 hours) at 02/25/2018 1745 Last data filed at 02/25/2018 [id number] Gross per 24 hour  Intake 240 ml  Output 400 ml  Net -160 ml   Filed Weights   02/22/18 1316 02/22/18 2148  Weight: 82.1 kg (181 lb) 75.9 kg (167 lb 5.3 oz)    Examination:  Pleasant smile symmetric extraocular movements intact no pallor no icterus vision by direct confrontation is normal S1-S2 no murmur rub or gallop Chest is clear Abdomen soft nontender  no rebound no guarding Power 5/5 Reflexes  deferred, sensory deferred as no change from prior the past several days  Data Reviewed: I have personally reviewed following labs and imaging studies  CBC: Recent Labs  Lab 02/22/18 1318 02/22/18 1355  WBC 8.0  --   NEUTROABS 5.5  --   HGB 13.5 13.6  HCT 40.6 40.0  MCV 88.5  --   PLT  272  --    Basic Metabolic Panel: Recent Labs  Lab 02/22/18 1318 02/22/18 1355  NA 136 138  K 4.2 4.3  CL 99* 100*  CO2 27  --   GLUCOSE 176* 173*  BUN 24* 27*  CREATININE 1.11* 1.20*  CALCIUM 9.1  --    GFR: Estimated Creatinine Clearance: 29.9 mL/min (A) (by C-G formula based on SCr of 1.2 mg/dL (H)). Liver Function Tests: Recent Labs  Lab 02/22/18 1318  AST 23  ALT 19  ALKPHOS 50  BILITOT 0.7  PROT 6.1*  ALBUMIN 3.8   No results for input(s): LIPASE, AMYLASE in the last 168 hours. No results for input(s): AMMONIA in the last 168 hours. Coagulation Profile: Recent Labs  Lab 02/22/18 1318  INR 1.00   Cardiac Enzymes:  Radiology Studies: Reviewed images personally in health database   Scheduled Meds: . amLODipine  5 mg Oral BID  . aspirin EC  81 mg Oral Daily  . clopidogrel  75 mg Oral Daily  . ezetimibe  10 mg Oral Daily  . guaiFENesin  600 mg Oral BID  . insulin aspart  0-5 Units Subcutaneous QHS  . insulin aspart  0-9 Units Subcutaneous TID WC  . metoprolol succinate  100 mg Oral Daily   Continuous Infusions: . sodium chloride Stopped (02/23/18 1126)  . sodium chloride       LOS: 2 days    Time spent: 68  Pleas Koch, MD [facility]  If 7PM-7AM, please contact night-coverage [email or web address] Password [facility] 02/25/2018, 5:45 PM

## 2018-02-26 ENCOUNTER — Other Ambulatory Visit: Payer: Self-pay

## 2018-02-26 ENCOUNTER — Encounter (HOSPITAL_COMMUNITY): Payer: Self-pay

## 2018-02-26 ENCOUNTER — Inpatient Hospital Stay (HOSPITAL_COMMUNITY)
Admission: RE | Admit: 2018-02-26 | Discharge: 2018-03-13 | DRG: 057 | Disposition: A | Discharge status: Home Health | Payer: Medicare Other | Source: Intra-hospital | Attending: Physical Medicine & Rehabilitation | Admitting: Physical Medicine & Rehabilitation

## 2018-02-26 DIAGNOSIS — E8809 Other disorders of plasma-protein metabolism, not elsewhere classified: Secondary | ICD-10-CM

## 2018-02-26 DIAGNOSIS — I639 Cerebral infarction, unspecified: Secondary | ICD-10-CM | POA: Diagnosis present

## 2018-02-26 DIAGNOSIS — M79671 Pain in right foot: Secondary | ICD-10-CM | POA: Diagnosis not present

## 2018-02-26 DIAGNOSIS — I2510 Atherosclerotic heart disease of native coronary artery without angina pectoris: Secondary | ICD-10-CM | POA: Diagnosis present

## 2018-02-26 DIAGNOSIS — I110 Hypertensive heart disease with heart failure: Secondary | ICD-10-CM | POA: Diagnosis present

## 2018-02-26 DIAGNOSIS — Z950 Presence of cardiac pacemaker: Secondary | ICD-10-CM | POA: Diagnosis not present

## 2018-02-26 DIAGNOSIS — I6381 Other cerebral infarction due to occlusion or stenosis of small artery: Secondary | ICD-10-CM

## 2018-02-26 DIAGNOSIS — E119 Type 2 diabetes mellitus without complications: Secondary | ICD-10-CM

## 2018-02-26 DIAGNOSIS — E1142 Type 2 diabetes mellitus with diabetic polyneuropathy: Secondary | ICD-10-CM | POA: Diagnosis present

## 2018-02-26 DIAGNOSIS — R52 Pain, unspecified: Secondary | ICD-10-CM

## 2018-02-26 DIAGNOSIS — R0989 Other specified symptoms and signs involving the circulatory and respiratory systems: Secondary | ICD-10-CM

## 2018-02-26 DIAGNOSIS — R269 Unspecified abnormalities of gait and mobility: Secondary | ICD-10-CM | POA: Diagnosis present

## 2018-02-26 DIAGNOSIS — E1143 Type 2 diabetes mellitus with diabetic autonomic (poly)neuropathy: Secondary | ICD-10-CM

## 2018-02-26 DIAGNOSIS — E785 Hyperlipidemia, unspecified: Secondary | ICD-10-CM | POA: Diagnosis present

## 2018-02-26 DIAGNOSIS — Z801 Family history of malignant neoplasm of trachea, bronchus and lung: Secondary | ICD-10-CM

## 2018-02-26 DIAGNOSIS — I5032 Chronic diastolic (congestive) heart failure: Secondary | ICD-10-CM

## 2018-02-26 DIAGNOSIS — I69391 Dysphagia following cerebral infarction: Secondary | ICD-10-CM

## 2018-02-26 DIAGNOSIS — I495 Sick sinus syndrome: Secondary | ICD-10-CM | POA: Diagnosis present

## 2018-02-26 DIAGNOSIS — Z794 Long term (current) use of insulin: Secondary | ICD-10-CM

## 2018-02-26 DIAGNOSIS — I10 Essential (primary) hypertension: Secondary | ICD-10-CM | POA: Diagnosis not present

## 2018-02-26 DIAGNOSIS — R799 Abnormal finding of blood chemistry, unspecified: Secondary | ICD-10-CM

## 2018-02-26 DIAGNOSIS — R1310 Dysphagia, unspecified: Secondary | ICD-10-CM | POA: Diagnosis present

## 2018-02-26 DIAGNOSIS — E46 Unspecified protein-calorie malnutrition: Secondary | ICD-10-CM

## 2018-02-26 DIAGNOSIS — Z7982 Long term (current) use of aspirin: Secondary | ICD-10-CM

## 2018-02-26 DIAGNOSIS — M7989 Other specified soft tissue disorders: Secondary | ICD-10-CM | POA: Diagnosis not present

## 2018-02-26 DIAGNOSIS — I69321 Dysphasia following cerebral infarction: Secondary | ICD-10-CM | POA: Diagnosis not present

## 2018-02-26 DIAGNOSIS — M79609 Pain in unspecified limb: Secondary | ICD-10-CM | POA: Diagnosis not present

## 2018-02-26 DIAGNOSIS — I69398 Other sequelae of cerebral infarction: Secondary | ICD-10-CM

## 2018-02-26 DIAGNOSIS — E1151 Type 2 diabetes mellitus with diabetic peripheral angiopathy without gangrene: Secondary | ICD-10-CM | POA: Diagnosis present

## 2018-02-26 DIAGNOSIS — I169 Hypertensive crisis, unspecified: Secondary | ICD-10-CM | POA: Diagnosis not present

## 2018-02-26 DIAGNOSIS — Z7902 Long term (current) use of antithrombotics/antiplatelets: Secondary | ICD-10-CM

## 2018-02-26 DIAGNOSIS — N183 Chronic kidney disease, stage 3 (moderate): Secondary | ICD-10-CM

## 2018-02-26 DIAGNOSIS — I739 Peripheral vascular disease, unspecified: Secondary | ICD-10-CM

## 2018-02-26 DIAGNOSIS — S90121S Contusion of right lesser toe(s) without damage to nail, sequela: Secondary | ICD-10-CM | POA: Diagnosis not present

## 2018-02-26 DIAGNOSIS — G479 Sleep disorder, unspecified: Secondary | ICD-10-CM | POA: Diagnosis present

## 2018-02-26 DIAGNOSIS — I5033 Acute on chronic diastolic (congestive) heart failure: Secondary | ICD-10-CM

## 2018-02-26 DIAGNOSIS — E1149 Type 2 diabetes mellitus with other diabetic neurological complication: Secondary | ICD-10-CM

## 2018-02-26 LAB — BASIC METABOLIC PANEL
Anion gap: 9 (ref 5–15)
BUN: 16 mg/dL (ref 6–20)
CALCIUM: 9.1 mg/dL (ref 8.9–10.3)
CHLORIDE: 103 mmol/L (ref 101–111)
CO2: 28 mmol/L (ref 22–32)
CREATININE: 0.83 mg/dL (ref 0.44–1.00)
GFR calc non Af Amer: 59 mL/min — ABNORMAL LOW (ref >60)
Glucose, Bld: 153 mg/dL — ABNORMAL HIGH (ref 65–99)
Potassium: 3.6 mmol/L (ref 3.5–5.1)
SODIUM: 140 mmol/L (ref 135–145)

## 2018-02-26 LAB — GLUCOSE, CAPILLARY
GLUCOSE-CAPILLARY: 137 mg/dL — AB (ref 65–99)
GLUCOSE-CAPILLARY: 190 mg/dL — AB (ref 65–99)
GLUCOSE-CAPILLARY: 173 mg/dL — AB (ref 65–99)
Glucose-Capillary: 214 mg/dL — ABNORMAL HIGH (ref 65–99)
Glucose-Capillary: 169 mg/dL — ABNORMAL HIGH (ref 65–99)

## 2018-02-26 MED ORDER — RESOURCE THICKENUP CLEAR PO POWD: ORAL | Status: DC

## 2018-02-26 MED ORDER — ACETAMINOPHEN 650 MG RE SUPP: 650.0000 mg | RECTAL | Status: DC | PRN

## 2018-02-26 MED ORDER — SORBITOL 70 % SOLN: 30.0000 mL | Freq: Every day | Status: DC | PRN

## 2018-02-26 MED ORDER — INSULIN ASPART 100 UNIT/ML ~~LOC~~ SOLN
0.0000 [IU] | Freq: Every day | SUBCUTANEOUS | Status: DC
  Administered 2018-02-27: 3 [IU] via SUBCUTANEOUS

## 2018-02-26 MED ORDER — ACETAMINOPHEN 160 MG/5ML PO SOLN
650.0000 mg | ORAL | Status: DC | PRN
  Administered 2018-03-08 – 2018-03-09 (×2): 650 mg
  Filled 2018-02-26 (×2): qty 20.3

## 2018-02-26 MED ORDER — SENNOSIDES-DOCUSATE SODIUM 8.6-50 MG PO TABS: 1.0000 | ORAL_TABLET | Freq: Every evening | ORAL | Status: DC | PRN

## 2018-02-26 MED ORDER — CLOPIDOGREL BISULFATE 75 MG PO TABS
75.0000 mg | ORAL_TABLET | Freq: Every day | ORAL | Status: DC
  Administered 2018-02-27 – 2018-03-13 (×15): 75 mg via ORAL
  Filled 2018-02-26 (×15): qty 1

## 2018-02-26 MED ORDER — ONDANSETRON HCL 4 MG/2ML IJ SOLN: 4.0000 mg | Freq: Four times a day (QID) | INTRAMUSCULAR | Status: DC | PRN

## 2018-02-26 MED ORDER — CLOPIDOGREL BISULFATE 75 MG PO TABS: 75.0000 mg | ORAL_TABLET | Freq: Every day | ORAL | 0 refills | Status: DC

## 2018-02-26 MED ORDER — AMLODIPINE BESYLATE 5 MG PO TABS
5.0000 mg | ORAL_TABLET | Freq: Two times a day (BID) | ORAL | Status: DC
  Administered 2018-02-26 – 2018-03-13 (×30): 5 mg via ORAL
  Filled 2018-02-26 (×30): qty 1

## 2018-02-26 MED ORDER — ACETAMINOPHEN 325 MG PO TABS
650.0000 mg | ORAL_TABLET | ORAL | Status: DC | PRN
  Administered 2018-02-26 – 2018-03-10 (×12): 650 mg via ORAL
  Filled 2018-02-26 (×12): qty 2

## 2018-02-26 MED ORDER — ONDANSETRON HCL 4 MG PO TABS: 4.0000 mg | ORAL_TABLET | Freq: Four times a day (QID) | ORAL | Status: DC | PRN

## 2018-02-26 MED ORDER — EZETIMIBE 10 MG PO TABS
10.0000 mg | ORAL_TABLET | Freq: Every day | ORAL | Status: DC
  Administered 2018-02-27 – 2018-03-13 (×15): 10 mg via ORAL
  Filled 2018-02-26 (×15): qty 1

## 2018-02-26 MED ORDER — ASPIRIN EC 81 MG PO TBEC
81.0000 mg | DELAYED_RELEASE_TABLET | Freq: Every day | ORAL | Status: DC
  Administered 2018-02-27 – 2018-03-13 (×15): 81 mg via ORAL
  Filled 2018-02-26 (×15): qty 1

## 2018-02-26 MED ORDER — METOPROLOL SUCCINATE ER 50 MG PO TB24
100.0000 mg | ORAL_TABLET | Freq: Every day | ORAL | Status: DC
  Administered 2018-02-27 – 2018-03-13 (×15): 100 mg via ORAL
  Filled 2018-02-26 (×16): qty 2

## 2018-02-26 MED ORDER — GUAIFENESIN ER 600 MG PO TB12
600.0000 mg | ORAL_TABLET | Freq: Two times a day (BID) | ORAL | Status: DC
  Administered 2018-02-26 – 2018-03-13 (×30): 600 mg via ORAL
  Filled 2018-02-26 (×30): qty 1

## 2018-02-26 MED ORDER — ALBUTEROL SULFATE (2.5 MG/3ML) 0.083% IN NEBU: 2.5000 mg | INHALATION_SOLUTION | RESPIRATORY_TRACT | Status: DC | PRN

## 2018-02-26 MED ORDER — ASPIRIN 81 MG PO TBEC: 81.0000 mg | DELAYED_RELEASE_TABLET | Freq: Every day | ORAL | 0 refills | Status: DC

## 2018-02-26 MED ORDER — RESOURCE THICKENUP CLEAR PO POWD
ORAL | Status: DC | PRN
  Filled 2018-02-26: qty 125

## 2018-02-26 NOTE — Discharge Summary (Signed)
Physician Discharge Summary  Charlene Greene ZOX:[id number] DOB: October 08, 1922 DOA: 02/22/2018  PCP: Almetta Lov[facility], [facility]  Admit date: 02/22/2018 Discharge date: 02/26/2018  Time spent: 45 minutes  Recommendations for Outpatient Follow-up:  Patient will be discharged to inpatient rehabilitation.  Continue physical, occupational, speech therapy.  Patient will need to follow up with primary care provider within one week of discharge.  Follow up with Dr. Pearlean Brownie, neurologist, in 6 weeks.  Patient should continue medications as prescribed.  Patient should follow a dysphagia 3 diet with nectar thick liquid.  Discharge Diagnoses:  Acute CVA Diabetes mellitus, type II Essential hypertension History of AVN of the left humerus Bilateral hip pain Seborrheic keratosis Hyperlipidemia  Discharge Condition: Stable  Diet recommendation: dysphagia 3 diet with nectar thick liquid  Code status: DNR  Filed Weights   02/22/18 1316 02/22/18 2148  Weight: 82.1 kg (181 lb) 75.9 kg (167 lb 5.3 oz)    History of present illness:  On 02/22/2018 by Dr. Therisa Doyne Mercedez Boule is a 84 y.o. female with medical history significant of CVA 2017, DM 2, HTN, sick sinus syndrome, sp pacemaker, diastolic CHF   Presented with   Left side weakness, slurred speech and facial droop similar to prior presentation of CVA in 2107 Last seen normal 5 PM last night Found today left side weakness and droopy face and slurred speech. The weakness and facial droop improved, Reports 1 week of cough but no fever or chills no wheezing. Sometimes worse with eating. Patient had dysphasia with prior CVA but it resolved and she is on regular diet.  Hx of sick sinus syndrome sp pacemaker.  Discussed plan of care with family given advanced age and inability to get MRI unlikely to benefit from extensive CVA workup.  Instead will concentrate on rehydration and physical therapy  Hospital Course:  Acute CVA -Presented with  transient slurred speech and left-sided weakness -CT head: No acute or cranial abnormality -MRI brain shows patchy small volume acute ischemic nonhemorrhagic infarct involving left thalamus, left occipital lobe, mesial left temporal lobe, left PCA territory -MRA of the brain shows widespread intracranial atherosclerotic changes. -MRA of the neck shows no significant extracranial stenosis -Echocardiogram EF 65-70%, no regional wall motion abnormalities -LDL 187, hemoglobin A1c 6.9 -Currently not on statin this patient has a statin intolerance-may want to consider PCSK9 inhibitor  -Neurology consulted and appreciated, recommended PC SK 9 inhibitor, aspirin and Plavix for 3 months then aspirin alone thereafter.  Follow-up in the clinic in 6 weeks. -Speech therapy recommended dysphagia 3 diet with nectar thick liquids -PT recommended SNF -Family requested CIR consult, CIR evaluated patient and feels she would be a good candidate  Diabetes mellitus, type II -A1c 6.9 -no longer taking amaryl  -Continue ISS   Essential hypertension -Given findings above, allow for permissive hypertension -Restart metoprolol and amlodipine slowly  History of AVN of the left humerus -Patient be discharged to inpatient rehab, will need to continue therapy as an outpatient  Bilateral hip pain -Xrays show no fracture -continue therapy  Seborrheic keratosis -follow up with PCP or dermatologist as an outpatient  Hyperlipidemia -Lipid panel: TC 292, HDL 39, LDL 187, TG 330 -Should consider PCSK9 inhibitor given statin intolerance -continue fenofibrate   Procedures: Echocardiogram  Consultations: Neurology  Inpatient rehab  Discharge Exam: Vitals:   02/26/18 0803 02/26/18 1146  BP: (!) 205/81 (!) 145/69  Pulse: 61 60  Resp: 20 17  Temp: 97.7 F (36.5 C) 97.8 F (36.6 C)  SpO2: 97% 96%  General: Well developed, elderly, NAD  HEENT: NCAT, mucous membranes moist.  Neck:  Supple  Cardiovascular: S1 S2 auscultated, no rubs, murmurs or gallops  Respiratory: Clear to auscultation bilaterally with equal chest rise  Abdomen: Soft, nontender, nondistended, + bowel sounds  Extremities: warm dry without cyanosis clubbing or edema  Neuro: AAOx3 (self, place, situation), nonfocal, decreased strength   Psych: Normal affect and demeanor with intact judgement and insight  Discharge Instructions Discharge Instructions    Discharge instructions   Complete by:  As directed    Patient will be discharged to inpatient rehabilitation.  Continue physical, occupational, speech therapy.  Patient will need to follow up with primary care provider within one week of discharge.  Follow up with Dr. Pearlean Brownie, neurologist, in 6 weeks.  Patient should continue medications as prescribed.  Patient should follow a dysphagia 3 diet with nectar thick liquid.     Allergies as of 02/26/2018      Reactions   Statins Other (See Comments)   Intol to numerous statins still feels has some muscle weakness, myalgia   Other Other (See Comments)   Unknown reaction to dye (listed on [facility] 02/22/18)   Amlodipine Other (See Comments)   unknown   Celecoxib Nausea Only, Other (See Comments)   Codeine Other (See Comments)   unknown   Naproxen Other (See Comments)   unknown   Piroxicam Other (See Comments)   unknown      Medication List    STOP taking these medications   aspirin 325 MG tablet Replaced by:  aspirin 81 MG EC tablet   furosemide 20 MG tablet Commonly known as:  LASIX   furosemide 40 MG tablet Commonly known as:  LASIX   glimepiride 2 MG tablet Commonly known as:  AMARYL   hydrALAZINE 25 MG tablet Commonly known as:  APRESOLINE   lisinopril 40 MG tablet Commonly known as:  PRINIVIL,ZESTRIL   niacin 250 MG tablet   potassium chloride SA 20 MEQ tablet Commonly known as:  K-DUR,KLOR-CON   pregabalin 75 MG capsule Commonly known as:  LYRICA   PRESERVISION AREDS 2  Caps   senna-docusate 8.6-50 MG tablet Commonly known as:  Senokot-S     TAKE these medications   acetaminophen 500 MG tablet Commonly known as:  TYLENOL Take 500 mg by mouth 2 (two) times daily.   amLODipine 5 MG tablet Commonly known as:  NORVASC Take 5 mg by mouth 2 (two) times daily. What changed:  Another medication with the same name was removed. Continue taking this medication, and follow the directions you see here.   aspirin 81 MG EC tablet Take 1 tablet (81 mg total) by mouth daily. Start taking on:  02/27/2018 Replaces:  aspirin 325 MG tablet   busPIRone 5 MG tablet Commonly known as:  BUSPAR Take 1 tablet (5 mg total) by mouth 2 (two) times daily.   cholecalciferol 1000 units tablet Commonly known as:  VITAMIN D Take 1,000 Units by mouth daily.   clopidogrel 75 MG tablet Commonly known as:  PLAVIX Take 1 tablet (75 mg total) by mouth daily. Start taking on:  02/27/2018   ezetimibe 10 MG tablet Commonly known as:  ZETIA Take 1 tablet (10 mg total) by mouth daily.   fenofibrate 160 MG tablet Take 1 tablet (160 mg total) by mouth daily.   gabapentin 100 MG capsule Commonly known as:  NEURONTIN Take 200 mg by mouth at bedtime.   insulin aspart 100 UNIT/ML injection Commonly known as:  novoLOG Inject 2-12 Units into the skin 4 (four) times daily as needed for high blood sugar (CBG >200). Per sliding scale: CBG 201-250 2 units, 251-300 4 units, 301-350 6 units, 351-400 8 units, 401-450 10 units, 451-500 12 units, >500 call 911   isosorbide mononitrate 60 MG 24 hr tablet Commonly known as:  IMDUR Take 1 tablet (60 mg total) by mouth daily.   Magnesium 250 MG Tabs Take 250 mg by mouth at bedtime.   metoprolol succinate 100 MG 24 hr tablet Commonly known as:  TOPROL-XL Take 1 tablet (100 mg total) by mouth daily.   nitroGLYCERIN 0.4 MG SL tablet Commonly known as:  NITROSTAT Place 0.4 mg under the tongue every 5 (five) minutes as needed for chest pain  (max 3 doses in 15 minutes).   psyllium 0.52 g capsule Commonly known as:  REGULOID Take 0.52 g by mouth at bedtime.   ranolazine 500 MG 12 hr tablet Commonly known as:  RANEXA Take 1 tablet (500 mg total) by mouth 2 (two) times daily.   RESOURCE THICKENUP CLEAR Powd Use to thicken liquids   senna 8.6 MG Tabs tablet Commonly known as:  SENOKOT Take 1 tablet by mouth daily as needed (constipation).   THROAT LOZENGES MT Use as directed 1 lozenge in the mouth or throat every 6 (six) hours as needed (sore throat (max 4 tabs/24 hours)).      Allergies  Allergen Reactions  . Statins Other (See Comments)    Intol to numerous statins still feels has some muscle weakness, myalgia  . Other Other (See Comments)    Unknown reaction to dye (listed on [facility] 02/22/18)  . Amlodipine Other (See Comments)    unknown  . Celecoxib Nausea Only and Other (See Comments)  . Codeine Other (See Comments)    unknown  . Naproxen Other (See Comments)    unknown  . Piroxicam Other (See Comments)    unknown   Follow-up Information    [facility]. Schedule an appointment as soon as possible for a visit in 1 week(s).   Specialty:  Geriatric Medicine Why:  Hospital follow up Contact information: [address] [place] [address] [phone]        Micki Riley, MD. Schedule an appointment as soon as possible for a visit in 6 week(s).   Specialties:  Neurology, Radiology Why:  Hosptial follow up, stroke clinic Contact information: [address] [place] [address] [phone]            The results of significant diagnostics from this hospitalization (including imaging, microbiology, ancillary and laboratory) are listed below for reference.    Significant Diagnostic Studies: Dg Chest 2 View  Result Date: 02/25/2018 CLINICAL DATA:  Cough and congestion EXAM: CHEST - 2 VIEW COMPARISON:  02/22/2018 FINDINGS: Left-sided pacing device as  before. No significant pleural effusion. Stable cardiomediastinal silhouette with aortic atherosclerosis. No pneumothorax. Small calcified loose bodies at the shoulders. IMPRESSION: No active cardiopulmonary disease.  Borderline to mild cardiomegaly. Electronically Signed   By: Jasmine Pang M.D.   On: 02/25/2018 19:59   Dg Chest 2 View  Result Date: 02/22/2018 CLINICAL DATA:  Left-sided weakness and slurred speech EXAM: CHEST - 2 VIEW COMPARISON:  September 28, 2016 FINDINGS: There is no edema or consolidation. Heart size and pulmonary vascularity are normal. Pacemaker leads are attached to the right atrium and right ventricle. There is aortic atherosclerosis. No adenopathy. There is degenerative change in both  shoulders. Remodeling of the left humeral head is indicative of chronic avascular necrosis. IMPRESSION: Pacemaker leads attached right atrium and right ventricle. Heart size normal. There is aortic atherosclerosis. No edema or consolidation. There is avascular necrosis in the left humeral head. Aortic Atherosclerosis (ICD10-I70.0). Electronically Signed   By: Bretta Bang III M.D.   On: 02/22/2018 16:02   Ct Head Wo Contrast  Result Date: 02/22/2018 CLINICAL DATA:  Slurred speech with left-sided facial droop and weakness. EXAM: CT HEAD WITHOUT CONTRAST TECHNIQUE: Contiguous axial images were obtained from the base of the skull through the vertex without intravenous contrast. COMPARISON:  CTA head and neck dated September 24, 2016. FINDINGS: Brain: No evidence of acute infarction, hemorrhage, hydrocephalus, extra-axial collection or mass lesion/mass effect. Old lacunar infarct in the right basal ganglia. Stable cerebral atrophy and chronic microvascular ischemic changes. Vascular: Calcified atherosclerosis at the skullbase. No hyperdense vessel. Skull: Negative for fracture or focal lesion. Sinuses/Orbits: No acute finding. Other: None. IMPRESSION: 1.  No acute intracranial abnormality.  Electronically Signed   By: Obie Dredge M.D.   On: 02/22/2018 14:14   Mr Maxine Glenn Neck W Wo Contrast  Result Date: 02/24/2018 CLINICAL DATA:  Initial evaluation for acute left-sided weakness, slurred speech, left facial droop. EXAM: MRI HEAD WITHOUT CONTRAST MRA HEAD WITHOUT CONTRAST MRA NECK WITHOUT CONTRAST TECHNIQUE: Multiplanar, multiecho pulse sequences of the brain and surrounding structures were obtained without intravenous contrast. Angiographic images of the Circle of Willis were obtained using MRA technique without intravenous contrast. Angiographic images of the neck were obtained using MRA technique without intravenous contrast. Carotid stenosis measurements (when applicable) are obtained utilizing NASCET criteria, using the distal internal carotid diameter as the denominator. COMPARISON:  Prior CT from 02/22/2018. FINDINGS: MRI HEAD FINDINGS Brain: Diffuse prominence of the CSF containing spaces is compatible with generalized age-related cerebral atrophy. Remote lacunar infarcts involving the right basal ganglia and left thalamus noted. Patchy T2/FLAIR hyperintensity involving the periventricular, deep, and subcortical white matter both cerebral hemispheres most consistent with chronic small vessel ischemic disease. Changes are overall moderate in nature. Patchy small volume diffusion abnormality seen involving the left thalamus/posterior limb of the left internal capsule, consistent with acute ischemic infarct (series 5001, image 61, 60, 59). Additional 5 mm acute ischemic cortical infarct present within the left occipital lobe (series 5001, image 57). Mild diffusion abnormality seen involving the mesial left temporal lobe/left hippocampus (series 5001, image 58). Changes primarily involve the left PCA territory. No associated hemorrhage or mass effect. No other evidence for acute or subacute ischemia. Gray-white matter differentiation otherwise maintained. No foci of susceptibility artifact to  suggest acute or chronic intracranial hemorrhage. No mass lesion, midline shift or mass effect. No hydrocephalus. No extra-axial fluid collection. Major dural sinuses grossly patent. Pituitary gland suprasellar region normal. Midline structures intact and normal. Vascular: Major intracranial vascular flow voids are maintained. Skull and upper cervical spine: Craniocervical junction normal. Upper cervical spine within normal limits. Bone marrow signal intensity normal. No scalp soft tissue abnormality. Sinuses/Orbits: Globes and orbital soft tissues within normal limits. Patient status post cataract extraction bilaterally. Paranasal sinuses are largely clear. No mastoid effusion. Inner ear structures grossly normal. Other: None. MRA HEAD FINDINGS ANTERIOR CIRCULATION: Distal cervical segments of the internal carotid arteries are patent with antegrade flow. Petrous segments widely patent bilaterally. Scattered atheromatous irregularity within the cavernous/supraclinoid ICAs bilaterally. Secondary moderate stenosis of approximately 50% at the supraclinoid right ICA (series 1001, image 100). No other significant flow-limiting stenosis. 4 mm right paraophthalmic  aneurysm noted (series 1001, image 88). ICA termini patent bilaterally. Atheromatous irregularity with moderate to severe stenoses present within the left A1 segment. Right A1 segment irregular but widely patent. Short-segment moderate to severe left A2 stenosis noted. There is severe near occlusive stenosis of the proximal right A2 segment (series 1001, image 129). Scant irregular flow seen distally within the right A2 segment. Extensive atheromatous irregularity throughout the left M1 segment with moderate proximal and mid left M1 stenoses. Short-segment severe proximal left M2 stenoses noted. Extensive atheromatous irregularity throughout the left MCA branches distally. Right M1 segment patent without stenosis or occlusion. There appears to be a proximal right  M2 occlusion, inferior division (series 1035001, image 12). Extensive atheromatous irregularity throughout the remainder of the right MCA branches which do remain patent. POSTERIOR CIRCULATION: Vertebral arteries are code dominant. Left vertebral artery patent without flow-limiting stenosis. Severe proximal right V4 stenosis noted. Posterior inferior cerebral arteries patent proximally. Basilar artery patent to its distal aspect without flow-limiting stenosis. Superior cerebral arteries patent bilaterally. Short-segment severe bilateral P1 stenoses noted. Prominent bilateral posterior communicating arteries noted. Severe proximal left P2 stenosis with near occlusion. Scant flow seen distally within the left PCA. Additional severe near occlusive distal right P2 stenosis. MRA NECK FINDINGS Examination somewhat technically limited by lack of IV contrast. Visualized aortic arch of normal caliber with normal branch pattern. No flow-limiting stenosis about the origin of the great vessels. Visualized subclavian arteries widely patent bilaterally. Right common carotid artery patent from its origin to the bifurcation without stenosis. Mild atheromatous irregularity about the right carotid bifurcation without flow-limiting stenosis. Right ICA mildly tortuous but widely patent distally to the skull base without stenosis or occlusion. Left common carotid artery tortuous proximally but widely patent to the bifurcation without stenosis. Mild atheromatous irregularity about the left bifurcation without flow-limiting stenosis. Left ICA tortuous but widely patent distally to the skull base without stenosis or occlusion. Left vertebral artery arises separately from the aortic arch. Vertebral arteries widely patent within the neck without stenosis or occlusion. IMPRESSION: MRI HEAD IMPRESSION: 1. Patchy small volume acute ischemic nonhemorrhagic infarct involving the left thalamus, left occipital lobe, and mesial left temporal lobe,  left PCA territory. 2. Remote lacunar infarcts involving the right basal ganglia and left thalamus. 3. Moderate chronic small vessel ischemic disease. MRA HEAD IMPRESSION: 1. Extensive widespread intracranial atherosclerotic change throughout the intracranial circulation as above. Notable findings include moderate left M1 with severe left M2 stenoses, severe near occlusive right A2 stenosis, chronic right M2 occlusion, severe right V4 stenosis, and severe near occlusive bilateral P1 and P2 stenoses. 2. 4 mm right paraophthalmic aneurysm. MRA NECK IMPRESSION: Negative MRA of the neck. No high-grade or critical flow limiting stenosis within either carotid artery system. Vertebral arteries widely patent within the neck. Electronically Signed   By: Rise Mu M.D.   On: 02/24/2018 17:46   Mr Brain Wo Contrast  Result Date: 02/24/2018 CLINICAL DATA:  Initial evaluation for acute left-sided weakness, slurred speech, left facial droop. EXAM: MRI HEAD WITHOUT CONTRAST MRA HEAD WITHOUT CONTRAST MRA NECK WITHOUT CONTRAST TECHNIQUE: Multiplanar, multiecho pulse sequences of the brain and surrounding structures were obtained without intravenous contrast. Angiographic images of the Circle of Willis were obtained using MRA technique without intravenous contrast. Angiographic images of the neck were obtained using MRA technique without intravenous contrast. Carotid stenosis measurements (when applicable) are obtained utilizing NASCET criteria, using the distal internal carotid diameter as the denominator. COMPARISON:  Prior CT from 02/22/2018. FINDINGS: MRI  HEAD FINDINGS Brain: Diffuse prominence of the CSF containing spaces is compatible with generalized age-related cerebral atrophy. Remote lacunar infarcts involving the right basal ganglia and left thalamus noted. Patchy T2/FLAIR hyperintensity involving the periventricular, deep, and subcortical white matter both cerebral hemispheres most consistent with chronic  small vessel ischemic disease. Changes are overall moderate in nature. Patchy small volume diffusion abnormality seen involving the left thalamus/posterior limb of the left internal capsule, consistent with acute ischemic infarct (series 5001, image 61, 60, 59). Additional 5 mm acute ischemic cortical infarct present within the left occipital lobe (series 5001, image 57). Mild diffusion abnormality seen involving the mesial left temporal lobe/left hippocampus (series 5001, image 58). Changes primarily involve the left PCA territory. No associated hemorrhage or mass effect. No other evidence for acute or subacute ischemia. Gray-white matter differentiation otherwise maintained. No foci of susceptibility artifact to suggest acute or chronic intracranial hemorrhage. No mass lesion, midline shift or mass effect. No hydrocephalus. No extra-axial fluid collection. Major dural sinuses grossly patent. Pituitary gland suprasellar region normal. Midline structures intact and normal. Vascular: Major intracranial vascular flow voids are maintained. Skull and upper cervical spine: Craniocervical junction normal. Upper cervical spine within normal limits. Bone marrow signal intensity normal. No scalp soft tissue abnormality. Sinuses/Orbits: Globes and orbital soft tissues within normal limits. Patient status post cataract extraction bilaterally. Paranasal sinuses are largely clear. No mastoid effusion. Inner ear structures grossly normal. Other: None. MRA HEAD FINDINGS ANTERIOR CIRCULATION: Distal cervical segments of the internal carotid arteries are patent with antegrade flow. Petrous segments widely patent bilaterally. Scattered atheromatous irregularity within the cavernous/supraclinoid ICAs bilaterally. Secondary moderate stenosis of approximately 50% at the supraclinoid right ICA (series 1001, image 100). No other significant flow-limiting stenosis. 4 mm right paraophthalmic aneurysm noted (series 1001, image 88). ICA  termini patent bilaterally. Atheromatous irregularity with moderate to severe stenoses present within the left A1 segment. Right A1 segment irregular but widely patent. Short-segment moderate to severe left A2 stenosis noted. There is severe near occlusive stenosis of the proximal right A2 segment (series 1001, image 129). Scant irregular flow seen distally within the right A2 segment. Extensive atheromatous irregularity throughout the left M1 segment with moderate proximal and mid left M1 stenoses. Short-segment severe proximal left M2 stenoses noted. Extensive atheromatous irregularity throughout the left MCA branches distally. Right M1 segment patent without stenosis or occlusion. There appears to be a proximal right M2 occlusion, inferior division (series 1035001, image 12). Extensive atheromatous irregularity throughout the remainder of the right MCA branches which do remain patent. POSTERIOR CIRCULATION: Vertebral arteries are code dominant. Left vertebral artery patent without flow-limiting stenosis. Severe proximal right V4 stenosis noted. Posterior inferior cerebral arteries patent proximally. Basilar artery patent to its distal aspect without flow-limiting stenosis. Superior cerebral arteries patent bilaterally. Short-segment severe bilateral P1 stenoses noted. Prominent bilateral posterior communicating arteries noted. Severe proximal left P2 stenosis with near occlusion. Scant flow seen distally within the left PCA. Additional severe near occlusive distal right P2 stenosis. MRA NECK FINDINGS Examination somewhat technically limited by lack of IV contrast. Visualized aortic arch of normal caliber with normal branch pattern. No flow-limiting stenosis about the origin of the great vessels. Visualized subclavian arteries widely patent bilaterally. Right common carotid artery patent from its origin to the bifurcation without stenosis. Mild atheromatous irregularity about the right carotid bifurcation without  flow-limiting stenosis. Right ICA mildly tortuous but widely patent distally to the skull base without stenosis or occlusion. Left common carotid artery tortuous proximally but widely  patent to the bifurcation without stenosis. Mild atheromatous irregularity about the left bifurcation without flow-limiting stenosis. Left ICA tortuous but widely patent distally to the skull base without stenosis or occlusion. Left vertebral artery arises separately from the aortic arch. Vertebral arteries widely patent within the neck without stenosis or occlusion. IMPRESSION: MRI HEAD IMPRESSION: 1. Patchy small volume acute ischemic nonhemorrhagic infarct involving the left thalamus, left occipital lobe, and mesial left temporal lobe, left PCA territory. 2. Remote lacunar infarcts involving the right basal ganglia and left thalamus. 3. Moderate chronic small vessel ischemic disease. MRA HEAD IMPRESSION: 1. Extensive widespread intracranial atherosclerotic change throughout the intracranial circulation as above. Notable findings include moderate left M1 with severe left M2 stenoses, severe near occlusive right A2 stenosis, chronic right M2 occlusion, severe right V4 stenosis, and severe near occlusive bilateral P1 and P2 stenoses. 2. 4 mm right paraophthalmic aneurysm. MRA NECK IMPRESSION: Negative MRA of the neck. No high-grade or critical flow limiting stenosis within either carotid artery system. Vertebral arteries widely patent within the neck. Electronically Signed   By: Rise Mu M.D.   On: 02/24/2018 17:46   Dg Swallowing Func-speech Pathology  Result Date: 02/25/2018 Objective Swallowing Evaluation: Type of Study: MBS-Modified Barium Swallow Study  Patient Details Name: Deniyah Dillavou MRN: [id number] Date of Birth: [birth year] Today's Date: 02/25/2018 Time: SLP Start Time (ACUTE ONLY): 0930 -SLP Stop Time (ACUTE ONLY): 0948 SLP Time Calculation (min) (ACUTE ONLY): 18 min Past Medical History: Past Medical History:  Diagnosis Date . CAD (coronary artery disease)  . Diabetes mellitus without complication (HCC)  . Hyperlipidemia  . Hypertension  . Pacemaker  . Renal artery stenosis (HCC)   s/p bilateral stents . Sick sinus syndrome (HCC)  . Stroke [facility])   2 previous cva's Past Surgical History: Past Surgical History: Procedure Laterality Date . PACEMAKER PLACEMENT  05/15/2014  MDT Advisa MRI implanted at [facility] for sick sinus syndrome HPI: Zeriah Baysinger is a 84 y.o. female with medical history significant of CVA 2017, DM 2, HTN, sick sinus syndrome, sp pacemaker, diastolic CHF presented with left side weakness, slurred speech and facial droop similar to prior presentation of CVA in 2107. Per chart pt reports 1 week of cough sometimes worse with eating. Patient had dysphasia with prior CVA but it resolved and she is on regular diet. CXR No edema or consolidation. Head CT No acute intracranial abnormality. BSE 09/2016 mild oropharyngeal dysphagia, reg/thin recommended.  Subjective: pt pleasant, cooperative Assessment / Plan / Recommendation CHL IP CLINICAL IMPRESSIONS 02/25/2018 Clinical Impression Pt presents with a mild oropharyngeal dysphagia.  Pt has decreased oral control of boluses in the setting of mild base of tongue weakness which leads to premature spillage and delay in swallow initiation to the level of the pyriforms.  This, in combination with decreased airway closure during the swallow, lead to penetration with nectar thick liquids (instance of deep penetration on initial sip which improved to flash penetration on subsequent boluses). Pt also had deep penetration with small controlled sips of thin liquids which worsened to aspiration when challenged to take large, consecutive boluses.  Furthermore, pt aspirated thin liquids when consuming mixed consistency boluses.  Aspiration of thins alone was trace in amount and went unsensed by pt; however, larger amounts of aspiration seen following mixed consistency boluses  elicited a strong cough response.  Mild pharyngeal residue was also evident throughout study, suspect due to a combination of decreased UES relaxation and base of tongue weakness.  Taking small sips followed by  a second swallow to clear residue appeared to be an effective technique for maximizing airway protection with all consistencies.  For now, recommend that pt remain on dys 3 textures and nectar thick liquids with full supervision for use of swallowing precautions.  Pt may have sips of water in between meals following oral care with full staff supervision per the water protocol.   SLP Visit Diagnosis Dysphagia, oropharyngeal phase (R13.12) Attention and concentration deficit following -- Frontal lobe and executive function deficit following -- Impact on safety and function Mild aspiration risk   CHL IP TREATMENT RECOMMENDATION 02/25/2018 Treatment Recommendations Therapy as outlined in treatment plan below   Prognosis 02/25/2018 Prognosis for Safe Diet Advancement Good Barriers to Reach Goals -- Barriers/Prognosis Comment -- CHL IP DIET RECOMMENDATION 02/25/2018 SLP Diet Recommendations Dysphagia 3 (Mech soft) solids;Nectar thick liquid Liquid Administration via Cup;Straw Medication Administration Crushed with puree Compensations Slow rate;Small sips/bites Postural Changes Seated upright at 90 degrees   CHL IP OTHER RECOMMENDATIONS 02/25/2018 Recommended Consults -- Oral Care Recommendations Oral care BID Other Recommendations --   CHL IP FOLLOW UP RECOMMENDATIONS 02/25/2018 Follow up Recommendations Skilled Nursing facility   [facility] IP FREQUENCY AND DURATION 02/25/2018 Speech Therapy Frequency (ACUTE ONLY) min 2x/week Treatment Duration --      CHL IP ORAL PHASE 02/25/2018 Oral Phase Impaired Oral - Pudding Teaspoon -- Oral - Pudding Cup -- Oral - Honey Teaspoon -- Oral - Honey Cup -- Oral - Nectar Teaspoon -- Oral - Nectar Cup Premature spillage;Decreased bolus cohesion Oral - Nectar Straw Premature spillage Oral -  Thin Teaspoon -- Oral - Thin Cup Premature spillage;Decreased bolus cohesion;Weak lingual manipulation Oral - Thin Straw Premature spillage Oral - Puree -- Oral - Mech Soft -- Oral - Regular -- Oral - Multi-Consistency Premature spillage;Decreased bolus cohesion Oral - Pill Premature spillage;Decreased bolus cohesion Oral Phase - Comment --  CHL IP PHARYNGEAL PHASE 02/25/2018 Pharyngeal Phase Impaired Pharyngeal- Pudding Teaspoon -- Pharyngeal -- Pharyngeal- Pudding Cup -- Pharyngeal -- Pharyngeal- Honey Teaspoon -- Pharyngeal -- Pharyngeal- Honey Cup -- Pharyngeal -- Pharyngeal- Nectar Teaspoon -- Pharyngeal -- Pharyngeal- Nectar Cup Reduced airway/laryngeal closure;Reduced tongue base retraction;Penetration/Aspiration during swallow;Delayed swallow initiation-pyriform sinuses;Pharyngeal residue - valleculae;Pharyngeal residue - pyriform Pharyngeal Material enters airway, CONTACTS cords and not ejected out Pharyngeal- Nectar Straw Delayed swallow initiation-pyriform sinuses;Penetration/Aspiration during swallow;Reduced airway/laryngeal closure;Reduced tongue base retraction;Pharyngeal residue - pyriform;Pharyngeal residue - valleculae Pharyngeal Material enters airway, remains ABOVE vocal cords then ejected out Pharyngeal- Thin Teaspoon -- Pharyngeal -- Pharyngeal- Thin Cup Reduced airway/laryngeal closure;Reduced tongue base retraction;Delayed swallow initiation-pyriform sinuses;Penetration/Aspiration during swallow Pharyngeal Material enters airway, CONTACTS cords and not ejected out Pharyngeal- Thin Straw Penetration/Aspiration during swallow;Reduced tongue base retraction;Delayed swallow initiation-pyriform sinuses;Reduced airway/laryngeal closure Pharyngeal Material enters airway, passes BELOW cords without attempt by patient to eject out (silent aspiration) Pharyngeal- Puree -- Pharyngeal -- Pharyngeal- Mechanical Soft -- Pharyngeal -- Pharyngeal- Regular -- Pharyngeal -- Pharyngeal- Multi-consistency Delayed  swallow initiation-vallecula;Reduced tongue base retraction;Reduced airway/laryngeal closure;Pharyngeal residue - pyriform;Pharyngeal residue - valleculae;Penetration/Aspiration during swallow Pharyngeal Material enters airway, passes BELOW cords and not ejected out despite cough attempt by patient Pharyngeal- Pill Delayed swallow initiation-vallecula;Reduced tongue base retraction Pharyngeal -- Pharyngeal Comment --  CHL IP CERVICAL ESOPHAGEAL PHASE 02/25/2018 Cervical Esophageal Phase Impaired Pudding Teaspoon -- Pudding Cup -- Honey Teaspoon -- Honey Cup -- Nectar Teaspoon -- Nectar Cup -- Nectar Straw -- Thin Teaspoon -- Thin Cup -- Thin Straw -- Puree -- Mechanical Soft -- Regular -- Multi-consistency -- Pill -- Cervical Esophageal Comment decreased  UES relaxation No flowsheet data found. Page, Melanee SpryNicole L 02/25/2018, 10:38 AM              Mr Maxine GlennMra Head Wo Contrast  Result Date: 02/24/2018 CLINICAL DATA:  Initial evaluation for acute left-sided weakness, slurred speech, left facial droop. EXAM: MRI HEAD WITHOUT CONTRAST MRA HEAD WITHOUT CONTRAST MRA NECK WITHOUT CONTRAST TECHNIQUE: Multiplanar, multiecho pulse sequences of the brain and surrounding structures were obtained without intravenous contrast. Angiographic images of the Circle of Willis were obtained using MRA technique without intravenous contrast. Angiographic images of the neck were obtained using MRA technique without intravenous contrast. Carotid stenosis measurements (when applicable) are obtained utilizing NASCET criteria, using the distal internal carotid diameter as the denominator. COMPARISON:  Prior CT from 02/22/2018. FINDINGS: MRI HEAD FINDINGS Brain: Diffuse prominence of the CSF containing spaces is compatible with generalized age-related cerebral atrophy. Remote lacunar infarcts involving the right basal ganglia and left thalamus noted. Patchy T2/FLAIR hyperintensity involving the periventricular, deep, and subcortical white matter both  cerebral hemispheres most consistent with chronic small vessel ischemic disease. Changes are overall moderate in nature. Patchy small volume diffusion abnormality seen involving the left thalamus/posterior limb of the left internal capsule, consistent with acute ischemic infarct (series 5001, image 61, 60, 59). Additional 5 mm acute ischemic cortical infarct present within the left occipital lobe (series 5001, image 57). Mild diffusion abnormality seen involving the mesial left temporal lobe/left hippocampus (series 5001, image 58). Changes primarily involve the left PCA territory. No associated hemorrhage or mass effect. No other evidence for acute or subacute ischemia. Gray-white matter differentiation otherwise maintained. No foci of susceptibility artifact to suggest acute or chronic intracranial hemorrhage. No mass lesion, midline shift or mass effect. No hydrocephalus. No extra-axial fluid collection. Major dural sinuses grossly patent. Pituitary gland suprasellar region normal. Midline structures intact and normal. Vascular: Major intracranial vascular flow voids are maintained. Skull and upper cervical spine: Craniocervical junction normal. Upper cervical spine within normal limits. Bone marrow signal intensity normal. No scalp soft tissue abnormality. Sinuses/Orbits: Globes and orbital soft tissues within normal limits. Patient status post cataract extraction bilaterally. Paranasal sinuses are largely clear. No mastoid effusion. Inner ear structures grossly normal. Other: None. MRA HEAD FINDINGS ANTERIOR CIRCULATION: Distal cervical segments of the internal carotid arteries are patent with antegrade flow. Petrous segments widely patent bilaterally. Scattered atheromatous irregularity within the cavernous/supraclinoid ICAs bilaterally. Secondary moderate stenosis of approximately 50% at the supraclinoid right ICA (series 1001, image 100). No other significant flow-limiting stenosis. 4 mm right paraophthalmic  aneurysm noted (series 1001, image 88). ICA termini patent bilaterally. Atheromatous irregularity with moderate to severe stenoses present within the left A1 segment. Right A1 segment irregular but widely patent. Short-segment moderate to severe left A2 stenosis noted. There is severe near occlusive stenosis of the proximal right A2 segment (series 1001, image 129). Scant irregular flow seen distally within the right A2 segment. Extensive atheromatous irregularity throughout the left M1 segment with moderate proximal and mid left M1 stenoses. Short-segment severe proximal left M2 stenoses noted. Extensive atheromatous irregularity throughout the left MCA branches distally. Right M1 segment patent without stenosis or occlusion. There appears to be a proximal right M2 occlusion, inferior division (series 1035001, image 12). Extensive atheromatous irregularity throughout the remainder of the right MCA branches which do remain patent. POSTERIOR CIRCULATION: Vertebral arteries are code dominant. Left vertebral artery patent without flow-limiting stenosis. Severe proximal right V4 stenosis noted. Posterior inferior cerebral arteries patent proximally. Basilar artery patent to its distal  aspect without flow-limiting stenosis. Superior cerebral arteries patent bilaterally. Short-segment severe bilateral P1 stenoses noted. Prominent bilateral posterior communicating arteries noted. Severe proximal left P2 stenosis with near occlusion. Scant flow seen distally within the left PCA. Additional severe near occlusive distal right P2 stenosis. MRA NECK FINDINGS Examination somewhat technically limited by lack of IV contrast. Visualized aortic arch of normal caliber with normal branch pattern. No flow-limiting stenosis about the origin of the great vessels. Visualized subclavian arteries widely patent bilaterally. Right common carotid artery patent from its origin to the bifurcation without stenosis. Mild atheromatous irregularity  about the right carotid bifurcation without flow-limiting stenosis. Right ICA mildly tortuous but widely patent distally to the skull base without stenosis or occlusion. Left common carotid artery tortuous proximally but widely patent to the bifurcation without stenosis. Mild atheromatous irregularity about the left bifurcation without flow-limiting stenosis. Left ICA tortuous but widely patent distally to the skull base without stenosis or occlusion. Left vertebral artery arises separately from the aortic arch. Vertebral arteries widely patent within the neck without stenosis or occlusion. IMPRESSION: MRI HEAD IMPRESSION: 1. Patchy small volume acute ischemic nonhemorrhagic infarct involving the left thalamus, left occipital lobe, and mesial left temporal lobe, left PCA territory. 2. Remote lacunar infarcts involving the right basal ganglia and left thalamus. 3. Moderate chronic small vessel ischemic disease. MRA HEAD IMPRESSION: 1. Extensive widespread intracranial atherosclerotic change throughout the intracranial circulation as above. Notable findings include moderate left M1 with severe left M2 stenoses, severe near occlusive right A2 stenosis, chronic right M2 occlusion, severe right V4 stenosis, and severe near occlusive bilateral P1 and P2 stenoses. 2. 4 mm right paraophthalmic aneurysm. MRA NECK IMPRESSION: Negative MRA of the neck. No high-grade or critical flow limiting stenosis within either carotid artery system. Vertebral arteries widely patent within the neck. Electronically Signed   By: Rise Mu M.D.   On: 02/24/2018 17:46   Dg Hips Bilat With Pelvis 2v  Result Date: 02/23/2018 CLINICAL DATA:  Bilateral hip pain EXAM: DG HIP (WITH OR WITHOUT PELVIS) 2V BILAT COMPARISON:  None. FINDINGS: No fracture or dislocation is seen. Bilateral hip joint spaces are symmetric. Visualized bony pelvis appears intact. Calcified fibroid overlying the left pelvis. Vascular calcifications. IMPRESSION: No  acute osseus abnormality is seen. Electronically Signed   By: Charline Bills M.D.   On: 02/23/2018 19:57    Microbiology: Recent Results (from the past 240 hour(s))  Urine Culture     Status: None   Collection Time: 02/22/18  8:00 PM  Result Value Ref Range Status   Specimen Description URINE, CLEAN CATCH  Final   Special Requests NONE  Final   Culture   Final    NO GROWTH Performed at [facility], [address], [place], [place] [address]    Report Status 02/24/2018 FINAL  Final  MRSA PCR Screening     Status: None   Collection Time: 02/22/18 11:19 PM  Result Value Ref Range Status   MRSA by PCR NEGATIVE NEGATIVE Final    Comment:        The GeneXpert MRSA Assay (FDA approved for NASAL specimens only), is one component of a comprehensive MRSA colonization surveillance program. It is not intended to diagnose MRSA infection nor to guide or monitor treatment for MRSA infections. Performed at [facility], [address], [place], [place] [address]      Labs: Basic Metabolic Panel: Recent Labs  Lab 02/22/18 1318 02/22/18 1355 02/26/18 0546  NA 136  138 140  K 4.2 4.3 3.6  CL 99* 100* 103  CO2 27  --  28  GLUCOSE 176* 173* 153*  BUN 24* 27* 16  CREATININE 1.11* 1.20* 0.83  CALCIUM 9.1  --  9.1   Liver Function Tests: Recent Labs  Lab 02/22/18 1318  AST 23  ALT 19  ALKPHOS 50  BILITOT 0.7  PROT 6.1*  ALBUMIN 3.8   No results for input(s): LIPASE, AMYLASE in the last 168 hours. No results for input(s): AMMONIA in the last 168 hours. CBC: Recent Labs  Lab 02/22/18 1318 02/22/18 1355  WBC 8.0  --   NEUTROABS 5.5  --   HGB 13.5 13.6  HCT 40.6 40.0  MCV 88.5  --   PLT 272  --    Cardiac Enzymes: Recent Labs  Lab 02/22/18 2253  TROPONINI <0.03   BNP: BNP (last 3 results) Recent Labs    02/22/18 1318  BNP 92.2    ProBNP (last 3 results) No results for input(s): PROBNP in the last 8760 hours.  CBG: Recent Labs  Lab  02/25/18 1613 02/25/18 2123 02/26/18 0606 02/26/18 1205 02/26/18 1211  GLUCAP 170* 218* 137* 214* 190*       Signed:  [name]  [facility] 02/26/2018, 2:36 PM

## 2018-02-26 NOTE — IPOC Note (Signed)
Overall Plan of Care [facility]) Patient Details Name: Charlene Greene MRN: [id number] DOB: Jun 13, 1922  Admitting Diagnosis: Left brain CVA   Hospital Problems: Active Problems:   Left thalamic infarction (HCC)   Hypoalbuminemia due to protein-calorie malnutrition (HCC)   Diabetic peripheral neuropathy (HCC)   Diabetes mellitus type 2 in nonobese (HCC)   Chronic diastolic congestive heart failure (HCC)     Functional Problem List: Nursing Skin Integrity, Bowel, Bladder, Nutrition(Incontinent at times of b/b MASD right groin NTL D3 diet)  PT Balance, Endurance, Motor, Safety, Skin Integrity  OT Balance, Cognition, Endurance, Motor, Pain, Safety  SLP Cognition, Nutrition  TR         Basic ADL's: OT Grooming, Bathing, Dressing, Toileting     Advanced  ADL's: OT       Transfers: PT Bed Mobility, Bed to Chair, Occupational psychologist, Tub/Shower     Locomotion: PT Ambulation     Additional Impairments: OT    SLP Swallowing, Social Cognition   Memory, Attention, Problem Solving  TR      Anticipated Outcomes Item Anticipated Outcome  Self Feeding modified independent  Swallowing  mod I    Basic self-care  min assist  Toileting  supervision   Bathroom Transfers supervision to min assist  Bowel/Bladder  Pt will be continent of B/B  Transfers  S  Locomotion  S with LRAD short distances in apartment  Communication     Cognition  Supervision   Pain  Pt will have pain <=2/10  Safety/Judgment  Pt will be free of falls    Therapy Plan: PT Intensity: Minimum of 1-2 x/day ,45 to 90 minutes PT Frequency: 5 out of 7 days PT Duration Estimated Length of Stay: 12-14 days OT Intensity: Minimum of 1-2 x/day, 45 to 90 minutes OT Frequency: 5 out of 7 days OT Duration/Estimated Length of Stay: 12-14 days SLP Intensity: Minumum of 1-2 x/day, 30 to 90 minutes SLP Frequency: 3 to 5 out of 7 days SLP Duration/Estimated Length of Stay: 14-17 days     Team  Interventions: Nursing Interventions Patient/Family Education, Skin Care/Wound Management, Bladder Management, Pain Management, Bowel Management, Dysphagia/Aspiration Precaution Training, Psychosocial Support  PT interventions Ambulation/gait training, Discharge planning, Functional mobility training, Psychosocial support, Therapeutic Activities, Therapeutic Exercise, Skin care/wound management, Neuromuscular re-education, Disease management/prevention, Warden/ranger, Wheelchair propulsion/positioning, UE/LE Strength taining/ROM, Pain management, DME/adaptive equipment instruction, Cognitive remediation/compensation, Firefighter, Equities trader education, UE/LE Coordination activities  OT Interventions Warden/ranger, Cognitive remediation/compensation, Firefighter, Fish farm manager, Neuromuscular re-education, UE/LE Strength taining/ROM, Therapeutic Exercise, Patient/family education, Functional mobility training, Discharge planning, Pain management, Self Care/advanced ADL retraining, Therapeutic Activities, UE/LE Coordination activities  SLP Interventions Cognitive remediation/compensation, Cueing hierarchy, Dysphagia/aspiration precaution training, Internal/external aids, Environmental controls, Patient/family education  TR Interventions    SW/CM Interventions Discharge Planning, Psychosocial Support, Patient/Family Education   Barriers to Discharge MD  Medical stability and Lack of/limited family support  Nursing (none pt from a nursing home)    PT      OT Decreased caregiver support Will need 24 hour assist with return to ALF, unsure if this can be provided or not.    SLP      SW       Team Discharge Planning: Destination: PT-Assisted Living ,OT- Assisted Living , SLP-Assisted Living Projected Follow-up: PT-Home health PT, OT-  24 hour supervision/assistance, Home health OT, SLP-Other (comment)(TBD) Projected Equipment  Needs: PT-To be determined, OT- To be determined, SLP-To be determined Equipment Details: PT-has rollator, OT-  Patient/family  involved in discharge planning: PT- Patient,  OT-Patient, SLP-Patient  MD ELOS: 12-14 days. Medical Rehab Prognosis:  Good Assessment: 84 year old right-handed female with history of CVA 2017 maintained on aspirin and Plavix, diabetes mellitus, hypertension, sick sinus syndrome with pacemaker, diastolic congestive heart failure.  Patient received inpatient rehab service November 2017 for right basal ganglia infarction secondary to small vessel disease.  She does receive some assistance with bathing and dressing.  Her meals are provided.  She uses a walker and a wheelchair at the assisted living facility.  Presented 02/22/2018 with altered mental status.  Cranial CT reviewed, unremarkable for acute intracranial process. MRI showed patchy small volume acute ischemic nonhemorrhagic infarction involving the left thalamus, left occipital lobe and left temporal lobe, left PCA territory.  MRA of the neck was negative.  MRI of the head with extensive atherosclerotic changes.  Severe near occlusion right A2 stenosis, right chronic M2 occlusion.  Echocardiogram with ejection fraction of 70% grade 1 diastolic dysfunction.  Neurology consulted presently on aspirin and Plavix therapy times 3 months then aspirin alone.  Cardiology follow-up pacemaker interrogated showed no signs of A. fib.  Presently on a dysphagia #3 nectar thick liquid diet.  Patient with complaints of hip pain x-rays unremarkable.  Patient with resulting functional deficits with mobility, endurance, self-care.  Will set goals for Supervision/Min A with most tasks for PT/OT/SLP.  See Team Conference Notes for weekly updates to the plan of care

## 2018-02-26 NOTE — Progress Notes (Addendum)
Physical Therapy Treatment Patient Details Name: Charlene Greene MRN: [id number] DOB: 11-Feb-1922 Today's Date: 02/26/2018    History of Present Illness Pt is a 84 y.o. female with PMH significant for CVA in 2017, DM type 2, HTN, sick sinus syndrome, s/p pacemaker placement, and diastolic CHF. She presented to the ED from assisted living facility with L sided weakness, slurred speech, and facial droop.    PT Comments    Patient continues to make gradual progress toward PT goals. Patient was able to ambulate 92ft X2 with RW and mod A (with close chair follow). Pt required min/mod A for functional transfers and continues to demonstrate cognitive deficits.  Son present throughout session. Pt prefers to return to [facility] upon d/c and this therapist discussed with son and pt the need for 24 hour supervision/assistance. Not sure if her ALF can provide this level of assistance as well as HH therapy services. Current plan remains appropriate.     Follow Up Recommendations  SNF     Equipment Recommendations  Other (comment)(TBD next venue)    Recommendations for Other Services       Precautions / Restrictions Precautions Precautions: Fall Precaution Comments: L sided weakness, instability    Mobility  Bed Mobility Overal bed mobility: Needs Assistance Bed Mobility: Supine to Sit     Supine to sit: Min guard;HOB elevated     General bed mobility comments: cues for sequencing and use of rail; min guard for safety  Transfers Overall transfer level: Needs assistance Equipment used: Rolling walker (2 wheeled) Transfers: Sit to/from Stand Sit to Stand: Mod assist;Min assist         General transfer comment: cues for hand placement and assistance to power up into standing and gain balance upon stand; from EOB and recliner; increased time and cues needed to transition L hand placement to RW  Ambulation/Gait Ambulation/Gait assistance: Mod assist;+2 safety/equipment(chair  follow) Ambulation Distance (Feet): (71ft X2) Assistive device: Rolling walker (2 wheeled) Gait Pattern/deviations: Step-to pattern;Decreased step length - left;Decreased dorsiflexion - left;Trunk flexed;Decreased stance time - left     General Gait Details: cues for sequencing and safe proximity to RW; assistance required for balance and weight shifting however pt able to manage RW more independently this session; L LE weakness and buckling noted with fatigue   Stairs             Wheelchair Mobility    Modified Rankin (Stroke Patients Only)       Balance Overall balance assessment: Needs assistance Sitting-balance support: Feet supported;Bilateral upper extremity supported Sitting balance-Leahy Scale: Fair     Standing balance support: During functional activity;Bilateral upper extremity supported Standing balance-Leahy Scale: Poor                              Cognition Arousal/Alertness: Awake/alert Behavior During Therapy: WFL for tasks assessed/performed Overall Cognitive Status: Impaired/Different from baseline Area of Impairment: Memory;Following commands;Safety/judgement;Awareness;Problem solving;Attention                   Current Attention Level: Sustained Memory: Decreased short-term memory Following Commands: Follows one step commands consistently;Follows one step commands with increased time Safety/Judgement: Decreased awareness of safety;Decreased awareness of deficits Awareness: Emergent Problem Solving: Slow processing;Requires verbal cues;Decreased initiation;Difficulty sequencing General Comments: son present during session; pt is not at baseline cognition per son and continue to have delayed responses       Exercises      General  Comments        Pertinent Vitals/Pain Pain Assessment: Faces Faces Pain Scale: Hurts little more Pain Location: bilat LE with weight bearing Pain Descriptors / Indicators:  Aching;Sore;Heaviness Pain Intervention(s): Limited activity within patient's tolerance;Monitored during session;Repositioned    Home Living                      Prior Function            PT Goals (current goals can now be found in the care plan section) Acute Rehab PT Goals PT Goal Formulation: With patient/family Time For Goal Achievement: 03/09/18 Potential to Achieve Goals: Good Progress towards PT goals: Progressing toward goals    Frequency    Min 3X/week      PT Plan Current plan remains appropriate    Co-evaluation              AM-PAC PT "6 Clicks" Daily Activity  Outcome Measure  Difficulty turning over in bed (including adjusting bedclothes, sheets and blankets)?: A Lot Difficulty moving from lying on back to sitting on the side of the bed? : Unable Difficulty sitting down on and standing up from a chair with arms (e.g., wheelchair, bedside commode, etc,.)?: Unable Help needed moving to and from a bed to chair (including a wheelchair)?: A Little Help needed walking in hospital room?: A Lot Help needed climbing 3-5 steps with a railing? : Total 6 Click Score: 10    End of Session Equipment Utilized During Treatment: Gait belt Activity Tolerance: Patient tolerated treatment well Patient left: with call bell/phone within reach;in chair;with chair alarm set;with family/visitor present Nurse Communication: Mobility status PT Visit Diagnosis: Other abnormalities of gait and mobility (R26.89);Difficulty in walking, not elsewhere classified (R26.2);Muscle weakness (generalized) (M62.81)     Time: [id number] PT Time Calculation (min) (ACUTE ONLY): 30 min  Charges:  $Gait Training: 8-22 mins $Therapeutic Activity: 8-22 mins                    G Codes:       Erline LevineKellyn Aayla Marrocco, PTA Pager: [phone]     Carolynne EdouardKellyn R Ryanne Morand 02/26/2018, 11:21 AM

## 2018-02-26 NOTE — Progress Notes (Signed)
Patient ID: Charlene Greene, female   DOB: 05/13/1922, 84 y.o.   MRN: [id number] Pt in bed SCD's applied. Reviewed safety plan and plan of care with pt. Pt's medications reviewed. Questions answered.

## 2018-02-26 NOTE — PMR Pre-admission (Signed)
PMR Admission Coordinator Pre-Admission Assessment  Patient: Charlene Greene is an 84 y.o., female MRN: [id number] DOB: 03/11/1922 Height: 5\' 5"  (165.1 cm) Weight: 75.9 kg (167 lb 5.3 oz)              Insurance Information HMO:     PPO:      PCP:      IPA:      80/20: yes     OTHER: no hmo PRIMARY: Medicare a and b      Policy#: [id number] d2/ family to provide updates Medicare number      Subscriber: pt Benefits:  Phone #: online  Date: 08/12/1989     Deduct: $7829      Out of Pocket Max: none      Life Max: none CIR: 100%      SNF: 20 full days Outpatient: 80%     Co-Pay: 20% Home Health: 100%      Co-Pay: none DME: 80%     Co-Pay: 20% Providers: pt choice  SECONDARY: GEHA      Policy#: [id number] geha      Subscriber: pt  Medicaid Application Date:       Case Manager:  Disability Application Date:       Case Worker:   Emergency Contact Information Contact Information    Name Relation Home Work Mobile   [place] Son   [phone]   Leidi, Astle   [phone]   Sleeper,Denise Other   [phone]     Current Medical History  Patient Admitting Diagnosis: left thalamic infarcts  History of Present Illness:  HPI: Charlene Greene is a 84 year old right-handed female with history of CVA 2017 maintained on aspirin and Plavix, diabetes mellitus, hypertension, sick sinus syndrome with pacemaker, diastolic congestive heart failure.  Patient received inpatient rehab service November 2017 for right basal ganglia infarction secondary to small vessel disease.   Presented 02/22/2018 with altered mental status.  Cranial CT scan negative.  MRI showed patchy small volume acute ischemic nonhemorrhagic infarction involving the left thalamus, left occipital lobe and left temporal lobe, left PCA territory.  MRA of the neck was negative.  MRI of the head with extensive atherosclerotic changes.  Severe near occlusion right A2 stenosis, right chronic M2 occlusion.  Echocardiogram with ejection fraction  of 70% grade 1 diastolic dysfunction.  Neurology consulted presently on aspirin and Plavix therapy times 3 months then aspirin alone.  Cardiology follow-up pacemaker interrogated showed no signs of A. fib.  Presently on a dysphagia #3 nectar thick liquid diet.  Patient with complaints of hip pain x-rays unremarkable.    Total: 0 NIHSS    Past Medical History  Past Medical History:  Diagnosis Date  . CAD (coronary artery disease)   . Diabetes mellitus without complication (HCC)   . Hyperlipidemia   . Hypertension   . Pacemaker   . Renal artery stenosis (HCC)    s/p bilateral stents  . Sick sinus syndrome (HCC)   . Stroke [facility])    2 previous cva's    Family History  family history includes Hypertension in her other; Lung cancer in her sister.  Prior Rehab/Hospitalizations:  Has the patient had major surgery during 100 days prior to admission? No   CIR 2017. Lived at [facility] and d/c home at Mod I level to ILF after 15 days.  Current Medications   Current Facility-Administered Medications:  .  0.9 %  sodium chloride infusion, , Intravenous, Continuous, Doutova, Anastassia, MD, Stopped at 02/23/18 1126 .  acetaminophen (TYLENOL) tablet 650 mg, 650 mg, Oral, Q4H PRN, 650 mg at 02/25/18 2149 **OR** acetaminophen (TYLENOL) solution 650 mg, 650 mg, Per Tube, Q4H PRN **OR** acetaminophen (TYLENOL) suppository 650 mg, 650 mg, Rectal, Q4H PRN, Doutova, Anastassia, MD .  albuterol (PROVENTIL) (2.5 MG/3ML) 0.083% nebulizer solution 2.5 mg, 2.5 mg, Nebulization, Q4H PRN, Doutova, Anastassia, MD .  amLODipine (NORVASC) tablet 5 mg, 5 mg, Oral, BID, Doutova, Anastassia, MD, 5 mg at 02/26/18 0817 .  aspirin EC tablet 81 mg, 81 mg, Oral, Daily, Micki RileySethi, Pramod S, MD, 81 mg at 02/26/18 0816 .  clopidogrel (PLAVIX) tablet 75 mg, 75 mg, Oral, Daily, Amankwah, Pearl, NP, 75 mg at 02/26/18 0817 .  ezetimibe (ZETIA) tablet 10 mg, 10 mg, Oral, Daily, Mahala MenghiniSamtani, Jai-Gurmukh, MD, 10 mg at  02/26/18 0817 .  guaiFENesin (MUCINEX) 12 hr tablet 600 mg, 600 mg, Oral, BID, Mahala MenghiniSamtani, Jai-Gurmukh, MD, 600 mg at 02/26/18 0817 .  insulin aspart (novoLOG) injection 0-5 Units, 0-5 Units, Subcutaneous, QHS, Doutova, Anastassia, MD, 2 Units at 02/25/18 2200 .  insulin aspart (novoLOG) injection 0-9 Units, 0-9 Units, Subcutaneous, TID WC, Doutova, Anastassia, MD, 1 Units at 02/26/18 [id number] .  metoprolol succinate (TOPROL-XL) 24 hr tablet 100 mg, 100 mg, Oral, Daily, Doutova, Anastassia, MD, 100 mg at 02/26/18 0817 .  RESOURCE THICKENUP CLEAR, , Oral, PRN, Rhetta MuraSamtani, Jai-Gurmukh, MD .  senna-docusate (Senokot-S) tablet 1 tablet, 1 tablet, Oral, QHS PRN, Doutova, Anastassia, MD .  sodium chloride 0.9 % bolus 1,000 mL, 1,000 mL, Intravenous, Once, Doutova, Anastassia, MD  Patients Current Diet: Fall precautions Aspiration precautions Fall precautions Fall precautions Fall precautions DIET DYS 3 Room service appropriate? Yes; Fluid consistency: Nectar Thick  Precautions / Restrictions Precautions Precautions: Fall Precaution Comments: L sided weakness, instability Restrictions Weight Bearing Restrictions: No   Has the patient had 2 or more falls or a fall with injury in the past year?No  Prior Activity Level Community (5-7x/wk): Mod I with Rw pta; [facility]  Home Assistive Devices / Equipment Home Assistive Devices/Equipment: Environmental consultantWalker (specify type), Wheelchair Home Equipment: Walker - 4 wheels, Wheelchair - manual  Prior Device Use: Indicate devices/aids used by the patient prior to current illness, exacerbation or injury? Walker  Prior Functional Level Prior Function Level of Independence: Needs assistance Gait / Transfers Assistance Needed: rollator for ambulation in apartment. Lift chair to assist with transfers. Staff uses w/c to transport to dining room. ADL's / Homemaking Assistance Needed: Staff assists with dressing and bathing. Comments:  Son and daughter in law provided above information.  Self Care: Did the patient need help bathing, dressing, using the toilet or eating?  Needed some help  Indoor Mobility: Did the patient need assistance with walking from room to room (with or without device)? Needed some help  Stairs: Did the patient need assistance with internal or external stairs (with or without device)? Needed some help  Functional Cognition: Did the patient need help planning regular tasks such as shopping or remembering to take medications? Needed some help  Current Functional Level Cognition  Arousal/Alertness: Awake/alert Overall Cognitive Status: Impaired/Different from baseline Current Attention Level: Sustained Orientation Level: Oriented X4 Following Commands: Follows one step commands consistently, Follows one step commands with increased time Safety/Judgement: Decreased awareness of safety, Decreased awareness of deficits General Comments: son present during session; pt is not at baseline cognition per son and continue to have delayed responses  Attention: Selective Selective Attention: Appears intact Memory: (impaired at baseline) Problem  Solving: Impaired Problem Solving Impairment: Functional basic    Extremity Assessment (includes Sensation/Coordination)  Upper Extremity Assessment: Defer to OT evaluation LUE Deficits / Details: 3+/5 strength throughout LUE.   Lower Extremity Assessment: LLE deficits/detail LLE Deficits / Details: grossly 3+/5, unsure if this is new onset or from previous CVA    ADLs  Overall ADL's : Needs assistance/impaired Eating/Feeding: Sitting, Supervision/ safety Grooming: Sitting, Min guard Upper Body Bathing: Minimal assistance, Sitting Lower Body Bathing: Moderate assistance, Sit to/from stand Upper Body Dressing : Sitting, Minimal assistance Lower Body Dressing: Moderate assistance, Sit to/from stand Toilet Transfer: Moderate assistance, Minimal assistance, RW,  Ambulation Toilet Transfer Details (indicate cue type and reason): Mod assist to power up to standing and min assist once ambulating.  Toileting- Clothing Manipulation and Hygiene: Moderate assistance, Sit to/from stand Toileting - Clothing Manipulation Details (indicate cue type and reason): Assist for thoroughness Functional mobility during ADLs: Minimal assistance, Rolling walker, Moderate assistance General ADL Comments: Pt with decreased activity tolerance for ADL and decreased dynamic balance impacting ADL participation. Did note L UE weakness also impacting functionality.     Mobility  Overal bed mobility: Needs Assistance Bed Mobility: Supine to Sit Supine to sit: Min guard, HOB elevated Sit to supine: Mod assist, HOB elevated General bed mobility comments: cues for sequencing and use of rail; min guard for safety    Transfers  Overall transfer level: Needs assistance Equipment used: Rolling walker (2 wheeled) Transfers: Sit to/from Stand Sit to Stand: Mod assist, Min assist Stand pivot transfers: Mod assist General transfer comment: cues for hand placement and assistance to power up into standing and gain balance upon stand; from EOB and recliner; increased time and cues needed to transition L hand placement to RW    Ambulation / Gait / Stairs / Wheelchair Mobility  Ambulation/Gait Ambulation/Gait assistance: Mod assist, +2 safety/equipment(chair follow) Ambulation Distance (Feet): (6ft X2) Assistive device: Rolling walker (2 wheeled) Gait Pattern/deviations: Step-to pattern, Decreased step length - left, Decreased dorsiflexion - left, Trunk flexed, Decreased stance time - left General Gait Details: cues for sequencing and safe proximity to RW; assistance required for balance and weight shifting however pt able to manage RW more independently this session; L LE weakness and buckling noted with fatigue    Posture / Balance Balance Overall balance assessment: Needs  assistance Sitting-balance support: Feet supported, Bilateral upper extremity supported Sitting balance-Leahy Scale: Fair Standing balance support: During functional activity, Bilateral upper extremity supported Standing balance-Leahy Scale: Poor Standing balance comment: reliant on BUE support    Special needs/care consideration BiPAP/CPAP  N/a CPM n/a Continuous Drip IV  N/a Dialysis  N/a Life Vest  N/a Oxygen  N/a Special Bed  N/a Trach Size n/a Wound Vac n/a Skin intact Bowel mgmt: continent LBM 4/15 Bladder mgmt: external catheter Diabetic mgmt yes   Previous Home Environment Living Arrangements: Other (Comment)(lives at [facility] for 8 months; was at [place] )  Lives With: Other (Comment) Available Help at Discharge: Personal care attendant Type of Home: Assisted living Care [facility] Home Layout: One level Home Access: Level entry, Engineer, maintenance (IT) Shower/Tub: Health visitor: Handicapped height Bathroom Accessibility: Yes How Accessible: Accessible via wheelchair, Accessible via walker Home Care Services: Other (Comment) Additional Comments: Lives in assisted living facility with assistance for bathing and dressing per pt report. Makes small snacks for herself but has meals provided.   Discharge Living Setting Plans for Discharge Living Setting: Other (Comment)(ALF, [facility]) Type of  Home at Discharge: Other (Comment)(ALF) Discharge Home Layout: One level Discharge Home Access: Elevator Discharge Bathroom Shower/Tub: Walk-in shower Discharge Bathroom Toilet: Handicapped height Discharge Bathroom Accessibility: Yes How Accessible: Accessible via wheelchair, Accessible via walker Does the patient have any problems obtaining your medications?: No  Social/Family/Support Systems Patient Roles: Parent(2 sons; married 3 times; widow for 3 years) Solicitor Information: Charlene Greene, son Anticipated Caregiver: Aides  assist at ALF Anticipated Caregiver's Contact Information: Charlene Greene, son and Trena Platt, Resident Care Director Ability/Limitations of Caregiver: patient can pay for different level of aide services as needed Caregiver Availability: 24/7 Discharge Plan Discussed with Primary Caregiver: Yes Is Caregiver In Agreement with Plan?: Yes Does Caregiver/Family have Issues with Lodging/Transportation while Pt is in Rehab?: No  Goals/Additional Needs Patient/Family Goal for Rehab: supervision to mon assist with PT, OT, and SLP Expected length of stay: ELOS 14 to 17 days Pt/Family Agrees to Admission and willing to participate: Yes Program Orientation Provided & Reviewed with Pt/Caregiver Including Roles  & Responsibilities: Yes  Decrease burden of Care through IP rehab admission: n/a  Possible need for SNF placement upon discharge: not anticipated  Patient Condition: This patient's condition remains as documented in the consult dated 02/26/2018, in which the Rehabilitation Physician determined and documented that the patient's condition is appropriate for intensive rehabilitative care in an inpatient rehabilitation facility. Will admit to inpatient rehab today.  Preadmission Screen Completed By:  Clois Dupes, 02/26/2018 4:08 PM ______________________________________________________________________   Discussed status with Dr. Allena Katz on 02/26/2018 at  1607 and received telephone approval for admission today.  Admission Coordinator:  Clois Dupes, time 1610 Date 02/26/2018

## 2018-02-26 NOTE — Progress Notes (Signed)
To rehab unit alert patient with RN and daughter in law. Fall precaution discussed with patient and family signed by patient. Oriented to unit set up.REhab schedule discussed with patient and family member.

## 2018-02-26 NOTE — Consult Note (Signed)
Physical Medicine and Rehabilitation Consult Reason for Consult: Decreased functional mobility with left-sided weakness, slurred speech and facial droop Referring Physician: [facility]   HPI: Charlene Greene is a 84 y.o. right-handed female with history of CVA 2017 maintained aspirin and plavix, diabetes mellitus, hypertension, sick sinus syndrome with pacemaker, diastolic congestive heart failure.  Patient received inpatient rehab services November 2017 for right basal ganglia infarction secondary to small vessel disease.  Per chart review patient is a resident of assisted living facility [facility].  Patient does receive assistance for some bathing and dressing.  Her meals are provided.  She uses a walker and wheelchair at the assisted living facility.  Presented 02/22/2018 with altered mental status.Cranial CT negative.  MRI showed patchy small volume acute ischemic nonhemorrhagic infarct involving the left thalamus, left occipital lobe and left temporal lobe, left PCA territory.  MRA of the neck was negative.  MRI of the head with extensive atherosclerotic changes.  Severe near occlusion right A2 stenosis, chronic right M2 occlusion.  Echocardiogram with ejection fraction of 70% grade 1 diastolic dysfunction.  Neurology consulted presently on aspirin and Plavix recommendations for 3 months then aspirin alone.  Cardiology follow-up pacemaker interrogated showing no signs of A. fib.  Presently on a dysphagia #3 nectar thick liquid diet.  Physical therapy evaluation completed.  Family has requested rehab consult for opportunity to return back to assisted living facility.  Patient complains of bilateral lower extremity pain which is fairly constant.  Hip x-rays 02/23/2018 were unremarkable.  She states that she does not feel the pain is in her joints but her entire leg.   Review of Systems  Constitutional: Negative for chills and fever.  HENT: Negative for hearing loss.   Eyes: Negative for  blurred vision and double vision.  Respiratory: Positive for shortness of breath. Negative for cough.   Cardiovascular: Positive for palpitations and leg swelling. Negative for chest pain.  Gastrointestinal: Positive for constipation. Negative for nausea and vomiting.  Genitourinary: Negative for dysuria and hematuria.  Musculoskeletal: Positive for myalgias.  Skin: Negative for rash.  Neurological: Positive for speech change and focal weakness.  All other systems reviewed and are negative.  Past Medical History:  Diagnosis Date  . CAD (coronary artery disease)   . Diabetes mellitus without complication (HCC)   . Hyperlipidemia   . Hypertension   . Pacemaker   . Renal artery stenosis (HCC)    s/p bilateral stents  . Sick sinus syndrome (HCC)   . Stroke [facility])    2 previous cva's   Past Surgical History:  Procedure Laterality Date  . PACEMAKER PLACEMENT  05/15/2014   MDT Advisa MRI implanted at [facility] for sick sinus syndrome   Family History  Problem Relation Age of Onset  . Lung cancer Sister   . Hypertension Other   . CAD Neg Hx   . Stroke Neg Hx    Social History:  reports that she has never smoked. She has never used smokeless tobacco. She reports that she does not drink alcohol or use drugs. Allergies:  Allergies  Allergen Reactions  . Statins Other (See Comments)    Intol to numerous statins still feels has some muscle weakness, myalgia  . Other Other (See Comments)    Unknown reaction to dye (listed on [facility] 02/22/18)  . Amlodipine Other (See Comments)    unknown  . Celecoxib Nausea Only and Other (See Comments)  . Codeine Other (See Comments)    unknown  .  Naproxen Other (See Comments)    unknown  . Piroxicam Other (See Comments)    unknown   Medications Prior to Admission  Medication Sig Dispense Refill  . acetaminophen (TYLENOL) 500 MG tablet Take 500 mg by mouth 2 (two) times daily.    Marland Kitchen amLODipine (NORVASC) 5 MG tablet Take 5 mg by mouth 2 (two) times  daily.    Marland Kitchen aspirin 325 MG tablet Take 325 mg by mouth daily.    . busPIRone (BUSPAR) 5 MG tablet Take 1 tablet (5 mg total) by mouth 2 (two) times daily. 60 tablet 0  . cholecalciferol (VITAMIN D) 1000 units tablet Take 1,000 Units by mouth daily.    . fenofibrate 160 MG tablet Take 1 tablet (160 mg total) by mouth daily. 30 tablet 0  . furosemide (LASIX) 20 MG tablet Take 10 mg by mouth daily.    Marland Kitchen gabapentin (NEURONTIN) 100 MG capsule Take 200 mg by mouth at bedtime.    Marland Kitchen Hexylresorcinol (THROAT LOZENGES MT) Use as directed 1 lozenge in the mouth or throat every 6 (six) hours as needed (sore throat (max 4 tabs/24 hours)).    Marland Kitchen insulin aspart (NOVOLOG) 100 UNIT/ML injection Inject 2-12 Units into the skin 4 (four) times daily as needed for high blood sugar (CBG >200). Per sliding scale: CBG 201-250 2 units, 251-300 4 units, 301-350 6 units, 351-400 8 units, 401-450 10 units, 451-500 12 units, >500 call 911    . isosorbide mononitrate (IMDUR) 60 MG 24 hr tablet Take 1 tablet (60 mg total) by mouth daily. 30 tablet 1  . lisinopril (PRINIVIL,ZESTRIL) 40 MG tablet Take 1 tablet (40 mg total) by mouth 2 (two) times daily. (Patient taking differently: Take 40 mg by mouth daily. ) 60 tablet 1  . Magnesium 250 MG TABS Take 250 mg by mouth at bedtime.    . metoprolol succinate (TOPROL-XL) 100 MG 24 hr tablet Take 1 tablet (100 mg total) by mouth daily. 30 tablet 1  . nitroGLYCERIN (NITROSTAT) 0.4 MG SL tablet Place 0.4 mg under the tongue every 5 (five) minutes as needed for chest pain (max 3 doses in 15 minutes).    . psyllium (REGULOID) 0.52 g capsule Take 0.52 g by mouth at bedtime.    . ranolazine (RANEXA) 500 MG 12 hr tablet Take 1 tablet (500 mg total) by mouth 2 (two) times daily. 60 tablet 1  . senna (SENOKOT) 8.6 MG TABS tablet Take 1 tablet by mouth daily as needed (constipation).    Marland Kitchen amLODipine (NORVASC) 10 MG tablet Take 1 tablet (10 mg total) by mouth 2 (two) times daily. (Patient not taking:  Reported on 02/22/2018) 60 tablet 1  . clopidogrel (PLAVIX) 75 MG tablet Take 1 tablet (75 mg total) by mouth daily. (Patient not taking: Reported on 02/22/2018) 30 tablet 1  . ezetimibe (ZETIA) 10 MG tablet Take 1 tablet (10 mg total) by mouth daily. (Patient not taking: Reported on 02/22/2018) 30 tablet 1  . furosemide (LASIX) 40 MG tablet Take 1 tablet (40 mg total) by mouth 2 (two) times daily. (Patient not taking: Reported on 02/22/2018) 60 tablet 1  . glimepiride (AMARYL) 2 MG tablet Take 1 tablet (2 mg total) by mouth 2 (two) times daily with a meal. (Patient not taking: Reported on 02/22/2018) 60 tablet 1  . hydrALAZINE (APRESOLINE) 25 MG tablet Take 3 tablets (75 mg total) by mouth every 8 (eight) hours. (Patient not taking: Reported on 02/22/2018) 90 tablet 0  . Multiple  Vitamins-Minerals (PRESERVISION AREDS 2) CAPS Take 1 capsule by mouth 2 (two) times daily. (Patient not taking: Reported on 02/22/2018) 60 capsule 0  . niacin 250 MG tablet Take 1 tablet (250 mg total) by mouth every morning. (Patient not taking: Reported on 02/22/2018) 30 tablet 1  . potassium chloride SA (K-DUR,KLOR-CON) 20 MEQ tablet Take 1 tablet (20 mEq total) by mouth 2 (two) times daily. (Patient not taking: Reported on 02/22/2018) 60 tablet 1  . pregabalin (LYRICA) 75 MG capsule Take 1 capsule (75 mg total) by mouth 2 (two) times daily. (Patient not taking: Reported on 02/22/2018) 60 capsule 1  . senna-docusate (SENOKOT-S) 8.6-50 MG tablet Take 2 tablets by mouth 2 (two) times daily. (Patient not taking: Reported on 02/22/2018)      Home: Home Living Family/patient expects to be discharged to:: Assisted living Type of Home: Assisted living Home Equipment: Dan Humphreys - 4 wheels, Wheelchair - manual Additional Comments: Lives in assisted living facility with assistance for bathing and dressing per pt report. Makes small snacks for herself but has meals provided.   Functional History: Prior Function Level of Independence: Needs  assistance Gait / Transfers Assistance Needed: rollator for ambulation in apartment. Lift chair to assist with transfers. Staff uses w/c to transport to dining room. ADL's / Homemaking Assistance Needed: Staff assists with dressing and bathing. Comments: Son and daughter in law provided above information. Functional Status:  Mobility: Bed Mobility Overal bed mobility: Needs Assistance Bed Mobility: Supine to Sit Supine to sit: Min guard, HOB elevated Sit to supine: Mod assist, HOB elevated General bed mobility comments: cues for sequencing and use of rail; min guard for safety Transfers Overall transfer level: Needs assistance Equipment used: Rolling walker (2 wheeled) Transfers: Sit to/from Stand Sit to Stand: Mod assist, Min assist Stand pivot transfers: Mod assist General transfer comment: cues for safe hand placement; assist to power up into standing with mod A from EOB and min A from recliner and BSC Ambulation/Gait Ambulation/Gait assistance: Mod assist, +2 safety/equipment(chair follow) Ambulation Distance (Feet): (53ft X2) Assistive device: Rolling walker (2 wheeled) Gait Pattern/deviations: Step-to pattern, Decreased step length - left, Decreased dorsiflexion - left, Trunk flexed, Ataxic General Gait Details: assist for balance, weight shifting, and safe management of RW; seated rest break required due to fatigue; pt sat abruptly when chair pulled up behind her; after rest pt ambulated another ~3ft however limited by incontinence of stool and BSC brought up behind pt    ADL: ADL Overall ADL's : Needs assistance/impaired Eating/Feeding: Sitting, Supervision/ safety Grooming: Sitting, Min guard Upper Body Bathing: Minimal assistance, Sitting Lower Body Bathing: Moderate assistance, Sit to/from stand Upper Body Dressing : Sitting, Minimal assistance Lower Body Dressing: Moderate assistance, Sit to/from stand Toilet Transfer: Moderate assistance, Minimal assistance, RW,  Ambulation Toilet Transfer Details (indicate cue type and reason): Mod assist to power up to standing and min assist once ambulating.  Toileting- Clothing Manipulation and Hygiene: Moderate assistance, Sit to/from stand Toileting - Clothing Manipulation Details (indicate cue type and reason): Assist for thoroughness Functional mobility during ADLs: Minimal assistance, Rolling walker, Moderate assistance General ADL Comments: Pt with decreased activity tolerance for ADL and decreased dynamic balance impacting ADL participation. Did note L UE weakness also impacting functionality.   Cognition: Cognition Overall Cognitive Status: Impaired/Different from baseline Arousal/Alertness: Awake/alert Orientation Level: Oriented X4 Attention: Selective Selective Attention: Appears intact Memory: (impaired at baseline) Problem Solving: Impaired Problem Solving Impairment: Functional basic Cognition Arousal/Alertness: Awake/alert Behavior During Therapy: WFL for tasks assessed/performed Overall  Cognitive Status: Impaired/Different from baseline Area of Impairment: Orientation, Memory, Following commands, Safety/judgement, Awareness, Problem solving, Attention Orientation Level: Disoriented to, Time Current Attention Level: Sustained Memory: Decreased short-term memory Following Commands: Follows one step commands consistently, Follows one step commands with increased time Safety/Judgement: Decreased awareness of safety, Decreased awareness of deficits Awareness: Emergent Problem Solving: Slow processing, Requires verbal cues, Decreased initiation, Difficulty sequencing General Comments: poor historian and responded "I can't remember" to a lot of questions asked; pt incontinent of stool while ambulating and when asked if she felt the urge to have a BM pt replied "I can't remember"  Blood pressure (!) 205/81, pulse 61, temperature 97.7 F (36.5 C), temperature source Axillary, resp. rate 20, height  5\' 5"  (1.651 m), weight 75.9 kg (167 lb 5.3 oz), SpO2 97 %. Physical Exam  Vitals reviewed. HENT:  Head: Normocephalic.  Eyes: EOM are normal. Right eye exhibits no discharge. Left eye exhibits no discharge.  Neck: Normal range of motion. Neck supple. No thyromegaly present.  Cardiovascular:  Cardiac rate controlled  Respiratory: Effort normal and breath sounds normal. No respiratory distress.  GI: Soft. Bowel sounds are normal. She exhibits no distension.  Neurological: She is alert.  Makes good eye contact with examiner.  Follows commands.  She could not recall her latest rehab stay in October 2017 after CVA.  She provides her name and age.  Skin: Skin is warm and dry.  Motor strength is 4/5 bilateral deltoid bicep tricep finger flexors extensors, hip flexor knee extensor ankle dorsiflexor.  Sensation intact light touch bilateral upper and lower limbs Musculoskeletal no pain with shoulder wrist and hand hip knee and ankle range of motion. Pedal pulses are thready bilaterally, toes are cool but skin is pink  Results for orders placed or performed during the hospital encounter of 02/22/18 (from the past 24 hour(s))  Glucose, capillary     Status: Abnormal   Collection Time: 02/25/18 11:40 AM  Result Value Ref Range   Glucose-Capillary 245 (H) 65 - 99 mg/dL   Comment 1 Notify RN    Comment 2 Document in Chart   Glucose, capillary     Status: Abnormal   Collection Time: 02/25/18  4:13 PM  Result Value Ref Range   Glucose-Capillary 170 (H) 65 - 99 mg/dL   Comment 1 Notify RN    Comment 2 Document in Chart   Glucose, capillary     Status: Abnormal   Collection Time: 02/25/18  9:23 PM  Result Value Ref Range   Glucose-Capillary 218 (H) 65 - 99 mg/dL   Comment 1 Notify RN    Comment 2 Document in Chart   Basic metabolic panel     Status: Abnormal   Collection Time: 02/26/18  5:46 AM  Result Value Ref Range   Sodium 140 135 - 145 mmol/L   Potassium 3.6 3.5 - 5.1 mmol/L   Chloride  103 101 - 111 mmol/L   CO2 28 22 - 32 mmol/L   Glucose, Bld 153 (H) 65 - 99 mg/dL   BUN 16 6 - 20 mg/dL   Creatinine, Ser 1.61 0.44 - 1.00 mg/dL   Calcium 9.1 8.9 - 09.6 mg/dL   GFR calc non Af Amer 59 (L) >60 mL/min   GFR calc Af Amer >60 >60 mL/min   Anion gap 9 5 - 15  Glucose, capillary     Status: Abnormal   Collection Time: 02/26/18  6:06 AM  Result Value Ref Range   Glucose-Capillary 137 (H)  65 - 99 mg/dL   Comment 1 Notify RN    Comment 2 Document in Chart    Dg Chest 2 View  Result Date: 02/25/2018 CLINICAL DATA:  Cough and congestion EXAM: CHEST - 2 VIEW COMPARISON:  02/22/2018 FINDINGS: Left-sided pacing device as before. No significant pleural effusion. Stable cardiomediastinal silhouette with aortic atherosclerosis. No pneumothorax. Small calcified loose bodies at the shoulders. IMPRESSION: No active cardiopulmonary disease.  Borderline to mild cardiomegaly. Electronically Signed   By: Jasmine Pang M.D.   On: 02/25/2018 19:59   Mr Maxine Glenn Neck W Wo Contrast  Result Date: 02/24/2018 CLINICAL DATA:  Initial evaluation for acute left-sided weakness, slurred speech, left facial droop. EXAM: MRI HEAD WITHOUT CONTRAST MRA HEAD WITHOUT CONTRAST MRA NECK WITHOUT CONTRAST TECHNIQUE: Multiplanar, multiecho pulse sequences of the brain and surrounding structures were obtained without intravenous contrast. Angiographic images of the Circle of Willis were obtained using MRA technique without intravenous contrast. Angiographic images of the neck were obtained using MRA technique without intravenous contrast. Carotid stenosis measurements (when applicable) are obtained utilizing NASCET criteria, using the distal internal carotid diameter as the denominator. COMPARISON:  Prior CT from 02/22/2018. FINDINGS: MRI HEAD FINDINGS Brain: Diffuse prominence of the CSF containing spaces is compatible with generalized age-related cerebral atrophy. Remote lacunar infarcts involving the right basal ganglia and  left thalamus noted. Patchy T2/FLAIR hyperintensity involving the periventricular, deep, and subcortical white matter both cerebral hemispheres most consistent with chronic small vessel ischemic disease. Changes are overall moderate in nature. Patchy small volume diffusion abnormality seen involving the left thalamus/posterior limb of the left internal capsule, consistent with acute ischemic infarct (series 5001, image 61, 60, 59). Additional 5 mm acute ischemic cortical infarct present within the left occipital lobe (series 5001, image 57). Mild diffusion abnormality seen involving the mesial left temporal lobe/left hippocampus (series 5001, image 58). Changes primarily involve the left PCA territory. No associated hemorrhage or mass effect. No other evidence for acute or subacute ischemia. Gray-white matter differentiation otherwise maintained. No foci of susceptibility artifact to suggest acute or chronic intracranial hemorrhage. No mass lesion, midline shift or mass effect. No hydrocephalus. No extra-axial fluid collection. Major dural sinuses grossly patent. Pituitary gland suprasellar region normal. Midline structures intact and normal. Vascular: Major intracranial vascular flow voids are maintained. Skull and upper cervical spine: Craniocervical junction normal. Upper cervical spine within normal limits. Bone marrow signal intensity normal. No scalp soft tissue abnormality. Sinuses/Orbits: Globes and orbital soft tissues within normal limits. Patient status post cataract extraction bilaterally. Paranasal sinuses are largely clear. No mastoid effusion. Inner ear structures grossly normal. Other: None. MRA HEAD FINDINGS ANTERIOR CIRCULATION: Distal cervical segments of the internal carotid arteries are patent with antegrade flow. Petrous segments widely patent bilaterally. Scattered atheromatous irregularity within the cavernous/supraclinoid ICAs bilaterally. Secondary moderate stenosis of approximately 50% at  the supraclinoid right ICA (series 1001, image 100). No other significant flow-limiting stenosis. 4 mm right paraophthalmic aneurysm noted (series 1001, image 88). ICA termini patent bilaterally. Atheromatous irregularity with moderate to severe stenoses present within the left A1 segment. Right A1 segment irregular but widely patent. Short-segment moderate to severe left A2 stenosis noted. There is severe near occlusive stenosis of the proximal right A2 segment (series 1001, image 129). Scant irregular flow seen distally within the right A2 segment. Extensive atheromatous irregularity throughout the left M1 segment with moderate proximal and mid left M1 stenoses. Short-segment severe proximal left M2 stenoses noted. Extensive atheromatous irregularity throughout the left MCA branches distally.  Right M1 segment patent without stenosis or occlusion. There appears to be a proximal right M2 occlusion, inferior division (series 1035001, image 12). Extensive atheromatous irregularity throughout the remainder of the right MCA branches which do remain patent. POSTERIOR CIRCULATION: Vertebral arteries are code dominant. Left vertebral artery patent without flow-limiting stenosis. Severe proximal right V4 stenosis noted. Posterior inferior cerebral arteries patent proximally. Basilar artery patent to its distal aspect without flow-limiting stenosis. Superior cerebral arteries patent bilaterally. Short-segment severe bilateral P1 stenoses noted. Prominent bilateral posterior communicating arteries noted. Severe proximal left P2 stenosis with near occlusion. Scant flow seen distally within the left PCA. Additional severe near occlusive distal right P2 stenosis. MRA NECK FINDINGS Examination somewhat technically limited by lack of IV contrast. Visualized aortic arch of normal caliber with normal branch pattern. No flow-limiting stenosis about the origin of the great vessels. Visualized subclavian arteries widely patent  bilaterally. Right common carotid artery patent from its origin to the bifurcation without stenosis. Mild atheromatous irregularity about the right carotid bifurcation without flow-limiting stenosis. Right ICA mildly tortuous but widely patent distally to the skull base without stenosis or occlusion. Left common carotid artery tortuous proximally but widely patent to the bifurcation without stenosis. Mild atheromatous irregularity about the left bifurcation without flow-limiting stenosis. Left ICA tortuous but widely patent distally to the skull base without stenosis or occlusion. Left vertebral artery arises separately from the aortic arch. Vertebral arteries widely patent within the neck without stenosis or occlusion. IMPRESSION: MRI HEAD IMPRESSION: 1. Patchy small volume acute ischemic nonhemorrhagic infarct involving the left thalamus, left occipital lobe, and mesial left temporal lobe, left PCA territory. 2. Remote lacunar infarcts involving the right basal ganglia and left thalamus. 3. Moderate chronic small vessel ischemic disease. MRA HEAD IMPRESSION: 1. Extensive widespread intracranial atherosclerotic change throughout the intracranial circulation as above. Notable findings include moderate left M1 with severe left M2 stenoses, severe near occlusive right A2 stenosis, chronic right M2 occlusion, severe right V4 stenosis, and severe near occlusive bilateral P1 and P2 stenoses. 2. 4 mm right paraophthalmic aneurysm. MRA NECK IMPRESSION: Negative MRA of the neck. No high-grade or critical flow limiting stenosis within either carotid artery system. Vertebral arteries widely patent within the neck. Electronically Signed   By: Rise Mu M.D.   On: 02/24/2018 17:46   Mr Brain Wo Contrast  Result Date: 02/24/2018 CLINICAL DATA:  Initial evaluation for acute left-sided weakness, slurred speech, left facial droop. EXAM: MRI HEAD WITHOUT CONTRAST MRA HEAD WITHOUT CONTRAST MRA NECK WITHOUT CONTRAST  TECHNIQUE: Multiplanar, multiecho pulse sequences of the brain and surrounding structures were obtained without intravenous contrast. Angiographic images of the Circle of Willis were obtained using MRA technique without intravenous contrast. Angiographic images of the neck were obtained using MRA technique without intravenous contrast. Carotid stenosis measurements (when applicable) are obtained utilizing NASCET criteria, using the distal internal carotid diameter as the denominator. COMPARISON:  Prior CT from 02/22/2018. FINDINGS: MRI HEAD FINDINGS Brain: Diffuse prominence of the CSF containing spaces is compatible with generalized age-related cerebral atrophy. Remote lacunar infarcts involving the right basal ganglia and left thalamus noted. Patchy T2/FLAIR hyperintensity involving the periventricular, deep, and subcortical white matter both cerebral hemispheres most consistent with chronic small vessel ischemic disease. Changes are overall moderate in nature. Patchy small volume diffusion abnormality seen involving the left thalamus/posterior limb of the left internal capsule, consistent with acute ischemic infarct (series 5001, image 61, 60, 59). Additional 5 mm acute ischemic cortical infarct present within the left occipital  lobe (series 5001, image 57). Mild diffusion abnormality seen involving the mesial left temporal lobe/left hippocampus (series 5001, image 58). Changes primarily involve the left PCA territory. No associated hemorrhage or mass effect. No other evidence for acute or subacute ischemia. Gray-white matter differentiation otherwise maintained. No foci of susceptibility artifact to suggest acute or chronic intracranial hemorrhage. No mass lesion, midline shift or mass effect. No hydrocephalus. No extra-axial fluid collection. Major dural sinuses grossly patent. Pituitary gland suprasellar region normal. Midline structures intact and normal. Vascular: Major intracranial vascular flow voids are  maintained. Skull and upper cervical spine: Craniocervical junction normal. Upper cervical spine within normal limits. Bone marrow signal intensity normal. No scalp soft tissue abnormality. Sinuses/Orbits: Globes and orbital soft tissues within normal limits. Patient status post cataract extraction bilaterally. Paranasal sinuses are largely clear. No mastoid effusion. Inner ear structures grossly normal. Other: None. MRA HEAD FINDINGS ANTERIOR CIRCULATION: Distal cervical segments of the internal carotid arteries are patent with antegrade flow. Petrous segments widely patent bilaterally. Scattered atheromatous irregularity within the cavernous/supraclinoid ICAs bilaterally. Secondary moderate stenosis of approximately 50% at the supraclinoid right ICA (series 1001, image 100). No other significant flow-limiting stenosis. 4 mm right paraophthalmic aneurysm noted (series 1001, image 88). ICA termini patent bilaterally. Atheromatous irregularity with moderate to severe stenoses present within the left A1 segment. Right A1 segment irregular but widely patent. Short-segment moderate to severe left A2 stenosis noted. There is severe near occlusive stenosis of the proximal right A2 segment (series 1001, image 129). Scant irregular flow seen distally within the right A2 segment. Extensive atheromatous irregularity throughout the left M1 segment with moderate proximal and mid left M1 stenoses. Short-segment severe proximal left M2 stenoses noted. Extensive atheromatous irregularity throughout the left MCA branches distally. Right M1 segment patent without stenosis or occlusion. There appears to be a proximal right M2 occlusion, inferior division (series [id number], image 12). Extensive atheromatous irregularity throughout the remainder of the right MCA branches which do remain patent. POSTERIOR CIRCULATION: Vertebral arteries are code dominant. Left vertebral artery patent without flow-limiting stenosis. Severe proximal right  V4 stenosis noted. Posterior inferior cerebral arteries patent proximally. Basilar artery patent to its distal aspect without flow-limiting stenosis. Superior cerebral arteries patent bilaterally. Short-segment severe bilateral P1 stenoses noted. Prominent bilateral posterior communicating arteries noted. Severe proximal left P2 stenosis with near occlusion. Scant flow seen distally within the left PCA. Additional severe near occlusive distal right P2 stenosis. MRA NECK FINDINGS Examination somewhat technically limited by lack of IV contrast. Visualized aortic arch of normal caliber with normal branch pattern. No flow-limiting stenosis about the origin of the great vessels. Visualized subclavian arteries widely patent bilaterally. Right common carotid artery patent from its origin to the bifurcation without stenosis. Mild atheromatous irregularity about the right carotid bifurcation without flow-limiting stenosis. Right ICA mildly tortuous but widely patent distally to the skull base without stenosis or occlusion. Left common carotid artery tortuous proximally but widely patent to the bifurcation without stenosis. Mild atheromatous irregularity about the left bifurcation without flow-limiting stenosis. Left ICA tortuous but widely patent distally to the skull base without stenosis or occlusion. Left vertebral artery arises separately from the aortic arch. Vertebral arteries widely patent within the neck without stenosis or occlusion. IMPRESSION: MRI HEAD IMPRESSION: 1. Patchy small volume acute ischemic nonhemorrhagic infarct involving the left thalamus, left occipital lobe, and mesial left temporal lobe, left PCA territory. 2. Remote lacunar infarcts involving the right basal ganglia and left thalamus. 3. Moderate chronic small vessel ischemic disease.  MRA HEAD IMPRESSION: 1. Extensive widespread intracranial atherosclerotic change throughout the intracranial circulation as above. Notable findings include moderate  left M1 with severe left M2 stenoses, severe near occlusive right A2 stenosis, chronic right M2 occlusion, severe right V4 stenosis, and severe near occlusive bilateral P1 and P2 stenoses. 2. 4 mm right paraophthalmic aneurysm. MRA NECK IMPRESSION: Negative MRA of the neck. No high-grade or critical flow limiting stenosis within either carotid artery system. Vertebral arteries widely patent within the neck. Electronically Signed   By: Rise Mu M.D.   On: 02/24/2018 17:46   Dg Swallowing Func-speech Pathology  Result Date: 02/25/2018 Objective Swallowing Evaluation: Type of Study: MBS-Modified Barium Swallow Study  Patient Details Name: Hilde Churchman MRN: [id number] Date of Birth: 1922-06-20 Today's Date: 02/25/2018 Time: SLP Start Time (ACUTE ONLY): 0930 -SLP Stop Time (ACUTE ONLY): 0948 SLP Time Calculation (min) (ACUTE ONLY): 18 min Past Medical History: Past Medical History: Diagnosis Date . CAD (coronary artery disease)  . Diabetes mellitus without complication (HCC)  . Hyperlipidemia  . Hypertension  . Pacemaker  . Renal artery stenosis (HCC)   s/p bilateral stents . Sick sinus syndrome (HCC)  . Stroke [facility])   2 previous cva's Past Surgical History: Past Surgical History: Procedure Laterality Date . PACEMAKER PLACEMENT  05/15/2014  MDT Advisa MRI implanted at [facility] for sick sinus syndrome HPI: Anshu Wehner is a 84 y.o. female with medical history significant of CVA 2017, DM 2, HTN, sick sinus syndrome, sp pacemaker, diastolic CHF presented with left side weakness, slurred speech and facial droop similar to prior presentation of CVA in 2107. Per chart pt reports 1 week of cough sometimes worse with eating. Patient had dysphasia with prior CVA but it resolved and she is on regular diet. CXR No edema or consolidation. Head CT No acute intracranial abnormality. BSE 09/2016 mild oropharyngeal dysphagia, reg/thin recommended.  Subjective: pt pleasant, cooperative Assessment / Plan / Recommendation [facility]  IP CLINICAL IMPRESSIONS 02/25/2018 Clinical Impression Pt presents with a mild oropharyngeal dysphagia.  Pt has decreased oral control of boluses in the setting of mild base of tongue weakness which leads to premature spillage and delay in swallow initiation to the level of the pyriforms.  This, in combination with decreased airway closure during the swallow, lead to penetration with nectar thick liquids (instance of deep penetration on initial sip which improved to flash penetration on subsequent boluses). Pt also had deep penetration with small controlled sips of thin liquids which worsened to aspiration when challenged to take large, consecutive boluses.  Furthermore, pt aspirated thin liquids when consuming mixed consistency boluses.  Aspiration of thins alone was trace in amount and went unsensed by pt; however, larger amounts of aspiration seen following mixed consistency boluses elicited a strong cough response.  Mild pharyngeal residue was also evident throughout study, suspect due to a combination of decreased UES relaxation and base of tongue weakness.  Taking small sips followed by a second swallow to clear residue appeared to be an effective technique for maximizing airway protection with all consistencies.  For now, recommend that pt remain on dys 3 textures and nectar thick liquids with full supervision for use of swallowing precautions.  Pt may have sips of water in between meals following oral care with full staff supervision per the water protocol.   SLP Visit Diagnosis Dysphagia, oropharyngeal phase (R13.12) Attention and concentration deficit following -- Frontal lobe and executive function deficit following -- Impact on safety and function Mild aspiration risk   [facility]  IP TREATMENT RECOMMENDATION 02/25/2018 Treatment Recommendations Therapy as outlined in treatment plan below   Prognosis 02/25/2018 Prognosis for Safe Diet Advancement Good Barriers to Reach Goals -- Barriers/Prognosis Comment -- [facility] IP  DIET RECOMMENDATION 02/25/2018 SLP Diet Recommendations Dysphagia 3 (Mech soft) solids;Nectar thick liquid Liquid Administration via Cup;Straw Medication Administration Crushed with puree Compensations Slow rate;Small sips/bites Postural Changes Seated upright at 90 degrees   [facility] IP OTHER RECOMMENDATIONS 02/25/2018 Recommended Consults -- Oral Care Recommendations Oral care BID Other Recommendations --   [facility] IP FOLLOW UP RECOMMENDATIONS 02/25/2018 Follow up Recommendations [facility]illed [facility] FREQUENCY AND DURATION 02/25/2018 Speech Therapy Frequency (ACUTE ONLY) min 2x/week Treatment Duration --      [facility] IP ORAL PHASE 02/25/2018 Oral Phase Impaired Oral - Pudding Teaspoon -- Oral - Pudding Cup -- Oral - Honey Teaspoon -- Oral - Honey Cup -- Oral - Nectar Teaspoon -- Oral - Nectar Cup Premature spillage;Decreased bolus cohesion Oral - Nectar Straw Premature spillage Oral - Thin Teaspoon -- Oral - Thin Cup Premature spillage;Decreased bolus cohesion;Weak lingual manipulation Oral - Thin Straw Premature spillage Oral - Puree -- Oral - Mech Soft -- Oral - Regular -- Oral - Multi-Consistency Premature spillage;Decreased bolus cohesion Oral - Pill Premature spillage;Decreased bolus cohesion Oral Phase - Comment --  [facility] IP PHARYNGEAL PHASE 02/25/2018 Pharyngeal Phase Impaired Pharyngeal- Pudding Teaspoon -- Pharyngeal -- Pharyngeal- Pudding Cup -- Pharyngeal -- Pharyngeal- Honey Teaspoon -- Pharyngeal -- Pharyngeal- Honey Cup -- Pharyngeal -- Pharyngeal- Nectar Teaspoon -- Pharyngeal -- Pharyngeal- Nectar Cup Reduced airway/laryngeal closure;Reduced tongue base retraction;Penetration/Aspiration during swallow;Delayed swallow initiation-pyriform sinuses;Pharyngeal residue - valleculae;Pharyngeal residue - pyriform Pharyngeal Material enters airway, CONTACTS cords and not ejected out Pharyngeal- Nectar Straw Delayed swallow initiation-pyriform sinuses;Penetration/Aspiration during swallow;Reduced airway/laryngeal  closure;Reduced tongue base retraction;Pharyngeal residue - pyriform;Pharyngeal residue - valleculae Pharyngeal Material enters airway, remains ABOVE vocal cords then ejected out Pharyngeal- Thin Teaspoon -- Pharyngeal -- Pharyngeal- Thin Cup Reduced airway/laryngeal closure;Reduced tongue base retraction;Delayed swallow initiation-pyriform sinuses;Penetration/Aspiration during swallow Pharyngeal Material enters airway, CONTACTS cords and not ejected out Pharyngeal- Thin Straw Penetration/Aspiration during swallow;Reduced tongue base retraction;Delayed swallow initiation-pyriform sinuses;Reduced airway/laryngeal closure Pharyngeal Material enters airway, passes BELOW cords without attempt by patient to eject out (silent aspiration) Pharyngeal- Puree -- Pharyngeal -- Pharyngeal- Mechanical Soft -- Pharyngeal -- Pharyngeal- Regular -- Pharyngeal -- Pharyngeal- Multi-consistency Delayed swallow initiation-vallecula;Reduced tongue base retraction;Reduced airway/laryngeal closure;Pharyngeal residue - pyriform;Pharyngeal residue - valleculae;Penetration/Aspiration during swallow Pharyngeal Material enters airway, passes BELOW cords and not ejected out despite cough attempt by patient Pharyngeal- Pill Delayed swallow initiation-vallecula;Reduced tongue base retraction Pharyngeal -- Pharyngeal Comment --  [facility] IP CERVICAL ESOPHAGEAL PHASE 02/25/2018 Cervical Esophageal Phase Impaired Pudding Teaspoon -- Pudding Cup -- Honey Teaspoon -- Honey Cup -- Nectar Teaspoon -- Nectar Cup -- Nectar Straw -- Thin Teaspoon -- Thin Cup -- Thin Straw -- Puree -- Mechanical Soft -- Regular -- Multi-consistency -- Pill -- Cervical Esophageal Comment decreased UES relaxation No flowsheet data found. Page, Melanee SpryNicole L 02/25/2018, 10:38 AM              Mr Maxine GlennMra Head Wo Contrast  Result Date: 02/24/2018 CLINICAL DATA:  Initial evaluation for acute left-sided weakness, slurred speech, left facial droop. EXAM: MRI HEAD WITHOUT CONTRAST MRA HEAD  WITHOUT CONTRAST MRA NECK WITHOUT CONTRAST TECHNIQUE: Multiplanar, multiecho pulse sequences of the brain and surrounding structures were obtained without intravenous contrast. Angiographic images of the Circle of Willis were obtained using MRA technique without intravenous contrast. Angiographic images of the  neck were obtained using MRA technique without intravenous contrast. Carotid stenosis measurements (when applicable) are obtained utilizing NASCET criteria, using the distal internal carotid diameter as the denominator. COMPARISON:  Prior CT from 02/22/2018. FINDINGS: MRI HEAD FINDINGS Brain: Diffuse prominence of the CSF containing spaces is compatible with generalized age-related cerebral atrophy. Remote lacunar infarcts involving the right basal ganglia and left thalamus noted. Patchy T2/FLAIR hyperintensity involving the periventricular, deep, and subcortical white matter both cerebral hemispheres most consistent with chronic small vessel ischemic disease. Changes are overall moderate in nature. Patchy small volume diffusion abnormality seen involving the left thalamus/posterior limb of the left internal capsule, consistent with acute ischemic infarct (series 5001, image 61, 60, 59). Additional 5 mm acute ischemic cortical infarct present within the left occipital lobe (series 5001, image 57). Mild diffusion abnormality seen involving the mesial left temporal lobe/left hippocampus (series 5001, image 58). Changes primarily involve the left PCA territory. No associated hemorrhage or mass effect. No other evidence for acute or subacute ischemia. Gray-white matter differentiation otherwise maintained. No foci of susceptibility artifact to suggest acute or chronic intracranial hemorrhage. No mass lesion, midline shift or mass effect. No hydrocephalus. No extra-axial fluid collection. Major dural sinuses grossly patent. Pituitary gland suprasellar region normal. Midline structures intact and normal. Vascular:  Major intracranial vascular flow voids are maintained. Skull and upper cervical spine: Craniocervical junction normal. Upper cervical spine within normal limits. Bone marrow signal intensity normal. No scalp soft tissue abnormality. Sinuses/Orbits: Globes and orbital soft tissues within normal limits. Patient status post cataract extraction bilaterally. Paranasal sinuses are largely clear. No mastoid effusion. Inner ear structures grossly normal. Other: None. MRA HEAD FINDINGS ANTERIOR CIRCULATION: Distal cervical segments of the internal carotid arteries are patent with antegrade flow. Petrous segments widely patent bilaterally. Scattered atheromatous irregularity within the cavernous/supraclinoid ICAs bilaterally. Secondary moderate stenosis of approximately 50% at the supraclinoid right ICA (series 1001, image 100). No other significant flow-limiting stenosis. 4 mm right paraophthalmic aneurysm noted (series 1001, image 88). ICA termini patent bilaterally. Atheromatous irregularity with moderate to severe stenoses present within the left A1 segment. Right A1 segment irregular but widely patent. Short-segment moderate to severe left A2 stenosis noted. There is severe near occlusive stenosis of the proximal right A2 segment (series 1001, image 129). Scant irregular flow seen distally within the right A2 segment. Extensive atheromatous irregularity throughout the left M1 segment with moderate proximal and mid left M1 stenoses. Short-segment severe proximal left M2 stenoses noted. Extensive atheromatous irregularity throughout the left MCA branches distally. Right M1 segment patent without stenosis or occlusion. There appears to be a proximal right M2 occlusion, inferior division (series 1035001, image 12). Extensive atheromatous irregularity throughout the remainder of the right MCA branches which do remain patent. POSTERIOR CIRCULATION: Vertebral arteries are code dominant. Left vertebral artery patent without  flow-limiting stenosis. Severe proximal right V4 stenosis noted. Posterior inferior cerebral arteries patent proximally. Basilar artery patent to its distal aspect without flow-limiting stenosis. Superior cerebral arteries patent bilaterally. Short-segment severe bilateral P1 stenoses noted. Prominent bilateral posterior communicating arteries noted. Severe proximal left P2 stenosis with near occlusion. Scant flow seen distally within the left PCA. Additional severe near occlusive distal right P2 stenosis. MRA NECK FINDINGS Examination somewhat technically limited by lack of IV contrast. Visualized aortic arch of normal caliber with normal branch pattern. No flow-limiting stenosis about the origin of the great vessels. Visualized subclavian arteries widely patent bilaterally. Right common carotid artery patent from its origin to the bifurcation without stenosis.  Mild atheromatous irregularity about the right carotid bifurcation without flow-limiting stenosis. Right ICA mildly tortuous but widely patent distally to the skull base without stenosis or occlusion. Left common carotid artery tortuous proximally but widely patent to the bifurcation without stenosis. Mild atheromatous irregularity about the left bifurcation without flow-limiting stenosis. Left ICA tortuous but widely patent distally to the skull base without stenosis or occlusion. Left vertebral artery arises separately from the aortic arch. Vertebral arteries widely patent within the neck without stenosis or occlusion. IMPRESSION: MRI HEAD IMPRESSION: 1. Patchy small volume acute ischemic nonhemorrhagic infarct involving the left thalamus, left occipital lobe, and mesial left temporal lobe, left PCA territory. 2. Remote lacunar infarcts involving the right basal ganglia and left thalamus. 3. Moderate chronic small vessel ischemic disease. MRA HEAD IMPRESSION: 1. Extensive widespread intracranial atherosclerotic change throughout the intracranial  circulation as above. Notable findings include moderate left M1 with severe left M2 stenoses, severe near occlusive right A2 stenosis, chronic right M2 occlusion, severe right V4 stenosis, and severe near occlusive bilateral P1 and P2 stenoses. 2. 4 mm right paraophthalmic aneurysm. MRA NECK IMPRESSION: Negative MRA of the neck. No high-grade or critical flow limiting stenosis within either carotid artery system. Vertebral arteries widely patent within the neck. Electronically Signed   By: Rise Mu M.D.   On: 02/24/2018 17:46    Assessment/Plan: Diagnosis: Left thalamic left occipital infarcts in a patient with intracranial stenosis with resultant dysphagia, gait disorder and decline in functional abilities 1. Does the need for close, 24 hr/day medical supervision in concert with the patient's rehab needs make it unreasonable for this patient to be served in a less intensive setting? Yes 2. Co-Morbidities requiring supervision/potential complications: Diabetes, sick sinus syndrome, diastolic congestive heart failure, at risk for aspiration pneumonia 3. Due to bladder management, bowel management, safety, skin/wound care, disease management, medication administration, pain management and patient education, does the patient require 24 hr/day rehab nursing? Yes 4. Does the patient require coordinated care of a physician, rehab nurse, PT (1-2 hrs/day, 5 days/week), OT (1-2 hrs/day, 5 days/week) and SLP (.5-1 hrs/day, 5 days/week) to address physical and functional deficits in the context of the above medical diagnosis(es)? Yes Addressing deficits in the following areas: balance, endurance, locomotion, strength, transferring, bowel/bladder control, bathing, dressing, feeding, grooming, toileting, cognition, swallowing and psychosocial support 5. Can the patient actively participate in an intensive therapy program of at least 3 hrs of therapy per day at least 5 days per week? Yes 6. The potential  for patient to make measurable gains while on inpatient rehab is good 7. Anticipated functional outcomes upon discharge from inpatient rehab are supervision and min assist  with PT, supervision and min assist with OT, modified independent and supervision with SLP. 8. Estimated rehab length of stay to reach the above functional goals is: 14-17 days 9. Anticipated D/C setting: Home 10. Anticipated post D/C treatments: HH therapy 11. Overall Rehab/Functional Prognosis: good  RECOMMENDATIONS: This patient's condition is appropriate for continued rehabilitative care in the following setting: If assisted living can provide mobility assistance and increased supervision then recommend CIR Patient has agreed to participate in recommended program. Yes Note that insurance prior authorization may be required for reimbursement for recommended care.  Comment: Consider lower extremity vascular studies to evaluate lower extremity pain   Charlton Amor, PA-C 02/26/2018

## 2018-02-26 NOTE — H&P (Signed)
Physical Medicine and Rehabilitation Admission H&P    Chief Complaint  Patient presents with  . Altered Mental Status  : HPI: Ms. Charlene Greene is a 84 year old right-handed female with history of CVA 2017 maintained on aspirin and Plavix, diabetes mellitus, hypertension, sick sinus syndrome with pacemaker, diastolic congestive heart failure.  Patient received inpatient rehab service November 2017 for right basal ganglia infarction secondary to small vessel disease.  Per chart review and patient, she is a resident of assisted living facility [facility].  She does receive some assistance with bathing and dressing.  Her meals are provided.  She uses a walker and a wheelchair at the assisted living facility.  Presented 02/22/2018 with altered mental status.  Cranial CT reviewed, unremarkable for acute intracranial process. MRI showed patchy small volume acute ischemic nonhemorrhagic infarction involving the left thalamus, left occipital lobe and left temporal lobe, left PCA territory.  MRA of the neck was negative.  MRI of the head with extensive atherosclerotic changes.  Severe near occlusion right A2 stenosis, right chronic M2 occlusion.  Echocardiogram with ejection fraction of 03% grade 1 diastolic dysfunction.  Neurology consulted presently on aspirin and Plavix therapy times 3 months then aspirin alone.  Cardiology follow-up pacemaker interrogated showed no signs of A. fib.  Presently on a dysphagia #3 nectar thick liquid diet.  Patient with complaints of hip pain x-rays unremarkable.  Physical therapy evaluation completed with recommendations of physical medicine rehab consult.  Patient was admitted for a comprehensive rehab program.  Review of Systems  Constitutional: Negative for chills and fever.  HENT: Negative for hearing loss.   Eyes: Negative for blurred vision and double vision.  Respiratory: Positive for shortness of breath.   Cardiovascular: Positive for leg swelling.    Gastrointestinal: Positive for constipation. Negative for nausea and vomiting.  Genitourinary: Negative for flank pain and hematuria.  Musculoskeletal: Positive for joint pain and myalgias.  Skin: Negative for rash.  Neurological: Positive for speech change and focal weakness.  All other systems reviewed and are negative.  Past Medical History:  Diagnosis Date  . CAD (coronary artery disease)   . Diabetes mellitus without complication ([place])   . Hyperlipidemia   . Hypertension   . Pacemaker   . Renal artery stenosis ([facility])    s/p bilateral stents  . Sick sinus syndrome ([place])   . Stroke [facility])    2 previous cva's   Past Surgical History:  Procedure Laterality Date  . PACEMAKER PLACEMENT  05/15/2014   MDT Advisa MRI implanted at [facility] for sick sinus syndrome   Family History  Problem Relation Age of Onset  . Lung cancer Sister   . Hypertension Other   . CAD Neg Hx   . Stroke Neg Hx    Social History:  reports that she has never smoked. She has never used smokeless tobacco. She reports that she does not drink alcohol or use drugs. Allergies:  Allergies  Allergen Reactions  . Statins Other (See Comments)    Intol to numerous statins still feels has some muscle weakness, myalgia  . Other Other (See Comments)    Unknown reaction to dye (listed on [facility] 02/22/18)  . Amlodipine Other (See Comments)    unknown  . Celecoxib Nausea Only and Other (See Comments)  . Codeine Other (See Comments)    unknown  . Naproxen Other (See Comments)    unknown  . Piroxicam Other (See Comments)    unknown   Medications Prior to Admission  Medication Sig Dispense Refill  . acetaminophen (TYLENOL) 500 MG tablet Take 500 mg by mouth 2 (two) times daily.    Marland Kitchen amLODipine (NORVASC) 5 MG tablet Take 5 mg by mouth 2 (two) times daily.    Marland Kitchen aspirin 325 MG tablet Take 325 mg by mouth daily.    . busPIRone (BUSPAR) 5 MG tablet Take 1 tablet (5 mg total) by mouth 2 (two) times daily. 60 tablet 0  .  cholecalciferol (VITAMIN D) 1000 units tablet Take 1,000 Units by mouth daily.    . fenofibrate 160 MG tablet Take 1 tablet (160 mg total) by mouth daily. 30 tablet 0  . furosemide (LASIX) 20 MG tablet Take 10 mg by mouth daily.    Marland Kitchen gabapentin (NEURONTIN) 100 MG capsule Take 200 mg by mouth at bedtime.    Marland Kitchen Hexylresorcinol (THROAT LOZENGES MT) Use as directed 1 lozenge in the mouth or throat every 6 (six) hours as needed (sore throat (max 4 tabs/24 hours)).    Marland Kitchen insulin aspart (NOVOLOG) 100 UNIT/ML injection Inject 2-12 Units into the skin 4 (four) times daily as needed for high blood sugar (CBG >200). Per sliding scale: CBG 201-250 2 units, 251-300 4 units, 301-350 6 units, 351-400 8 units, 401-450 10 units, 451-500 12 units, >500 call 911    . isosorbide mononitrate (IMDUR) 60 MG 24 hr tablet Take 1 tablet (60 mg total) by mouth daily. 30 tablet 1  . lisinopril (PRINIVIL,ZESTRIL) 40 MG tablet Take 1 tablet (40 mg total) by mouth 2 (two) times daily. (Patient taking differently: Take 40 mg by mouth daily. ) 60 tablet 1  . Magnesium 250 MG TABS Take 250 mg by mouth at bedtime.    . metoprolol succinate (TOPROL-XL) 100 MG 24 hr tablet Take 1 tablet (100 mg total) by mouth daily. 30 tablet 1  . nitroGLYCERIN (NITROSTAT) 0.4 MG SL tablet Place 0.4 mg under the tongue every 5 (five) minutes as needed for chest pain (max 3 doses in 15 minutes).    . psyllium (REGULOID) 0.52 g capsule Take 0.52 g by mouth at bedtime.    . ranolazine (RANEXA) 500 MG 12 hr tablet Take 1 tablet (500 mg total) by mouth 2 (two) times daily. 60 tablet 1  . senna (SENOKOT) 8.6 MG TABS tablet Take 1 tablet by mouth daily as needed (constipation).    Marland Kitchen amLODipine (NORVASC) 10 MG tablet Take 1 tablet (10 mg total) by mouth 2 (two) times daily. (Patient not taking: Reported on 02/22/2018) 60 tablet 1  . clopidogrel (PLAVIX) 75 MG tablet Take 1 tablet (75 mg total) by mouth daily. (Patient not taking: Reported on 02/22/2018) 30 tablet 1   . ezetimibe (ZETIA) 10 MG tablet Take 1 tablet (10 mg total) by mouth daily. (Patient not taking: Reported on 02/22/2018) 30 tablet 1  . furosemide (LASIX) 40 MG tablet Take 1 tablet (40 mg total) by mouth 2 (two) times daily. (Patient not taking: Reported on 02/22/2018) 60 tablet 1  . glimepiride (AMARYL) 2 MG tablet Take 1 tablet (2 mg total) by mouth 2 (two) times daily with a meal. (Patient not taking: Reported on 02/22/2018) 60 tablet 1  . hydrALAZINE (APRESOLINE) 25 MG tablet Take 3 tablets (75 mg total) by mouth every 8 (eight) hours. (Patient not taking: Reported on 02/22/2018) 90 tablet 0  . Multiple Vitamins-Minerals (PRESERVISION AREDS 2) CAPS Take 1 capsule by mouth 2 (two) times daily. (Patient not taking: Reported on 02/22/2018) 60 capsule 0  .  niacin 250 MG tablet Take 1 tablet (250 mg total) by mouth every morning. (Patient not taking: Reported on 02/22/2018) 30 tablet 1  . potassium chloride SA (K-DUR,KLOR-CON) 20 MEQ tablet Take 1 tablet (20 mEq total) by mouth 2 (two) times daily. (Patient not taking: Reported on 02/22/2018) 60 tablet 1  . pregabalin (LYRICA) 75 MG capsule Take 1 capsule (75 mg total) by mouth 2 (two) times daily. (Patient not taking: Reported on 02/22/2018) 60 capsule 1  . senna-docusate (SENOKOT-S) 8.6-50 MG tablet Take 2 tablets by mouth 2 (two) times daily. (Patient not taking: Reported on 02/22/2018)      Drug Regimen Review Drug regimen was reviewed and remains appropriate with no significant issues identified  Home: Home Living Family/patient expects to be discharged to:: Assisted living Type of Home: Assisted living Home Equipment: Gilford Rile - 4 wheels, Wheelchair - manual Additional Comments: Lives in assisted living facility with assistance for bathing and dressing per pt report. Makes small snacks for herself but has meals provided.    Functional History: Prior Function Level of Independence: Needs assistance Gait / Transfers Assistance Needed: rollator  for ambulation in apartment. Lift chair to assist with transfers. Staff uses w/c to transport to dining room. ADL's / Homemaking Assistance Needed: Staff assists with dressing and bathing. Comments: Son and daughter in law provided above information.  Functional Status:  Mobility: Bed Mobility Overal bed mobility: Needs Assistance Bed Mobility: Supine to Sit Supine to sit: Min guard, HOB elevated Sit to supine: Mod assist, HOB elevated General bed mobility comments: cues for sequencing and use of rail; min guard for safety Transfers Overall transfer level: Needs assistance Equipment used: Rolling walker (2 wheeled) Transfers: Sit to/from Stand Sit to Stand: Mod assist, Min assist Stand pivot transfers: Mod assist General transfer comment: cues for hand placement and assistance to power up into standing and gain balance upon stand; from EOB and recliner; increased time and cues needed to transition L hand placement to RW Ambulation/Gait Ambulation/Gait assistance: Mod assist, +2 safety/equipment(chair follow) Ambulation Distance (Feet): (49f X2) Assistive device: Rolling walker (2 wheeled) Gait Pattern/deviations: Step-to pattern, Decreased step length - left, Decreased dorsiflexion - left, Trunk flexed, Decreased stance time - left General Gait Details: cues for sequencing and safe proximity to RW; assistance required for balance and weight shifting however pt able to manage RW more independently this session; L LE weakness and buckling noted with fatigue    ADL: ADL Overall ADL's : Needs assistance/impaired Eating/Feeding: Sitting, Supervision/ safety Grooming: Sitting, Min guard Upper Body Bathing: Minimal assistance, Sitting Lower Body Bathing: Moderate assistance, Sit to/from stand Upper Body Dressing : Sitting, Minimal assistance Lower Body Dressing: Moderate assistance, Sit to/from stand Toilet Transfer: Moderate assistance, Minimal assistance, RW, Ambulation Toilet  Transfer Details (indicate cue type and reason): Mod assist to power up to standing and min assist once ambulating.  Toileting- Clothing Manipulation and Hygiene: Moderate assistance, Sit to/from stand Toileting - Clothing Manipulation Details (indicate cue type and reason): Assist for thoroughness Functional mobility during ADLs: Minimal assistance, Rolling walker, Moderate assistance General ADL Comments: Pt with decreased activity tolerance for ADL and decreased dynamic balance impacting ADL participation. Did note L UE weakness also impacting functionality.   Cognition: Cognition Overall Cognitive Status: Impaired/Different from baseline Arousal/Alertness: Awake/alert Orientation Level: Oriented X4 Attention: Selective Selective Attention: Appears intact Memory: (impaired at baseline) Problem Solving: Impaired Problem Solving Impairment: Functional basic Cognition Arousal/Alertness: Awake/alert Behavior During Therapy: WFL for tasks assessed/performed Overall Cognitive Status: Impaired/Different from baseline  Area of Impairment: Memory, Following commands, Safety/judgement, Awareness, Problem solving, Attention Orientation Level: Disoriented to, Time Current Attention Level: Sustained Memory: Decreased short-term memory Following Commands: Follows one step commands consistently, Follows one step commands with increased time Safety/Judgement: Decreased awareness of safety, Decreased awareness of deficits Awareness: Emergent Problem Solving: Slow processing, Requires verbal cues, Decreased initiation, Difficulty sequencing General Comments: son present during session; pt is not at baseline cognition per son and continue to have delayed responses   Physical Exam: Blood pressure (!) 145/69, pulse 60, temperature 97.8 F (36.6 C), temperature source Oral, resp. rate 17, height '5\' 5"'  (1.651 m), weight 75.9 kg (167 lb 5.3 oz), SpO2 96 %. Physical Exam  Vitals reviewed. Constitutional:  She appears well-developed and well-nourished.  HENT:  Head: Normocephalic and atraumatic.  Eyes: EOM are normal. Right eye exhibits no discharge. Left eye exhibits no discharge.  Neck: Normal range of motion. Neck supple. No tracheal deviation present. No thyromegaly present.  Cardiovascular:  Cardiac rate controlled  Respiratory: Effort normal and breath sounds normal. No respiratory distress.  GI: Soft. Bowel sounds are normal. She exhibits no distension.  Musculoskeletal:  LE edema and tenderness  Neurological: She is alert.  Made good eye contact with examiner.   Fair awareness of deficits.   Follow commands.   Sensation intact light touch  Motor: RUE/RLE: 4-/5 proximal to distal LUE/LLE: 4-/5 proximal to distal (stronger than right)  Skin: Skin is warm and dry.  Psychiatric: She has a normal mood and affect. Her behavior is normal.    Results for orders placed or performed during the hospital encounter of 02/22/18 (from the past 48 hour(s))  Glucose, capillary     Status: Abnormal   Collection Time: 02/24/18  5:23 PM  Result Value Ref Range   Glucose-Capillary 166 (H) 65 - 99 mg/dL   Comment 1 Notify RN    Comment 2 Document in Chart   Glucose, capillary     Status: Abnormal   Collection Time: 02/24/18  9:59 PM  Result Value Ref Range   Glucose-Capillary 183 (H) 65 - 99 mg/dL  Glucose, capillary     Status: Abnormal   Collection Time: 02/25/18  6:08 AM  Result Value Ref Range   Glucose-Capillary 125 (H) 65 - 99 mg/dL   Comment 1 Notify RN    Comment 2 Document in Chart   Glucose, capillary     Status: Abnormal   Collection Time: 02/25/18 11:40 AM  Result Value Ref Range   Glucose-Capillary 245 (H) 65 - 99 mg/dL   Comment 1 Notify RN    Comment 2 Document in Chart   Glucose, capillary     Status: Abnormal   Collection Time: 02/25/18  4:13 PM  Result Value Ref Range   Glucose-Capillary 170 (H) 65 - 99 mg/dL   Comment 1 Notify RN    Comment 2 Document in Chart     Glucose, capillary     Status: Abnormal   Collection Time: 02/25/18  9:23 PM  Result Value Ref Range   Glucose-Capillary 218 (H) 65 - 99 mg/dL   Comment 1 Notify RN    Comment 2 Document in Chart   Basic metabolic panel     Status: Abnormal   Collection Time: 02/26/18  5:46 AM  Result Value Ref Range   Sodium 140 135 - 145 mmol/L   Potassium 3.6 3.5 - 5.1 mmol/L   Chloride 103 101 - 111 mmol/L   CO2 28 22 - 32 mmol/L  Glucose, Bld 153 (H) 65 - 99 mg/dL   BUN 16 6 - 20 mg/dL   Creatinine, Ser 0.83 0.44 - 1.00 mg/dL   Calcium 9.1 8.9 - 10.3 mg/dL   GFR calc non Af Amer 59 (L) >60 mL/min   GFR calc Af Amer >60 >60 mL/min    Comment: (NOTE) The eGFR has been calculated using the CKD EPI equation. This calculation has not been validated in all clinical situations. eGFR's persistently <60 mL/min signify possible Chronic Kidney Disease.    Anion gap 9 5 - 15    Comment: Performed at [facility] [address] [place], [place] [address]  Glucose, capillary     Status: Abnormal   Collection Time: 02/26/18  6:06 AM  Result Value Ref Range   Glucose-Capillary 137 (H) 65 - 99 mg/dL   Comment 1 Notify RN    Comment 2 Document in Chart   Glucose, capillary     Status: Abnormal   Collection Time: 02/26/18 12:05 PM  Result Value Ref Range   Glucose-Capillary 214 (H) 65 - 99 mg/dL  Glucose, capillary     Status: Abnormal   Collection Time: 02/26/18 12:11 PM  Result Value Ref Range   Glucose-Capillary 190 (H) 65 - 99 mg/dL   Comment 1 Notify RN    Comment 2 Document in Chart    Dg Chest 2 View  Result Date: 02/25/2018 CLINICAL DATA:  Cough and congestion EXAM: CHEST - 2 VIEW COMPARISON:  02/22/2018 FINDINGS: Left-sided pacing device as before. No significant pleural effusion. Stable cardiomediastinal silhouette with aortic atherosclerosis. No pneumothorax. Small calcified loose bodies at the shoulders. IMPRESSION: No active cardiopulmonary disease.  Borderline to mild  cardiomegaly. Electronically Signed   By: Donavan Foil M.D.   On: 02/25/2018 19:59   Mr Jodene Nam Neck W Wo Contrast  Result Date: 02/24/2018 CLINICAL DATA:  Initial evaluation for acute left-sided weakness, slurred speech, left facial droop. EXAM: MRI HEAD WITHOUT CONTRAST MRA HEAD WITHOUT CONTRAST MRA NECK WITHOUT CONTRAST TECHNIQUE: Multiplanar, multiecho pulse sequences of the brain and surrounding structures were obtained without intravenous contrast. Angiographic images of the Circle of Willis were obtained using MRA technique without intravenous contrast. Angiographic images of the neck were obtained using MRA technique without intravenous contrast. Carotid stenosis measurements (when applicable) are obtained utilizing NASCET criteria, using the distal internal carotid diameter as the denominator. COMPARISON:  Prior CT from 02/22/2018. FINDINGS: MRI HEAD FINDINGS Brain: Diffuse prominence of the CSF containing spaces is compatible with generalized age-related cerebral atrophy. Remote lacunar infarcts involving the right basal ganglia and left thalamus noted. Patchy T2/FLAIR hyperintensity involving the periventricular, deep, and subcortical white matter both cerebral hemispheres most consistent with chronic small vessel ischemic disease. Changes are overall moderate in nature. Patchy small volume diffusion abnormality seen involving the left thalamus/posterior limb of the left internal capsule, consistent with acute ischemic infarct (series 5001, image 61, 60, 59). Additional 5 mm acute ischemic cortical infarct present within the left occipital lobe (series 5001, image 57). Mild diffusion abnormality seen involving the mesial left temporal lobe/left hippocampus (series 5001, image 58). Changes primarily involve the left PCA territory. No associated hemorrhage or mass effect. No other evidence for acute or subacute ischemia. Gray-white matter differentiation otherwise maintained. No foci of susceptibility  artifact to suggest acute or chronic intracranial hemorrhage. No mass lesion, midline shift or mass effect. No hydrocephalus. No extra-axial fluid collection. Major dural sinuses grossly patent. Pituitary gland suprasellar region normal. Midline structures intact  and normal. Vascular: Major intracranial vascular flow voids are maintained. Skull and upper cervical spine: Craniocervical junction normal. Upper cervical spine within normal limits. Bone marrow signal intensity normal. No scalp soft tissue abnormality. Sinuses/Orbits: Globes and orbital soft tissues within normal limits. Patient status post cataract extraction bilaterally. Paranasal sinuses are largely clear. No mastoid effusion. Inner ear structures grossly normal. Other: None. MRA HEAD FINDINGS ANTERIOR CIRCULATION: Distal cervical segments of the internal carotid arteries are patent with antegrade flow. Petrous segments widely patent bilaterally. Scattered atheromatous irregularity within the cavernous/supraclinoid ICAs bilaterally. Secondary moderate stenosis of approximately 50% at the supraclinoid right ICA (series 1001, image 100). No other significant flow-limiting stenosis. 4 mm right paraophthalmic aneurysm noted (series 1001, image 88). ICA termini patent bilaterally. Atheromatous irregularity with moderate to severe stenoses present within the left A1 segment. Right A1 segment irregular but widely patent. Short-segment moderate to severe left A2 stenosis noted. There is severe near occlusive stenosis of the proximal right A2 segment (series 1001, image 129). Scant irregular flow seen distally within the right A2 segment. Extensive atheromatous irregularity throughout the left M1 segment with moderate proximal and mid left M1 stenoses. Short-segment severe proximal left M2 stenoses noted. Extensive atheromatous irregularity throughout the left MCA branches distally. Right M1 segment patent without stenosis or occlusion. There appears to be a  proximal right M2 occlusion, inferior division (series 1035001, image 12). Extensive atheromatous irregularity throughout the remainder of the right MCA branches which do remain patent. POSTERIOR CIRCULATION: Vertebral arteries are code dominant. Left vertebral artery patent without flow-limiting stenosis. Severe proximal right V4 stenosis noted. Posterior inferior cerebral arteries patent proximally. Basilar artery patent to its distal aspect without flow-limiting stenosis. Superior cerebral arteries patent bilaterally. Short-segment severe bilateral P1 stenoses noted. Prominent bilateral posterior communicating arteries noted. Severe proximal left P2 stenosis with near occlusion. Scant flow seen distally within the left PCA. Additional severe near occlusive distal right P2 stenosis. MRA NECK FINDINGS Examination somewhat technically limited by lack of IV contrast. Visualized aortic arch of normal caliber with normal branch pattern. No flow-limiting stenosis about the origin of the great vessels. Visualized subclavian arteries widely patent bilaterally. Right common carotid artery patent from its origin to the bifurcation without stenosis. Mild atheromatous irregularity about the right carotid bifurcation without flow-limiting stenosis. Right ICA mildly tortuous but widely patent distally to the skull base without stenosis or occlusion. Left common carotid artery tortuous proximally but widely patent to the bifurcation without stenosis. Mild atheromatous irregularity about the left bifurcation without flow-limiting stenosis. Left ICA tortuous but widely patent distally to the skull base without stenosis or occlusion. Left vertebral artery arises separately from the aortic arch. Vertebral arteries widely patent within the neck without stenosis or occlusion. IMPRESSION: MRI HEAD IMPRESSION: 1. Patchy small volume acute ischemic nonhemorrhagic infarct involving the left thalamus, left occipital lobe, and mesial left  temporal lobe, left PCA territory. 2. Remote lacunar infarcts involving the right basal ganglia and left thalamus. 3. Moderate chronic small vessel ischemic disease. MRA HEAD IMPRESSION: 1. Extensive widespread intracranial atherosclerotic change throughout the intracranial circulation as above. Notable findings include moderate left M1 with severe left M2 stenoses, severe near occlusive right A2 stenosis, chronic right M2 occlusion, severe right V4 stenosis, and severe near occlusive bilateral P1 and P2 stenoses. 2. 4 mm right paraophthalmic aneurysm. MRA NECK IMPRESSION: Negative MRA of the neck. No high-grade or critical flow limiting stenosis within either carotid artery system. Vertebral arteries widely patent within the neck. Electronically Signed  By: Jeannine Boga M.D.   On: 02/24/2018 17:46   Mr Brain Wo Contrast  Result Date: 02/24/2018 CLINICAL DATA:  Initial evaluation for acute left-sided weakness, slurred speech, left facial droop. EXAM: MRI HEAD WITHOUT CONTRAST MRA HEAD WITHOUT CONTRAST MRA NECK WITHOUT CONTRAST TECHNIQUE: Multiplanar, multiecho pulse sequences of the brain and surrounding structures were obtained without intravenous contrast. Angiographic images of the Circle of Willis were obtained using MRA technique without intravenous contrast. Angiographic images of the neck were obtained using MRA technique without intravenous contrast. Carotid stenosis measurements (when applicable) are obtained utilizing NASCET criteria, using the distal internal carotid diameter as the denominator. COMPARISON:  Prior CT from 02/22/2018. FINDINGS: MRI HEAD FINDINGS Brain: Diffuse prominence of the CSF containing spaces is compatible with generalized age-related cerebral atrophy. Remote lacunar infarcts involving the right basal ganglia and left thalamus noted. Patchy T2/FLAIR hyperintensity involving the periventricular, deep, and subcortical white matter both cerebral hemispheres most consistent  with chronic small vessel ischemic disease. Changes are overall moderate in nature. Patchy small volume diffusion abnormality seen involving the left thalamus/posterior limb of the left internal capsule, consistent with acute ischemic infarct (series 5001, image 61, 60, 59). Additional 5 mm acute ischemic cortical infarct present within the left occipital lobe (series 5001, image 57). Mild diffusion abnormality seen involving the mesial left temporal lobe/left hippocampus (series 5001, image 58). Changes primarily involve the left PCA territory. No associated hemorrhage or mass effect. No other evidence for acute or subacute ischemia. Gray-white matter differentiation otherwise maintained. No foci of susceptibility artifact to suggest acute or chronic intracranial hemorrhage. No mass lesion, midline shift or mass effect. No hydrocephalus. No extra-axial fluid collection. Major dural sinuses grossly patent. Pituitary gland suprasellar region normal. Midline structures intact and normal. Vascular: Major intracranial vascular flow voids are maintained. Skull and upper cervical spine: Craniocervical junction normal. Upper cervical spine within normal limits. Bone marrow signal intensity normal. No scalp soft tissue abnormality. Sinuses/Orbits: Globes and orbital soft tissues within normal limits. Patient status post cataract extraction bilaterally. Paranasal sinuses are largely clear. No mastoid effusion. Inner ear structures grossly normal. Other: None. MRA HEAD FINDINGS ANTERIOR CIRCULATION: Distal cervical segments of the internal carotid arteries are patent with antegrade flow. Petrous segments widely patent bilaterally. Scattered atheromatous irregularity within the cavernous/supraclinoid ICAs bilaterally. Secondary moderate stenosis of approximately 50% at the supraclinoid right ICA (series 1001, image 100). No other significant flow-limiting stenosis. 4 mm right paraophthalmic aneurysm noted (series 1001, image  88). ICA termini patent bilaterally. Atheromatous irregularity with moderate to severe stenoses present within the left A1 segment. Right A1 segment irregular but widely patent. Short-segment moderate to severe left A2 stenosis noted. There is severe near occlusive stenosis of the proximal right A2 segment (series 1001, image 129). Scant irregular flow seen distally within the right A2 segment. Extensive atheromatous irregularity throughout the left M1 segment with moderate proximal and mid left M1 stenoses. Short-segment severe proximal left M2 stenoses noted. Extensive atheromatous irregularity throughout the left MCA branches distally. Right M1 segment patent without stenosis or occlusion. There appears to be a proximal right M2 occlusion, inferior division (series [id number], image 12). Extensive atheromatous irregularity throughout the remainder of the right MCA branches which do remain patent. POSTERIOR CIRCULATION: Vertebral arteries are code dominant. Left vertebral artery patent without flow-limiting stenosis. Severe proximal right V4 stenosis noted. Posterior inferior cerebral arteries patent proximally. Basilar artery patent to its distal aspect without flow-limiting stenosis. Superior cerebral arteries patent bilaterally. Short-segment severe bilateral P1 stenoses  noted. Prominent bilateral posterior communicating arteries noted. Severe proximal left P2 stenosis with near occlusion. Scant flow seen distally within the left PCA. Additional severe near occlusive distal right P2 stenosis. MRA NECK FINDINGS Examination somewhat technically limited by lack of IV contrast. Visualized aortic arch of normal caliber with normal branch pattern. No flow-limiting stenosis about the origin of the great vessels. Visualized subclavian arteries widely patent bilaterally. Right common carotid artery patent from its origin to the bifurcation without stenosis. Mild atheromatous irregularity about the right carotid bifurcation  without flow-limiting stenosis. Right ICA mildly tortuous but widely patent distally to the skull base without stenosis or occlusion. Left common carotid artery tortuous proximally but widely patent to the bifurcation without stenosis. Mild atheromatous irregularity about the left bifurcation without flow-limiting stenosis. Left ICA tortuous but widely patent distally to the skull base without stenosis or occlusion. Left vertebral artery arises separately from the aortic arch. Vertebral arteries widely patent within the neck without stenosis or occlusion. IMPRESSION: MRI HEAD IMPRESSION: 1. Patchy small volume acute ischemic nonhemorrhagic infarct involving the left thalamus, left occipital lobe, and mesial left temporal lobe, left PCA territory. 2. Remote lacunar infarcts involving the right basal ganglia and left thalamus. 3. Moderate chronic small vessel ischemic disease. MRA HEAD IMPRESSION: 1. Extensive widespread intracranial atherosclerotic change throughout the intracranial circulation as above. Notable findings include moderate left M1 with severe left M2 stenoses, severe near occlusive right A2 stenosis, chronic right M2 occlusion, severe right V4 stenosis, and severe near occlusive bilateral P1 and P2 stenoses. 2. 4 mm right paraophthalmic aneurysm. MRA NECK IMPRESSION: Negative MRA of the neck. No high-grade or critical flow limiting stenosis within either carotid artery system. Vertebral arteries widely patent within the neck. Electronically Signed   By: Jeannine Boga M.D.   On: 02/24/2018 17:46   Dg Swallowing Func-speech Pathology  Result Date: 02/25/2018 Objective Swallowing Evaluation: Type of Study: MBS-Modified Barium Swallow Study  Patient Details Name: Charlene Greene MRN: [id number] Date of Birth: 1922-07-09 Today's Date: 02/25/2018 Time: SLP Start Time (ACUTE ONLY): 0930 -SLP Stop Time (ACUTE ONLY): 0948 SLP Time Calculation (min) (ACUTE ONLY): 18 min Past Medical History: Past Medical  History: Diagnosis Date . CAD (coronary artery disease)  . Diabetes mellitus without complication ([place])  . Hyperlipidemia  . Hypertension  . Pacemaker  . Renal artery stenosis ([facility])   s/p bilateral stents . Sick sinus syndrome ([place])  . Stroke [facility])   2 previous cva's Past Surgical History: Past Surgical History: Procedure Laterality Date . PACEMAKER PLACEMENT  05/15/2014  MDT Advisa MRI implanted at [facility] for sick sinus syndrome HPI: Charlene Greene is a 84 y.o. female with medical history significant of CVA 2017, DM 2, HTN, sick sinus syndrome, sp pacemaker, diastolic CHF presented with left side weakness, slurred speech and facial droop similar to prior presentation of CVA in 2107. Per chart pt reports 1 week of cough sometimes worse with eating. Patient had dysphasia with prior CVA but it resolved and she is on regular diet. CXR No edema or consolidation. Head CT No acute intracranial abnormality. BSE 09/2016 mild oropharyngeal dysphagia, reg/thin recommended.  Subjective: pt pleasant, cooperative Assessment / Plan / Recommendation CHL IP CLINICAL IMPRESSIONS 02/25/2018 Clinical Impression Pt presents with a mild oropharyngeal dysphagia.  Pt has decreased oral control of boluses in the setting of mild base of tongue weakness which leads to premature spillage and delay in swallow initiation to the level of the pyriforms.  This, in combination with decreased airway closure  during the swallow, lead to penetration with nectar thick liquids (instance of deep penetration on initial sip which improved to flash penetration on subsequent boluses). Pt also had deep penetration with small controlled sips of thin liquids which worsened to aspiration when challenged to take large, consecutive boluses.  Furthermore, pt aspirated thin liquids when consuming mixed consistency boluses.  Aspiration of thins alone was trace in amount and went unsensed by pt; however, larger amounts of aspiration seen following mixed consistency  boluses elicited a strong cough response.  Mild pharyngeal residue was also evident throughout study, suspect due to a combination of decreased UES relaxation and base of tongue weakness.  Taking small sips followed by a second swallow to clear residue appeared to be an effective technique for maximizing airway protection with all consistencies.  For now, recommend that pt remain on dys 3 textures and nectar thick liquids with full supervision for use of swallowing precautions.  Pt may have sips of water in between meals following oral care with full staff supervision per the water protocol.   SLP Visit Diagnosis Dysphagia, oropharyngeal phase (R13.12) Attention and concentration deficit following -- Frontal lobe and executive function deficit following -- Impact on safety and function Mild aspiration risk   CHL IP TREATMENT RECOMMENDATION 02/25/2018 Treatment Recommendations Therapy as outlined in treatment plan below   Prognosis 02/25/2018 Prognosis for Safe Diet Advancement Good Barriers to Reach Goals -- Barriers/Prognosis Comment -- CHL IP DIET RECOMMENDATION 02/25/2018 SLP Diet Recommendations Dysphagia 3 (Mech soft) solids;Nectar thick liquid Liquid Administration via Cup;Straw Medication Administration Crushed with puree Compensations Slow rate;Small sips/bites Postural Changes Seated upright at 90 degrees   CHL IP OTHER RECOMMENDATIONS 02/25/2018 Recommended Consults -- Oral Care Recommendations Oral care BID Other Recommendations --   CHL IP FOLLOW UP RECOMMENDATIONS 02/25/2018 Follow up Recommendations Skilled Nursing facility   [facility] IP FREQUENCY AND DURATION 02/25/2018 Speech Therapy Frequency (ACUTE ONLY) min 2x/week Treatment Duration --      CHL IP ORAL PHASE 02/25/2018 Oral Phase Impaired Oral - Pudding Teaspoon -- Oral - Pudding Cup -- Oral - Honey Teaspoon -- Oral - Honey Cup -- Oral - Nectar Teaspoon -- Oral - Nectar Cup Premature spillage;Decreased bolus cohesion Oral - Nectar Straw Premature spillage  Oral - Thin Teaspoon -- Oral - Thin Cup Premature spillage;Decreased bolus cohesion;Weak lingual manipulation Oral - Thin Straw Premature spillage Oral - Puree -- Oral - Mech Soft -- Oral - Regular -- Oral - Multi-Consistency Premature spillage;Decreased bolus cohesion Oral - Pill Premature spillage;Decreased bolus cohesion Oral Phase - Comment --  CHL IP PHARYNGEAL PHASE 02/25/2018 Pharyngeal Phase Impaired Pharyngeal- Pudding Teaspoon -- Pharyngeal -- Pharyngeal- Pudding Cup -- Pharyngeal -- Pharyngeal- Honey Teaspoon -- Pharyngeal -- Pharyngeal- Honey Cup -- Pharyngeal -- Pharyngeal- Nectar Teaspoon -- Pharyngeal -- Pharyngeal- Nectar Cup Reduced airway/laryngeal closure;Reduced tongue base retraction;Penetration/Aspiration during swallow;Delayed swallow initiation-pyriform sinuses;Pharyngeal residue - valleculae;Pharyngeal residue - pyriform Pharyngeal Material enters airway, CONTACTS cords and not ejected out Pharyngeal- Nectar Straw Delayed swallow initiation-pyriform sinuses;Penetration/Aspiration during swallow;Reduced airway/laryngeal closure;Reduced tongue base retraction;Pharyngeal residue - pyriform;Pharyngeal residue - valleculae Pharyngeal Material enters airway, remains ABOVE vocal cords then ejected out Pharyngeal- Thin Teaspoon -- Pharyngeal -- Pharyngeal- Thin Cup Reduced airway/laryngeal closure;Reduced tongue base retraction;Delayed swallow initiation-pyriform sinuses;Penetration/Aspiration during swallow Pharyngeal Material enters airway, CONTACTS cords and not ejected out Pharyngeal- Thin Straw Penetration/Aspiration during swallow;Reduced tongue base retraction;Delayed swallow initiation-pyriform sinuses;Reduced airway/laryngeal closure Pharyngeal Material enters airway, passes BELOW cords without attempt by patient to eject out (silent aspiration) Pharyngeal- Puree --  Pharyngeal -- Pharyngeal- Mechanical Soft -- Pharyngeal -- Pharyngeal- Regular -- Pharyngeal -- Pharyngeal- Multi-consistency  Delayed swallow initiation-vallecula;Reduced tongue base retraction;Reduced airway/laryngeal closure;Pharyngeal residue - pyriform;Pharyngeal residue - valleculae;Penetration/Aspiration during swallow Pharyngeal Material enters airway, passes BELOW cords and not ejected out despite cough attempt by patient Pharyngeal- Pill Delayed swallow initiation-vallecula;Reduced tongue base retraction Pharyngeal -- Pharyngeal Comment --  CHL IP CERVICAL ESOPHAGEAL PHASE 02/25/2018 Cervical Esophageal Phase Impaired Pudding Teaspoon -- Pudding Cup -- Honey Teaspoon -- Honey Cup -- Nectar Teaspoon -- Nectar Cup -- Nectar Straw -- Thin Teaspoon -- Thin Cup -- Thin Straw -- Puree -- Mechanical Soft -- Regular -- Multi-consistency -- Pill -- Cervical Esophageal Comment decreased UES relaxation No flowsheet data found. Page, Selinda Orion 02/25/2018, 10:38 AM              Mr Jodene Nam Head Wo Contrast  Result Date: 02/24/2018 CLINICAL DATA:  Initial evaluation for acute left-sided weakness, slurred speech, left facial droop. EXAM: MRI HEAD WITHOUT CONTRAST MRA HEAD WITHOUT CONTRAST MRA NECK WITHOUT CONTRAST TECHNIQUE: Multiplanar, multiecho pulse sequences of the brain and surrounding structures were obtained without intravenous contrast. Angiographic images of the Circle of Willis were obtained using MRA technique without intravenous contrast. Angiographic images of the neck were obtained using MRA technique without intravenous contrast. Carotid stenosis measurements (when applicable) are obtained utilizing NASCET criteria, using the distal internal carotid diameter as the denominator. COMPARISON:  Prior CT from 02/22/2018. FINDINGS: MRI HEAD FINDINGS Brain: Diffuse prominence of the CSF containing spaces is compatible with generalized age-related cerebral atrophy. Remote lacunar infarcts involving the right basal ganglia and left thalamus noted. Patchy T2/FLAIR hyperintensity involving the periventricular, deep, and subcortical white matter  both cerebral hemispheres most consistent with chronic small vessel ischemic disease. Changes are overall moderate in nature. Patchy small volume diffusion abnormality seen involving the left thalamus/posterior limb of the left internal capsule, consistent with acute ischemic infarct (series 5001, image 61, 60, 59). Additional 5 mm acute ischemic cortical infarct present within the left occipital lobe (series 5001, image 57). Mild diffusion abnormality seen involving the mesial left temporal lobe/left hippocampus (series 5001, image 58). Changes primarily involve the left PCA territory. No associated hemorrhage or mass effect. No other evidence for acute or subacute ischemia. Gray-white matter differentiation otherwise maintained. No foci of susceptibility artifact to suggest acute or chronic intracranial hemorrhage. No mass lesion, midline shift or mass effect. No hydrocephalus. No extra-axial fluid collection. Major dural sinuses grossly patent. Pituitary gland suprasellar region normal. Midline structures intact and normal. Vascular: Major intracranial vascular flow voids are maintained. Skull and upper cervical spine: Craniocervical junction normal. Upper cervical spine within normal limits. Bone marrow signal intensity normal. No scalp soft tissue abnormality. Sinuses/Orbits: Globes and orbital soft tissues within normal limits. Patient status post cataract extraction bilaterally. Paranasal sinuses are largely clear. No mastoid effusion. Inner ear structures grossly normal. Other: None. MRA HEAD FINDINGS ANTERIOR CIRCULATION: Distal cervical segments of the internal carotid arteries are patent with antegrade flow. Petrous segments widely patent bilaterally. Scattered atheromatous irregularity within the cavernous/supraclinoid ICAs bilaterally. Secondary moderate stenosis of approximately 50% at the supraclinoid right ICA (series 1001, image 100). No other significant flow-limiting stenosis. 4 mm right  paraophthalmic aneurysm noted (series 1001, image 88). ICA termini patent bilaterally. Atheromatous irregularity with moderate to severe stenoses present within the left A1 segment. Right A1 segment irregular but widely patent. Short-segment moderate to severe left A2 stenosis noted. There is severe near occlusive stenosis of the proximal right A2  segment (series 1001, image 129). Scant irregular flow seen distally within the right A2 segment. Extensive atheromatous irregularity throughout the left M1 segment with moderate proximal and mid left M1 stenoses. Short-segment severe proximal left M2 stenoses noted. Extensive atheromatous irregularity throughout the left MCA branches distally. Right M1 segment patent without stenosis or occlusion. There appears to be a proximal right M2 occlusion, inferior division (series 1035001, image 12). Extensive atheromatous irregularity throughout the remainder of the right MCA branches which do remain patent. POSTERIOR CIRCULATION: Vertebral arteries are code dominant. Left vertebral artery patent without flow-limiting stenosis. Severe proximal right V4 stenosis noted. Posterior inferior cerebral arteries patent proximally. Basilar artery patent to its distal aspect without flow-limiting stenosis. Superior cerebral arteries patent bilaterally. Short-segment severe bilateral P1 stenoses noted. Prominent bilateral posterior communicating arteries noted. Severe proximal left P2 stenosis with near occlusion. Scant flow seen distally within the left PCA. Additional severe near occlusive distal right P2 stenosis. MRA NECK FINDINGS Examination somewhat technically limited by lack of IV contrast. Visualized aortic arch of normal caliber with normal branch pattern. No flow-limiting stenosis about the origin of the great vessels. Visualized subclavian arteries widely patent bilaterally. Right common carotid artery patent from its origin to the bifurcation without stenosis. Mild  atheromatous irregularity about the right carotid bifurcation without flow-limiting stenosis. Right ICA mildly tortuous but widely patent distally to the skull base without stenosis or occlusion. Left common carotid artery tortuous proximally but widely patent to the bifurcation without stenosis. Mild atheromatous irregularity about the left bifurcation without flow-limiting stenosis. Left ICA tortuous but widely patent distally to the skull base without stenosis or occlusion. Left vertebral artery arises separately from the aortic arch. Vertebral arteries widely patent within the neck without stenosis or occlusion. IMPRESSION: MRI HEAD IMPRESSION: 1. Patchy small volume acute ischemic nonhemorrhagic infarct involving the left thalamus, left occipital lobe, and mesial left temporal lobe, left PCA territory. 2. Remote lacunar infarcts involving the right basal ganglia and left thalamus. 3. Moderate chronic small vessel ischemic disease. MRA HEAD IMPRESSION: 1. Extensive widespread intracranial atherosclerotic change throughout the intracranial circulation as above. Notable findings include moderate left M1 with severe left M2 stenoses, severe near occlusive right A2 stenosis, chronic right M2 occlusion, severe right V4 stenosis, and severe near occlusive bilateral P1 and P2 stenoses. 2. 4 mm right paraophthalmic aneurysm. MRA NECK IMPRESSION: Negative MRA of the neck. No high-grade or critical flow limiting stenosis within either carotid artery system. Vertebral arteries widely patent within the neck. Electronically Signed   By: Jeannine Boga M.D.   On: 02/24/2018 17:46    Medical Problem List and Plan: 1.  Gait disorder with dysphagia secondary to left thalamic left occipital infarcts as well as intracranial stenosis as well as history of CVA 2017 2.  DVT Prophylaxis/Anticoagulation: SCDs.  Check vascular study 3. Pain Management: Tylenol as needed 4. Mood: Provide emotional support 5. Neuropsych:  This patient is capable of making decisions on her own behalf. 6. Skin/Wound Care: Routine skin checks 7. Fluids/Electrolytes/Nutrition: Routine INO's with follow-up chemistries 8.  Dysphagia.  Dysphasia #3 nectar liquids.  Follow-up speech therapy 9.  Diastolic congestive heart failure.  Monitor for any signs of fluid overload 10.  Sick sinus syndrome status post pacemaker.  Follow-up per cardiology services 11.  Diabetes mellitus peripheral neuropathy.  Hemoglobin A1c 6.9.  SSI.  Check blood sugars before meals and at bedtime.  Patient on Amaryl 2 mg daily prior to admission.  Resume as needed 12.  Hypertension.  Toprol-XL 100 mg daily, Norvasc 5 mg twice daily.  Monitor with increased mobility 13.  Hyperlipidemia.  Zetia  Post Admission Physician Evaluation: 1. Preadmission assessment reviewed and changes made below. 2. Functional deficits secondary  to left thalamic left occipital infarcts as well as intracranial stenosis as well as history of CVA 2017. 3. Patient is admitted to receive collaborative, interdisciplinary care between the physiatrist, rehab nursing staff, and therapy team. 4. Patient's level of medical complexity and substantial therapy needs in context of that medical necessity cannot be provided at a lesser intensity of care such as a SNF. 5. Patient has experienced substantial functional loss from his/her baseline which was documented above under the "Functional History" and "Functional Status" headings.  Judging by the patient's diagnosis, physical exam, and functional history, the patient has potential for functional progress which will result in measurable gains while on inpatient rehab.  These gains will be of substantial and practical use upon discharge  in facilitating mobility and self-care at the household level. 36. Physiatrist will provide 24 hour management of medical needs as well as oversight of the therapy plan/treatment and provide guidance as appropriate regarding  the interaction of the two. 7. 24 hour rehab nursing will assist with safety, disease management and patient education  and help integrate therapy concepts, techniques,education, etc. 8. PT will assess and treat for/with: Lower extremity strength, range of motion, stamina, balance, functional mobility, safety, adaptive techniques and equipment, woundcare, coping skills, pain control, education.   Goals are: Supervision/Min A. 9. OT will assess and treat for/with: ADL's, functional mobility, safety, upper extremity strength, adaptive techniques and equipment, wound mgt, ego support, and community reintegration.   Goals are: Supervision/Min A. Therapy may proceed with showering this patient. 10. SLP will assess and treat for/with: swallowing.  Goals are: Mod I. 11. Case Management and Social Worker will assess and treat for psychological issues and discharge planning. 12. Team conference will be held weekly to assess progress toward goals and to determine barriers to discharge. 13. Patient will receive at least 3 hours of therapy per day at least 5 days per week. 14. ELOS: 14-18 days.       15. Prognosis:  good  Delice Lesch, MD, ABPMR Lavon Paganini Angiulli, PA-C 02/26/2018

## 2018-02-26 NOTE — Care Management Note (Signed)
Case Management Note  Patient Details  Name: Charlene Greene MRN: [id number] Date of Birth: 1922-03-12  Subjective/Objective:                    Action/Plan: Pt is d/cing to CIR today. CM signing off.    Expected Discharge Date:  02/26/18               Expected Discharge Plan:  Skilled Nursing Facility  In-House Referral:  Clinical Social Work  Discharge planning Services     Post Acute Care Choice:    Choice offered to:     DME Arranged:    DME Agency:     HH Arranged:    HH Agency:     Status of Service:  Completed, signed off  If discussed at [employer] Meetings, dates discussed:    Additional Comments:  Kermit BaloKelli F Shyanne Mcclary, RN 02/26/2018, 3:15 PM

## 2018-02-26 NOTE — Plan of Care (Signed)
Pt incontinent of urine  No BM  Pt had tylenol with relief for c/o leg pain

## 2018-02-26 NOTE — Progress Notes (Signed)
Kirsteins, Victorino Sparrow, MD      Wynn Banker Victorino Sparrow, MD  Physician  Physical Medicine and Rehabilitation      Consult Note  Signed     Date of Service:  02/26/2018  9:44 AM         Related encounter: ED to Hosp-Admission (Current) from 02/22/2018 in [place] [facility]             Signed          Expand All Collapse All            Expand widget buttonCollapse widget button     Hide copied text   Hover for detailscustomization button                                                                                                      untitled image              Physical Medicine and Rehabilitation Consult  Reason for Consult: Decreased functional mobility with left-sided weakness, slurred speech and facial droop  Referring Physician: [facility]        HPI: Charlene Greene is a 84 y.o. right-handed female with history of CVA 2017 maintained aspirin and plavix, diabetes mellitus, hypertension, sick sinus syndrome with pacemaker, diastolic congestive heart failure.  Patient received inpatient rehab services November 2017 for right basal ganglia infarction secondary to small vessel disease.  Per chart review patient is a resident of assisted living facility [facility].  Patient does receive assistance for some bathing and dressing.  Her meals are provided.  She uses a walker and wheelchair at the assisted living facility.  Presented 02/22/2018 with altered mental status.Cranial CT negative.  MRI showed patchy small volume acute ischemic nonhemorrhagic infarct involving the left thalamus, left occipital lobe and left temporal lobe, left PCA territory.  MRA of the neck was negative.  MRI of the head with extensive atherosclerotic changes.  Severe near occlusion right A2 stenosis, chronic right M2 occlusion.   Echocardiogram with ejection fraction of 70% grade 1 diastolic dysfunction.  Neurology consulted presently on aspirin and Plavix recommendations for 3 months then aspirin alone.  Cardiology follow-up pacemaker interrogated showing no signs of A. fib.  Presently on a dysphagia #3 nectar thick liquid diet.  Physical therapy evaluation completed.  Family has requested rehab consult for opportunity to return back to assisted living facility.     Patient complains of bilateral lower extremity pain which is fairly constant.  Hip x-rays 02/23/2018 were unremarkable.  She states that she does not feel the pain is in her joints but her entire leg.        Review of Systems   Constitutional: Negative for chills and fever.   HENT: Negative for hearing loss.    Eyes: Negative for blurred vision and double vision.   Respiratory: Positive for shortness of breath. Negative for cough.    Cardiovascular: Positive for palpitations and leg swelling. Negative for chest pain.  Gastrointestinal: Positive for constipation. Negative for nausea and vomiting.   Genitourinary: Negative for dysuria and hematuria.   Musculoskeletal: Positive for myalgias.   Skin: Negative for rash.   Neurological: Positive for speech change and focal weakness.   All other systems reviewed and are negative.          Past Medical History:    Diagnosis   Date    .   CAD (coronary artery disease)        .   Diabetes mellitus without complication (HCC)        .   Hyperlipidemia        .   Hypertension        .   Pacemaker        .   Renal artery stenosis (HCC)            s/p bilateral stents    .   Sick sinus syndrome (HCC)        .   Stroke [facility])            2 previous cva's             Past Surgical History:    Procedure   Laterality   Date    .   PACEMAKER PLACEMENT       05/15/2014        MDT Advisa MRI implanted at [facility] for  sick sinus syndrome             Family History    Problem   Relation   Age of Onset    .   Lung cancer   Sister        .   Hypertension   Other        .   CAD   Neg Hx        .   Stroke   Neg Hx           Social History:  reports that she has never smoked. She has never used smokeless tobacco. She reports that she does not drink alcohol or use drugs.  Allergies:         Allergies    Allergen   Reactions    .   Statins   Other (See Comments)            Intol to numerous statins still feels has some muscle weakness, myalgia    .   Other   Other (See Comments)            Unknown reaction to dye (listed on [facility] 02/22/18)    .   Amlodipine   Other (See Comments)            unknown    .   Celecoxib   Nausea Only and Other (See Comments)    .   Codeine   Other (See Comments)            unknown    .   Naproxen   Other (See Comments)            unknown    .   Piroxicam   Other (See Comments)            unknown              Medications Prior to Admission    Medication   Sig   Dispense   Refill    .   acetaminophen (TYLENOL) 500 MG tablet  Take 500 mg by mouth 2 (two) times daily.            Marland Kitchen   amLODipine (NORVASC) 5 MG tablet   Take 5 mg by mouth 2 (two) times daily.            Marland Kitchen   aspirin 325 MG tablet   Take 325 mg by mouth daily.            .   busPIRone (BUSPAR) 5 MG tablet   Take 1 tablet (5 mg total) by mouth 2 (two) times daily.   60 tablet   0    .   cholecalciferol (VITAMIN D) 1000 units tablet   Take 1,000 Units by mouth daily.            .   fenofibrate 160 MG tablet   Take 1 tablet (160 mg total) by mouth daily.   30 tablet   0    .   furosemide (LASIX) 20 MG tablet   Take 10 mg by mouth daily.            Marland Kitchen   gabapentin (NEURONTIN) 100 MG capsule   Take 200 mg  by mouth at bedtime.            Marland Kitchen   Hexylresorcinol (THROAT LOZENGES MT)   Use as directed 1 lozenge in the mouth or throat every 6 (six) hours as needed (sore throat (max 4 tabs/24 hours)).            Marland Kitchen   insulin aspart (NOVOLOG) 100 UNIT/ML injection   Inject 2-12 Units into the skin 4 (four) times daily as needed for high blood sugar (CBG >200). Per sliding scale: CBG 201-250 2 units, 251-300 4 units, 301-350 6 units, 351-400 8 units, 401-450 10 units, 451-500 12 units, >500 call 911            .   isosorbide mononitrate (IMDUR) 60 MG 24 hr tablet   Take 1 tablet (60 mg total) by mouth daily.   30 tablet   1    .   lisinopril (PRINIVIL,ZESTRIL) 40 MG tablet   Take 1 tablet (40 mg total) by mouth 2 (two) times daily. (Patient taking differently: Take 40 mg by mouth daily. )   60 tablet   1    .   Magnesium 250 MG TABS   Take 250 mg by mouth at bedtime.            .   metoprolol succinate (TOPROL-XL) 100 MG 24 hr tablet   Take 1 tablet (100 mg total) by mouth daily.   30 tablet   1    .   nitroGLYCERIN (NITROSTAT) 0.4 MG SL tablet   Place 0.4 mg under the tongue every 5 (five) minutes as needed for chest pain (max 3 doses in 15 minutes).            .   psyllium (REGULOID) 0.52 g capsule   Take 0.52 g by mouth at bedtime.            .   ranolazine (RANEXA) 500 MG 12 hr tablet   Take 1 tablet (500 mg total) by mouth 2 (two) times daily.   60 tablet   1    .   senna (SENOKOT) 8.6 MG TABS tablet   Take 1 tablet by mouth daily as needed (constipation).            Marland Kitchen   amLODipine (  NORVASC) 10 MG tablet   Take 1 tablet (10 mg total) by mouth 2 (two) times daily. (Patient not taking: Reported on 02/22/2018)   60 tablet   1    .   clopidogrel (PLAVIX) 75 MG tablet   Take 1 tablet (75 mg total) by mouth daily. (Patient not taking: Reported on 02/22/2018)   30 tablet   1    .   ezetimibe  (ZETIA) 10 MG tablet   Take 1 tablet (10 mg total) by mouth daily. (Patient not taking: Reported on 02/22/2018)   30 tablet   1    .   furosemide (LASIX) 40 MG tablet   Take 1 tablet (40 mg total) by mouth 2 (two) times daily. (Patient not taking: Reported on 02/22/2018)   60 tablet   1    .   glimepiride (AMARYL) 2 MG tablet   Take 1 tablet (2 mg total) by mouth 2 (two) times daily with a meal. (Patient not taking: Reported on 02/22/2018)   60 tablet   1    .   hydrALAZINE (APRESOLINE) 25 MG tablet   Take 3 tablets (75 mg total) by mouth every 8 (eight) hours. (Patient not taking: Reported on 02/22/2018)   90 tablet   0    .   Multiple Vitamins-Minerals (PRESERVISION AREDS 2) CAPS   Take 1 capsule by mouth 2 (two) times daily. (Patient not taking: Reported on 02/22/2018)   60 capsule   0    .   niacin 250 MG tablet   Take 1 tablet (250 mg total) by mouth every morning. (Patient not taking: Reported on 02/22/2018)   30 tablet   1    .   potassium chloride SA (K-DUR,KLOR-CON) 20 MEQ tablet   Take 1 tablet (20 mEq total) by mouth 2 (two) times daily. (Patient not taking: Reported on 02/22/2018)   60 tablet   1    .   pregabalin (LYRICA) 75 MG capsule   Take 1 capsule (75 mg total) by mouth 2 (two) times daily. (Patient not taking: Reported on 02/22/2018)   60 capsule   1    .   senna-docusate (SENOKOT-S) 8.6-50 MG tablet   Take 2 tablets by mouth 2 (two) times daily. (Patient not taking: Reported on 02/22/2018)                  Home:  Home Living  Family/patient expects to be discharged to:: Assisted living  Type of Home: Assisted living  Home Equipment: Dan Humphreys - 4 wheels, Wheelchair - manual  Additional Comments: Lives in assisted living facility with assistance for bathing and dressing per pt report. Makes small snacks for herself but has meals provided.    Functional History:  Prior Function  Level of  Independence: Needs assistance  Gait / Transfers Assistance Needed: rollator for ambulation in apartment. Lift chair to assist with transfers. Staff uses w/c to transport to dining room.  ADL's / Homemaking Assistance Needed: Staff assists with dressing and bathing.  Comments: Son and daughter in law provided above information.  Functional Status:   Mobility:  Bed Mobility  Overal bed mobility: Needs Assistance  Bed Mobility: Supine to Sit  Supine to sit: Min guard, HOB elevated  Sit to supine: Mod assist, HOB elevated  General bed mobility comments: cues for sequencing and use of rail; min guard for safety  Transfers  Overall transfer level: Needs assistance  Equipment used: Rolling walker (2 wheeled)  Transfers:  Sit to/from Stand  Sit to Stand: Mod assist, Min assist  Stand pivot transfers: Mod assist  General transfer comment: cues for safe hand placement; assist to power up into standing with mod A from EOB and min A from recliner and BSC  Ambulation/Gait  Ambulation/Gait assistance: Mod assist, +2 safety/equipment(chair follow)  Ambulation Distance (Feet): (78ft X2)  Assistive device: Rolling walker (2 wheeled)  Gait Pattern/deviations: Step-to pattern, Decreased step length - left, Decreased dorsiflexion - left, Trunk flexed, Ataxic  General Gait Details: assist for balance, weight shifting, and safe management of RW; seated rest break required due to fatigue; pt sat abruptly when chair pulled up behind her; after rest pt ambulated another ~94ft however limited by incontinence of stool and BSC brought up behind pt       ADL:  ADL  Overall ADL's : Needs assistance/impaired  Eating/Feeding: Sitting, Supervision/ safety  Grooming: Sitting, Min guard  Upper Body Bathing: Minimal assistance, Sitting  Lower Body Bathing: Moderate assistance, Sit to/from stand  Upper Body Dressing : Sitting, Minimal assistance  Lower Body Dressing: Moderate  assistance, Sit to/from stand  Toilet Transfer: Moderate assistance, Minimal assistance, RW, Ambulation  Toilet Transfer Details (indicate cue type and reason): Mod assist to power up to standing and min assist once ambulating.   Toileting- Clothing Manipulation and Hygiene: Moderate assistance, Sit to/from stand  Toileting - Clothing Manipulation Details (indicate cue type and reason): Assist for thoroughness  Functional mobility during ADLs: Minimal assistance, Rolling walker, Moderate assistance  General ADL Comments: Pt with decreased activity tolerance for ADL and decreased dynamic balance impacting ADL participation. Did note L UE weakness also impacting functionality.      Cognition:  Cognition  Overall Cognitive Status: Impaired/Different from baseline  Arousal/Alertness: Awake/alert  Orientation Level: Oriented X4  Attention: Selective  Selective Attention: Appears intact  Memory: (impaired at baseline)  Problem Solving: Impaired  Problem Solving Impairment: Functional basic  Cognition  Arousal/Alertness: Awake/alert  Behavior During Therapy: WFL for tasks assessed/performed  Overall Cognitive Status: Impaired/Different from baseline  Area of Impairment: Orientation, Memory, Following commands, Safety/judgement, Awareness, Problem solving, Attention  Orientation Level: Disoriented to, Time  Current Attention Level: Sustained  Memory: Decreased short-term memory  Following Commands: Follows one step commands consistently, Follows one step commands with increased time  Safety/Judgement: Decreased awareness of safety, Decreased awareness of deficits  Awareness: Emergent  Problem Solving: Slow processing, Requires verbal cues, Decreased initiation, Difficulty sequencing  General Comments: poor historian and responded "I can't remember" to a lot of questions asked; pt incontinent of stool while ambulating and when asked if she felt the urge to have a BM  pt replied "I can't remember"     Blood pressure (!) 205/81, pulse 61, temperature 97.7 F (36.5 C), temperature source Axillary, resp. rate 20, height 5\' 5"  (1.651 m), weight 75.9 kg (167 lb 5.3 oz), SpO2 97 %.  Physical Exam   Vitals reviewed.  HENT:   Head: Normocephalic.   Eyes: EOM are normal. Right eye exhibits no discharge. Left eye exhibits no discharge.   Neck: Normal range of motion. Neck supple. No thyromegaly present.   Cardiovascular:  Cardiac rate controlled   Respiratory: Effort normal and breath sounds normal. No respiratory distress.   GI: Soft. Bowel sounds are normal. She exhibits no distension.  Neurological: She is alert.  Makes good eye contact with examiner.  Follows commands.  She could not recall her latest rehab stay in October 2017 after CVA.  She provides her  name and age.   Skin: Skin is warm and dry.   Motor strength is 4/5 bilateral deltoid bicep tricep finger flexors extensors, hip flexor knee extensor ankle dorsiflexor.     Sensation intact light touch bilateral upper and lower limbs  Musculoskeletal no pain with shoulder wrist and hand hip knee and ankle range of motion.  Pedal pulses are thready bilaterally, toes are cool but skin is pink     Lab Results Last 24 Hours                                                                                                                                                                                                                                                                                                                                                                                                                                          Imaging Results (Last 48 hours)                                                            Assessment/Plan:  Diagnosis: Left thalamic left occipital infarcts in a patient with intracranial stenosis with resultant dysphagia, gait disorder and decline in functional abilities  1.Does the need for close,  24 hr/day medical supervision in concert with the patient's rehab needs make it unreasonable for this patient to be served in a less intensive setting? Yes   2.Co-Morbidities requiring supervision/potential complications: Diabetes, sick sinus syndrome, diastolic congestive heart failure, at risk for aspiration pneumonia   3.Due to bladder management, bowel management, safety, skin/wound care, disease management, medication administration, pain management and patient education, does the patient require 24 hr/day rehab nursing? Yes   4.Does the patient require coordinated care of a physician, rehab nurse, PT (1-2 hrs/day, 5 days/week), OT (1-2 hrs/day, 5 days/week) and SLP (.5-1 hrs/day, 5 days/week) to address physical and functional deficits in the context of the above medical diagnosis(es)? Yes Addressing deficits in the following areas: balance, endurance, locomotion, strength, transferring, bowel/bladder control, bathing, dressing, feeding, grooming, toileting, cognition, swallowing and psychosocial support   5.Can the patient actively participate in an intensive therapy program of at least 3 hrs of therapy per day at least 5 days per week? Yes   6.The potential for patient to make measurable gains while on inpatient rehab is good   7.Anticipated functional outcomes upon discharge from inpatient rehab are supervision and min assist  with PT, supervision and min assist with OT, modified independent and supervision with SLP.   8.Estimated rehab length of stay to reach the above functional goals is: 14-17  days   9.Anticipated D/C setting: Home   10.Anticipated post D/C treatments: HH therapy   11.Overall Rehab/Functional Prognosis: good      RECOMMENDATIONS:  This patient's condition is appropriate for continued rehabilitative care in the following setting: If assisted living can provide mobility assistance and increased supervision then recommend CIR  Patient has agreed to participate in recommended program. Yes  Note that insurance prior authorization may be required for reimbursement for recommended care.     Comment: Consider lower extremity vascular studies to evaluate lower extremity pain        Charlton Amor, PA-C  02/26/2018                Revision History                                        Routing History

## 2018-02-26 NOTE — Progress Notes (Signed)
[name], RN  Rehab Admission Coordinator  Physical Medicine and Rehabilitation  PMR Pre-admission  Signed  Date of Service:  02/26/2018 4:01 PM       Related encounter: ED to Hosp-Admission (Current) from 02/22/2018 in [place] [facility]      Signed           [] Hide copied text  [] Hover for details   PMR Admission Coordinator Pre-Admission Assessment  Patient: Charlene Greene is an 84 y.o., female MRN: [id number] DOB: 1922/08/29 Height: 5\' 5"  (165.1 cm) Weight: 75.9 kg (167 lb 5.3 oz)                                                                                                                                                  Insurance Information HMO:     PPO:      PCP:      IPA:      80/20: yes     OTHER: no hmo PRIMARY: Medicare a and b      Policy#: [id number] d2/ family to provide updates Medicare number      Subscriber: pt Benefits:  Phone #: online  Date: 08/12/1989     Deduct: $4132      Out of Pocket Max: none      Life Max: none CIR: 100%      SNF: 20 full days Outpatient: 80%     Co-Pay: 20% Home Health: 100%      Co-Pay: none DME: 80%     Co-Pay: 20% Providers: pt choice  SECONDARY: GEHA      Policy#: [id number] geha      Subscriber: pt  Medicaid Application Date:       Case Manager:  Disability Application Date:       Case Worker:   Emergency Contact Information         Contact Information    Name Relation Home Work Mobile   Avard Son   [phone]   Tierra, Thoma   [phone]   Gerstenberger,Denise Other   [phone]     Current Medical History  Patient Admitting Diagnosis: left thalamic infarcts  History of Present Illness:  HPI:Charlene Greene is a 84 year old right-handed female with history of CVA 2017 maintained on aspirin and Plavix, diabetes mellitus, hypertension, sick sinus syndrome with pacemaker, diastolic congestive heart failure. Patient received inpatient rehab service November 2017 for  right basal ganglia infarction secondary to small vessel disease.  Presented 02/22/2018 with altered mental status. Cranial CT scan negative. MRI showed patchy small volume acute ischemic nonhemorrhagic infarction involving the left thalamus, left occipital lobe and left temporal lobe, left PCA territory. MRA of the neck was negative. MRI of the head with extensive atherosclerotic changes. Severe near occlusion right A2 stenosis, right chronic M2 occlusion. Echocardiogram with ejection fraction of 70% grade 1 diastolic dysfunction. Neurology consulted presently on  aspirin and Plavix therapy times 3 months then aspirin alone. Cardiology follow-up pacemaker interrogated showed no signs of A. fib. Presently on a dysphagia #3 nectar thick liquid diet. Patient with complaints of hip pain x-rays unremarkable.   Total: 0 NIHSS  Past Medical History      Past Medical History:  Diagnosis Date  . CAD (coronary artery disease)   . Diabetes mellitus without complication (HCC)   . Hyperlipidemia   . Hypertension   . Pacemaker   . Renal artery stenosis (HCC)    s/p bilateral stents  . Sick sinus syndrome (HCC)   . Stroke [facility](HCC)    2 previous cva's    Family History  family history includes Hypertension in her other; Lung cancer in her sister.  Prior Rehab/Hospitalizations:  Has the patient had major surgery during 100 days prior to admission? No   CIR 2017. [facility] and d/c home at Mod I level to ILF after 15 days.  Current Medications   Current Facility-Administered Medications:  .  0.9 %  sodium chloride infusion, , Intravenous, Continuous, Doutova, Anastassia, MD, Stopped at 02/23/18 1126 .  acetaminophen (TYLENOL) tablet 650 mg, 650 mg, Oral, Q4H PRN, 650 mg at 02/25/18 2149 **OR** acetaminophen (TYLENOL) solution 650 mg, 650 mg, Per Tube, Q4H PRN **OR** acetaminophen (TYLENOL) suppository 650 mg, 650 mg, Rectal, Q4H PRN, Doutova,  Anastassia, MD .  albuterol (PROVENTIL) (2.5 MG/3ML) 0.083% nebulizer solution 2.5 mg, 2.5 mg, Nebulization, Q4H PRN, Doutova, Anastassia, MD .  amLODipine (NORVASC) tablet 5 mg, 5 mg, Oral, BID, Doutova, Anastassia, MD, 5 mg at 02/26/18 0817 .  aspirin EC tablet 81 mg, 81 mg, Oral, Daily, Micki RileySethi, Pramod S, MD, 81 mg at 02/26/18 0816 .  clopidogrel (PLAVIX) tablet 75 mg, 75 mg, Oral, Daily, Amankwah, Pearl, NP, 75 mg at 02/26/18 0817 .  ezetimibe (ZETIA) tablet 10 mg, 10 mg, Oral, Daily, Mahala MenghiniSamtani, Jai-Gurmukh, MD, 10 mg at 02/26/18 0817 .  guaiFENesin (MUCINEX) 12 hr tablet 600 mg, 600 mg, Oral, BID, Mahala MenghiniSamtani, Jai-Gurmukh, MD, 600 mg at 02/26/18 0817 .  insulin aspart (novoLOG) injection 0-5 Units, 0-5 Units, Subcutaneous, QHS, Doutova, Anastassia, MD, 2 Units at 02/25/18 2200 .  insulin aspart (novoLOG) injection 0-9 Units, 0-9 Units, Subcutaneous, TID WC, Doutova, Anastassia, MD, 1 Units at 02/26/18 [phone] .  metoprolol succinate (TOPROL-XL) 24 hr tablet 100 mg, 100 mg, Oral, Daily, Doutova, Anastassia, MD, 100 mg at 02/26/18 0817 .  RESOURCE THICKENUP CLEAR, , Oral, PRN, Rhetta MuraSamtani, Jai-Gurmukh, MD .  senna-docusate (Senokot-S) tablet 1 tablet, 1 tablet, Oral, QHS PRN, Doutova, Anastassia, MD .  sodium chloride 0.9 % bolus 1,000 mL, 1,000 mL, Intravenous, Once, Doutova, Anastassia, MD  Patients Current Diet: Fall precautions Aspiration precautions Fall precautions Fall precautions Fall precautions DIET DYS 3 Room service appropriate? Yes; Fluid consistency: Nectar Thick  Precautions / Restrictions Precautions Precautions: Fall Precaution Comments: L sided weakness, instability Restrictions Weight Bearing Restrictions: No   Has the patient had 2 or more falls or a fall with injury in the past year?No  Prior Activity Level Community (5-7x/wk): Mod I with Rw pta; [facility] dinng [facility]  Home Assistive Devices / Equipment Home Assistive Devices/Equipment:  Environmental consultantWalker (specify type), Wheelchair Home Equipment: Walker - 4 wheels, Wheelchair - manual  Prior Device Use: Indicate devices/aids used by the patient prior to current illness, exacerbation or injury? Walker  Prior Functional Level Prior Function Level of Independence: Needs assistance Gait / Transfers  Assistance Needed: rollator for ambulation in apartment. Lift chair to assist with transfers. Staff uses w/c to transport to dining room. ADL's / Homemaking Assistance Needed: Staff assists with dressing and bathing. Comments: Son and daughter in law provided above information.  Self Care: Did the patient need help bathing, dressing, using the toilet or eating?  Needed some help  Indoor Mobility: Did the patient need assistance with walking from room to room (with or without device)? Needed some help  Stairs: Did the patient need assistance with internal or external stairs (with or without device)? Needed some help  Functional Cognition: Did the patient need help planning regular tasks such as shopping or remembering to take medications? Needed some help  Current Functional Level Cognition  Arousal/Alertness: Awake/alert Overall Cognitive Status: Impaired/Different from baseline Current Attention Level: Sustained Orientation Level: Oriented X4 Following Commands: Follows one step commands consistently, Follows one step commands with increased time Safety/Judgement: Decreased awareness of safety, Decreased awareness of deficits General Comments: son present during session; pt is not at baseline cognition per son and continue to have delayed responses  Attention: Selective Selective Attention: Appears intact Memory: (impaired at baseline) Problem Solving: Impaired Problem Solving Impairment: Functional basic    Extremity Assessment (includes Sensation/Coordination)  Upper Extremity Assessment: Defer to OT evaluation LUE Deficits / Details: 3+/5 strength throughout LUE.     Lower Extremity Assessment: LLE deficits/detail LLE Deficits / Details: grossly 3+/5, unsure if this is new onset or from previous CVA    ADLs  Overall ADL's : Needs assistance/impaired Eating/Feeding: Sitting, Supervision/ safety Grooming: Sitting, Min guard Upper Body Bathing: Minimal assistance, Sitting Lower Body Bathing: Moderate assistance, Sit to/from stand Upper Body Dressing : Sitting, Minimal assistance Lower Body Dressing: Moderate assistance, Sit to/from stand Toilet Transfer: Moderate assistance, Minimal assistance, RW, Ambulation Toilet Transfer Details (indicate cue type and reason): Mod assist to power up to standing and min assist once ambulating.  Toileting- Clothing Manipulation and Hygiene: Moderate assistance, Sit to/from stand Toileting - Clothing Manipulation Details (indicate cue type and reason): Assist for thoroughness Functional mobility during ADLs: Minimal assistance, Rolling walker, Moderate assistance General ADL Comments: Pt with decreased activity tolerance for ADL and decreased dynamic balance impacting ADL participation. Did note L UE weakness also impacting functionality.     Mobility  Overal bed mobility: Needs Assistance Bed Mobility: Supine to Sit Supine to sit: Min guard, HOB elevated Sit to supine: Mod assist, HOB elevated General bed mobility comments: cues for sequencing and use of rail; min guard for safety    Transfers  Overall transfer level: Needs assistance Equipment used: Rolling walker (2 wheeled) Transfers: Sit to/from Stand Sit to Stand: Mod assist, Min assist Stand pivot transfers: Mod assist General transfer comment: cues for hand placement and assistance to power up into standing and gain balance upon stand; from EOB and recliner; increased time and cues needed to transition L hand placement to RW    Ambulation / Gait / Stairs / Wheelchair Mobility  Ambulation/Gait Ambulation/Gait assistance: Mod assist, +2  safety/equipment(chair follow) Ambulation Distance (Feet): (40ft X2) Assistive device: Rolling walker (2 wheeled) Gait Pattern/deviations: Step-to pattern, Decreased step length - left, Decreased dorsiflexion - left, Trunk flexed, Decreased stance time - left General Gait Details: cues for sequencing and safe proximity to RW; assistance required for balance and weight shifting however pt able to manage RW more independently this session; L LE weakness and buckling noted with fatigue    Posture / Balance Balance Overall balance assessment: Needs assistance  Sitting-balance support: Feet supported, Bilateral upper extremity supported Sitting balance-Leahy Scale: Fair Standing balance support: During functional activity, Bilateral upper extremity supported Standing balance-Leahy Scale: Poor Standing balance comment: reliant on BUE support    Special needs/care consideration BiPAP/CPAP  N/a CPM n/a Continuous Drip IV  N/a Dialysis  N/a Life Vest  N/a Oxygen  N/a Special Bed  N/a Trach Size n/a Wound Vac n/a Skin intact Bowel mgmt: continent LBM 4/15 Bladder mgmt: external catheter Diabetic mgmt yes   Previous Home Environment Living Arrangements: Other (Comment)(lives at [facility] for 8 months; was at [place] )  Lives With: Other (Comment) Available Help at Discharge: Personal care attendant Type of Home: Assisted living Care [facility] Home Layout: One level Home Access: Level entry, Engineer, maintenance (IT) Shower/Tub: Health visitor: Handicapped height Bathroom Accessibility: Yes How Accessible: Accessible via wheelchair, Accessible via walker Home Care Services: Other (Comment) Additional Comments: Lives in assisted living facility with assistance for bathing and dressing per pt report. Makes small snacks for herself but has meals provided.   Discharge Living Setting Plans for Discharge Living Setting: Other (Comment)(ALF,  [facility]) Type of Home at Discharge: Other (Comment)(ALF) Discharge Home Layout: One level Discharge Home Access: Elevator Discharge Bathroom Shower/Tub: Walk-in shower Discharge Bathroom Toilet: Handicapped height Discharge Bathroom Accessibility: Yes How Accessible: Accessible via wheelchair, Accessible via walker Does the patient have any problems obtaining your medications?: No  Social/Family/Support Systems Patient Roles: Parent(2 sons; married 3 times; widow for 3 years) Solicitor Information: [name], son Anticipated Caregiver: Aides assist at ALF Anticipated Caregiver's Contact Information: [name], son and Trena Platt, Resident Care Director Ability/Limitations of Caregiver: patient can pay for different level of aide services as needed Caregiver Availability: 24/7 Discharge Plan Discussed with Primary Caregiver: Yes Is Caregiver In Agreement with Plan?: Yes Does Caregiver/Family have Issues with Lodging/Transportation while Pt is in Rehab?: No  Goals/Additional Needs Patient/Family Goal for Rehab: supervision to mon assist with PT, OT, and SLP Expected length of stay: ELOS 14 to 17 days Pt/Family Agrees to Admission and willing to participate: Yes Program Orientation Provided & Reviewed with Pt/Caregiver Including Roles  & Responsibilities: Yes  Decrease burden of Care through IP rehab admission: n/a  Possible need for SNF placement upon discharge: not anticipated  Patient Condition: This patient's condition remains as documented in the consult dated 02/26/2018, in which the Rehabilitation Physician determined and documented that the patient's condition is appropriate for intensive rehabilitative care in an inpatient rehabilitation facility. Will admit to inpatient rehab today.  Preadmission Screen Completed By:  Clois Dupes, 02/26/2018 4:08 PM ______________________________________________________________________   Discussed status with Dr. Allena Katz on  02/26/2018 at  1607 and received telephone approval for admission today.  Admission Coordinator:  Clois Dupes, time 4098 Date 02/26/2018             Cosigned by: Marcello Fennel, MD at 02/26/2018 4:20 PM  Revision History

## 2018-02-26 NOTE — Progress Notes (Signed)
I met with pt at bedside and contacted her son, [name], By phone. We discussed goals and expectations of an inpt rehab and they are in agreement to admit. I spoke with director at her ALF to clarify supports available. RN CM and SW made aware. I will contact Dr. Ree Kida and make arrangements to admit today. [phone]

## 2018-02-27 ENCOUNTER — Inpatient Hospital Stay (HOSPITAL_COMMUNITY): Payer: Medicare Other | Admitting: Physical Therapy

## 2018-02-27 ENCOUNTER — Inpatient Hospital Stay (HOSPITAL_COMMUNITY): Payer: Medicare Other

## 2018-02-27 ENCOUNTER — Inpatient Hospital Stay (HOSPITAL_COMMUNITY): Payer: Medicare Other | Admitting: Speech Pathology

## 2018-02-27 ENCOUNTER — Inpatient Hospital Stay (HOSPITAL_COMMUNITY): Payer: Medicare Other | Admitting: Occupational Therapy

## 2018-02-27 DIAGNOSIS — I639 Cerebral infarction, unspecified: Secondary | ICD-10-CM

## 2018-02-27 DIAGNOSIS — E1142 Type 2 diabetes mellitus with diabetic polyneuropathy: Secondary | ICD-10-CM

## 2018-02-27 DIAGNOSIS — M7989 Other specified soft tissue disorders: Secondary | ICD-10-CM

## 2018-02-27 DIAGNOSIS — E119 Type 2 diabetes mellitus without complications: Secondary | ICD-10-CM

## 2018-02-27 DIAGNOSIS — I5032 Chronic diastolic (congestive) heart failure: Secondary | ICD-10-CM

## 2018-02-27 DIAGNOSIS — E46 Unspecified protein-calorie malnutrition: Secondary | ICD-10-CM

## 2018-02-27 DIAGNOSIS — I69391 Dysphagia following cerebral infarction: Secondary | ICD-10-CM

## 2018-02-27 LAB — COMPREHENSIVE METABOLIC PANEL
ALBUMIN: 3.2 g/dL — AB (ref 3.5–5.0)
ALK PHOS: 48 U/L (ref 38–126)
ALT: 19 U/L (ref 14–54)
AST: 16 U/L (ref 15–41)
Anion gap: 9 (ref 5–15)
BILIRUBIN TOTAL: 0.5 mg/dL (ref 0.3–1.2)
BUN: 21 mg/dL — AB (ref 6–20)
CALCIUM: 9.1 mg/dL (ref 8.9–10.3)
CO2: 28 mmol/L (ref 22–32)
CREATININE: 0.95 mg/dL (ref 0.44–1.00)
Chloride: 105 mmol/L (ref 101–111)
GFR calc Af Amer: 58 mL/min — ABNORMAL LOW (ref >60)
GFR, EST NON AFRICAN AMERICAN: 50 mL/min — AB (ref >60)
Glucose, Bld: 156 mg/dL — ABNORMAL HIGH (ref 65–99)
Potassium: 4 mmol/L (ref 3.5–5.1)
Sodium: 142 mmol/L (ref 135–145)
TOTAL PROTEIN: 5.4 g/dL — AB (ref 6.5–8.1)

## 2018-02-27 LAB — CBC WITH DIFFERENTIAL/PLATELET
BASOS ABS: 0 10*3/uL (ref 0.0–0.1)
BASOS PCT: 1 %
Eosinophils Absolute: 0.2 10*3/uL (ref 0.0–0.7)
Eosinophils Relative: 3 %
HEMATOCRIT: 42.8 % (ref 36.0–46.0)
HEMOGLOBIN: 13.5 g/dL (ref 12.0–15.0)
LYMPHS PCT: 34 %
Lymphs Abs: 2.2 10*3/uL (ref 0.7–4.0)
MCH: 28.8 pg (ref 26.0–34.0)
MCHC: 31.5 g/dL (ref 30.0–36.0)
MCV: 91.3 fL (ref 78.0–100.0)
MONO ABS: 0.5 10*3/uL (ref 0.1–1.0)
MONOS PCT: 8 %
NEUTROS ABS: 3.4 10*3/uL (ref 1.7–7.7)
NEUTROS PCT: 54 %
Platelets: 223 10*3/uL (ref 150–400)
RBC: 4.69 MIL/uL (ref 3.87–5.11)
RDW: 14.3 % (ref 11.5–15.5)
WBC: 6.3 10*3/uL (ref 4.0–10.5)

## 2018-02-27 LAB — GLUCOSE, CAPILLARY
GLUCOSE-CAPILLARY: 133 mg/dL — AB (ref 65–99)
Glucose-Capillary: 135 mg/dL — ABNORMAL HIGH (ref 65–99)
Glucose-Capillary: 264 mg/dL — ABNORMAL HIGH (ref 65–99)
Glucose-Capillary: 284 mg/dL — ABNORMAL HIGH (ref 65–99)

## 2018-02-27 MED ORDER — PRO-STAT SUGAR FREE PO LIQD
30.0000 mL | Freq: Two times a day (BID) | ORAL | Status: DC
  Administered 2018-02-27 – 2018-03-13 (×29): 30 mL via ORAL
  Filled 2018-02-27 (×29): qty 30

## 2018-02-27 NOTE — Evaluation (Signed)
Speech Language Pathology Assessment and Plan  Patient Details  Name: Charlene Greene MRN: [id number] Date of Birth: 12/10/1921  SLP Diagnosis: Dysphagia;Cognitive Impairments  Rehab Potential: Good ELOS: 14-17 days     Today's Date: 02/27/2018 SLP Individual Time: 1345-1455 SLP Individual Time Calculation (min): 70 min   Problem List:  Patient Active Problem List   Diagnosis Date Noted  . Hypoalbuminemia due to protein-calorie malnutrition ([place])   . Diabetic peripheral neuropathy ([place])   . Diabetes mellitus type 2 in nonobese (HCC)   . Chronic diastolic congestive heart failure ([place])   . Left thalamic infarction ([place]) 02/26/2018  . Dysphagia, post-stroke   . Acute on chronic diastolic congestive heart failure ([place])   . Type 2 diabetes mellitus with peripheral neuropathy (HCC)   . TIA (transient ischemic attack) 02/22/2018  . Labile blood glucose   . Hypotension due to drugs   . Hyponatremia   . Renovascular hypertension   . Hypertensive crisis   . Type 2 diabetes mellitus with diabetic autonomic neuropathy, without long-term current use of insulin ([place])   . Stage 3 chronic kidney disease ([place])   . Acute lower UTI   . Acute renal insufficiency 09/27/2016  . Urinary urgency 09/27/2016  . Basal ganglia infarction ([place]) 09/26/2016  . Neuropathic pain   . Benign essential HTN   . Slow transit constipation   . Elevated creatine kinase   . Accelerated hypertension 09/24/2016  . Diabetes mellitus ([place]) 09/24/2016  . Hyperlipidemia 09/24/2016  . Sick sinus syndrome ([place]) 09/24/2016  . CVA (cerebral vascular accident) ([place]) 09/23/2016  . Stroke (cerebrum) ([place]) 09/23/2016   Past Medical History:  Past Medical History:  Diagnosis Date  . CAD (coronary artery disease)   . Diabetes mellitus without complication ([place])   . Hyperlipidemia   . Hypertension   . Pacemaker   . Renal artery stenosis (HCC)    s/p bilateral stents  . Sick sinus syndrome ([place])   . Stroke [facility])    2  previous cva's   Past Surgical History:  Past Surgical History:  Procedure Laterality Date  . PACEMAKER PLACEMENT  05/15/2014   MDT Advisa MRI implanted at [facility] for sick sinus syndrome    Assessment / Plan / Recommendation Clinical Impression   Ms. Charlene Greene is a 84 year old right-handed female with history of CVA 2017 maintained on aspirin and Plavix, diabetes mellitus, hypertension, sick sinus syndrome with pacemaker, diastolic congestive heart failure.  Patient received inpatient rehab service November 2017 for right basal ganglia infarction secondary to small vessel disease.  Per chart review and patient, she is a resident of assisted living facility [facility].  She does receive some assistance with bathing and dressing.  Her meals are provided.  She uses a walker and a wheelchair at the assisted living facility.  Presented 02/22/2018 with altered mental status.  Cranial CT reviewed, unremarkable for acute intracranial process. MRI showed patchy small volume acute ischemic nonhemorrhagic infarction involving the left thalamus, left occipital lobe and left temporal lobe, left PCA territory.  MRA of the neck was negative.  MRI of the head with extensive atherosclerotic changes.  Severe near occlusion right A2 stenosis, right chronic M2 occlusion.  Echocardiogram with ejection fraction of 40% grade 1 diastolic dysfunction.  Neurology consulted presently on aspirin and Plavix therapy times 3 months then aspirin alone.  Cardiology follow-up pacemaker interrogated showed no signs of A. fib.  Presently on a dysphagia #3 nectar thick liquid diet.  Patient with complaints of hip pain  x-rays unremarkable.  Physical therapy evaluation completed with recommendations of physical medicine rehab consult.  Patient was admitted for a comprehensive rehab program.  SLP evaluation was completed on 02/27/2018 with the follow results:  Pt presents with excellent toleration of dys 3 textures and nectar thick  liquids during lunch meal today.  She utilized swallowing precautions with mod I and demonstrated no overt s/s of aspiration with solids or liquids.  Recommend that pt be advanced to intermittent supervision during meals.  Pt may have sips of water in between meals following oral care per the water protocol. Informally, pt also presents with mild cognitive deficits which are likely near her baseline given pt's age and history of previous CVA.  Pt had decreased selective attention to tasks, decreased recall of information, and decreased functional problem solving and required min assist for tasks.  As a result, pt would benefit from skilled ST while inpatient in order to maximize functional independence and reduce burden of care prior to discharge.  Anticipate that pt will be appropriate to return to assisted living facility.       Skilled Therapeutic Interventions          Cognitive-linguistic evaluation completed with results and recommendations reviewed with patient.     SLP Assessment  Patient will need skilled Speech Lanaguage Pathology Services during CIR admission    Recommendations  SLP Diet Recommendations: Dysphagia 3 (Mech soft);Nectar Liquid Administration via: Cup Medication Administration: Whole meds with puree Supervision: Patient able to self feed;Intermittent supervision to cue for compensatory strategies Compensations: Slow rate;Small sips/bites Postural Changes and/or Swallow Maneuvers: Seated upright 90 degrees Oral Care Recommendations: Oral care BID Patient destination: Assisted Living Follow up Recommendations: Other (comment)(TBD) Equipment Recommended: To be determined    SLP Frequency 3 to 5 out of 7 days   SLP Duration  SLP Intensity  SLP Treatment/Interventions 14-17 days   Minumum of 1-2 x/day, 30 to 90 minutes  Cognitive remediation/compensation;Cueing hierarchy;Dysphagia/aspiration precaution training;Internal/external aids;Environmental  controls;Patient/family education    Pain Pain Assessment Pain Scale: 0-10 Pain Score: 0-No pain  Prior Functioning Cognitive/Linguistic Baseline: Baseline deficits Baseline deficit details: assisted living facility Type of Home: Assisted living  Lives With: Other (Comment)(assisted living facility ) Available Help at Discharge: Other (Comment)(assisted living facility ) Vocation: Retired  Function:  Eating Eating   Modified Consistency Diet: Yes Eating Assist Level: More than reasonable amount of time;Set up assist for   Eating Set Up Assist For: Opening containers       Cognition Comprehension Comprehension assist level: Follows basic conversation/direction with extra time/assistive device  Expression   Expression assist level: Expresses basic needs/ideas: With no assist  Social Interaction Social Interaction assist level: Interacts appropriately with others with medication or extra time (anti-anxiety, antidepressant).  Problem Solving Problem solving assist level: Solves basic 75 - 89% of the time/requires cueing 10 - 24% of the time  Memory Memory assist level: Recognizes or recalls 75 - 89% of the time/requires cueing 10 - 24% of the time   Short Term Goals: Week 1: SLP Short Term Goal 1 (Week 1): Pt will consume therapeutic trials of thin liquids with supervision cues for use of swallowing precautions and minimal overt s/s of aspiration over three consecutive sessions prior to advancement.  SLP Short Term Goal 2 (Week 1): Pt will consume therapeutic trials of mixed solids and thin liquid consistencies with supervision cues for use of swallowing precautions and minimal overt s/s of aspiration over three consecutive sessions prior to advancement.  SLP Short Term Goal 3 (Week 1): Pt will selectively attend to tasks in a mildly distracting environment for 30 minutes with supervision verbal cues for redirection to task.  SLP Short Term Goal 4 (Week 1): Pt will recall daily  information with supervision verbal cues for use of external aids.   SLP Short Term Goal 5 (Week 1): Pt will complete basic, tasks with supervision cues for functional problem solving.    Refer to Care Plan for Long Term Goals  Recommendations for other services: None   Discharge Criteria: Patient will be discharged from SLP if patient refuses treatment 3 consecutive times without medical reason, if treatment goals not met, if there is a change in medical status, if patient makes no progress towards goals or if patient is discharged from hospital.  The above assessment, treatment plan, treatment alternatives and goals were discussed and mutually agreed upon: by patient  Emilio Math 02/27/2018, 4:19 PM

## 2018-02-27 NOTE — Progress Notes (Signed)
[place] [facility]IC[facility] & REHABILITATION     PROGRESS NOTE  Subjective/Complaints:  Patient seen lying in bed this morning. She states she slept well overnight. She has to be repositioned and says she is uncomfortable. She states she doesn't like therapies and don't want to do therapies.  ROS: denies CP, SOB, nausea, vomiting, diarrhea.  Objective: Vital Signs: Blood pressure (!) 176/77, pulse 61, temperature 97.9 F (36.6 C), temperature source Oral, resp. rate 16, height 5\' 5"  (1.651 m), weight 76.1 kg (167 lb 12.3 oz), SpO2 95 %. Dg Chest 2 View  Result Date: 02/25/2018 CLINICAL DATA:  Cough and congestion EXAM: CHEST - 2 VIEW COMPARISON:  02/22/2018 FINDINGS: Left-sided pacing device as before. No significant pleural effusion. Stable cardiomediastinal silhouette with aortic atherosclerosis. No pneumothorax. Small calcified loose bodies at the shoulders. IMPRESSION: No active cardiopulmonary disease.  Borderline to mild cardiomegaly. Electronically Signed   By: Jasmine Pang M.D.   On: 02/25/2018 19:59   Dg Swallowing Func-speech Pathology  Result Date: 02/25/2018 Objective Swallowing Evaluation: Type of Study: MBS-Modified Barium Swallow Study  Patient Details Name: Charlene Greene MRN: [id number] Date of Birth: 1922/09/02 Today's Date: 02/25/2018 Time: SLP Start Time (ACUTE ONLY): 0930 -SLP Stop Time (ACUTE ONLY): 0948 SLP Time Calculation (min) (ACUTE ONLY): 18 min Past Medical History: Past Medical History: Diagnosis Date . CAD (coronary artery disease)  . Diabetes mellitus without complication (HCC)  . Hyperlipidemia  . Hypertension  . Pacemaker  . Renal artery stenosis (HCC)   s/p bilateral stents . Sick sinus syndrome (HCC)  . Stroke [facility])   2 previous cva's Past Surgical History: Past Surgical History: Procedure Laterality Date . PACEMAKER PLACEMENT  05/15/2014  MDT Advisa MRI implanted at [facility] for sick sinus syndrome HPI: Charlene Greene is a 84 y.o. female with medical history  significant of CVA 2017, DM 2, HTN, sick sinus syndrome, sp pacemaker, diastolic CHF presented with left side weakness, slurred speech and facial droop similar to prior presentation of CVA in 2107. Per chart pt reports 1 week of cough sometimes worse with eating. Patient had dysphasia with prior CVA but it resolved and she is on regular diet. CXR No edema or consolidation. Head CT No acute intracranial abnormality. BSE 09/2016 mild oropharyngeal dysphagia, reg/thin recommended.  Subjective: pt pleasant, cooperative Assessment / Plan / Recommendation CHL IP CLINICAL IMPRESSIONS 02/25/2018 Clinical Impression Pt presents with a mild oropharyngeal dysphagia.  Pt has decreased oral control of boluses in the setting of mild base of tongue weakness which leads to premature spillage and delay in swallow initiation to the level of the pyriforms.  This, in combination with decreased airway closure during the swallow, lead to penetration with nectar thick liquids (instance of deep penetration on initial sip which improved to flash penetration on subsequent boluses). Pt also had deep penetration with small controlled sips of thin liquids which worsened to aspiration when challenged to take large, consecutive boluses.  Furthermore, pt aspirated thin liquids when consuming mixed consistency boluses.  Aspiration of thins alone was trace in amount and went unsensed by pt; however, larger amounts of aspiration seen following mixed consistency boluses elicited a strong cough response.  Mild pharyngeal residue was also evident throughout study, suspect due to a combination of decreased UES relaxation and base of tongue weakness.  Taking small sips followed by a second swallow to clear residue appeared to be an effective technique for maximizing airway protection with all consistencies.  For now, recommend that pt remain on dys 3  textures and nectar thick liquids with full supervision for use of swallowing precautions.  Pt may have  sips of water in between meals following oral care with full staff supervision per the water protocol.   SLP Visit Diagnosis Dysphagia, oropharyngeal phase (R13.12) Attention and concentration deficit following -- Frontal lobe and executive function deficit following -- Impact on safety and function Mild aspiration risk   CHL IP TREATMENT RECOMMENDATION 02/25/2018 Treatment Recommendations Therapy as outlined in treatment plan below   Prognosis 02/25/2018 Prognosis for Safe Diet Advancement Good Barriers to Reach Goals -- Barriers/Prognosis Comment -- CHL IP DIET RECOMMENDATION 02/25/2018 SLP Diet Recommendations Dysphagia 3 (Mech soft) solids;Nectar thick liquid Liquid Administration via Cup;Straw Medication Administration Crushed with puree Compensations Slow rate;Small sips/bites Postural Changes Seated upright at 90 degrees   CHL IP OTHER RECOMMENDATIONS 02/25/2018 Recommended Consults -- Oral Care Recommendations Oral care BID Other Recommendations --   CHL IP FOLLOW UP RECOMMENDATIONS 02/25/2018 Follow up Recommendations Skilled Nursing facility   [facility] IP FREQUENCY AND DURATION 02/25/2018 Speech Therapy Frequency (ACUTE ONLY) min 2x/week Treatment Duration --      CHL IP ORAL PHASE 02/25/2018 Oral Phase Impaired Oral - Pudding Teaspoon -- Oral - Pudding Cup -- Oral - Honey Teaspoon -- Oral - Honey Cup -- Oral - Nectar Teaspoon -- Oral - Nectar Cup Premature spillage;Decreased bolus cohesion Oral - Nectar Straw Premature spillage Oral - Thin Teaspoon -- Oral - Thin Cup Premature spillage;Decreased bolus cohesion;Weak lingual manipulation Oral - Thin Straw Premature spillage Oral - Puree -- Oral - Mech Soft -- Oral - Regular -- Oral - Multi-Consistency Premature spillage;Decreased bolus cohesion Oral - Pill Premature spillage;Decreased bolus cohesion Oral Phase - Comment --  CHL IP PHARYNGEAL PHASE 02/25/2018 Pharyngeal Phase Impaired Pharyngeal- Pudding Teaspoon -- Pharyngeal -- Pharyngeal- Pudding Cup -- Pharyngeal --  Pharyngeal- Honey Teaspoon -- Pharyngeal -- Pharyngeal- Honey Cup -- Pharyngeal -- Pharyngeal- Nectar Teaspoon -- Pharyngeal -- Pharyngeal- Nectar Cup Reduced airway/laryngeal closure;Reduced tongue base retraction;Penetration/Aspiration during swallow;Delayed swallow initiation-pyriform sinuses;Pharyngeal residue - valleculae;Pharyngeal residue - pyriform Pharyngeal Material enters airway, CONTACTS cords and not ejected out Pharyngeal- Nectar Straw Delayed swallow initiation-pyriform sinuses;Penetration/Aspiration during swallow;Reduced airway/laryngeal closure;Reduced tongue base retraction;Pharyngeal residue - pyriform;Pharyngeal residue - valleculae Pharyngeal Material enters airway, remains ABOVE vocal cords then ejected out Pharyngeal- Thin Teaspoon -- Pharyngeal -- Pharyngeal- Thin Cup Reduced airway/laryngeal closure;Reduced tongue base retraction;Delayed swallow initiation-pyriform sinuses;Penetration/Aspiration during swallow Pharyngeal Material enters airway, CONTACTS cords and not ejected out Pharyngeal- Thin Straw Penetration/Aspiration during swallow;Reduced tongue base retraction;Delayed swallow initiation-pyriform sinuses;Reduced airway/laryngeal closure Pharyngeal Material enters airway, passes BELOW cords without attempt by patient to eject out (silent aspiration) Pharyngeal- Puree -- Pharyngeal -- Pharyngeal- Mechanical Soft -- Pharyngeal -- Pharyngeal- Regular -- Pharyngeal -- Pharyngeal- Multi-consistency Delayed swallow initiation-vallecula;Reduced tongue base retraction;Reduced airway/laryngeal closure;Pharyngeal residue - pyriform;Pharyngeal residue - valleculae;Penetration/Aspiration during swallow Pharyngeal Material enters airway, passes BELOW cords and not ejected out despite cough attempt by patient Pharyngeal- Pill Delayed swallow initiation-vallecula;Reduced tongue base retraction Pharyngeal -- Pharyngeal Comment --  CHL IP CERVICAL ESOPHAGEAL PHASE 02/25/2018 Cervical Esophageal Phase  Impaired Pudding Teaspoon -- Pudding Cup -- Honey Teaspoon -- Honey Cup -- Nectar Teaspoon -- Nectar Cup -- Nectar Straw -- Thin Teaspoon -- Thin Cup -- Thin Straw -- Puree -- Mechanical Soft -- Regular -- Multi-consistency -- Pill -- Cervical Esophageal Comment decreased UES relaxation No flowsheet data found. Page, Melanee Spry 02/25/2018, 10:38 AM              Recent Labs  02/27/18 0618  WBC 6.3  HGB 13.5  HCT 42.8  PLT 223   Recent Labs    02/26/18 0546 02/27/18 0618  NA 140 142  K 3.6 4.0  CL 103 105  GLUCOSE 153* 156*  BUN 16 21*  CREATININE 0.83 0.95  CALCIUM 9.1 9.1   CBG (last 3)  Recent Labs    02/26/18 1558 02/26/18 2040 02/27/18 0624  GLUCAP 169* 173* 133*    Wt Readings from Last 3 Encounters:  02/26/18 76.1 kg (167 lb 12.3 oz)  02/22/18 75.9 kg (167 lb 5.3 oz)  09/26/16 82.4 kg (181 lb 9.6 oz)    Physical Exam:  BP (!) 176/77 (BP Location: Left Arm)   Pulse 61   Temp 97.9 F (36.6 C) (Oral)   Resp 16   Ht 5\' 5"  (1.651 m)   Wt 76.1 kg (167 lb 12.3 oz)   SpO2 95%   BMI 27.92 kg/m  Constitutional: She appears well-developed and well-nourished.  HENT: Normocephalic and atraumatic.  Eyes: EOM are normal. No discharge.  Cardiovascular: Cardiac rate controlled  Respiratory: Effort normal and breath sounds normal.  GI: Bowel sounds are normal. She exhibits no distension.  Musculoskeletal: LE edema and tenderness  Neurological: She is alert.  Made good eye contact with examiner.   Fair awareness of deficits.   Follow commands.   Motor: RUE/RLE: 4-/5 proximal to distal LUE/LLE: 4-/5 proximal to distal (stronger than right, stable)  Skin: Skin is warm and dry.  Psychiatric: She has a normal mood and affect. Her behavior is normal.   Assessment/Plan: 1. Functional deficits secondary to left thalamic left occipital infarcts as well as intracranial stenosis as well as history of CVA 2017 which require 3+ hours per day of interdisciplinary therapy in a  comprehensive inpatient rehab setting. Physiatrist is providing close team supervision and 24 hour management of active medical problems listed below. Physiatrist and rehab team continue to assess barriers to discharge/monitor patient progress toward functional and medical goals.  Function:  Bathing Bathing position      Bathing parts      Bathing assist        Upper Body Dressing/Undressing Upper body dressing                    Upper body assist        Lower Body Dressing/Undressing Lower body dressing                                  Lower body assist        Toileting Toileting          Toileting assist     Transfers Chair/bed transfer             Locomotion Ambulation           Wheelchair          Cognition Comprehension Comprehension assist level: Follows complex conversation/direction with no assist  Expression Expression assist level: Expresses complex ideas: With no assist  Social Interaction Social Interaction assist level: Interacts appropriately with others - No medications needed.  Problem Solving    Memory      Medical Problem List and Plan: 1.  Gait disorder with dysphagia secondary to left thalamic left occipital infarcts as well as intracranial stenosis on 4/13 as well as history of CVA 2017  Begins CIR 2.  DVT Prophylaxis/Anticoagulation: SCDs.  Vascular study pending 3. Pain Management: Tylenol as needed 4. Mood: Provide emotional support 5. Neuropsych: This patient is ?fully capable of making decisions on her own behalf. 6. Skin/Wound Care: Routine skin checks 7. Fluids/Electrolytes/Nutrition: Routine I/O's    BMP within acceptable range on 4/18 8.  Dysphagia.  Dysphasia #3 nectar liquids.  Follow-up speech therapy   Advance diet as tolerated 9.  Diastolic congestive heart failure.  Monitor for any signs of fluid overload Filed Weights   02/26/18 1846  Weight: 76.1 kg (167 lb 12.3 oz)   10.  Sick  sinus syndrome status post pacemaker.  Follow-up per cardiology services 11.  Diabetes mellitus peripheral neuropathy.  Hemoglobin A1c 6.9.  SSI.  Check blood sugars before meals and at bedtime.    Patient on Amaryl 2 mg daily prior to admission.  Resume as needed  Monitor with increased mobility 12.  Hypertension.  Toprol-XL 100 mg daily, Norvasc 5 mg twice daily.    Monitor with increased mobility 13.  Hyperlipidemia.  Zetia 14. Hypoalbuminemia  Supplement initiated on 4/15  LOS (Days) 1 A FACE TO FACE EVALUATION WAS PERFORMED  Jaymi Tinner Karis Juba 02/27/2018 8:31 AM

## 2018-02-27 NOTE — Care Management Note (Signed)
Inpatient Rehabilitation Center Individual Statement of Services  Patient Name:  Charlene Greene  Date:  02/27/2018  Welcome to the Inpatient Rehabilitation Center.  Our goal is to provide you with an individualized program based on your diagnosis and situation, designed to meet your specific needs.  With this comprehensive rehabilitation program, you will be expected to participate in at least 3 hours of rehabilitation therapies Monday-Friday, with modified therapy programming on the weekends.  Your rehabilitation program will include the following services:  Physical Therapy (PT), Occupational Therapy (OT), Speech Therapy (ST), 24 hour per day rehabilitation nursing, Therapeutic Recreaction (TR), Case Management (Social Worker), Rehabilitation Medicine, Nutrition Services and Pharmacy Services  Weekly team conferences will be held on Wednesday to discuss your progress.  Your Social Worker will talk with you frequently to get your input and to update you on team discussions.  Team conferences with you and your family in attendance may also be held.  Expected length of stay: 12-14 days Overall anticipated outcome: supervision with cueing-some min assist level goals  Depending on your progress and recovery, your program may change. Your Social Worker will coordinate services and will keep you informed of any changes. Your Social Worker's name and contact numbers are listed  below.  The following services may also be recommended but are not provided by the Inpatient Rehabilitation Center:   Home Health Rehabiltiation Services  Outpatient Rehabilitation Services   Arrangements will be made to provide these services after discharge if needed.  Arrangements include referral to agencies that provide these services.  Your insurance has been verified to be:  Medicare & UHC Your primary doctor is:  [facility]  Pertinent information will be shared with your doctor and your insurance  company.  Social Worker:  Dossie DerBecky Austan Nicholl, SW [phone] or ([phone]) [phone]  Information discussed with and copy given to patient by: Lucy Chrisupree, Seaver Machia G, 02/27/2018, 9:34 AM

## 2018-02-27 NOTE — Progress Notes (Signed)
Social Work  Social Work Assessment and Plan  Patient Details  Name: Charlene Greene MRN: [id number] Date of Birth: [birth year]  Today's Date: 02/27/2018  Problem List:  Patient Active Problem List   Diagnosis Date Noted  . Hypoalbuminemia due to protein-calorie malnutrition (HCC)   . Diabetic peripheral neuropathy (HCC)   . Diabetes mellitus type 2 in nonobese (HCC)   . Chronic diastolic congestive heart failure (HCC)   . Left thalamic infarction (HCC) 02/26/2018  . Dysphagia, post-stroke   . Acute on chronic diastolic congestive heart failure (HCC)   . Type 2 diabetes mellitus with peripheral neuropathy (HCC)   . TIA (transient ischemic attack) 02/22/2018  . Labile blood glucose   . Hypotension due to drugs   . Hyponatremia   . Renovascular hypertension   . Hypertensive crisis   . Type 2 diabetes mellitus with diabetic autonomic neuropathy, without long-term current use of insulin (HCC)   . Stage 3 chronic kidney disease (HCC)   . Acute lower UTI   . Acute renal insufficiency 09/27/2016  . Urinary urgency 09/27/2016  . Basal ganglia infarction (HCC) 09/26/2016  . Neuropathic pain   . Benign essential HTN   . Slow transit constipation   . Elevated creatine kinase   . Accelerated hypertension 09/24/2016  . Diabetes mellitus (HCC) 09/24/2016  . Hyperlipidemia 09/24/2016  . Sick sinus syndrome (HCC) 09/24/2016  . CVA (cerebral vascular accident) (HCC) 09/23/2016  . Stroke (cerebrum) (HCC) 09/23/2016   Past Medical History:  Past Medical History:  Diagnosis Date  . CAD (coronary artery disease)   . Diabetes mellitus without complication (HCC)   . Hyperlipidemia   . Hypertension   . Pacemaker   . Renal artery stenosis (HCC)    s/p bilateral stents  . Sick sinus syndrome (HCC)   . Stroke [facility](HCC)    2 previous cva's   Past Surgical History:  Past Surgical History:  Procedure Laterality Date  . PACEMAKER PLACEMENT  05/15/2014   MDT Advis[facility] for  sick sinus syndrome   Social History:  reports that she has never smoked. She has never used smokeless tobacco. She reports that she does not drink alcohol or use drugs.  Family / Support Systems Marital Status: Widow/Widower How Long?: 3 years Patient Roles: Parent Children: Marcheta GrammesRon Aldridge-son [phone]-cell Delfin GantGary Aldridge-son [phone]-cell Other Supports: Denise-daughter in-law [phone] Anticipated Caregiver: [facility] Ability/Limitations of Caregiver: [facility] she [facility] and she can pay for a different tier of care Caregiver Availability: 24/7 Family Dynamics: Close knit with tow son's both are local and involved and supportive. [facility] and has been there for 8 months now. She feels she receives good care.   Social History Preferred language: English Religion: [employer] [place] Cultural Background: No issues Education: [employer] Read: Yes Write: Yes Employment Status: Retired Fish farm managerLegal Hisotry/Current Legal Issues: No issues Guardian/Conservator: [name]-son is her POA, MD here does feel pt is capable of making her own decisions while here. Will make sure [name] is present if any decisions need to be made while here   Abuse/Neglect Abuse/Neglect Assessment Can Be Completed: Yes Physical Abuse: Denies Verbal Abuse: Denies Sexual Abuse: Denies Exploitation of patient/patient's resources: Denies Self-Neglect: Denies  Emotional Status Pt's affect, behavior adn adjustment status: Pt is motivated to do well and has been here in 2017 after her first stroke. She is hopeful her swallowing will get better she does not like the thicken  liquid. She does her best and will continue to to do her best until God is ready to take her. Recent Psychosocial Issues: other health issues-were managed and she was getting along well prior to this stroke Pyschiatric History: No history deferred depression screen due to  coping well and able to verbalize her feelings and has a strong faith which pulls her through. She feels at her age you are at the end and just need to make the best of it, until God is ready for you. Substance Abuse History: No issues  Patient / Family Perceptions, Expectations & Goals Pt/Family understanding of illness & functional limitations: Pt and son's are able to explain her stroke and deficits. They want the best for her and will do waht they need to to provide this. They all talk with the MD and feel they have a good understanding of her treatment plan going forward.  Premorbid pt/family roles/activities: Mom, grandmother, retiree, church member, etc Anticipated changes in roles/activities/participation: resume Pt/family expectations/goals: Pt states: " I hope to do well with my swallow and get back on a regular diet."  Son states: " I hope she can eat what she wants and improves while here, but will do whatever is needed."  [employer]: Other (Comment)(resident of [place]) Premorbid Home Care/DME Agencies: Other (Comment)(has had therapies after last stroke) Transportation available at discharge: Family or facility Resource referrals recommended: Support group (specify)  Discharge Planning Living Arrangements: Other (Comment)([facility]-AL[facility]) Support Systems: Children, Friends/neighbors, Church/faith community Type of Residence: Assisted living Insurance Resources: [employer], Media planner (specify)(UHC) Financial Resources: Restaurant manager, fast food Screen Referred: No Living Expenses: Rent Money Management: Family Does the patient have any problems obtaining your medications?: No Home Management: Facility Patient/Family Preliminary Plans: Return to [facility] where she has been living the past 8 months. She likes it there and they can meet her needs even if she will require more care upon discharge from CIR. Will work  together on her discharge needs. Social Work Anticipated Follow Up Needs: ALF/IL, Support Group  Clinical Impression Pleasant elderly female who is trying to make the best of it and live her life the best that she can. She is motivated and will do her best. Her family is involved and very supportive and will make sure she has whatever she needs. Will coordinate with [facility] what services she will require and work on discharge plans. Await therapy team evaluations Lucy Chris 02/27/2018, 9:32 AM

## 2018-02-27 NOTE — Plan of Care (Signed)
  Problem: RH SKIN INTEGRITY Goal: RH STG SKIN FREE OF INFECTION/BREAKDOWN Outcome: Progressing   Problem: RH SAFETY Goal: RH STG ADHERE TO SAFETY PRECAUTIONS W/ASSISTANCE/DEVICE Description STG Adhere to Safety Precautions With Assistance/Device.with mod assist  Outcome: Progressing Goal: RH STG DECREASED RISK OF FALL WITH ASSISTANCE Description STG Decreased Risk of Fall With Assistance.with mod assist  Outcome: Progressing   Problem: RH SAFETY Goal: RH STG ADHERE TO SAFETY PRECAUTIONS W/ASSISTANCE/DEVICE Description STG Adhere to Safety Precautions With Assistance/Device.with mod assist  Outcome: Progressing   Problem: RH PAIN MANAGEMENT Goal: RH STG PAIN MANAGED AT OR BELOW PT'S PAIN GOAL Outcome: Progressing   Problem: RH KNOWLEDGE DEFICIT GENERAL Goal: RH STG INCREASE KNOWLEDGE OF SELF CARE AFTER HOSPITALIZATION Outcome: Progressing

## 2018-02-27 NOTE — Evaluation (Signed)
Physical Therapy Assessment and Plan  Patient Details  Name: Charlene Greene MRN: [id number] Date of Birth: September 25, 1922  PT Diagnosis: Abnormal posture, Abnormality of gait, Difficulty walking, Hemiparesis non-dominant, Impaired cognition and Muscle weakness Rehab Potential: Good ELOS: 12-14 days   Today's Date: 02/27/2018 PT Individual Time: 0800-0900 PT Individual Time Calculation (min): 60 min    Problem List:  Patient Active Problem List   Diagnosis Date Noted  . Hypoalbuminemia due to protein-calorie malnutrition ([place])   . Diabetic peripheral neuropathy ([place])   . Diabetes mellitus type 2 in nonobese (HCC)   . Chronic diastolic congestive heart failure ([place])   . Left thalamic infarction ([place]) 02/26/2018  . Dysphagia, post-stroke   . Acute on chronic diastolic congestive heart failure ([place])   . Type 2 diabetes mellitus with peripheral neuropathy (HCC)   . TIA (transient ischemic attack) 02/22/2018  . Labile blood glucose   . Hypotension due to drugs   . Hyponatremia   . Renovascular hypertension   . Hypertensive crisis   . Type 2 diabetes mellitus with diabetic autonomic neuropathy, without long-term current use of insulin ([place])   . Stage 3 chronic kidney disease ([place])   . Acute lower UTI   . Acute renal insufficiency 09/27/2016  . Urinary urgency 09/27/2016  . Basal ganglia infarction ([place]) 09/26/2016  . Neuropathic pain   . Benign essential HTN   . Slow transit constipation   . Elevated creatine kinase   . Accelerated hypertension 09/24/2016  . Diabetes mellitus ([place]) 09/24/2016  . Hyperlipidemia 09/24/2016  . Sick sinus syndrome ([place]) 09/24/2016  . CVA (cerebral vascular accident) ([place]) 09/23/2016  . Stroke (cerebrum) ([place]) 09/23/2016    Past Medical History:  Past Medical History:  Diagnosis Date  . CAD (coronary artery disease)   . Diabetes mellitus without complication ([place])   . Hyperlipidemia   . Hypertension   . Pacemaker   . Renal artery stenosis (HCC)     s/p bilateral stents  . Sick sinus syndrome ([place])   . Stroke [facility])    2 previous cva's   Past Surgical History:  Past Surgical History:  Procedure Laterality Date  . PACEMAKER PLACEMENT  05/15/2014   MDT Advisa MRI implanted at [facility] for sick sinus syndrome    Assessment & Plan Clinical Impression: Charlene Greene is a 84 y.o. right-handed female with history of CVA 2017 maintained aspirin and plavix, diabetes mellitus, hypertension, sick sinus syndrome with pacemaker, diastolic congestive heart failure.  Patient received inpatient rehab services November 2017 for right basal ganglia infarction secondary to small vessel disease.  Per chart review patient is a resident of assisted living facility [facility].  Patient does receive assistance for some bathing and dressing.  Her meals are provided.  She uses a walker and wheelchair at the assisted living facility.  Presented 02/22/2018 with altered mental status.Cranial CT negative.  MRI showed patchy small volume acute ischemic nonhemorrhagic infarct involving the left thalamus, left occipital lobe and left temporal lobe, left PCA territory.  MRA of the neck was negative.  MRI of the head with extensive atherosclerotic changes.  Severe near occlusion right A2 stenosis, chronic right M2 occlusion.  Echocardiogram with ejection fraction of 19% grade 1 diastolic dysfunction.  Neurology consulted presently on aspirin and Plavix recommendations for 3 months then aspirin alone.  Cardiology follow-up pacemaker interrogated showing no signs of A. fib.  Presently on a dysphagia #3 nectar thick liquid diet.  Physical therapy evaluation completed.  Family has requested rehab consult  for opportunity to return back to assisted living facility.  Patient complains of bilateral lower extremity pain which is fairly constant.  Hip x-rays 02/23/2018 were unremarkable.  She states that she does not feel the pain is in her joints but her entire leg. Patient transferred  to CIR on 02/26/2018 .   Patient currently requires mod with mobility secondary to muscle weakness, decreased cardiorespiratoy endurance, unbalanced muscle activation, decreased coordination and decreased motor planning and decreased sitting balance, decreased standing balance, decreased postural control, hemiplegia and decreased balance strategies.  Prior to hospitalization, patient was modified independent  using rollator for short distances in apartment, dependently transferred in w/c to/from community activities and lived in an Assisted living home.  Home access is  Level entry, Elevator.  Patient will benefit from skilled PT intervention to maximize safe functional mobility, minimize fall risk and decrease caregiver burden for planned discharge home with 24 hour supervision.  Anticipate patient will benefit from follow up [place] at discharge.  PT - End of Session Activity Tolerance: Tolerates 10 - 20 min activity with multiple rests Endurance Deficit: Yes Endurance Deficit Description: fatigues quickly with mobility activities PT Assessment Rehab Potential (ACUTE/IP ONLY): Good PT Patient demonstrates impairments in the following area(s): Balance;Endurance;Motor;Safety;Skin Integrity PT Transfers Functional Problem(s): Bed Mobility;Bed to Chair;Furniture PT Locomotion Functional Problem(s): Ambulation PT Plan PT Intensity: Minimum of 1-2 x/day ,45 to 90 minutes PT Frequency: 5 out of 7 days PT Duration Estimated Length of Stay: 12-14 days PT Treatment/Interventions: Ambulation/gait training;Discharge planning;Functional mobility training;Psychosocial support;Therapeutic Activities;Therapeutic Exercise;Skin care/wound management;Neuromuscular re-education;Disease management/prevention;Balance/vestibular training;Wheelchair propulsion/positioning;UE/LE Strength taining/ROM;Pain management;DME/adaptive equipment instruction;Cognitive remediation/compensation;Community reintegration;Patient/family  education;UE/LE Coordination activities PT Transfers Anticipated Outcome(s): S PT Locomotion Anticipated Outcome(s): S with LRAD short distances in apartment PT Recommendation Follow Up Recommendations: Home health PT Patient destination: Assisted Living Equipment Recommended: To be determined Equipment Details: has rollator  Skilled Therapeutic Intervention Pt received seated in bed, upset she has not gotten up to eat yet. Therapist obtained w/c and cushion; transferred to w/c and allowed pt to eat breakfast while therapist obtained information regarding home environment, daily activities and goals. Requires several cues for smaller bites, extra swallows with pt demonstrating several coughing episodes while eating. PT initial evaluation performed and completed as described below with mod/maxA overall d/t L hemiparesis, decreased aerobic endurance. Educated pt on rehab process, goals, estimated LOS to be discussed with team after evaluations completed. Remained seated in w/c at end of session, chair alarm and quick release belt intact, all needs in reach.    PT Evaluation Precautions/Restrictions Precautions Precautions: Fall Precaution Comments: L hemiparesis Restrictions Weight Bearing Restrictions: No General Chart Reviewed: Yes PT Missed Treatment Reason: Not applicable Response to Previous Treatment: Not applicable Family/Caregiver Present: No Vital SignsTherapy Vitals Temp: 97.9 F (36.6 C) Temp Source: Oral Pulse Rate: 61 Resp: 16 BP: (!) 176/77 Patient Position (if appropriate): Lying Oxygen Therapy SpO2: 95 % O2 Device: Room Air Pain Pain Assessment Pain Scale: 0-10 Pain Score: 5  Pain Type: Acute pain Pain Location: Leg Pain Orientation: Right;Left Pain Descriptors / Indicators: Aching Pain Frequency: Intermittent Pain Onset: Gradual Patients Stated Pain Goal: 2 Pain Intervention(s): Medication (See eMAR);Emotional support Multiple Pain Sites: No Home  Living/Prior Functioning Home Living Available Help at Discharge: Personal care attendant Type of Home: Assisted living Home Access: Level entry;Elevator Home Layout: One level Additional Comments: Lives in assisted living facility with assistance for bathing and dressing per pt report. Makes small snacks for herself but has meals provided. Transported dependently in  w/c to community activities  Lives With: Other (Comment) Prior Function Level of Independence: Needs assistance with homemaking;Needs assistance with ADLs;Independent with transfers;Independent with gait(modI with rollator within apartment)  Able to Take Stairs?: No Driving: No Vocation: Retired Leisure: Hobbies-yes (Comment) Comments: enjoys bingo a few times a week, eats lunch with friends Vision/Perception  Praxis Praxis: Intact  Cognition Orientation Level: Disoriented to time Sensation Sensation Light Touch: Appears Intact Stereognosis: Not tested Hot/Cold: Not tested Proprioception: Appears Intact Coordination Gross Motor Movements are Fluid and Coordinated: No Fine Motor Movements are Fluid and Coordinated: Yes Coordination and Movement Description: Pt with BUE shoulder weakness, with left shoulder being more limited than right.  She needed one handed technique for washing under the right arm during selfcare task. Motor  Motor Motor: Abnormal postural alignment and control Motor - Skilled Clinical Observations: flexed trunk and lean to the right in standing  Mobility Bed Mobility Bed Mobility: Supine to Sit Supine to Sit: 3: Mod assist;With rails;HOB elevated Supine to Sit Details: Verbal cues for technique;Verbal cues for precautions/safety;Tactile cues for weight shifting;Tactile cues for placement Supine to Sit Details (indicate cue type and reason): assist for bringing trunk to upright Transfers Transfers: Yes Sit to Stand: 3: Mod assist;With upper extremity assist Sit to Stand Details: Verbal cues  for technique;Verbal cues for precautions/safety;Tactile cues for weight shifting;Tactile cues for placement Stand Pivot Transfers: 3: Mod assist;With armrests Stand Pivot Transfer Details: Verbal cues for technique;Verbal cues for precautions/safety;Tactile cues for placement;Verbal cues for gait pattern;Tactile cues for weight shifting Stand Pivot Transfer Details (indicate cue type and reason): to stronger R side Locomotion  Ambulation Ambulation: Yes Ambulation/Gait Assistance: 4: Min assist;3: Mod assist Ambulation Distance (Feet): 25 Feet Assistive device: Other (Comment)(R rail in hall) Ambulation/Gait Assistance Details: Verbal cues for technique;Verbal cues for precautions/safety;Tactile cues for placement;Tactile cues for sequencing;Tactile cues for weight shifting;Manual facilitation for weight shifting;Verbal cues for gait pattern Gait Gait: Yes Gait Pattern: Impaired Gait Pattern: Narrow base of support;Poor foot clearance - left;Lateral hip instability;Decreased trunk rotation;Trunk flexed;Decreased stride length;Step-through pattern;Decreased weight shift to left Gait velocity: significantly decreased Stairs / Additional Locomotion Stairs: No Wheelchair Mobility Wheelchair Mobility: No  Trunk/Postural Assessment  Cervical Assessment Cervical Assessment: Exceptions to WFL(mild forward head posture) Thoracic Assessment Thoracic Assessment: Exceptions to WFL(increased kyphosis) Lumbar Assessment Lumbar Assessment: Exceptions to WFL(reduced lumbar lordosis, posterior pelvic tilt) Postural Control Postural Control: Deficits on evaluation(delayed righting reactions, absent stepping strategies)  Balance Balance Balance Assessed: Yes Static Sitting Balance Static Sitting - Balance Support: Feet supported Static Sitting - Level of Assistance: 5: Stand by assistance Dynamic Sitting Balance Dynamic Sitting - Balance Support: Feet supported;During functional activity Dynamic  Sitting - Level of Assistance: 4: Min assist Static Standing Balance Static Standing - Balance Support: During functional activity Static Standing - Level of Assistance: 4: Min assist Dynamic Standing Balance Dynamic Standing - Balance Support: During functional activity Dynamic Standing - Level of Assistance: 3: Mod assist Extremity Assessment  RUE Assessment RUE Assessment: Exceptions to [facility] shoulder flexion 0-80 degrees.  All other joints AROM WFLS with strength 4/5 throughout.) LUE Assessment LUE Assessment: Exceptions to [facility] shoulder flexion 0-70 degrees, WFLS for elbow flexion/extension and digit flexion/extension.  Shoulder strength 3-/5, elbow 3+/5, grip 4/5) RLE Assessment RLE Assessment: Exceptions to WFL(grossly 4/5 to 4+/5 throughout) LLE Assessment LLE Assessment: Exceptions to WFL(4-/5 hip flexion, knee flexion/extension, 4/5 dorsiflexion/plantarflexion)   See Function Navigator for Current Functional Status.   Refer to Care Plan for Long Term Goals  Recommendations  for other services: Therapeutic Recreation  Kitchen group  Discharge Criteria: Patient will be discharged from PT if patient refuses treatment 3 consecutive times without medical reason, if treatment goals not met, if there is a change in medical status, if patient makes no progress towards goals or if patient is discharged from hospital.  The above assessment, treatment plan, treatment alternatives and goals were discussed and mutually agreed upon: by patient  Luberta Mutter 02/27/2018, 9:21 AM

## 2018-02-27 NOTE — Progress Notes (Signed)
Patient transported via bed to Vascular Lab for US BLE.

## 2018-02-27 NOTE — Progress Notes (Signed)
Preliminary results-- Bilateral lower extremities venous duplex study completed. Negative for DVT.   Incidental findings: 1, Complex fluid collection noted in the popliteal fossa bilaterally. 2, Distal calf on left: there is an anechoic intra-muscular structure seen. 3, Mild edema seen. 4, Calcification in the femoral artery lumen with no flow detected bilaterally.  Hongying Darvis Croft (RDMS RVT) 02/27/18 2:02 PM

## 2018-02-27 NOTE — Evaluation (Signed)
Occupational Therapy Assessment and Plan  Patient Details  Name: Faiza Bansal MRN: [id number] Date of Birth: [birth year]  OT Diagnosis: abnormal posture, cognitive deficits and muscle weakness (generalized) Rehab Potential: Rehab Potential (ACUTE ONLY): Good ELOS: 12-14 days   Today's Date: 02/27/2018 OT Individual Time: 2542-7062 OT Individual Time Calculation (min): 59 min     Problem List:  Patient Active Problem List   Diagnosis Date Noted  . Hypoalbuminemia due to protein-calorie malnutrition ([place])   . Diabetic peripheral neuropathy ([place])   . Diabetes mellitus type 2 in nonobese (HCC)   . Chronic diastolic congestive heart failure ([place])   . Left thalamic infarction ([place]) 02/26/2018  . Dysphagia, post-stroke   . Acute on chronic diastolic congestive heart failure ([place])   . Type 2 diabetes mellitus with peripheral neuropathy (HCC)   . TIA (transient ischemic attack) 02/22/2018  . Labile blood glucose   . Hypotension due to drugs   . Hyponatremia   . Renovascular hypertension   . Hypertensive crisis   . Type 2 diabetes mellitus with diabetic autonomic neuropathy, without long-term current use of insulin ([place])   . Stage 3 chronic kidney disease ([place])   . Acute lower UTI   . Acute renal insufficiency 09/27/2016  . Urinary urgency 09/27/2016  . Basal ganglia infarction ([place]) 09/26/2016  . Neuropathic pain   . Benign essential HTN   . Slow transit constipation   . Elevated creatine kinase   . Accelerated hypertension 09/24/2016  . Diabetes mellitus ([place]) 09/24/2016  . Hyperlipidemia 09/24/2016  . Sick sinus syndrome ([place]) 09/24/2016  . CVA (cerebral vascular accident) ([place]) 09/23/2016  . Stroke (cerebrum) ([place]) 09/23/2016    Past Medical History:  Past Medical History:  Diagnosis Date  . CAD (coronary artery disease)   . Diabetes mellitus without complication ([place])   . Hyperlipidemia   . Hypertension   . Pacemaker   . Renal artery stenosis (HCC)    s/p bilateral  stents  . Sick sinus syndrome ([place])   . Stroke [facility])    2 previous cva's   Past Surgical History:  Past Surgical History:  Procedure Laterality Date  . PACEMAKER PLACEMENT  05/15/2014   MDT Advisa MRI implanted at [facility] for sick sinus syndrome    Assessment & Plan Clinical Impression: Patient is a 84 y.o. year old female with recent admission to the hospital on 02/22/2018 with altered mental status.  Cranial CT reviewed, unremarkable for acute intracranial process. MRI showed patchy small volume acute ischemic nonhemorrhagic infarction involving the left thalamus, left occipital lobe and left temporal lobe, left PCA territory.  Patient transferred to CIR on 02/26/2018 .    Patient currently requires mod with basic self-care skills secondary to muscle weakness, unbalanced muscle activation, decreased problem solving and decreased memory and decreased standing balance, decreased postural control and decreased balance strategies.  Prior to hospitalization, patient could complete ADLs with min.  Patient will benefit from skilled intervention to decrease level of assist with basic self-care skills and increase independence with basic self-care skills prior to discharge home with care partner.  Anticipate patient will require 24 hour supervision and follow up home health.  OT - End of Session Activity Tolerance: Decreased this session Endurance Deficit: Yes Endurance Deficit Description: fatigues quickly with mobility activities OT Assessment Rehab Potential (ACUTE ONLY): Good OT Barriers to Discharge: Decreased caregiver support OT Barriers to Discharge Comments: Will need 24 hour assist with return to ALF, unsure if this can be provided or not.  OT Patient demonstrates impairments in the following area(s): Balance;Cognition;Endurance;Motor;Pain;Safety OT Basic ADL's Functional Problem(s): Grooming;Bathing;Dressing;Toileting OT Transfers Functional Problem(s): Toilet;Tub/Shower OT Plan OT  Intensity: Minimum of 1-2 x/day, 45 to 90 minutes OT Frequency: 5 out of 7 days OT Duration/Estimated Length of Stay: 12-14 days OT Treatment/Interventions: Balance/vestibular training;Cognitive remediation/compensation;Community reintegration;DME/adaptive equipment instruction;Neuromuscular re-education;UE/LE Strength taining/ROM;Therapeutic Exercise;Patient/family education;Functional mobility training;Discharge planning;Pain management;Self Care/advanced ADL retraining;Therapeutic Activities;UE/LE Coordination activities OT Self Feeding Anticipated Outcome(s): modified independent OT Basic Self-Care Anticipated Outcome(s): min assist OT Toileting Anticipated Outcome(s): supervision OT Bathroom Transfers Anticipated Outcome(s): supervision to min assist OT Recommendation Patient destination: Assisted Living Follow Up Recommendations: 24 hour supervision/assistance;Home health OT Equipment Recommended: To be determined   Skilled Therapeutic Intervention Pt worked on selfcare retraining sit to stand at the sink during session.  Pt used both UEs spontaneously during bathing but did demonstrate limited AROM in the left shoulder when attempting to wash under the right arm.  She instead was able to initiate and use one handed technique with the RUE.  Mod assist for sit to stand with LB bathing.  Max assist for donning all LB clothing this session, except for gripper socks, which she was able to donn with min assist.   Mod assist for simulated stand pivot transfer to the [facility] as well.  Pt left in the wheelchair at the end of the session with call button and phone in reach.    OT Evaluation Precautions/Restrictions  Precautions Precautions: Fall Precaution Comments: L hemiparesis Restrictions Weight Bearing Restrictions: No  Pain Pain Assessment Pain Scale: 0-10 Pain Score: 0-No pain Home Living/Prior Functioning Home Living Family/patient expects to be discharged to:: Assisted living Living  Arrangements: Other (Comment)([facility]-ALF) Available Help at Discharge: Other (Comment)(assisted living facility ) Type of Home: Assisted living Home Access: Level entry, Elevator Home Layout: One level Bathroom Shower/Tub: Multimedia programmer: Handicapped height Bathroom Accessibility: Yes Additional Comments: Lives in assisted living facility with assistance for bathing and dressing per pt report. Makes small snacks for herself but has meals provided. Transported dependently in w/c to community activities  Lives With: Other (Comment)(assisted living facility ) IADL History Homemaking Responsibilities: No Current License: No Occupation: Retired Prior Function Level of Independence: Needs assistance with homemaking, Needs assistance with ADLs, Independent with transfers, Independent with gait(modI with rollator within apartment)  Able to Take Stairs?: No Driving: No Vocation: Retired Leisure: Hobbies-yes (Comment) Comments: enjoys bingo a few times a week, eats lunch with friends ADL  See Function Section of chart for details  Vision Baseline Vision/History: Wears glasses Wears Glasses: At all times Patient Visual Report: (Pt reports blurring of distal vision) Eye Alignment: Within Functional Limits Ocular Range of Motion: Restricted looking up Alignment/Gaze Preference: Within Defined Limits Tracking/Visual Pursuits: Other (comment)(Lost fixation at one time when tracking to the left.  She was not able to scan superiorly with either eye) Convergence: Within functional limits Visual Fields: No apparent deficits Perception  Perception: Within Functional Limits Praxis Praxis: Intact Cognition Overall Cognitive Status: Impaired/Different from baseline Arousal/Alertness: Awake/alert Orientation Level: Person;Place;Situation Person: Oriented Place: Oriented Situation: Oriented Year: 2019 Month: June Day of Week: Incorrect Memory: Appears intact Memory  Impairment: Decreased recall of new information Immediate Memory Recall: Sock;Blue;Bed Memory Recall: Sock;Bed Memory Recall Sock: Without Cue Memory Recall Bed: With Cue Attention: Sustained Sustained Attention: Appears intact Selective Attention: Appears intact Selective Attention Impairment: Functional basic;Verbal basic Awareness: Appears intact Awareness Impairment: Emergent impairment Problem Solving: Impaired Problem Solving Impairment: Functional basic;Functional complex Safety/Judgment: Impaired Comments: Pt  unable to state any deficits from previous CVA with relation to weakness.   Sensation Sensation Light Touch: Appears Intact Stereognosis: Not tested Hot/Cold: Not tested Proprioception: Appears Intact Coordination Gross Motor Movements are Fluid and Coordinated: No Fine Motor Movements are Fluid and Coordinated: Yes Coordination and Movement Description: Pt with BUE shoulder weakness, with left shoulder being more limited than right.  She needed one handed technique for washing under the right arm during selfcare task. Motor  Motor Motor: Abnormal postural alignment and control Motor - Skilled Clinical Observations: flexed trunk and lean to the right in standing Mobility    See Function Section of chart for details  Trunk/Postural Assessment  Cervical Assessment Cervical Assessment: Exceptions to WFL(forward head) Thoracic Assessment Thoracic Assessment: Exceptions to WFL(thoracic kyphosis) Lumbar Assessment Lumbar Assessment: Exceptions to WFL(lumbar flexion with posterior pelvic tilt) Postural Control Postural Control: Deficits on evaluation  Balance Balance Balance Assessed: Yes Static Sitting Balance Static Sitting - Balance Support: Feet supported Static Sitting - Level of Assistance: 5: Stand by assistance Dynamic Sitting Balance Dynamic Sitting - Balance Support: Feet supported;During functional activity Dynamic Sitting - Level of Assistance: 4:  Min assist Static Standing Balance Static Standing - Balance Support: During functional activity Static Standing - Level of Assistance: 4: Min assist Dynamic Standing Balance Dynamic Standing - Balance Support: During functional activity Dynamic Standing - Level of Assistance: 3: Mod assist Extremity/Trunk Assessment RUE Assessment RUE Assessment: Exceptions to [facility] shoulder flexion 0-80 degrees.  All other joints AROM WFLS with strength 4/5 throughout.) LUE Assessment LUE Assessment: Exceptions to [facility] shoulder flexion 0-70 degrees, WFLS for elbow flexion/extension and digit flexion/extension.  Shoulder strength 3-/5, elbow 3+/5, grip 4/5)   See Function Navigator for Current Functional Status.   Refer to Care Plan for Long Term Goals  Recommendations for other services: None    Discharge Criteria: Patient will be discharged from OT if patient refuses treatment 3 consecutive times without medical reason, if treatment goals not met, if there is a change in medical status, if patient makes no progress towards goals or if patient is discharged from hospital.  The above assessment, treatment plan, treatment alternatives and goals were discussed and mutually agreed upon: by patient  Kai Calico OTR/L 02/27/2018, 4:27 PM

## 2018-02-28 ENCOUNTER — Inpatient Hospital Stay (HOSPITAL_COMMUNITY): Payer: Medicare Other | Admitting: Physical Therapy

## 2018-02-28 ENCOUNTER — Inpatient Hospital Stay (HOSPITAL_COMMUNITY): Payer: Medicare Other | Admitting: Occupational Therapy

## 2018-02-28 ENCOUNTER — Inpatient Hospital Stay (HOSPITAL_COMMUNITY): Payer: Medicare Other | Admitting: Speech Pathology

## 2018-02-28 DIAGNOSIS — I739 Peripheral vascular disease, unspecified: Secondary | ICD-10-CM

## 2018-02-28 DIAGNOSIS — I10 Essential (primary) hypertension: Secondary | ICD-10-CM

## 2018-02-28 DIAGNOSIS — R7309 Other abnormal glucose: Secondary | ICD-10-CM

## 2018-02-28 LAB — GLUCOSE, CAPILLARY
GLUCOSE-CAPILLARY: 152 mg/dL — AB (ref 65–99)
GLUCOSE-CAPILLARY: 152 mg/dL — AB (ref 65–99)
GLUCOSE-CAPILLARY: 230 mg/dL — AB (ref 65–99)
Glucose-Capillary: 165 mg/dL — ABNORMAL HIGH (ref 65–99)

## 2018-02-28 MED ORDER — INSULIN ASPART 100 UNIT/ML ~~LOC~~ SOLN
0.0000 [IU] | Freq: Three times a day (TID) | SUBCUTANEOUS | Status: DC
  Administered 2018-02-28: 2 [IU] via SUBCUTANEOUS

## 2018-02-28 NOTE — Progress Notes (Signed)
Physical Therapy Session Note  Patient Details  Name: Charlene Greene Iovine MRN: [id number] Date of Birth: 29-Apr-1922  Today's Date: 02/28/2018 PT Individual Time: 0800-0900 PT Individual Time Calculation (min): 60 min   Short Term Goals: Week 1:  PT Short Term Goal 1 (Week 1): Pt will perform bed mobility minA from hospital bed PT Short Term Goal 2 (Week 1): Pt will perform stand pivot transfer minA  Skilled Therapeutic Interventions/Progress Updates: Pt received seated in bed finishing breakfast; c/o pain 4/10 in BLEs and agreeable to treatment. Rolling R/LminA to change brief totalA after incontinent bowel movement. Supine>sit with HOB elevated and modA. Upper body dressing seated on EOB with modA for pulling shirt over head and down trunk. TotalA for pants, socks, shoes with pt attempting to assist with pulling up pants with RUE in standing. Stand pivot transfer to R into w/c modA with multiple attempts sit >stand before successfully able to complete transfer. Performs hygiene at sink with setupA. Unable to stand pivot from w/c>mat; performed modA squat pivot instead after unsuccessful attempts sit>stand. From mat table pt able to perform sit >stand min guard faded to S from elevated table height, gradually reduced to lowest height. Gait x25' with RW and minA/min guard; very slow speed and pt easily distracted by surroundings. Tactile cueing to L glutes during stance. Remained seated in w/c at end of session, chair alarm and quick release belt intact, all needs in reach.      Therapy Documentation Precautions:  Precautions Precautions: Fall Precaution Comments: L hemiparesis Restrictions Weight Bearing Restrictions: No   See Function Navigator for Current Functional Status.   Therapy/Group: Individual Therapy  Vista Lawmanlizabeth J Tygielski 02/28/2018, 9:21 AM

## 2018-02-28 NOTE — Plan of Care (Signed)
  Problem: Consults Goal: Nutrition Consult-if indicated Outcome: Progressing   Problem: RH BOWEL ELIMINATION Goal: RH STG MANAGE BOWEL WITH ASSISTANCE Description STG Manage Bowel with Assistance. Moderate assist  Outcome: Progressing Flowsheets (Taken 02/28/2018 1402) STG: Pt will manage bowels with assistance: 6-Modified independent Goal: RH STG MANAGE BOWEL W/MEDICATION W/ASSISTANCE Description STG Manage Bowel with Medication with Assistance. Outcome: Progressing Flowsheets (Taken 02/28/2018 1402) STG: Pt will manage bowels with medication with assistance: 6-Modified independent Goal: RH STG MANAGE BOWEL W/EQUIPMENT W/ASSISTANCE Description STG Manage Bowel With Equipment With Assistance BSC with mod assist   Outcome: Progressing Flowsheets (Taken 02/28/2018 1402) STG: Pt will manage bowels with equipment with assistance: 6-Modified independent   Problem: RH BLADDER ELIMINATION Goal: RH STG MANAGE BLADDER WITH ASSISTANCE Description STG Manage Bladder With Assistance mod assist  Outcome: Progressing Goal: RH STG MANAGE BLADDER WITH EQUIPMENT WITH ASSISTANCE Description STG Manage Bladder With Equipment With Assistance Outcome: Progressing Flowsheets (Taken 02/28/2018 1402) STG: Pt will manage bladder with equipment with assistance: 6-Modified independent   Problem: RH SKIN INTEGRITY Goal: RH STG SKIN FREE OF INFECTION/BREAKDOWN Outcome: Progressing Goal: RH STG MAINTAIN SKIN INTEGRITY WITH ASSISTANCE Description STG Maintain Skin Integrity With Assistance.Minimal assist  Outcome: Progressing Flowsheets (Taken 02/28/2018 1402) STG: Maintain skin integrity with assistance: 4-Minimal assistance   Problem: RH SAFETY Goal: RH STG ADHERE TO SAFETY PRECAUTIONS W/ASSISTANCE/DEVICE Description STG Adhere to Safety Precautions With Assistance/Device.with mod assist  Outcome: Progressing Flowsheets (Taken 02/28/2018 1402) STG:Pt will adhere to safety precautions with  assistance/device: 4-Minimal assistance Goal: RH STG DECREASED RISK OF FALL WITH ASSISTANCE Description STG Decreased Risk of Fall With Assistance.with mod assist  Outcome: Progressing Flowsheets (Taken 02/28/2018 1402) RUE:[id number]:Decreased risk of fall  with assistance/device: 4-Minimal assistance   Problem: RH PAIN MANAGEMENT Goal: RH STG PAIN MANAGED AT OR BELOW PT'S PAIN GOAL Outcome: Progressing   Problem: RH KNOWLEDGE DEFICIT GENERAL Goal: RH STG INCREASE KNOWLEDGE OF SELF CARE AFTER HOSPITALIZATION Outcome: Progressing

## 2018-02-28 NOTE — Progress Notes (Signed)
[facility] PHYSICAL MEDICINE & REHABILITATION     PROGRESS NOTE  Subjective/Complaints:  Pt seen lying in bed this AM.  She states she did not sleep well overnight because she was warm, but then slept well after taking off her covers. She states she had a good first day of therapies yesterday.  ROS: Denies CP, SOB, nausea, vomiting, diarrhea.  Objective: Vital Signs: Blood pressure (!) 162/70, pulse 61, temperature 98.6 F (37 C), temperature source Oral, resp. rate (!) 21, height 5\' 5"  (1.651 m), weight 76.1 kg (167 lb 12.3 oz), SpO2 96 %. No results found. Recent Labs    02/27/18 0618  WBC 6.3  HGB 13.5  HCT 42.8  PLT 223   Recent Labs    02/26/18 0546 02/27/18 0618  NA 140 142  K 3.6 4.0  CL 103 105  GLUCOSE 153* 156*  BUN 16 21*  CREATININE 0.83 0.95  CALCIUM 9.1 9.1   CBG (last 3)  Recent Labs    02/27/18 2056 02/28/18 0622 02/28/18 1142  GLUCAP 284* 152* 152*    Wt Readings from Last 3 Encounters:  02/26/18 76.1 kg (167 lb 12.3 oz)  02/22/18 75.9 kg (167 lb 5.3 oz)  09/26/16 82.4 kg (181 lb 9.6 oz)    Physical Exam:  BP (!) 162/70 (BP Location: Left Arm)   Pulse 61   Temp 98.6 F (37 C) (Oral)   Resp (!) 21   Ht 5\' 5"  (1.651 m)   Wt 76.1 kg (167 lb 12.3 oz)   SpO2 96%   BMI 27.92 kg/m  Constitutional: She appears well-developed and well-nourished.  HENT: Normocephalic and atraumatic.  Eyes: EOM are normal. No discharge.  Cardiovascular: Cardiac rate controlled  Respiratory: Effort normal and breath sounds normal.  GI: Bowel sounds are normal. She exhibits no distension.  Musculoskeletal: LE edema and tenderness (?improving) Neurological: She is alert.  Made good eye contact with examiner.   Fair awareness of deficits.   Follow commands.   Motor: RUE/RLE: 4-/5 proximal to distal LUE/LLE: 4-/5 proximal to distal (stronger than right, unchanged)  Skin: Skin is warm and dry.  Psychiatric: She has a normal mood and affect. Her behavior is  normal.   Assessment/Plan: 1. Functional deficits secondary to left thalamic left occipital infarcts as well as intracranial stenosis as well as history of CVA 2017 which require 3+ hours per day of interdisciplinary therapy in a comprehensive inpatient rehab setting. Physiatrist is providing close team supervision and 24 hour management of active medical problems listed below. Physiatrist and rehab team continue to assess barriers to discharge/monitor patient progress toward functional and medical goals.  Function:  Bathing Bathing position   Position: Wheelchair/chair at sink  Bathing parts Body parts bathed by patient: Right arm, Left arm, Chest, Abdomen, Right upper leg, Left upper leg, Right lower leg, Left lower leg Body parts bathed by helper: Back, Front perineal area, Buttocks  Bathing assist        Upper Body Dressing/Undressing Upper body dressing   What is the patient wearing?: Bra, Pull over shirt/dress Bra - Perfomed by patient: Thread/unthread right bra strap, Thread/unthread left bra strap Bra - Perfomed by helper: Hook/unhook bra (pull down sports bra) Pull over shirt/dress - Perfomed by patient: Thread/unthread right sleeve, Thread/unthread left sleeve Pull over shirt/dress - Perfomed by helper: Pull shirt over trunk, Put head through opening        Upper body assist Assist Level: Touching or steadying assistance(Pt > 75%)  Lower Body Dressing/Undressing Lower body dressing   What is the patient wearing?: Pants, Socks, Shoes   Underwear - Performed by helper: Thread/unthread right underwear leg, Thread/unthread left underwear leg, Pull underwear up/down   Pants- Performed by helper: Thread/unthread right pants leg, Thread/unthread left pants leg, Pull pants up/down   Non-skid slipper socks- Performed by helper: Don/doff right sock, Don/doff left sock   Socks - Performed by helper: Don/doff right sock, Don/doff left sock   Shoes - Performed by helper:  Don/doff right shoe, Don/doff left shoe          Lower body assist Assist for lower body dressing: (totalA)      Toileting Toileting          Toileting assist     Transfers Chair/bed transfer   Chair/bed transfer method: Stand pivot Chair/bed transfer assist level: Moderate assist (Pt 50 - 74%/lift or lower) Chair/bed transfer assistive device: Armrests     Locomotion Ambulation     Max distance: 25 Assist level: Touching or steadying assistance (Pt > 75%)   Wheelchair Wheelchair activity did not occur: Safety/medical concerns        Cognition Comprehension Comprehension assist level: Follows basic conversation/direction with extra time/assistive device  Expression Expression assist level: Expresses basic 75 - 89% of the time/requires cueing 10 - 24% of the time. Needs helper to occlude trach/needs to repeat words.  Social Interaction Social Interaction assist level: Interacts appropriately 75 - 89% of the time - Needs redirection for appropriate language or to initiate interaction.  Problem Solving Problem solving assist level: Solves basic 75 - 89% of the time/requires cueing 10 - 24% of the time  Memory Memory assist level: Recognizes or recalls 75 - 89% of the time/requires cueing 10 - 24% of the time    Medical Problem List and Plan: 1.  Gait disorder with dysphagia secondary to left thalamic left occipital infarcts as well as intracranial stenosis on 4/13 as well as history of CVA 2017  Cont CIR 2.  DVT Prophylaxis/Anticoagulation: SCDs.     Vascular study neg for DVT, however, suggestive of Baker's cyst, with edema and femoral artery calcification 3. Pain Management: Tylenol as needed 4. Mood: Provide emotional support 5. Neuropsych: This patient is ?fully capable of making decisions on her own behalf. 6. Skin/Wound Care: Routine skin checks 7. Fluids/Electrolytes/Nutrition: Routine I/O's    BMP within acceptable range on 4/18 8.  Dysphagia.  Dysphasia #3  nectar liquids.  Follow-up speech therapy   Advance diet as tolerated 9.  Diastolic congestive heart failure.  Monitor for any signs of fluid overload Filed Weights   02/26/18 1846  Weight: 76.1 kg (167 lb 12.3 oz)   10.  Sick sinus syndrome status post pacemaker.  Follow-up per cardiology services 11.  Diabetes mellitus peripheral neuropathy.  Hemoglobin A1c 6.9.  SSI.  Check blood sugars before meals and at bedtime.    Patient on Amaryl 2 mg daily prior to admission.  Resume as needed  Labile, monitor for trend 12.  Hypertension.  Toprol-XL 100 mg daily, Norvasc 5 mg twice daily.    Remains elevated, but improving 13.  Hyperlipidemia.  Zetia 14. Hypoalbuminemia  Supplement initiated on 4/15  15. PAD   Will speak with Vascular regarding results of dopplers  LOS (Days) 2 A FACE TO FACE EVALUATION WAS PERFORMED  Ankit Karis Juba 02/28/2018 12:37 PM

## 2018-02-28 NOTE — Progress Notes (Signed)
Occupational Therapy Session Note  Patient Details  Name: Charlene Greene MRN: [id number] Date of Birth: 08-05-1922  Today's Date: 02/28/2018 OT Individual Time: 1003-1100 OT Individual Time Calculation (min): 57 min    Short Term Goals: Week 1:  OT Short Term Goal 1 (Week 1): Pt will donn pullover shirt with supervision sitting unsupported.  OT Short Term Goal 2 (Week 1): Pt will complete LB bathing sit to stand with min assist.  OT Short Term Goal 3 (Week 1): Pt will perform LB dressing sit to stand with min assist.  OT Short Term Goal 4 (Week 1): Pt will perform toilet transfer to the 3:1 with RW and supervision.    Skilled Therapeutic Interventions/Progress Updates:    Pt completed transfer from the wheelchair to the walk-in shower tub bench with mod assist to start session.  Mod assist overall for removal of clothing prior to shower.  She was able to complete UB bathing with min assist and LB bathing with mod assist sit to stand.  Mod assist for stand pivot out to the wheelchair with therapist then transporting her to the sink for dressing tasks.  She needed mod demonstrational cueing to sequence dressing in order to recall the need to start with the LUE or LLE first, since they are slightly weaker.  Mod assist for sit to stand with max assist for pulling items over her hips once standing.  She was able to cross her LEs over the opposite knee with min assist as well with min assist for donning socks and slip on shoes.  Pt left in the wheelchair at end of session with call button and phone in reach and chair alarm and safety belt In place.     Therapy Documentation Precautions:  Precautions Precautions: Fall Precaution Comments: L hemiparesis Restrictions Weight Bearing Restrictions: No  Pain: Pain Assessment Pain Scale: Faces Pain Score: 0-No pain Faces Pain Scale: Hurts a little bit Pain Type: Chronic pain Pain Location: Leg Pain Orientation: Right;Left Pain Descriptors /  Indicators: Discomfort Pain Onset: On-going Pain Intervention(s): Medication (See eMAR);Ambulation/increased activity Multiple Pain Sites: No ADL: See Function Navigator for Current Functional Status.   Therapy/Group: Individual Therapy  Deondra Wigger OTR/L 02/28/2018, 12:44 PM

## 2018-02-28 NOTE — Progress Notes (Signed)
Speech Language Pathology Daily Session Note  Patient Details  Name: Charlene Greene MRN: [id number] Date of Birth: [birth year]  Today's Date: 02/28/2018 SLP Individual Time: 4098-11911304-1330 SLP Individual Time Calculation (min): 26 min  Short Term Goals: Week 1: SLP Short Term Goal 1 (Week 1): Pt will consume therapeutic trials of thin liquids with supervision cues for use of swallowing precautions and minimal overt s/s of aspiration over three consecutive sessions prior to advancement.  SLP Short Term Goal 2 (Week 1): Pt will consume therapeutic trials of mixed solids and thin liquid consistencies with supervision cues for use of swallowing precautions and minimal overt s/s of aspiration over three consecutive sessions prior to advancement.   SLP Short Term Goal 3 (Week 1): Pt will selectively attend to tasks in a mildly distracting environment for 30 minutes with supervision verbal cues for redirection to task.  SLP Short Term Goal 4 (Week 1): Pt will recall daily information with supervision verbal cues for use of external aids.   SLP Short Term Goal 5 (Week 1): Pt will complete basic, tasks with supervision cues for functional problem solving.    Skilled Therapeutic Interventions:  Pt was seen for skilled ST targeting dysphagia goals.  SLP facilitated the session with trials of thin liquids during lunch meal to continue working towards diet progression.  Pt demonstrated delayed coughing in 1 out of 6 trials.  No overt s/s of aspiration were evident with solids.  For now, recommend that pt remain on dys 3 textures and nectar thick liquids with trials of water in between meal following oral care per the water protocol.  Pt was left in wheelchair with call bell within reach, quick release belt donned, and chair alarm set.    Function:  Eating Eating   Modified Consistency Diet: Yes Eating Assist Level: More than reasonable amount of time;Supervision or verbal cues   Eating Set Up Assist For:  Opening containers       Cognition Comprehension Comprehension assist level: Follows basic conversation/direction with extra time/assistive device  Expression   Expression assist level: Expresses basic 75 - 89% of the time/requires cueing 10 - 24% of the time. Needs helper to occlude trach/needs to repeat words.  Social Interaction Social Interaction assist level: Interacts appropriately 90% of the time - Needs monitoring or encouragement for participation or interaction.  Problem Solving Problem solving assist level: Solves basic 75 - 89% of the time/requires cueing 10 - 24% of the time  Memory Memory assist level: Recognizes or recalls 75 - 89% of the time/requires cueing 10 - 24% of the time    Pain Pain Assessment Pain Scale: 0-10 Pain Score: 0-No pain   Therapy/Group: Individual Therapy  Mallori Araque, Melanee SpryNicole L 02/28/2018, 3:36 PM

## 2018-02-28 NOTE — Progress Notes (Signed)
Occupational Therapy Session Note  Patient Details  Name: Charlene Greene MRN: [id number] Date of Birth: [birth year]  Today's Date: 02/28/2018 OT Individual Time: [id number] OT Individual Time Calculation (min): 54 min    Short Term Goals: Week 1:  OT Short Term Goal 1 (Week 1): Pt will donn pullover shirt with supervision sitting unsupported.  OT Short Term Goal 2 (Week 1): Pt will complete LB bathing sit to stand with min assist.  OT Short Term Goal 3 (Week 1): Pt will perform LB dressing sit to stand with min assist.  OT Short Term Goal 4 (Week 1): Pt will perform toilet transfer to the 3:1 with RW and supervision.    Skilled Therapeutic Interventions/Progress Updates:    Pt's son and daughter-in-law present for session.  Discussed pt's level of dependence at ALF prior to admission.  They reported that pt used a lift chair most of the time and slept in a hospital bed.  She needed assistance with all ADLs during the day as well as toileting as she could not pull up her pants and refuses to wear dresses.  At night she was able to get up from the bed and go to the bathroom by herself, secondary to only wearing a night gown.  They report that she can ask for assistance at any time with use of call system at the facility.  Once discussion was finished, therapist took pt to the therapy gym where she transferred to a therapy mat with min assist.  Worked on sit to stand transitions and standing balance while engaged in peg activity.  Pt demonstrates decreased timing of hip and knee extension with sit to stand and needs mod facilitation to bring her hips forward over her knees simultaneously with hip extension.  She was able to maintain standing and perform reaching down and to the left and right with min assist for intervals of 2-3 mins before needing rest.  Pt stated during therapy that she needed to use the restroom but unfortunately was not able to make it back in time.  Finished session with transfer to  the toilet and pt needing max assist for clean of BM and donning of new brief.  Pt left with family in room and up in wheelchair.  Notified nursing of pt's desire to transfer back to the bed.    Therapy Documentation Precautions:  Precautions Precautions: Fall Precaution Comments: L hemiparesis Restrictions Weight Bearing Restrictions: No  Pain: Pain Assessment Pain Scale: 0-10 Pain Score: 0-No pain Faces Pain Scale: Hurts a little bit Pain Type: Acute pain Pain Location: Leg Pain Orientation: Right;Left Pain Descriptors / Indicators: Discomfort Pain Onset: With Activity Pain Intervention(s): Repositioned Multiple Pain Sites: No ADL: See Function Navigator for Current Functional Status.   Therapy/Group: Individual Therapy  Chenita Ruda OTR/L 02/28/2018, 4:06 PM

## 2018-03-01 ENCOUNTER — Inpatient Hospital Stay (HOSPITAL_COMMUNITY): Payer: Medicare Other | Admitting: Speech Pathology

## 2018-03-01 ENCOUNTER — Inpatient Hospital Stay (HOSPITAL_COMMUNITY): Payer: Medicare Other | Admitting: Physical Therapy

## 2018-03-01 ENCOUNTER — Inpatient Hospital Stay (HOSPITAL_COMMUNITY): Payer: Medicare Other | Admitting: Occupational Therapy

## 2018-03-01 LAB — GLUCOSE, CAPILLARY
GLUCOSE-CAPILLARY: 147 mg/dL — AB (ref 65–99)
GLUCOSE-CAPILLARY: 143 mg/dL — AB (ref 65–99)
GLUCOSE-CAPILLARY: 139 mg/dL — AB (ref 65–99)
GLUCOSE-CAPILLARY: 179 mg/dL — AB (ref 65–99)

## 2018-03-01 MED ORDER — GLIMEPIRIDE 1 MG PO TABS
1.0000 mg | ORAL_TABLET | Freq: Every day | ORAL | Status: DC
  Administered 2018-03-01 – 2018-03-13 (×13): 1 mg via ORAL
  Filled 2018-03-01 (×13): qty 1

## 2018-03-01 NOTE — Progress Notes (Signed)
[place] [facility]IC[facility] & [facility]ILITATION     PROGRESS NOTE  Subjective/Complaints:   Pain in entire legs bilaterally  ROS: Denies CP, SOB, nausea, vomiting, diarrhea.  Objective: Vital Signs: Blood pressure (!) 177/71, pulse 61, temperature 97.8 F (36.6 C), temperature source Oral, resp. rate 16, height 5\' 5"  (1.651 m), weight 76.1 kg (167 lb 12.3 oz), SpO2 93 %. No results found. Recent Labs    02/27/18 0618  WBC 6.3  HGB 13.5  HCT 42.8  PLT 223   Recent Labs    02/27/18 0618  NA 142  K 4.0  CL 105  GLUCOSE 156*  BUN 21*  CREATININE 0.95  CALCIUM 9.1   CBG (last 3)  Recent Labs    02/28/18 1651 02/28/18 2120 03/01/18 0633  GLUCAP 165* 230* 147*    Wt Readings from Last 3 Encounters:  02/26/18 76.1 kg (167 lb 12.3 oz)  02/22/18 75.9 kg (167 lb 5.3 oz)  09/26/16 82.4 kg (181 lb 9.6 oz)    Physical Exam:  BP (!) 177/71 (BP Location: Left Arm)   Pulse 61   Temp 97.8 F (36.6 C) (Oral)   Resp 16   Ht 5\' 5"  (1.651 m)   Wt 76.1 kg (167 lb 12.3 oz)   SpO2 93%   BMI 27.92 kg/m  Constitutional: She appears well-developed and well-nourished.  HENT: Normocephalic and atraumatic.  Eyes: EOM are normal. No discharge.  Cardiovascular: Cardiac rate controlled  Respiratory: Effort normal and breath sounds normal.  GI: Bowel sounds are normal. She exhibits no distension.  Musculoskeletal: LE edema and tenderness (?improving) Neurological: She is alert.  Made good eye contact with examiner.   Fair awareness of deficits.   Follow commands.   Motor: RUE/RLE: 4-/5 proximal to distal LUE/LLE: 4-/5 proximal to distal (stronger than right, unchanged)  Skin: Skin is warm and dry.  Psychiatric: She has a normal mood and affect. Her behavior is normal.   Assessment/Plan: 1. Functional deficits secondary to left thalamic left occipital infarcts as well as intracranial stenosis as well as history of CVA 2017 which require 3+ hours per day of interdisciplinary  therapy in a comprehensive inpatient rehab setting. Physiatrist is providing close team supervision and 24 hour management of active medical problems listed below. Physiatrist and rehab team continue to assess barriers to discharge/monitor patient progress toward functional and medical goals.  Function:  Bathing Bathing position   Position: Shower  Bathing parts Body parts bathed by patient: Right arm, Left arm, Chest, Abdomen, Front perineal area, Right upper leg, Left upper leg, Left lower leg, Right lower leg Body parts bathed by helper: Back, Buttocks  Bathing assist Assist Level: Touching or steadying assistance(Pt > 75%)      Upper Body Dressing/Undressing Upper body dressing   What is the patient wearing?: Bra, Pull over shirt/dress Bra - Perfomed by patient: Thread/unthread right bra strap, Thread/unthread left bra strap Bra - Perfomed by helper: Thread/unthread right bra strap, Thread/unthread left bra strap, Hook/unhook bra (pull down sports bra) Pull over shirt/dress - Perfomed by patient: Thread/unthread right sleeve, Thread/unthread left sleeve, Put head through opening Pull over shirt/dress - Perfomed by helper: Pull shirt over trunk        Upper body assist Assist Level: Touching or steadying assistance(Pt > 75%)      Lower Body Dressing/Undressing Lower body dressing   What is the patient wearing?: Pants, Socks, Shoes, Underwear Underwear - Performed by patient: Thread/unthread right underwear leg Underwear - Performed by helper:  Thread/unthread left underwear leg, Pull underwear up/down Pants- Performed by patient: Thread/unthread right pants leg, Thread/unthread left pants leg Pants- Performed by helper: Pull pants up/down   Non-skid slipper socks- Performed by helper: Don/doff right sock, Don/doff left sock Socks - Performed by patient: Don/doff right sock, Don/doff left sock Socks - Performed by helper: Don/doff right sock, Don/doff left sock Shoes -  Performed by patient: Don/doff right shoe, Don/doff left shoe Shoes - Performed by helper: Don/doff right shoe, Don/doff left shoe          Lower body assist Assist for lower body dressing: (totalA)      Toileting Toileting     Toileting steps completed by helper: Adjust clothing prior to toileting, Performs perineal hygiene, Adjust clothing after toileting    Toileting assist     Transfers Chair/bed transfer   Chair/bed transfer method: Stand pivot Chair/bed transfer assist level: Moderate assist (Pt 50 - 74%/lift or lower) Chair/bed transfer assistive device: Armrests     Locomotion Ambulation     Max distance: 25 Assist level: Touching or steadying assistance (Pt > 75%)   Wheelchair Wheelchair activity did not occur: Safety/medical concerns        Cognition Comprehension Comprehension assist level: Follows basic conversation/direction with extra time/assistive device  Expression Expression assist level: Expresses basic 90% of the time/requires cueing < 10% of the time.  Social Interaction Social Interaction assist level: Interacts appropriately 90% of the time - Needs monitoring or encouragement for participation or interaction.  Problem Solving Problem solving assist level: Solves basic 75 - 89% of the time/requires cueing 10 - 24% of the time  Memory Memory assist level: Recognizes or recalls 75 - 89% of the time/requires cueing 10 - 24% of the time    Medical Problem List and Plan: 1.  Gait disorder with dysphagia secondary to left thalamic left occipital infarcts as well as intracranial stenosis on 4/13 as well as history of CVA 2017  Cont CIR PT, OT, SLP 2.  DVT Prophylaxis/Anticoagulation: SCDs.     Vascular study neg for DVT, however, suggestive of Baker's cyst, with edema and femoral artery calcification 3. Pain Management: Tylenol as needed 4. Mood: Provide emotional support 5. Neuropsych: This patient is ?fully capable of making decisions on her own  behalf. 6. Skin/Wound Care: Routine skin checks 7. Fluids/Electrolytes/Nutrition: Routine I/O's    BMP within acceptable range on 4/18 8.  Dysphagia.  Dysphasia #3 nectar liquids.  Follow-up speech therapy   Advance diet as tolerated 9.  Diastolic congestive heart failure.  Monitor for any signs of fluid overload Filed Weights   02/26/18 1846  Weight: 76.1 kg (167 lb 12.3 oz)   10.  Sick sinus syndrome status post pacemaker.  Follow-up per cardiology services 11.  Diabetes mellitus peripheral neuropathy.  Hemoglobin A1c 6.9.  SSI.  Check blood sugars before meals and at bedtime.    Patient on Amaryl 2 mg daily prior to admission.  Resume as needed   CBG (last 3)  Recent Labs    02/28/18 1651 02/28/18 2120 03/01/18 0633  GLUCAP 165* 230* 147*  am CBG in range evening elevated restart low dose amaryl 12.  Hypertension.  Toprol-XL 100 mg daily, Norvasc 5 mg twice daily.     Vitals:   02/28/18 1503 03/01/18 0253  BP: 136/69 (!) 177/71  Pulse: 63 61  Resp: 20 16  Temp: 98 F (36.7 C) 97.8 F (36.6 C)  SpO2: 97% 93%   13.  Hyperlipidemia.  Zetia  14. Hypoalbuminemia  Supplement initiated on 4/15  15. PAD -has fairly consistent pain in BLE with partial relief using tylenol, fem art calcification likely chronic , we discussed further testing with arterial dopplers but pt declines furhter tests    LOS (Days) 3 A FACE TO FACE EVALUATION WAS PERFORMED  Erick Colacendrew E Juma Oxley 03/01/2018 7:33 AM

## 2018-03-01 NOTE — Progress Notes (Addendum)
Physical Therapy Session Note  Patient Details  Name: Charlene Greene MRN: [id number] Date of Birth: January 31, 1922  Today's Date: 03/01/2018 PT Individual Time: 1340-1435 PT Individual Time Calculation (min): 55 min   Short Term Goals: Week 1:  PT Short Term Goal 1 (Week 1): Pt will perform bed mobility minA from hospital bed PT Short Term Goal 2 (Week 1): Pt will perform stand pivot transfer minA PT Short Term Goal 3 (Week 1): Pt will ambulate x25' minA  Skilled Therapeutic Interventions/Progress Updates:   Pt in w/c and agreeable to therapy, denies pain. Requesting to toilet. Worked on endurance and independence functional mobility while ambulating to/from toilet w/ min guard and increased time 2/2 slow gait speed. Max assist to boost into standing and total assist for LE garment management. Total assist w/c transport to/from therapy gym for time management. Worked on UE strengthening, performing beach ball toss w/ 1# dowel rod, 1# dowel shoulder press 3x5, bicep curls 1x5, and shoulder flexion within available range 3x5. Performed oral care as per water protocol, and provided w/ small sips of water. Pt would take a few sips and have multiple coughs. Pt reports this is her baseline, however encouraged to completely clear throat before taking another sip. Ended session in recliner, call bell within reach and all needs met. Quick release belt and chair alarm engaged.   Therapy Documentation Precautions:  Precautions Precautions: Fall Precaution Comments: L hemiparesis Restrictions Weight Bearing Restrictions: No  See Function Navigator for Current Functional Status.   Therapy/Group: Individual Therapy  Drevon Plog K Arnette 03/01/2018, 2:36 PM

## 2018-03-01 NOTE — Progress Notes (Signed)
Occupational Therapy Session Note  Patient Details  Name: Charlene Greene MRN: [id number] Date of Birth: 1921/12/08  Today's Date: 03/01/2018 OT Individual Time: 3557-3220 and 2542-7062 OT Individual Time Calculation (min): 60 min and 40 min   Short Term Goals: Week 1:  OT Short Term Goal 1 (Week 1): Pt will donn pullover shirt with supervision sitting unsupported.  OT Short Term Goal 2 (Week 1): Pt will complete LB bathing sit to stand with min assist.  OT Short Term Goal 3 (Week 1): Pt will perform LB dressing sit to stand with min assist.  OT Short Term Goal 4 (Week 1): Pt will perform toilet transfer to the 3:1 with RW and supervision.    Skilled Therapeutic Interventions/Progress Updates:    Visit 1: c/o B leg pain "achy"   Pt seen for BADL retraining of toileting, bathing, and dressing with a focus on sit >< stand skills and transfers. Pt sat to EOB with min-mod A and then from elevated bed she stood up with only min A.  Pt did need to toilet so used W/c to get into bathroom and then pt worked on stand pivot with mod A to stand and min to turn her body both L and R to elevated seat over wc. Pt cleansed self with therapist cleansing again for thoroughness.   Pt requested sponge bath vs. Shower.  She sat at sink for UB self care.  For LB, pt worked on sit to stand with increasing amounts of A as she was becoming more fatigued with each stand.  She wants to use one hand to pull up on vs 2 hands to push up with and this makes it more challenging for her so will need to keep focusing on technique.   Pt did brush teeth with supervision, but when she rinsed with the water she swallowed some and coughed for a minute.  Pt still coughing slightly and alerted the nurse but did not initiate the water protocol for this session. Pt resting in w/c with chair alarm and quick release belt on.    Visit 2: no c/o pain this session Pt seen this session for facilitation of sit to stand, standing balance, and  LUE coordination. Pt received in w/c and taken to gym. Pt worked on sit to stand to parallel bars with mod A.  Due to L hand weakness, she had difficulty pushing up from w/c and maintaining her grasp on arm rest.  Pt would laugh for several minutes each time this happened and it would take some time to get her redirected.  Once standing she worked on upright posture and weight shifts.   Pt also worked on L hand grasp and reach with placing resistive clothespins. She has limited L shoulder flex PROM to 80 degrees due to OA.  Pt tolerated session well. Pt taken back to room with belt and alarm on and all needs met.    Therapy Documentation Precautions:  Precautions Precautions: Fall Precaution Comments: L hemiparesis Restrictions Weight Bearing Restrictions: No     ADL:   See Function Navigator for Current Functional Status.   Therapy/Group: Individual Therapy  , 03/01/2018, 7:50 AM

## 2018-03-01 NOTE — Progress Notes (Signed)
Speech Language Pathology Daily Session Note  Patient Details  Name: Charlene Greene MRN: [id number] Date of Birth: [birth year]  Today's Date: 03/01/2018 SLP Individual Time: 1100-1130 SLP Individual Time Calculation (min): 30 min  Short Term Goals: Week 1: SLP Short Term Goal 1 (Week 1): Pt will consume therapeutic trials of thin liquids with supervision cues for use of swallowing precautions and minimal overt s/s of aspiration over three consecutive sessions prior to advancement.  SLP Short Term Goal 2 (Week 1): Pt will consume therapeutic trials of mixed solids and thin liquid consistencies with supervision cues for use of swallowing precautions and minimal overt s/s of aspiration over three consecutive sessions prior to advancement.   SLP Short Term Goal 3 (Week 1): Pt will selectively attend to tasks in a mildly distracting environment for 30 minutes with supervision verbal cues for redirection to task.  SLP Short Term Goal 4 (Week 1): Pt will recall daily information with supervision verbal cues for use of external aids.   SLP Short Term Goal 5 (Week 1): Pt will complete basic, tasks with supervision cues for functional problem solving.    Skilled Therapeutic Interventions: Skilled treatment session focused on cognitive goals. Upon arrival, patient was very lethargic. Due to fatigue, patient requested a cognitive task and declined trials of thin. SLP facilitated session by providing intermittent Min A verbal cues for problem solving during a mildly complex, new learning and novel card task. Patient left upright in wheelchair with quick release belt in place. Continue with current plan of care.      Function:  Cognition Comprehension Comprehension assist level: Follows basic conversation/direction with extra time/assistive device  Expression   Expression assist level: Expresses basic 90% of the time/requires cueing < 10% of the time.  Social Interaction Social Interaction assist level:  Interacts appropriately 90% of the time - Needs monitoring or encouragement for participation or interaction.  Problem Solving Problem solving assist level: Solves basic 75 - 89% of the time/requires cueing 10 - 24% of the time  Memory Memory assist level: Recognizes or recalls 75 - 89% of the time/requires cueing 10 - 24% of the time    Pain No/Denies Pain   Therapy/Group: Individual Therapy  Marty Sadlowski 03/01/2018, 12:45 PM

## 2018-03-02 ENCOUNTER — Inpatient Hospital Stay (HOSPITAL_COMMUNITY): Payer: Medicare Other

## 2018-03-02 LAB — GLUCOSE, CAPILLARY
GLUCOSE-CAPILLARY: 152 mg/dL — AB (ref 65–99)
Glucose-Capillary: 157 mg/dL — ABNORMAL HIGH (ref 65–99)
Glucose-Capillary: 164 mg/dL — ABNORMAL HIGH (ref 65–99)
Glucose-Capillary: 209 mg/dL — ABNORMAL HIGH (ref 65–99)

## 2018-03-02 MED ORDER — INSULIN ASPART 100 UNIT/ML ~~LOC~~ SOLN
0.0000 [IU] | Freq: Three times a day (TID) | SUBCUTANEOUS | Status: DC
  Administered 2018-03-02: 2 [IU] via SUBCUTANEOUS
  Administered 2018-03-03 – 2018-03-04 (×3): 1 [IU] via SUBCUTANEOUS
  Administered 2018-03-05: 2 [IU] via SUBCUTANEOUS
  Administered 2018-03-05: 1 [IU] via SUBCUTANEOUS
  Administered 2018-03-06: 2 [IU] via SUBCUTANEOUS
  Administered 2018-03-08 – 2018-03-11 (×6): 1 [IU] via SUBCUTANEOUS

## 2018-03-02 MED ORDER — INSULIN ASPART 100 UNIT/ML ~~LOC~~ SOLN
0.0000 [IU] | Freq: Every day | SUBCUTANEOUS | Status: DC
  Administered 2018-03-02: 2 [IU] via SUBCUTANEOUS

## 2018-03-02 NOTE — Progress Notes (Signed)
Physical Therapy Session Note  Patient Details  Name: Charlene Greene MRN: [id number] Date of Birth: [birth year]  Today's Date: 03/02/2018 PT Individual Time: 7829-56210945-1029 PT Individual Time Calculation (min): 44 min   Short Term Goals: Week 1:  PT Short Term Goal 1 (Week 1): Pt will perform bed mobility minA from hospital bed PT Short Term Goal 2 (Week 1): Pt will perform stand pivot transfer minA PT Short Term Goal 3 (Week 1): Pt will ambulate x25' minA  Skilled Therapeutic Interventions/Progress Updates:    Pt seated in w/c upon PT arrival, agreeable to therapy tx and reports pain in B LEs 5/10. Pt transported from room>gym. Pt ambulated x 20 ft with RW and min assist, increased time and verbal cues for upright posture and step length. Pt performed x 5 sit<>stands blocked practice from elevated mat, no UE support in standing, manual facilitation for increased hip extension in standing, min-mod assist. Pt worked on standing balance without UE support, emphasis on hip/trunk extension while reaching overhead to perform card matching activity, min assist. Pt ambulated x 20 ft from mat back to w/c with RW and min assist, transported back to room and left seated with QRB in place and chair alarm set.   Therapy Documentation Precautions:  Precautions Precautions: Fall Precaution Comments: L hemiparesis Restrictions Weight Bearing Restrictions: No   See Function Navigator for Current Functional Status.   Therapy/Group: Individual Therapy  Cresenciano GenreEmily van Schagen, PT, DPT 03/02/2018, 7:49 AM

## 2018-03-02 NOTE — Progress Notes (Addendum)
[place] PHYSICAL MEDICINE & REHABILITATION     PROGRESS NOTE  Subjective/Complaints:   No increase in leg pain, had right heel pain yesterday  ROS: Denies CP, SOB, nausea, vomiting, diarrhea.  Objective: Vital Signs: Blood pressure (!) 199/74, pulse 62, temperature 98 F (36.7 C), temperature source Oral, resp. rate 18, height 5\' 5"  (1.651 m), weight 76.1 kg (167 lb 12.3 oz), SpO2 94 %. No results found. No results for input(s): WBC, HGB, HCT, PLT in the last 72 hours. No results for input(s): NA, K, CL, GLUCOSE, BUN, CREATININE, CALCIUM in the last 72 hours.  Invalid input(s): CO CBG (last 3)  Recent Labs    03/01/18 1634 03/01/18 2136 03/02/18 0646  GLUCAP 139* 179* 157*    Wt Readings from Last 3 Encounters:  02/26/18 76.1 kg (167 lb 12.3 oz)  02/22/18 75.9 kg (167 lb 5.3 oz)  09/26/16 82.4 kg (181 lb 9.6 oz)    Physical Exam:  BP (!) 199/74 (BP Location: Left Arm)   Pulse 62   Temp 98 F (36.7 C) (Oral)   Resp 18   Ht 5\' 5"  (1.651 m)   Wt 76.1 kg (167 lb 12.3 oz)   SpO2 94%   BMI 27.92 kg/m  Constitutional: She appears well-developed and well-nourished.  HENT: Normocephalic and atraumatic.  Eyes: EOM are normal. No discharge.  Cardiovascular: Cardiac rate controlled  Respiratory: Effort normal and breath sounds normal.  GI: Bowel sounds are normal. She exhibits no distension.  Musculoskeletal: LE edema and tenderness (?improving) Neurological: She is alert.  Made good eye contact with examiner.   Fair awareness of deficits.   Follow commands.   Motor: RUE/RLE: 4-/5 proximal to distal LUE/LLE: 4-/5 proximal to distal (stronger than right, unchanged)  Skin: Skin is warm and dry, toes pink but cool, no open area , .5cm scap on R achilles no erythema.  Psychiatric: She has a normal mood and affect. Her behavior is normal.   Assessment/Plan: 1. Functional deficits secondary to left thalamic left occipital infarcts as well as intracranial stenosis as well  as history of CVA 2017 which require 3+ hours per day of interdisciplinary therapy in a comprehensive inpatient rehab setting. Physiatrist is providing close team supervision and 24 hour management of active medical problems listed below. Physiatrist and rehab team continue to assess barriers to discharge/monitor patient progress toward functional and medical goals.  Function:  Bathing Bathing position   Position: Wheelchair/chair at sink  Bathing parts Body parts bathed by patient: Right arm, Left arm, Chest, Abdomen, Front perineal area, Right upper leg, Left upper leg, Left lower leg, Right lower leg Body parts bathed by helper: Buttocks, Back  Bathing assist Assist Level: Touching or steadying assistance(Pt > 75%)      Upper Body Dressing/Undressing Upper body dressing   What is the patient wearing?: Bra, Pull over shirt/dress Bra - Perfomed by patient: Thread/unthread right bra strap, Thread/unthread left bra strap Bra - Perfomed by helper: Hook/unhook bra (pull down sports bra) Pull over shirt/dress - Perfomed by patient: Thread/unthread right sleeve, Thread/unthread left sleeve, Pull shirt over trunk Pull over shirt/dress - Perfomed by helper: Put head through opening        Upper body assist Assist Level: Touching or steadying assistance(Pt > 75%)      Lower Body Dressing/Undressing Lower body dressing   What is the patient wearing?: Pants, Socks, Shoes, Underwear Underwear - Performed by patient: Thread/unthread right underwear leg Underwear - Performed by helper: Thread/unthread left underwear leg,  Pull underwear up/down, Thread/unthread right underwear leg Pants- Performed by patient: Thread/unthread right pants leg, Thread/unthread left pants leg Pants- Performed by helper: Pull pants up/down   Non-skid slipper socks- Performed by helper: Don/doff right sock, Don/doff left sock Socks - Performed by patient: Don/doff right sock, Don/doff left sock Socks - Performed by  helper: Don/doff right sock, Don/doff left sock Shoes - Performed by patient: Don/doff right shoe, Don/doff left shoe Shoes - Performed by helper: Don/doff right shoe, Don/doff left shoe          Lower body assist Assist for lower body dressing: (totalA)      Toileting Toileting     Toileting steps completed by helper: Adjust clothing prior to toileting, Performs perineal hygiene, Adjust clothing after toileting Toileting Assistive Devices: Grab bar or rail  Toileting assist Assist level: Two helpers   Transfers Chair/bed transfer   Chair/bed transfer method: Stand pivot Chair/bed transfer assist level: Maximal assist (Pt 25 - 49%/lift and lower) Chair/bed transfer assistive device: Armrests, Patent attorneyWalker     Locomotion Ambulation     Max distance: 20' Assist level: Touching or steadying assistance (Pt > 75%)   Wheelchair Wheelchair activity did not occur: Safety/medical concerns        Cognition Comprehension Comprehension assist level: Follows basic conversation/direction with no assist  Expression Expression assist level: Expresses basic 90% of the time/requires cueing < 10% of the time.  Social Interaction Social Interaction assist level: Interacts appropriately 90% of the time - Needs monitoring or encouragement for participation or interaction.  Problem Solving Problem solving assist level: Solves basic 75 - 89% of the time/requires cueing 10 - 24% of the time  Memory Memory assist level: Recognizes or recalls 50 - 74% of the time/requires cueing 25 - 49% of the time    Medical Problem List and Plan: 1.  Gait disorder with dysphagia secondary to left thalamic left occipital infarcts as well as intracranial stenosis on 4/13 as well as history of CVA 2017  Cont CIR PT, OT, SLP 2.  DVT Prophylaxis/Anticoagulation: SCDs.     Vascular study neg for DVT, however, suggestive of Baker's cyst, with edema and femoral artery calcification 3. Pain Management: Tylenol as needed 4.  Mood: Provide emotional support 5. Neuropsych: This patient is ?fully capable of making decisions on her own behalf. 6. Skin/Wound Care: Routine skin checks 7. Fluids/Electrolytes/Nutrition: Routine I/O's    BMP within acceptable range on 4/18 8.  Dysphagia.  Dysphasia #3 nectar liquids.  Follow-up speech therapy   Advance diet as tolerated 9.  Diastolic congestive heart failure.  Monitor for any signs of fluid overload Filed Weights   02/26/18 1846  Weight: 76.1 kg (167 lb 12.3 oz)   10.  Sick sinus syndrome status post pacemaker.  Follow-up per cardiology services 11.  Diabetes mellitus peripheral neuropathy.  Hemoglobin A1c 6.9.  SSI.  Check blood sugars before meals and at bedtime.    Patient on Amaryl 2 mg daily prior to admission.  Resume as needed   CBG (last 3)  Recent Labs    03/01/18 1634 03/01/18 2136 03/02/18 0646  GLUCAP 139* 179* 157*  am CBG in range evening elevated restart low dose amaryl 4/20,monitor 12.  Hypertension.  Toprol-XL 100 mg daily, Norvasc 5 mg twice daily.     Vitals:   03/01/18 2100 03/02/18 0340  BP: (!) 186/71 (!) 199/74  Pulse:  62  Resp:  18  Temp:  98 F (36.7 C)  SpO2:  94%  BP uncontrolled permissive HTN for now start ot regular more tightly next wk 13.  Hyperlipidemia.  Zetia 14. Hypoalbuminemia  Supplement initiated on 4/15  15. PAD -has fairly consistent pain in BLE with partial relief using tylenol, fem art calcification likely chronic , we discussed further testing with arterial dopplers but pt declines furhter tests    LOS (Days) 4 A FACE TO FACE EVALUATION WAS PERFORMED  Erick Colace 03/02/2018 7:11 AM

## 2018-03-03 ENCOUNTER — Inpatient Hospital Stay (HOSPITAL_COMMUNITY): Payer: Medicare Other

## 2018-03-03 ENCOUNTER — Inpatient Hospital Stay (HOSPITAL_COMMUNITY): Payer: Medicare Other | Admitting: Occupational Therapy

## 2018-03-03 ENCOUNTER — Inpatient Hospital Stay (HOSPITAL_COMMUNITY): Payer: Medicare Other | Admitting: Physical Therapy

## 2018-03-03 LAB — GLUCOSE, CAPILLARY
GLUCOSE-CAPILLARY: 117 mg/dL — AB (ref 65–99)
Glucose-Capillary: 111 mg/dL — ABNORMAL HIGH (ref 65–99)
Glucose-Capillary: 130 mg/dL — ABNORMAL HIGH (ref 65–99)
Glucose-Capillary: 153 mg/dL — ABNORMAL HIGH (ref 65–99)

## 2018-03-03 NOTE — Progress Notes (Signed)
[place] PHYSICAL MEDICINE & REHABILITATION     PROGRESS NOTE  Subjective/Complaints:   Pt without new issues, problem sleeping but rested after tylenol,  Pt unaware that she could request tylenol  ROS: Denies CP, SOB, nausea, vomiting, diarrhea.  Objective: Vital Signs: Blood pressure (!) 161/75, pulse 63, temperature 98.2 F (36.8 C), temperature source Oral, resp. rate 17, height 5\' 5"  (1.651 m), weight 76.1 kg (167 lb 12.3 oz), SpO2 93 %. No results found. No results for input(s): WBC, HGB, HCT, PLT in the last 72 hours. No results for input(s): NA, K, CL, GLUCOSE, BUN, CREATININE, CALCIUM in the last 72 hours.  Invalid input(s): CO CBG (last 3)  Recent Labs    03/02/18 1645 03/02/18 2109 03/03/18 0608  GLUCAP 152* 209* 117*    Wt Readings from Last 3 Encounters:  02/26/18 76.1 kg (167 lb 12.3 oz)  02/22/18 75.9 kg (167 lb 5.3 oz)  09/26/16 82.4 kg (181 lb 9.6 oz)    Physical Exam:  BP (!) 161/75 (BP Location: Left Arm)   Pulse 63   Temp 98.2 F (36.8 C) (Oral)   Resp 17   Ht 5\' 5"  (1.651 m)   Wt 76.1 kg (167 lb 12.3 oz)   SpO2 93%   BMI 27.92 kg/m  Constitutional: She appears well-developed and well-nourished.  HENT: Normocephalic and atraumatic.  Eyes: EOM are normal. No discharge.  Cardiovascular: Cardiac rate controlled  Respiratory: Effort normal and breath sounds normal.  GI: Bowel sounds are normal. She exhibits no distension.  Musculoskeletal: LE edema and tenderness (?improving) Neurological: She is alert.  Made good eye contact with examiner.   Fair awareness of deficits.   Follow commands.   Motor: RUE/RLE: 4-/5 proximal to distal LUE/LLE: 4-/5 proximal to distal (stronger than right, unchanged)  Skin: Skin is warm and dry, toes pink but cool, no open area , .5cm scap on R achilles no erythema.  Psychiatric: She has a normal mood and affect. Her behavior is normal.   Assessment/Plan: 1. Functional deficits secondary to left thalamic left  occipital infarcts as well as intracranial stenosis as well as history of CVA 2017 which require 3+ hours per day of interdisciplinary therapy in a comprehensive inpatient rehab setting. Physiatrist is providing close team supervision and 24 hour management of active medical problems listed below. Physiatrist and rehab team continue to assess barriers to discharge/monitor patient progress toward functional and medical goals.  Function:  Bathing Bathing position   Position: Wheelchair/chair at sink  Bathing parts Body parts bathed by patient: Right arm, Left arm, Chest, Abdomen, Front perineal area, Right upper leg, Left upper leg, Left lower leg, Right lower leg Body parts bathed by helper: Buttocks, Back  Bathing assist Assist Level: Touching or steadying assistance(Pt > 75%)      Upper Body Dressing/Undressing Upper body dressing   What is the patient wearing?: Bra, Pull over shirt/dress Bra - Perfomed by patient: Thread/unthread right bra strap, Thread/unthread left bra strap Bra - Perfomed by helper: Hook/unhook bra (pull down sports bra) Pull over shirt/dress - Perfomed by patient: Thread/unthread right sleeve, Thread/unthread left sleeve, Pull shirt over trunk Pull over shirt/dress - Perfomed by helper: Put head through opening        Upper body assist Assist Level: Touching or steadying assistance(Pt > 75%)      Lower Body Dressing/Undressing Lower body dressing   What is the patient wearing?: Pants, Socks, Shoes, Underwear Underwear - Performed by patient: Thread/unthread right underwear leg Underwear -  Performed by helper: Thread/unthread left underwear leg, Pull underwear up/down, Thread/unthread right underwear leg Pants- Performed by patient: Thread/unthread right pants leg, Thread/unthread left pants leg Pants- Performed by helper: Pull pants up/down   Non-skid slipper socks- Performed by helper: Don/doff right sock, Don/doff left sock Socks - Performed by patient:  Don/doff right sock, Don/doff left sock Socks - Performed by helper: Don/doff right sock, Don/doff left sock Shoes - Performed by patient: Don/doff right shoe, Don/doff left shoe Shoes - Performed by helper: Don/doff right shoe, Don/doff left shoe          Lower body assist Assist for lower body dressing: (totalA)      Toileting Toileting   Toileting steps completed by patient: Performs perineal hygiene Toileting steps completed by helper: Adjust clothing prior to toileting, Performs perineal hygiene, Adjust clothing after toileting Toileting Assistive Devices: Grab bar or rail  Toileting assist Assist level: Touching or steadying assistance (Pt.75%)   Transfers Chair/bed transfer   Chair/bed transfer method: Ambulatory Chair/bed transfer assist level: Touching or steadying assistance (Pt > 75%) Chair/bed transfer assistive device: Armrests, Patent attorneyWalker     Locomotion Ambulation     Max distance: 20' Assist level: Touching or steadying assistance (Pt > 75%)   Wheelchair Wheelchair activity did not occur: Safety/medical concerns        Cognition Comprehension Comprehension assist level: Follows basic conversation/direction with no assist  Expression Expression assist level: Expresses basic needs/ideas: With no assist  Social Interaction Social Interaction assist level: Interacts appropriately 90% of the time - Needs monitoring or encouragement for participation or interaction.  Problem Solving Problem solving assist level: Solves basic 75 - 89% of the time/requires cueing 10 - 24% of the time  Memory Memory assist level: Recognizes or recalls 75 - 89% of the time/requires cueing 10 - 24% of the time    Medical Problem List and Plan: 1.  Gait disorder with dysphagia secondary to left thalamic left occipital infarcts as well as intracranial stenosis on 4/13 as well as history of CVA 2017  Cont CIR PT, OT, SLP team conf in am 2.  DVT Prophylaxis/Anticoagulation: SCDs.      Vascular study neg for DVT, however, suggestive of Baker's cyst, with edema and femoral artery calcification 3. Pain Management: Tylenol as needed 4. Mood: Provide emotional support 5. Neuropsych: This patient is ?fully capable of making decisions on her own behalf. 6. Skin/Wound Care: Routine skin checks 7. Fluids/Electrolytes/Nutrition: Routine I/O's    BMP within acceptable range on 4/18 8.  Dysphagia.  Dysphasia #3 nectar liquids.  Follow-up speech therapy   Advance diet as tolerated 9.  Diastolic congestive heart failure.  Monitor for any signs of fluid overload Filed Weights   02/26/18 1846  Weight: 76.1 kg (167 lb 12.3 oz)   10.  Sick sinus syndrome status post pacemaker.  Follow-up per cardiology services 11.  Diabetes mellitus peripheral neuropathy.  Hemoglobin A1c 6.9.  SSI.  Check blood sugars before meals and at bedtime.    Patient on Amaryl 2 mg daily prior to admission.  Resume as needed   CBG (last 3)  Recent Labs    03/02/18 1645 03/02/18 2109 03/03/18 0608  GLUCAP 152* 209* 117*  am CBG in range evening elevated just started low dose amaryl 4/20,monitor 12.  Hypertension.  Toprol-XL 100 mg daily, Norvasc 5 mg twice daily.     Vitals:   03/02/18 1942 03/03/18 0117  BP: (!) 156/69 (!) 161/75  Pulse:  63  Resp:  17  Temp:  98.2 F (36.8 C)  SpO2:  93%  Systolic HTN, may start regulating more strictly this week 13.  Hyperlipidemia.  Zetia 14. Hypoalbuminemia  Supplement initiated on 4/15  15. PAD -has fairly consistent pain in BLE with partial relief using tylenol, fem art calcification likely chronic , we discussed further testing with arterial dopplers but pt declines furhter tests    LOS (Days) 5 A FACE TO FACE EVALUATION WAS PERFORMED  Erick Colace 03/03/2018 7:18 AM

## 2018-03-03 NOTE — Progress Notes (Signed)
Physical Therapy Session Note  Patient Details  Name: Charlene Greene MRN: [id number] Date of Birth: [birth year]  Today's Date: 03/03/2018 PT Individual Time: 1100-1200 PT Individual Time Calculation (min): 60 min   Short Term Goals: Week 1:  PT Short Term Goal 1 (Week 1): Pt will perform bed mobility minA from hospital bed PT Short Term Goal 2 (Week 1): Pt will perform stand pivot transfer minA PT Short Term Goal 3 (Week 1): Pt will ambulate x25' minA  Skilled Therapeutic Interventions/Progress Updates: Pt received seated in w/c, denies pain "other than the usual" in BLEs and agreeable to treatment. W/c propulsion x30' with minA for strengthening and endurance. Pt reports w/c at ALF is only a transport chair and she is occasionally able to propel with B feet. Sit >stand x4 trials before successful with cueing required to recall scooting hips to edge of seat, boosting into stand d/t poor anterior weight shift to move BOS over feet despite cueing for "nose over toes". Ambulates x20' with RW and min guard; one RLE buckle but recovers without additional assist from therapist. Sitting trunk flexion with stability ball push 2x10 reps to facilitate anterior weight shift for carryover into sit >stand; provided small red wedge under R hip to facilitate R trunk shortening/L trunk lengthening. Performed x5 reps sit>stand following activity, improved to S however mat table slightly higher than w/c. Standing gastroc stretch on red wedge 2x1 min. Unable to perform heel raise in standing; performed BLE resisted plantarflexion with level 1 theraband. Standing balance on airex foam pad BUE>one UE>no UE support to facilitate ankle strategy and functional ankle dorsiflexion/plantarflexion strength; pt with posterior bias and requires cues for hip extension, anterior weight shift of COM over feet and forward visual fixation to maintain upright posture. Gait to return to w/c min guard; note improved gait speed, increased step  length following ROM and strength/balance retraining activities. Provided towel wedge under R hip to continue facilitation of neutral pelvic alignment/trunk lengthening/shortening. Remained seated in w/c at end of session, quick release belt and chair alarm intact, all needs in reach.      Therapy Documentation Precautions:  Precautions Precautions: Fall Precaution Comments: L hemiparesis Restrictions Weight Bearing Restrictions: No Pain: Pain Assessment Pain Scale: 0-10 Pain Score: 0-No pain   See Function Navigator for Current Functional Status.   Therapy/Group: Individual Therapy  Vista Lawmanlizabeth J Tygielski 03/03/2018, 12:03 PM

## 2018-03-03 NOTE — Progress Notes (Signed)
Occupational Therapy Session Note  Patient Details  Name: Charlene Greene MRN: [id number] Date of Birth: 1922/05/10  Today's Date: 03/03/2018 OT Individual Time: 0930-1100 OT Individual Time Calculation (min): 90 min    Short Term Goals: Week 1:  OT Short Term Goal 1 (Week 1): Pt will donn pullover shirt with supervision sitting unsupported.  OT Short Term Goal 2 (Week 1): Pt will complete LB bathing sit to stand with min assist.  OT Short Term Goal 3 (Week 1): Pt will perform LB dressing sit to stand with min assist.  OT Short Term Goal 4 (Week 1): Pt will perform toilet transfer to the 3:1 with RW and supervision.    Skilled Therapeutic Interventions/Progress Updates:      Pt seen for BADL retraining of toileting, bathing, and dressing with a focus on sit to stand strength and standing balance.  Pt's ability to stand fluctuated throughout the session.  She did well the first time from the toilet and then did not need any assist in the shower as she used the grab bars.  During dressing she gradually needed increased A to rise from sitting and needs continual cues to push up with B hands and lean forward.  With her self care, pt is actually doing more for herself with bathing and dressing than she was prior to admission.  She was able to pull the pants over her hips with 50% A.  Pt feels her main obstacle [facility]  With standing, pt stated she felt dizzy. Her blood pressure was 153/72.  Pt's dizziness resolved after sitting a few minutes. She wanted to sit to brush her teeth, but with encouragement pt stood at sink. After oral care, pt given 3 oz of water. Pt had small sips but did cough a few times.  Pt resting in w/c with quick release belt on.  Therapy Documentation Precautions:  Precautions Precautions: Fall Precaution Comments: L hemiparesis Restrictions Weight Bearing Restrictions: No  Pain: Pain Assessment Pain Scale: 0-10 Pain Score: 0-No  pain ADL:    See Function Navigator for Current Functional Status.   Therapy/Group: Individual Therapy  Najah Liverman 03/03/2018, 12:48 PM

## 2018-03-03 NOTE — Progress Notes (Signed)
Speech Language Pathology Daily Session Note  Patient Details  Name: Susa Griffinsell Frankum MRN: [id number] Date of Birth: [birth year]  Today's Date: 03/03/2018 SLP Individual Time: 0802-0900 SLP Individual Time Calculation (min): 58 min  Short Term Goals: Week 1: SLP Short Term Goal 1 (Week 1): Pt will consume therapeutic trials of thin liquids with supervision cues for use of swallowing precautions and minimal overt s/s of aspiration over three consecutive sessions prior to advancement.  SLP Short Term Goal 2 (Week 1): Pt will consume therapeutic trials of mixed solids and thin liquid consistencies with supervision cues for use of swallowing precautions and minimal overt s/s of aspiration over three consecutive sessions prior to advancement.   SLP Short Term Goal 3 (Week 1): Pt will selectively attend to tasks in a mildly distracting environment for 30 minutes with supervision verbal cues for redirection to task.  SLP Short Term Goal 4 (Week 1): Pt will recall daily information with supervision verbal cues for use of external aids.   SLP Short Term Goal 5 (Week 1): Pt will complete basic, tasks with supervision cues for functional problem solving.    Skilled Therapeutic Interventions: Skilled ST services focused on swallow and cognitive skills. SLP facilitated PO consumption with proper positioning in bed, pt was slumped in bed eating breakfast meal. Pt demonstrated ability to follow basic commands during repositioning. Pt stated she was currently using the bathroom, SLP notified NT and assisted in changing of brief. SLP facilitated recall of functional information, pt demonstrated delayed recall of saturday's card game with supervision A question prompts, however recall swallow strategies required min A verbal cues. Pt demonstrated cough with and without PO consumption of NTL. SLP instructed pt to perform double swallow with NTL consumption, eliminating overt s/s aspiration. SLP posted water protocol sign in  sheet and explained the procedure to pt, pt stated understanding. SLP assisted with oral care prior to thin liquid cup sip trials, pt required supervision A question prompts to recall swallow strategies and demonstrated use of strategy during PO consumption of 3 0z and no overt aspiration noted. Pt was left in room with call bell within reach. Recommend to continue skilled ST services.       Function:  Eating Eating   Modified Consistency Diet: No(thin trials) Eating Assist Level: Set up assist for;Supervision or verbal cues   Eating Set Up Assist For: Opening containers;Cutting food       Cognition Comprehension Comprehension assist level: Follows basic conversation/direction with extra time/assistive device  Expression   Expression assist level: Expresses basic needs/ideas: With extra time/assistive device  Social Interaction Social Interaction assist level: Interacts appropriately 75 - 89% of the time - Needs redirection for appropriate language or to initiate interaction.  Problem Solving Problem solving assist level: Solves basic 75 - 89% of the time/requires cueing 10 - 24% of the time  Memory Memory assist level: Recognizes or recalls 75 - 89% of the time/requires cueing 10 - 24% of the time    Pain Pain Assessment Pain Scale: 0-10 Pain Score: 0-No pain  Therapy/Group: Individual Therapy  Yvonda Fouty  [address] [facility] 03/03/2018, 2:17 PM

## 2018-03-04 ENCOUNTER — Inpatient Hospital Stay (HOSPITAL_COMMUNITY): Payer: Medicare Other | Admitting: Physical Therapy

## 2018-03-04 ENCOUNTER — Inpatient Hospital Stay (HOSPITAL_COMMUNITY): Payer: Medicare Other | Admitting: Occupational Therapy

## 2018-03-04 ENCOUNTER — Inpatient Hospital Stay (HOSPITAL_COMMUNITY): Payer: Medicare Other | Admitting: Speech Pathology

## 2018-03-04 DIAGNOSIS — I169 Hypertensive crisis, unspecified: Secondary | ICD-10-CM

## 2018-03-04 LAB — GLUCOSE, CAPILLARY
GLUCOSE-CAPILLARY: 110 mg/dL — AB (ref 65–99)
GLUCOSE-CAPILLARY: 127 mg/dL — AB (ref 65–99)
Glucose-Capillary: 126 mg/dL — ABNORMAL HIGH (ref 65–99)
Glucose-Capillary: 168 mg/dL — ABNORMAL HIGH (ref 65–99)

## 2018-03-04 MED ORDER — LISINOPRIL 5 MG PO TABS
5.0000 mg | ORAL_TABLET | Freq: Every day | ORAL | Status: DC
  Administered 2018-03-04 – 2018-03-05 (×2): 5 mg via ORAL
  Filled 2018-03-04 (×3): qty 1

## 2018-03-04 NOTE — Progress Notes (Signed)
Speech Language Pathology Daily Session Note  Patient Details  Name: Charlene Greene MRN: [id number] Date of Birth: 07-12-1922  Today's Date: 03/04/2018 SLP Individual Time: 1110-1200 SLP Individual Time Calculation (min): 50 min  Short Term Goals: Week 1: SLP Short Term Goal 1 (Week 1): Pt will consume therapeutic trials of thin liquids with supervision cues for use of swallowing precautions and minimal overt s/s of aspiration over three consecutive sessions prior to advancement.  SLP Short Term Goal 2 (Week 1): Pt will consume therapeutic trials of mixed solids and thin liquid consistencies with supervision cues for use of swallowing precautions and minimal overt s/s of aspiration over three consecutive sessions prior to advancement.   SLP Short Term Goal 3 (Week 1): Pt will selectively attend to tasks in a mildly distracting environment for 30 minutes with supervision verbal cues for redirection to task.  SLP Short Term Goal 4 (Week 1): Pt will recall daily information with supervision verbal cues for use of external aids.   SLP Short Term Goal 5 (Week 1): Pt will complete basic, tasks with supervision cues for functional problem solving.    Skilled Therapeutic Interventions:  Pt was seen for skilled ST targeting goals for dysphagia and cognition.  SLP facilitated the session with trials of thin liquids and mixed consistencies to continue working towards diet progression.  Pt had delayed coughing with mixed consistencies but no coughing with thin liquids.  Given pt's prolonged history of chronic coughing both with and without PO intake.  It is difficult to determine whether coughing seen today is directly related to advanced textures.  Repeat MBS would be beneficial in determining safest diet consistencies.  SLP also facilitated the session with a novel card game to address problem solving and selective attention.  Pt needed supervision verbal cues for problem solving due to decreased working memory  of task rules and procedures.  She selectively attended to task in a moderately distracting environment for ~15 minutes with no cues needed for redirection.  Pt was handed off to OT while seated in wheelchair.  Continue per current plan of care.    Function:  Eating Eating   Modified Consistency Diet: Yes Eating Assist Level: More than reasonable amount of time;Set up assist for           Cognition Comprehension Comprehension assist level: Follows basic conversation/direction with extra time/assistive device  Expression   Expression assist level: Expresses basic needs/ideas: With no assist  Social Interaction Social Interaction assist level: Interacts appropriately 90% of the time - Needs monitoring or encouragement for participation or interaction.  Problem Solving Problem solving assist level: Solves basic 90% of the time/requires cueing < 10% of the time  Memory Memory assist level: Recognizes or recalls 90% of the time/requires cueing < 10% of the time    Pain Pain Assessment Pain Scale: 0-10 Pain Score: 0-No pain  Therapy/Group: Individual Therapy  Alexsander Cavins, Melanee SpryNicole L 03/04/2018, 1:15 PM

## 2018-03-04 NOTE — Progress Notes (Signed)
Physical Therapy Session Note  Patient Details  Name: Charlene Greene MRN: [id number] Date of Birth: Jan 16, 1922  Today's Date: 03/04/2018 PT Individual Time: 0815-0930 PT Individual Time Calculation (min): 75 min   Short Term Goals: Week 1:  PT Short Term Goal 1 (Week 1): Pt will perform bed mobility minA from hospital bed PT Short Term Goal 2 (Week 1): Pt will perform stand pivot transfer minA PT Short Term Goal 3 (Week 1): Pt will ambulate x25' minA  Skilled Therapeutic Interventions/Progress Updates: Pt received in bed finishing breakfast with handoff from RN; c/o pain in BLEs pre-medicated and agreeable to treatment. Encouraged pt to sit OOB to finish breakfast; supine>sit with HOB elevated and S, increased time to scoot toward EOB. Sit >stand min guard from slightly elevated bed with RW. Standing balance with S while therapist performed peri hygiene and brief change after pt reporting incontinence. Reinforced need to alert staff when incontinent for prompt brief change to reduce risk of skin breakdown; pt verbalizes understanding. Stand pivot to w/c with RW and min guard. Seated in w/c at sink pt performs grooming setupA. Stand pivot transfer w/c <mat table with RW and minA; requires cues for scooting hips forward in chair and "nose over toes" for anterior weight shift. Seated BLE level 1 theraband resisted dorsiflexion/plantarflexion 2x15; improved AROM and coordination compared to yesterdays session. Standing gastroc stretch/facilitation of closed chain dorsiflexion for ankle righting reactions on red foam wedge while performing peg board with RUE; min guard for balance and cues for hip extension/upright posture with visual cue to aim abdomen towards therapist's hand. Gait x20' with RW and min guard; RLE buckles x2 but maintains balance without additional assist. Pt reports incontinent bowel movement during gait. Returned to room totalA. Stand pivot minA to toilet, modA to return to w/c from low  toilet. Total A for clothing management and hygiene. Remained seated in w/c at end of session, quick release belt and chair alarm intact, all needs in reach.      Therapy Documentation Precautions:  Precautions Precautions: Fall Precaution Comments: L hemiparesis Restrictions Weight Bearing Restrictions: No   See Function Navigator for Current Functional Status.   Therapy/Group: Individual Therapy  Vista Lawmanlizabeth J Tygielski 03/04/2018, 9:46 AM

## 2018-03-04 NOTE — Progress Notes (Signed)
PHYSICAL MEDICINE & REHABILITATION     PROGRESS NOTE  Subjective/Complaints:  Pt seen lying in bed this AM.  She states she slept well overnight.  She notes her legs hurt.   ROS: +Leg pain. Denies CP, SOB, nausea, vomiting, diarrhea.  Objective: Vital Signs: Blood pressure (!) 177/74, pulse 62, temperature 97.7 F (36.5 C), temperature source Oral, resp. rate 20, height 5\' 5"  (1.651 m), weight 71.8 kg (158 lb 4.6 oz), SpO2 98 %. No results found. No results for input(s): WBC, HGB, HCT, PLT in the last 72 hours. No results for input(s): NA, K, CL, GLUCOSE, BUN, CREATININE, CALCIUM in the last 72 hours.  Invalid input(s): CO CBG (last 3)  Recent Labs    03/03/18 1634 03/03/18 2104 03/04/18 0637  GLUCAP 130* 153* 110*    Wt Readings from Last 3 Encounters:  03/04/18 71.8 kg (158 lb 4.6 oz)  02/22/18 75.9 kg (167 lb 5.3 oz)  09/26/16 82.4 kg (181 lb 9.6 oz)    Physical Exam:  BP (!) 177/74 (BP Location: Left Arm)   Pulse 62   Temp 97.7 F (36.5 C) (Oral)   Resp 20   Ht 5\' 5"  (1.651 m)   Wt 71.8 kg (158 lb 4.6 oz)   SpO2 98%   BMI 26.34 kg/m  Constitutional: She appears well-developed and well-nourished.  HENT: Normocephalic and atraumatic.  Eyes: EOM are normal. No discharge.  Cardiovascular: Cardiac rate controlled  Respiratory: Effort normal and breath sounds normal.  GI: Bowel sounds are normal. She exhibits no distension.  Musculoskeletal: LE edema and tenderness (stable) Neurological: She is alert.  Made good eye contact with examiner.   Fair awareness of deficits.   Follow commands.   Motor: RUE/RLE: 4-/5 proximal to distal LUE/LLE: 4-/5 proximal to distal (stronger than right, stable)  Skin: Skin is warm and dry Psychiatric: She has a normal mood and affect. Her behavior is normal.   Assessment/Plan: 1. Functional deficits secondary to left thalamic left occipital infarcts as well as intracranial stenosis as well as history of CVA 2017 which  require 3+ hours per day of interdisciplinary therapy in a comprehensive inpatient rehab setting. Physiatrist is providing close team supervision and 24 hour management of active medical problems listed below. Physiatrist and rehab team continue to assess barriers to discharge/monitor patient progress toward functional and medical goals.  Function:  Bathing Bathing position   Position: Shower  Bathing parts Body parts bathed by patient: Right arm, Left arm, Chest, Abdomen, Front perineal area, Right upper leg, Left upper leg, Left lower leg, Right lower leg, Buttocks Body parts bathed by helper: Buttocks, Back  Bathing assist Assist Level: Touching or steadying assistance(Pt > 75%)      Upper Body Dressing/Undressing Upper body dressing   What is the patient wearing?: Bra, Pull over shirt/dress Bra - Perfomed by patient: Thread/unthread right bra strap, Thread/unthread left bra strap Bra - Perfomed by helper: Hook/unhook bra (pull down sports bra) Pull over shirt/dress - Perfomed by patient: Thread/unthread right sleeve, Thread/unthread left sleeve, Pull shirt over trunk Pull over shirt/dress - Perfomed by helper: Put head through opening        Upper body assist Assist Level: Touching or steadying assistance(Pt > 75%)      Lower Body Dressing/Undressing Lower body dressing   What is the patient wearing?: Pants, Socks, Shoes, Underwear Underwear - Performed by patient: Thread/unthread right underwear leg Underwear - Performed by helper: Thread/unthread left underwear leg, Pull underwear up/down, Thread/unthread right  underwear leg Pants- Performed by patient: Thread/unthread right pants leg, Thread/unthread left pants leg Pants- Performed by helper: Pull pants up/down(pt assisted 50%)   Non-skid slipper socks- Performed by helper: Don/doff right sock, Don/doff left sock Socks - Performed by patient: Don/doff right sock, Don/doff left sock Socks - Performed by helper: Don/doff  right sock, Don/doff left sock Shoes - Performed by patient: Don/doff right shoe, Don/doff left shoe Shoes - Performed by helper: Don/doff right shoe, Don/doff left shoe          Lower body assist Assist for lower body dressing: (totalA)      Toileting Toileting   Toileting steps completed by patient: Adjust clothing prior to toileting, Performs perineal hygiene, Adjust clothing after toileting Toileting steps completed by helper: Adjust clothing prior to toileting, Adjust clothing after toileting, Performs perineal hygiene Toileting Assistive Devices: Grab bar or rail  Toileting assist Assist level: Touching or steadying assistance (Pt.75%)   Transfers Chair/bed transfer   Chair/bed transfer method: Stand pivot Chair/bed transfer assist level: Touching or steadying assistance (Pt > 75%) Chair/bed transfer assistive device: Walker, Designer, fashion/clothing     Max distance: 20 Assist level: Touching or steadying assistance (Pt > 75%)   Wheelchair Wheelchair activity did not occur: Safety/medical concerns   Max wheelchair distance: 30 Assist Level: Touching or steadying assistance (Pt > 75%)  Cognition Comprehension Comprehension assist level: Follows basic conversation/direction with no assist  Expression Expression assist level: Expresses basic needs/ideas: With no assist  Social Interaction Social Interaction assist level: Interacts appropriately 75 - 89% of the time - Needs redirection for appropriate language or to initiate interaction.  Problem Solving Problem solving assist level: Solves basic 90% of the time/requires cueing < 10% of the time  Memory Memory assist level: Recognizes or recalls 90% of the time/requires cueing < 10% of the time    Medical Problem List and Plan: 1.  Gait disorder with dysphagia secondary to left thalamic left occipital infarcts as well as intracranial stenosis on 4/13 as well as history of CVA 2017  Cont CIR  2.  DVT  Prophylaxis/Anticoagulation: SCDs.     Vascular study neg for DVT, however, suggestive of Baker's cyst, with edema and femoral artery calcification 3. Pain Management: Tylenol as needed 4. Mood: Provide emotional support 5. Neuropsych: This patient is ?fully capable of making decisions on her own behalf. 6. Skin/Wound Care: Routine skin checks 7. Fluids/Electrolytes/Nutrition: Routine I/O's    BMP within acceptable range on 4/18 8.  Dysphagia.  Dysphasia #3 nectar liquids.  Follow-up speech therapy   Advance diet as tolerated 9.  Diastolic congestive heart failure.  Monitor for any signs of fluid overload Filed Weights   02/26/18 1846 03/04/18 0435  Weight: 76.1 kg (167 lb 12.3 oz) 71.8 kg (158 lb 4.6 oz)   10.  Sick sinus syndrome status post pacemaker.  Follow-up per cardiology services 11.  Diabetes mellitus peripheral neuropathy.  Hemoglobin A1c 6.9.  SSI.  Check blood sugars before meals and at bedtime.    Patient on Amaryl 2 mg daily prior to admission.  Resume as needed   Amaryl resumed on 4/20  Lisinopril 5 started on 4/23 CBG (last 3)  Recent Labs    03/03/18 1634 03/03/18 2104 03/04/18 0637  GLUCAP 130* 153* 110*   Improving on 4/23 12.  Hypertension.  Toprol-XL 100 mg daily, Norvasc 5 mg twice daily.      Vitals:   03/03/18 2002 03/04/18 0435  BP: Marland Kitchen)  169/66 (!) 177/74  Pulse:  62  Resp:  20  Temp:  97.7 F (36.5 C)  SpO2:  98%   Hypertensive crisis in last 24 hours 13.  Hyperlipidemia.  Zetia 14. Hypoalbuminemia  Supplement initiated on 4/15  15. PAD -has fairly consistent pain in BLE with partial relief using tylenol, fem art calcification likely chronic  Patient declining further workup    LOS (Days) 6 A FACE TO FACE EVALUATION WAS PERFORMED  Tarek Cravens Karis Juba 03/04/2018 10:02 AM

## 2018-03-04 NOTE — Progress Notes (Signed)
Occupational Therapy Session Note  Patient Details  Name: Charlene Greene MRN: [id number] Date of Birth: 14-Jan-1922  Today's Date: 03/04/2018 OT Individual Time: 9030-0923 and 1200-1230 OT Individual Time Calculation (min): 45 min and 30 min   Short Term Goals: Week 1:  OT Short Term Goal 1 (Week 1): Pt will donn pullover shirt with supervision sitting unsupported.  OT Short Term Goal 2 (Week 1): Pt will complete LB bathing sit to stand with min assist.  OT Short Term Goal 3 (Week 1): Pt will perform LB dressing sit to stand with min assist.  OT Short Term Goal 4 (Week 1): Pt will perform toilet transfer to the 3:1 with RW and supervision.    Skilled Therapeutic Interventions/Progress Updates:   Visit 1:  No c/o pain Pt received in w/c in gown. Pt did not want to take a full bath as she had showered yesterday. Pt "freshened" up her UB and then donned bra and shirt with min A.  She donned pants over feet and stood to RW with mod A to rise to stand.  She pulled pants up 80% of the way but had difficulty fully pulling over her hips.  Pt continues to need assist with her stockings and shoes.  Pt taken to gym to work on sit to stand skills and standing balance.  She continues to need cues to push up with B hands and fully shift her weight forward to rise to be able to stand with min A.  Once standing, pt holding parallel bars and worked on wt shifts side to side.  She began alternating forward taps. On the 7th time her R knee buckled suddenly and pt began to fall. Total A to prevent fall and other therapist slid her w/c behind her.  Pt stated "My knee just gave out".  Pt was relaxed and not startled by the event.  Focused on RLE strengthening with AROM exercises.  Pt returned to room with chair alarm on and all needs met.  Visit 2:  No c/o pain  Pt seen this session to facilitate safe swallowing strategies during her lunch.  She did try to take 2 bites that were too large and cued to put a smaller  amount on her fork.  For the majority of the meal, no cough. After 30 min pt did begin to cough a few times. Pt drank V8 juice with small sips.  Pt continuing to complete her meal as she only ate 50% after 30 min. RN and NT aware.      Therapy Documentation Precautions:  Precautions Precautions: Fall Precaution Comments: L hemiparesis Restrictions Weight Bearing Restrictions: No  See Function Navigator for Current Functional Status.   Therapy/Group: Individual Therapy  [place] 03/04/2018, 11:20 AM

## 2018-03-05 ENCOUNTER — Inpatient Hospital Stay (HOSPITAL_COMMUNITY): Payer: Medicare Other | Admitting: Physical Therapy

## 2018-03-05 ENCOUNTER — Inpatient Hospital Stay (HOSPITAL_COMMUNITY): Payer: Medicare Other | Admitting: Occupational Therapy

## 2018-03-05 ENCOUNTER — Inpatient Hospital Stay (HOSPITAL_COMMUNITY): Payer: Medicare Other

## 2018-03-05 ENCOUNTER — Encounter (HOSPITAL_COMMUNITY): Payer: Medicare Other | Admitting: Speech Pathology

## 2018-03-05 DIAGNOSIS — I69321 Dysphasia following cerebral infarction: Secondary | ICD-10-CM

## 2018-03-05 DIAGNOSIS — I10 Essential (primary) hypertension: Secondary | ICD-10-CM

## 2018-03-05 LAB — GLUCOSE, CAPILLARY
Glucose-Capillary: 114 mg/dL — ABNORMAL HIGH (ref 65–99)
Glucose-Capillary: 127 mg/dL — ABNORMAL HIGH (ref 65–99)
Glucose-Capillary: 197 mg/dL — ABNORMAL HIGH (ref 65–99)
Glucose-Capillary: 123 mg/dL — ABNORMAL HIGH (ref 65–99)

## 2018-03-05 MED ORDER — PANTOPRAZOLE SODIUM 20 MG PO TBEC
20.0000 mg | DELAYED_RELEASE_TABLET | Freq: Every day | ORAL | Status: DC
  Administered 2018-03-05 – 2018-03-13 (×9): 20 mg via ORAL
  Filled 2018-03-05 (×10): qty 1

## 2018-03-05 NOTE — Progress Notes (Addendum)
Social Work Patient ID: Charlene Greene, female   DOB: 18-Jun-1922, 84 y.o.   MRN: [id number]  Met with pt and left message for [name]-son regarding team conference goals and target discharge date 5/2. Pt was very pleased she passed her swallow test. Also contacted [facility] and spoke with a wellness nurse to inform of target discharge and what paperwork they needed when pt returns. Will work on discharge needs. Son called back and was pleased with the plan and discharge date.

## 2018-03-05 NOTE — Progress Notes (Signed)
Social Work   Charlene Greene, Charlene Risingebecca G, LCSW  Social Worker  Physical Medicine and Rehabilitation  Patient Care Conference  Signed  Date of Service:  03/05/2018  3:00 PM          Signed          Show:Clear all [x] Manual[x] Template[] Copied  Added by: [x] Charlene Greene, Charlene Livingsebecca G, LCSW   [] Hover for details   Inpatient RehabilitationTeam Conference and Plan of Care Update Date: 03/05/2018   Time: 10:25 AM      Patient Name: Charlene Greene      Medical Record Number: [id number]  Date of Birth: 1922/06/16 Sex: Female         Room/Bed: 4M09C/4M09C-01 Payor Info: Payor: MEDICARE / Plan: MEDICARE PART A AND B / Product Type: *No Product type* /     Admitting Diagnosis: cva  Admit Date/Time:  02/26/2018  6:47 PM Admission Comments: No comment available    Primary Diagnosis:  <principal problem not specified> Principal Problem: <principal problem not specified>       Patient Active Problem List    Diagnosis Date Noted  . Essential hypertension    . Dysphasia, post-stroke    . PAD (peripheral artery disease) (HCC)    . Hypoalbuminemia due to protein-calorie malnutrition (HCC)    . Diabetic peripheral neuropathy (HCC)    . Diabetes mellitus type 2 in nonobese (HCC)    . Chronic diastolic congestive heart failure (HCC)    . Left thalamic infarction (HCC) 02/26/2018  . Dysphagia, post-stroke    . Acute on chronic diastolic congestive heart failure (HCC)    . Type 2 diabetes mellitus with peripheral neuropathy (HCC)    . TIA (transient ischemic attack) 02/22/2018  . Labile blood glucose    . Hypotension due to drugs    . Hyponatremia    . Renovascular hypertension    . Hypertensive crisis    . Type 2 diabetes mellitus with diabetic autonomic neuropathy, without long-term current use of insulin (HCC)    . Stage 3 chronic kidney disease (HCC)    . Acute lower UTI    . Acute renal insufficiency 09/27/2016  . Urinary urgency 09/27/2016  . Basal ganglia infarction (HCC) 09/26/2016  .  Neuropathic pain    . Benign essential HTN    . Slow transit constipation    . Elevated creatine kinase    . Accelerated hypertension 09/24/2016  . Diabetes mellitus (HCC) 09/24/2016  . Hyperlipidemia 09/24/2016  . Sick sinus syndrome (HCC) 09/24/2016  . CVA (cerebral vascular accident) (HCC) 09/23/2016  . Stroke (cerebrum) (HCC) 09/23/2016      Expected Discharge Date: Expected Discharge Date: 03/13/18   Team Members Present: Physician leading conference: Dr. Maryla MorrowAnkit Greene Social Worker Present: Charlene DerBecky Zhanna Melin, LCSW Nurse Present: Charlene Bodeeborah Sharp, RN PT Present: Charlene Greene, PT OT Present: Charlene Greene, OT SLP Present: Charlene Greene, SLP PPS Coordinator present : Charlene DuckMarie Noel, RN, CRRN       Current Status/Progress Goal Weekly Team Focus  Medical     Gait disorder with dysphagia secondary to left thalamic left occipital infarcts as well as intracranial stenosis on 4/13 as well as history of CVA 2017  Improve mobility, self-care, DM/BP, LE pain  See above   Bowel/Bladder     Continent with incontinent episodes LBM 04/23  pt will be continent of B/B with regular bowel pattern  toilet pt every two hours    Swallow/Nutrition/ Hydration     recommend upgrading to thin liquids vai cup or straw  Mod I   dysphagia 3 with thin liquids, intermittent supervision   ADL's     min A bathing, UB dressing;  mod A toileting/ toilet transfer; mod A LB dressing; min A standing balance  S standing balance and bathing and toileting/ toilet transfer, min A dressing  ADL training, functional mobility, balance   Mobility     S/minA bed mobility, min>modA sit <>stand and transfers, gait min guard x20'   S overall  LE strengthening, activity tolerance, dynamic balance   Communication               Safety/Cognition/ Behavioral Observations   min assist-supervision   supervision   selective attention, recall of daily/new information, problem solving during ADLs   Pain     Pt c/o generalized pain  relieved with tylenol prn  pain <3  assess pain q shift and prn notify MD for unrelieved pain   Skin     scratches on ankles   healing scratches no further breakdown of skin   assess skin q shift and prn     *See Care Plan and progress notes for long and short-term goals.      Barriers to Discharge   Current Status/Progress Possible Resolutions Date Resolved   Physician     Medical stability;Decreased caregiver support;Lack of/limited family support;Weight     See above  Therapies, optimize BP/HTN meds, Vascular consult      Nursing                 PT                    OT                 SLP            SW              Discharge Planning/Teaching Needs:  To return to [facility], may need to have more assist when goes back there.      Team Discussion:  Progressing toward her goals of supervision-min assist level. Passed MBS now on thin liquids. MD adjusting diabetes and BP meds. Consulted vascular for leg pain-dopplers were negative. Levels flucuate due to endurance and fatigue level. Has assist at ALF for bathing and dressing.  Revisions to Treatment Plan:  DC 5/2    Continued Need for Acute Rehabilitation Level of Care: The patient requires daily medical management by a physician with specialized training in physical medicine and rehabilitation for the following conditions: Daily direction of a multidisciplinary physical rehabilitation program to ensure safe treatment while eliciting the highest outcome that is of practical value to the patient.: Yes Daily medical management of patient stability for increased activity during participation in an intensive rehabilitation regime.: Yes Daily analysis of laboratory values and/or radiology reports with any subsequent need for medication adjustment of medical intervention for : Neurological problems;Diabetes problems;Blood pressure problems   Charlene Greene, Charlene Livings 03/05/2018, 3:00 PM                 Patient ID: Charlene Greene, female   DOB: 1922-09-29, 84 y.o.   MRN: [id number]

## 2018-03-05 NOTE — Progress Notes (Signed)
Occupational Therapy Session Note  Patient Details  Name: Charlene Greene MRN: [id number] Date of Birth: 10-23-1922  Today's Date: 03/05/2018 OT Individual Time: 1300-1401 OT Individual Time Calculation (min): 61 min    Short Term Goals: Week 1:  OT Short Term Goal 1 (Week 1): Pt will donn pullover shirt with supervision sitting unsupported.  OT Short Term Goal 2 (Week 1): Pt will complete LB bathing sit to stand with min assist.  OT Short Term Goal 3 (Week 1): Pt will perform LB dressing sit to stand with min assist.  OT Short Term Goal 4 (Week 1): Pt will perform toilet transfer to the 3:1 with RW and supervision.    Skilled Therapeutic Interventions/Progress Updates:    Pt completed toilet transfer to begin session with use of the RW for support.  Mod assist needed for scooting forward from the back of the wheelchair and then for sit to stand.  Pt needs mod instructional cueing for hand placement with sit to stand as well as for stepping forward with the RLE first.  After ambulating approximately 8 ft pt's right knee would occasionally buckle and her trunk would slump forward, requiring a rest break before reaching the toilet to sit.  She finished ambulation after a 2 minute rest break with max assist for toilet hygiene and clothing management.  She needed a rest break again before reaching the wheelchair out next to the side of the bed.  Next had pt work on UE strengthening from wheelchair level in the therapy gym.  Min facilitation needed to complete shoulder flexion to 90 degrees with BUEs using 1lb dowel rod.  She was able to perform 3 sets.  Two sets of 10 reps elbow flexion bilaterally with use of the dowel rod as well.  Finished by having pt use the rod to hit a ball back to the therapist tossing it to them.  Finished session with call button and phone in reach and pt back in the room waiting for next PT session.    Therapy Documentation Precautions:  Precautions Precautions:  Fall Precaution Comments: L hemiparesis Restrictions Weight Bearing Restrictions: No  Pain: Pain Assessment Pain Scale: Faces Faces Pain Scale: Hurts little more Pain Type: Acute pain Pain Location: Leg Pain Orientation: Right;Left Pain Descriptors / Indicators: Aching Pain Onset: On-going Pain Intervention(s): Repositioned;Emotional support ADL: See Function Navigator for Current Functional Status.   Therapy/Group: Individual Therapy  Daurice Ovando OTR/L 03/05/2018, 4:15 PM

## 2018-03-05 NOTE — Progress Notes (Signed)
Modified Barium Swallow Progress Note  Patient Details  Name: Charlene Greene MRN: [id number] Date of Birth: 1922-08-13  Today's Date: 03/05/2018  Modified Barium Swallow completed.  Full report located under Chart Review in the Imaging Section.  Brief recommendations include the following:  Clinical Impression  Pt presents with very mild pharyngeal phase dysphagia c/b delayed swallow initiation to vaculla and occasional delayed to the pyriform sinuses with multiple sips. This results in very faint flash penetration of thin liquids during consecutive cup sips or straw sips. No aspiration observed. Pt also able to protect airway with mixed consistencies and barium tablet whole with thin liquids via straw. Pt also presents with mild residue in pyriform sinus but was always contained. Recommend upgrading to thin liquids via cup or straw and medicine whole. Intermittent supervision. Of note, pt with cough before and after study. This cough was not related to any instances of penetration or residue.    Swallow Evaluation Recommendations       SLP Diet Recommendations: Dysphagia 3 (Mech soft) solids;Thin liquid   Liquid Administration via: Cup;Straw   Medication Administration: Whole meds with liquid   Supervision: Intermittent supervision to cue for compensatory strategies;Patient able to self feed   Compensations: Slow rate;Small sips/bites   Postural Changes: Seated upright at 90 degrees;Remain semi-upright after after feeds/meals (Comment)   Oral Care Recommendations: Oral care BID        Maisa Bedingfield 03/05/2018,9:46 AM

## 2018-03-05 NOTE — Progress Notes (Signed)
[facility] PHYSICAL [facility]ICINE & REHABILITATION     PROGRESS NOTE  Subjective/Complaints:  Patient seen sitting up in her chair this morning.  She states she slept fairly overnight.  Had discussion with patient regarding lower extremity pain.  She is now amenable to potential vascular intervention.  ROS: Denies CP, SOB, nausea, vomiting, diarrhea.  Objective: Vital Signs: Blood pressure (!) 158/67, pulse (!) 59, temperature 98.1 F (36.7 C), temperature source Oral, resp. rate 17, height 5\' 5"  (1.651 m), weight 71.8 kg (158 lb 4.6 oz), SpO2 93 %. No results found. No results for input(s): WBC, HGB, HCT, PLT in the last 72 hours. No results for input(s): NA, K, CL, GLUCOSE, BUN, CREATININE, CALCIUM in the last 72 hours.  Invalid input(s): CO CBG (last 3)  Recent Labs    03/04/18 1653 03/04/18 2148 03/05/18 0615  GLUCAP 127* 168* 114*    Wt Readings from Last 3 Encounters:  03/04/18 71.8 kg (158 lb 4.6 oz)  02/22/18 75.9 kg (167 lb 5.3 oz)  09/26/16 82.4 kg (181 lb 9.6 oz)    Physical Exam:  BP (!) 158/67 (BP Location: Left Arm)   Pulse (!) 59   Temp 98.1 F (36.7 C) (Oral)   Resp 17   Ht 5\' 5"  (1.651 m)   Wt 71.8 kg (158 lb 4.6 oz)   SpO2 93%   BMI 26.34 kg/m  Constitutional: She appears well-developed and well-nourished.  HENT: Normocephalic and atraumatic.  Eyes: EOM are normal. No discharge.  Cardiovascular: Cardiac rate controlled  Respiratory: Effort normal and breath sounds normal.  GI: Bowel sounds are normal. She exhibits no distension.  Musculoskeletal: LE edema and tenderness (unchanged) Neurological: She is alert.  Made good eye contact with examiner.   Fair awareness of deficits.   Follow commands.   Motor: RUE/RLE: 4/5 proximal to distal LUE/LLE: 4/5 proximal to distal (stronger than right, stable)  Skin: Skin is warm and dry Psychiatric: She has a normal mood and affect. Her behavior is normal.   Assessment/Plan: 1. Functional deficits secondary  to left thalamic left occipital infarcts as well as intracranial stenosis as well as history of CVA 2017 which require 3+ hours per day of interdisciplinary therapy in a comprehensive inpatient rehab setting. Physiatrist is providing close team supervision and 24 hour management of active medical problems listed below. Physiatrist and rehab team continue to assess barriers to discharge/monitor patient progress toward functional and medical goals.  Function:  Bathing Bathing position   Position: Shower  Bathing parts Body parts bathed by patient: Right arm, Left arm, Chest, Abdomen, Front perineal area, Right upper leg, Left upper leg, Left lower leg, Right lower leg, Buttocks Body parts bathed by helper: Buttocks, Back  Bathing assist Assist Level: Touching or steadying assistance(Pt > 75%)      Upper Body Dressing/Undressing Upper body dressing   What is the patient wearing?: Bra, Pull over shirt/dress Bra - Perfomed by patient: Thread/unthread right bra strap, Thread/unthread left bra strap Bra - Perfomed by helper: Hook/unhook bra (pull down sports bra) Pull over shirt/dress - Perfomed by patient: Thread/unthread right sleeve, Thread/unthread left sleeve, Pull shirt over trunk Pull over shirt/dress - Perfomed by helper: Put head through opening        Upper body assist Assist Level: Touching or steadying assistance(Pt > 75%)      Lower Body Dressing/Undressing Lower body dressing   What is the patient wearing?: Pants, Socks, Shoes, Underwear Underwear - Performed by patient: Thread/unthread right underwear leg Underwear -  Performed by helper: Thread/unthread left underwear leg, Pull underwear up/down, Thread/unthread right underwear leg Pants- Performed by patient: Thread/unthread right pants leg, Thread/unthread left pants leg Pants- Performed by helper: Pull pants up/down(pt assisted 50%)   Non-skid slipper socks- Performed by helper: Don/doff right sock, Don/doff left  sock Socks - Performed by patient: Don/doff right sock, Don/doff left sock Socks - Performed by helper: Don/doff right sock, Don/doff left sock Shoes - Performed by patient: Don/doff right shoe, Don/doff left shoe Shoes - Performed by helper: Don/doff right shoe, Don/doff left shoe          Lower body assist Assist for lower body dressing: (totalA)      Toileting Toileting   Toileting steps completed by patient: Performs perineal hygiene Toileting steps completed by helper: Adjust clothing prior to toileting, Performs perineal hygiene, Adjust clothing after toileting Toileting Assistive Devices: Grab bar or rail  Toileting assist Assist level: Touching or steadying assistance (Pt.75%)   Transfers Chair/bed transfer   Chair/bed transfer method: Stand pivot Chair/bed transfer assist level: Touching or steadying assistance (Pt > 75%) Chair/bed transfer assistive device: Walker, Designer, fashion/clothing     Max distance: 20 Assist level: Touching or steadying assistance (Pt > 75%)   Wheelchair Wheelchair activity did not occur: Safety/medical concerns   Max wheelchair distance: 30 Assist Level: Touching or steadying assistance (Pt > 75%)  Cognition Comprehension Comprehension assist level: Follows basic conversation/direction with extra time/assistive device  Expression Expression assist level: Expresses basic needs/ideas: With no assist  Social Interaction Social Interaction assist level: Interacts appropriately 90% of the time - Needs monitoring or encouragement for participation or interaction.  Problem Solving Problem solving assist level: Solves basic 90% of the time/requires cueing < 10% of the time  Memory Memory assist level: Recognizes or recalls 90% of the time/requires cueing < 10% of the time    Medical Problem List and Plan: 1.  Gait disorder with dysphagia secondary to left thalamic left occipital infarcts as well as intracranial stenosis on 4/13 as  well as history of CVA 2017  Cont CIR  2.  DVT Prophylaxis/Anticoagulation: SCDs.     Vascular study neg for DVT, however, suggestive of Baker's cyst, with edema and femoral artery calcification 3. Pain Management: Tylenol as needed 4. Mood: Provide emotional support 5. Neuropsych: This patient is ?fully capable of making decisions on her own behalf. 6. Skin/Wound Care: Routine skin checks 7. Fluids/Electrolytes/Nutrition: Routine I/O's    BMP within acceptable range on 4/18 8.  Dysphagia.  Dysphasia #3 nectar liquids.  Follow-up speech therapy   Advance diet as tolerated 9.  Diastolic congestive heart failure.  Monitor for any signs of fluid overload Filed Weights   02/26/18 1846 03/04/18 0435  Weight: 76.1 kg (167 lb 12.3 oz) 71.8 kg (158 lb 4.6 oz)   10.  Sick sinus syndrome status post pacemaker.  Follow-up per cardiology services 11.  Diabetes mellitus peripheral neuropathy.  Hemoglobin A1c 6.9.  SSI.  Check blood sugars before meals and at bedtime.    Patient on Amaryl 2 mg daily prior to admission.  Resume as needed   Amaryl resumed on 4/20 CBG (last 3)  Recent Labs    03/04/18 1653 03/04/18 2148 03/05/18 0615  GLUCAP 127* 168* 114*   Slightly labile, however overall controlled on 4/24 12.  Hypertension.  Toprol-XL 100 mg daily, Norvasc 5 mg twice daily.      Vitals:   03/04/18 2108 03/05/18 0038  BP: Marland Kitchen)  158/69 (!) 158/67  Pulse: (!) 59 (!) 59  Resp:  17  Temp:  98.1 F (36.7 C)  SpO2:  93%   Lisinopril 5 started on 4/23  Remains elevated, consider further medication adjustments tomorrow 13.  Hyperlipidemia.  Zetia 14. Hypoalbuminemia  Supplement initiated on 4/15  15. PAD -has fairly consistent pain in BLE with partial relief using tylenol, fem art calcification likely chronic  Patient now amenable to further work-up, will follow-up with vascular   LOS (Days) 7 A FACE TO FACE EVALUATION WAS PERFORMED  Charlene Greene Karis Juba 03/05/2018 9:03 AM

## 2018-03-05 NOTE — Progress Notes (Signed)
Physical Therapy Session Note  Patient Details  Name: Charlene Greene MRN: [id number] Date of Birth: 07-26-1922  Today's Date: 03/05/2018 PT Individual Time: 1000-1031 PT Individual Time Calculation (min): 31 min   Short Term Goals: Week 1:  PT Short Term Goal 1 (Week 1): Pt will perform bed mobility minA from hospital bed PT Short Term Goal 2 (Week 1): Pt will perform stand pivot transfer minA PT Short Term Goal 3 (Week 1): Pt will ambulate x25' minA  Skilled Therapeutic Interventions/Progress Updates:    Pt in bed upon arrival and agreeable to PT session. Bed mobility performed with min assistance and cues for sequencing. Sitting EOB pt needing min assist initially and progressing to supervision with time and cues. Transfers: sit<>stand min-mod assist. Cues needed for encouragement for forward lean, no cues needed for hand placement. Gait: ambulating 10 ft X1 and 25 ft X1 with rw and min assist. 1 episode of knee buckling with min assist for recovery. Pt up in w/c with all needs in reach and chair alarm on. Pt denies needs.   Therapy Documentation Precautions:  Precautions Precautions: Fall Precaution Comments: L hemiparesis Restrictions Weight Bearing Restrictions: No  Pain:  Pt denies pain.   See Function Navigator for Current Functional Status.   Therapy/Group: Individual Therapy  Delton SeeBenjamin Carollyn Etcheverry, PT 03/05/2018, 11:27 AM

## 2018-03-05 NOTE — Progress Notes (Signed)
Physical Therapy Session Note  Patient Details  Name: Charlene Greene MRN: [id number] Date of Birth: [birth year]  Today's Date: 03/05/2018 PT Individual Time: 1400-1415 PT Individual Time Calculation (min): 15 min   Short Term Goals: Week 1:  PT Short Term Goal 1 (Week 1): Pt will perform bed mobility minA from hospital bed PT Short Term Goal 2 (Week 1): Pt will perform stand pivot transfer minA PT Short Term Goal 3 (Week 1): Pt will ambulate x25' minA  Skilled Therapeutic Interventions/Progress Updates: Pt received seated in w/c, denies pain but reports significant fatigue from "busy day", and agreeable to treatment. Attempted sit>stand x3 trials with min/modA from therapist to facilitate anterior weight shift however minimal effort provided by pt, stating "my push went out the door". Late lunch tray arrived and pt reporting she hadn't eaten much breakfast, requests to eat at this time. Provided setupA for opening containers, retrieving towel, napkins, etc as needed. Pt remained seated in w/c, quick release belt intact, all needs in reach. NT alerted to pt position and need for intermittent S.     Therapy Documentation Precautions:  Precautions Precautions: Fall Precaution Comments: L hemiparesis Restrictions Weight Bearing Restrictions: No PT Missed Time: 15 min (late lunch tray)  See Function Navigator for Current Functional Status.   Therapy/Group: Individual Therapy  Vista Lawmanlizabeth J Tygielski 03/05/2018, 2:22 PM

## 2018-03-05 NOTE — Progress Notes (Signed)
Physical Therapy Session Note  Patient Details  Name: Charlene Greene MRN: [id number] Date of Birth: 1922-03-23  Today's Date: 03/05/2018 PT Individual Time: 5366-4403 PT Individual Time Calculation (min): 43 min   Short Term Goals: Week 1:  PT Short Term Goal 1 (Week 1): Pt will perform bed mobility minA from hospital bed PT Short Term Goal 2 (Week 1): Pt will perform stand pivot transfer minA PT Short Term Goal 3 (Week 1): Pt will ambulate x25' minA Week 2:    Week 3:     Skilled Therapeutic Interventions/Progress Updates:   Pt received sitting in WC and agreeable to PT  WC mobility x 74f ft with supervision assist from PT. Min cues for equal force through BUE to maintain straight path.   [facility]. Pt requires max multimodal cues for set up of UE, LE and technique including anterior weight shift use of UE from WC.   Gait training x 571fwith RW;  R knee buckle requiring Max-total assist from PT to prevent fall. Additional gait training x 1540fith mod assist, R anterior support AFO and RW. PT required to facilitie weight shift to the L to allow increased foot clearance on the R as well as provide mod-max cues for step to gait pattern to minimize to drag on the Right. Poor carryover beyond 2 steps. Pt reports incontinent episode follow gait training.   Stand pivot transfer to [facility]. Moderate cues for posture and gait pattern with transfer to WC.   Patient returned to room and left sitting in [facility] reach (RN made aware of incontinence) and all needs met.          Therapy Documentation Precautions:  Precautions Precautions: Fall Precaution Comments: L hemiparesis Restrictions Weight Bearing Restrictions: No   Pain: Denies at rest.   See Function Navigator for Current Functional Status.   Therapy/Group: Individual Therapy  AusLorie Phenix24/2019, 11:29 AM

## 2018-03-05 NOTE — Patient Care Conference (Signed)
Inpatient RehabilitationTeam Conference and Plan of Care Update Date: 03/05/2018   Time: 10:25 AM    Patient Name: Charlene Greene      Medical Record Number: [id number]  Date of Birth: 1922/03/03 Sex: Female         Room/Bed: 4M09C/4M09C-01 Payor Info: Payor: MEDICARE / Plan: MEDICARE PART A AND B / Product Type: *No Product type* /    Admitting Diagnosis: cva  Admit Date/Time:  02/26/2018  6:47 PM Admission Comments: No comment available   Primary Diagnosis:  <principal problem not specified> Principal Problem: <principal problem not specified>  Patient Active Problem List   Diagnosis Date Noted  . Essential hypertension   . Dysphasia, post-stroke   . PAD (peripheral artery disease) (HCC)   . Hypoalbuminemia due to protein-calorie malnutrition (HCC)   . Diabetic peripheral neuropathy (HCC)   . Diabetes mellitus type 2 in nonobese (HCC)   . Chronic diastolic congestive heart failure (HCC)   . Left thalamic infarction (HCC) 02/26/2018  . Dysphagia, post-stroke   . Acute on chronic diastolic congestive heart failure (HCC)   . Type 2 diabetes mellitus with peripheral neuropathy (HCC)   . TIA (transient ischemic attack) 02/22/2018  . Labile blood glucose   . Hypotension due to drugs   . Hyponatremia   . Renovascular hypertension   . Hypertensive crisis   . Type 2 diabetes mellitus with diabetic autonomic neuropathy, without long-term current use of insulin (HCC)   . Stage 3 chronic kidney disease (HCC)   . Acute lower UTI   . Acute renal insufficiency 09/27/2016  . Urinary urgency 09/27/2016  . Basal ganglia infarction (HCC) 09/26/2016  . Neuropathic pain   . Benign essential HTN   . Slow transit constipation   . Elevated creatine kinase   . Accelerated hypertension 09/24/2016  . Diabetes mellitus (HCC) 09/24/2016  . Hyperlipidemia 09/24/2016  . Sick sinus syndrome (HCC) 09/24/2016  . CVA (cerebral vascular accident) (HCC) 09/23/2016  . Stroke (cerebrum) (HCC) 09/23/2016     Expected Discharge Date: Expected Discharge Date: 03/13/18  Team Members Present: Physician leading conference: Dr. Maryla Morrow Social Worker Present: Dossie Der, LCSW Nurse Present: Chana Bode, RN PT Present: Alyson Reedy, PT OT Present: Perrin Maltese, OT SLP Present: Reuel Derby, SLP PPS Coordinator present : Tora Duck, RN, CRRN     Current Status/Progress Goal Weekly Team Focus  Medical   Gait disorder with dysphagia secondary to left thalamic left occipital infarcts as well as intracranial stenosis on 4/13 as well as history of CVA 2017  Improve mobility, self-care, DM/BP, LE pain  See above   Bowel/Bladder   Continent with incontinent episodes LBM 04/23  pt will be continent of B/B with regular bowel pattern  toilet pt every two hours    Swallow/Nutrition/ Hydration   recommend upgrading to thin liquids vai cup or straw  Mod I   dysphagia 3 with thin liquids, intermittent supervision   ADL's   min A bathing, UB dressing;  mod A toileting/ toilet transfer; mod A LB dressing; min A standing balance  S standing balance and bathing and toileting/ toilet transfer, min A dressing  ADL training, functional mobility, balance   Mobility   S/minA bed mobility, min>modA sit <>stand and transfers, gait min guard x20'   S overall  LE strengthening, activity tolerance, dynamic balance   Communication             Safety/Cognition/ Behavioral Observations  min assist-supervision   supervision   selective attention,  recall of daily/new information, problem solving during ADLs   Pain   Pt c/o generalized pain relieved with tylenol prn  pain <3  assess pain q shift and prn notify MD for unrelieved pain   Skin   scratches on ankles   healing scratches no further breakdown of skin   assess skin q shift and prn      *See Care Plan and progress notes for long and short-term goals.     Barriers to Discharge  Current Status/Progress Possible Resolutions Date Resolved    Physician    Medical stability;Decreased caregiver support;Lack of/limited family support;Weight     See above  Therapies, optimize BP/HTN meds, Vascular consult      Nursing                  PT                    OT                  SLP                SW                Discharge Planning/Teaching Needs:  [facility] may need to have more assist when goes back there.      Team Discussion:  Progressing toward her goals of supervision-min assist level. Passed MBS now on thin liquids. MD adjusting diabetes and BP meds. Consulted vascular for leg pain-dopplers were negative. Levels flucuate due to endurance and fatigue level. Has assist at ALF for bathing and dressing.  Revisions to Treatment Plan:  DC 5/2    Continued Need for Acute Rehabilitation Level of Care: The patient requires daily medical management by a physician with specialized training in physical medicine and rehabilitation for the following conditions: Daily direction of a multidisciplinary physical rehabilitation program to ensure safe treatment while eliciting the highest outcome that is of practical value to the patient.: Yes Daily medical management of patient stability for increased activity during participation in an intensive rehabilitation regime.: Yes Daily analysis of laboratory values and/or radiology reports with any subsequent need for medication adjustment of medical intervention for : Neurological problems;Diabetes problems;Blood pressure problems  Timberlyn Pickford, Lemar LivingsRebecca G 03/05/2018, 3:00 PM

## 2018-03-06 ENCOUNTER — Inpatient Hospital Stay (HOSPITAL_COMMUNITY): Payer: Medicare Other | Admitting: Physical Therapy

## 2018-03-06 ENCOUNTER — Inpatient Hospital Stay (HOSPITAL_COMMUNITY): Payer: Medicare Other | Admitting: Occupational Therapy

## 2018-03-06 ENCOUNTER — Inpatient Hospital Stay (HOSPITAL_COMMUNITY): Payer: Medicare Other | Admitting: Speech Pathology

## 2018-03-06 DIAGNOSIS — R0989 Other specified symptoms and signs involving the circulatory and respiratory systems: Secondary | ICD-10-CM

## 2018-03-06 LAB — GLUCOSE, CAPILLARY
GLUCOSE-CAPILLARY: 102 mg/dL — AB (ref 65–99)
GLUCOSE-CAPILLARY: 118 mg/dL — AB (ref 65–99)
Glucose-Capillary: 145 mg/dL — ABNORMAL HIGH (ref 65–99)
Glucose-Capillary: 117 mg/dL — ABNORMAL HIGH (ref 65–99)

## 2018-03-06 MED ORDER — LISINOPRIL 5 MG PO TABS
5.0000 mg | ORAL_TABLET | Freq: Two times a day (BID) | ORAL | Status: DC
  Administered 2018-03-06 – 2018-03-07 (×3): 5 mg via ORAL
  Filled 2018-03-06 (×2): qty 1

## 2018-03-06 NOTE — Progress Notes (Signed)
Speech Language Pathology Weekly Progress and Session Note  Patient Details  Name: Charlene Greene MRN: [id number] Date of Birth: 1922/01/14  Beginning of progress report period: February 27, 2018  End of progress report period: March 06, 2018   Today's Date: 03/06/2018 SLP Individual Time: 0905-1001 SLP Individual Time Calculation (min): 56 min  Short Term Goals: Week 1: SLP Short Term Goal 1 (Week 1): Pt will consume therapeutic trials of thin liquids with supervision cues for use of swallowing precautions and minimal overt s/s of aspiration over three consecutive sessions prior to advancement.  SLP Short Term Goal 1 - Progress (Week 1): Met SLP Short Term Goal 2 (Week 1): Pt will consume therapeutic trials of mixed solids and thin liquid consistencies with supervision cues for use of swallowing precautions and minimal overt s/s of aspiration over three consecutive sessions prior to advancement.   SLP Short Term Goal 2 - Progress (Week 1): Met SLP Short Term Goal 3 (Week 1): Pt will selectively attend to tasks in a mildly distracting environment for 30 minutes with supervision verbal cues for redirection to task.  SLP Short Term Goal 3 - Progress (Week 1): Met SLP Short Term Goal 4 (Week 1): Pt will recall daily information with supervision verbal cues for use of external aids.   SLP Short Term Goal 4 - Progress (Week 1): Progressing toward goal SLP Short Term Goal 5 (Week 1): Pt will complete basic, tasks with supervision cues for functional problem solving.   SLP Short Term Goal 5 - Progress (Week 1): Met    New Short Term Goals: Week 2: SLP Short Term Goal 1 (Week 2): Pt will consume regular textures and thin liquids with mod I use of swallowing precautions and no overt s/s of aspiration.  SLP Short Term Goal 2 (Week 2): Pt will selectively attend to tasks in a mildly distracting environment for 45 minutes with mod I redirection to task.  SLP Short Term Goal 3 (Week 2): Pt will recall  daily information with supervision verbal cues for use of external aids.   SLP Short Term Goal 4 (Week 2): Pt will complete mildly complex tasks with supervision cues for functional problem solving.    Weekly Progress Updates:   Pt has made functional gains this reporting period and has met 4 out of 5 short term goals.  Pt is currently min assist-supervision for tasks due to mild cognitive deficits.  Pt has demonstrated improved processing speed and selective attention to tasks.  Pt is consuming regular, thin liquids diet with at most supervision cues for use of swallowing precautions.  Pt and family education is ongoing.  Pt would continue to benefit from skilled ST while inpatient in order to maximize functional independence and reduce burden of care prior to discharge.  Do not anticipate that pt will need ST follow up at next level fo care.     Intensity: Minumum of 1-2 x/day, 30 to 90 minutes Frequency: 3 to 5 out of 7 days Duration/Length of Stay: 14-17 days  Treatment/Interventions: Cognitive remediation/compensation;Cueing hierarchy;Dysphagia/aspiration precaution training;Internal/external aids;Environmental controls;Patient/family education   Daily Session  Skilled Therapeutic Interventions: Pt was seen for skilled ST targeting goals for dysphagia and cognition.  SLP facilitated the session with a trial snack of regular textures and thin liquids to continue working towards diet progression.  Pt consumed snack with distant supervision cues for use of swallowing precautions.  SLP also facilitated the session with a novel card game to address recall of new information.  Pt needed min assist to generate and recall word-picture associations within task.  Pt reports memory being an issue at baseline from previous CVA but that it remains worse from her new baseline.    Pt was returned to room and left in wheelchair with chair alarm set, quick release belt donned, and call bell within reach.   Continue per current plan of care.         Function:   Eating Eating   Modified Consistency Diet: Yes Eating Assist Level: Swallowing techniques: self managed           Cognition Comprehension Comprehension assist level: Follows basic conversation/direction with no assist  Expression   Expression assist level: Expresses basic needs/ideas: With no assist  Social Interaction    Problem Solving Problem solving assist level: Solves basic 90% of the time/requires cueing < 10% of the time  Memory Memory assist level: Recognizes or recalls 75 - 89% of the time/requires cueing 10 - 24% of the time   Therapy/Group: Individual Therapy  Charlene Greene, Charlene Greene 03/06/2018, 12:56 PM

## 2018-03-06 NOTE — Plan of Care (Signed)
  Problem: RH BLADDER ELIMINATION Goal: RH STG MANAGE BLADDER WITH ASSISTANCE Description STG Manage Bladder With Assistance mod assist  Outcome: Progressing  Last BM 03/05/18

## 2018-03-06 NOTE — Progress Notes (Signed)
Occupational Therapy Session Note  Patient Details  Name: Charlene Greene MRN: [id number] Date of Birth: Aug 18, 1922  Today's Date: 03/06/2018 OT Individual Time: [id number] OT Individual Time Calculation (min): 24 min  and Today's Date: 03/06/2018 OT Missed Time: 21 Minutes Missed Time Reason: Patient unwilling/refused to participate without medical reason;Patient fatigue   Short Term Goals: Week 1:  OT Short Term Goal 1 (Week 1): Pt will donn pullover shirt with supervision sitting unsupported.  OT Short Term Goal 2 (Week 1): Pt will complete LB bathing sit to stand with min assist.  OT Short Term Goal 3 (Week 1): Pt will perform LB dressing sit to stand with min assist.  OT Short Term Goal 4 (Week 1): Pt will perform toilet transfer to the 3:1 with RW and supervision.    Skilled Therapeutic Interventions/Progress Updates:    Treatment session with focus on sit > stand and standing balance.  Pt received upright in w/c reporting having family arriving shortly.  Pt willing to engage in therapy session if remained in room.  Engaged in sit > stand x2 with focus on anterior weight shift, requiring mod assist sit > stand with RW.  Pt reporting near concern with knee buckling during standing and ambulation.  Pt's nieces arrived to which pt stated that she had "done enough" therapy, expressing desire to visit with family.  Pt left upright in w/c with quick release belt intact and call bell in reach.  Therapy Documentation Precautions:  Precautions Precautions: Fall Precaution Comments: L hemiparesis Restrictions Weight Bearing Restrictions: No General: General OT Amount of Missed Time: 21 Minutes Vital Signs: Therapy Vitals Temp: 98.1 F (36.7 C) Temp Source: Oral Pulse Rate: 65 Resp: 19 BP: (!) 152/78 Patient Position (if appropriate): Sitting Oxygen Therapy SpO2: 98 % O2 Device: Room Air Pain:  Pt reports chronic pain in BLE, however with no complaints during activity.  See  Function Navigator for Current Functional Status.   Therapy/Group: Individual Therapy  Rosalio LoudHOXIE, Kalyiah Saintil 03/06/2018, 3:21 PM

## 2018-03-06 NOTE — Progress Notes (Signed)
[place] PHYSICAL MEDICINE & REHABILITATION     PROGRESS NOTE  Subjective/Complaints:  Patient seen lying in bed this morning. She states she slept well overnight. She states is the first time ever she slept well.  ROS: denies CP, SOB, nausea, vomiting, diarrhea.  Objective: Vital Signs: Blood pressure (!) 149/86, pulse 63, temperature 97.9 F (36.6 C), temperature source Oral, resp. rate 16, height 5\' 5"  (1.651 m), weight 71.8 kg (158 lb 4.6 oz), SpO2 97 %. Dg Swallowing Func-speech Pathology  Result Date: 03/05/2018 Objective Swallowing Evaluation: Type of Study: MBS-Modified Barium Swallow Study  Patient Details Name: Charlene Greene MRN: [id number] Date of Birth: May 20, 1922 Today's Date: 03/05/2018 Time: SLP Start Time (ACUTE ONLY): 1115 -SLP Stop Time (ACUTE ONLY): 1130 SLP Time Calculation (min) (ACUTE ONLY): 15 min Past Medical History: Past Medical History: Diagnosis Date . CAD (coronary artery disease)  . Diabetes mellitus without complication (HCC)  . Hyperlipidemia  . Hypertension  . Pacemaker  . Renal artery stenosis (HCC)   s/p bilateral stents . Sick sinus syndrome (HCC)  . Stroke [facility](HCC)   2 previous cva's Past Surgical History: Past Surgical History: Procedure Laterality Date . PACEMAKER PLACEMENT  05/15/2014  [facility] HPI: Charlene Greene is a 84 y.o. female with medical history significant of CVA 2017, DM 2, HTN, sick sinus syndrome, sp pacemaker, diastolic CHF presented with left side weakness, slurred speech and facial droop similar to prior presentation of CVA in 2107. Per chart pt reports 1 week of cough sometimes worse with eating. Patient had dysphasia with prior CVA but it resolved and she is on regular diet. CXR No edema or consolidation. Head CT No acute intracranial abnormality. BSE 09/2016 mild oropharyngeal dysphagia, reg/thin recommended.  Subjective: pt pleasant, cooperative Assessment / Plan / Recommendation CHL IP CLINICAL  IMPRESSIONS 03/05/2018 Clinical Impression Pt presents with very mild pharyngeal phase dysphagia c/b delayed swallow initiation to vaculla and occasional delayed to the pyriform sinuses with multiple sips. This results in very faint flash penetration of thin liquids during consecutive cup sips or straw sips. No aspiration observed. Pt also able to protect airway with mixed consistencies and barium tablet whole with thin liquids via straw. Pt also presents with mild residue in pyriform sinus but was always contained. Recommend upgrading to thin liquids via cup or straw and medicine whole. Intermittent supervision. Of note, pt with cough before and after study. This cough was not related to any instances of penetration or residue.  SLP Visit Diagnosis Dysphagia, pharyngeal phase (R13.13) Attention and concentration deficit following -- Frontal lobe and executive function deficit following -- Impact on safety and function Mild aspiration risk   CHL IP TREATMENT RECOMMENDATION 03/05/2018 Treatment Recommendations Therapy as outlined in treatment plan below   Prognosis 02/25/2018 Prognosis for Safe Diet Advancement Good Barriers to Reach Goals -- Barriers/Prognosis Comment -- CHL IP DIET RECOMMENDATION 03/05/2018 SLP Diet Recommendations Dysphagia 3 (Mech soft) solids;Thin liquid Liquid Administration via Cup;Straw Medication Administration Whole meds with liquid Compensations Slow rate;Small sips/bites Postural Changes Seated upright at 90 degrees;Remain semi-upright after after feeds/meals (Comment)   CHL IP OTHER RECOMMENDATIONS 03/05/2018 Recommended Consults -- Oral Care Recommendations Oral care BID Other Recommendations --   CHL IP FOLLOW UP RECOMMENDATIONS 02/25/2018 Follow up Recommendations Skilled Nursing facility   [facility] FREQUENC[facility] AND DURATION 02/25/2018 Speech Therapy Frequency (ACUTE ONLY) min 2x/week Treatment Duration --      CHL IP ORAL PHASE 03/05/2018 Oral Phase WFL Oral -  Pudding Teaspoon -- Oral - Pudding  Cup -- Oral - Honey Teaspoon -- Oral - Honey Cup -- Oral - Nectar Teaspoon -- Oral - Nectar Cup -- Oral - Nectar Straw -- Oral - Thin Teaspoon -- Oral - Thin Cup -- Oral - Thin Straw -- Oral - Puree -- Oral - Mech Soft -- Oral - Regular -- Oral - Multi-Consistency -- Oral - Pill -- Oral Phase - Comment --  CHL IP PHARYNGEAL PHASE 03/05/2018 Pharyngeal Phase Impaired Pharyngeal- Pudding Teaspoon -- Pharyngeal -- Pharyngeal- Pudding Cup -- Pharyngeal -- Pharyngeal- Honey Teaspoon -- Pharyngeal -- Pharyngeal- Honey Cup -- Pharyngeal -- Pharyngeal- Nectar Teaspoon -- Pharyngeal -- Pharyngeal- Nectar Cup NT Pharyngeal -- Pharyngeal- Nectar Straw NT Pharyngeal -- Pharyngeal- Thin Teaspoon Delayed swallow initiation-vallecula;Pharyngeal residue - pyriform Pharyngeal -- Pharyngeal- Thin Cup Delayed swallow initiation-vallecula;Pharyngeal residue - pyriform;Penetration/Aspiration during swallow Pharyngeal Material enters airway, remains ABOVE vocal cords then ejected out Pharyngeal- Thin Straw Delayed swallow initiation-vallecula;Pharyngeal residue - pyriform;Penetration/Aspiration during swallow Pharyngeal Material enters airway, remains ABOVE vocal cords then ejected out Pharyngeal- Puree -- Pharyngeal -- Pharyngeal- Mechanical Soft -- Pharyngeal -- Pharyngeal- Regular WFL Pharyngeal -- Pharyngeal- Multi-consistency Pharyngeal residue - pyriform Pharyngeal -- Pharyngeal- Pill Delayed swallow initiation-vallecula Pharyngeal -- Pharyngeal Comment --  CHL IP CERVICAL ESOPHAGEAL PHASE 03/05/2018 Cervical Esophageal Phase Impaired Pudding Teaspoon -- Pudding Cup -- Honey Teaspoon -- Honey Cup -- Nectar Teaspoon -- Nectar Cup -- Nectar Straw -- Thin Teaspoon -- Thin Cup -- Thin Straw -- Puree -- Mechanical Soft -- Regular -- Multi-consistency -- Pill -- Cervical Esophageal Comment mildly decreased UES relaxation but didn't pose risk with any consistencies on eval No flowsheet data found. Charlene Greene 03/05/2018, 9:47 AM               No results for input(s): WBC, HGB, HCT, PLT in the last 72 hours. No results for input(s): NA, K, CL, GLUCOSE, BUN, CREATININE, CALCIUM in the last 72 hours.  Invalid input(s): CO CBG (last 3)  Recent Labs    03/05/18 1645 03/05/18 2202 03/06/18 0618  GLUCAP 197* 123* 102*    Wt Readings from Last 3 Encounters:  03/04/18 71.8 kg (158 lb 4.6 oz)  02/22/18 75.9 kg (167 lb 5.3 oz)  09/26/16 82.4 kg (181 lb 9.6 oz)    Physical Exam:  BP (!) 149/86 (BP Location: Left Arm)   Pulse 63   Temp 97.9 F (36.6 C) (Oral)   Resp 16   Ht 5\' 5"  (1.651 m)   Wt 71.8 kg (158 lb 4.6 oz)   SpO2 97%   BMI 26.34 kg/m  Constitutional: She appears well-developed and well-nourished.  HENT: Normocephalic and atraumatic.  Eyes: EOM are normal. No discharge.  Cardiovascular: Cardiac rate controlled.  Respiratory: Effort normal and breath sounds normal.  GI: Bowel sounds are normal. She exhibits no distension.  Musculoskeletal: LE edema and tenderness (stable) Neurological: She is alert.  Made good eye contact with examiner.   Fair awareness of deficits.   Follow commands.   Motor: RUE/RLE: 4/5 proximal to distal LUE/LLE: 4/5 proximal to distal (stronger than right, unchanged)  Skin: Skin is warm and dry Psychiatric: She has a normal mood and affect. Her behavior is normal.   Assessment/Plan: 1. Functional deficits secondary to left thalamic left occipital infarcts as well as intracranial stenosis as well as history of CVA 2017 which require 3+ hours per day of interdisciplinary therapy in a comprehensive inpatient rehab setting. Physiatrist is providing close team supervision and 24  hour management of active medical problems listed below. Physiatrist and rehab team continue to assess barriers to discharge/monitor patient progress toward functional and medical goals.  Function:  Bathing Bathing position   Position: Shower  Bathing parts Body parts bathed by patient: Right arm, Left arm,  Chest, Abdomen, Front perineal area, Right upper leg, Left upper leg, Left lower leg, Right lower leg, Buttocks Body parts bathed by helper: Buttocks, Back  Bathing assist Assist Level: Touching or steadying assistance(Pt > 75%)      Upper Body Dressing/Undressing Upper body dressing   What is the patient wearing?: Bra, Pull over shirt/dress Bra - Perfomed by patient: Thread/unthread right bra strap, Thread/unthread left bra strap Bra - Perfomed by helper: Hook/unhook bra (pull down sports bra) Pull over shirt/dress - Perfomed by patient: Thread/unthread right sleeve, Thread/unthread left sleeve, Pull shirt over trunk Pull over shirt/dress - Perfomed by helper: Put head through opening        Upper body assist Assist Level: Touching or steadying assistance(Pt > 75%)      Lower Body Dressing/Undressing Lower body dressing   What is the patient wearing?: Pants, Socks, Shoes, Underwear Underwear - Performed by patient: Thread/unthread right underwear leg Underwear - Performed by helper: Thread/unthread left underwear leg, Pull underwear up/down, Thread/unthread right underwear leg Pants- Performed by patient: Thread/unthread right pants leg, Thread/unthread left pants leg Pants- Performed by helper: Pull pants up/down(pt assisted 50%)   Non-skid slipper socks- Performed by helper: Don/doff right sock, Don/doff left sock Socks - Performed by patient: Don/doff right sock, Don/doff left sock Socks - Performed by helper: Don/doff right sock, Don/doff left sock Shoes - Performed by patient: Don/doff right shoe, Don/doff left shoe Shoes - Performed by helper: Don/doff right shoe, Don/doff left shoe          Lower body assist Assist for lower body dressing: (totalA)      Toileting Toileting   Toileting steps completed by patient: Performs perineal hygiene Toileting steps completed by helper: Adjust clothing prior to toileting, Performs perineal hygiene, Adjust clothing after  toileting Toileting Assistive Devices: Grab bar or rail  Toileting assist Assist level: Touching or steadying assistance (Pt.75%)   Transfers Chair/bed transfer   Chair/bed transfer method: Ambulatory Chair/bed transfer assist level: Touching or steadying assistance (Pt > 75%) Chair/bed transfer assistive device: Walker, Designer, fashion/clothing     Max distance: 25 ft Assist level: Touching or steadying assistance (Pt > 75%)   Wheelchair Wheelchair activity did not occur: Safety/medical concerns   Max wheelchair distance: 45 ft Assist Level: Touching or steadying assistance (Pt > 75%)  Cognition Comprehension Comprehension assist level: Follows basic conversation/direction with extra time/assistive device  Expression Expression assist level: Expresses basic needs/ideas: With no assist  Social Interaction Social Interaction assist level: Interacts appropriately 90% of the time - Needs monitoring or encouragement for participation or interaction.  Problem Solving Problem solving assist level: Solves basic 90% of the time/requires cueing < 10% of the time  Memory Memory assist level: Recognizes or recalls 90% of the time/requires cueing < 10% of the time    Medical Problem List and Plan: 1.  Gait disorder with dysphagia secondary to left thalamic left occipital infarcts as well as intracranial stenosis on 4/13 as well as history of CVA 2017  Cont CIR  2.  DVT Prophylaxis/Anticoagulation: SCDs.     Vascular study neg for DVT, however, suggestive of Baker's cyst, with edema and femoral artery calcification 3. Pain Management: Tylenol  as needed 4. Mood: Provide emotional support 5. Neuropsych: This patient is ?fully capable of making decisions on her own behalf. 6. Skin/Wound Care: Routine skin checks 7. Fluids/Electrolytes/Nutrition: Routine I/O's    BMP within acceptable range on 4/18  Labs ordered for tomorrow 8.  Dysphagia.  Dysphasia #3 nectar liquids.  Follow-up  speech therapy   Advance diet as tolerated 9.  Diastolic congestive heart failure.  Monitor for any signs of fluid overload Filed Weights   02/26/18 1846 03/04/18 0435  Weight: 76.1 kg (167 lb 12.3 oz) 71.8 kg (158 lb 4.6 oz)   10.  Sick sinus syndrome status post pacemaker.  Follow-up per cardiology services 11.  Diabetes mellitus peripheral neuropathy.  Hemoglobin A1c 6.9.  SSI.  Check blood sugars before meals and at bedtime.    Patient on Amaryl 2 mg daily prior to admission.  Resume as needed   Amaryl resumed on 4/20 CBG (last 3)  Recent Labs    03/05/18 1645 03/05/18 2202 03/06/18 0618  GLUCAP 197* 123* 102*   Slightly labile on 4/25 12.  Hypertension.  Toprol-XL 100 mg daily, Norvasc 5 mg twice daily.      Vitals:   03/05/18 2150 03/06/18 0521  BP: (!) 153/63 (!) 149/86  Pulse: 60 63  Resp:  16  Temp: 97.9 F (36.6 C) 97.9 F (36.6 C)  SpO2: 98% 97%   Lisinopril 5 started on 4/23, increased to twice a day on 4/25 13.  Hyperlipidemia.  Zetia 14. Hypoalbuminemia  Supplement initiated on 4/15  15. PAD -has fairly consistent pain in BLE with partial relief using tylenol, fem art calcification likely chronic  Patient now amenable to further work-up, await vascular recs   LOS (Days) 8 A FACE TO FACE EVALUATION WAS PERFORMED  Delvecchio Madole Karis Juba 03/06/2018 8:49 AM

## 2018-03-06 NOTE — Progress Notes (Signed)
Physical Therapy Weekly Progress Note  Patient Details  Name: Charlene Greene MRN: [id number] Date of Birth: September 24, 1922  Beginning of progress report period: February 27, 2018 End of progress report period: March 06, 2018  Today's Date: 03/06/2018 PT Individual Time: 1300-1400 PT Individual Time Calculation (min): 60 min   Patient has met 3 of 3 short term goals.  Patient currently requires S/minA for bed mobility, min/modA for sit <>stand, min guard for stand pivot transfer and gait short distances 20-25' with RW. Pt has demonstrated 2-3 episodes of new onset RLE buckling; trialing ground reaction AFO to improve proprioceptive input and knee control in stance. Patient fluctuates in level of assist depending on fatigue and motivation.  Patient continues to demonstrate the following deficits muscle weakness and muscle joint tightness, decreased cardiorespiratoy endurance, impaired timing and sequencing, unbalanced muscle activation and decreased coordination and decreased sitting balance, decreased standing balance, decreased postural control, hemiplegia and decreased balance strategies and therefore will continue to benefit from skilled PT intervention to increase functional independence with mobility.  Patient progressing toward long term goals..  Continue plan of care.  PT Short Term Goals Week 1:  PT Short Term Goal 1 (Week 1): Pt will perform bed mobility minA from hospital bed PT Short Term Goal 1 - Progress (Week 1): Met PT Short Term Goal 2 (Week 1): Pt will perform stand pivot transfer minA PT Short Term Goal 2 - Progress (Week 1): Met PT Short Term Goal 3 (Week 1): Pt will ambulate x25' minA PT Short Term Goal 3 - Progress (Week 1): Met Week 2:  PT Short Term Goal 1 (Week 2): =LTG due to estimated LOS  Skilled Therapeutic Interventions/Progress Updates: Pt received with handoff from RN/NT after brief change; denies pain and agreeable to treatment. Transported totalA to gym. Sit >stand  modA from w/c, min guard for pivotal steps to mat table. Sit <>stand training from elevated mat progressively lowered to lowest height with pt able to perform with S>min guard at all heights. Returned to w/c with stand pivot min guard. Attempted sit >stand from w/c seat however requires modA x3 trials. Initiated gait with LLE however RLE reports "stuck" and unable to progress despite multimodal cueing and weight shifting provided by therapist. Stand pivot transfer w/c>nustep; modA to boost to stand and modA to initiate RLE progression d/t LE again being "stuck"; suspect difficulty with unlocking knee d/t GRAFO facilitating knee flexion. Performed BUE/BLE nustep x8 min on level 4 for strengthening and endurance. Stand pivot to w/c with RW and no AFO with improved RLE movement and initiation. Remained seated in w/c at end of session, quick release belt and chair alarm intact, all needs in reach.      Therapy Documentation Precautions:  Precautions Precautions: Fall Precaution Comments: L hemiparesis Restrictions Weight Bearing Restrictions: No   See Function Navigator for Current Functional Status.  Therapy/Group: Individual Therapy  Luberta Mutter 03/06/2018, 1:53 PM

## 2018-03-06 NOTE — Progress Notes (Signed)
Occupational Therapy Session Note  Patient Details  Name: Charlene Greene MRN: [id number] Date of Birth: 07-15-1922  Today's Date: 03/06/2018 OT Individual Time: 1130-1200 OT Individual Time Calculation (min): 30 min    Short Term Goals: Week 1:  OT Short Term Goal 1 (Week 1): Pt will donn pullover shirt with supervision sitting unsupported.  OT Short Term Goal 2 (Week 1): Pt will complete LB bathing sit to stand with min assist.  OT Short Term Goal 3 (Week 1): Pt will perform LB dressing sit to stand with min assist.  OT Short Term Goal 4 (Week 1): Pt will perform toilet transfer to the 3:1 with RW and supervision.    Skilled Therapeutic Interventions/Progress Updates:    Pt seen for OT session focusing on functional transfers and ADL re-training. Pt sitting on BSC upon arrival, reports having finished toileting task. She was able to stand from elevated BSC with min A to RW. She performed hygiene following [facility]ting task [facility] assist, assist provided for thoroughness following BM.  She completed max A stand pivot transfer to w/c, unable to advance R LE despite assist for weight shift and cuing. She dressed seated in w/c, requires encouragement for independence with basic ADLs. [facility] to stand from lower surface and able to assist in pulling up of pants, assist required by therapist to advance completely over hips. Pt left seated in w/c at end of session with QRB and chair alarm on, all needs in reach.   Therapy Documentation Precautions:  Precautions Precautions: Fall Precaution Comments: L hemiparesis Restrictions Weight Bearing Restrictions: No Pain:   Reports chronic B LE pain, able to be redirected and repositioned for comfort.   See Function Navigator for Current Functional Status.   Therapy/Group: Individual Therapy  [name] 03/06/2018, 7:13 AM

## 2018-03-07 ENCOUNTER — Inpatient Hospital Stay (HOSPITAL_COMMUNITY): Payer: Medicare Other

## 2018-03-07 ENCOUNTER — Inpatient Hospital Stay (HOSPITAL_COMMUNITY): Payer: Medicare Other | Admitting: Physical Therapy

## 2018-03-07 ENCOUNTER — Inpatient Hospital Stay (HOSPITAL_COMMUNITY): Payer: Medicare Other | Admitting: Occupational Therapy

## 2018-03-07 ENCOUNTER — Ambulatory Visit (HOSPITAL_COMMUNITY): Payer: Medicare Other | Admitting: Speech Pathology

## 2018-03-07 DIAGNOSIS — R799 Abnormal finding of blood chemistry, unspecified: Secondary | ICD-10-CM

## 2018-03-07 LAB — BASIC METABOLIC PANEL
Anion gap: 10 (ref 5–15)
BUN: 23 mg/dL — AB (ref 6–20)
CHLORIDE: 104 mmol/L (ref 101–111)
CO2: 26 mmol/L (ref 22–32)
CREATININE: 0.67 mg/dL (ref 0.44–1.00)
Calcium: 8.9 mg/dL (ref 8.9–10.3)
GFR calc non Af Amer: >60 mL/min (ref >60)
Glucose, Bld: 123 mg/dL — ABNORMAL HIGH (ref 65–99)
Potassium: 3.8 mmol/L (ref 3.5–5.1)
Sodium: 140 mmol/L (ref 135–145)

## 2018-03-07 LAB — CBC WITH DIFFERENTIAL/PLATELET
Basophils Absolute: 0 10*3/uL (ref 0.0–0.1)
Basophils Relative: 0 %
EOS ABS: 0.2 10*3/uL (ref 0.0–0.7)
Eosinophils Relative: 3 %
HCT: 40.4 % (ref 36.0–46.0)
HEMOGLOBIN: 13.1 g/dL (ref 12.0–15.0)
LYMPHS ABS: 1.5 10*3/uL (ref 0.7–4.0)
LYMPHS PCT: 27 %
MCH: 28.9 pg (ref 26.0–34.0)
MCHC: 32.4 g/dL (ref 30.0–36.0)
MCV: 89 fL (ref 78.0–100.0)
MONOS PCT: 5 %
Monocytes Absolute: 0.3 10*3/uL (ref 0.1–1.0)
Neutro Abs: 3.7 10*3/uL (ref 1.7–7.7)
Neutrophils Relative %: 65 %
Platelets: 210 10*3/uL (ref 150–400)
RBC: 4.54 MIL/uL (ref 3.87–5.11)
RDW: 13.7 % (ref 11.5–15.5)
WBC: 5.7 10*3/uL (ref 4.0–10.5)

## 2018-03-07 LAB — GLUCOSE, CAPILLARY
GLUCOSE-CAPILLARY: 114 mg/dL — AB (ref 65–99)
GLUCOSE-CAPILLARY: 126 mg/dL — AB (ref 65–99)
Glucose-Capillary: 97 mg/dL (ref 65–99)
Glucose-Capillary: 130 mg/dL — ABNORMAL HIGH (ref 65–99)

## 2018-03-07 MED ORDER — LISINOPRIL 10 MG PO TABS
10.0000 mg | ORAL_TABLET | Freq: Two times a day (BID) | ORAL | Status: DC
  Administered 2018-03-07 – 2018-03-10 (×6): 10 mg via ORAL
  Filled 2018-03-07 (×6): qty 1

## 2018-03-07 MED ORDER — LISINOPRIL 5 MG PO TABS
5.0000 mg | ORAL_TABLET | Freq: Once | ORAL | Status: AC
  Administered 2018-03-07: 5 mg via ORAL
  Filled 2018-03-07: qty 1

## 2018-03-07 MED ORDER — TRAZODONE HCL 50 MG PO TABS
25.0000 mg | ORAL_TABLET | Freq: Every evening | ORAL | Status: DC | PRN
  Administered 2018-03-07: 25 mg via ORAL
  Filled 2018-03-07: qty 1

## 2018-03-07 NOTE — Progress Notes (Signed)
Pt complained of foot pain in right foot. Upon assessment I noticed her foot was little red and tender to the touch. Provider notified and x-ray was ordered. Will continue to monitor pt.

## 2018-03-07 NOTE — Plan of Care (Signed)
  Problem: Consults Goal: Nutrition Consult-if indicated Outcome: Progressing Goal: Diabetes Guidelines if Diabetic/Glucose > 140 Description If diabetic or lab glucose is > 140 mg/dl - Initiate Diabetes/Hyperglycemia Guidelines & Document Interventions  Outcome: Progressing   Problem: RH BOWEL ELIMINATION Goal: RH STG MANAGE BOWEL WITH ASSISTANCE Description STG Manage Bowel with Assistance. Moderate assist  Outcome: Progressing   Problem: RH BLADDER ELIMINATION Goal: RH STG MANAGE BLADDER WITH ASSISTANCE Description STG Manage Bladder With Assistance mod assist  Outcome: Progressing   Problem: RH SKIN INTEGRITY Goal: RH STG MAINTAIN SKIN INTEGRITY WITH ASSISTANCE Description STG Maintain Skin Integrity With Assistance.Minimal assist  Outcome: Progressing   Problem: RH SAFETY Goal: RH STG ADHERE TO SAFETY PRECAUTIONS W/ASSISTANCE/DEVICE Description STG Adhere to Safety Precautions With Assistance/Device.with mod assist  Outcome: Progressing Goal: RH STG DECREASED RISK OF FALL WITH ASSISTANCE Description STG Decreased Risk of Fall With Assistance.with mod assist  Outcome: Progressing   Problem: RH KNOWLEDGE DEFICIT GENERAL Goal: RH STG INCREASE KNOWLEDGE OF SELF CARE AFTER HOSPITALIZATION Description Pt will be able to guide self care with min assist of caregivers  Outcome: Progressing

## 2018-03-07 NOTE — Progress Notes (Signed)
Occupational Therapy Weekly Progress Note  Patient Details  Name: Charlene Greene MRN: [id number] Date of Birth: 12-15-1921  Beginning of progress report period: February 27, 2018 End of progress report period: March 07, 2018  Today's Date: 03/07/2018 OT Individual Time: 6433-2951 OT Individual Time Calculation (min): 57 min    Patient has met 0 of 4 short term goals.  Ms. Weiand continues to make steady but slow progress with OT.  She needs overall mod assist for dressing tasks, both UB and LB with min to mod assist for bathing sit to stand.  She demonstrates variable levels of assist needed with toilet transfers and shower transfers, ranging from max to min assist.  With transfers she continues at times to exhibit RLE knee buckling as well as decreased ability to advance the RLE.  This is not consistent but when she does have trouble advancing it, she tends to fall more forward in her trunk without correction and thus needs to sit down immediately.  She also continues to report pain in her LEs as well, which tends to increase with functional mobility.  Prior to admission family reports that pt needed overall min assist or more with selfcare tasks and toileting.  At this point feel with steady progress she can reach min assist level for expected discharge next week on 5/2.  Do anticipate that pt will need 24 hour supervision/assist and that she will not be able to transfer to the bathroom at night by herself.    Patient continues to demonstrate the following deficits: muscle weakness, impaired timing and sequencing, unbalanced muscle activation and decreased coordination and decreased standing balance, decreased postural control, hemiplegia and decreased balance strategies and therefore will continue to benefit from skilled OT intervention to enhance overall performance with BADL.  Patient progressing toward long term goals..  Continue plan of care.  OT Short Term Goals Week 2:  OT Short Term Goal 1  (Week 2): Continue working on established LTGs set at supervision to min assist level.   Skilled Therapeutic Interventions/Progress Updates:    Pt completed shower and dressing during session.  Mod assist for stand pivot transfer from the wheelchair to the tub bench using the RW.  Mod demonstrational cueing to step forward with the RLE first when ambulating a few steps to the shower bench, secondary to demonstrating decreased ability to advance the LE at times.  Bathing overall min assist sit to stand with use of the grab bars to assist with sit to stand and standing balance.  She then transferred to the wheelchair and out to the sink for dressing.  She was able to thread LB clothing with setup but needed mod assist for pulling pants over hips.  Mod assist for threading the stocking on her right foot but she could donn the shoe with supervision.  Min assist was also needed for donning the left shoe as she could not get the heel in.  Finished session with pt in the wheelchair with call button and phone in reach.  Safety belt and chair alarm in place.    Therapy Documentation Precautions:  Precautions Precautions: Fall Precaution Comments: L hemiparesis Restrictions Weight Bearing Restrictions: No General:   Vital Signs:  Pain: Pain Assessment Pain Scale: Faces Faces Pain Scale: Hurts a little bit Pain Type: Chronic pain Pain Location: Leg Pain Orientation: Right;Left Pain Onset: On-going Pain Intervention(s): Repositioned;Emotional support See Function Navigator for Current Functional Status.   Therapy/Group: Individual Therapy  Jasemine Nawaz OTR/L 03/07/2018, 11:58 AM

## 2018-03-07 NOTE — Progress Notes (Signed)
Speech Language Pathology Daily Session Note  Patient Details  Name: Susa Griffinsell Ernest MRN: [id number] Date of Birth: 02/05/1922  Today's Date: 03/07/2018 SLP Individual Time: 1205-1230 SLP Individual Time Calculation (min): 25 min  Short Term Goals: Week 2: SLP Short Term Goal 1 (Week 2): Pt will consume regular textures and thin liquids with mod I use of swallowing precautions and no overt s/s of aspiration.  SLP Short Term Goal 2 (Week 2): Pt will selectively attend to tasks in a mildly distracting environment for 45 minutes with mod I redirection to task.  SLP Short Term Goal 3 (Week 2): Pt will recall daily information with supervision verbal cues for use of external aids.   SLP Short Term Goal 4 (Week 2): Pt will complete mildly complex tasks with supervision cues for functional problem solving.    Skilled Therapeutic Interventions:  Pt was seen for skilled ST targeting dysphagia goals.  SLP facilitated the session with skilled observations completed during presentations of pt's currently prescribed diet.  Pt consumed regular textures and thin liquids with mod I use of swallowing precautions and no overt s/s of aspiration.  Pt continues to report diminished appetite but otherwise good toleration of upgrade.  Pt was left in wheelchair with call bell within reach and quick release belt donned.  Continue per current plan of care.    Function:  Eating Eating   Modified Consistency Diet: No Eating Assist Level: Swallowing techniques: self managed           Cognition Comprehension Comprehension assist level: Follows complex conversation/direction with extra time/assistive device  Expression   Expression assist level: Expresses complex 90% of the time/cues < 10% of the time  Social Interaction Social Interaction assist level: Interacts appropriately with others with medication or extra time (anti-anxiety, antidepressant).  Problem Solving Problem solving assist level: Solves basic 90% of  the time/requires cueing < 10% of the time  Memory Memory assist level: Recognizes or recalls 75 - 89% of the time/requires cueing 10 - 24% of the time    Pain Pain Assessment Pain Scale: 0-10 Pain Score: 0-No pain   Therapy/Group: Individual Therapy  Rocko Fesperman, Melanee SpryNicole L 03/07/2018, 1:50 PM

## 2018-03-07 NOTE — Progress Notes (Signed)
Physical Therapy Session Note  Patient Details  Name: Charlene Greene MRN: [id number] Date of Birth: 1922/09/25  Today's Date: 03/07/2018 PT Individual Time: 0900-1000 PT Individual Time Calculation (min): 60 min   Short Term Goals: Week 2:  PT Short Term Goal 1 (Week 2): =LTG due to estimated LOS  Skilled Therapeutic Interventions/Progress Updates:    Pt supine in bed, agreeable to PT. Pt reports pain in R foot, not rated. Supine to sit with min assist for trunk control. Sit stand with min assist to RW. Standing balance with min assist while changing brief and pericare performed dependently due to incontinence of urine. Stand pivot transfer bed to w/c with RW and min assist. Ambulation 2 x 25 ft with RW and min assist for balance, trial with no GRAFO. Pt has one instance of R knee buckling with each bout of ambulation but is able to maintain balance with min assist and RW. Pt requires max cues and increased time to turn to sit safely in chair after ambulation. Sit to stand mod assist from RW due to low height. Sit to stand x 5 reps from progressively lower mat with min assist fading to mod assist as mat lowers. Commode transfer with mod assist, pt requires assist for 2/3 toileting steps, is able to complete pericare. Pt left seated in w/c in room with needs in reach, chair alarm and quick release belt in place.  Therapy Documentation Precautions:  Precautions Precautions: Fall Precaution Comments: L hemiparesis Restrictions Weight Bearing Restrictions: No  See Function Navigator for Current Functional Status.   Therapy/Group: Individual Therapy  Peter Congoaylor Bernard Slayden, PT, DPT  03/07/2018, 12:02 PM

## 2018-03-07 NOTE — Progress Notes (Signed)
Occupational Therapy Session Note  Patient Details  Name: Charlene Greene MRN: [id number] Date of Birth: 1922/03/04  Today's Date: 03/07/2018 OT Individual Time: 1330-1415 OT Individual Time Calculation (min): 45 min    Short Term Goals: Week 2:  OT Short Term Goal 1 (Week 2): Continue working on established LTGs set at supervision to min assist level.   Skilled Therapeutic Interventions/Progress Updates:    Treatment session focused on ADLs/self care training, transfer training, and pt education on safety awareness and energy conservation techniques. Upon entering room, pt up in chair and agreeable to OT. Pt transferred from w/c to walker with mod A to walk to bathroom for toileting task. Therapist provided verbal cues for safe walker use in room for hand/foot placement. Pt completed toileting task with mod A for tx with v/c for hand placement. Pt required max A for clothing management. Pt completed grooming at sink at w/c level with S. Pt required additional time at sink for tasks. Pt requested to use telephone and able to dial numbers from her paper. Pt left resting in w/c in room with needs met and chair alarm engaged. No c/o pain.   Therapy Documentation Precautions:  Precautions Precautions: Fall Precaution Comments: L hemiparesis Restrictions Weight Bearing Restrictions: No   Pain: Pain Assessment Pain Scale: 0-10 Pain Score: 0-No pain Faces Pain Scale: Hurts a little bit Pain Type: Chronic pain Pain Location: Leg Pain Orientation: Right;Left Pain Onset: On-going Pain Intervention(s): Repositioned;Emotional support  See Function Navigator for Current Functional Status.   Therapy/Group: Individual Therapy  Delon Sacramento 03/07/2018, 3:49 PM

## 2018-03-07 NOTE — NC FL2 (Signed)
[place] MEDICAID FL2 LEVEL OF CARE SCREENING TOOL     IDENTIFICATION  Patient Name: Charlene Greene Birthdate: 02-04-1922 Sex: female Admission Date (Current Location): 02/26/2018  [facility] [place]   Facility and Address:  [facility], [address], [place], [place] [address]      Provider Number: [id number]  Attending Physician Name and Address:  Marcello FennelPatel, Ankit Anil, MD  Relative Name and Phone Number:  Nemiah CommanderRon Aldrigde-son [phone]-cell    Current Level of Care: Other (Comment)(rehab) Recommended Level of Care: Assisted Living Facility Prior Approval Number:    Date Approved/Denied:   PASRR Number:    Discharge Plan: Other (Comment)(ALF)    Current Diagnoses: Patient Active Problem List   Diagnosis Date Noted  . Elevated BUN   . Labile blood pressure   . Essential hypertension   . Dysphasia, post-stroke   . PAD (peripheral artery disease) (HCC)   . Hypoalbuminemia due to protein-calorie malnutrition (HCC)   . Diabetic peripheral neuropathy (HCC)   . Diabetes mellitus type 2 in nonobese (HCC)   . Chronic diastolic congestive heart failure (HCC)   . Left thalamic infarction (HCC) 02/26/2018  . Dysphagia, post-stroke   . Acute on chronic diastolic congestive heart failure (HCC)   . Type 2 diabetes mellitus with peripheral neuropathy (HCC)   . TIA (transient ischemic attack) 02/22/2018  . Labile blood glucose   . Hypotension due to drugs   . Hyponatremia   . Renovascular hypertension   . Hypertensive crisis   . Type 2 diabetes mellitus with diabetic autonomic neuropathy, without long-term current use of insulin (HCC)   . Stage 3 chronic kidney disease (HCC)   . Acute lower UTI   . Acute renal insufficiency 09/27/2016  . Urinary urgency 09/27/2016  . Basal ganglia infarction (HCC) 09/26/2016  . Neuropathic pain   . Benign essential HTN   . Slow transit constipation   . Elevated creatine kinase   . Accelerated  hypertension 09/24/2016  . Diabetes mellitus (HCC) 09/24/2016  . Hyperlipidemia 09/24/2016  . Sick sinus syndrome (HCC) 09/24/2016  . CVA (cerebral vascular accident) (HCC) 09/23/2016  . Stroke (cerebrum) (HCC) 09/23/2016    Orientation RESPIRATION BLADDER Height & Weight     Self, Time, Place, Situation  Normal Continent(wears brief) Weight: 158 lb 4.6 oz (71.8 kg) Height:  5\' 5"  (165.1 cm)  BEHAVIORAL SYMPTOMS/MOOD NEUROLOGICAL BOWEL NUTRITION STATUS      Continent Diet(Dys 3 thin liquids)  AMBULATORY STATUS COMMUNICATION OF NEEDS Skin   Supervision   Normal                       Personal Care Assistance Level of Assistance  Bathing, Dressing Bathing Assistance: Limited assistance   Dressing Assistance: Limited assistance     Functional Limitations Info  Sight          SPECIAL CARE FACTORS FREQUENCY  PT (By licensed PT), OT (By licensed OT)     PT Frequency: 3x week OT Frequency: 3x week            Contractures Contractures Info: Not present    Additional Factors Info  Code Status, Allergies Code Status Info: DNR Allergies Info: Statins, other, Amlodipine, Celecoxib, Naproxen, Piroxicam           Current Medications (03/07/2018):  This is the current hospital active medication list Current Facility-Administered Medications  Medication Dose Route Frequency Provider Last Rate Last Dose  . acetaminophen (TYLENOL)  tablet 650 mg  650 mg Oral Q4H PRN Charlton Amor, PA-C   650 mg at 03/05/18 2331   Or  . acetaminophen (TYLENOL) solution 650 mg  650 mg Per Tube Q4H PRN Angiulli, Mcarthur Rossetti, PA-C       Or  . acetaminophen (TYLENOL) suppository 650 mg  650 mg Rectal Q4H PRN Angiulli, Mcarthur Rossetti, PA-C      . albuterol (PROVENTIL) (2.5 MG/3ML) 0.083% nebulizer solution 2.5 mg  2.5 mg Nebulization Q4H PRN Angiulli, Mcarthur Rossetti, PA-C      . amLODipine (NORVASC) tablet 5 mg  5 mg Oral BID AngiulliMcarthur Rossetti, PA-C   5 mg at 03/07/18 1610  . aspirin EC tablet 81 mg   81 mg Oral Daily Charlton Amor, PA-C   81 mg at 03/07/18 9604  . clopidogrel (PLAVIX) tablet 75 mg  75 mg Oral Daily Charlton Amor, PA-C   75 mg at 03/07/18 5409  . ezetimibe (ZETIA) tablet 10 mg  10 mg Oral Daily Charlton Amor, PA-C   10 mg at 03/07/18 8119  . feeding supplement (PRO-STAT SUGAR FREE 64) liquid 30 mL  30 mL Oral BID Marcello Fennel, MD   30 mL at 03/07/18 1478  . glimepiride (AMARYL) tablet 1 mg  1 mg Oral Q breakfast Kirsteins, Victorino Sparrow, MD   1 mg at 03/07/18 0837  . guaiFENesin (MUCINEX) 12 hr tablet 600 mg  600 mg Oral BID Charlton Amor, PA-C   600 mg at 03/07/18 2956  . insulin aspart (novoLOG) injection 0-5 Units  0-5 Units Subcutaneous QHS Erick Colace, MD   2 Units at 03/02/18 2150  . insulin aspart (novoLOG) injection 0-9 Units  0-9 Units Subcutaneous TID WC Kirsteins, Victorino Sparrow, MD   2 Units at 03/06/18 1214  . lisinopril (PRINIVIL,ZESTRIL) tablet 10 mg  10 mg Oral BID Marcello Fennel, MD      . lisinopril (PRINIVIL,ZESTRIL) tablet 5 mg  5 mg Oral Once Marcello Fennel, MD      . metoprolol succinate (TOPROL-XL) 24 hr tablet 100 mg  100 mg Oral Daily Charlton Amor, PA-C   100 mg at 03/07/18 2130  . ondansetron (ZOFRAN) tablet 4 mg  4 mg Oral Q6H PRN Angiulli, Mcarthur Rossetti, PA-C       Or  . ondansetron [facility]) injection 4 mg  4 mg Intravenous Q6H PRN Angiulli, Mcarthur Rossetti, PA-C      . pantoprazole (PROTONIX) EC tablet 20 mg  20 mg Oral Daily Marcello Fennel, MD   20 mg at 03/07/18 0844  . RESOURCE THICKENUP CLEAR   Oral PRN Angiulli, Mcarthur Rossetti, PA-C      . senna-docusate (Senokot-S) tablet 1 tablet  1 tablet Oral QHS PRN Angiulli, Mcarthur Rossetti, PA-C      . sorbitol 70 % solution 30 mL  30 mL Oral Daily PRN Angiulli, Mcarthur Rossetti, PA-C         Discharge Medications: Please see discharge summary for a list of discharge medications.  Relevant Imaging Results:  Relevant Lab Results:   Additional Information SSN: [id number]  Bhavana Kady, Lemar Livings,  LCSW

## 2018-03-07 NOTE — Progress Notes (Signed)
Per nurse--patient complaining of right foot pain that is new and patient reports that staff member stepped on it. Chart reviewed and note that patient with chronic BLE pain and right hemiparesis. Will check films to rule fracture. Ace wrap right foot when up--question sprain due to weakness and instability. Ice qid.  Needs to have shoes on when up.

## 2018-03-07 NOTE — Progress Notes (Signed)
[place] PHYSICAL MEDICINE & REHABILITATION     PROGRESS NOTE  Subjective/Complaints:  Patient seen lying in bed this morning. She states that she had a "terrible night" she then states that she has not slept since she's been in rehabilitation however yesterday she told me that she had the best night of sleep that she has had a long time.  ROS: denies CP, SOB, nausea, vomiting, diarrhea.  Objective: Vital Signs: Blood pressure (!) 185/70, pulse 61, temperature 97.6 F (36.4 C), temperature source Oral, resp. rate 17, height 5\' 5"  (1.651 m), weight 71.8 kg (158 lb 4.6 oz), SpO2 98 %. Dg Swallowing Func-speech Pathology  Result Date: 03/05/2018 Objective Swallowing Evaluation: Type of Study: MBS-Modified Barium Swallow Study  Patient Details Name: Charlene Greene MRN: [id number] Date of Birth: [birth year] Today's Date: 03/05/2018 Time: SLP Start Time (ACUTE ONLY): 1115 -SLP Stop Time (ACUTE ONLY): 1130 SLP Time Calculation (min) (ACUTE ONLY): 15 min Past Medical History: Past Medical History: Diagnosis Date . CAD (coronary artery disease)  . Diabetes mellitus without complication (HCC)  . Hyperlipidemia  . Hypertension  . Pacemaker  . Renal artery stenosis (HCC)   s/p bilateral stents . Sick sinus syndrome (HCC)  . Stroke [facility])   2 previous cva's Past Surgical History: Past Surgical History: Procedure Laterality Date . PACEMAKER PLACEMENT  05/15/2014  MDT Advisa MRI implanted at [facility] for sick sinus syndrome HPI: Charlene Greene is a 84 y.o. female with medical history significant of CVA 2017, DM 2, HTN, sick sinus syndrome, sp pacemaker, diastolic CHF presented with left side weakness, slurred speech and facial droop similar to prior presentation of CVA in 2107. Per chart pt reports 1 week of cough sometimes worse with eating. Patient had dysphasia with prior CVA but it resolved and she is on regular diet. CXR No edema or consolidation. Head CT No acute intracranial abnormality. BSE 09/2016 mild oropharyngeal  dysphagia, reg/thin recommended.  Subjective: pt pleasant, cooperative Assessment / Plan / Recommendation CHL IP CLINICAL IMPRESSIONS 03/05/2018 Clinical Impression Pt presents with very mild pharyngeal phase dysphagia c/b delayed swallow initiation to vaculla and occasional delayed to the pyriform sinuses with multiple sips. This results in very faint flash penetration of thin liquids during consecutive cup sips or straw sips. No aspiration observed. Pt also able to protect airway with mixed consistencies and barium tablet whole with thin liquids via straw. Pt also presents with mild residue in pyriform sinus but was always contained. Recommend upgrading to thin liquids via cup or straw and medicine whole. Intermittent supervision. Of note, pt with cough before and after study. This cough was not related to any instances of penetration or residue.  SLP Visit Diagnosis Dysphagia, pharyngeal phase (R13.13) Attention and concentration deficit following -- Frontal lobe and executive function deficit following -- Impact on safety and function Mild aspiration risk   CHL IP TREATMENT RECOMMENDATION 03/05/2018 Treatment Recommendations Therapy as outlined in treatment plan below   Prognosis 02/25/2018 Prognosis for Safe Diet Advancement Good Barriers to Reach Goals -- Barriers/Prognosis Comment -- CHL IP DIET RECOMMENDATION 03/05/2018 SLP Diet Recommendations Dysphagia 3 (Mech soft) solids;Thin liquid Liquid Administration via Cup;Straw Medication Administration Whole meds with liquid Compensations Slow rate;Small sips/bites Postural Changes Seated upright at 90 degrees;Remain semi-upright after after feeds/meals (Comment)   CHL IP OTHER RECOMMENDATIONS 03/05/2018 Recommended Consults -- Oral Care Recommendations Oral care BID Other Recommendations --   CHL IP FOLLOW UP RECOMMENDATIONS 02/25/2018 Follow up Recommendations Skilled Nursing facility   CHL IP FREQUENCY AND DURATION  02/25/2018 Speech Therapy Frequency (ACUTE ONLY) min  2x/week Treatment Duration --      CHL IP ORAL PHASE 03/05/2018 Oral Phase WFL Oral - Pudding Teaspoon -- Oral - Pudding Cup -- Oral - Honey Teaspoon -- Oral - Honey Cup -- Oral - Nectar Teaspoon -- Oral - Nectar Cup -- Oral - Nectar Straw -- Oral - Thin Teaspoon -- Oral - Thin Cup -- Oral - Thin Straw -- Oral - Puree -- Oral - Mech Soft -- Oral - Regular -- Oral - Multi-Consistency -- Oral - Pill -- Oral Phase - Comment --  CHL IP PHARYNGEAL PHASE 03/05/2018 Pharyngeal Phase Impaired Pharyngeal- Pudding Teaspoon -- Pharyngeal -- Pharyngeal- Pudding Cup -- Pharyngeal -- Pharyngeal- Honey Teaspoon -- Pharyngeal -- Pharyngeal- Honey Cup -- Pharyngeal -- Pharyngeal- Nectar Teaspoon -- Pharyngeal -- Pharyngeal- Nectar Cup NT Pharyngeal -- Pharyngeal- Nectar Straw NT Pharyngeal -- Pharyngeal- Thin Teaspoon Delayed swallow initiation-vallecula;Pharyngeal residue - pyriform Pharyngeal -- Pharyngeal- Thin Cup Delayed swallow initiation-vallecula;Pharyngeal residue - pyriform;Penetration/Aspiration during swallow Pharyngeal Material enters airway, remains ABOVE vocal cords then ejected out Pharyngeal- Thin Straw Delayed swallow initiation-vallecula;Pharyngeal residue - pyriform;Penetration/Aspiration during swallow Pharyngeal Material enters airway, remains ABOVE vocal cords then ejected out Pharyngeal- Puree -- Pharyngeal -- Pharyngeal- Mechanical Soft -- Pharyngeal -- Pharyngeal- Regular WFL Pharyngeal -- Pharyngeal- Multi-consistency Pharyngeal residue - pyriform Pharyngeal -- Pharyngeal- Pill Delayed swallow initiation-vallecula Pharyngeal -- Pharyngeal Comment --  CHL IP CERVICAL ESOPHAGEAL PHASE 03/05/2018 Cervical Esophageal Phase Impaired Pudding Teaspoon -- Pudding Cup -- Honey Teaspoon -- Honey Cup -- Nectar Teaspoon -- Nectar Cup -- Nectar Straw -- Thin Teaspoon -- Thin Cup -- Thin Straw -- Puree -- Mechanical Soft -- Regular -- Multi-consistency -- Pill -- Cervical Esophageal Comment mildly decreased UES relaxation  but didn't pose risk with any consistencies on eval No flowsheet data found. Charlene Greene 03/05/2018, 9:47 AM              Recent Labs    03/07/18 0727  WBC 5.7  HGB 13.1  HCT 40.4  PLT 210   Recent Labs    03/07/18 0727  NA 140  K 3.8  CL 104  GLUCOSE 123*  BUN 23*  CREATININE 0.67  CALCIUM 8.9   CBG (last 3)  Recent Labs    03/06/18 1650 03/06/18 2126 03/07/18 0633  GLUCAP 118* 117* 114*    Wt Readings from Last 3 Encounters:  03/04/18 71.8 kg (158 lb 4.6 oz)  02/22/18 75.9 kg (167 lb 5.3 oz)  09/26/16 82.4 kg (181 lb 9.6 oz)    Physical Exam:  BP (!) 185/70 (BP Location: Left Arm)   Pulse 61   Temp 97.6 F (36.4 C) (Oral)   Resp 17   Ht 5\' 5"  (1.651 m)   Wt 71.8 kg (158 lb 4.6 oz)   SpO2 98%   BMI 26.34 kg/m  Constitutional: She appears well-developed and well-nourished.  HENT: Normocephalic and atraumatic.  Eyes: EOM are normal. No discharge.  Cardiovascular: Cardiac rate controlled.  Respiratory: Effort normal and breath sounds normal.  GI: Bowel sounds are normal. She exhibits no distension.  Musculoskeletal: LE edema and tenderness (unchanged) Neurological: She is alert.  Made good eye contact with examiner.   Fair awareness of deficits.   Follow commands.   Motor: RUE/RLE: 4/5 proximal to distal LUE/LLE: 4/5 proximal to distal (stronger than right, stable)  Skin: Skin is warm and dry Psychiatric: She has a normal mood and affect. Her behavior is normal.   Assessment/Plan: 1.  Functional deficits secondary to left thalamic left occipital infarcts as well as intracranial stenosis as well as history of CVA 2017 which require 3+ hours per day of interdisciplinary therapy in a comprehensive inpatient rehab setting. Physiatrist is providing close team supervision and 24 hour management of active medical problems listed below. Physiatrist and rehab team continue to assess barriers to discharge/monitor patient progress toward functional and medical  goals.  Function:  Bathing Bathing position   Position: Shower  Bathing parts Body parts bathed by patient: Right arm, Left arm, Chest, Abdomen, Front perineal area, Right upper leg, Left upper leg, Left lower leg, Right lower leg, Buttocks Body parts bathed by helper: Buttocks, Back  Bathing assist Assist Level: Touching or steadying assistance(Pt > 75%)      Upper Body Dressing/Undressing Upper body dressing   What is the patient wearing?: Bra, Pull over shirt/dress Bra - Perfomed by patient: Thread/unthread right bra strap, Thread/unthread left bra strap Bra - Perfomed by helper: Hook/unhook bra (pull down sports bra) Pull over shirt/dress - Perfomed by patient: Thread/unthread right sleeve, Thread/unthread left sleeve, Pull shirt over trunk, Put head through opening Pull over shirt/dress - Perfomed by helper: Put head through opening        Upper body assist Assist Level: Touching or steadying assistance(Pt > 75%)      Lower Body Dressing/Undressing Lower body dressing   What is the patient wearing?: Pants, Socks, Shoes, AFO Underwear - Performed by patient: Thread/unthread right underwear leg Underwear - Performed by helper: Thread/unthread left underwear leg, Pull underwear up/down, Thread/unthread right underwear leg Pants- Performed by patient: Thread/unthread right pants leg, Thread/unthread left pants leg Pants- Performed by helper: Pull pants up/down   Non-skid slipper socks- Performed by helper: Don/doff right sock, Don/doff left sock Socks - Performed by patient: Don/doff right sock, Don/doff left sock Socks - Performed by helper: Don/doff right sock, Don/doff left sock Shoes - Performed by patient: Don/doff right shoe, Don/doff left shoe Shoes - Performed by helper: Don/doff right shoe, Don/doff left shoe   AFO - Performed by helper: Don/doff right AFO      Lower body assist Assist for lower body dressing: Touching or steadying assistance (Pt > 75%)       Toileting Toileting   Toileting steps completed by patient: Performs perineal hygiene Toileting steps completed by helper: Adjust clothing prior to toileting, Performs perineal hygiene, Adjust clothing after toileting Toileting Assistive Devices: Grab bar or rail  Toileting assist Assist level: Two helpers   Transfers Chair/bed transfer   Chair/bed transfer method: Ambulatory Chair/bed transfer assist level: Touching or steadying assistance (Pt > 75%) Chair/bed transfer assistive device: Walker, Designer, fashion/clothingArmrests     Locomotion Ambulation     Max distance: 25 ft Assist level: Touching or steadying assistance (Pt > 75%)   Wheelchair Wheelchair activity did not occur: Safety/medical concerns   Max wheelchair distance: 45 ft Assist Level: Touching or steadying assistance (Pt > 75%)  Cognition Comprehension Comprehension assist level: Follows basic conversation/direction with no assist  Expression Expression assist level: Expresses basic needs/ideas: With no assist  Social Interaction Social Interaction assist level: Interacts appropriately 90% of the time - Needs monitoring or encouragement for participation or interaction.  Problem Solving Problem solving assist level: Solves basic 90% of the time/requires cueing < 10% of the time  Memory Memory assist level: Recognizes or recalls 75 - 89% of the time/requires cueing 10 - 24% of the time    Medical Problem List and Plan: 1.  Gait  disorder with dysphagia secondary to left thalamic left occipital infarcts as well as intracranial stenosis on 4/13 as well as history of CVA 2017  Cont CIR  2.  DVT Prophylaxis/Anticoagulation: SCDs.     Vascular study neg for DVT, however, suggestive of Baker's cyst, with edema and femoral artery calcification 3. Pain Management: Tylenol as needed 4. Mood: Provide emotional support 5. Neuropsych: This patient is ?fully capable of making decisions on her own behalf. 6. Skin/Wound Care: Routine skin checks 7.  Fluids/Electrolytes/Nutrition: Routine I/O's    BMP within acceptable range on 4/26, however BUN trending up  Encourage fluids 8.  Dysphagia.  Dysphasia #3 nectar liquids.  Follow-up speech therapy   Advance diet as tolerated 9.  Diastolic congestive heart failure.  Monitor for any signs of fluid overload Filed Weights   02/26/18 1846 03/04/18 0435  Weight: 76.1 kg (167 lb 12.3 oz) 71.8 kg (158 lb 4.6 oz)   ? Reliability 10.  Sick sinus syndrome status post pacemaker.  Follow-up per cardiology services 11.  Diabetes mellitus peripheral neuropathy.  Hemoglobin A1c 6.9.  SSI.  Check blood sugars before meals and at bedtime.    Patient on Amaryl 2 mg daily prior to admission.  Resume as needed   Amaryl resumed on 4/20 CBG (last 3)  Recent Labs    03/06/18 1650 03/06/18 2126 03/07/18 0633  GLUCAP 118* 117* 114*   Relatively controlled on 4/26 12.  Hypertension.  Toprol-XL 100 mg daily, Norvasc 5 mg twice daily.      Vitals:   03/06/18 2202 03/07/18 0430  BP: (!) 171/116 (!) 185/70  Pulse: 60 61  Resp:  17  Temp:  97.6 F (36.4 C)  SpO2:  98%   Lisinopril 5 started on 4/23, increased to twice a day on 4/25, increased to 10 mg twice a day on 4/26  Hypertensive crisis overnight 13.  Hyperlipidemia.  Zetia 14. Hypoalbuminemia  Supplement initiated on 4/15  15. PAD -has fairly consistent pain in BLE with partial relief using tylenol, fem art calcification likely chronic  Patient now amenable to further work-up, continue to await vascular recs   LOS (Days) 9 A FACE TO FACE EVALUATION WAS PERFORMED  Ankit Karis Juba 03/07/2018 8:54 AM

## 2018-03-08 ENCOUNTER — Inpatient Hospital Stay (HOSPITAL_COMMUNITY): Payer: Medicare Other | Admitting: Occupational Therapy

## 2018-03-08 DIAGNOSIS — S90121S Contusion of right lesser toe(s) without damage to nail, sequela: Secondary | ICD-10-CM

## 2018-03-08 LAB — GLUCOSE, CAPILLARY
GLUCOSE-CAPILLARY: 102 mg/dL — AB (ref 65–99)
Glucose-Capillary: 110 mg/dL — ABNORMAL HIGH (ref 65–99)
Glucose-Capillary: 126 mg/dL — ABNORMAL HIGH (ref 65–99)
Glucose-Capillary: 158 mg/dL — ABNORMAL HIGH (ref 65–99)

## 2018-03-08 NOTE — Progress Notes (Signed)
[place] PHYSICAL MEDICINE & REHABILITATION     PROGRESS NOTE  Subjective/Complaints:  Patient complains of right foot pain.  Apparently someone accidentally stepped on the foot yesterday.  X-rays were performed yesterday afternoon and Ace wrap was placed.  She still is wearing the Ace wrap today.  ROS: Patient denies fever, rash, sore throat, blurred vision, nausea, vomiting, diarrhea, cough, shortness of breath or chest pain,   headache, or mood change.    Objective: Vital Signs: Blood pressure (!) 153/69, pulse (!) 59, temperature 98 F (36.7 C), temperature source Oral, resp. rate 18, height  (1.651 m), weight 73.8 kg (162 lb 11.2 oz), SpO2 95 %. Dg Foot 2 Views Right  Result Date: 03/07/2018 CLINICAL DATA:  Pt's right foot was stepped on this AM, 2nd-4th digits. PT is having pain in the area that was stepped on. EXAM: RIGHT FOOT - 2 VIEW COMPARISON:  None. FINDINGS: No fracture.  No bone lesion. Joints are normally aligned. Bones are demineralized. Mild dorsal soft tissue swelling. IMPRESSION: 1. No fracture or dislocation. Electronically Signed   By: Amie Portland M.D.   On: 03/07/2018 17:15   Recent Labs    03/07/18 0727  WBC 5.7  HGB 13.1  HCT 40.4  PLT 210   Recent Labs    03/07/18 0727  NA 140  K 3.8  CL 104  GLUCOSE 123*  BUN 23*  CREATININE 0.67  CALCIUM 8.9   CBG (last 3)  Recent Labs    03/07/18 1635 03/07/18 2105 03/08/18 0655  GLUCAP 126* 130* 110*    Wt Readings from Last 3 Encounters:  03/07/18 73.8 kg (162 lb 11.2 oz)  02/22/18 75.9 kg (167 lb 5.3 oz)  09/26/16 82.4 kg (181 lb 9.6 oz)    Physical Exam:  BP (!) 153/69 (BP Location: Left Arm)   Pulse (!) 59   Temp 98 F (36.7 C) (Oral)   Resp 18   Ht  (1.651 m)   Wt 73.8 kg (162 lb 11.2 oz)   SpO2 95%   BMI 27.07 kg/m  Constitutional: No distress . Vital signs reviewed. HEENT: EOMI, oral membranes moist Neck: supple Cardiovascular: RRR without murmur. No JVD     Respiratory: CTA Bilaterally without wheezes or rales. Normal effort    GI: BS +, non-tender, non-distended  Musculoskeletal: Tenderness to palpation over the right first 3 metatarsals.  Mild associated swelling.  No erythema or skin breakdown.  The remainder of the foot and ankle appear normal Neurological: She is alert.  Made good eye contact with examiner.   Fair awareness of deficits.   Follow commands.   Motor: RUE/RLE: 4/5 proximal to distal LUE/LLE: 4/5 proximal to distal Skin: Skin is warm and dry Psychiatric: She has a normal mood and affect. Her behavior is normal.   Assessment/Plan: 1. Functional deficits secondary to left thalamic left occipital infarcts as well as intracranial stenosis as well as history of CVA 2017 which require 3+ hours per day of interdisciplinary therapy in a comprehensive inpatient rehab setting. Physiatrist is providing close team supervision and 24 hour management of active medical problems listed below. Physiatrist and rehab team continue to assess barriers to discharge/monitor patient progress toward functional and medical goals.  Function:  Bathing Bathing position   Position: Shower  Bathing parts Body parts bathed by patient: Right arm, Left arm, Chest, Abdomen, Front perineal area, Right upper leg, Left upper leg, Left lower leg, Right lower leg, Buttocks Body parts bathed by helper:  Back  Bathing assist Assist Level: Touching or steadying assistance(Pt > 75%)      Upper Body Dressing/Undressing Upper body dressing   What is the patient wearing?: Bra, Pull over shirt/dress Bra - Perfomed by patient: Thread/unthread left bra strap Bra - Perfomed by helper: Thread/unthread right bra strap, Hook/unhook bra (pull down sports bra) Pull over shirt/dress - Perfomed by patient: Thread/unthread right sleeve, Put head through opening Pull over shirt/dress - Perfomed by helper: Thread/unthread left sleeve, Pull shirt over trunk        Upper body  assist Assist Level: Touching or steadying assistance(Pt > 75%)      Lower Body Dressing/Undressing Lower body dressing   What is the patient wearing?: Pants, Socks, Shoes Underwear - Performed by patient: Thread/unthread right underwear leg Underwear - Performed by helper: Thread/unthread left underwear leg, Pull underwear up/down, Thread/unthread right underwear leg Pants- Performed by patient: Thread/unthread right pants leg, Thread/unthread left pants leg Pants- Performed by helper: Pull pants up/down   Non-skid slipper socks- Performed by helper: Don/doff right sock, Don/doff left sock Socks - Performed by patient: Don/doff right sock Socks - Performed by helper: Don/doff left sock Shoes - Performed by patient: Don/doff right shoe, Don/doff left shoe Shoes - Performed by helper: Don/doff right shoe, Don/doff left shoe   AFO - Performed by helper: Don/doff right AFO      Lower body assist Assist for lower body dressing: Touching or steadying assistance (Pt > 75%)      Toileting Toileting   Toileting steps completed by patient: Performs perineal hygiene Toileting steps completed by helper: Adjust clothing prior to toileting, Adjust clothing after toileting Toileting Assistive Devices: Grab bar or rail  Toileting assist Assist level: Touching or steadying assistance (Pt.75%)   Transfers Chair/bed transfer   Chair/bed transfer method: Stand pivot Chair/bed transfer assist level: Touching or steadying assistance (Pt > 75%) Chair/bed transfer assistive device: Walker, Designer, fashion/clothing     Max distance: 25' Assist level: Touching or steadying assistance (Pt > 75%)   Wheelchair Wheelchair activity did not occur: Safety/medical concerns   Max wheelchair distance: 45 ft Assist Level: Touching or steadying assistance (Pt > 75%)  Cognition Comprehension Comprehension assist level: Follows complex conversation/direction with extra time/assistive device   Expression Expression assist level: Expresses complex 90% of the time/cues < 10% of the time  Social Interaction Social Interaction assist level: Interacts appropriately with others with medication or extra time (anti-anxiety, antidepressant).  Problem Solving Problem solving assist level: Solves basic 90% of the time/requires cueing < 10% of the time  Memory Memory assist level: Recognizes or recalls 75 - 89% of the time/requires cueing 10 - 24% of the time    Medical Problem List and Plan: 1.  Gait disorder with dysphagia secondary to left thalamic left occipital infarcts as well as intracranial stenosis on 4/13 as well as history of CVA 2017  Cont CIR  2.  DVT Prophylaxis/Anticoagulation: SCDs.     Vascular study neg for DVT, however, suggestive of Baker's cyst, with edema and femoral artery calcification 3. Pain Management: Tylenol as needed   -Questionable right foot contusion.  X-rays reviewed and are negative.  -Add ice to help relieve pain.  Continue Tylenol as above 4. Mood: Provide emotional support 5. Neuropsych: This patient is ?fully capable of making decisions on her own behalf. 6. Skin/Wound Care: Routine skin checks 7. Fluids/Electrolytes/Nutrition: Routine I/O's    BMP within acceptable range on 4/26, however BUN trending  up  Encourage fluids--follow-up labs Monday 8.  Dysphagia.  Dysphasia #3 nectar liquids.  Follow-up speech therapy   Advance diet as tolerated 9.  Diastolic congestive heart failure.  Monitor for any signs of fluid overload Filed Weights   02/26/18 1846 03/04/18 0435 03/07/18 2258  Weight: 76.1 kg (167 lb 12.3 oz) 71.8 kg (158 lb 4.6 oz) 73.8 kg (162 lb 11.2 oz)     10.  Sick sinus syndrome status post pacemaker.  Follow-up per cardiology services 11.  Diabetes mellitus peripheral neuropathy.  Hemoglobin A1c 6.9.  SSI.  Check blood sugars before meals and at bedtime.    Patient on Amaryl 2 mg daily prior to admission.  Resume as needed   Amaryl  resumed on 4/20 CBG (last 3)  Recent Labs    03/07/18 1635 03/07/18 2105 03/08/18 0655  GLUCAP 126* 130* 110*   Relatively controlled on 4/27 12.  Hypertension.  Toprol-XL 100 mg daily, Norvasc 5 mg twice daily.      Vitals:   03/08/18 0415 03/08/18 0439  BP: (!) 190/81 (!) 153/69  Pulse: 65 (!) 59  Resp: 18   Temp: 98 F (36.7 C)   SpO2: 95%    Lisinopril 5 started on 4/23, increased to twice a day on 4/25, increased to 10 mg twice a day on 4/26  -Improved this morning, continue to monitor 13.  Hyperlipidemia.  Zetia 14. Hypoalbuminemia  Supplement initiated on 4/15  15. PAD -has fairly consistent pain in BLE with partial relief using tylenol, fem art calcification likely chronic  Patient now amenable to further work-up, continue to await vascular recs   LOS (Days) 10 A FACE TO FACE EVALUATION WAS PERFORMED  Ranelle Oyster 03/08/2018 8:25 AM

## 2018-03-08 NOTE — Progress Notes (Signed)
Occupational Therapy Session Note  Patient Details  Name: Maciel Kegg MRN: [id number] Date of Birth: 04-Apr-1922  Today's Date: 03/08/2018 OT Individual Time: 0920-0950 OT Individual Time Calculation (min): 30 min    Skilled Therapeutic Interventions/Progress Updates: Patient seen for skilled OT and was able to complete w/c to toilet transfer and back with mod assisst stand pivot, maximal assist and extra time to complete pericleansing and pulling up pants.   As well, she participated in seated endurance activities.    She was left seated in her w/c at the end of the session with call bell within reach and w/c alarm in place     Therapy Documentation Precautions:  Precautions Precautions: Fall Precaution Comments: L hemiparesis Restrictions Weight Bearing Restrictions: No Pain:denied  See Function Navigator for Current Functional Status.   Therapy/Group: Individual Therapy  [facility] 03/08/2018, 4:07 PM

## 2018-03-08 NOTE — Plan of Care (Signed)
  Problem: Consults Goal: Nutrition Consult-if indicated Outcome: Progressing Goal: Diabetes Guidelines if Diabetic/Glucose > 140 Description If diabetic or lab glucose is > 140 mg/dl - Initiate Diabetes/Hyperglycemia Guidelines & Document Interventions  Outcome: Progressing   Problem: RH BOWEL ELIMINATION Goal: RH STG MANAGE BOWEL WITH ASSISTANCE Description STG Manage Bowel with Assistance. Moderate assist  Outcome: Progressing   Problem: RH BLADDER ELIMINATION Goal: RH STG MANAGE BLADDER WITH ASSISTANCE Description STG Manage Bladder With Assistance mod assist  Outcome: Progressing   Problem: RH SKIN INTEGRITY Goal: RH STG MAINTAIN SKIN INTEGRITY WITH ASSISTANCE Description STG Maintain Skin Integrity With Assistance.Minimal assist  Outcome: Progressing   Problem: RH SAFETY Goal: RH STG ADHERE TO SAFETY PRECAUTIONS W/ASSISTANCE/DEVICE Description STG Adhere to Safety Precautions With Assistance/Device.with mod assist  Outcome: Progressing Goal: RH STG DECREASED RISK OF FALL WITH ASSISTANCE Description STG Decreased Risk of Fall With Assistance.with mod assist  Outcome: Progressing   Problem: RH KNOWLEDGE DEFICIT GENERAL Goal: RH STG INCREASE KNOWLEDGE OF SELF CARE AFTER HOSPITALIZATION Description Pt will be able to guide self care with min assist of caregivers  Outcome: Progressing

## 2018-03-09 LAB — GLUCOSE, CAPILLARY
GLUCOSE-CAPILLARY: 92 mg/dL (ref 65–99)
GLUCOSE-CAPILLARY: 133 mg/dL — AB (ref 65–99)
Glucose-Capillary: 140 mg/dL — ABNORMAL HIGH (ref 65–99)
Glucose-Capillary: 139 mg/dL — ABNORMAL HIGH (ref 65–99)

## 2018-03-09 NOTE — Progress Notes (Signed)
[facility] PHYSICAL MEDICINE & REHABILITATION     PROGRESS NOTE  Subjective/Complaints:  Right foot feeling better today. Did receive some ice which helped  ROS: Patient denies fever, rash, sore throat, blurred vision, nausea, vomiting, diarrhea, cough, shortness of breath or chest pain, joint or back pain, headache, or mood change.    Objective: Vital Signs: Blood pressure (!) 182/93, pulse 61, temperature 97.7 F (36.5 C), temperature source Oral, resp. rate 18, height  (1.651 m), weight 73.1 kg (161 lb 2.5 oz), SpO2 97 %. Dg Foot 2 Views Right  Result Date: 03/07/2018 CLINICAL DATA:  Pt's right foot was stepped on this AM, 2nd-4th digits. PT is having pain in the area that was stepped on. EXAM: RIGHT FOOT - 2 VIEW COMPARISON:  None. FINDINGS: No fracture.  No bone lesion. Joints are normally aligned. Bones are demineralized. Mild dorsal soft tissue swelling. IMPRESSION: 1. No fracture or dislocation. Electronically Signed   By: Amie Portland M.D.   On: 03/07/2018 17:15   Recent Labs    03/07/18 0727  WBC 5.7  HGB 13.1  HCT 40.4  PLT 210   Recent Labs    03/07/18 0727  NA 140  K 3.8  CL 104  GLUCOSE 123*  BUN 23*  CREATININE 0.67  CALCIUM 8.9   CBG (last 3)  Recent Labs    03/08/18 1634 03/08/18 2053 03/09/18 0629  GLUCAP 126* 158* 92    Wt Readings from Last 3 Encounters:  03/09/18 73.1 kg (161 lb 2.5 oz)  02/22/18 75.9 kg (167 lb 5.3 oz)  09/26/16 82.4 kg (181 lb 9.6 oz)    Physical Exam:  BP (!) 182/93 (BP Location: Left Arm)   Pulse 61   Temp 97.7 F (36.5 C) (Oral)   Resp 18   Ht  (1.651 m)   Wt 73.1 kg (161 lb 2.5 oz)   SpO2 97%   BMI 26.82 kg/m  Constitutional: No distress . Vital signs reviewed. HEENT: EOMI, oral membranes moist Neck: supple Cardiovascular: RRR without murmur. No JVD    Respiratory: CTA Bilaterally without wheezes or rales. Normal effort    GI: BS +, non-tender, non-distended  Musculoskeletal: right foot less  tender. Skin intact, foot warm Neurological: She is alert.  Made good eye contact with examiner.   Fair awareness of deficits.   Follow commands.   Motor: RUE/RLE: 4/5 proximal to distal LUE/LLE: 4/5 proximal to distal Skin: Skin is warm and dry Psychiatric: She has a normal mood and affect. Her behavior is normal.   Assessment/Plan: 1. Functional deficits secondary to left thalamic left occipital infarcts as well as intracranial stenosis as well as history of CVA 2017 which require 3+ hours per day of interdisciplinary therapy in a comprehensive inpatient rehab setting. Physiatrist is providing close team supervision and 24 hour management of active medical problems listed below. Physiatrist and rehab team continue to assess barriers to discharge/monitor patient progress toward functional and medical goals.  Function:  Bathing Bathing position   Position: Shower  Bathing parts Body parts bathed by patient: Right arm, Left arm, Chest, Abdomen, Front perineal area, Right upper leg, Left upper leg, Left lower leg, Right lower leg, Buttocks Body parts bathed by helper: Back  Bathing assist Assist Level: Touching or steadying assistance(Pt > 75%)      Upper Body Dressing/Undressing Upper body dressing   What is the patient wearing?: Bra, Pull over shirt/dress Bra - Perfomed by patient: Thread/unthread left bra strap Bra -  Perfomed by helper: Thread/unthread right bra strap, Hook/unhook bra (pull down sports bra) Pull over shirt/dress - Perfomed by patient: Thread/unthread right sleeve, Put head through opening Pull over shirt/dress - Perfomed by helper: Thread/unthread left sleeve, Pull shirt over trunk        Upper body assist Assist Level: Touching or steadying assistance(Pt > 75%)      Lower Body Dressing/Undressing Lower body dressing   What is the patient wearing?: Pants, Socks, Shoes Underwear - Performed by patient: Thread/unthread right underwear leg Underwear -  Performed by helper: Thread/unthread left underwear leg, Pull underwear up/down, Thread/unthread right underwear leg Pants- Performed by patient: Thread/unthread right pants leg, Thread/unthread left pants leg Pants- Performed by helper: Pull pants up/down   Non-skid slipper socks- Performed by helper: Don/doff right sock, Don/doff left sock Socks - Performed by patient: Don/doff right sock Socks - Performed by helper: Don/doff left sock Shoes - Performed by patient: Don/doff right shoe, Don/doff left shoe Shoes - Performed by helper: Don/doff right shoe, Don/doff left shoe   AFO - Performed by helper: Don/doff right AFO      Lower body assist Assist for lower body dressing: Touching or steadying assistance (Pt > 75%)      Toileting Toileting   Toileting steps completed by patient: Performs perineal hygiene Toileting steps completed by helper: Adjust clothing prior to toileting, Performs perineal hygiene, Adjust clothing after toileting Toileting Assistive Devices: Grab bar or rail  Toileting assist Assist level: Touching or steadying assistance (Pt.75%)   Transfers Chair/bed transfer   Chair/bed transfer method: Stand pivot Chair/bed transfer assist level: Touching or steadying assistance (Pt > 75%) Chair/bed transfer assistive device: Walker, Designer, fashion/clothing     Max distance: 25' Assist level: Touching or steadying assistance (Pt > 75%)   Wheelchair Wheelchair activity did not occur: Safety/medical concerns   Max wheelchair distance: 45 ft Assist Level: Touching or steadying assistance (Pt > 75%)  Cognition Comprehension Comprehension assist level: Follows basic conversation/direction with no assist  Expression Expression assist level: Expresses basic needs/ideas: With no assist  Social Interaction Social Interaction assist level: Interacts appropriately 75 - 89% of the time - Needs redirection for appropriate language or to initiate interaction.   Problem Solving Problem solving assist level: Solves basic 90% of the time/requires cueing < 10% of the time  Memory Memory assist level: Recognizes or recalls 75 - 89% of the time/requires cueing 10 - 24% of the time    Medical Problem List and Plan: 1.  Gait disorder with dysphagia secondary to left thalamic left occipital infarcts as well as intracranial stenosis on 4/13 as well as history of CVA 2017  Cont CIR  2.  DVT Prophylaxis/Anticoagulation: SCDs.     Vascular study neg for DVT, however, suggestive of Baker's cyst, with edema and femoral artery calcification 3. Pain Management: Tylenol as needed   -Questionable right foot contusion.  X-rays reviewed and are negative.  -ice prn 4. Mood: Provide emotional support 5. Neuropsych: This patient is ?fully capable of making decisions on her own behalf. 6. Skin/Wound Care: Routine skin checks 7. Fluids/Electrolytes/Nutrition: Routine I/O's    BMP within acceptable range on 4/26, however BUN trending up  Encourage fluids--follow-up labs Monday 8.  Dysphagia.  Dysphasia #3 nectar liquids.  Follow-up speech therapy   Advance diet as tolerated 9.  Diastolic congestive heart failure.  Monitor for any signs of fluid overload Filed Weights   03/04/18 0435 03/07/18 2258 03/09/18 0329  Weight:  71.8 kg (158 lb 4.6 oz) 73.8 kg (162 lb 11.2 oz) 73.1 kg (161 lb 2.5 oz)     10.  Sick sinus syndrome status post pacemaker.  Follow-up per cardiology services 11.  Diabetes mellitus peripheral neuropathy.  Hemoglobin A1c 6.9.  SSI.  Check blood sugars before meals and at bedtime.    Patient on Amaryl 2 mg daily prior to admission.  Resume as needed   Amaryl resumed on 4/20 CBG (last 3)  Recent Labs    03/08/18 1634 03/08/18 2053 03/09/18 0629  GLUCAP 126* 158* 92   Relatively controlled on 4/28 12.  Hypertension.  Toprol-XL 100 mg daily, Norvasc 5 mg twice daily.      Vitals:   03/08/18 2055 03/09/18 0329  BP: (!) 151/67 (!) 182/93  Pulse:   61  Resp:  18  Temp:  97.7 F (36.5 C)  SpO2:  97%   Lisinopril 5 started on 4/23, increased to twice a day on 4/25, increased to 10 mg twice a day on 4/26  -has shown general improvement---consider further increase if further elevation 13.  Hyperlipidemia.  Zetia 14. Hypoalbuminemia  Supplement initiated on 4/15  15. PAD -has fairly consistent pain in BLE with partial relief using tylenol, fem art calcification likely chronic  Patient now amenable to further work-up, continue to await vascular recs   LOS (Days) 11 A FACE TO FACE EVALUATION WAS PERFORMED  Ranelle Oyster 03/09/2018 8:54 AM

## 2018-03-10 ENCOUNTER — Inpatient Hospital Stay (HOSPITAL_COMMUNITY): Payer: Medicare Other

## 2018-03-10 ENCOUNTER — Inpatient Hospital Stay (HOSPITAL_COMMUNITY): Payer: Medicare Other | Admitting: Physical Therapy

## 2018-03-10 DIAGNOSIS — G479 Sleep disorder, unspecified: Secondary | ICD-10-CM

## 2018-03-10 LAB — GLUCOSE, CAPILLARY
Glucose-Capillary: 111 mg/dL — ABNORMAL HIGH (ref 65–99)
Glucose-Capillary: 127 mg/dL — ABNORMAL HIGH (ref 65–99)
Glucose-Capillary: 149 mg/dL — ABNORMAL HIGH (ref 65–99)
Glucose-Capillary: 155 mg/dL — ABNORMAL HIGH (ref 65–99)

## 2018-03-10 MED ORDER — TRAZODONE HCL 50 MG PO TABS
25.0000 mg | ORAL_TABLET | Freq: Every day | ORAL | Status: DC
  Administered 2018-03-10 – 2018-03-12 (×3): 25 mg via ORAL
  Filled 2018-03-10 (×3): qty 1

## 2018-03-10 MED ORDER — LISINOPRIL 5 MG PO TABS
5.0000 mg | ORAL_TABLET | Freq: Once | ORAL | Status: AC
  Administered 2018-03-10: 5 mg via ORAL
  Filled 2018-03-10: qty 1

## 2018-03-10 MED ORDER — LISINOPRIL 10 MG PO TABS
15.0000 mg | ORAL_TABLET | Freq: Two times a day (BID) | ORAL | Status: DC
  Administered 2018-03-10 – 2018-03-12 (×4): 15 mg via ORAL
  Filled 2018-03-10 (×4): qty 1

## 2018-03-10 NOTE — Progress Notes (Signed)
Physical Therapy Session Note  Patient Details  Name: Charlene Greene MRN: [id number] Date of Birth: [birth year]  Today's Date: 03/10/2018 PT Individual Time: 1415-1530 PT Individual Time Calculation (min): 75 min   Short Term Goals: Week 2:  PT Short Term Goal 1 (Week 2): =LTG due to estimated LOS  Skilled Therapeutic Interventions/Progress Updates: Pt received seated in w/c, reports significant fatigue "I can barely keep my eyes open", but agreeable to treatment. Pt remains alert throughout most of session, not increasingly limited by fatigue compared to previous sessions. W/c propulsion x40' with BUE; difficulty maintaining straight trajectory d/t LUE incoordination. Pt self-selected BLE propulsion as that is how she propels transport chair prior to admission; propels an additional 82' with BLE for strengthening and endurance. Gait x50' with RW and minA/min guard; significantly slow gait speed, requires ongoing cues and encouragement to continue trial and aim for target distance; does better when given target to aim for. Stand pivot transfer w/c >nustep with no AD, modA to boost to stand, min guard during pivot. Performed BUE/BLE nustep with average 50 steps/min on level 5 x9 min total, one 3 min long rest break after performing 4 min on nustep. Gait x50' with RW and min guard; mild increase in gait speed compared to previous trial. Returned to room totalA. Stand pivot transfer to bed with RW and minA. Sit >supine with S and increased time. Supine straight leg raises and ankle pumps 2x15 reps. Remained supine in bed, all needs in reach, alarm intact.      Therapy Documentation Precautions:  Precautions Precautions: Fall Precaution Comments: L hemiparesis Restrictions Weight Bearing Restrictions: No Pain: Pain Assessment Pain Scale: 0-10 Pain Score: 0-No pain   See Function Navigator for Current Functional Status.   Therapy/Group: Individual Therapy  Vista Lawman 03/10/2018,  3:18 PM

## 2018-03-10 NOTE — Progress Notes (Signed)
[facility] PHYSICAL MEDICINE & REHABILITATION     PROGRESS NOTE  Subjective/Complaints:  Patient seen lying in bed this Am.  She states she did not sleep well overnight and requests medications to help her sleep.  She also would like to know when she is going to be discharge.   ROS: Denies CP, SOB, N/V/D.  Objective: Vital Signs: Blood pressure (!) 161/86, pulse 61, temperature (!) 97.5 F (36.4 C), temperature source Oral, resp. rate 18, height  (1.651 m), weight 74.3 kg (163 lb 12.8 oz), SpO2 96 %. No results found. No results for input(s): WBC, HGB, HCT, PLT in the last 72 hours. No results for input(s): NA, K, CL, GLUCOSE, BUN, CREATININE, CALCIUM in the last 72 hours.  Invalid input(s): CO CBG (last 3)  Recent Labs    03/09/18 1707 03/09/18 2048 03/10/18 0643  GLUCAP 140* 139* 111*    Wt Readings from Last 3 Encounters:  03/10/18 74.3 kg (163 lb 12.8 oz)  02/22/18 75.9 kg (167 lb 5.3 oz)  09/26/16 82.4 kg (181 lb 9.6 oz)    Physical Exam:  BP (!) 161/86 (BP Location: Left Arm)   Pulse 61   Temp (!) 97.5 F (36.4 C) (Oral)   Resp 18   Ht  (1.651 m)   Wt 74.3 kg (163 lb 12.8 oz)   SpO2 96%   BMI 27.26 kg/m  Constitutional: No distress . Vital signs reviewed. HENT: Normocephalic, atraumatic.  Eyes: EOMI. No discharge.  Cardiovascular: RRR. No JVD    Respiratory: CTA Bilaterally. Normal effort    GI: BS +, non-distended  Musculoskeletal: B/l LE TTP Neurological: She is alert.  Made good eye contact with examiner.   Fair awareness of deficits.   Follow commands.   Motor: RUE/RLE: 4/5 proximal to distal LUE/LLE: 4/5 proximal to distal (unchanged) Skin: Skin is warm and dry Psychiatric: She has a normal mood and affect. Her behavior is normal.   Assessment/Plan: 1. Functional deficits secondary to left thalamic left occipital infarcts as well as intracranial stenosis as well as history of CVA 2017 which require 3+ hours per day of interdisciplinary  therapy in a comprehensive inpatient rehab setting. Physiatrist is providing close team supervision and 24 hour management of active medical problems listed below. Physiatrist and rehab team continue to assess barriers to discharge/monitor patient progress toward functional and medical goals.  Function:  Bathing Bathing position   Position: Shower  Bathing parts Body parts bathed by patient: Right arm, Left arm, Chest, Abdomen, Front perineal area, Right upper leg, Left upper leg, Left lower leg, Right lower leg, Buttocks Body parts bathed by helper: Back  Bathing assist Assist Level: Touching or steadying assistance(Pt > 75%)      Upper Body Dressing/Undressing Upper body dressing   What is the patient wearing?: Bra, Pull over shirt/dress Bra - Perfomed by patient: Thread/unthread left bra strap Bra - Perfomed by helper: Thread/unthread right bra strap, Hook/unhook bra (pull down sports bra) Pull over shirt/dress - Perfomed by patient: Thread/unthread right sleeve, Put head through opening Pull over shirt/dress - Perfomed by helper: Thread/unthread left sleeve, Pull shirt over trunk        Upper body assist Assist Level: Touching or steadying assistance(Pt > 75%)      Lower Body Dressing/Undressing Lower body dressing   What is the patient wearing?: Pants, Socks, Shoes Underwear - Performed by patient: Thread/unthread right underwear leg Underwear - Performed by helper: Thread/unthread left underwear leg, Pull underwear up/down, Thread/unthread  right underwear leg Pants- Performed by patient: Thread/unthread right pants leg, Thread/unthread left pants leg Pants- Performed by helper: Pull pants up/down   Non-skid slipper socks- Performed by helper: Don/doff right sock, Don/doff left sock Socks - Performed by patient: Don/doff right sock Socks - Performed by helper: Don/doff left sock Shoes - Performed by patient: Don/doff right shoe, Don/doff left shoe Shoes - Performed by  helper: Don/doff right shoe, Don/doff left shoe   AFO - Performed by helper: Don/doff right AFO      Lower body assist Assist for lower body dressing: Touching or steadying assistance (Pt > 75%)      Toileting Toileting   Toileting steps completed by patient: Performs perineal hygiene Toileting steps completed by helper: Adjust clothing prior to toileting, Performs perineal hygiene, Adjust clothing after toileting Toileting Assistive Devices: Grab bar or rail  Toileting assist Assist level: Touching or steadying assistance (Pt.75%)   Transfers Chair/bed transfer   Chair/bed transfer method: Stand pivot Chair/bed transfer assist level: Touching or steadying assistance (Pt > 75%) Chair/bed transfer assistive device: Environmental consultant, Designer, fashion/clothing     Max distance: 25' Assist level: Touching or steadying assistance (Pt > 75%)   Wheelchair Wheelchair activity did not occur: Safety/medical concerns   Max wheelchair distance: 45 ft Assist Level: Touching or steadying assistance (Pt > 75%)  Cognition Comprehension Comprehension assist level: Understands basic 75 - 89% of the time/ requires cueing 10 - 24% of the time  Expression Expression assist level: Expresses basic 75 - 89% of the time/requires cueing 10 - 24% of the time. Needs helper to occlude trach/needs to repeat words.  Social Interaction Social Interaction assist level: Interacts appropriately 75 - 89% of the time - Needs redirection for appropriate language or to initiate interaction.  Problem Solving Problem solving assist level: Solves basic 75 - 89% of the time/requires cueing 10 - 24% of the time  Memory Memory assist level: Recognizes or recalls 75 - 89% of the time/requires cueing 10 - 24% of the time    Medical Problem List and Plan: 1.  Gait disorder with dysphagia secondary to left thalamic left occipital infarcts as well as intracranial stenosis on 4/13 as well as history of CVA 2017  Cont CIR    Weekend notes reviewed 2.  DVT Prophylaxis/Anticoagulation: SCDs.     Vascular study neg for DVT, however, suggestive of Baker's cyst, with edema and femoral artery calcification 3. Pain Management: Tylenol as needed  Ice prn  Right foot film reviewed, unremarkable 4. Mood: Provide emotional support 5. Neuropsych: This patient is ?fully capable of making decisions on her own behalf. 6. Skin/Wound Care: Routine skin checks 7. Fluids/Electrolytes/Nutrition: Routine I/O's    BMP within acceptable range on 4/26, however BUN trending up  Encourage fluids  Labs pending 8.  Dysphagia.  Dysphasia #3 nectar liquids.  Follow-up speech therapy   Advance diet as tolerated 9.  Diastolic congestive heart failure.  Monitor for any signs of fluid overload Filed Weights   03/07/18 2258 03/09/18 0329 03/10/18 0614  Weight: 73.8 kg (162 lb 11.2 oz) 73.1 kg (161 lb 2.5 oz) 74.3 kg (163 lb 12.8 oz)   Stable on 4/29  10.  Sick sinus syndrome status post pacemaker.  Follow-up per cardiology services 11.  Diabetes mellitus peripheral neuropathy.  Hemoglobin A1c 6.9.  SSI.  Check blood sugars before meals and at bedtime.    Patient on Amaryl 2 mg daily prior to admission.  Resume as  needed   Amaryl resumed on 4/20 CBG (last 3)  Recent Labs    03/09/18 1707 03/09/18 2048 03/10/18 0643  GLUCAP 140* 139* 111*   Relatively controlled on 4/29 12.  Hypertension.  Toprol-XL 100 mg daily, Norvasc 5 mg twice daily.      Vitals:   03/09/18 1950 03/10/18 0614  BP: 132/61 (!) 161/86  Pulse: 60 61  Resp:  18  Temp:  (!) 97.5 F (36.4 C)  SpO2:  96%   Lisinopril 5 started on 4/23, increased to twice a day on 4/25, increased to 10 mg twice a day on 4/26, increased to 15 BID on 4/29 13.  Hyperlipidemia.  Zetia 14. Hypoalbuminemia  Supplement initiated on 4/15  15. PAD  Has fairly consistent pain in BLE with partial relief using tylenol, fem art calcification likely chronic  Patient now amenable to further  work-up, cont to await vascular recs  16. Sleep disturbance  Low dose trazodone started on 4/29  LOS (Days) 12 A FACE TO FACE EVALUATION WAS PERFORMED  Ankit Karis Juba 03/10/2018 9:03 AM

## 2018-03-10 NOTE — Progress Notes (Signed)
Speech Language Pathology Daily Session Note  Patient Details  Name: Charlene Greene MRN: [id number] Date of Birth: 29-Dec-1921  Today's Date: 03/10/2018 SLP Individual Time: 1000-1045 SLP Individual Time Calculation (min): 45 min  Short Term Goals: Week 2: SLP Short Term Goal 1 (Week 2): Pt will consume regular textures and thin liquids with mod I use of swallowing precautions and no overt s/s of aspiration.  SLP Short Term Goal 2 (Week 2): Pt will selectively attend to tasks in a mildly distracting environment for 45 minutes with mod I redirection to task.  SLP Short Term Goal 3 (Week 2): Pt will recall daily information with supervision verbal cues for use of external aids.   SLP Short Term Goal 4 (Week 2): Pt will complete mildly complex tasks with supervision cues for functional problem solving.    Skilled Therapeutic Interventions:Skilled ST services focused on -swallow and cognitive skills. SLP facilitated PO consumption of thin liquids and regular textured foods with cough noted on solids only. SLP facilitated mildly complex problem solving utilizing money management from AL[facility], pt required supervision A verbal/question cues. Pt demonstrated ability to follow commands and direct care when transferring from bed to bathroom with supervision A verbal cues.. Pt was left in room with call bell within reach. Recommend to continue skilled ST services.     Function:  Eating Eating   Modified Consistency Diet: Yes Eating Assist Level: Swallowing techniques: self managed           Cognition Comprehension Comprehension assist level: Understands basic 75 - 89% of the time/ requires cueing 10 - 24% of the time  Expression   Expression assist level: Expresses basic 75 - 89% of the time/requires cueing 10 - 24% of the time. Needs helper to occlude trach/needs to repeat words.  Social Interaction Social Interaction assist level: Interacts appropriately 75 - 89% of the time - Needs redirection for  appropriate language or to initiate interaction.  Problem Solving Problem solving assist level: Solves basic 75 - 89% of the time/requires cueing 10 - 24% of the time  Memory Memory assist level: Recognizes or recalls 75 - 89% of the time/requires cueing 10 - 24% of the time    Pain Pain Assessment Pain Score: 0-No pain  Therapy/Group: Individual Therapy  Jhonathan Desroches  [facility] 03/10/2018, 3:59 PM

## 2018-03-10 NOTE — Progress Notes (Signed)
Occupational Therapy Session Note  Patient Details  Name: Charlene Greene MRN: [id number] Date of Birth: 05-Mar-1922  Today's Date: 03/10/2018 OT Individual Time: 5894-8347 OT Individual Time Calculation (min): 68 min    Short Term Goals: Week 2:  OT Short Term Goal 1 (Week 2): Continue working on established LTGs set at supervision to min assist level.   Skilled Therapeutic Interventions/Progress Updates:    Pt received on toilet from NT. RN alerted to pt having diarrhea. Pt transferred back to w/c with vc for grab bar use and sequencing UE placement. Pt completed stand pivot transfer to shower bench, with vc required for use of grab bars and for turning while standing.  Pt completed UB/LB bathing from seated level, with touching A provided during standing level peri-cleansing. Increased time required for pt to complete distal LE bathing, and min A provided to complete figure 4 technique to limit unsafe forward flexion. Pt completed stand pivot transfer back to w/c with touching A provided for safety d/t slippery floor. Pt required manual cues to thread R sleeve past elbow and for thoroughness in pulling shirt down posteriorly. Pt threaded B LE through pants and required manual A to pull up pants in standing. Pt began to fatigue with dressing, fluctuating between min-mod A required for sit to stand transfers. Education provided re Economist and proper transfer techniques, I.e. Not pulling up on RW, however pt unable to carryover between transfers and required frequent vc. Pt set up at sink to perform oral care and to apply makeup. Pt reported episode of urinary incontinence and a brief change was performed, with mod A for clothing management in standing. Discussion with pt re d/c planning and level of care provided at ALF, as well as encouragement/edu re importance of engagement in leisure task/social outings. Pt left with all needs met sitting up in w/c, with QRB donned and chair alarm activated.    Therapy Documentation Precautions:  Precautions Precautions: Fall Precaution Comments: L hemiparesis Restrictions Weight Bearing Restrictions: No Pain: Pain Assessment Pain Scale: 0-10 Pain Score: 0-No pain Pain Type: Acute pain Pain Location: Foot Pain Orientation: Right Pain Descriptors / Indicators: Discomfort Pain Frequency: Intermittent Pain Onset: Gradual  See Function Navigator for Current Functional Status.   Therapy/Group: Individual Therapy  Curtis Sites 03/10/2018, 12:00 PM

## 2018-03-10 NOTE — Consult Note (Signed)
Vascular and Vein Specialist of [facility]  Patient name: Charlene Greene MRN: [id number] DOB: 1922-03-19 Sex: female  REASON FOR CONSULT: Painful right foot  HPI: Charlene Greene is a 84 y.o. female, who is seen in consultation for evaluation of painful right foot.  She is a very pleasant alert and oriented 83 year old.  She is admitted after suffering a recent stroke and is rehabilitation.  She reports that she was walking prior to admission but now is requiring assistance with standing.  Apparently 2 nights ago she did have pain in her right foot and reports that this is resolved since that time.  No prior history of severe peripheral vascular disease.  Does have history of renal artery stenting for renal artery stenosis in the past.  She has no history of tissue loss.  Past Medical History:  Diagnosis Date  . CAD (coronary artery disease)   . Diabetes mellitus without complication (HCC)   . Hyperlipidemia   . Hypertension   . Pacemaker   . Renal artery stenosis (HCC)    s/p bilateral stents  . Sick sinus syndrome (HCC)   . Stroke [facility])    2 previous cva's    Family History  Problem Relation Age of Onset  . Lung cancer Sister   . Hypertension Other   . CAD Neg Hx   . Stroke Neg Hx     SOCIAL HISTORY: Social History   Socioeconomic History  . Marital status: Widowed    Spouse name: Not on file  . Number of children: Not on file  . Years of education: Not on file  . Highest education level: Not on file  Occupational History  . Not on file  Social Needs  . Financial resource strain: Not on file  . Food insecurity:    Worry: Not on file    Inability: Not on file  . Transportation needs:    Medical: Not on file    Non-medical: Not on file  Tobacco Use  . Smoking status: Never Smoker  . Smokeless tobacco: Never Used  Substance and Sexual Activity  . Alcohol use: No  . Drug use: Never  . Sexual activity: Not on file  Lifestyle  .  Physical activity:    Days per week: Not on file    Minutes per session: Not on file  . Stress: Not on file  Relationships  . Social connections:    Talks on phone: Not on file    Gets together: Not on file    Attends religious service: Not on file    Active member of club or organization: Not on file    Attends meetings of clubs or organizations: Not on file    Relationship status: Not on file  . Intimate partner violence:    Fear of current or ex partner: Not on file    Emotionally abused: Not on file    Physically abused: Not on file    Forced sexual activity: Not on file  Other Topics Concern  . Not on file  Social History Narrative  . Not on file    Allergies  Allergen Reactions  . Statins Other (See Comments)    Intol to numerous statins still feels has some muscle weakness, myalgia  . Other Other (See Comments)    Unknown reaction to dye (listed on [facility] 02/22/18)  . Amlodipine Other (See Comments)    unknown  . Celecoxib Nausea Only and Other (See Comments)  . Codeine Other (See Comments)  unknown  . Naproxen Other (See Comments)    unknown  . Piroxicam Other (See Comments)    unknown    Current Facility-Administered Medications  Medication Dose Route Frequency Provider Last Rate Last Dose  . acetaminophen (TYLENOL) tablet 650 mg  650 mg Oral Q4H PRN Charlton Amor, PA-C   650 mg at 03/10/18 1610   Or  . acetaminophen (TYLENOL) solution 650 mg  650 mg Per Tube Q4H PRN Charlton Amor, PA-C   650 mg at 03/09/18 9604   Or  . acetaminophen (TYLENOL) suppository 650 mg  650 mg Rectal Q4H PRN Angiulli, Mcarthur Rossetti, PA-C      . albuterol (PROVENTIL) (2.5 MG/3ML) 0.083% nebulizer solution 2.5 mg  2.5 mg Nebulization Q4H PRN Angiulli, Mcarthur Rossetti, PA-C      . amLODipine (NORVASC) tablet 5 mg  5 mg Oral BID AngiulliMcarthur Rossetti, PA-C   5 mg at 03/10/18 5409  . aspirin EC tablet 81 mg  81 mg Oral Daily Charlton Amor, PA-C   81 mg at 03/10/18 8119  . clopidogrel  (PLAVIX) tablet 75 mg  75 mg Oral Daily Charlton Amor, PA-C   75 mg at 03/10/18 1478  . ezetimibe (ZETIA) tablet 10 mg  10 mg Oral Daily Charlton Amor, PA-C   10 mg at 03/10/18 2956  . feeding supplement (PRO-STAT SUGAR FREE 64) liquid 30 mL  30 mL Oral BID Marcello Fennel, MD   30 mL at 03/10/18 2130  . glimepiride (AMARYL) tablet 1 mg  1 mg Oral Q breakfast Kirsteins, Victorino Sparrow, MD   1 mg at 03/10/18 8657  . guaiFENesin (MUCINEX) 12 hr tablet 600 mg  600 mg Oral BID Charlton Amor, PA-C   600 mg at 03/10/18 8469  . insulin aspart (novoLOG) injection 0-5 Units  0-5 Units Subcutaneous QHS Erick Colace, MD   2 Units at 03/02/18 2150  . insulin aspart (novoLOG) injection 0-9 Units  0-9 Units Subcutaneous TID WC Kirsteins, Victorino Sparrow, MD   1 Units at 03/10/18 1224  . lisinopril (PRINIVIL,ZESTRIL) tablet 15 mg  15 mg Oral BID Marcello Fennel, MD      . metoprolol succinate (TOPROL-XL) 24 hr tablet 100 mg  100 mg Oral Daily Charlton Amor, PA-C   100 mg at 03/10/18 6295  . ondansetron (ZOFRAN) tablet 4 mg  4 mg Oral Q6H PRN Angiulli, Mcarthur Rossetti, PA-C       Or  . ondansetron [facility]) injection 4 mg  4 mg Intravenous Q6H PRN Angiulli, Mcarthur Rossetti, PA-C      . pantoprazole (PROTONIX) EC tablet 20 mg  20 mg Oral Daily Marcello Fennel, MD   20 mg at 03/10/18 2841  . RESOURCE THICKENUP CLEAR   Oral PRN Angiulli, Mcarthur Rossetti, PA-C      . senna-docusate (Senokot-S) tablet 1 tablet  1 tablet Oral QHS PRN Angiulli, Mcarthur Rossetti, PA-C      . sorbitol 70 % solution 30 mL  30 mL Oral Daily PRN Angiulli, Mcarthur Rossetti, PA-C      . traZODone (DESYREL) tablet 25 mg  25 mg Oral QHS PRN Ranelle Oyster, MD   25 mg at 03/07/18 2115  . traZODone (DESYREL) tablet 25 mg  25 mg Oral QHS Patel, Maryln Gottron, MD        REVIEW OF SYSTEMS:  Reviewed in her history and physical with nothing to add  PHYSICAL EXAM: Vitals:   03/09/18 0329  03/09/18 1533 03/09/18 1950 03/10/18 0614  BP: (!) 182/93 134/64 132/61 (!)  161/86  Pulse: 61 60 60 61  Resp: Temp: 97.7 F (36.5 C) 99 F (37.2 C)  (!) 97.5 F (36.4 C)  TempSrc: Oral Oral  Oral  SpO2: 97% 100%  96%  Weight: 161 lb 2.5 oz (73.1 kg)   163 lb 12.8 oz (74.3 kg)  Height:        GENERAL: The patient is a well-nourished female, in no acute distress. The vital signs are documented above. CARDIOVASCULAR: Palpable radial pulses.  No palpable popliteal or distal pulses. PULMONARY: There is good air exchange  ABDOMEN: Soft and non-tender  MUSCULOSKELETAL: There are no major deformities or cyanosis. NEUROLOGIC: No focal weakness or paresthesias are detected. SKIN: Small superficial ulceration over her right Achilles region. PSYCHIATRIC: The patient has a normal affect. Both feet are warm and well perfused with no evidence of chronic ischemia  DATA:  She did have a venous rule out DVT study which was negative.  Will obtain ankle arm index arterial studies  MEDICAL ISSUES: Pain in her right foot several days ago which has resolved.  Clinically she does not have critical limb ischemia and I do not think this is arterial rest pain.  Will obtain noninvasive arterial studies for baseline.  We will follow-up after these have been accomplished   Larina Earthly, MD [facility] Vascular and Vein Specialists of [facility] [phone] Pager [phone]

## 2018-03-11 ENCOUNTER — Inpatient Hospital Stay (HOSPITAL_COMMUNITY): Payer: Medicare Other | Admitting: Physical Therapy

## 2018-03-11 ENCOUNTER — Inpatient Hospital Stay (HOSPITAL_COMMUNITY): Payer: Medicare Other | Admitting: Occupational Therapy

## 2018-03-11 ENCOUNTER — Inpatient Hospital Stay (HOSPITAL_COMMUNITY): Payer: Medicare Other | Admitting: Speech Pathology

## 2018-03-11 ENCOUNTER — Inpatient Hospital Stay (HOSPITAL_COMMUNITY): Payer: Medicare Other

## 2018-03-11 DIAGNOSIS — M79609 Pain in unspecified limb: Secondary | ICD-10-CM

## 2018-03-11 LAB — GLUCOSE, CAPILLARY
Glucose-Capillary: 107 mg/dL — ABNORMAL HIGH (ref 65–99)
Glucose-Capillary: 124 mg/dL — ABNORMAL HIGH (ref 65–99)
Glucose-Capillary: 100 mg/dL — ABNORMAL HIGH (ref 65–99)
Glucose-Capillary: 138 mg/dL — ABNORMAL HIGH (ref 65–99)

## 2018-03-11 NOTE — Progress Notes (Signed)
Occupational Therapy Session Note  Patient Details  Name: Charlene Greene MRN: [id number] Date of Birth: 09-Apr-1922  Today's Date: 03/11/2018 OT Individual Time: 0930-1030 OT Individual Time Calculation (min): 60 min    Short Term Goals: Week 2:  OT Short Term Goal 1 (Week 2): Continue working on established LTGs set at supervision to min assist level.   Skilled Therapeutic Interventions/Progress Updates:    Pt received in w/c. She stated she had a difficult time with standing earlier getting off the [facility]. Pt only dressed today as she showered yesterday. Pt completed dressing with assistance (min bra, mod pants, max shoes).   She worked on sit to stand with scooting to edge of w/c, cues for forward lean and to push with both arms.  In standing, pt can stand statically with RW with S.  When she tried to pull pants up with one hand at a time she needed min A to stabilize her balance.    Pt practiced sit to stand to RW and than stand pivot with RW to [facility].  Her transfer with RW was smooth and only CGA needed.  Cued to reach back to sit slowly.  Pt was able to sit >< stand with min A. She does well with a prep cue such as rock forward 3x and then rock to stand.   Pt then transferred to EOB to practice bed to ambulating 4 ft to [facility].  Pt became fatigued and needed to pull BSC closer to pt.  She had difficulty advancing R foot, cued with tactile cue and min A to wt shift to her L to be able to advance her R foot. Improved control with stand to sit using arms.  Pt completed one last transfer to wc.  As pt went to sit, she became fatigued and "plopped" in the w/c.  Pt's level of assist needed for functional mobility increases as the session time passes due to fatigue.   Resting in chair with belt and chair alarm. Call light in reach.  Pt states she is ready to return to her ALF and feels she will have adequate assist from staff.  Therapy Documentation Precautions:  Precautions Precautions: Fall Precaution  Comments: L hemiparesis Restrictions Weight Bearing Restrictions: No  Pain: Pain Assessment Pain Scale: 0-10 Pain Score: 0-No pain   ADL:   See Function Navigator for Current Functional Status.   Therapy/Group: Individual Therapy  Raidon Swanner 03/11/2018, 10:05 AM

## 2018-03-11 NOTE — Progress Notes (Addendum)
VASCULAR LAB PRELIMINARY  ARTERIAL  ABI completed:    RIGHT    LEFT    PRESSURE WAVEFORM  PRESSURE WAVEFORM  BRACHIAL 138 Biphasic BRACHIAL 141 Triphasic  DP 67 monophasic DP 0 Not audible  AT  Not audible AT  Not audible  PT 0 Not audible PT 0 Not audible  PER   PER    GREAT TOE  No PPG waveforms detedted GREAT TOE 0 No PPG waveforms detedted    RIGHT LEFT  ABI 0.48 0     HONGYING  Julie-Ann Vanmaanen, RVT   Jill E   RVT 03/11/2018, 3:08 PM

## 2018-03-11 NOTE — NC FL2 (Addendum)
[place] MEDICAID FL2 LEVEL OF CARE SCREENING TOOL     IDENTIFICATION  Patient Name: Charlene Greene Birthdate: August 12, 1922 Sex: female Admission Date (Current Location): 02/26/2018  [facility] and [place] Number:  Producer, television/film/video and Address:  [facility] [place], [address], [place], [place] [address]      Provider Number: [id number]  Attending Physician Name and Address:  Marcello Fennel, MD  Relative Name and Phone Number:  Marcheta Grammes [phone]    Current Level of Care: Other (Comment)(rehab) Recommended Level of Care: Assisted Living Facility Prior Approval Number:    Date Approved/Denied:   PASRR Number:    Discharge Plan: Other (Comment)(ALF)    Current Diagnoses: Patient Active Problem List   Diagnosis Date Noted  . Sleep disturbance   . Elevated BUN   . Labile blood pressure   . Essential hypertension   . Dysphasia, post-stroke   . PAD (peripheral artery disease) (HCC)   . Hypoalbuminemia due to protein-calorie malnutrition (HCC)   . Diabetic peripheral neuropathy (HCC)   . Diabetes mellitus type 2 in nonobese (HCC)   . Chronic diastolic congestive heart failure (HCC)   . Left thalamic infarction (HCC) 02/26/2018  . Dysphagia, post-stroke   . Acute on chronic diastolic congestive heart failure (HCC)   . Type 2 diabetes mellitus with peripheral neuropathy (HCC)   . TIA (transient ischemic attack) 02/22/2018  . Labile blood glucose   . Hypotension due to drugs   . Hyponatremia   . Renovascular hypertension   . Hypertensive crisis   . Type 2 diabetes mellitus with diabetic autonomic neuropathy, without long-term current use of insulin (HCC)   . Stage 3 chronic kidney disease (HCC)   . Acute lower UTI   . Acute renal insufficiency 09/27/2016  . Urinary urgency 09/27/2016  . Basal ganglia infarction (HCC) 09/26/2016  . Neuropathic pain   . Benign essential HTN   . Slow transit constipation   . Elevated creatine  kinase   . Accelerated hypertension 09/24/2016  . Diabetes mellitus (HCC) 09/24/2016  . Hyperlipidemia 09/24/2016  . Sick sinus syndrome (HCC) 09/24/2016  . CVA (cerebral vascular accident) (HCC) 09/23/2016  . Stroke (cerebrum) (HCC) 09/23/2016    Orientation RESPIRATION BLADDER Height & Weight     Self, Time, Situation, Place  Normal Continent, Incontinent(wears brief) Weight: 164 lb (74.4 kg) Height:   (165.1 cm)  BEHAVIORAL SYMPTOMS/MOOD NEUROLOGICAL BOWEL NUTRITION STATUS      Continent Diet(mechanical soft diet)  AMBULATORY STATUS COMMUNICATION OF NEEDS Skin   Min-mod level mostly wheelchairlevel Verbally Normal                       Personal Care Assistance Level of Assistance  Bathing, Dressing Bathing Assistance: Limited assistance   Dressing Assistance: Limited assistance     Functional Limitations Info  Sight Sight Info: Impaired        SPECIAL CARE FACTORS FREQUENCY  PT (By licensed PT), OT (By licensed OT)     PT Frequency: 3x week OT Frequency: 3x week            Contractures Contractures Info: Not present    Additional Factors Info  Code Status, Allergies Code Status Info: DNR Allergies Info: Statins, Amlodipine, Celecoxib, Naproxen Piroxicam           Current Medications (03/11/2018):  This is the current hospital active medication list Current Facility-Administered Medications  Medication Dose Route Frequency Provider Last Rate Last  Dose  . acetaminophen (TYLENOL) tablet 650 mg  650 mg Oral Q4H PRN Charlton Amor, PA-C   650 mg at 03/10/18 0272   Or  . acetaminophen (TYLENOL) solution 650 mg  650 mg Per Tube Q4H PRN Charlton Amor, PA-C   650 mg at 03/09/18 5366   Or  . acetaminophen (TYLENOL) suppository 650 mg  650 mg Rectal Q4H PRN Angiulli, Mcarthur Rossetti, PA-C      . albuterol (PROVENTIL) (2.5 MG/3ML) 0.083% nebulizer solution 2.5 mg  2.5 mg Nebulization Q4H PRN Angiulli, Mcarthur Rossetti, PA-C      . amLODipine (NORVASC) tablet  5 mg  5 mg Oral BID AngiulliMcarthur Rossetti, PA-C   5 mg at 03/11/18 4403  . aspirin EC tablet 81 mg  81 mg Oral Daily Charlton Amor, PA-C   81 mg at 03/11/18 0858  . clopidogrel (PLAVIX) tablet 75 mg  75 mg Oral Daily Charlton Amor, PA-C   75 mg at 03/11/18 0858  . ezetimibe (ZETIA) tablet 10 mg  10 mg Oral Daily Charlton Amor, PA-C   10 mg at 03/11/18 0858  . feeding supplement (PRO-STAT SUGAR FREE 64) liquid 30 mL  30 mL Oral BID Marcello Fennel, MD   30 mL at 03/11/18 0858  . glimepiride (AMARYL) tablet 1 mg  1 mg Oral Q breakfast Kirsteins, Victorino Sparrow, MD   1 mg at 03/11/18 0858  . guaiFENesin (MUCINEX) 12 hr tablet 600 mg  600 mg Oral BID Charlton Amor, PA-C   600 mg at 03/11/18 4742  . insulin aspart (novoLOG) injection 0-5 Units  0-5 Units Subcutaneous QHS Erick Colace, MD   2 Units at 03/02/18 2150  . insulin aspart (novoLOG) injection 0-9 Units  0-9 Units Subcutaneous TID WC Kirsteins, Victorino Sparrow, MD   1 Units at 03/10/18 1737  . lisinopril (PRINIVIL,ZESTRIL) tablet 15 mg  15 mg Oral BID Marcello Fennel, MD   15 mg at 03/11/18 0858  . metoprolol succinate (TOPROL-XL) 24 hr tablet 100 mg  100 mg Oral Daily Charlton Amor, PA-C   100 mg at 03/11/18 5956  . ondansetron (ZOFRAN) tablet 4 mg  4 mg Oral Q6H PRN Angiulli, Mcarthur Rossetti, PA-C       Or  . ondansetron [facility]) injection 4 mg  4 mg Intravenous Q6H PRN Angiulli, Mcarthur Rossetti, PA-C      . pantoprazole (PROTONIX) EC tablet 20 mg  20 mg Oral Daily Marcello Fennel, MD   20 mg at 03/11/18 0859  . RESOURCE THICKENUP CLEAR   Oral PRN Angiulli, Mcarthur Rossetti, PA-C      . senna-docusate (Senokot-S) tablet 1 tablet  1 tablet Oral QHS PRN Angiulli, Mcarthur Rossetti, PA-C      . sorbitol 70 % solution 30 mL  30 mL Oral Daily PRN Angiulli, Mcarthur Rossetti, PA-C      . traZODone (DESYREL) tablet 25 mg  25 mg Oral QHS PRN Ranelle Oyster, MD   25 mg at 03/07/18 2115  . traZODone (DESYREL) tablet 25 mg  25 mg Oral QHS Marcello Fennel, MD   25 mg  at 03/10/18 2147     Discharge Medications: Please see discharge summary for a list of discharge medications.  Relevant Imaging Results:  Relevant Lab Results:   Additional Information SSN: [id number]  Kollyn Lingafelter, Lemar Livings, LCSW

## 2018-03-11 NOTE — Plan of Care (Signed)
  Problem: RH Bed to Chair Transfers Goal: LTG Patient will perform bed/chair transfers w/assist (PT) Description LTG: Patient will perform bed/chair transfers with assistance, with/without cues (PT). Flowsheets (Taken 03/11/2018 1540) LTG: Pt will perform Bed to Chair Transfers with/without cues and assist: : 4 - Minimal Assistance (downgrade d/t slow progress) Note:  Downgrade d/t slow progress   Problem: RH Furniture Transfers Goal: LTG Patient will perform furniture transfers w/assist (OT/PT Description LTG: Patient will perform furniture transfers  with assistance (OT/PT). Flowsheets (Taken 03/11/2018 1540) LTG: Pt will perform furniture transfers with assist:: Minimal assistance (downgrade d/t slow progress) Note:  Downgrade d/t slow progress   Problem: RH Ambulation Goal: LTG Patient will ambulate in controlled environment (PT) Description LTG: Patient will ambulate in a controlled environment, # of feet with assistance (PT). Flowsheets (Taken 03/11/2018 1540) LTG: Pt will ambulate in controlled environ  assist needed:: 4 - Minimal Assistance (downgrade d/t slow progress) Note:  Downgrade d/t slow progress Goal: LTG Patient will ambulate in home environment (PT) Description LTG: Patient will ambulate in home environment, # of feet with assistance (PT). Flowsheets (Taken 03/11/2018 1540) LTG: Pt will ambulate in home environ  assist needed:: 4 - Minimal Assistance (downgrade d/t slow progress) Note:  Downgrade d/t slow progress

## 2018-03-11 NOTE — Progress Notes (Signed)
[place] PHYSICAL MEDICINE & REHABILITATION     PROGRESS NOTE  Subjective/Complaints:  Pt seen sitting up in bed this AM.  She states she slept better overnight.  She would like to know when she can go home.  She does not recall Vascular eval yesterday.  ROS: Denies CP, SOB, N/V/D.  Objective: Vital Signs: Blood pressure (!) 171/63, pulse 63, temperature 98 F (36.7 C), temperature source Oral, resp. rate 17, height  (1.651 m), weight 74.4 kg (164 lb), SpO2 98 %. No results found. No results for input(s): WBC, HGB, HCT, PLT in the last 72 hours. No results for input(s): NA, K, CL, GLUCOSE, BUN, CREATININE, CALCIUM in the last 72 hours.  Invalid input(s): CO CBG (last 3)  Recent Labs    03/10/18 1653 03/10/18 2117 03/11/18 0648  GLUCAP 149* 155* 107*    Wt Readings from Last 3 Encounters:  03/11/18 74.4 kg (164 lb)  02/22/18 75.9 kg (167 lb 5.3 oz)  09/26/16 82.4 kg (181 lb 9.6 oz)    Physical Exam:  BP (!) 171/63   Pulse 63   Temp 98 F (36.7 C) (Oral)   Resp 17   Ht  (1.651 m)   Wt 74.4 kg (164 lb)   SpO2 98%   BMI 27.29 kg/m  Constitutional: No distress . Vital signs reviewed. HENT: Normocephalic, atraumatic.  Eyes: EOMI. No discharge.  Cardiovascular: RRR. No JVD    Respiratory: CTA Bilaterally. Normal effort    GI: BS +, non-distended  Musculoskeletal: B/l LE TTP, ?improved when distracted Neurological: She is alert.  Made good eye contact with examiner.   Fair awareness of deficits.   Follow commands.   Motor: RUE/RLE: 4/5 proximal to distal LUE/LLE: 4/5 proximal to distal (stable) Skin: Skin is warm and dry Psychiatric: She has a normal mood and affect. Her behavior is normal.   Assessment/Plan: 1. Functional deficits secondary to left thalamic left occipital infarcts as well as intracranial stenosis as well as history of CVA 2017 which require 3+ hours per day of interdisciplinary therapy in a comprehensive inpatient rehab  setting. Physiatrist is providing close team supervision and 24 hour management of active medical problems listed below. Physiatrist and rehab team continue to assess barriers to discharge/monitor patient progress toward functional and medical goals.  Function:  Bathing Bathing position   Position: Shower  Bathing parts Body parts bathed by patient: Right arm, Left arm, Chest, Abdomen, Front perineal area, Right upper leg, Left upper leg, Left lower leg, Right lower leg, Buttocks Body parts bathed by helper: Back  Bathing assist Assist Level: Touching or steadying assistance(Pt > 75%)      Upper Body Dressing/Undressing Upper body dressing   What is the patient wearing?: Bra, Pull over shirt/dress Bra - Perfomed by patient: Thread/unthread left bra strap, Thread/unthread right bra strap Bra - Perfomed by helper: Hook/unhook bra (pull down sports bra) Pull over shirt/dress - Perfomed by patient: Thread/unthread right sleeve, Put head through opening, Thread/unthread left sleeve Pull over shirt/dress - Perfomed by helper: Pull shirt over trunk        Upper body assist Assist Level: Touching or steadying assistance(Pt > 75%)      Lower Body Dressing/Undressing Lower body dressing   What is the patient wearing?: Pants, Socks, Shoes Underwear - Performed by patient: Thread/unthread right underwear leg, Thread/unthread left underwear leg Underwear - Performed by helper: Pull underwear up/down Pants- Performed by patient: Thread/unthread right pants leg Pants- Performed by helper: Thread/unthread left  pants leg, Pull pants up/down   Non-skid slipper socks- Performed by helper: Don/doff right sock, Don/doff left sock Socks - Performed by patient: Don/doff right sock Socks - Performed by helper: Don/doff left sock Shoes - Performed by patient: Don/doff right shoe, Don/doff left shoe Shoes - Performed by helper: Don/doff right shoe, Don/doff left shoe   AFO - Performed by helper:  Don/doff right AFO      Lower body assist Assist for lower body dressing: Touching or steadying assistance (Pt > 75%)      Toileting Toileting   Toileting steps completed by patient: Performs perineal hygiene Toileting steps completed by helper: Adjust clothing prior to toileting, Performs perineal hygiene, Adjust clothing after toileting Toileting Assistive Devices: Grab bar or rail  Toileting assist Assist level: Touching or steadying assistance (Pt.75%)   Transfers Chair/bed transfer   Chair/bed transfer method: Stand pivot Chair/bed transfer assist level: Moderate assist (Pt 50 - 74%/lift or lower) Chair/bed transfer assistive device: Armrests, Patent attorney     Max distance: 50 Assist level: Touching or steadying assistance (Pt > 75%)   Wheelchair Wheelchair activity did not occur: Safety/medical concerns   Max wheelchair distance: 100 Assist Level: Touching or steadying assistance (Pt > 75%)  Cognition Comprehension Comprehension assist level: Understands basic 75 - 89% of the time/ requires cueing 10 - 24% of the time  Expression Expression assist level: Expresses basic 75 - 89% of the time/requires cueing 10 - 24% of the time. Needs helper to occlude trach/needs to repeat words.  Social Interaction Social Interaction assist level: Interacts appropriately 75 - 89% of the time - Needs redirection for appropriate language or to initiate interaction.  Problem Solving Problem solving assist level: Solves basic 75 - 89% of the time/requires cueing 10 - 24% of the time  Memory Memory assist level: Recognizes or recalls 75 - 89% of the time/requires cueing 10 - 24% of the time    Medical Problem List and Plan: 1.  Gait disorder with dysphagia secondary to left thalamic left occipital infarcts as well as intracranial stenosis on 4/13 as well as history of CVA 2017  Cont CIR  2.  DVT Prophylaxis/Anticoagulation: SCDs.     Vascular study neg for DVT, however,  suggestive of Baker's cyst, with edema and femoral artery calcification 3. Pain Management: Tylenol as needed  Ice prn  Right foot film reviewed, unremarkable 4. Mood: Provide emotional support 5. Neuropsych: This patient is ?fully capable of making decisions on her own behalf. 6. Skin/Wound Care: Routine skin checks 7. Fluids/Electrolytes/Nutrition: Routine I/O's    BMP within acceptable range on 4/26, however BUN trending up  Encourage fluids  Labs ordered 8.  Dysphagia.  Dysphasia #3 nectar liquids.  Follow-up speech therapy   Advance diet as tolerated 9.  Diastolic congestive heart failure.  Monitor for any signs of fluid overload Filed Weights   03/09/18 0329 03/10/18 0614 03/11/18 0439  Weight: 73.1 kg (161 lb 2.5 oz) 74.3 kg (163 lb 12.8 oz) 74.4 kg (164 lb)   Stable on 4/29  10.  Sick sinus syndrome status post pacemaker.  Follow-up per cardiology services 11.  Diabetes mellitus peripheral neuropathy.  Hemoglobin A1c 6.9.  SSI.  Check blood sugars before meals and at bedtime.    Patient on Amaryl 2 mg daily prior to admission.  Resume as needed   Amaryl resumed on 4/20 CBG (last 3)  Recent Labs    03/10/18 1653 03/10/18 2117 03/11/18 4098  GLUCAP 149* 155* 107*   Relatively controlled on 4/30 12.  Hypertension.  Toprol-XL 100 mg daily, Norvasc 5 mg twice daily.      Vitals:   03/10/18 1402 03/10/18 2036  BP: (!) 152/66 (!) 171/63  Pulse: 63   Resp: 17   Temp: 98 F (36.7 C)   SpO2: 98%    Lisinopril 5 started on 4/23, increased to twice a day on 4/25, increased to 10 mg twice a day on 4/26, increased to 15 BID on 4/29  Elevated on 4/30 13.  Hyperlipidemia.  Zetia 14. Hypoalbuminemia  Supplement initiated on 4/15  15. PAD  Has fairly consistent pain in BLE with partial relief using tylenol, fem art calcification likely chronic  Patient now amenable to further work-up  Appreciate Vasc recs, imaging ordered 16. Sleep disturbance  Low dose trazodone started on  4/29  Improving  LOS (Days) 13 A FACE TO FACE EVALUATION WAS PERFORMED  Jordy Hewins Karis Juba 03/11/2018 8:23 AM

## 2018-03-11 NOTE — Progress Notes (Signed)
Physical Therapy Session Note  Patient Details  Name: Charlene Greene MRN: [id number] Date of Birth: [birth year]  Today's Date: 03/11/2018 PT Individual Time: 1100-1140 PT Individual Time Calculation (min): 40 min   Short Term Goals: Week 2:  PT Short Term Goal 1 (Week 2): =LTG due to estimated LOS  Skilled Therapeutic Interventions/Progress Updates: Tx1: Pt received seated in w/c, denies pain but reports "I'm so tired I cant hold my eyes open"; agreeable to participation with encouragement. Transported dependently to gym. Sit >stand modA from w/c with RW. Gait x10' with significantly slowed gait speed, pt frequently distracted by surroundings and decreased motivation, frequently asking therapist if she can stop. BLE w/c propulsion 2x75' for strengthening and aerobic endurance. Pt asking why her arms feel so weak; educated pt on history of and location of previous stroke, in addition to new CVA affecting opposite side; suspect she was able to compensate for L sided weakness before but deficits are now more apparent d/t new R sided weakness as well as general deconditioning. Ambulatory transfer x5' with RW and min/modA to bed. Sit >supine with S and increased time. Remained supine in bed, all needs in reach, alarm intact.   Tx 2: Pt received in bed, denies pain but reporting significant fatigue. Encouraged pt participation including offering to help pt to restroom which she reported she needed to do, however pt declines stating she does not need to go yet and is not ready to get out of bed. Will continue per POC as pt able to tolerate.      Therapy Documentation Precautions:  Precautions Precautions: Fall Precaution Comments: L hemiparesis Restrictions Weight Bearing Restrictions: No General: PT Amount of Missed Time (min): 20 Minutes and 30 min (total 50 min) PT Missed Treatment Reason: Patient fatigue Pain: Pain Assessment Pain Scale: 0-10 Pain Score: 0-No pain   See Function Navigator  for Current Functional Status.   Therapy/Group: Individual Therapy  Vista Lawman 03/11/2018, 11:42 AM

## 2018-03-11 NOTE — Progress Notes (Signed)
Speech Language Pathology Daily Session Note  Patient Details  Name: Charlene Greene MRN: [id number] Date of Birth: 1922-07-05  Today's Date: 03/11/2018 SLP Individual Time: 0804-0900 SLP Individual Time Calculation (min): 56 min  Short Term Goals: Week 2: SLP Short Term Goal 1 (Week 2): Pt will consume regular textures and thin liquids with mod I use of swallowing precautions and no overt s/s of aspiration.  SLP Short Term Goal 2 (Week 2): Pt will selectively attend to tasks in a mildly distracting environment for 45 minutes with mod I redirection to task.  SLP Short Term Goal 3 (Week 2): Pt will recall daily information with supervision verbal cues for use of external aids.   SLP Short Term Goal 4 (Week 2): Pt will complete mildly complex tasks with supervision cues for functional problem solving.    Skilled Therapeutic Interventions:  Pt was seen for skilled ST targeting goals for cognition and dysphagia.  Pt had been incontinent upon therapist's arrival but had not called to be changed into a clean brief.  Discussed the need for timely hygiene to prevent skin breakdown and assisted in donning clean brief.  Pt was then transferred to wheelchair and completed basic sinkside ADLs with mod I in a moderately distracting environment.  Pt consumed thin liquids with mod I use of swallowing precautions and no overt s/s of aspiration.  Pt reports improving appetite but still has decreased intake of liquids.  Discussed need for adequate hydration to prevent infection and need for IVF.  Pt verbalized understanding.  Pt requested to use the bathroom to have a bowel movement and was transferred to [facility] with assistance from RN.  Max cues needed for safety during transfer due to distractibility.  Pt left in care of RN.  Continue per current plan of care.    Function:  Eating Eating   Modified Consistency Diet: Yes Eating Assist Level: More than reasonable amount of time            Cognition Comprehension Comprehension assist level: Follows basic conversation/direction with extra time/assistive device  Expression   Expression assist level: Expresses basic needs/ideas: With extra time/assistive device  Social Interaction Social Interaction assist level: Interacts appropriately 90% of the time - Needs monitoring or encouragement for participation or interaction.  Problem Solving Problem solving assist level: Solves basic 75 - 89% of the time/requires cueing 10 - 24% of the time  Memory Memory assist level: Recognizes or recalls 75 - 89% of the time/requires cueing 10 - 24% of the time    Pain Pain Assessment Pain Scale: 0-10 Pain Score: 0-No pain  Therapy/Group: Individual Therapy  Karleen Seebeck, Melanee Spry 03/11/2018, 12:11 PM

## 2018-03-12 ENCOUNTER — Inpatient Hospital Stay (HOSPITAL_COMMUNITY): Payer: Medicare Other | Admitting: Physical Therapy

## 2018-03-12 ENCOUNTER — Inpatient Hospital Stay (HOSPITAL_COMMUNITY): Payer: Medicare Other | Admitting: Speech Pathology

## 2018-03-12 ENCOUNTER — Inpatient Hospital Stay (HOSPITAL_COMMUNITY): Payer: Medicare Other | Admitting: Occupational Therapy

## 2018-03-12 LAB — GLUCOSE, CAPILLARY
GLUCOSE-CAPILLARY: 115 mg/dL — AB (ref 65–99)
GLUCOSE-CAPILLARY: 97 mg/dL (ref 65–99)
Glucose-Capillary: 115 mg/dL — ABNORMAL HIGH (ref 65–99)
Glucose-Capillary: 199 mg/dL — ABNORMAL HIGH (ref 65–99)

## 2018-03-12 LAB — BASIC METABOLIC PANEL
ANION GAP: 5 (ref 5–15)
BUN: 19 mg/dL (ref 6–20)
CALCIUM: 8.8 mg/dL — AB (ref 8.9–10.3)
CHLORIDE: 107 mmol/L (ref 101–111)
CO2: 32 mmol/L (ref 22–32)
Creatinine, Ser: 0.66 mg/dL (ref 0.44–1.00)
GFR calc non Af Amer: >60 mL/min (ref >60)
GLUCOSE: 115 mg/dL — AB (ref 65–99)
Potassium: 4 mmol/L (ref 3.5–5.1)
Sodium: 144 mmol/L (ref 135–145)

## 2018-03-12 MED ORDER — LISINOPRIL 20 MG PO TABS
20.0000 mg | ORAL_TABLET | Freq: Two times a day (BID) | ORAL | Status: DC
  Administered 2018-03-12 – 2018-03-13 (×2): 20 mg via ORAL
  Filled 2018-03-12 (×2): qty 1

## 2018-03-12 MED ORDER — LISINOPRIL 5 MG PO TABS
5.0000 mg | ORAL_TABLET | Freq: Once | ORAL | Status: AC
  Administered 2018-03-12: 5 mg via ORAL
  Filled 2018-03-12: qty 1

## 2018-03-12 NOTE — Discharge Instructions (Signed)
Inpatient Rehab Discharge Instructions  Charlene Greene Discharge date and time: No discharge date for patient encounter.   Activities/Precautions/ Functional Status: Activity: activity as tolerated Diet: diabetic diet Wound Care: none needed Functional status:  ___ No restrictions     ___ Walk up steps independently ___ 24/7 supervision/assistance   ___ Walk up steps with assistance ___ Intermittent supervision/assistance  ___ Bathe/dress independently ___ Walk with walker     _x__ Bathe/dress with assistance ___ Walk Independently    ___ Shower independently ___ Walk with assistance    ___ Shower with assistance ___ No alcohol     ___ Return to work/school ________  Special Instructions: Plan is for aspirin and Plavix x3 months total and then aspirin alone thereafter   COMMUNITY REFERRALS UPON DISCHARGE:   Home Health:   PT & OT  Agency:[facility]   Phone:[phone]   Date of last service:03/13/2018  Medical Equipment/Items Ordered:3 IN1  Agency/Supplier:[facility]  Other:RETURNING TO [place]     STROKE/TIA DISCHARGE INSTRUCTIONS SMOKING Cigarette smoking nearly doubles your risk of having a stroke & is the single most alterable risk factor  If you smoke or have smoked in the last 12 months, you are advised to quit smoking for your health.  Most of the excess cardiovascular risk related to smoking disappears within a year of stopping.  Ask you doctor about anti-smoking medications  [place] Quit Line: 1-800-QUIT NOW  Free Smoking Cessation Classes [phone]  CHOLESTEROL Know your levels; limit fat & cholesterol in your diet  Lipid Panel     Component Value Date/Time   CHOL 292 (H) 02/24/2018 0349   TRIG 330 (H) 02/24/2018 0349   HDL 39 (L) 02/24/2018 0349   CHOLHDL 7.5 02/24/2018 0349   VLDL 66 (H) 02/24/2018 0349   LDLCALC 187 (H) 02/24/2018 0349      Many patients benefit from treatment even if their cholesterol is at goal.  Goal:  Total Cholesterol (CHOL) less than 160  Goal:  Triglycerides (TRIG) less than 150  Goal:  HDL greater than 40  Goal:  LDL (LDLCALC) less than 100   BLOOD PRESSURE American Stroke Association blood pressure target is less that 120/80 mm/Hg  Your discharge blood pressure is:  BP: (!) 161/86  Monitor your blood pressure  Limit your salt and alcohol intake  Many individuals will require more than one medication for high blood pressure  DIABETES (A1c is a blood sugar average for last 3 months) Goal HGBA1c is under 7% (HBGA1c is blood sugar average for last 3 months)  Diabetes:   Lab Results  Component Value Date   HGBA1C 6.9 (H) 02/24/2018     Your HGBA1c can be lowered with medications, healthy diet, and exercise.  Check your blood sugar as directed by your physician  Call your physician if you experience unexplained or low blood sugars.  PHYSICAL ACTIVITY/REHABILITATION Goal is 30 minutes at least 4 days per week  Activity: Increase activity slowly, Therapies: Physical Therapy: Home Health Return to work:   Activity decreases your risk of heart attack and stroke and makes your heart stronger.  It helps control your weight and blood pressure; helps you relax and can improve your mood.  Participate in a regular exercise program.  Talk with your doctor about the best form of exercise for you (dancing, walking, swimming, cycling).  DIET/WEIGHT Goal is to maintain a healthy weight  Your discharge diet is: Fall precautions Diet Heart Room service appropriate? Yes;  Fluid consistency: Thin  liquids Your height is:  Height:  (165.1 cm) Your current weight is: Weight: 74.3 kg (163 lb 12.8 oz) Your Body Mass Index (BMI) is:  BMI (Calculated): 27.26  Following the type of diet specifically designed for you will help prevent another stroke.  Your goal weight range is:    Your goal Body Mass Index (BMI) is 19-24.  Healthy food habits can help reduce 3 risk factors for stroke:   High cholesterol, hypertension, and excess weight.  RESOURCES Stroke/Support Group:  Call [phone]   STROKE EDUCATION PROVIDED/REVIEWED AND GIVEN TO PATIENT Stroke warning signs and symptoms How to activate emergency medical system (call 911). Medications prescribed at discharge. Need for follow-up after discharge. Personal risk factors for stroke. Pneumonia vaccine given:  Flu vaccine given:  My questions have been answered, the writing is legible, and I understand these instructions.  I will adhere to these goals & educational materials that have been provided to me after my discharge from the hospital.      My questions have been answered and I understand these instructions. I will adhere to these goals and the provided educational materials after my discharge from the hospital.  Patient/Caregiver Signature _______________________________ Date __________  Clinician Signature _______________________________________ Date __________  Please bring this form and your medication list with you to all your follow-up doctor's appointments.

## 2018-03-12 NOTE — Discharge Summary (Signed)
Charlene Greene, Charlene Greene                 ACCOUNT NO.:  [id number]  MEDICAL RECORD NO.:  [id number]  LOCATION:                                 FACILITY:  PHYSICIAN:  Maryla Morrow, MD        DATE OF BIRTH:  [birth year]  DATE OF ADMISSION:  02/26/2018 DATE OF DISCHARGE:  03/13/2018                              DISCHARGE SUMMARY   DISCHARGE DIAGNOSES: 1. Left thalamic, left occipital infarctions as well as intracranial     stenosis. 2. SCDs for deep venous thrombosis prophylaxis. 3. Pain management. 4. Dysphagia. 5. Diastolic congestive heart failure. 6. Sick sinus syndrome with pacemaker. 7. Diabetes mellitus. 8. Hypertension. 9. Hyperlipidemia. 10.Peripheral vascular disease. 11.Sleep disturbance.  HISTORY OF PRESENT ILLNESS:  This is a 84 year old right-handed female with history of CVA 2017, maintained on aspirin and Plavix; diabetes mellitus; hypertension; pacemaker; and congestive heart failure.  The patient has received inpatient rehab services in the past in November of 2017 for right basal ganglia infarction.  She is a resident of assisted living facility.  She does receive some assistance with bathing and dressing.  Presented on February 22, 2018, with altered mental status. Cranial CT scan unremarkable for acute intracranial process.  MRI showed patchy small-volume acute ischemic nonhemorrhagic infarction involving the left thalamus, left occipital and left temporal lobe, left PCA territory.  MRA of the neck negative.  MRI of the head with extensive atherosclerotic changes.  Severe near occlusion right A2 stenosis, right chronic M2 occlusion.  Echocardiogram with ejection fraction of 70%, grade 1 diastolic dysfunction.  Neurology consulted, maintained on aspirin and Plavix x3 months and then aspirin alone.  Cardiology workup, pacemaker interrogated showing no signs of atrial fibrillation.  She was initially on a dysphagia #3 nectar thick liquid diet.  Complains of hip pain.   X-rays unremarkable.  The patient was admitted for a comprehensive rehab program.  PAST MEDICAL HISTORY:  See discharge diagnoses.  SOCIAL HISTORY:  She is a resident of assisted living facility.  FUNCTIONAL STATUS:  Upon admission to Rehab Services was moderate assist, ambulate 10 feet with rolling walker, minimal assist sit to stand, and min to mod assist activities daily living.  PHYSICAL EXAMINATION:  VITAL SIGNS:  Blood pressure 145/69, pulse 60, temperature 97, and respirations 17. GENERAL:  Alert female, in no acute distress. HEENT:  EOMs intact. NECK:  Supple.  Nontender.  No JVD. CARDIAC:  Rate controlled. ABDOMEN:  Soft, nontender.  Good bowel sounds. LUNGS:  Clear to auscultation without wheeze.  She made good eye contact with examiner with fair awareness of her deficits.  REHABILITATION HOSPITAL COURSE:  The patient was admitted to Inpatient Rehab Services with therapies initiated on a 3-hour daily basis, consisting of physical therapy, occupational therapy, speech therapy, and rehabilitation nursing.  The following issues were addressed during the patient's rehabilitation stay.  Pertaining to Mrs. Shein's left thalamic as well as left occipital infarction remained stable, maintained on aspirin and Plavix therapy, she would follow up with Neurology Services.  SCDs for DVT prophylaxis.  Negative for DVT.  There was some suggestion of edema with femoral artery calcification. Vascular Surgery, Dr. Tawanna Cooler Early was consulted.  ABI completing showing right to be 0.48, left 0.  Workup ongoing.  The patient will continue to be followed on an outpatient basis.  Pain management with use of Tylenol as needed.  Her diet was advanced to a regular consistency.  She exhibited no signs of fluid overload.  She continued on lisinopril as well as Toprol for blood pressure control.  Hemoglobin A1c of 6.9.  She remained on low-dose Amaryl.  She had some sleep disturbance much improved  with the use of trazodone.  The patient received weekly collaborative interdisciplinary team conferences to discuss estimated length of stay, family teaching, any barriers to her discharge.  She ambulate short distances with a rolling walker.  She could propel her wheelchair.  She had been receiving some assistance with ADLs prior to admission at the assisted living facility which would be ongoing at the time of discharge.  She could practice sit to stand, rolling walker, bedside commode, transfers with rolling walker smoothly with contact guard assistance.  Strength and endurance continued to improve and plan was discharge back to assisted living facility on Mar 13, 2018.  DISCHARGE MEDICATIONS:  Included: 1. Norvasc 5 mg p.o. b.i.d. 2. Aspirin 81 mg p.o. daily. 3. Plavix 75 mg p.o. daily. 4. Zetia 10 mg p.o. daily. 5. Amaryl 1 mg p.o. at breakfast. 6. Mucinex 600 mg p.o. b.i.d. 7. Lisinopril 15 mg p.o. b.i.d. 8. Toprol-XL 100 mg p.o. daily. 9. Protonix 20 mg p.o. daily. 10.Trazodone 25 mg p.o. at bedtime. 11.Tylenol as needed. 12.  Fenofibrate 160 mg daily 13.  Magnesium 250 mg p.o. Nightly 14.  Vitamin D 1000 units daily 15.  BuSpar 5 mg twice daily 16.  Imdur 60 mg daily 17.  Ranexa 500 mg twice daily  DIET:  Her diet was a regular.  FOLLOWUP:  She would follow up with Dr. Maryla Morrow at the Outpatient Rehab Service office as directed; Dr. Delia Heady, Neurology Service, call for appointment; Dr. Willeen Cass, medical management; Dr. Gretta Began, Vascular Surgery, call for appointment.     Charlene Greene, P.A.   ______________________________ Maryla Morrow, MD    DA/MEDQ  D:  03/12/2018  T:  03/12/2018  Job:  [id number]  cc:   Pramod P. Pearlean Brownie, MD Maryla Morrow, MD

## 2018-03-12 NOTE — Progress Notes (Signed)
[facility]     PROGRESS NOTE  Subjective/Complaints:  Patient seen sitting up in bed this morning. She states she slept better overnight. She states she would like to go back to the facility today. Vascular performed ABIs yesterday.  ROS: denies CP, SOB, N/V/D.  Objective: Vital Signs: Blood pressure 134 [id number]$5\' 5"4 %. No results found. No results for input(s): WBC, HGB, HCT, PLT in the last 72 hours. Recent Labs    03/12/18 0705  NA 144  K 4.0  CL 107  GLUCOSE 115*  BUN 19  CREATININE 0.66  CALCIUM 8.8*   CBG (last 3)  Recent Labs    03/11/18 1647 03/11/18 2045 03/12/18 0622  GLUCAP 100* 138* 115*    Wt Readings from Last 3 Encounters:  03/12/18 75.1 kg (165 lb 9.1 oz)  02/22/18 75.9 kg (167 lb 5.3 oz)  09/26/16 82.4 kg (181 lb 9.6 oz)    Physical Exam:  BP 134/71   Pulse 67   Temp 98 F (36.7 C) (Oral)   Resp 18   Ht  (1.651 m)   Wt 75.1 kg (165 lb 9.1 oz)   SpO2 94%   BMI 27.55 kg/m  Constitutional: No distress . Vital signs reviewed. HENT: Normocephalic, atraumatic.  Eyes: EOMI. No discharge.  Cardiovascular: RRR. No JVD    Respiratory: CTA Bilaterally. Normal effort    GI: BS +, non-distended  Musculoskeletal: B/l LE TTP, ?improved when attention shifted Neurological: She is alert.  Made good eye contact with examiner.   Fair awareness of deficits.   Follow commands.   Motor: RUE/RLE: 4/5 proximal to distal LUE/LLE: 4/5 proximal to distal (unchanged) Skin: Skin is warm and dry Psychiatric: She has a normal mood and affect. Her behavior is normal.   Assessment/Plan: 1. Functional deficits secondary to left thalamic left occipital infarcts as well as intracranial stenosis as well as history of CVA 2017 which require 3+ hours per day of interdisciplinary therapy in a  comprehensive inpatient rehab setting. Physiatrist is providing close team supervision and 24 hour management of active medical problems listed below. Physiatrist and rehab team continue to assess barriers to discharge/monitor patient progress toward functional and medical goals.  Function:  Bathing Bathing position   Position: Shower  Bathing parts Body parts bathed by patient: Right arm, Left arm, Chest, Abdomen, Front perineal area, Right upper leg, Left upper leg, Left lower leg, Right lower leg, Buttocks Body parts bathed by helper: Back  Bathing assist Assist Level: Touching or steadying assistance(Pt > 75%)      Upper Body Dressing/Undressing Upper body dressing   What is the patient wearing?: Bra, Pull over shirt/dress Bra - Perfomed by patient: Thread/unthread left bra strap, Thread/unthread right bra strap Bra - Perfomed by helper: Hook/unhook bra (pull down sports bra) Pull over shirt/dress - Perfomed by patient: Thread/unthread right sleeve, Put head through opening, Thread/unthread left sleeve, Pull shirt over trunk Pull over shirt/dress - Perfomed by helper: Pull shirt over trunk        Upper body assist Assist Level: Touching or steadying assistance(Pt > 75%)      Lower Body Dressing/Undressing Lower body dressing   What is the patient wearing?: Pants, Socks, Shoes Underwear - Performed by patient: Thread/unthread right underwear leg, Thread/unthread left underwear leg Underwear - Performed by helper: Pull underwear  up/down Pants- Performed by patient: Thread/unthread right pants leg, Thread/unthread left pants leg Pants- Performed by helper: Pull pants up/down   Non-skid slipper socks- Performed by helper: Don/doff right sock, Don/doff left sock Socks - Performed by patient: Don/doff right sock Socks - Performed by helper: Don/doff left sock, Don/doff right sock Shoes - Performed by patient: Don/doff right shoe, Don/doff left shoe Shoes - Performed by helper:  Don/doff right shoe, Don/doff left shoe   AFO - Performed by helper: Don/doff right AFO      Lower body assist Assist for lower body dressing: Touching or steadying assistance (Pt > 75%)      Toileting Toileting   Toileting steps completed by patient: Performs perineal hygiene Toileting steps completed by helper: Adjust clothing prior to toileting, Performs perineal hygiene, Adjust clothing after toileting Toileting Assistive Devices: Grab bar or rail  Toileting assist Assist level: Touching or steadying assistance (Pt.75%)   Transfers Chair/bed transfer   Chair/bed transfer method: Ambulatory Chair/bed transfer assist level: Moderate assist (Pt 50 - 74%/lift or lower) Chair/bed transfer assistive device: Armrests, Patent attorney     Max distance: 10 Assist level: Touching or steadying assistance (Pt > 75%)   Wheelchair Wheelchair activity did not occur: Safety/medical concerns Type: Manual Max wheelchair distance: 75 Assist Level: Touching or steadying assistance (Pt > 75%)  Cognition Comprehension Comprehension assist level: Follows basic conversation/direction with extra time/assistive device  Expression Expression assist level: Expresses basic needs/ideas: With extra time/assistive device  Social Interaction Social Interaction assist level: Interacts appropriately 90% of the time - Needs monitoring or encouragement for participation or interaction.  Problem Solving Problem solving assist level: Solves basic 75 - 89% of the time/requires cueing 10 - 24% of the time  Memory Memory assist level: Recognizes or recalls 75 - 89% of the time/requires cueing 10 - 24% of the time    Medical Problem List and Plan: 1.  Gait disorder with dysphagia secondary to left thalamic left occipital infarcts as well as intracranial stenosis on 4/13 as well as history of CVA 2017  Cont CIR  Plan for DC tomorrow  2.  DVT Prophylaxis/Anticoagulation: SCDs.     Vascular study  neg for DVT, however, suggestive of Baker's cyst, with edema and femoral artery calcification 3. Pain Management: Tylenol as needed  Ice prn  Right foot film reviewed, unremarkable 4. Mood: Provide emotional support 5. Neuropsych: This patient is ?fully capable of making decisions on her own behalf. 6. Skin/Wound Care: Routine skin checks 7. Fluids/Electrolytes/Nutrition: Routine I/O's    BMP within acceptable range on 5/1with BUN that continues to trend up  Encourage fluids  Recommend outpatient follow-up 8.  Dysphagia.  Dysphasia #3 nectar liquids.  Follow-up speech therapy   Advance diet as tolerated 9.  Diastolic congestive heart failure.  Monitor for any signs of fluid overload Filed Weights   03/10/18 0614 03/11/18 0439 03/12/18 0525  Weight: 74.3 kg (163 lb 12.8 oz) 74.4 kg (164 lb) 75.1 kg (165 lb 9.1 oz)   Stable on 5/1  10.  Sick sinus syndrome status post pacemaker.  Follow-up per cardiology services 11.  Diabetes mellitus peripheral neuropathy.  Hemoglobin A1c 6.9.  SSI.  Check blood sugars before meals and at bedtime.    Patient on Amaryl 2 mg daily prior to admission.  Resume as needed   Amaryl resumed on 4/20 CBG (last 3)  Recent Labs    03/11/18 1647 03/11/18 2045 03/12/18 0622  GLUCAP 100* 138*  115*   Relatively controlled on 5/1 12.  Hypertension.  Toprol-XL 100 mg daily, Norvasc 5 mg twice daily.      Vitals:   03/12/18 0526 03/12/18 0812  BP: (!) 158/70 134/71  Pulse: 61 67  Resp: 18   Temp: 98 F (36.7 C)   SpO2: 94%    Lisinopril 5 started on 4/23, increased to twice a day on 4/25, increased to 10 mg twice a day on 4/26, increased to 15 BID on 4/29, increased to 20 twice a day on 5/1  Extremely labile 5/1 13.  Hyperlipidemia.  Zetia 14. Hypoalbuminemia  Supplement initiated on 4/15  15. PAD  Has fairly consistent pain in BLE with partial relief using tylenol, fem art calcification likely chronic  ABIs performed, left greater than disease, however  not symptomatic at present. No further workup indicated at this time for vascular. 16. Sleep disturbance  Low dose trazodone started on 4/29  Improved  LOS (Days) 14 A FACE TO FACE EVALUATION WAS PERFORMED  Kingslee Mairena Karis Juba 03/12/2018 9:17 AM

## 2018-03-12 NOTE — Patient Care Conference (Signed)
Inpatient RehabilitationTeam Conference and Plan of Care Update Date: 03/12/2018   Time: 2:00 PM    Patient Name: Charlene Greene      Medical Record Number: [id number]  Date of Birth: [birth year] Sex: Female         Room/Bed: 4M09C/4M09C-01 Payor Info: Payor: MEDICARE / Plan: MEDICARE PART A AND B / Product Type: *No Product type* /    Admitting Diagnosis: cva  Admit Date/Time:  02/26/2018  6:47 PM Admission Comments: No comment available   Primary Diagnosis:  <principal problem not specified> Principal Problem: <principal problem not specified>  Patient Active Problem List   Diagnosis Date Noted  . Sleep disturbance   . Elevated BUN   . Labile blood pressure   . Essential hypertension   . Dysphasia, post-stroke   . PAD (peripheral artery disease) (HCC)   . Hypoalbuminemia due to protein-calorie malnutrition (HCC)   . Diabetic peripheral neuropathy (HCC)   . Diabetes mellitus type 2 in nonobese (HCC)   . Chronic diastolic congestive heart failure (HCC)   . Left thalamic infarction (HCC) 02/26/2018  . Dysphagia, post-stroke   . Acute on chronic diastolic congestive heart failure (HCC)   . Type 2 diabetes mellitus with peripheral neuropathy (HCC)   . TIA (transient ischemic attack) 02/22/2018  . Labile blood glucose   . Hypotension due to drugs   . Hyponatremia   . Renovascular hypertension   . Hypertensive crisis   . Type 2 diabetes mellitus with diabetic autonomic neuropathy, without long-term current use of insulin (HCC)   . Stage 3 chronic kidney disease (HCC)   . Acute lower UTI   . Acute renal insufficiency 09/27/2016  . Urinary urgency 09/27/2016  . Basal ganglia infarction (HCC) 09/26/2016  . Neuropathic pain   . Benign essential HTN   . Slow transit constipation   . Elevated creatine kinase   . Accelerated hypertension 09/24/2016  . Diabetes mellitus (HCC) 09/24/2016  . Hyperlipidemia 09/24/2016  . Sick sinus syndrome (HCC) 09/24/2016  . CVA (cerebral vascular  accident) (HCC) 09/23/2016  . Stroke (cerebrum) (HCC) 09/23/2016    Expected Discharge Date: Expected Discharge Date: 03/13/18  Team Members Present: Physician leading conference: Dr. Maryla Morrow Social Worker Present: Dossie Der, LCSW Nurse Present: Allayne Stack, RN PT Present: Midge Minium, PT OT Present: Perrin Maltese, OT PPS Coordinator present : Tora Duck, RN, CRRN     Current Status/Progress Goal Weekly Team Focus  Medical   Gait disorder with dysphagia secondary to left thalamic left occipital infarcts as well as intracranial stenosis on 4/13 as well as history of CVA 2017  Improve mobility, self-care, DM  See above   Bowel/Bladder   Continent and incontinent episodes LBM 03/11/2018, Continue  PRN   pt will be continent of B/B with regular bowel pattern  Assess and address toileting needs QS/PRN ,Provide prn notify MD if no relieve    Swallow/Nutrition/ Hydration   regular textures, thin liquids   mod I   completion of education   ADL's   Pt continues to need min A with bathing, sit to stand, and standing balance with RW; continues to need mod A toileting and toilet transfer and LB dressing, set up with shirt , min A bra  S standing balance and bathing and toileting/ toilet transfer, min A dressing  ADL training, functional mobility, balance   Mobility   S bed mobility, min/modA sit >stand, min guard gait with RW short distances  Downgraded to minA overall d/t slow progress  activity tolerance, LE strengthening, transfer/gait training   Communication             Safety/Cognition/ Behavioral Observations  supervision   supervision   completion of education   Pain   denies need for prn meds for pain, has Tylenol prn if complaint   < 2  QS assessment and prn for generalized pain/disocmfort , Notify MD    Skin   Brusing and scratches to LE's no new evidence of skin integrity issues or concerns , monitor and notify MD    healing scratches no further breakdown of skin   qs  assessment and monitorinh for skin integrity changes      *See Care Plan and progress notes for long and short-term goals.     Barriers to Discharge  Current Status/Progress Possible Resolutions Date Resolved   Physician    Medical stability;Decreased caregiver support;Lack of/limited family support;Weight     See above  Therapies, optimize DM, no further Vasc workup      Nursing                  PT                    OT                  SLP                SW                Discharge Planning/Teaching Needs:  Returning to [facility] arraning more assist than had before. Son aware of this      Team Discussion:  Pt flucuates in her levels, dependent upon her motivation with the task at hand. On regular diet now. MD encouraging fluids. Vascular consult done and have signed off. Slow progress will require 24 hr care upon return to [facility]. Downgraded goals to min assist level  Revisions to Treatment Plan:  DC 5/2    Continued Need for Acute Rehabilitation Level of Care: The patient requires daily medical management by a physician with specialized training in physical medicine and rehabilitation for the following conditions: Daily direction of a multidisciplinary physical rehabilitation program to ensure safe treatment while eliciting the highest outcome that is of practical value to the patient.: Yes Daily medical management of patient stability for increased activity during participation in an intensive rehabilitation regime.: Yes Daily analysis of laboratory values and/or radiology reports with any subsequent need for medication adjustment of medical intervention for : Neurological problems;Diabetes problems;Blood pressure problems;Other  Lucy Chris 03/12/2018, 4:17 PM

## 2018-03-12 NOTE — Progress Notes (Signed)
Occupational Therapy Discharge Summary  Patient Details  Name: Charlene Greene MRN: [id number] Date of Birth: [birth year]  Today's Date: 03/12/2018 OT Individual Time: 6808-8110 OT Individual Time Calculation (min): 68 min    Session Note:  Pt worked on sit to stand transitions at the high/low table, while engaged in completion of 25 piece puzzle.  She needed mod assist for sit to stand with mod demonstrational cueing for sequencing scooting and for hand placement with standing.  She was only able to tolerate standing for 1 minute intervals or less secondary to fatigue and LE weakness.  She would sit down abrubtly, without control secondary to the LEs giving out.  She completed several intervals before returning to the room to complete toilet transfer.  Min assist for sit to stand from the wheelchair and for turning to the 3:1 over the toilet.  Mod assist for clothing management with pt exhibiting bladder incontinence in her brief.  Max assist for changing brief with mod assist to pull pants over hips.  She transferred back to the wheelchair with min assist and then back to the bed.  Min assist for transfer to the bed and for transition to supine.  Pt left in room with family members, representative from the ALF, and SW.    Patient has met 7 of 10 long term goals due to improved balance and ability to compensate for deficits.  Patient to discharge at overall Mod Assist level.  Patient's care partner is independent to provide the necessary physical and cognitive assistance at discharge.    Reasons goals not met: Pt continues to need supervision for dynamic sitting balance as well as mod assist for UB dressing and dynamic standing balance.    Recommendation:  Patient will benefit from ongoing skilled OT services in ALF to continue to advance functional skills in the area of BADL.  Pt's progress fluctuated with this admission.  She can complete functional transfers at a min to mod assist level, but still  demonstrates decreased overall endurance with standing and functional mobility.  At times her endurance allows her to stand for 1-1.5 mins and then her LEs begin to weaken and she sits immediately.  She states that she can feel them giving out, but cannot stop it.  Transfers currently are limited to stand pivot with the RW secondary to not being able to safely ambulate distances because of the weakness.  She also will exhibit decreased ability to advance the RLE as well, as she fatigues leading to insecurity, forward trunk flexion, and the need to sit down immediately.  Feel she can return to ALF but it has been expressed that she will need 24 hour min to mod assist at this time with recommendation of continued OT.      Equipment: No equipment provided  Reasons for discharge: treatment goals met and discharge from hospital  Patient/family agrees with progress made and goals achieved: Yes  OT Discharge Precautions/Restrictions  Precautions Precautions: Fall Precaution Comments: L hemiparesis, RLE buckles occasionally Restrictions Weight Bearing Restrictions: No  Pain Pain Assessment Pain Scale: Faces Faces Pain Scale: Hurts a little bit Pain Type: Acute pain Pain Location: Leg Pain Orientation: Right;Left Pain Descriptors / Indicators: Discomfort Pain Intervention(s): Repositioned ADL  See Function Section of chart  Vision Baseline Vision/History: Wears glasses Wears Glasses: At all times Patient Visual Report: No change from baseline Eye Alignment: Within Functional Limits Ocular Range of Motion: Restricted looking up Alignment/Gaze Preference: Within Defined Limits Convergence: Within functional limits Visual Fields:  No apparent deficits Perception  Perception: Within Functional Limits Praxis Praxis: Impaired Praxis Impairment Details: Ideomotor Praxis-Other Comments: Pt with difficulty at times advancing the RLE during mobility.  Motor planning secondary to  weakness Cognition Overall Cognitive Status: Within Functional Limits for tasks assessed Arousal/Alertness: Awake/alert Orientation Level: Oriented X4 Attention: Sustained Sustained Attention: Appears intact Selective Attention: Appears intact Selective Attention Impairment: Functional basic;Verbal basic Memory: Appears intact Memory Impairment: Decreased recall of new information Awareness: Appears intact Awareness Impairment: Emergent impairment Problem Solving: Impaired Problem Solving Impairment: Functional basic;Functional complex Safety/Judgment: Appears intact Sensation Sensation Light Touch: Appears Intact Stereognosis: Not tested Hot/Cold: Not tested Proprioception: Appears Intact Coordination Gross Motor Movements are Fluid and Coordinated: No Fine Motor Movements are Fluid and Coordinated: Yes Coordination and Movement Description: Pt with BUE shoulder weakness, with left shoulder being more limited than right.  She needed one handed technique for washing under the right arm during selfcare task. Motor  Motor Motor: Abnormal postural alignment and control Motor - Discharge Observations: increased kyphosis, rounded shoulders Mobility  Transfers Transfers: Sit to Stand;Stand to Sit Sit to Stand: 3: Mod assist;With armrests;From chair/3-in-1 Stand to Sit: 3: Mod assist;With armrests;To chair/3-in-1  Trunk/Postural Assessment  Cervical Assessment Cervical Assessment: Exceptions to WFL(forward head posture) Thoracic Assessment Thoracic Assessment: Exceptions to WFL(rounded thoracic flexion) Lumbar Assessment Lumbar Assessment: Exceptions to WFL(increased lumbar flexion, maintains posterior pelvic tilt )  Balance Balance Balance Assessed: Yes Static Sitting Balance Static Sitting - Balance Support: Feet supported Static Sitting - Level of Assistance: 5: Stand by assistance Dynamic Sitting Balance Dynamic Sitting - Balance Support: Feet supported;During functional  activity Dynamic Sitting - Level of Assistance: 4: Min assist Static Standing Balance Static Standing - Balance Support: During functional activity Static Standing - Level of Assistance: 3: Mod assist Dynamic Standing Balance Dynamic Standing - Balance Support: During functional activity Dynamic Standing - Level of Assistance: 5: Stand by assistance;3: Mod assist Extremity/Trunk Assessment RUE Assessment RUE Assessment: Exceptions to WFL((AROM shoulder flexion 0-80 degrees.  All other joints AROM WFLS with strength 4/5 throughout) LUE Assessment LUE Assessment: Exceptions to WFL((AROM shoulder flexion 0-70 degrees, WFLS for elbow flexion/extension and digit flexion/extension.  Shoulder strength 3-/5, elbow 3+/5, grip 4/5))   See Function Navigator for Current Functional Status.  Awesome Jared OTR/L 03/12/2018, 4:16 PM

## 2018-03-12 NOTE — Progress Notes (Signed)
Occupational Therapy Session Note  Patient Details  Name: Ayelet Youtsey MAeris Hersman1912 Date of Birth: 15-Mar-1922  Today's Date: 03/12/2018 OT Individual Time: 0902-1002 OT Individual Time Calculation (min): 60 min    Short Term Goals: Week 2:  OT Short Term Goal 1 (Week 2): Continue working on established LTGs set at supervision to min assist level.   Skilled Therapeutic Interventions/Progress Updates:    Pt completed bathing and dressing at the sink per her choice this session.  Min assist for sit to stand when washing peri area and buttocks.  She was able to complete all UB bathing except washing her back with supervision.  Mod assist for UB dressing to donn bra and fasten as well as to [employer] shirt.  She needed only min assist for LB dressing to pull pants and brief over hips.  She could donn all items over her feet including stockings and shoes.  Mod assist for stand pivot transfer to the 3:1 over the toilet, with mod assist for toilet hygiene secondary to incontinent episode.  Finished session with completion of oral hygiene in sitting with setup.  Pt left in the wheelchair with call button and phone in reach.  Safety belt and chair alarm in place.    Therapy Documentation Precautions:  Precautions Precautions: Fall Precaution Comments: L hemiparesis, RLE buckles occasionally Restrictions Weight Bearing Restrictions: No   Pain: Pain Assessment Pain Scale: Faces Pain Score: 0-No pain Faces Pain Scale: Hurts a little bit Pain Type: Acute pain Pain Location: Leg Pain Orientation: Right;Left Pain Descriptors / Indicators: Discomfort ADL: See Function Navigator for Current Functional Status.   Therapy/Group: Individual Therapy  Svara Twyman OTR/L 03/12/2018, 12:27 PM

## 2018-03-12 NOTE — Progress Notes (Signed)
Social Work   Mccrae Speciale, Elveria Rising  Social Worker  Physical Medicine and Rehabilitation  Patient Care Conference  Signed  Date of Service:  03/12/2018  4:17 PM          Signed          Show:Clear all Manual[x] Template[] Copied  Added by: Heatherly Stenner, Lemar Livings, LCSW   Hover for details   Inpatient RehabilitationTeam Conference and Plan of Care Update Date: 03/12/2018   Time: 2:00 PM      Patient Name: Charlene Greene      Medical Record Number: [id number]  Date of Birth: 1921-12-28 Sex: Female         Room/Bed: 4M09C/4M09C-01 Payor Info: Payor: MEDICARE / Plan: MEDICARE PART A AND B / Product Type: *No Product type* /     Admitting Diagnosis: cva  Admit Date/Time:  02/26/2018  6:47 PM Admission Comments: No comment available    Primary Diagnosis:  <principal problem not specified> Principal Problem: <principal problem not specified>       Patient Active Problem List    Diagnosis Date Noted  . Sleep disturbance    . Elevated BUN    . Labile blood pressure    . Essential hypertension    . Dysphasia, post-stroke    . PAD (peripheral artery disease) (HCC)    . Hypoalbuminemia due to protein-calorie malnutrition (HCC)    . Diabetic peripheral neuropathy (HCC)    . Diabetes mellitus type 2 in nonobese (HCC)    . Chronic diastolic congestive heart failure (HCC)    . Left thalamic infarction (HCC) 02/26/2018  . Dysphagia, post-stroke    . Acute on chronic diastolic congestive heart failure (HCC)    . Type 2 diabetes mellitus with peripheral neuropathy (HCC)    . TIA (transient ischemic attack) 02/22/2018  . Labile blood glucose    . Hypotension due to drugs    . Hyponatremia    . Renovascular hypertension    . Hypertensive crisis    . Type 2 diabetes mellitus with diabetic autonomic neuropathy, without long-term current use of insulin (HCC)    . Stage 3 chronic kidney disease (HCC)    . Acute lower UTI    . Acute renal insufficiency 09/27/2016  . Urinary  urgency 09/27/2016  . Basal ganglia infarction (HCC) 09/26/2016  . Neuropathic pain    . Benign essential HTN    . Slow transit constipation    . Elevated creatine kinase    . Accelerated hypertension 09/24/2016  . Diabetes mellitus (HCC) 09/24/2016  . Hyperlipidemia 09/24/2016  . Sick sinus syndrome (HCC) 09/24/2016  . CVA (cerebral vascular accident) (HCC) 09/23/2016  . Stroke (cerebrum) (HCC) 09/23/2016      Expected Discharge Date: Expected Discharge Date: 03/13/18   Team Members Present: Physician leading conference: Dr. Maryla Morrow Social Worker Present: Dossie Der, LCSW Nurse Present: Allayne Stack, RN PT Present: Midge Minium, PT OT Present: Perrin Maltese, OT PPS Coordinator present : Tora Duck, RN, CRRN       Current Status/Progress Goal Weekly Team Focus  Medical     Gait disorder with dysphagia secondary to left thalamic left occipital infarcts as well as intracranial stenosis on 4/13 as well as history of CVA 2017  Improve mobility, self-care, DM  See above   Bowel/Bladder     Continent and incontinent episodes LBM 03/11/2018, Continue  PRN   pt will be continent of B/B with regular bowel pattern  Assess and address toileting needs QS/PRN ,Provide prn  notify MD if no relieve    Swallow/Nutrition/ Hydration     regular textures, thin liquids   mod I   completion of education   ADL's     Pt continues to need min A with bathing, sit to stand, and standing balance with RW; continues to need mod A toileting and toilet transfer and LB dressing, set up with shirt , min A bra  S standing balance and bathing and toileting/ toilet transfer, min A dressing  ADL training, functional mobility, balance   Mobility     S bed mobility, min/modA sit >stand, min guard gait with RW short distances  Downgraded to minA overall d/t slow progress  activity tolerance, LE strengthening, transfer/gait training   Communication               Safety/Cognition/ Behavioral Observations    supervision   supervision   completion of education   Pain     denies need for prn meds for pain, has Tylenol prn if complaint   < 2  QS assessment and prn for generalized pain/disocmfort , Notify MD    Skin     Brusing and scratches to LE's no new evidence of skin integrity issues or concerns , monitor and notify MD    healing scratches no further breakdown of skin   qs assessment and monitorinh for skin integrity changes     *See Care Plan and progress notes for long and short-term goals.      Barriers to Discharge   Current Status/Progress Possible Resolutions Date Resolved   Physician     Medical stability;Decreased caregiver support;Lack of/limited family support;Weight     See above  Therapies, optimize DM, no further Vasc workup      Nursing                 PT                    OT                 SLP            SW              Discharge Planning/Teaching Needs:  Returning to [facility] arraning more assist than had before. Son aware of this      Team Discussion:  Pt flucuates in her levels, dependent upon her motivation with the task at hand. On regular diet now. MD encouraging fluids. Vascular consult done and have signed off. Slow progress will require 24 hr care upon return to [facility]. Downgraded goals to min assist level  Revisions to Treatment Plan:  DC 5/2    Continued Need for Acute Rehabilitation Level of Care: The patient requires daily medical management by a physician with specialized training in physical medicine and rehabilitation for the following conditions: Daily direction of a multidisciplinary physical rehabilitation program to ensure safe treatment while eliciting the highest outcome that is of practical value to the patient.: Yes Daily medical management of patient stability for increased activity during participation in an intensive rehabilitation regime.: Yes Daily analysis of laboratory values and/or radiology reports with any  subsequent need for medication adjustment of medical intervention for : Neurological problems;Diabetes problems;Blood pressure problems;Other   Lucy Chris 03/12/2018, 4:17 PM                  Biruk Troia, Lemar Livings, LCSW  Social Worker  Physical Medicine and Rehabilitation  Patient Care Conference  Signed  Date of Service:  03/05/2018  3:00 PM          Signed          Show:Clear all Manual[x] Template[] Copied  Added by: Franciszek Platten, Lemar Livings, LCSW   Hover for details   Inpatient RehabilitationTeam Conference and Plan of Care Update Date: 03/05/2018   Time: 10:25 AM      Patient Name: Charlene Greene      Medical Record Number: [id number]  Date of Birth: 1922/06/29 Sex: Female         Room/Bed: 4M09C/4M09C-01 Payor Info: Payor: MEDICARE / Plan: MEDICARE PART A AND B / Product Type: *No Product type* /     Admitting Diagnosis: cva  Admit Date/Time:  02/26/2018  6:47 PM Admission Comments: No comment available    Primary Diagnosis:  <principal problem not specified> Principal Problem: <principal problem not specified>       Patient Active Problem List    Diagnosis Date Noted  . Essential hypertension    . Dysphasia, post-stroke    . PAD (peripheral artery disease) (HCC)    . Hypoalbuminemia due to protein-calorie malnutrition (HCC)    . Diabetic peripheral neuropathy (HCC)    . Diabetes mellitus type 2 in nonobese (HCC)    . Chronic diastolic congestive heart failure (HCC)    . Left thalamic infarction (HCC) 02/26/2018  . Dysphagia, post-stroke    . Acute on chronic diastolic congestive heart failure (HCC)    . Type 2 diabetes mellitus with peripheral neuropathy (HCC)    . TIA (transient ischemic attack) 02/22/2018  . Labile blood glucose    . Hypotension due to drugs    . Hyponatremia    . Renovascular hypertension    . Hypertensive crisis    . Type 2 diabetes mellitus with diabetic autonomic neuropathy, without long-term current use of insulin  (HCC)    . Stage 3 chronic kidney disease (HCC)    . Acute lower UTI    . Acute renal insufficiency 09/27/2016  . Urinary urgency 09/27/2016  . Basal ganglia infarction (HCC) 09/26/2016  . Neuropathic pain    . Benign essential HTN    . Slow transit constipation    . Elevated creatine kinase    . Accelerated hypertension 09/24/2016  . Diabetes mellitus (HCC) 09/24/2016  . Hyperlipidemia 09/24/2016  . Sick sinus syndrome (HCC) 09/24/2016  . CVA (cerebral vascular accident) (HCC) 09/23/2016  . Stroke (cerebrum) (HCC) 09/23/2016      Expected Discharge Date: Expected Discharge Date: 03/13/18   Team Members Present: Physician leading conference: Dr. Maryla Morrow Social Worker Present: Dossie Der, LCSW Nurse Present: Chana Bode, RN PT Present: Alyson Reedy, PT OT Present: Perrin Maltese, OT SLP Present: Reuel Derby, SLP PPS Coordinator present : Tora Duck, RN, CRRN       Current Status/Progress Goal Weekly Team Focus  Medical     Gait disorder with dysphagia secondary to left thalamic left occipital infarcts as well as intracranial stenosis on 4/13 as well as history of CVA 2017  Improve mobility, self-care, DM/BP, LE pain  See above   Bowel/Bladder     Continent with incontinent episodes LBM 04/23  pt will be continent of B/B with regular bowel pattern  toilet pt every two hours    Swallow/Nutrition/ Hydration     recommend upgrading to thin liquids vai cup or straw  Mod I   dysphagia 3 with thin liquids, intermittent supervision   ADL's  min A bathing, UB dressing;  mod A toileting/ toilet transfer; mod A LB dressing; min A standing balance  S standing balance and bathing and toileting/ toilet transfer, min A dressing  ADL training, functional mobility, balance   Mobility     S/minA bed mobility, min>modA sit <>stand and transfers, gait min guard x20'   S overall  LE strengthening, activity tolerance, dynamic balance   Communication                 Safety/Cognition/ Behavioral Observations   min assist-supervision   supervision   selective attention, recall of daily/new information, problem solving during ADLs   Pain     Pt c/o generalized pain relieved with tylenol prn  pain <3  assess pain q shift and prn notify MD for unrelieved pain   Skin     scratches on ankles   healing scratches no further breakdown of skin   assess skin q shift and prn     *See Care Plan and progress notes for long and short-term goals.      Barriers to Discharge   Current Status/Progress Possible Resolutions Date Resolved   Physician     Medical stability;Decreased caregiver support;Lack of/limited family support;Weight     See above  Therapies, optimize BP/HTN meds, Vascular consult      Nursing                 PT                    OT                 SLP            SW              Discharge Planning/Teaching Needs:  To return to [facility], may need to have more assist when goes back there.      Team Discussion:  Progressing toward her goals of supervision-min assist level. Passed MBS now on thin liquids. MD adjusting diabetes and BP meds. Consulted vascular for leg pain-dopplers were negative. Levels flucuate due to endurance and fatigue level. Has assist at ALF for bathing and dressing.  Revisions to Treatment Plan:  DC 5/2    Continued Need for Acute Rehabilitation Level of Care: The patient requires daily medical management by a physician with specialized training in physical medicine and rehabilitation for the following conditions: Daily direction of a multidisciplinary physical rehabilitation program to ensure safe treatment while eliciting the highest outcome that is of practical value to the patient.: Yes Daily medical management of patient stability for increased activity during participation in an intensive rehabilitation regime.: Yes Daily analysis of laboratory values and/or radiology reports with any subsequent need for  medication adjustment of medical intervention for : Neurological problems;Diabetes problems;Blood pressure problems   Don Tiu, Lemar Livings 03/05/2018, 3:00 PM                 Patient ID: Kashauna Celmer, female   DOB: 1922-05-20, 84 y.o.   MRN: [id number]

## 2018-03-12 NOTE — Progress Notes (Addendum)
Physical Therapy Discharge Summary  Patient Details  Name: Charlene Greene MRN: [id number] Date of Birth: 1922-07-11   Today's Date: 03/12/2018 PT Individual Time: 1100-1130 PT Individual Time Calculation (min): 30 min   Patient has met 7 of 7 long term goals due to improved activity tolerance, improved balance and increased strength.  Patient to discharge at an ambulatory level for short distance gait Min Assist; will require w/c transport for longer distances, pt is able to propel w/c with S using BLEs.  Patient to d/c to assisted living facility for continued assistance with ADLs and mobility.   Reasons goals not met: All goals met; gait distance on this date limited to 15' due to fatigue/self-limiting however pt demonstrates ability to ambulate 25'  Recommendation:  Patient will benefit from ongoing skilled PT services in home health setting in AFL to continue to advance safe functional mobility, address ongoing impairments in strength, endurance, ROM, balance, and minimize fall risk.  Equipment: No equipment provided  Reasons for discharge: treatment goals met and discharge from hospital  Patient/family agrees with progress made and goals achieved: Yes  PT Discharge Precautions/Restrictions Precautions Precautions: Fall Precaution Comments: L hemiparesis, RLE buckles occasionally Restrictions Weight Bearing Restrictions: No Vital Signs Therapy Vitals Pulse Rate: 67 BP: 134/71 Pain Pain Assessment Pain Scale: 0-10 Pain Score: 0-No pain Vision/Perception  Perception Perception: Within Functional Limits Praxis Praxis: Intact  Cognition Overall Cognitive Status: Within Functional Limits for tasks assessed Arousal/Alertness: Awake/alert Orientation Level: Oriented X4 Safety/Judgment: Appears intact Sensation Sensation Light Touch: Appears Intact Proprioception: Appears Intact Coordination Gross Motor Movements are Fluid and Coordinated: No Heel Shin Test: slow,  decreased ROM Motor  Motor Motor: Abnormal postural alignment and control Motor - Discharge Observations: increased kyphosis, rounded shoulders  Mobility Bed Mobility Bed Mobility: Supine to Sit;Sit to Supine Supine to Sit: 5: Supervision;HOB flat Supine to Sit Details: Verbal cues for technique Sit to Supine: 5: Supervision;HOB flat Sit to Supine - Details: Verbal cues for technique Transfers Transfers: Yes Sit to Stand: 4: Min assist Sit to Stand Details: Verbal cues for technique;Tactile cues for weight shifting Stand Pivot Transfers: 4: Min assist;With armrests(with RW) Stand Pivot Transfer Details: Verbal cues for technique;Verbal cues for precautions/safety;Tactile cues for placement Locomotion  Ambulation Ambulation: Yes Ambulation/Gait Assistance: 4: Min guard Ambulation Distance (Feet): 15 Feet Assistive device: Rolling walker Ambulation/Gait Assistance Details: Verbal cues for technique;Verbal cues for precautions/safety;Tactile cues for posture;Tactile cues for weight shifting Ambulation/Gait Assistance Details: self-limiting re: distance Gait Gait: Yes Gait Pattern: Impaired Gait Pattern: Trunk flexed;Poor foot clearance - right;Poor foot clearance - left;Right flexed knee in stance;Left flexed knee in stance;Lateral hip instability;Step-through pattern;Decreased stride length Gait velocity: significantly decreased Stairs / Additional Locomotion Stairs: No Architect: Yes Wheelchair Assistance: 5: Investment banker, operational Details: Verbal cues for Marketing executive: Both lower extermities Wheelchair Parts Management: Needs assistance Distance: 100'(for strengthening/endurance)  Trunk/Postural Assessment  Cervical Assessment Cervical Assessment: Exceptions to WFL(forward head) Thoracic Assessment Thoracic Assessment: Exceptions to WFL(increased kyphosis, rounded shoulders) Lumbar Assessment Lumbar Assessment:  Exceptions to WFL(reduced lumbar lordosis, posterior pelvic tilt) Postural Control Postural Control: Deficits on evaluation(delayed/absent stepping strategies, poor power production for righting reactions)  Balance Balance Balance Assessed: Yes Static Sitting Balance Static Sitting - Balance Support: Feet supported Static Sitting - Level of Assistance: 6: Modified independent (Device/Increase time) Dynamic Sitting Balance Dynamic Sitting - Balance Support: Feet supported;During functional activity Dynamic Sitting - Level of Assistance: 6: Modified independent (Device/Increase time) Static Standing Balance Static Standing -  Balance Support: During functional activity Static Standing - Level of Assistance: 5: Stand by assistance Dynamic Standing Balance Dynamic Standing - Balance Support: During functional activity;Bilateral upper extremity supported Dynamic Standing - Level of Assistance: 4: Min assist;5: Stand by assistance Extremity Assessment   BUEs: defer to OT examination   RLE Assessment RLE Assessment: Exceptions to WFL(4/5 to 4+/5 throughout) LLE Assessment LLE Assessment: Exceptions to WFL(hip flexion 4-/5, remainder 4/5 throughout)  Skilled Therapeutic Intervention: Pt received seated in w/c, denies pain and agreeable to treatment. Assessed mobility as described above with minA overall; requires cues for positioning (I.e. Scooting to edge of chair, hand positioning, etc) to initiate sit>stand with less assist, but able to perform minA especially when motivated by task (returning to bed, recliner, etc). Discussed plan to d/c back to ALF where pt reports she will have more assistance than before which she has arranged with her family. Pt with no further questions or concerns regarding d/c at this time; remained seated in recliner at end of session, quick release belt and chair alarm intact, all needs in reach.    See Function Navigator for Current Functional Status.  Benjiman Core  Tygielski 03/12/2018, 11:50 AM

## 2018-03-12 NOTE — Discharge Summary (Signed)
Discharge summary job [phone]

## 2018-03-12 NOTE — Progress Notes (Signed)
Speech Language Pathology Discharge Summary  Patient Details  Name: Charlene Greene MRN: [id number] Date of Birth: 09-18-1922  Today's Date: 03/12/2018 SLP Individual Time: 1300-1355 SLP Individual Time Calculation (min): 55 min   Skilled Therapeutic Interventions:  Pt was seen for skilled ST targeting pt education.  SLP discussed activities for cognitive stimulation at assisted living facility.  Pt reports she enjoys word search puzzles and would attend [facility] almost daily and weekly worship services at ALF prior to admission.  SLP also discussed simple memory compensatory strategies to maximize carryover of daily information and overall functional independence.  SLP also provided skilled dysphagia education regarding s/s of aspiration and developing infectious process (fever, increased work of breathing, increased chest congestion) and to seek medical care as soon as possible should she experience these symptoms.       Patient has met 5 of 6 long term goals.  Patient to discharge at overall Modified Independent;Supervision level.  Reasons goals not met: min assist needed for use of memory aids   Clinical Impression/Discharge Summary:   Pt has made functional gains while inpatient and is discharging having met 5 out of 6 short term goals.  Pt's diet has been advanced to regular textures and thin liquids which she is consuming with mod I use of swallowing precautions.  Pt has demonstrated improved selective attention to tasks and is back to baseline for cognition.  Pt education is complete at this time and pt is discharging back to ALF with increased level of assistance.    Care Partner:  Caregiver Able to Provide Assistance: (ALF)  Type of Caregiver Assistance: (ALF)  Recommendation:  None      Equipment: none recommended by SLP    Reasons for discharge: Discharged from hospital   Patient/Family Agrees with Progress Made and Goals Achieved: Yes   Function:  Eating Eating   Modified  Consistency Diet: Yes Eating Assist Level: More than reasonable amount of time   Eating Set Up Assist For: Opening containers       Cognition Comprehension Comprehension assist level: Follows basic conversation/direction with extra time/assistive device  Expression   Expression assist level: Expresses basic needs/ideas: With extra time/assistive device  Social Interaction Social Interaction assist level: Interacts appropriately 90% of the time - Needs monitoring or encouragement for participation or interaction.  Problem Solving Problem solving assist level: Solves basic 90% of the time/requires cueing < 10% of the time  Memory Memory assist level: Recognizes or recalls 75 - 89% of the time/requires cueing 10 - 24% of the time   Windell Moulding L 03/12/2018, 2:30 PM

## 2018-03-12 NOTE — Progress Notes (Signed)
Social Work Patient ID: Charlene Greene, female   DOB: 1922/08/17, 84 y.o.   MRN: [id number]  Met with pt, son, daughter and Jennifer-RN from [place] regarding returning tomorrow and making sure pt will have 24 hr care there. [facility] reports they can provide the car ept requires and will continue to provide follow up therapies through [facility]. Have made referral to [facility] and son to come tomorrow around 11:00 to transport pt back to facility. Will make sure they have the paperwork  they need. See in am.

## 2018-03-12 NOTE — Progress Notes (Signed)
  Progress Note    03/12/2018 7:59 AM  Subjective:  No pain in feet overnight or this morning during exam.  Patient wants to return to SNF today   Vitals:   03/11/18 2200 03/12/18 0526  BP: (!) 169/72 (!) 158/70  Pulse:  61  Resp:  18  Temp:  98 F (36.7 C)  SpO2: 98% 94%   Physical Exam: Lungs:  Non labored on RA Extremities:  Feet symmetrically warm to touch; no active tissue ischemia B feet Abdomen:  Soft Neurologic: A&O  CBC    Component Value Date/Time   WBC 5.7 03/07/2018 0727   RBC 4.54 03/07/2018 0727   HGB 13.1 03/07/2018 0727   HCT 40.4 03/07/2018 0727   PLT 210 03/07/2018 0727   MCV 89.0 03/07/2018 0727   MCH 28.9 03/07/2018 0727   MCHC 32.4 03/07/2018 0727   RDW 13.7 03/07/2018 0727   LYMPHSABS 1.5 03/07/2018 0727   MONOABS 0.3 03/07/2018 0727   EOSABS 0.2 03/07/2018 0727   BASOSABS 0.0 03/07/2018 0727    BMET    Component Value Date/Time   NA 140 03/07/2018 0727   K 3.8 03/07/2018 0727   CL 104 03/07/2018 0727   CO2 26 03/07/2018 0727   GLUCOSE 123 (H) 03/07/2018 0727   BUN 23 (H) 03/07/2018 0727   CREATININE 0.67 03/07/2018 0727   CALCIUM 8.9 03/07/2018 0727   GFRNONAA >60 03/07/2018 0727   GFRAA >60 03/07/2018 0727    INR    Component Value Date/Time   INR 1.00 02/22/2018 1318     Intake/Output Summary (Last 24 hours) at 03/12/2018 0759 Last data filed at 03/11/2018 1700 Gross per 24 hour  Intake 570 ml  Output -  Net 570 ml     RIGHT    LEFT    PRESSURE WAVEFORM  PRESSURE WAVEFORM  BRACHIAL 138 Biphasic BRACHIAL 141 Triphasic  DP 67 monophasic DP 0 Not audible  AT  Not audible AT  Not audible  PT 0 Not audible PT 0 Not audible  PER   PER    GREAT TOE  No PPG waveforms detedted GREAT TOE 0 No PPG waveforms detedted    RIGHT LEFT  ABI 0.48 0    Assessment/Plan:  84 y.o. female seen in consultation for painful R foot  Pain completely resolved Decreased ABIs however no rest pain or active tissue  ischemia No indication for further workup or revascularization at this time [facility] for discharge from vascular surgery standpoint   Emilie Rutter, PA-C [facility] [phone] 03/12/2018 7:59 AM

## 2018-03-13 LAB — GLUCOSE, CAPILLARY: Glucose-Capillary: 82 mg/dL (ref 65–99)

## 2018-03-13 MED ORDER — CLOPIDOGREL BISULFATE 75 MG PO TABS: 75.0000 mg | ORAL_TABLET | Freq: Every day | ORAL | 0 refills | Status: AC

## 2018-03-13 MED ORDER — GLIMEPIRIDE 1 MG PO TABS: 1.0000 mg | ORAL_TABLET | Freq: Every day | ORAL | 0 refills | Status: AC

## 2018-03-13 MED ORDER — EZETIMIBE 10 MG PO TABS: 10.0000 mg | ORAL_TABLET | Freq: Every day | ORAL | 1 refills | Status: DC

## 2018-03-13 MED ORDER — AMLODIPINE BESYLATE 5 MG PO TABS: 5.0000 mg | ORAL_TABLET | Freq: Two times a day (BID) | ORAL | 0 refills | Status: DC

## 2018-03-13 MED ORDER — LISINOPRIL 20 MG PO TABS: 20.0000 mg | ORAL_TABLET | Freq: Two times a day (BID) | ORAL | 0 refills | Status: AC

## 2018-03-13 MED ORDER — METOPROLOL SUCCINATE ER 100 MG PO TB24
100.0000 mg | ORAL_TABLET | Freq: Every day | ORAL | 1 refills | Status: AC
Start: 2018-03-13 — End: ?

## 2018-03-13 MED ORDER — BUSPIRONE HCL 5 MG PO TABS: 5.0000 mg | ORAL_TABLET | Freq: Two times a day (BID) | ORAL | 0 refills | Status: DC

## 2018-03-13 MED ORDER — TRAZODONE HCL 50 MG PO TABS: 25.0000 mg | ORAL_TABLET | Freq: Every day | ORAL | 0 refills | Status: DC

## 2018-03-13 MED ORDER — GUAIFENESIN ER 600 MG PO TB12
600.0000 mg | ORAL_TABLET | Freq: Two times a day (BID) | ORAL | 0 refills | Status: DC
Start: 2018-03-13 — End: 2018-05-07

## 2018-03-13 MED ORDER — PANTOPRAZOLE SODIUM 20 MG PO TBEC: 20.0000 mg | DELAYED_RELEASE_TABLET | Freq: Every day | ORAL | 0 refills | Status: DC

## 2018-03-13 NOTE — Progress Notes (Signed)
Social Work  Discharge Note  The overall goal for the admission was met for:   Discharge location: Yes-[place]  Length of Stay: Yes-15 DAYS  Discharge activity level: Yes-MIN-MOD LEVEL  Home/community participation: Yes  Services provided included: MD, RD, PT, OT, SLP, RN, CM, Pharmacy and SW  Financial Services: Medicare and Private Insurance: [facility]  Follow-up services arranged: Home Health: [place], DME: SON TO GET 3IN1 and Patient/Family request agency HH: PREF DUE TO ALF, DME: NO PREF  Comments (or additional information):JENNIFER LOVE-RN TO EVALUATE PT AND MAKE SURE THEY CAN PROVIDE THE CARE SHE REQUIRES. FACILITY WILL INCREASE HER CARE LEVEL AND PROVIDE WHAT SHE NEEDS. PT READY TO GO BACK THERE.  Patient/Family verbalized understanding of follow-up arrangements: Yes  Individual responsible for coordination of the follow-up plan: [name]-SON  Confirmed correct DME delivered: Elease Hashimoto 03/13/2018    Elease Hashimoto

## 2018-03-13 NOTE — Progress Notes (Signed)
[place] PHYSICAL MEDICINE & REHABILITATION     PROGRESS NOTE  Subjective/Complaints:  Pt seen sitting up in bed this morning. She states she slept well overnight. She states she is ready for discharge today.  ROS: Denies CP, SOB, N/V/D.  Objective: Vital Signs: Blood pressure (!) 141/67, pulse 62, temperature 98.1 F (36.7 C), temperature source Oral, resp. rate 18, height  (1.651 m), weight 74.1 kg (163 lb 5.8 oz), SpO2 91 %. No results found. No results for input(s): WBC, HGB, HCT, PLT in the last 72 hours. Recent Labs    03/12/18 0705  NA 144  K 4.0  CL 107  GLUCOSE 115*  BUN 19  CREATININE 0.66  CALCIUM 8.8*   CBG (last 3)  Recent Labs    03/12/18 1632 03/12/18 2135 03/13/18 0637  GLUCAP 97 199* 82    Wt Readings from Last 3 Encounters:  03/13/18 74.1 kg (163 lb 5.8 oz)  02/22/18 75.9 kg (167 lb 5.3 oz)  09/26/16 82.4 kg (181 lb 9.6 oz)    Physical Exam:  BP (!) 141/67   Pulse 62   Temp 98.1 F (36.7 C) (Oral)   Resp 18   Ht  (1.651 m)   Wt 74.1 kg (163 lb 5.8 oz)   SpO2 91%   BMI 27.18 kg/m  Constitutional: No distress . Vital signs reviewed. HENT: Normocephalic, atraumatic.  Eyes: EOMI. No discharge.  Cardiovascular: RRR. No JVD    Respiratory: CTA Bilaterally. Normal effort    GI: BS +, non-distended  Musculoskeletal: B/l LE TTP, ?improved when attention shifted Neurological: She is alert.  Made good eye contact with examiner.   Fair awareness of deficits.   Follow commands.   Motor: RUE/RLE: 4/5 proximal to distal LUE/LLE: 4/5 proximal to distal (stable) Skin: Skin is warm and dry Psychiatric: She has a normal mood and affect. Her behavior is normal.   Assessment/Plan: 1. Functional deficits secondary to left thalamic left occipital infarcts as well as intracranial stenosis as well as history of CVA 2017 which require 3+ hours per day of interdisciplinary therapy in a comprehensive inpatient rehab setting. Physiatrist is  providing close team supervision and 24 hour management of active medical problems listed below. Physiatrist and rehab team continue to assess barriers to discharge/monitor patient progress toward functional and medical goals.  Function:  Bathing Bathing position   Position: Shower  Bathing parts Body parts bathed by patient: Right arm, Left arm, Chest, Abdomen, Front perineal area, Right upper leg, Left upper leg, Left lower leg, Right lower leg, Buttocks Body parts bathed by helper: Back  Bathing assist Assist Level: Touching or steadying assistance(Pt > 75%)      Upper Body Dressing/Undressing Upper body dressing   What is the patient wearing?: Bra, Pull over shirt/dress Bra - Perfomed by patient: Thread/unthread left bra strap, Thread/unthread right bra strap Bra - Perfomed by helper: Hook/unhook bra (pull down sports bra) Pull over shirt/dress - Perfomed by patient: Thread/unthread right sleeve, Thread/unthread left sleeve Pull over shirt/dress - Perfomed by helper: Pull shirt over trunk, Put head through opening        Upper body assist Assist Level: Touching or steadying assistance(Pt > 75%)      Lower Body Dressing/Undressing Lower body dressing   What is the patient wearing?: Pants, Socks, Shoes Underwear - Performed by patient: Thread/unthread right underwear leg, Thread/unthread left underwear leg Underwear - Performed by helper: Pull underwear up/down Pants- Performed by patient: Thread/unthread right pants leg, Thread/unthread  left pants leg Pants- Performed by helper: Pull pants up/down   Non-skid slipper socks- Performed by helper: Don/doff right sock, Don/doff left sock Socks - Performed by patient: Don/doff right sock Socks - Performed by helper: Don/doff left sock, Don/doff right sock Shoes - Performed by patient: Don/doff right shoe, Don/doff left shoe Shoes - Performed by helper: Don/doff right shoe, Don/doff left shoe, Fasten right, Fasten left   AFO -  Performed by helper: Don/doff right AFO      Lower body assist Assist for lower body dressing: Touching or steadying assistance (Pt > 75%)      Toileting Toileting   Toileting steps completed by patient: Performs perineal hygiene, Adjust clothing prior to toileting Toileting steps completed by helper: Adjust clothing after toileting Toileting Assistive Devices: Grab bar or rail  Toileting assist Assist level: Touching or steadying assistance (Pt.75%)   Transfers Chair/bed transfer   Chair/bed transfer method: Stand pivot Chair/bed transfer assist level: Touching or steadying assistance (Pt > 75%) Chair/bed transfer assistive device: Armrests, Patent attorney     Max distance: 15 Assist level: Touching or steadying assistance (Pt > 75%)   Wheelchair Wheelchair activity did not occur: Safety/medical concerns Type: Manual Max wheelchair distance: 100 Assist Level: Supervision or verbal cues  Cognition Comprehension Comprehension assist level: Follows basic conversation/direction with extra time/assistive device  Expression Expression assist level: Expresses basic needs/ideas: With extra time/assistive device  Social Interaction Social Interaction assist level: Interacts appropriately 90% of the time - Needs monitoring or encouragement for participation or interaction.  Problem Solving Problem solving assist level: Solves basic 90% of the time/requires cueing < 10% of the time  Memory Memory assist level: Recognizes or recalls 75 - 89% of the time/requires cueing 10 - 24% of the time    Medical Problem List and Plan: 1.  Gait disorder with dysphagia secondary to left thalamic left occipital infarcts as well as intracranial stenosis on 4/13 as well as history of CVA 2017  D/c today  Will see patient for transitional care management in 1-2 weeks 2.  DVT Prophylaxis/Anticoagulation: SCDs.     Vascular study neg for DVT, however, suggestive of Baker's cyst, with  edema and femoral artery calcification 3. Pain Management: Tylenol as needed  Ice prn  Right foot film reviewed, unremarkable 4. Mood: Provide emotional support 5. Neuropsych: This patient is ?fully capable of making decisions on her own behalf. 6. Skin/Wound Care: Routine skin checks 7. Fluids/Electrolytes/Nutrition: Routine I/O's    BMP within acceptable range on 5/1with BUN that continues to trend up  Encourage fluids  Recommend outpatient follow-up 8.  Dysphagia. Regular diet with intermittent supervision now 9.  Diastolic congestive heart failure.  Monitor for any signs of fluid overload Filed Weights   03/11/18 0439 03/12/18 0525 03/13/18 0159  Weight: 74.4 kg (164 lb) 75.1 kg (165 lb 9.1 oz) 74.1 kg (163 lb 5.8 oz)   Stable on 5/2 10.  Sick sinus syndrome status post pacemaker.  Follow-up per cardiology services 11.  Diabetes mellitus peripheral neuropathy.  Hemoglobin A1c 6.9.  SSI.  Check blood sugars before meals and at bedtime.    Patient on Amaryl 2 mg daily prior to admission.  Resume as needed   Amaryl resumed on 4/20 CBG (last 3)  Recent Labs    03/12/18 1632 03/12/18 2135 03/13/18 0637  GLUCAP 97 199* 82   Labile on 5/2 12.  Hypertension.  Toprol-XL 100 mg daily, Norvasc 5 mg twice daily.  Vitals:   03/13/18 0159 03/13/18 0834  BP: (!) 150/53 (!) 141/67  Pulse: 61 62  Resp: 18   Temp: 98.1 F (36.7 C)   SpO2: 91%    Lisinopril 5 started on 4/23, increased to twice a day on 4/25, increased to 10 mg twice a day on 4/26, increased to 15 BID on 4/29, increased to 20 twice a day on 5/1  Remained labile on 5/2  Will need ambulatory monitoring 13.  Hyperlipidemia.  Zetia 14. Hypoalbuminemia  Supplement initiated on 4/15  15. PAD  Has fairly consistent pain in BLE with partial relief using tylenol, fem art calcification likely chronic  ABIs performed, left greater than disease, however not symptomatic at present. No further workup indicated at this time for  vascular. 16. Sleep disturbance  Low dose trazodone started on 4/29  Improved  LOS (Days) 15 A FACE TO FACE EVALUATION WAS PERFORMED  Aelyn Stanaland Karis Juba 03/13/2018 9:12 AM

## 2018-03-17 ENCOUNTER — Telehealth: Payer: Self-pay | Admitting: *Deleted

## 2018-03-17 NOTE — Telephone Encounter (Signed)
Transitional Care Call 1st attempt,  Left message with son, [name] to contact us to confirm appointment with Dr. Allena Katz.

## 2018-03-18 NOTE — Telephone Encounter (Signed)
Transitional Care Call Completed, Appointment changed to May 16th, 2019. Address confirmed, New patient packet mailed   Transitional Care call-who you talked with    1. Are you/is patient experiencing any problems since coming home? Diminished mobility Are there any questions regarding any aspect of care? No  2. Are there any questions regarding medications administration/dosing? No  Are meds being taken as prescribed? Yes  Patient should review meds with caller to confirm  3. Have there been any falls? No  4. Has Home Health been to the house and/or have they contacted you?  Yes If not, have you tried to contact them? Can we help you contact them? No  5. Are bowels and bladder emptying properly?  Urgency and incontinence  Are there any unexpected incontinence issues?  Not unexpected If applicable, is patient following bowel/bladder programs?  6. Any fevers, problems with breathing, unexpected pain?  No, No, No  7. Are there any skin problems or new areas of breakdown? No  8. Has the patient/family member arranged specialty MD follow up (ie cardiology/neurology/renal/surgical/etc)? Yes  Can we help arrange? No 9. Does the patient need any other services or support that we can help arrange? DME 3 in 1 commode  10. Are caregivers following through as expected in assisting the patient? Yes  11. Has the patient quit smoking, drinking alcohol, or using drugs as recommended? No drink, no smoke, no illicit drug use

## 2018-03-21 ENCOUNTER — Encounter: Payer: Medicare Other | Admitting: Physical Medicine & Rehabilitation

## 2018-03-27 ENCOUNTER — Encounter: Payer: Medicare Other | Admitting: Physical Medicine & Rehabilitation

## 2018-05-07 ENCOUNTER — Emergency Department (HOSPITAL_COMMUNITY): Payer: Medicare Other

## 2018-05-07 ENCOUNTER — Encounter (HOSPITAL_COMMUNITY): Payer: Self-pay

## 2018-05-07 ENCOUNTER — Inpatient Hospital Stay (HOSPITAL_COMMUNITY): Payer: Medicare Other

## 2018-05-07 ENCOUNTER — Other Ambulatory Visit: Payer: Self-pay

## 2018-05-07 ENCOUNTER — Inpatient Hospital Stay (HOSPITAL_COMMUNITY)
Admission: EM | Admit: 2018-05-07 | Discharge: 2018-05-14 | DRG: 193 | Disposition: A | Discharge status: Home Health | Payer: Medicare Other | Attending: Internal Medicine | Admitting: Internal Medicine

## 2018-05-07 DIAGNOSIS — I69391 Dysphagia following cerebral infarction: Secondary | ICD-10-CM | POA: Diagnosis not present

## 2018-05-07 DIAGNOSIS — I130 Hypertensive heart and chronic kidney disease with heart failure and stage 1 through stage 4 chronic kidney disease, or unspecified chronic kidney disease: Secondary | ICD-10-CM | POA: Diagnosis present

## 2018-05-07 DIAGNOSIS — Z950 Presence of cardiac pacemaker: Secondary | ICD-10-CM | POA: Diagnosis not present

## 2018-05-07 DIAGNOSIS — J9621 Acute and chronic respiratory failure with hypoxia: Secondary | ICD-10-CM | POA: Diagnosis present

## 2018-05-07 DIAGNOSIS — Z801 Family history of malignant neoplasm of trachea, bronchus and lung: Secondary | ICD-10-CM

## 2018-05-07 DIAGNOSIS — I5033 Acute on chronic diastolic (congestive) heart failure: Secondary | ICD-10-CM | POA: Diagnosis present

## 2018-05-07 DIAGNOSIS — I495 Sick sinus syndrome: Secondary | ICD-10-CM | POA: Diagnosis present

## 2018-05-07 DIAGNOSIS — Z886 Allergy status to analgesic agent status: Secondary | ICD-10-CM

## 2018-05-07 DIAGNOSIS — J9601 Acute respiratory failure with hypoxia: Secondary | ICD-10-CM

## 2018-05-07 DIAGNOSIS — L89311 Pressure ulcer of right buttock, stage 1: Secondary | ICD-10-CM | POA: Diagnosis present

## 2018-05-07 DIAGNOSIS — Z885 Allergy status to narcotic agent status: Secondary | ICD-10-CM | POA: Diagnosis not present

## 2018-05-07 DIAGNOSIS — R0602 Shortness of breath: Secondary | ICD-10-CM

## 2018-05-07 DIAGNOSIS — E119 Type 2 diabetes mellitus without complications: Secondary | ICD-10-CM | POA: Diagnosis not present

## 2018-05-07 DIAGNOSIS — Z7982 Long term (current) use of aspirin: Secondary | ICD-10-CM

## 2018-05-07 DIAGNOSIS — Z955 Presence of coronary angioplasty implant and graft: Secondary | ICD-10-CM | POA: Diagnosis not present

## 2018-05-07 DIAGNOSIS — Z7902 Long term (current) use of antithrombotics/antiplatelets: Secondary | ICD-10-CM | POA: Diagnosis not present

## 2018-05-07 DIAGNOSIS — J189 Pneumonia, unspecified organism: Secondary | ICD-10-CM | POA: Diagnosis not present

## 2018-05-07 DIAGNOSIS — I2510 Atherosclerotic heart disease of native coronary artery without angina pectoris: Secondary | ICD-10-CM | POA: Diagnosis present

## 2018-05-07 DIAGNOSIS — I739 Peripheral vascular disease, unspecified: Secondary | ICD-10-CM | POA: Diagnosis present

## 2018-05-07 DIAGNOSIS — Y95 Nosocomial condition: Secondary | ICD-10-CM | POA: Diagnosis present

## 2018-05-07 DIAGNOSIS — Z66 Do not resuscitate: Secondary | ICD-10-CM | POA: Diagnosis present

## 2018-05-07 DIAGNOSIS — Z79899 Other long term (current) drug therapy: Secondary | ICD-10-CM | POA: Diagnosis not present

## 2018-05-07 DIAGNOSIS — E785 Hyperlipidemia, unspecified: Secondary | ICD-10-CM | POA: Diagnosis present

## 2018-05-07 DIAGNOSIS — L89321 Pressure ulcer of left buttock, stage 1: Secondary | ICD-10-CM | POA: Diagnosis present

## 2018-05-07 DIAGNOSIS — N183 Chronic kidney disease, stage 3 unspecified: Secondary | ICD-10-CM | POA: Diagnosis present

## 2018-05-07 DIAGNOSIS — J181 Lobar pneumonia, unspecified organism: Secondary | ICD-10-CM | POA: Diagnosis not present

## 2018-05-07 DIAGNOSIS — Z8249 Family history of ischemic heart disease and other diseases of the circulatory system: Secondary | ICD-10-CM

## 2018-05-07 DIAGNOSIS — I639 Cerebral infarction, unspecified: Secondary | ICD-10-CM | POA: Diagnosis present

## 2018-05-07 DIAGNOSIS — Z888 Allergy status to other drugs, medicaments and biological substances status: Secondary | ICD-10-CM | POA: Diagnosis not present

## 2018-05-07 DIAGNOSIS — L8990 Pressure ulcer of unspecified site, unspecified stage: Secondary | ICD-10-CM

## 2018-05-07 DIAGNOSIS — I5032 Chronic diastolic (congestive) heart failure: Secondary | ICD-10-CM | POA: Diagnosis present

## 2018-05-07 DIAGNOSIS — E1151 Type 2 diabetes mellitus with diabetic peripheral angiopathy without gangrene: Secondary | ICD-10-CM | POA: Diagnosis present

## 2018-05-07 DIAGNOSIS — R1312 Dysphagia, oropharyngeal phase: Secondary | ICD-10-CM | POA: Diagnosis present

## 2018-05-07 DIAGNOSIS — I6381 Other cerebral infarction due to occlusion or stenosis of small artery: Secondary | ICD-10-CM | POA: Diagnosis present

## 2018-05-07 DIAGNOSIS — E1142 Type 2 diabetes mellitus with diabetic polyneuropathy: Secondary | ICD-10-CM | POA: Diagnosis present

## 2018-05-07 LAB — CBC WITH DIFFERENTIAL/PLATELET
ABS IMMATURE GRANULOCYTES: 0.2 10*3/uL — AB (ref 0.0–0.1)
BASOS ABS: 0 10*3/uL (ref 0.0–0.1)
BASOS PCT: 0 %
Eosinophils Absolute: 0 10*3/uL (ref 0.0–0.7)
Eosinophils Relative: 0 %
HCT: 41.9 % (ref 36.0–46.0)
Hemoglobin: 13.3 g/dL (ref 12.0–15.0)
Immature Granulocytes: 1 %
LYMPHS PCT: 9 %
Lymphs Abs: 1.1 10*3/uL (ref 0.7–4.0)
MCH: 28.7 pg (ref 26.0–34.0)
MCHC: 31.7 g/dL (ref 30.0–36.0)
MCV: 90.3 fL (ref 78.0–100.0)
Monocytes Absolute: 0.3 10*3/uL (ref 0.1–1.0)
Monocytes Relative: 3 %
NEUTROS ABS: 9.9 10*3/uL — AB (ref 1.7–7.7)
Neutrophils Relative %: 87 %
PLATELETS: 286 10*3/uL (ref 150–400)
RBC: 4.64 MIL/uL (ref 3.87–5.11)
RDW: 13.8 % (ref 11.5–15.5)
WBC: 11.5 10*3/uL — AB (ref 4.0–10.5)

## 2018-05-07 LAB — BRAIN NATRIURETIC PEPTIDE: B NATRIURETIC PEPTIDE 5: 477.3 pg/mL — AB (ref 0.0–100.0)

## 2018-05-07 LAB — BASIC METABOLIC PANEL
Anion gap: 12 (ref 5–15)
BUN: 19 mg/dL (ref 8–23)
CHLORIDE: 97 mmol/L — AB (ref 98–111)
CO2: 27 mmol/L (ref 22–32)
Calcium: 8.4 mg/dL — ABNORMAL LOW (ref 8.9–10.3)
Creatinine, Ser: 0.91 mg/dL (ref 0.44–1.00)
GFR, EST NON AFRICAN AMERICAN: 53 mL/min — AB (ref >60)
Glucose, Bld: 206 mg/dL — ABNORMAL HIGH (ref 70–99)
Potassium: 4.1 mmol/L (ref 3.5–5.1)
SODIUM: 136 mmol/L (ref 135–145)

## 2018-05-07 LAB — TROPONIN I

## 2018-05-07 MED ORDER — IOPAMIDOL (ISOVUE-370) INJECTION 76%
100.0000 mL | Freq: Once | INTRAVENOUS | Status: AC | PRN
  Administered 2018-05-07: 51 mL via INTRAVENOUS

## 2018-05-07 MED ORDER — IOPAMIDOL (ISOVUE-370) INJECTION 76%
INTRAVENOUS | Status: AC
  Filled 2018-05-07: qty 100

## 2018-05-07 NOTE — ED Notes (Signed)
EDP at bedside  

## 2018-05-07 NOTE — ED Triage Notes (Signed)
[facility], pt recently diagnosed with bronchitis and started antibiotic tx. Pt States SOB worsened throughout the day. Pt also generalized weakness. Denies pain, NVD, fever/chills. EMS noted rhonchi and expiratory wheezing, given 10mg  albuterol and 0.5 atrovent. Pt has demand pacer, hx of stroke with left sided deficits. Pt A+Ox4 and in NAD on arrival.

## 2018-05-07 NOTE — ED Notes (Signed)
Pt went to CT

## 2018-05-07 NOTE — ED Provider Notes (Signed)
[facility] EMERGENCY DEPARTMENT Provider Note   CSN: [id number] Arrival date & time: 05/07/18  1846     History   Chief Complaint Chief Complaint  Patient presents with  . Shortness of Breath    HPI Baelynn Jasmer is a 84 y.o. female.  HPI  84 year old female presents with shortness of breath.  Started about 2 weeks ago and has progressively worsened.  She is been having a cough that is mostly nonproductive but occasionally has white sputum.  No chest pain or tightness.  No fevers.  She was placed on antibiotics.  She feels like the shortness breath is a little bit better but not fully improved and she was given albuterol and Atrovent by EMS and feels no relief.  Past Medical History:  Diagnosis Date  . CAD (coronary artery disease)   . Diabetes mellitus without complication (HCC)   . Hyperlipidemia   . Hypertension   . Pacemaker   . Renal artery stenosis (HCC)    s/p bilateral stents  . Sick sinus syndrome (HCC)   . Stroke [facility])    2 previous cva's    Patient Active Problem List   Diagnosis Date Noted  . Sleep disturbance   . Elevated BUN   . Labile blood pressure   . Essential hypertension   . Dysphasia, post-stroke   . PAD (peripheral artery disease) (HCC)   . Hypoalbuminemia due to protein-calorie malnutrition (HCC)   . Diabetic peripheral neuropathy (HCC)   . Diabetes mellitus type 2 in nonobese (HCC)   . Chronic diastolic congestive heart failure (HCC)   . Left thalamic infarction (HCC) 02/26/2018  . Dysphagia, post-stroke   . Acute on chronic diastolic congestive heart failure (HCC)   . Type 2 diabetes mellitus with peripheral neuropathy (HCC)   . TIA (transient ischemic attack) 02/22/2018  . Labile blood glucose   . Hypotension due to drugs   . Hyponatremia   . Renovascular hypertension   . Hypertensive crisis   . Type 2 diabetes mellitus with diabetic autonomic neuropathy, without long-term current use of insulin (HCC)   . Stage 3  chronic kidney disease (HCC)   . Acute lower UTI   . Acute renal insufficiency 09/27/2016  . Urinary urgency 09/27/2016  . Basal ganglia infarction (HCC) 09/26/2016  . Neuropathic pain   . Benign essential HTN   . Slow transit constipation   . Elevated creatine kinase   . Accelerated hypertension 09/24/2016  . Diabetes mellitus (HCC) 09/24/2016  . Hyperlipidemia 09/24/2016  . Sick sinus syndrome (HCC) 09/24/2016  . CVA (cerebral vascular accident) (HCC) 09/23/2016  . Stroke (cerebrum) (HCC) 09/23/2016    Past Surgical History:  Procedure Laterality Date  . PACEMAKER PLACEMENT  05/15/2014   MDT Advisa MRI implanted at [facility] for sick sinus syndrome     OB History    Gravida      Para      Term      Preterm      AB      Living  2     SAB      TAB      Ectopic      Multiple      Live Births               Home Medications    Prior to Admission medications   Medication Sig Start Date End Date Taking? Authorizing Provider  acetaminophen (TYLENOL) 500 MG tablet Take 500 mg by mouth 2 (two)  times daily. Additional Order: Take 500mg  every 12 hours as needed for pain. NTE 2 tablets in 24 hours.   Yes [provider]  amLODipine (NORVASC) 5 MG tablet Take 1 tablet (5 mg total) by mouth 2 (two) times daily. 03/13/18  Yes Angiulli, Mcarthur Rossettianiel J, PA-C  azithromycin (ZITHROMAX) 250 MG tablet Take 250 mg by mouth at bedtime. For 5 days 05/07/18 05/12/18 Yes [provider]  benzonatate (TESSALON) 100 MG capsule Take 100 mg by mouth daily. For 10 days 05/07/18 05/17/18 Yes [provider]  cholecalciferol (VITAMIN D) 1000 units tablet Take 1,000 Units by mouth daily.   Yes [provider]  clopidogrel (PLAVIX) 75 MG tablet Take 1 tablet (75 mg total) by mouth daily. 03/13/18  Yes Angiulli, Mcarthur Rossettianiel J, PA-C  dextromethorphan-guaiFENesin (MUCINEX DM) 30-600 MG 12hr tablet Take 1 tablet by mouth 2 (two) times daily. For 4 weeks 04/11/18 05/09/18 Yes  [provider]  furosemide (LASIX) 20 MG tablet Take 10 mg by mouth daily.   Yes [provider]  glimepiride (AMARYL) 1 MG tablet Take 1 tablet (1 mg total) by mouth daily with breakfast. 03/13/18  Yes Angiulli, Mcarthur Rossettianiel J, PA-C  Ipratropium-Albuterol (COMBIVENT) 20-100 MCG/ACT AERS respimat Inhale 2 puffs into the lungs 2 (two) times daily. For 2 weeks 05/06/18 05/20/18 Yes [provider]  isosorbide mononitrate (IMDUR) 60 MG 24 hr tablet Take 1 tablet (60 mg total) by mouth daily. 10/11/16  Yes Angiulli, Mcarthur Rossettianiel J, PA-C  lisinopril (PRINIVIL,ZESTRIL) 20 MG tablet Take 1 tablet (20 mg total) by mouth 2 (two) times daily. Patient taking differently: Take 30 mg by mouth daily.  03/13/18  Yes Angiulli, Mcarthur Rossettianiel J, PA-C  Magnesium 250 MG TABS Take 250 mg by mouth at bedtime.   Yes [provider]  metoprolol succinate (TOPROL-XL) 100 MG 24 hr tablet Take 1 tablet (100 mg total) by mouth daily. 03/13/18  Yes Angiulli, Mcarthur Rossettianiel J, PA-C  nitroGLYCERIN (NITROSTAT) 0.4 MG SL tablet Place 0.4 mg under the tongue every 5 (five) minutes as needed for chest pain (max 3 doses in 15 minutes).   Yes [provider]  predniSONE (DELTASONE) 5 MG tablet Take 5 mg by mouth once. Additional Orders: Take 10mg  once (05/06/18 @1300 ); Take 15mg  once (05/05/18 @1300 )   Yes [provider]  ranolazine (RANEXA) 500 MG 12 hr tablet Take 1 tablet (500 mg total) by mouth 2 (two) times daily. 10/11/16  Yes Angiulli, Mcarthur Rossettianiel J, PA-C  senna (SENOKOT) 8.6 MG TABS tablet Take 1 tablet by mouth daily as needed for mild constipation. NTE 8.6mg  in 24 hours   Yes [provider]  triamterene-hydrochlorothiazide (MAXZIDE-25) 37.5-25 MG tablet Take 1 tablet by mouth daily. Additional Order: Take 0.5 tablet by mouth on Monday, Wednesday, and Friday. 05/07/18 05/10/18 Yes [provider]  aspirin EC 81 MG EC tablet Take 1 tablet (81 mg total) by mouth daily. Patient not taking: Reported on  05/07/2018 02/27/18   Edsel PetrinMikhail, Maryann, DO  busPIRone (BUSPAR) 5 MG tablet Take 1 tablet (5 mg total) by mouth 2 (two) times daily. Patient not taking: Reported on 05/07/2018 03/13/18   Angiulli, Mcarthur Rossettianiel J, PA-C  ezetimibe (ZETIA) 10 MG tablet Take 1 tablet (10 mg total) by mouth daily. Patient not taking: Reported on 05/07/2018 03/13/18   Angiulli, Mcarthur Rossettianiel J, PA-C  fenofibrate 160 MG tablet Take 1 tablet (160 mg total) by mouth daily. Patient not taking: Reported on 05/07/2018 10/11/16   Angiulli, Mcarthur Rossettianiel J, PA-C  Family History Family History  Problem Relation Age of Onset  . Lung cancer Sister   . Hypertension Other   . CAD Neg Hx   . Stroke Neg Hx     Social History Social History   Tobacco Use  . Smoking status: Never Smoker  . Smokeless tobacco: Never Used  Substance Use Topics  . Alcohol use: No  . Drug use: Never     Allergies   Statins; Other; Amlodipine; Celecoxib; Codeine; Naproxen; and Piroxicam   Review of Systems Review of Systems  Constitutional: Negative for fever.  Respiratory: Positive for cough and shortness of breath. Negative for chest tightness.   Cardiovascular: Negative for chest pain and leg swelling.  All other systems reviewed and are negative.    Physical Exam Updated Vital Signs BP (!) 165/67   Pulse 66   Temp 97.7 F (36.5 C) (Axillary)   Resp 17   SpO2 91%   Physical Exam  Constitutional: She is oriented to person, place, and time. She appears well-developed and well-nourished.  Non-toxic appearance. She does not appear ill. No distress.  HENT:  Head: Normocephalic and atraumatic.  Right Ear: External ear normal.  Left Ear: External ear normal.  Nose: Nose normal.  Eyes: Right eye exhibits no discharge. Left eye exhibits no discharge.  Cardiovascular: Normal rate, regular rhythm and normal heart sounds.  Pulmonary/Chest: Effort normal. She has wheezes (diffuse, expiratory).  Abdominal: Soft. There is no tenderness.  Musculoskeletal:         Right lower leg: She exhibits edema (trace).       Left lower leg: She exhibits edema (trace).  Neurological: She is alert and oriented to person, place, and time.  Skin: Skin is warm and dry.  Nursing note and vitals reviewed.    ED Treatments / Results  Labs (all labs ordered are listed, but only abnormal results are displayed) Labs Reviewed  BRAIN NATRIURETIC PEPTIDE - Abnormal; Notable for the following components:      Result Value   B Natriuretic Peptide 477.3 (*)    All other components within normal limits  CBC WITH DIFFERENTIAL/PLATELET - Abnormal; Notable for the following components:   WBC 11.5 (*)    Neutro Abs 9.9 (*)    Abs Immature Granulocytes 0.2 (*)    All other components within normal limits  BASIC METABOLIC PANEL - Abnormal; Notable for the following components:   Chloride 97 (*)    Glucose, Bld 206 (*)    Calcium 8.4 (*)    GFR calc non Af Amer 53 (*)    All other components within normal limits  TROPONIN I    EKG EKG Interpretation  Date/Time:  Wednesday May 07 2018 19:06:00 EDT Ventricular Rate:  72 PR Interval:    QRS Duration: 100 QT Interval:  402 QTC Calculation: 440 R Axis:   -14 Text Interpretation:  Sinus rhythm Low voltage, precordial leads Minimal ST depression, lateral leads Confirmed by Pricilla Loveless [phone]) on 05/07/2018 7:47:51 PM   Radiology Dg Chest 2 View  Result Date: 05/07/2018 CLINICAL DATA:  Shortness of breath for the past 2 weeks. Generalized weakness. EXAM: CHEST - 2 VIEW COMPARISON:  02/25/2018; 02/22/2018; 09/28/2016 09/23/2016 FINDINGS: Examination is degraded due to patient body habitus and decreased lung volumes. Grossly unchanged enlarged cardiac silhouette and mediastinal contours with atherosclerotic plaque within the thoracic aorta. Stable position of support apparatus. Veiling opacities overlying the bilateral lower lungs are favored to represent overlying soft tissues and worsening bibasilar atelectasis.  No discrete focal airspace opacities. No pleural effusion or pneumothorax. No definite evidence of edema. No acute osseous abnormalities. Moderate to severe degenerative change of the bilateral glenohumeral joints, incompletely evaluated. IMPRESSION: Bibasilar atelectasis without definitive superimposed acute cardiopulmonary disease on this hypoventilated body habitus degraded examination. Electronically Signed   By: Simonne Come M.D.   On: 05/07/2018 20:04    Procedures Procedures (including critical care time)  Medications Ordered in ED Medications - No data to display   Initial Impression / Assessment and Plan / ED Course  I have reviewed the triage vital signs and the nursing notes.  Pertinent labs & imaging results that were available during my care of the patient were reviewed by me and considered in my medical decision making (see chart for details).     Patient does not feel like she got better after albuterol by EMS.  She does have coarse wheezing bilaterally.  However she is requiring oxygen at about 3 L which is new for her.  Discussed with Dr. Toniann Fail, given somewhat unclear presentation, will get CT scan to rule out PE and other acute lung pathology.  She will need admission.  Hold on Lasix until further CT scan imaging.  Final Clinical Impressions(s) / ED Diagnoses   Final diagnoses:  Acute respiratory failure with hypoxia [facility])    ED Discharge Orders    None       Pricilla Loveless, MD 05/07/18 2254

## 2018-05-08 ENCOUNTER — Encounter (HOSPITAL_COMMUNITY): Payer: Self-pay | Admitting: Internal Medicine

## 2018-05-08 ENCOUNTER — Inpatient Hospital Stay (HOSPITAL_COMMUNITY): Payer: Medicare Other

## 2018-05-08 DIAGNOSIS — J189 Pneumonia, unspecified organism: Secondary | ICD-10-CM | POA: Diagnosis present

## 2018-05-08 LAB — GLUCOSE, CAPILLARY
GLUCOSE-CAPILLARY: 135 mg/dL — AB (ref 70–99)
Glucose-Capillary: 141 mg/dL — ABNORMAL HIGH (ref 70–99)
Glucose-Capillary: 129 mg/dL — ABNORMAL HIGH (ref 70–99)
Glucose-Capillary: 137 mg/dL — ABNORMAL HIGH (ref 70–99)
Glucose-Capillary: 131 mg/dL — ABNORMAL HIGH (ref 70–99)

## 2018-05-08 LAB — BASIC METABOLIC PANEL
ANION GAP: 9 (ref 5–15)
BUN: 13 mg/dL (ref 8–23)
CHLORIDE: 100 mmol/L (ref 98–111)
CO2: 30 mmol/L (ref 22–32)
Calcium: 8.5 mg/dL — ABNORMAL LOW (ref 8.9–10.3)
Creatinine, Ser: 0.74 mg/dL (ref 0.44–1.00)
GFR calc Af Amer: >60 mL/min (ref >60)
GFR calc non Af Amer: >60 mL/min (ref >60)
Glucose, Bld: 130 mg/dL — ABNORMAL HIGH (ref 70–99)
POTASSIUM: 3.8 mmol/L (ref 3.5–5.1)
Sodium: 139 mmol/L (ref 135–145)

## 2018-05-08 LAB — MRSA PCR SCREENING: MRSA by PCR: NEGATIVE

## 2018-05-08 LAB — CBC
HEMATOCRIT: 38.4 % (ref 36.0–46.0)
HEMOGLOBIN: 12.4 g/dL (ref 12.0–15.0)
MCH: 28.8 pg (ref 26.0–34.0)
MCHC: 32.3 g/dL (ref 30.0–36.0)
MCV: 89.1 fL (ref 78.0–100.0)
Platelets: 280 10*3/uL (ref 150–400)
RBC: 4.31 MIL/uL (ref 3.87–5.11)
RDW: 13.4 % (ref 11.5–15.5)
WBC: 10.1 10*3/uL (ref 4.0–10.5)

## 2018-05-08 LAB — STREP PNEUMONIAE URINARY ANTIGEN: Strep Pneumo Urinary Antigen: NEGATIVE

## 2018-05-08 LAB — TROPONIN I

## 2018-05-08 MED ORDER — SODIUM CHLORIDE 0.9 % IV SOLN
1.0000 g | INTRAVENOUS | Status: DC
  Administered 2018-05-08 – 2018-05-12 (×5): 1 g via INTRAVENOUS
  Filled 2018-05-08 (×5): qty 1

## 2018-05-08 MED ORDER — NITROGLYCERIN 0.4 MG SL SUBL: 0.4000 mg | SUBLINGUAL_TABLET | SUBLINGUAL | Status: DC | PRN

## 2018-05-08 MED ORDER — HYDRALAZINE HCL 20 MG/ML IJ SOLN
10.0000 mg | INTRAMUSCULAR | Status: DC | PRN
  Filled 2018-05-08: qty 1

## 2018-05-08 MED ORDER — IPRATROPIUM-ALBUTEROL 0.5-2.5 (3) MG/3ML IN SOLN
3.0000 mL | Freq: Four times a day (QID) | RESPIRATORY_TRACT | Status: DC
  Administered 2018-05-08 – 2018-05-10 (×6): 3 mL via RESPIRATORY_TRACT
  Filled 2018-05-08 (×6): qty 3

## 2018-05-08 MED ORDER — ACETAMINOPHEN 650 MG RE SUPP: 650.0000 mg | Freq: Four times a day (QID) | RECTAL | Status: DC | PRN

## 2018-05-08 MED ORDER — MAGNESIUM OXIDE 400 (241.3 MG) MG PO TABS
400.0000 mg | ORAL_TABLET | Freq: Every day | ORAL | Status: DC
  Administered 2018-05-08 – 2018-05-13 (×7): 400 mg via ORAL
  Filled 2018-05-08 (×7): qty 1

## 2018-05-08 MED ORDER — ENOXAPARIN SODIUM 40 MG/0.4ML ~~LOC~~ SOLN
40.0000 mg | SUBCUTANEOUS | Status: DC
Start: 2018-05-08 — End: 2018-05-13
  Administered 2018-05-09 – 2018-05-13 (×5): 40 mg via SUBCUTANEOUS
  Filled 2018-05-08 (×6): qty 0.4

## 2018-05-08 MED ORDER — SODIUM CHLORIDE 0.9 % IV SOLN: 1.0000 g | Freq: Three times a day (TID) | INTRAVENOUS | Status: DC

## 2018-05-08 MED ORDER — CLOPIDOGREL BISULFATE 75 MG PO TABS
75.0000 mg | ORAL_TABLET | Freq: Every day | ORAL | Status: DC
  Administered 2018-05-08 – 2018-05-13 (×6): 75 mg via ORAL
  Filled 2018-05-08 (×6): qty 1

## 2018-05-08 MED ORDER — ISOSORBIDE MONONITRATE ER 60 MG PO TB24
60.0000 mg | ORAL_TABLET | Freq: Every day | ORAL | Status: DC
  Administered 2018-05-08 – 2018-05-13 (×6): 60 mg via ORAL
  Filled 2018-05-08 (×6): qty 1

## 2018-05-08 MED ORDER — METOPROLOL SUCCINATE ER 100 MG PO TB24
100.0000 mg | ORAL_TABLET | Freq: Every day | ORAL | Status: DC
  Administered 2018-05-08 – 2018-05-13 (×6): 100 mg via ORAL
  Filled 2018-05-08 (×6): qty 1

## 2018-05-08 MED ORDER — VANCOMYCIN HCL 10 G IV SOLR
1500.0000 mg | Freq: Once | INTRAVENOUS | Status: AC
  Administered 2018-05-08: 1500 mg via INTRAVENOUS
  Filled 2018-05-08: qty 1500

## 2018-05-08 MED ORDER — AMLODIPINE BESYLATE 5 MG PO TABS
5.0000 mg | ORAL_TABLET | Freq: Two times a day (BID) | ORAL | Status: DC
  Administered 2018-05-08 – 2018-05-13 (×13): 5 mg via ORAL
  Filled 2018-05-08 (×13): qty 1

## 2018-05-08 MED ORDER — LISINOPRIL 20 MG PO TABS
30.0000 mg | ORAL_TABLET | Freq: Every day | ORAL | Status: DC
  Administered 2018-05-08 – 2018-05-13 (×6): 30 mg via ORAL
  Filled 2018-05-08 (×6): qty 1

## 2018-05-08 MED ORDER — FUROSEMIDE 20 MG PO TABS
10.0000 mg | ORAL_TABLET | Freq: Every day | ORAL | Status: DC
  Administered 2018-05-08: 10 mg via ORAL
  Filled 2018-05-08: qty 1

## 2018-05-08 MED ORDER — FUROSEMIDE 10 MG/ML IJ SOLN
20.0000 mg | Freq: Once | INTRAMUSCULAR | Status: AC
  Administered 2018-05-08: 20 mg via INTRAVENOUS
  Filled 2018-05-08: qty 2

## 2018-05-08 MED ORDER — RANOLAZINE ER 500 MG PO TB12
500.0000 mg | ORAL_TABLET | Freq: Two times a day (BID) | ORAL | Status: DC
  Administered 2018-05-08 – 2018-05-13 (×13): 500 mg via ORAL
  Filled 2018-05-08 (×14): qty 1

## 2018-05-08 MED ORDER — ONDANSETRON HCL 4 MG/2ML IJ SOLN: 4.0000 mg | Freq: Four times a day (QID) | INTRAMUSCULAR | Status: DC | PRN

## 2018-05-08 MED ORDER — INSULIN ASPART 100 UNIT/ML ~~LOC~~ SOLN
0.0000 [IU] | Freq: Three times a day (TID) | SUBCUTANEOUS | Status: DC
  Administered 2018-05-09: 2 [IU] via SUBCUTANEOUS
  Administered 2018-05-10: 5 [IU] via SUBCUTANEOUS
  Administered 2018-05-10: 2 [IU] via SUBCUTANEOUS
  Administered 2018-05-10 – 2018-05-11 (×3): 1 [IU] via SUBCUTANEOUS
  Administered 2018-05-11: 5 [IU] via SUBCUTANEOUS
  Administered 2018-05-12: 2 [IU] via SUBCUTANEOUS
  Administered 2018-05-12: 5 [IU] via SUBCUTANEOUS
  Administered 2018-05-13 (×3): 1 [IU] via SUBCUTANEOUS

## 2018-05-08 MED ORDER — VANCOMYCIN HCL IN DEXTROSE 1-5 GM/200ML-% IV SOLN
1000.0000 mg | INTRAVENOUS | Status: DC
  Administered 2018-05-10: 1000 mg via INTRAVENOUS
  Filled 2018-05-08: qty 200

## 2018-05-08 MED ORDER — IPRATROPIUM-ALBUTEROL 0.5-2.5 (3) MG/3ML IN SOLN
3.0000 mL | Freq: Two times a day (BID) | RESPIRATORY_TRACT | Status: DC
  Administered 2018-05-08: 3 mL via RESPIRATORY_TRACT
  Filled 2018-05-08: qty 3

## 2018-05-08 MED ORDER — ONDANSETRON HCL 4 MG PO TABS: 4.0000 mg | ORAL_TABLET | Freq: Four times a day (QID) | ORAL | Status: DC | PRN

## 2018-05-08 MED ORDER — ACETAMINOPHEN 325 MG PO TABS: 650.0000 mg | ORAL_TABLET | Freq: Four times a day (QID) | ORAL | Status: DC | PRN

## 2018-05-08 MED ORDER — TRIAMTERENE-HCTZ 37.5-25 MG PO TABS
1.0000 | ORAL_TABLET | Freq: Every day | ORAL | Status: DC
  Administered 2018-05-08 – 2018-05-13 (×6): 1 via ORAL
  Filled 2018-05-08 (×7): qty 1

## 2018-05-08 MED ORDER — SENNA 8.6 MG PO TABS: 1.0000 | ORAL_TABLET | Freq: Every day | ORAL | Status: DC | PRN

## 2018-05-08 NOTE — Progress Notes (Signed)
Pharmacy Antibiotic Note  Charlene Greene is a 84 y.o. female admitted on 05/07/2018 with pneumonia.  Pharmacy has been consulted for vancomycin dosing.  Plan: Vancomycin 1500 mg IV x 1 then 1gm q48 hours Adjust cefepime for renal function F/u renal function, cultures and clinical course  Height: 5' (152.4 cm) Weight: 164 lb 14.4 oz (74.8 kg) IBW/kg (Calculated) : 45.5  Temp (24hrs), Avg:97.6 F (36.4 C), Min:97.5 F (36.4 C), Max:97.7 F (36.5 C)  Recent Labs  Lab 05/07/18 1926 05/07/18 2111  WBC 11.5*  --   CREATININE  --  0.91    Estimated Creatinine Clearance: 34.9 mL/min (by C-G formula based on SCr of 0.91 mg/dL).    Allergies  Allergen Reactions  . Statins Other (See Comments)    Intol to numerous statins still feels has some muscle weakness, myalgia  . Other Other (See Comments)    [facility]  . Amlodipine Other (See Comments)    unknown  . Celecoxib Nausea Only and Other (See Comments)  . Codeine Other (See Comments)    unknown  . Naproxen Other (See Comments)    unknown  . Piroxicam Other (See Comments)    unknown    Thank you for allowing pharmacy to be a part of this patient's care.  Talbert CageSeay, [name] 05/08/2018 1:51 AM

## 2018-05-08 NOTE — Progress Notes (Signed)
PROGRESS NOTE  Charlene Greene NFA:[id number] DOB: [birth year] DOA: 05/07/2018 PCP: Almetta Lovely, [facility]  HPI  Charlene Greene is a 84 y.o. year old female with medical history significant for CVA (02/2018), sick sinus syndrome status post pacemaker placement, renal artery stenosis status post stent, CAD, diabetes mellitus type 2, hypertension who presented on 05/07/2018 with worsening nonproductive cough shortness of breath for 2 weeks and was found to have acute hypoxic respiratory failure secondary to right lower lobe pneumonia of unclear etiology.  Interval History    ROS:    Subjective Still coughing but not productive.   Assessment/Plan: Principal Problem:   HCAP (healthcare-associated pneumonia) Active Problems:   Stage 3 chronic kidney disease (HCC)   Type 2 diabetes mellitus with peripheral neuropathy (HCC)   Left thalamic infarction (HCC)   Diabetes mellitus type 2 in nonobese (HCC)   Chronic diastolic congestive heart failure (HCC)   PAD (peripheral artery disease) (HCC)   Acute respiratory failure with hypoxia (HCC)  Acute hypoxic respiratory failure, likely secondary to right lower lobe pneumonia.  Unclear healthcare associated versus aspiration.  Swallow evaluation shows some oropharyngeal dysphasia.  Continue empiric vancomycin and cefepime.  Monitor sputum cultures strep pneumo negative, pending Legionella.  Nonproductive cough, no sputum culture collected. CT imaging suggest possible postobstructive pneumonia or worsening atelectasis.  Supportive care with flutter valve, O2, mobilization of secretions/bed to chair duo nebs QID.  If no improvement in respiratory status may need to consider pulmonary consultation.  Hypertension, stable.  Continue home Norvasc, lisinopril, Imdur, Maxzide and Toprol.  Diastolic CHF.  BNP slightly elevated greater than 400.  Last TTE 02/2017 with preserved EF and grade 1 diastolic dysfunction.  No worsening peripheral edema on exam.   Monitor daily weights.  Continue low-dose home Lasix.  Type 2 diabetes.  A1c 7.  Holding home Amaryl.  Sliding scale coverage for now.  CVA on 02/2018.  Continue Plavix, Zetia, fenofibrate.  Sick sinus syndrome status post his pacemaker placement.  Monitor telemetry  CAD.  Stable.  Denies any chest pain. Troponin negative. Imdur, ranexa   Code Status:DNR  Family Communication: Daughter at bedside   Disposition Plan: IV antibiotics, culture data, improvement in respiratory status   Consultants:  none  Procedures:  none   Antimicrobials: Anti-infectives (From admission, onward)   Start     Dose/Rate Route Frequency Ordered Stop   05/10/18 0200  vancomycin (VANCOCIN) IVPB 1000 mg/200 mL premix     1,000 mg 200 mL/hr over 60 Minutes Intravenous Every 48 hours 05/08/18 0152     05/08/18 0600  ceFEPIme (MAXIPIME) 1 g in sodium chloride 0.9 % 100 mL IVPB  Status:  Discontinued     1 g 200 mL/hr over 30 Minutes Intravenous Every 8 hours 05/08/18 0145 05/08/18 0153   05/08/18 0200  vancomycin (VANCOCIN) 1,500 mg in sodium chloride 0.9 % 500 mL IVPB     1,500 mg 250 mL/hr over 120 Minutes Intravenous  Once 05/08/18 0150 05/08/18 0453   05/08/18 0200  ceFEPIme (MAXIPIME) 1 g in sodium chloride 0.9 % 100 mL IVPB     1 g 200 mL/hr over 30 Minutes Intravenous Every 24 hours 05/08/18 0153 05/16/18 0159         Cultures:  Strep pneumo negative  Legionella pending  Sputum culture-pending (not collected, nonproductive cough)  Telemetry:  DVT prophylaxis: Lovenox   Objective: Vitals:   05/08/18 0300 05/08/18 0430 05/08/18 0832 05/08/18 1217  BP: (!) 167/77   (!) 146/78  Pulse:  69   64  Resp: 18   18  Temp: 98.1 F (36.7 C)   97.6 F (36.4 C)  TempSrc: Oral   Oral  SpO2:  95% 94%   Weight:      Height:        Intake/Output Summary (Last 24 hours) at 05/08/2018 1636 Last data filed at 05/08/2018 1500 Gross per 24 hour  Intake 463.7 ml  Output 600 ml  Net  -136.3 ml   Filed Weights   05/07/18 2357 05/08/18 0005  Weight: 74.8 kg (164 lb 14.4 oz) 74.8 kg (164 lb 14.4 oz)    Exam:  Constitutional:normal appearing female, in no distress Eyes: EOMI, anicteric, normal conjunctivae ENMT: Oropharynx with moist mucous membranes, normal dentition Cardiovascular: RRR no MRGs, with no peripheral edema Respiratory: Normal respiratory effort on 2 L [place], diffuse rhonchi throughout with lots of upper airway sounds  Abdomen: Soft,non-tender,  Skin: No rash ulcers, or lesions. Without skin tenting  Neurologic: Grossly no focal neuro deficit. Psychiatric:Appropriate affect, and mood. Mental status AAOx3  Data Reviewed: CBC: Recent Labs  Lab 05/07/18 1926 05/08/18 0634  WBC 11.5* 10.1  NEUTROABS 9.9*  --   HGB 13.3 12.4  HCT 41.9 38.4  MCV 90.3 89.1  PLT 286 280   Basic Metabolic Panel: Recent Labs  Lab 05/07/18 2111 05/08/18 0634  NA 136 139  K 4.1 3.8  CL 97* 100  CO2 27 30  GLUCOSE 206* 130*  BUN 19 13  CREATININE 0.91 0.74  CALCIUM 8.4* 8.5*   GFR: Estimated Creatinine Clearance: 39.7 mL/min (by C-G formula based on SCr of 0.74 mg/dL). Liver Function Tests: No results for input(s): AST, ALT, ALKPHOS, BILITOT, PROT, ALBUMIN in the last 168 hours. No results for input(s): LIPASE, AMYLASE in the last 168 hours. No results for input(s): AMMONIA in the last 168 hours. Coagulation Profile: No results for input(s): INR, PROTIME in the last 168 hours. Cardiac Enzymes: Recent Labs  Lab 05/07/18 2111 05/08/18 0634  TROPONINI <0.03 <0.03   BNP (last 3 results) No results for input(s): PROBNP in the last 8760 hours. HbA1C: No results for input(s): HGBA1C in the last 72 hours. CBG: Recent Labs  Lab 05/08/18 0407 05/08/18 0754 05/08/18 1222  GLUCAP 141* 129* 137*   Lipid Profile: No results for input(s): CHOL, HDL, LDLCALC, TRIG, CHOLHDL, LDLDIRECT in the last 72 hours. Thyroid Function Tests: No results for input(s): TSH,  T4TOTAL, FREET4, T3FREE, THYROIDAB in the last 72 hours. Anemia Panel: No results for input(s): VITAMINB12, FOLATE, FERRITIN, TIBC, IRON, RETICCTPCT in the last 72 hours. Urine analysis:    Component Value Date/Time   COLORURINE YELLOW 02/22/2018 2000   APPEARANCEUR CLEAR 02/22/2018 2000   LABSPEC 1.013 02/22/2018 2000   PHURINE 6.0 02/22/2018 2000   GLUCOSEU 50 (A) 02/22/2018 2000   HGBUR NEGATIVE 02/22/2018 2000   BILIRUBINUR NEGATIVE 02/22/2018 2000   KETONESUR NEGATIVE 02/22/2018 2000   PROTEINUR NEGATIVE 02/22/2018 2000   NITRITE NEGATIVE 02/22/2018 2000   LEUKOCYTESUR MODERATE (A) 02/22/2018 2000   Sepsis Labs: @LABRCNTIP (procalcitonin:4,lacticidven:4)  ) Recent Results (from the past 240 hour(s))  MRSA PCR Screening     Status: None   Collection Time: 05/08/18  3:23 AM  Result Value Ref Range Status   MRSA by PCR NEGATIVE NEGATIVE Final    Comment:        The GeneXpert MRSA Assay (FDA approved for NASAL specimens only), is one component of a comprehensive MRSA colonization surveillance program. It is not intended  to diagnose MRSA infection nor to guide or monitor treatment for MRSA infections. Performed at [facility], [address] [place], [place] [address]       Studies: Dg Chest 2 View  Result Date: 05/07/2018 CLINICAL DATA:  Shortness of breath for the past 2 weeks. Generalized weakness. EXAM: CHEST - 2 VIEW COMPARISON:  02/25/2018; 02/22/2018; 09/28/2016 09/23/2016 FINDINGS: Examination is degraded due to patient body habitus and decreased lung volumes. Grossly unchanged enlarged cardiac silhouette and mediastinal contours with atherosclerotic plaque within the thoracic aorta. Stable position of support apparatus. Veiling opacities overlying the bilateral lower lungs are favored to represent overlying soft tissues and worsening bibasilar atelectasis. No discrete focal airspace opacities. No pleural effusion or pneumothorax. No definite evidence of  edema. No acute osseous abnormalities. Moderate to severe degenerative change of the bilateral glenohumeral joints, incompletely evaluated. IMPRESSION: Bibasilar atelectasis without definitive superimposed acute cardiopulmonary disease on this hypoventilated body habitus degraded examination. Electronically Signed   By: Simonne ComeJohn  Watts M.D.   On: 05/07/2018 20:04   Ct Angio Chest Pe W/cm &/or Wo Cm  Result Date: 05/08/2018 CLINICAL DATA:  84 year old female with shortness of breath. EXAM: CT ANGIOGRAPHY CHEST WITH CONTRAST TECHNIQUE: Multidetector CT imaging of the chest was performed using the standard protocol during bolus administration of intravenous contrast. Multiplanar CT image reconstructions and MIPs were obtained to evaluate the vascular anatomy. CONTRAST:  51mL ISOVUE-370 IOPAMIDOL (ISOVUE-370) INJECTION 76% COMPARISON:  Chest radiograph dated 05/07/2018 FINDINGS: Cardiovascular: There is mild cardiomegaly. Multi vessel coronary vascular calcification. No pericardial effusion. There is a left pectoral pacemaker device. There is moderate atherosclerotic calcification of thoracic aorta. No aneurysmal dilatation or evidence of dissection. In the there is no CT evidence of pulmonary embolism. Mediastinum/Nodes: There is no hilar or mediastinal adenopathy. There is a small hiatal hernia. The esophagus is grossly unremarkable. No mediastinal fluid collection. Lungs/Pleura: There is a small to moderate right pleural effusion. There is consolidative changes of the region of the of the right lower lobe. Endobronchial fluid collection is noted in the right lower lobe bronchus left lung base subsegmental atelectasis/scarring versus infiltrate. There is no pneumothorax. 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 Upper Abdomen: Atrophic pancreas. The visualized upper abdomen is otherwise unremarkable. Musculoskeletal: Degenerative changes of the spine. No acute osseous pathology. Rounded ossific densities in the region of the  shoulder may represent PVNS, synovial osteochondromatosis, or less likely intra-articular loose bodies. Review of the MIP images confirms the above findings. IMPRESSION: 1. No CT evidence of pulmonary embolism. 2. Consolidative changes of the majority of the right lower lobe concerning for postobstructive atelectasis versus pneumonia. Aspiration is not excluded. There is mucus secretion in the right lower lobe bronchus with associated luminal narrowing. Subsegmental left lung base atelectasis versus infiltrate. 3. Cardiomegaly with coronary vascular calcification. 4.  Aortic Atherosclerosis (ICD10-I70.0). Electronically Signed   By: Elgie CollardArash  Radparvar M.D.   On: 05/08/2018 00:17   Dg Swallowing Func-speech Pathology  Result Date: 05/08/2018 Objective Swallowing Evaluation: Type of Study: MBS-Modified Barium Swallow Study  Patient Details Name: Susa Griffinsell Rossel MRN: [id number] Date of Birth: 1922/10/16 Today's Date: 05/08/2018 Time: SLP Start Time (ACUTE ONLY): 1300 -SLP Stop Time (ACUTE ONLY): 1325 SLP Time Calculation (min) (ACUTE ONLY): 25 min Past Medical History: Past Medical History: Diagnosis Date . CAD (coronary artery disease)  . Diabetes mellitus without complication (HCC)  . Hyperlipidemia  . Hypertension  . Pacemaker  . Renal artery stenosis (HCC)   s/p bilateral stents . Sick sinus syndrome (HCC)  .  Stroke [facility])   2 previous cva's Past Surgical History: Past Surgical History: Procedure Laterality Date . PACEMAKER PLACEMENT  05/15/2014  MDT Advisa MRI implanted at [facility] for sick sinus syndrome HPI: Pt is a 84 y.o. female PMH recent stroke in April 2019, sick sinus syndrome status post pacemaker placement, renal artery stenosis status post stent, CAD, diabetes mellitus type 2, hypertension presents to the ER after patient has benign persistent cough with shortness of breath for the last 2 weeks.  Patient states she gets short of breath on coughing.  Denies any chest pain.  Has been having productive sputum.  CXR showed "Bibasilar atelectasis without definitive superimposed acute cardiopulmonary disease on this hypoventilated body habitus degraded examination." Chest CT showed Consolidative changes of the majority of the right lower lobe concerning for postobstructive atelectasis versus pneumonia. Aspiration is not excluded. There is mucus secretion in the right lower lobe bronchus with associated luminal narrowing. Subsegmental left lung base atelectasis versus infiltrate." Pt had MBS on 02/25/18 and again on 03/05/18. On 4/16 Pt had aspiration of thin liquids and penetration of nectar thick liquids (oral control/ premature spill); recommendation for dysphagia 3/ nectar thick. On 4/24 repeat MBS pt showed only flash penetration and advanced to thin liquids. Upon discharge from inpatient rehab pt was on a regular/ thin liquid diet.   Subjective: pt pleasant, cooperative Assessment / Plan / Recommendation CHL IP CLINICAL IMPRESSIONS 05/08/2018 Clinical Impression Pt currently with a mild oropharyngeal and suspect esophageal phase dysphagia. Oropharyngeal phase characterized by occasional tongue pumping and piecemeal swallowing, delay in swallow initiation to the level of the pyriform sinuses and mildly reduced hyolaryngeal excursion; pt had penetration to the level of the vocal folds; penetration was not captured on video but noted later lining of the VFs- this could represent penetration of post-swallow residuals but pt did sense and cough to effectively clear and was an isolated occurrence. Mild residuals noted across consistencies which were cleared with 2nd swallow. Barium tablet became lodged in vallecula and was cleared with follow-up puree bolus. Esophageal sweep revealed stasis of barium material to the mid-upper level of the esophagus. For esophageal efficiency, recommend dysphagia 3 diet/ thin liquids, meds whole in puree, reflux precautions- sit upright 30-60 minutes after meal, small bites/ sips, alternate food  with liquid. Pt may benefit from further esophageal assessment. Will continue to follow for diet tolerance/ compensatory strategy training. SLP Visit Diagnosis Dysphagia, oropharyngeal phase (R13.12) Attention and concentration deficit following -- Frontal lobe and executive function deficit following -- Impact on safety and function Mild aspiration risk   CHL IP TREATMENT RECOMMENDATION 05/08/2018 Treatment Recommendations Therapy as outlined in treatment plan below   Prognosis 05/08/2018 Prognosis for Safe Diet Advancement Fair Barriers to Reach Goals (No Data) Barriers/Prognosis Comment -- CHL IP DIET RECOMMENDATION 05/08/2018 SLP Diet Recommendations Dysphagia 3 (Mech soft) solids;Thin liquid Liquid Administration via Cup;Straw Medication Administration Whole meds with puree Compensations Slow rate;Small sips/bites;Follow solids with liquid Postural Changes Remain semi-upright after after feeds/meals (Comment);Seated upright at 90 degrees   CHL IP OTHER RECOMMENDATIONS 05/08/2018 Recommended Consults (No Data) Oral Care Recommendations Oral care BID Other Recommendations --   CHL IP FOLLOW UP RECOMMENDATIONS 05/08/2018 Follow up Recommendations Other (comment)   CHL IP FREQUENCY AND DURATION 05/08/2018 Speech Therapy Frequency (ACUTE ONLY) min 2x/week Treatment Duration 1 week      CHL IP ORAL PHASE 05/08/2018 Oral Phase Impaired Oral - Pudding Teaspoon -- Oral - Pudding Cup -- Oral - Honey Teaspoon -- Oral - Honey Cup --  Oral - Nectar Teaspoon -- Oral - Nectar Cup -- Oral - Nectar Straw -- Oral - Thin Teaspoon -- Oral - Thin Cup Lingual/palatal residue;Piecemeal swallowing;Premature spillage Oral - Thin Straw Piecemeal swallowing;Premature spillage Oral - Puree Lingual pumping;Piecemeal swallowing Oral - Mech Soft -- Oral - Regular Lingual pumping;Piecemeal swallowing Oral - Multi-Consistency -- Oral - Pill WFL Oral Phase - Comment --  CHL IP PHARYNGEAL PHASE 05/08/2018 Pharyngeal Phase Impaired Pharyngeal- Pudding  Teaspoon -- Pharyngeal -- Pharyngeal- Pudding Cup -- Pharyngeal -- Pharyngeal- Honey Teaspoon -- Pharyngeal -- Pharyngeal- Honey Cup -- Pharyngeal -- Pharyngeal- Nectar Teaspoon -- Pharyngeal -- Pharyngeal- Nectar Cup -- Pharyngeal -- Pharyngeal- Nectar Straw -- Pharyngeal -- Pharyngeal- Thin Teaspoon -- Pharyngeal -- Pharyngeal- Thin Cup Delayed swallow initiation-pyriform sinuses;Penetration/Apiration after swallow;Pharyngeal residue - valleculae Pharyngeal Material enters airway, CONTACTS cords and then ejected out Pharyngeal- Thin Straw Delayed swallow initiation-pyriform sinuses;Penetration/Aspiration before swallow Pharyngeal Material enters airway, remains ABOVE vocal cords then ejected out Pharyngeal- Puree Pharyngeal residue - valleculae Pharyngeal -- Pharyngeal- Mechanical Soft -- Pharyngeal -- Pharyngeal- Regular Pharyngeal residue - valleculae Pharyngeal -- Pharyngeal- Multi-consistency -- Pharyngeal -- Pharyngeal- Pill Pharyngeal residue - valleculae Pharyngeal -- Pharyngeal Comment --  CHL IP CERVICAL ESOPHAGEAL PHASE 05/08/2018 Cervical Esophageal Phase Impaired Pudding Teaspoon -- Pudding Cup -- Honey Teaspoon -- Honey Cup -- Nectar Teaspoon -- Nectar Cup -- Nectar Straw -- Thin Teaspoon -- Thin Cup -- Thin Straw -- Puree -- Mechanical Soft -- Regular -- Multi-consistency -- Pill -- Cervical Esophageal Comment (No Data) No flowsheet data found. Amy Cecille Aver, MA, CCC-SLP 05/08/2018, 3:33 PM [phone]               Scheduled Meds: . amLODipine  5 mg Oral BID  . clopidogrel  75 mg Oral Daily  . enoxaparin (LOVENOX) injection  40 mg Subcutaneous Q24H  . furosemide  10 mg Oral Daily  . insulin aspart  0-9 Units Subcutaneous TID WC  . ipratropium-albuterol  3 mL Inhalation BID  . isosorbide mononitrate  60 mg Oral Daily  . lisinopril  30 mg Oral Daily  . magnesium oxide  400 mg Oral QHS  . metoprolol succinate  100 mg Oral Daily  . ranolazine  500 mg Oral BID  . triamterene-hydrochlorothiazide   1 tablet Oral Daily    Continuous Infusions: . ceFEPime (MAXIPIME) IV 1 g (05/08/18 0231)  . [START ON 05/10/2018] vancomycin       LOS: 1 day     Laverna Peace, MD [facility] Pager [phone]  If 7PM-7AM, please contact night-coverage [email or web address] Password [facility] 05/08/2018, 4:36 PM

## 2018-05-08 NOTE — Evaluation (Signed)
Clinical/Bedside Swallow Evaluation Patient Details  Name: Charlene Greene MRN: [id number] Date of Birth: 08/18/1922  Today's Date: 05/08/2018 Time: SLP Start Time (ACUTE ONLY): 0850 SLP Stop Time (ACUTE ONLY): 0910 SLP Time Calculation (min) (ACUTE ONLY): 20 min  Past Medical History:  Past Medical History:  Diagnosis Date  . CAD (coronary artery disease)   . Diabetes mellitus without complication (HCC)   . Hyperlipidemia   . Hypertension   . Pacemaker   . Renal artery stenosis (HCC)    s/p bilateral stents  . Sick sinus syndrome (HCC)   . Stroke [facility](HCC)    2 previous cva's   Past Surgical History:  Past Surgical History:  Procedure Laterality Date  . PACEMAKER PLACEMENT  05/15/2014   MDT Advisa MRI implanted [facility] for sick sinus syndrome   HPI:  Pt is a 84 y.o. female PMH recent stroke in April 2019, sick sinus syndrome status post pacemaker placement, renal artery stenosis status post stent, CAD, diabetes mellitus type 2, hypertension presents to the ER after patient has benign persistent cough with shortness of breath for the last 2 weeks.  Patient states she gets short of breath on coughing.  Denies any chest pain.  Has been having productive sputum. CXR showed "Bibasilar atelectasis without definitive superimposed acute cardiopulmonary disease on this hypoventilated body habitus degraded examination." Chest CT showed Consolidative changes of the majority of the right lower lobe concerning for postobstructive atelectasis versus pneumonia. Aspiration is not excluded. There is mucus secretion in the right lower lobe bronchus with associated luminal narrowing. Subsegmental left lung base atelectasis versus infiltrate." Pt had MBS on 02/25/18 and again on 03/05/18. On 4/16 Pt had aspiration of thin liquids and penetration of nectar thick liquids (oral control/ premature spill); recommendation for dysphagia 3/ nectar thick. On 4/24 repeat MBS pt showed only flash penetration and advanced to  thin liquids. Upon discharge from inpatient rehab pt was on a regular/ thin liquid diet.     Assessment / Plan / Recommendation Clinical Impression  Pt currently with intermittent cough during clinical evaluation which does not occur immediately after swallowing, unable to determine at bedside whether or not directly correlated with swallow function. However, pt description of symptoms are concerning for possible aspiration. Pt reports frequent coughing during meals PTA, states not being aware of any pattern with certain foods or liquids, states that this has impacted her appetite. Pt with hx of mild oropharyngeal dysphagia with most recent recommendations for dysphagia 3/ thin liquids. During breakfast pt taking small bites/ sips with prolonged mastication, stating "I like to chew it up real good". Pt at increased risk of aspiration given hx and decreased respiratory status. For now recommend continuing current diet but will proceed with MBS to objectively evaluate.  SLP Visit Diagnosis: Dysphagia, oropharyngeal phase (R13.12)    Aspiration Risk  Mild aspiration risk;Moderate aspiration risk    Diet Recommendation Other (Comment)(TBD pending MBS)   Supervision: Patient able to self feed;Intermittent supervision to cue for compensatory strategies Compensations: Slow rate;Small sips/bites Postural Changes: Seated upright at 90 degrees;Remain upright for at least 30 minutes after po intake    Other  Recommendations Oral Care Recommendations: Oral care BID   Follow up Recommendations (TBD)      Frequency and Duration (TBD)          Prognosis        Swallow Study   General HPI: Pt is a 84 y.o. female PMH recent stroke in April 2019, sick sinus syndrome status  post pacemaker placement, renal artery stenosis status post stent, CAD, diabetes mellitus type 2, hypertension presents to the ER after patient has benign persistent cough with shortness of breath for the last 2 weeks.  Patient states  she gets short of breath on coughing.  Denies any chest pain.  Has been having productive sputum. CXR showed "Bibasilar atelectasis without definitive superimposed acute cardiopulmonary disease on this hypoventilated body habitus degraded examination." Chest CT showed Consolidative changes of the majority of the right lower lobe concerning for postobstructive atelectasis versus pneumonia. Aspiration is not excluded. There is mucus secretion in the right lower lobe bronchus with associated luminal narrowing. Subsegmental left lung base atelectasis versus infiltrate." Pt had MBS on 02/25/18 and again on 03/05/18. On 4/16 Pt had aspiration of thin liquids and penetration of nectar thick liquids (oral control/ premature spill); recommendation for dysphagia 3/ nectar thick. On 4/24 repeat MBS pt showed only flash penetration and advanced to thin liquids. Upon discharge from inpatient rehab pt was on a regular/ thin liquid diet.   Type of Study: Bedside Swallow Evaluation Previous Swallow Assessment: See HPI Diet Prior to this Study: Regular;Thin liquids Temperature Spikes Noted: No Respiratory Status: Nasal cannula History of Recent Intubation: No Behavior/Cognition: Alert;Cooperative;Pleasant mood Oral Cavity Assessment: Within Functional Limits Oral Care Completed by SLP: No Oral Cavity - Dentition: Adequate natural dentition Vision: Functional for self-feeding Self-Feeding Abilities: Able to feed self Patient Positioning: Upright in bed Baseline Vocal Quality: Normal Volitional Cough: Strong Volitional Swallow: Able to elicit    Oral/Motor/Sensory Function Overall Oral Motor/Sensory Function: Within functional limits   Ice Chips Ice chips: Not tested   Thin Liquid Thin Liquid: Within functional limits Presentation: Straw    Nectar Thick Nectar Thick Liquid: Not tested   Honey Thick Honey Thick Liquid: Not tested   Puree Puree: Not tested   Solid   GO   Solid: Impaired Presentation: Self  Fed Oral Phase Impairments: Impaired mastication Pharyngeal Phase Impairments: Cough - Delayed        Amy Cecille Aver, MA, CCC-SLP 05/08/2018,9:23 AM  [phone]

## 2018-05-08 NOTE — H&P (Signed)
History and Physical    Charlene Greene GNF:[id number] DOB: 01-12-1922 DOA: 05/07/2018  PCP: Almetta Lovely, [facility]  Patient coming from: Skilled nursing facility.  Chief Complaint: Shortness of breath and cough.  HPI: Charlene Greene is a 84 y.o. female with history of recent stroke in April 2019, sick sinus syndrome status post pacemaker placement, renal artery stenosis status post stent, CAD, diabetes mellitus type 2, hypertension presents to the ER after patient has benign persistent cough with shortness of breath for the last 2 weeks.  Patient states she gets short of breath on coughing.  Denies any chest pain.  Has been having productive sputum.  Patient was given antibiotics and prednisone yesterday at the nursing facility despite which patient was more short of breath and was brought to the ER.  ED Course: In the ER patient is found to be short of breath and BNP done was elevated at 477.  Given the recent stroke and shortness of breath CT angiogram was done which was showing features concerning for patient's right lower lobe differentials include aspiration versus postobstructive.  There was also infiltrates on the left side.  Patient started on antibiotics for pneumonia and admitted for further management.  Review of Systems: As per HPI, rest all negative.   Past Medical History:  Diagnosis Date  . CAD (coronary artery disease)   . Diabetes mellitus without complication (HCC)   . Hyperlipidemia   . Hypertension   . Pacemaker   . Renal artery stenosis (HCC)    s/p bilateral stents  . Sick sinus syndrome (HCC)   . Stroke [facility])    2 previous cva's    Past Surgical History:  Procedure Laterality Date  . PACEMAKER PLACEMENT  05/15/2014   MDT Advisa MRI implanted at [facility] for sick sinus syndrome     reports that she has never smoked. She has never used smokeless tobacco. She reports that she does not drink alcohol or use drugs.  Allergies  Allergen Reactions  . Statins  Other (See Comments)    Intol to numerous statins still feels has some muscle weakness, myalgia  . Other Other (See Comments)    Unknown reaction to dye (listed on [facility] 02/22/18)  . Amlodipine Other (See Comments)    unknown  . Celecoxib Nausea Only and Other (See Comments)  . Codeine Other (See Comments)    unknown  . Naproxen Other (See Comments)    unknown  . Piroxicam Other (See Comments)    unknown    Family History  Problem Relation Age of Onset  . Lung cancer Sister   . Hypertension Other   . CAD Neg Hx   . Stroke Neg Hx     Prior to Admission medications   Medication Sig Start Date End Date Taking? Authorizing Provider  acetaminophen (TYLENOL) 500 MG tablet Take 500 mg by mouth 2 (two) times Greene. Additional Order: Take 500mg  every 12 hours as needed for pain. NTE 2 tablets in 24 hours.   Yes [provider]  amLODipine (NORVASC) 5 MG tablet Take 1 tablet (5 mg total) by mouth 2 (two) times Greene. 03/13/18  Yes Angiulli, Mcarthur Rossetti, PA-C  azithromycin (ZITHROMAX) 250 MG tablet Take 250 mg by mouth at bedtime. For 5 days 05/07/18 05/12/18 Yes [provider]  benzonatate (TESSALON) 100 MG capsule Take 100 mg by mouth Greene. For 10 days 05/07/18 05/17/18 Yes [provider]  cholecalciferol (VITAMIN D) 1000 units tablet Take 1,000 Units by mouth Greene.  Yes [provider]  clopidogrel (PLAVIX) 75 MG tablet Take 1 tablet (75 mg total) by mouth Greene. 03/13/18  Yes Angiulli, Mcarthur Rossetti, PA-C  dextromethorphan-guaiFENesin (MUCINEX DM) 30-600 MG 12hr tablet Take 1 tablet by mouth 2 (two) times Greene. For 4 weeks 04/11/18 05/09/18 Yes [provider]  furosemide (LASIX) 20 MG tablet Take 10 mg by mouth Greene.   Yes [provider]  glimepiride (AMARYL) 1 MG tablet Take 1 tablet (1 mg total) by mouth Greene with breakfast. 03/13/18  Yes Angiulli, Mcarthur Rossetti, PA-C  Ipratropium-Albuterol (COMBIVENT) 20-100 MCG/ACT AERS respimat Inhale 2 puffs into the  lungs 2 (two) times Greene. For 2 weeks 05/06/18 05/20/18 Yes [provider]  isosorbide mononitrate (IMDUR) 60 MG 24 hr tablet Take 1 tablet (60 mg total) by mouth Greene. 10/11/16  Yes Angiulli, Mcarthur Rossetti, PA-C  lisinopril (PRINIVIL,ZESTRIL) 20 MG tablet Take 1 tablet (20 mg total) by mouth 2 (two) times Greene. Patient taking differently: Take 30 mg by mouth Greene.  03/13/18  Yes Angiulli, Mcarthur Rossetti, PA-C  Magnesium 250 MG TABS Take 250 mg by mouth at bedtime.   Yes [provider]  metoprolol succinate (TOPROL-XL) 100 MG 24 hr tablet Take 1 tablet (100 mg total) by mouth Greene. 03/13/18  Yes Angiulli, Mcarthur Rossetti, PA-C  nitroGLYCERIN (NITROSTAT) 0.4 MG SL tablet Place 0.4 mg under the tongue every 5 (five) minutes as needed for chest pain (max 3 doses in 15 minutes).   Yes [provider]  predniSONE (DELTASONE) 5 MG tablet Take 5 mg by mouth once. Additional Orders: Take 10mg  once (05/06/18 @1300 ); Take 15mg  once (05/05/18 @1300 )   Yes [provider]  ranolazine (RANEXA) 500 MG 12 hr tablet Take 1 tablet (500 mg total) by mouth 2 (two) times Greene. 10/11/16  Yes Angiulli, Mcarthur Rossetti, PA-C  senna (SENOKOT) 8.6 MG TABS tablet Take 1 tablet by mouth Greene as needed for mild constipation. NTE 8.6mg  in 24 hours   Yes [provider]  triamterene-hydrochlorothiazide (MAXZIDE-25) 37.5-25 MG tablet Take 1 tablet by mouth Greene. Additional Order: Take 0.5 tablet by mouth on Monday, Wednesday, and Friday. 05/07/18 05/10/18 Yes [provider]  aspirin EC 81 MG EC tablet Take 1 tablet (81 mg total) by mouth Greene. Patient not taking: Reported on 05/07/2018 02/27/18   Edsel Petrin, DO  busPIRone (BUSPAR) 5 MG tablet Take 1 tablet (5 mg total) by mouth 2 (two) times Greene. Patient not taking: Reported on 05/07/2018 03/13/18   Angiulli, Mcarthur Rossetti, PA-C  ezetimibe (ZETIA) 10 MG tablet Take 1 tablet (10 mg total) by mouth Greene. Patient not taking: Reported on 05/07/2018 03/13/18    Angiulli, Mcarthur Rossetti, PA-C  fenofibrate 160 MG tablet Take 1 tablet (160 mg total) by mouth Greene. Patient not taking: Reported on 05/07/2018 10/11/16   Charlton Amor, PA-C    Physical Exam: Vitals:   05/07/18 2215 05/07/18 2300 05/07/18 2357 05/08/18 0005  BP: (!) 165/67 (!) 156/87  (!) 161/90  Pulse: 66 66  72  Resp: 17 19  20   Temp:    (!) 97.5 F (36.4 C)  TempSrc:    Oral  SpO2: 91% 91%  90%  Weight:   74.8 kg (164 lb 14.4 oz) 74.8 kg (164 lb 14.4 oz)  Height:   5' (1.524 m)       Constitutional: Moderately built and nourished. Vitals:   05/07/18 2215 05/07/18 2300 05/07/18 2357 05/08/18 0005  BP: (!) 165/67 (!) 156/87  Marland Kitchen)  161/90  Pulse: 66 66  72  Resp: 17 19  20   Temp:    (!) 97.5 F (36.4 C)  TempSrc:    Oral  SpO2: 91% 91%  90%  Weight:   74.8 kg (164 lb 14.4 oz) 74.8 kg (164 lb 14.4 oz)  Height:   5' (1.524 m)    Eyes: Anicteric no pallor. ENMT: No discharge from the ears eyes nose or mouth. Neck: No mass felt.  No neck rigidity.  No JVD appreciated. Respiratory: No rhonchi or crepitations. Cardiovascular: S1-S2 heard no murmurs appreciated. Abdomen: Soft nontender bowel sounds present. Musculoskeletal: No edema.  No joint effusion. Skin: No rash. Neurologic: Alert awake oriented to time place and person.  Moves all extremities.  Mild weakness in the left lower extremity. Psychiatric: Appears normal.   Labs on Admission: I have personally reviewed following labs and imaging studies  CBC: Recent Labs  Lab 05/07/18 1926  WBC 11.5*  NEUTROABS 9.9*  HGB 13.3  HCT 41.9  MCV 90.3  PLT 286   Basic Metabolic Panel: Recent Labs  Lab 05/07/18 2111  NA 136  K 4.1  CL 97*  CO2 27  GLUCOSE 206*  BUN 19  CREATININE 0.91  CALCIUM 8.4*   GFR: Estimated Creatinine Clearance: 34.9 mL/min (by C-G formula based on SCr of 0.91 mg/dL). Liver Function Tests: No results for input(s): AST, ALT, ALKPHOS, BILITOT, PROT, ALBUMIN in the last 168 hours. No  results for input(s): LIPASE, AMYLASE in the last 168 hours. No results for input(s): AMMONIA in the last 168 hours. Coagulation Profile: No results for input(s): INR, PROTIME in the last 168 hours. Cardiac Enzymes: Recent Labs  Lab 05/07/18 2111  TROPONINI <0.03   BNP (last 3 results) No results for input(s): PROBNP in the last 8760 hours. HbA1C: No results for input(s): HGBA1C in the last 72 hours. CBG: No results for input(s): GLUCAP in the last 168 hours. Lipid Profile: No results for input(s): CHOL, HDL, LDLCALC, TRIG, CHOLHDL, LDLDIRECT in the last 72 hours. Thyroid Function Tests: No results for input(s): TSH, T4TOTAL, FREET4, T3FREE, THYROIDAB in the last 72 hours. Anemia Panel: No results for input(s): VITAMINB12, FOLATE, FERRITIN, TIBC, IRON, RETICCTPCT in the last 72 hours. Urine analysis:    Component Value Date/Time   COLORURINE YELLOW 02/22/2018 2000   APPEARANCEUR CLEAR 02/22/2018 2000   LABSPEC 1.013 02/22/2018 2000   PHURINE 6.0 02/22/2018 2000   GLUCOSEU 50 (A) 02/22/2018 2000   HGBUR NEGATIVE 02/22/2018 2000   BILIRUBINUR NEGATIVE 02/22/2018 2000   KETONESUR NEGATIVE 02/22/2018 2000   PROTEINUR NEGATIVE 02/22/2018 2000   NITRITE NEGATIVE 02/22/2018 2000   LEUKOCYTESUR MODERATE (A) 02/22/2018 2000   Sepsis Labs: @LABRCNTIP (procalcitonin:4,lacticidven:4) )No results found for this or any previous visit (from the past 240 hour(s)).   Radiological Exams on Admission: Dg Chest 2 View  Result Date: 05/07/2018 CLINICAL DATA:  Shortness of breath for the past 2 weeks. Generalized weakness. EXAM: CHEST - 2 VIEW COMPARISON:  02/25/2018; 02/22/2018; 09/28/2016 09/23/2016 FINDINGS: Examination is degraded due to patient body habitus and decreased lung volumes. Grossly unchanged enlarged cardiac silhouette and mediastinal contours with atherosclerotic plaque within the thoracic aorta. Stable position of support apparatus. Veiling opacities overlying the bilateral  lower lungs are favored to represent overlying soft tissues and worsening bibasilar atelectasis. No discrete focal airspace opacities. No pleural effusion or pneumothorax. No definite evidence of edema. No acute osseous abnormalities. Moderate to severe degenerative change of the bilateral glenohumeral joints, incompletely evaluated. IMPRESSION:  Bibasilar atelectasis without definitive superimposed acute cardiopulmonary disease on this hypoventilated body habitus degraded examination. Electronically Signed   By: Simonne ComeJohn  Watts M.D.   On: 05/07/2018 20:04   Ct Angio Chest Pe W/cm &/or Wo Cm  Result Date: 05/08/2018 CLINICAL DATA:  84 year old female with shortness of breath. EXAM: CT ANGIOGRAPHY CHEST WITH CONTRAST TECHNIQUE: Multidetector CT imaging of the chest was performed using the standard protocol during bolus administration of intravenous contrast. Multiplanar CT image reconstructions and MIPs were obtained to evaluate the vascular anatomy. CONTRAST:  51mL ISOVUE-370 IOPAMIDOL (ISOVUE-370) INJECTION 76% COMPARISON:  Chest radiograph dated 05/07/2018 FINDINGS: Cardiovascular: There is mild cardiomegaly. Multi vessel coronary vascular calcification. No pericardial effusion. There is a left pectoral pacemaker device. There is moderate atherosclerotic calcification of thoracic aorta. No aneurysmal dilatation or evidence of dissection. In the there is no CT evidence of pulmonary embolism. Mediastinum/Nodes: There is no hilar or mediastinal adenopathy. There is a small hiatal hernia. The esophagus is grossly unremarkable. No mediastinal fluid collection. Lungs/Pleura: There is a small to moderate right pleural effusion. There is consolidative changes of the region of the of the right lower lobe. Endobronchial fluid collection is noted in the right lower lobe bronchus left lung base subsegmental atelectasis/scarring versus infiltrate. There is no pneumothorax. 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 Upper Abdomen: Atrophic  pancreas. The visualized upper abdomen is otherwise unremarkable. Musculoskeletal: Degenerative changes of the spine. No acute osseous pathology. Rounded ossific densities in the region of the shoulder may represent PVNS, synovial osteochondromatosis, or less likely intra-articular loose bodies. Review of the MIP images confirms the above findings. IMPRESSION: 1. No CT evidence of pulmonary embolism. 2. Consolidative changes of the majority of the right lower lobe concerning for postobstructive atelectasis versus pneumonia. Aspiration is not excluded. There is mucus secretion in the right lower lobe bronchus with associated luminal narrowing. Subsegmental left lung base atelectasis versus infiltrate. 3. Cardiomegaly with coronary vascular calcification. 4.  Aortic Atherosclerosis (ICD10-I70.0). Electronically Signed   By: Elgie CollardArash  Radparvar M.D.   On: 05/08/2018 00:17    EKG: Independently reviewed.  Normal sinus rhythm with nonspecific ST-T changes.  Assessment/Plan Principal Problem:   HCAP (healthcare-associated pneumonia) Active Problems:   Stage 3 chronic kidney disease (HCC)   Type 2 diabetes mellitus with peripheral neuropathy (HCC)   Left thalamic infarction (HCC)   Diabetes mellitus type 2 in nonobese (HCC)   Chronic diastolic congestive heart failure (HCC)   PAD (peripheral artery disease) (HCC)   Acute respiratory failure with hypoxia (HCC)    1. Pneumonia -healthcare associated versus aspiration.  Will keep patient on vancomycin and cefepime for now get sputum cultures.  Get swallow evaluation. 2. Hypertension on Norvasc lisinopril Imdur triamterene Maxide and metoprolol. 3. Diastolic CHF.  2D echo done in April 2019 with EF of 65 to 70% with grade 1 diastolic dysfunction.  BNP is elevated.  Patient on Lasix.  Closely monitor.  Check Greene weights. 4. Diabetes mellitus type 2 on Amaryl.  Will keep patient on sliding scale coverage for now. 5. History of stroke on Plavix Zetia and  fenofibrate.  Allergic to statins. 6. History of sick sinus syndrome status post pacemaker placement. 7. CAD denies any chest pain.  Check troponin.   DVT prophylaxis: Lovenox. Code Status: DNR. Family Communication: Discussed with patient. Disposition Plan: Back to nursing facility. Consults called: Speech therapist. Admission status: Inpatient.   Eduard ClosArshad N Merian Wroe MD [facility] Pager [phone].  If 7PM-7AM, please contact night-coverage [email or web address] Password [email or web address]  05/08/2018,  1:45 AM

## 2018-05-08 NOTE — Progress Notes (Signed)
Patients O2 sat was 88-90% on 2L nasal canula after receiving scheduled breathing txt. Resp increased O2 to 6lL. Patient's O2 sat increased to 91%. MD was paged and placed an ordered for a chest ex-ray and 20mg  of IV lasix. Will continue to monitor.  Elsie Lincolnaven Niki Cosman, RN

## 2018-05-08 NOTE — Plan of Care (Signed)
  Problem: Elimination: Goal: Will not experience complications related to bowel motility Outcome: Completed/Met   Problem: Pain Managment: Goal: General experience of comfort will improve Outcome: Completed/Met   

## 2018-05-08 NOTE — Progress Notes (Signed)
Modified Barium Swallow Progress Note  Patient Details  Name: Charlene Greene MRN: [id number] Date of Birth: [birth year]  Today's Date: 05/08/2018  Modified Barium Swallow completed.  Full report located under Chart Review in the Imaging Section.  Brief recommendations include the following:  Clinical Impression  Pt currently with a mild oropharyngeal and suspect esophageal phase dysphagia. Oropharyngeal phase characterized by occasional tongue pumping and piecemeal swallowing, delay in swallow initiation to the level of the pyriform sinuses and mildly reduced hyolaryngeal excursion; pt had penetration to the level of the vocal folds; penetration was not captured on video but noted later lining of the VFs- this could represent penetration of post-swallow residuals but pt did sense and cough to effectively clear and was an isolated occurrence. Mild residuals noted across consistencies which were cleared with 2nd swallow. Barium tablet became lodged in vallecula and was cleared with follow-up puree bolus. Esophageal sweep revealed stasis of barium material to the mid-upper level of the esophagus. For esophageal efficiency, recommend dysphagia 3 diet/ thin liquids, meds whole in puree, reflux precautions- sit upright 30-60 minutes after meal, small bites/ sips, alternate food with liquid. Pt may benefit from further esophageal assessment. Will continue to follow for diet tolerance/ compensatory strategy training.   Swallow Evaluation Recommendations   Recommended Consults: (GI consult)   SLP Diet Recommendations: Dysphagia 3 (Mech soft) solids;Thin liquid   Liquid Administration via: Cup;Straw   Medication Administration: Whole meds with puree   Supervision: Patient able to self feed;Intermittent supervision to cue for compensatory strategies   Compensations: Slow rate;Small sips/bites;Follow solids with liquid   Postural Changes: Remain semi-upright after after feeds/meals (Comment);Seated  upright at 90 degrees   Oral Care Recommendations: Oral care BID        Metro Kungmy K Romelia Bromell, MA, CCC-SLP 05/08/2018,3:34 PM  [phone]

## 2018-05-09 DIAGNOSIS — N183 Chronic kidney disease, stage 3 (moderate): Secondary | ICD-10-CM

## 2018-05-09 DIAGNOSIS — R0602 Shortness of breath: Secondary | ICD-10-CM

## 2018-05-09 DIAGNOSIS — I5032 Chronic diastolic (congestive) heart failure: Secondary | ICD-10-CM

## 2018-05-09 LAB — LEGIONELLA PNEUMOPHILA SEROGP 1 UR AG: L. PNEUMOPHILA SEROGP 1 UR AG: NEGATIVE

## 2018-05-09 LAB — BASIC METABOLIC PANEL
Anion gap: 11 (ref 5–15)
BUN: 12 mg/dL (ref 8–23)
CALCIUM: 8.6 mg/dL — AB (ref 8.9–10.3)
CO2: 32 mmol/L (ref 22–32)
CREATININE: 0.85 mg/dL (ref 0.44–1.00)
Chloride: 95 mmol/L — ABNORMAL LOW (ref 98–111)
GFR calc non Af Amer: 57 mL/min — ABNORMAL LOW (ref >60)
Glucose, Bld: 144 mg/dL — ABNORMAL HIGH (ref 70–99)
Potassium: 3.9 mmol/L (ref 3.5–5.1)
SODIUM: 138 mmol/L (ref 135–145)

## 2018-05-09 LAB — GLUCOSE, CAPILLARY
GLUCOSE-CAPILLARY: 193 mg/dL — AB (ref 70–99)
GLUCOSE-CAPILLARY: 161 mg/dL — AB (ref 70–99)
GLUCOSE-CAPILLARY: 184 mg/dL — AB (ref 70–99)
Glucose-Capillary: 103 mg/dL — ABNORMAL HIGH (ref 70–99)

## 2018-05-09 MED ORDER — ADULT MULTIVITAMIN W/MINERALS CH
1.0000 | ORAL_TABLET | Freq: Every day | ORAL | Status: DC
  Administered 2018-05-09 – 2018-05-13 (×5): 1 via ORAL
  Filled 2018-05-09 (×4): qty 1

## 2018-05-09 MED ORDER — FUROSEMIDE 10 MG/ML IJ SOLN
20.0000 mg | Freq: Once | INTRAMUSCULAR | Status: AC
  Administered 2018-05-09: 20 mg via INTRAVENOUS
  Filled 2018-05-09: qty 2

## 2018-05-09 MED ORDER — ENSURE ENLIVE PO LIQD
237.0000 mL | Freq: Two times a day (BID) | ORAL | Status: DC
  Administered 2018-05-09 – 2018-05-13 (×7): 237 mL via ORAL

## 2018-05-09 MED ORDER — SODIUM CHLORIDE 0.9 % IV SOLN
INTRAVENOUS | Status: DC | PRN
  Administered 2018-05-09 – 2018-05-10 (×2): 250 mL via INTRAVENOUS
  Administered 2018-05-10: 10 mL via INTRAVENOUS

## 2018-05-09 MED ORDER — FUROSEMIDE 20 MG PO TABS
10.0000 mg | ORAL_TABLET | Freq: Every day | ORAL | Status: DC
  Administered 2018-05-10 – 2018-05-12 (×3): 10 mg via ORAL
  Filled 2018-05-09 (×3): qty 1

## 2018-05-09 NOTE — Clinical Social Work Note (Signed)
Clinical Social Work Assessment  Patient Details  Name: Charlene Greene MRN: [id number] Date of Birth: 05-05-1922  Date of referral:  05/09/18               Reason for consult:  Discharge Planning                Permission sought to share information with:  Chartered certified accountant granted to share information::  Yes, Verbal Permission Granted  Name::        Agency::  [facility]  Relationship::     Contact Information:     Housing/Transportation Living arrangements for the past 2 months:  [place] of Information:  Patient, Medical Team, Facility Patient Interpreter Needed:  None Criminal Activity/Legal Involvement Pertinent to Current Situation/Hospitalization:  No - Comment as needed Significant Relationships:  Adult Children Lives with:  Facility Resident Do you feel safe going back to the place where you live?  Yes Need for family participation in patient care:  Yes (Comment)  Care giving concerns:  Patient is a resident at [facility].   Social Worker assessment / plan:  CSW met with patient. No supports at bedside. CSW introduced role and explained that discharge planning would be discussed. Patient confirmed she is a resident at [facility] and plans to return at discharge. No further concerns. CSW encouraged patient to contact CSW as needed. CSW will continue to follow patient for support and facilitate discharge back to ALF once medically stable.  Employment status:  Retired Forensic scientist:  Medicare PT Recommendations:  Not assessed at this time [place] / Referral to community resources:  Other (Comment Required)(Plan to return to ALF.)  Patient/Family's Response to care:  Patient agreeable to return to ALF. Patient's children supportive and involved in patient's care. Patient appreciated social work intervention.  Patient/Family's Understanding of and Emotional Response to Diagnosis, Current  Treatment, and Prognosis:  Patient has a good understanding of the reason for admission and plan to return to ALF at discharge. Patient appears happy with hospital care.  Emotional Assessment Appearance:  Appears stated age Attitude/Demeanor/Rapport:  Engaged, Gracious Affect (typically observed):  Accepting, Appropriate, Calm, Pleasant Orientation:  Oriented to Self, Oriented to Place, Oriented to  Time, Oriented to Situation Alcohol / Substance use:  Never Used Psych involvement (Current and /or in the community):  No (Comment)  Discharge Needs  Concerns to be addressed:  Care Coordination Readmission within the last 30 days:  No Current discharge risk:  None Barriers to Discharge:  Continued Medical Work up   Candie Chroman, LCSW 05/09/2018, 10:21 AM

## 2018-05-09 NOTE — Progress Notes (Signed)
Initial Nutrition Assessment  DOCUMENTATION CODES:   Obesity unspecified  INTERVENTION:   -Magic Cup TID with meals, each supplement provides 290 kcals and 9 grams protein -Ensure Enlive po BID, each supplement provides 350 kcal and 20 grams of protein -MVI with minerals daily  NUTRITION DIAGNOSIS:   Inadequate oral intake related to decreased appetite as evidenced by meal completion < 50%.  GOAL:   Patient will meet greater than or equal to 90% of their needs  MONITOR:   PO intake, Supplement acceptance, Labs, Weight trends, Skin, I & O's  REASON FOR ASSESSMENT:   Rounds    ASSESSMENT:   Charlene Greene is a 84 y.o. female with history of recent stroke in April 2019, sick sinus syndrome status post pacemaker placement, renal artery stenosis status post stent, CAD, diabetes mellitus type 2, hypertension presents to the ER after patient has benign persistent cough with shortness of breath for the last 2 weeks  Pt admitted with HCAP.   RD assessed pt per request of RN, due to very poor oral intake. Per discussion with RN, pt is consuming mainly Ensure supplements that RN has provided her today, however, RN is requesting this RD evaluate for appropriate supplement and interventions.   Noted pt underwent MBSS on 05/08/18, which recommended dysphagia 3 diet with thin liquids.  Spoke with pt and family members at bedside. All reports a general decline in appetite over the past 2 weeks. Per pt daughter, pt usually has an extremely good appetite and eats a variety of foods. Pt has had some episodes with dysphagia and pneumonia in the past, however, pt usually recovers quickly. She enjoys her food, especially when in the hospital. However, pt has been consuming very little (noted pt only orders some soup this afternoon). Per family, this is concerning and very different from her baseline. They desire that pt is on the least restrictive diet possible for optimizing intake. Pt daughter also  voiced concern over pt's minimal fluid intake.   Pt denies any weight loss.   Reviewed menu and supplement options with pt and family. They are requesting continuing Ensure supplements.   Pt with poor oral intake and would benefit from nutrient dense supplement. One Ensure Enlive supplement provides 350 kcals, 20 grams protein, and 44-45 grams of carbohydrate vs one Glucerna shake supplement, which provides 220 kcals, 10 grams of protein, and 26 grams of carbohydrate. Given pt's hx of DM, RD will continue to monitor PO intake, CBGS, and adjust supplement regimen as appropriate.   Last Hgb A1c: 6.9 (02/24/18). PTA DM medications 1mg  amaryl q AM.  Labs reviewed: CBGS: 103-193 (inpatient orders for glycemic control are 0-9 units insulin aspart TID with meals).  NUTRITION - FOCUSED PHYSICAL EXAM:    Most Recent Value  Orbital Region  No depletion  Upper Arm Region  Mild depletion  Thoracic and Lumbar Region  No depletion  Buccal Region  No depletion  Temple Region  No depletion  Clavicle Bone Region  No depletion  Clavicle and Acromion Bone Region  No depletion  Scapular Bone Region  No depletion  Dorsal Hand  No depletion  Patellar Region  No depletion  Anterior Thigh Region  No depletion  Posterior Calf Region  Mild depletion  Edema (RD Assessment)  Mild  Hair  Reviewed  Eyes  Reviewed  Mouth  Reviewed  Skin  Reviewed  Nails  Reviewed       Diet Order:   Diet Order  Diet heart healthy/carb modified Room service appropriate? Yes; Fluid consistency: Thin; Fluid restriction: 1200 mL Fluid  Diet effective now          EDUCATION NEEDS:   Not appropriate for education at this time  Skin:  Skin Assessment: Skin Integrity Issues: Skin Integrity Issues:: Stage I Stage I: buttocks  Last BM:  05/07/18  Height:   Ht Readings from Last 1 Encounters:  05/07/18 5' (1.524 m)    Weight:   Wt Readings from Last 1 Encounters:  05/09/18 162 lb (73.5 kg)    Ideal  Body Weight:  45.5 kg  BMI:  Body mass index is 31.64 kg/m.  Estimated Nutritional Needs:   Kcal:  1600-1800  Protein:  75-90 grams  Fluid:  per MD    Charlene Greene, RD, LDN, CDE Pager: [phone] After hours Pager: [phone]

## 2018-05-09 NOTE — Plan of Care (Signed)
  Problem: Education: Goal: Knowledge of General Education information will improve Outcome: Progressing   Problem: Health Behavior/Discharge Planning: Goal: Ability to manage health-related needs will improve Outcome: Progressing   Problem: Clinical Measurements: Goal: Ability to maintain clinical measurements within normal limits will improve Outcome: Not Progressing Note:  Worsien symptoms of SOB an congestion

## 2018-05-09 NOTE — Progress Notes (Signed)
  Speech Language Pathology Treatment: Dysphagia  Patient Details Name: Charlene Greene MRN: [id number] DOB: [birth year] Today's Date: 05/09/2018 Time: [id number] SLP Time Calculation (min) (ACUTE ONLY): 17 min  Assessment / Plan / Recommendation Clinical Impression  Pt exhibited delayed cough intermittently during PO intake of Dysphagia 3/thin liquids utilizing small bites/sips and liquid wash prn with minimal verbal cueing for swallowing strategies required; pt able to reiterate swallow precautions with min verbal cues from SLP; pt stated "I cough even when not eating"; reviewed MBS results and need for swallow precautions with pt; pt in agreement; discussed possibility of GI consult for esophageal symptoms noted on MBS on 05/08/18 and pt denies any esophageal issues in past or present. Continue current diet of Dysphagia 3/thin liquids with slow rate, small bites/sips and esophageal precautions in place during intake; ST will f/u while in acute setting for diet tolerance/education re: dysphagia precautions.  HPI HPI: Pt is a 84 y.o. female PMH recent stroke in April 2019, sick sinus syndrome status post pacemaker placement, renal artery stenosis status post stent, CAD, diabetes mellitus type 2, hypertension presents to the ER after patient has benign persistent cough with shortness of breath for the last 2 weeks.  Patient states she gets short of breath on coughing.  Denies any chest pain.  Has been having productive sputum. CXR showed "Bibasilar atelectasis without definitive superimposed acute cardiopulmonary disease on this hypoventilated body habitus degraded examination." Chest CT showed Consolidative changes of the majority of the right lower lobe concerning for postobstructive atelectasis versus pneumonia. Aspiration is not excluded. There is mucus secretion in the right lower lobe bronchus with associated luminal narrowing. Subsegmental left lung base atelectasis versus infiltrate." Pt had MBS on  02/25/18 and again on 03/05/18. On 4/16 Pt had aspiration of thin liquids and penetration of nectar thick liquids (oral control/ premature spill); recommendation for dysphagia 3/ nectar thick. On 4/24 repeat MBS pt showed only flash penetration and advanced to thin liquids. Upon discharge from inpatient rehab pt was on a regular/ thin liquid diet; MBS repeated on 05/08/18 indicating deep penetration (with coating of VF 1x) and clearance of aspirate with cough initiated by pt; diet recommendation for Dysphagia 3/thin liquids with swallowing precautions and potential for esophageal dysphagia noted/GI consult considered; CXR on 05/08/18 indicated Mild cardiomegaly. Increased hazy right parahilar/basilar lung opacity, favor asymmetric mild pulmonary edema. 2. Small right pleural effusion, slightly increased. Stable trace left pleural effusion      SLP Plan  Continue with current plan of care       Recommendations  Diet recommendations: Dysphagia 3 (mechanical soft);Thin liquid Liquids provided via: Cup;Straw Medication Administration: Whole meds with puree Supervision: Patient able to self feed Compensations: Slow rate;Small sips/bites;Follow solids with liquid Postural Changes and/or Swallow Maneuvers: Seated upright 90 degrees;Upright 30-60 min after meal                Oral Care Recommendations: Oral care BID Follow up Recommendations: Other (comment) SLP Visit Diagnosis: Dysphagia, oropharyngeal phase (R13.12) Plan: Continue with current plan of care                       Charlene StalkerPat Kalon Greene, M.S., CCC-SLP 05/09/2018, 9:32 AM

## 2018-05-09 NOTE — Progress Notes (Signed)
Pt was call to room for resp. distress upon arrival baseline had not changed  Upon reassessment patient is in no distress experiencing acute retention with void of . 02 sats 96 on 5l. MD aware and will reasses.,

## 2018-05-09 NOTE — Significant Event (Signed)
Rapid Response Event Note  Overview: Time Called: 1145 Arrival Time: 1145 Event Type: Respiratory  Initial Focused Assessment: Respiratory therapist called for staff, states patient sats dropping, currently receiving respiratory treatment. Patient assessed by nursing staff. Patient does not appear to be in respiratory distress at this time, though O2 sats noted to be in upper 80's on 6L via nasal cannula. Patient received IV lasix as ordered this morning, but no urine output noted in cannister. Old urine output noted on patient's bedpad.  Interventions: New external catheter placed and position verified.  Repositioned patient to high fowler's.  Chest PT performed by nursing staff  Flutter valve exercises done by patient.  Patient encouraged to cough and breathe deeply.  Bladder scan done, retention noted. Patient encouraged to urinate, no past retention noted.   Plan of Care (if not transferred): Update MD as needed Call RRT as needed.  Event Summary:  Outcome: Stayed in room and stabalized  Event End Time: 1215  Charlene Greene

## 2018-05-09 NOTE — Progress Notes (Signed)
PROGRESS NOTE  Angeline Trick UEA:[id number] DOB: [birth year] DOA: 05/07/2018 PCP: Almetta Lovely, [facility]  HPI  Ori Kreiter is a 84 y.o. year old female with medical history significant for CVA (02/2018), sick sinus syndrome status post pacemaker placement, renal artery stenosis status post stent, CAD, diabetes mellitus type 2, hypertension who presented on 05/07/2018 with worsening nonproductive cough shortness of breath for 2 weeks and was found to have acute on chronic hypoxic respiratory failure presumed secondary to right lower lobe pneumonia of unclear etiology.  Interval History  Overnight increased in O2 needs to 5 L. Given Iv lasix 20 mg x1 based on CXR with mild pulmonary edema  ROS:    Subjective Feels somewhat better  Assessment/Plan: Principal Problem:   HCAP (healthcare-associated pneumonia) Active Problems:   Stage 3 chronic kidney disease (HCC)   Type 2 diabetes mellitus with peripheral neuropathy (HCC)   Left thalamic infarction (HCC)   Diabetes mellitus type 2 in nonobese (HCC)   Chronic diastolic congestive heart failure (HCC)   PAD (peripheral artery disease) (HCC)   Acute respiratory failure with hypoxia (HCC)  Acute on chronic hypoxic respiratory failure, likely multifactorial etiology.  Admission CTA chest negative for PE but concerning for RLL consolidation possibly representing postobstructive atelectasis or PNA. Empiric vancomycin and cefepime while awaiting culture data.  Started IV lasix given pulmonary edema on recent CXR.   strep pneumo negative, pending Legionella.  Supportive care with flutter valve, O2, mobilization of secretions/bed to chair duo nebs QID.  If no improvement in respiratory status may need to consider pulmonary consultation.  Hypertension, stable.  Continue home Norvasc, lisinopril, Imdur, Maxzide and Toprol.  Acute on Chronic Diastolic CHF exacerbation  BNP slightly elevated greater than 400.  Last TTE 02/2017 with preserved EF  and grade 1 diastolic dysfunction. CXR shows mild pulmonary edema. Will give IV lasix 20 mg this am. Strict I/os, daily weights.  Continue low-dose home Lasix on tomorrow if continues to respond.  Type 2 diabetes.  A1c 7.  Holding home Amaryl.  Sliding scale coverage for now.  CVA on 02/2018.  Continue Plavix, Zetia, fenofibrate.  Sick sinus syndrome status post his pacemaker placement.  Monitor telemetry  CAD.  Stable.  Denies any chest pain. Troponin negative. Imdur, ranexa   Code Status:DNR  Family Communication: No family at bedside  Disposition Plan: IV antibiotics, IV lasix culture data, improvement in respiratory status   Consultants:  none  Procedures:  none   Antimicrobials: Anti-infectives (From admission, onward)   Start     Dose/Rate Route Frequency Ordered Stop   05/10/18 0200  vancomycin (VANCOCIN) IVPB 1000 mg/200 mL premix     1,000 mg 200 mL/hr over 60 Minutes Intravenous Every 48 hours 05/08/18 0152     05/08/18 0600  ceFEPIme (MAXIPIME) 1 g in sodium chloride 0.9 % 100 mL IVPB  Status:  Discontinued     1 g 200 mL/hr over 30 Minutes Intravenous Every 8 hours 05/08/18 0145 05/08/18 0153   05/08/18 0200  vancomycin (VANCOCIN) 1,500 mg in sodium chloride 0.9 % 500 mL IVPB     1,500 mg 250 mL/hr over 120 Minutes Intravenous  Once 05/08/18 0150 05/08/18 0453   05/08/18 0200  ceFEPIme (MAXIPIME) 1 g in sodium chloride 0.9 % 100 mL IVPB     1 g 200 mL/hr over 30 Minutes Intravenous Every 24 hours 05/08/18 0153 05/16/18 0159        Cultures:  Strep pneumo negative  Legionella pending  Sputum culture-pending (  not collected, nonproductive cough)  Telemetry: YES  DVT prophylaxis: Lovenox   Objective: Vitals:   05/08/18 1217 05/08/18 2044 05/08/18 2057 05/09/18 0408  BP: (!) 146/78  (!) 152/83 (!) 160/72  Pulse: 64  64 60  Resp: 18  20 20   Temp: 97.6 F (36.4 C)  98.3 F (36.8 C) 98.8 F (37.1 C)  TempSrc: Oral  Oral Oral  SpO2:  93% 93%  92%  Weight:    73.5 kg (162 lb)  Height:        Intake/Output Summary (Last 24 hours) at 05/09/2018 0829 Last data filed at 05/09/2018 0500 Gross per 24 hour  Intake 326.69 ml  Output 1600 ml  Net -1273.31 ml   Filed Weights   05/07/18 2357 05/08/18 0005 05/09/18 0408  Weight: 74.8 kg (164 lb 14.4 oz) 74.8 kg (164 lb 14.4 oz) 73.5 kg (162 lb)    Exam:  Constitutional:normal appearing female, in no distress Eyes: EOMI, anicteric, normal conjunctivae ENMT: Oropharynx with moist mucous membranes, normal dentition Cardiovascular: RRR no MRGs, with no peripheral edema Respiratory: Normal respiratory effort on 5 L [place], diminished breath sounds throughout with slight crackles at right base  Abdomen: Soft,non-tender,  Skin: No rash ulcers, or lesions. Without skin tenting  Neurologic: Grossly no focal neuro deficit. Psychiatric:Appropriate affect, and mood. Mental status AAOx3  Data Reviewed: CBC: Recent Labs  Lab 05/07/18 1926 05/08/18 0634  WBC 11.5* 10.1  NEUTROABS 9.9*  --   HGB 13.3 12.4  HCT 41.9 38.4  MCV 90.3 89.1  PLT 286 280   Basic Metabolic Panel: Recent Labs  Lab 05/07/18 2111 05/08/18 0634  NA 136 139  K 4.1 3.8  CL 97* 100  CO2 27 30  GLUCOSE 206* 130*  BUN 19 13  CREATININE 0.91 0.74  CALCIUM 8.4* 8.5*   GFR: Estimated Creatinine Clearance: 39.3 mL/min (by C-G formula based on SCr of 0.74 mg/dL). Liver Function Tests: No results for input(s): AST, ALT, ALKPHOS, BILITOT, PROT, ALBUMIN in the last 168 hours. No results for input(s): LIPASE, AMYLASE in the last 168 hours. No results for input(s): AMMONIA in the last 168 hours. Coagulation Profile: No results for input(s): INR, PROTIME in the last 168 hours. Cardiac Enzymes: Recent Labs  Lab 05/07/18 2111 05/08/18 0634  TROPONINI <0.03 <0.03   BNP (last 3 results) No results for input(s): PROBNP in the last 8760 hours. HbA1C: No results for input(s): HGBA1C in the last 72  hours. CBG: Recent Labs  Lab 05/08/18 0754 05/08/18 1222 05/08/18 1637 05/08/18 2124 05/09/18 0741  GLUCAP 129* 137* 135* 131* 103*   Lipid Profile: No results for input(s): CHOL, HDL, LDLCALC, TRIG, CHOLHDL, LDLDIRECT in the last 72 hours. Thyroid Function Tests: No results for input(s): TSH, T4TOTAL, FREET4, T3FREE, THYROIDAB in the last 72 hours. Anemia Panel: No results for input(s): VITAMINB12, FOLATE, FERRITIN, TIBC, IRON, RETICCTPCT in the last 72 hours. Urine analysis:    Component Value Date/Time   COLORURINE YELLOW 02/22/2018 2000   APPEARANCEUR CLEAR 02/22/2018 2000   LABSPEC 1.013 02/22/2018 2000   PHURINE 6.0 02/22/2018 2000   GLUCOSEU 50 (A) 02/22/2018 2000   HGBUR NEGATIVE 02/22/2018 2000   BILIRUBINUR NEGATIVE 02/22/2018 2000   KETONESUR NEGATIVE 02/22/2018 2000   PROTEINUR NEGATIVE 02/22/2018 2000   NITRITE NEGATIVE 02/22/2018 2000   LEUKOCYTESUR MODERATE (A) 02/22/2018 2000   Sepsis Labs: @LABRCNTIP (procalcitonin:4,lacticidven:4)  ) Recent Results (from the past 240 hour(s))  MRSA PCR Screening     Status: None  Collection Time: 05/08/18  3:23 AM  Result Value Ref Range Status   MRSA by PCR NEGATIVE NEGATIVE Final    Comment:        The GeneXpert MRSA Assay (FDA approved for NASAL specimens only), is one component of a comprehensive MRSA colonization surveillance program. It is not intended to diagnose MRSA infection nor to guide or monitor treatment for MRSA infections. Performed at [facility], [address], [place], [place] [address]       Studies: Dg Chest Port 1 View  Result Date: 05/08/2018 CLINICAL DATA:  Dyspnea EXAM: PORTABLE CHEST 1 VIEW COMPARISON:  05/07/2018 chest radiograph. FINDINGS: Stable 2 lead left subclavian pacemaker. Stable cardiomediastinal silhouette with mild cardiomegaly. No pneumothorax. Small right pleural effusion, slightly increased. Stable trace left pleural effusion. Increased hazy right parahilar  and right basilar lung opacity. Stable mild left basilar atelectasis. IMPRESSION: 1. Mild cardiomegaly. Increased hazy right parahilar/basilar lung opacity, favor asymmetric mild pulmonary edema. 2. Small right pleural effusion, slightly increased. Stable trace left pleural effusion. Electronically Signed   By: Delbert Phenix M.D.   On: 05/08/2018 22:01   Dg Swallowing Func-speech Pathology  Result Date: 05/08/2018 Objective Swallowing Evaluation: Type of Study: MBS-Modified Barium Swallow Study  Patient Details Name: Sholonda Jobst MRN: [id number] Date of Birth: 1921/12/03 Today's Date: 05/08/2018 Time: SLP Start Time (ACUTE ONLY): 1300 -SLP Stop Time (ACUTE ONLY): 1325 SLP Time Calculation (min) (ACUTE ONLY): 25 min Past Medical History: Past Medical History: Diagnosis Date . CAD (coronary artery disease)  . Diabetes mellitus without complication (HCC)  . Hyperlipidemia  . Hypertension  . Pacemaker  . Renal artery stenosis (HCC)   s/p bilateral stents . Sick sinus syndrome (HCC)  . Stroke [facility])   2 previous cva's Past Surgical History: Past Surgical History: Procedure Laterality Date . PACEMAKER PLACEMENT  05/15/2014  MDT Advisa MRI implanted at [facility] for sick sinus syndrome HPI: Pt is a 84 y.o. female PMH recent stroke in April 2019, sick sinus syndrome status post pacemaker placement, renal artery stenosis status post stent, CAD, diabetes mellitus type 2, hypertension presents to the ER after patient has benign persistent cough with shortness of breath for the last 2 weeks.  Patient states she gets short of breath on coughing.  Denies any chest pain.  Has been having productive sputum. CXR showed "Bibasilar atelectasis without definitive superimposed acute cardiopulmonary disease on this hypoventilated body habitus degraded examination." Chest CT showed Consolidative changes of the majority of the right lower lobe concerning for postobstructive atelectasis versus pneumonia. Aspiration is not excluded. There is  mucus secretion in the right lower lobe bronchus with associated luminal narrowing. Subsegmental left lung base atelectasis versus infiltrate." Pt had MBS on 02/25/18 and again on 03/05/18. On 4/16 Pt had aspiration of thin liquids and penetration of nectar thick liquids (oral control/ premature spill); recommendation for dysphagia 3/ nectar thick. On 4/24 repeat MBS pt showed only flash penetration and advanced to thin liquids. Upon discharge from inpatient rehab pt was on a regular/ thin liquid diet.   Subjective: pt pleasant, cooperative Assessment / Plan / Recommendation CHL IP CLINICAL IMPRESSIONS 05/08/2018 Clinical Impression Pt currently with a mild oropharyngeal and suspect esophageal phase dysphagia. Oropharyngeal phase characterized by occasional tongue pumping and piecemeal swallowing, delay in swallow initiation to the level of the pyriform sinuses and mildly reduced hyolaryngeal excursion; pt had penetration to the level of the vocal folds; penetration was not captured on video but noted later lining of the VFs-  this could represent penetration of post-swallow residuals but pt did sense and cough to effectively clear and was an isolated occurrence. Mild residuals noted across consistencies which were cleared with 2nd swallow. Barium tablet became lodged in vallecula and was cleared with follow-up puree bolus. Esophageal sweep revealed stasis of barium material to the mid-upper level of the esophagus. For esophageal efficiency, recommend dysphagia 3 diet/ thin liquids, meds whole in puree, reflux precautions- sit upright 30-60 minutes after meal, small bites/ sips, alternate food with liquid. Pt may benefit from further esophageal assessment. Will continue to follow for diet tolerance/ compensatory strategy training. SLP Visit Diagnosis Dysphagia, oropharyngeal phase (R13.12) Attention and concentration deficit following -- Frontal lobe and executive function deficit following -- Impact on safety and  function Mild aspiration risk   CHL IP TREATMENT RECOMMENDATION 05/08/2018 Treatment Recommendations Therapy as outlined in treatment plan below   Prognosis 05/08/2018 Prognosis for Safe Diet Advancement Fair Barriers to Reach Goals (No Data) Barriers/Prognosis Comment -- CHL IP DIET RECOMMENDATION 05/08/2018 SLP Diet Recommendations Dysphagia 3 (Mech soft) solids;Thin liquid Liquid Administration via Cup;Straw Medication Administration Whole meds with puree Compensations Slow rate;Small sips/bites;Follow solids with liquid Postural Changes Remain semi-upright after after feeds/meals (Comment);Seated upright at 90 degrees   CHL IP OTHER RECOMMENDATIONS 05/08/2018 Recommended Consults (No Data) Oral Care Recommendations Oral care BID Other Recommendations --   CHL IP FOLLOW UP RECOMMENDATIONS 05/08/2018 Follow up Recommendations Other (comment)   CHL IP FREQUENCY AND DURATION 05/08/2018 Speech Therapy Frequency (ACUTE ONLY) min 2x/week Treatment Duration 1 week      CHL IP ORAL PHASE 05/08/2018 Oral Phase Impaired Oral - Pudding Teaspoon -- Oral - Pudding Cup -- Oral - Honey Teaspoon -- Oral - Honey Cup -- Oral - Nectar Teaspoon -- Oral - Nectar Cup -- Oral - Nectar Straw -- Oral - Thin Teaspoon -- Oral - Thin Cup Lingual/palatal residue;Piecemeal swallowing;Premature spillage Oral - Thin Straw Piecemeal swallowing;Premature spillage Oral - Puree Lingual pumping;Piecemeal swallowing Oral - Mech Soft -- Oral - Regular Lingual pumping;Piecemeal swallowing Oral - Multi-Consistency -- Oral - Pill WFL Oral Phase - Comment --  CHL IP PHARYNGEAL PHASE 05/08/2018 Pharyngeal Phase Impaired Pharyngeal- Pudding Teaspoon -- Pharyngeal -- Pharyngeal- Pudding Cup -- Pharyngeal -- Pharyngeal- Honey Teaspoon -- Pharyngeal -- Pharyngeal- Honey Cup -- Pharyngeal -- Pharyngeal- Nectar Teaspoon -- Pharyngeal -- Pharyngeal- Nectar Cup -- Pharyngeal -- Pharyngeal- Nectar Straw -- Pharyngeal -- Pharyngeal- Thin Teaspoon -- Pharyngeal --  Pharyngeal- Thin Cup Delayed swallow initiation-pyriform sinuses;Penetration/Apiration after swallow;Pharyngeal residue - valleculae Pharyngeal Material enters airway, CONTACTS cords and then ejected out Pharyngeal- Thin Straw Delayed swallow initiation-pyriform sinuses;Penetration/Aspiration before swallow Pharyngeal Material enters airway, remains ABOVE vocal cords then ejected out Pharyngeal- Puree Pharyngeal residue - valleculae Pharyngeal -- Pharyngeal- Mechanical Soft -- Pharyngeal -- Pharyngeal- Regular Pharyngeal residue - valleculae Pharyngeal -- Pharyngeal- Multi-consistency -- Pharyngeal -- Pharyngeal- Pill Pharyngeal residue - valleculae Pharyngeal -- Pharyngeal Comment --  CHL IP CERVICAL ESOPHAGEAL PHASE 05/08/2018 Cervical Esophageal Phase Impaired Pudding Teaspoon -- Pudding Cup -- Honey Teaspoon -- Honey Cup -- Nectar Teaspoon -- Nectar Cup -- Nectar Straw -- Thin Teaspoon -- Thin Cup -- Thin Straw -- Puree -- Mechanical Soft -- Regular -- Multi-consistency -- Pill -- Cervical Esophageal Comment (No Data) No flowsheet data found. Amy Cecille AverK Oleksiak, MA, CCC-SLP 05/08/2018, 3:33 PM [phone]               Scheduled Meds: . amLODipine  5 mg Oral BID  . clopidogrel  75 mg Oral  Daily  . enoxaparin (LOVENOX) injection  40 mg Subcutaneous Q24H  . furosemide  20 mg Intravenous Once  . [START ON 05/10/2018] furosemide  10 mg Oral Daily  . insulin aspart  0-9 Units Subcutaneous TID WC  . ipratropium-albuterol  3 mL Inhalation QID  . isosorbide mononitrate  60 mg Oral Daily  . lisinopril  30 mg Oral Daily  . magnesium oxide  400 mg Oral QHS  . metoprolol succinate  100 mg Oral Daily  . ranolazine  500 mg Oral BID  . triamterene-hydrochlorothiazide  1 tablet Oral Daily    Continuous Infusions: . sodium chloride 10 mL/hr at 05/09/18 0500  . ceFEPime (MAXIPIME) IV 200 mL/hr at 05/09/18 0428  . [START ON 05/10/2018] vancomycin       LOS: 2 days     Laverna Peace, MD [facility] Pager  [phone]  If 7PM-7AM, please contact night-coverage [email or web address] Password [facility] 05/09/2018, 8:29 AM

## 2018-05-09 NOTE — Progress Notes (Signed)
Pt is alert and oriented congested coughing with med's and refused apple sauce or crushing. Gave teaching on sip and chin tuck.

## 2018-05-10 DIAGNOSIS — J181 Lobar pneumonia, unspecified organism: Principal | ICD-10-CM

## 2018-05-10 DIAGNOSIS — E1142 Type 2 diabetes mellitus with diabetic polyneuropathy: Secondary | ICD-10-CM

## 2018-05-10 DIAGNOSIS — J189 Pneumonia, unspecified organism: Secondary | ICD-10-CM

## 2018-05-10 LAB — BASIC METABOLIC PANEL
Anion gap: 8 (ref 5–15)
BUN: 19 mg/dL (ref 8–23)
CALCIUM: 8.2 mg/dL — AB (ref 8.9–10.3)
CO2: 32 mmol/L (ref 22–32)
Chloride: 93 mmol/L — ABNORMAL LOW (ref 98–111)
Creatinine, Ser: 0.87 mg/dL (ref 0.44–1.00)
GFR calc Af Amer: >60 mL/min (ref >60)
GFR, EST NON AFRICAN AMERICAN: 56 mL/min — AB (ref >60)
GLUCOSE: 189 mg/dL — AB (ref 70–99)
Potassium: 3.9 mmol/L (ref 3.5–5.1)
Sodium: 133 mmol/L — ABNORMAL LOW (ref 135–145)

## 2018-05-10 LAB — CBC
HEMATOCRIT: 40.5 % (ref 36.0–46.0)
Hemoglobin: 13 g/dL (ref 12.0–15.0)
MCH: 28.2 pg (ref 26.0–34.0)
MCHC: 32.1 g/dL (ref 30.0–36.0)
MCV: 87.9 fL (ref 78.0–100.0)
Platelets: 290 10*3/uL (ref 150–400)
RBC: 4.61 MIL/uL (ref 3.87–5.11)
RDW: 13.2 % (ref 11.5–15.5)
WBC: 8 10*3/uL (ref 4.0–10.5)

## 2018-05-10 LAB — BRAIN NATRIURETIC PEPTIDE: B Natriuretic Peptide: 264 pg/mL — ABNORMAL HIGH (ref 0.0–100.0)

## 2018-05-10 LAB — GLUCOSE, CAPILLARY
GLUCOSE-CAPILLARY: 148 mg/dL — AB (ref 70–99)
Glucose-Capillary: 151 mg/dL — ABNORMAL HIGH (ref 70–99)
Glucose-Capillary: 287 mg/dL — ABNORMAL HIGH (ref 70–99)
Glucose-Capillary: 163 mg/dL — ABNORMAL HIGH (ref 70–99)

## 2018-05-10 MED ORDER — IPRATROPIUM-ALBUTEROL 0.5-2.5 (3) MG/3ML IN SOLN
3.0000 mL | Freq: Three times a day (TID) | RESPIRATORY_TRACT | Status: DC
Start: 2018-05-10 — End: 2018-05-12
  Administered 2018-05-10 – 2018-05-12 (×6): 3 mL via RESPIRATORY_TRACT
  Filled 2018-05-10 (×6): qty 3

## 2018-05-10 MED ORDER — FUROSEMIDE 10 MG/ML IJ SOLN
40.0000 mg | Freq: Once | INTRAMUSCULAR | Status: AC
  Administered 2018-05-10: 40 mg via INTRAVENOUS
  Filled 2018-05-10: qty 4

## 2018-05-10 MED ORDER — BENZONATATE 100 MG PO CAPS
200.0000 mg | ORAL_CAPSULE | Freq: Three times a day (TID) | ORAL | Status: DC | PRN
  Administered 2018-05-10 – 2018-05-14 (×5): 200 mg via ORAL
  Filled 2018-05-10 (×5): qty 2

## 2018-05-10 MED ORDER — DM-GUAIFENESIN ER 30-600 MG PO TB12
1.0000 | ORAL_TABLET | Freq: Two times a day (BID) | ORAL | Status: DC
  Administered 2018-05-10 – 2018-05-13 (×8): 1 via ORAL
  Filled 2018-05-10 (×9): qty 1

## 2018-05-10 NOTE — Consult Note (Signed)
Charlene Greene ION:[id number] DOB: 10/24/1922 DOA: 05/07/2018 PCP: [facility]  HPI  Charlene Greene is a 84 y.o. year old female with medical history significant for CVA (02/2018), sick sinus syndrome status post pacemaker placement, renal artery stenosis status post stent, CAD, diabetes mellitus type 2, hypertension who presented on 05/07/2018 from [facility] worsening nonproductive cough shortness of breath for 2 weeks and was found to have acute on chronic hypoxic respiratory failure presumed secondary to right lower lobe pneumonia (aspiration?/post obstructive) and likely CHF exacerbation. She was seen by her PCP and given azithromycin and prednisone which she started the day prior to presentation at her nursing facility.  She was still notably short of breath there and was brought to ED for further evaluation.   Interval History I was asked to see the patient because of ongoing breathlessness and increased 02 needs. The patient has  A nagging cough that has been dry for the most part       Assessment/Plan: Principal Problem:   HCAP (healthcare-associated pneumonia) Active Problems:   Stage 3 chronic kidney disease (HCC)   Type 2 diabetes mellitus with peripheral neuropathy (HCC)   Left thalamic infarction (HCC)   Diabetes mellitus type 2 in nonobese (HCC)   Chronic diastolic congestive heart failure (HCC)   PAD (peripheral artery disease) (HCC)   Acute respiratory failure with hypoxia (HCC)   .    Hypertension, stable.  Continue home Norvasc, lisinopril, Imdur, Maxzide and Toprol.  Acute on Chronic Diastolic CHF exacerbation  BNP slightly elevated greater than 400.  Last TTE 02/2017 with preserved EF and grade 1 diastolic dysfunction. CXR shows mild pulmonary edema a few days ago. Net negative 2.4 L. Redose IV lasix. Strict I/os, daily weights.    Type 2 diabetes.  A1c 7.  Holding home Amaryl.  Sliding scale coverage for now.  CVA on 02/2018.  Continue  Plavix, Zetia, fenofibrate.  Sick sinus syndrome status post his pacemaker placement.  Monitor telemetry  CAD.  Stable.  Denies any chest pain. Troponin negative. Imdur, ranexa   Code Status:DNR  Family Communication: Spoke with Daughter in law [place], [phone]  Disposition Plan: IV antibiotics, IV lasix,culture data, pulmonary consult, improvement in respiratory status   Consultants:  Pulmonary  Procedures:  none   Antimicrobials: Anti-infectives (From admission, onward)   Start     Dose/Rate Route Frequency Ordered Stop   05/10/18 0200  vancomycin (VANCOCIN) IVPB 1000 mg/200 mL premix     1,000 mg 200 mL/hr over 60 Minutes Intravenous Every 48 hours 05/08/18 0152     05/08/18 0600  ceFEPIme (MAXIPIME) 1 g in sodium chloride 0.9 % 100 mL IVPB  Status:  Discontinued     1 g 200 mL/hr over 30 Minutes Intravenous Every 8 hours 05/08/18 0145 05/08/18 0153   05/08/18 0200  vancomycin (VANCOCIN) 1,500 mg in sodium chloride 0.9 % 500 mL IVPB     1,500 mg 250 mL/hr over 120 Minutes Intravenous  Once 05/08/18 0150 05/08/18 0453   05/08/18 0200  ceFEPIme (MAXIPIME) 1 g in sodium chloride 0.9 % 100 mL IVPB     1 g 200 mL/hr over 30 Minutes Intravenous Every 24 hours 05/08/18 0153 05/16/18 0159        Cultures:  Strep pneumo negative  Legionella negative  Sputum culture-pending (not collected, nonproductive cough)  Telemetry: YES  DVT prophylaxis: Lovenox   Objective: Vitals:   05/10/18 0758 05/10/18 1121 05/10/18 1221 05/10/18 1308  BP:   Marland Kitchen)  141/67   Pulse:   62   Resp:   20   Temp:   97.9 F (36.6 C)   TempSrc:   Oral   SpO2: 93% 94% 95% 93%  Weight:      Height:        Intake/Output Summary (Last 24 hours) at 05/10/2018 1539 Last data filed at 05/10/2018 1500 Gross per 24 hour  Intake 614.92 ml  Output 1780 ml  Net -1165.08 ml   Filed Weights   05/08/18 0005 05/09/18 0408 05/10/18 0625  Weight: 164 lb 14.4 oz (74.8 kg) 162 lb (73.5 kg) 160 lb  3.2 oz (72.7 kg)    Exam:  Constitutional:normal appearing female, in no distress. Patient appears younger than stated age. Eyes: EOMI, anicteric, normal conjunctivae ENMT: Oropharynx with moist mucous membranes, normal dentition Cardiovascular: RRR no MRGs, with no peripheral edema  Respiratory:: Bronchial BS at the right base, no major wheezing  Abdomen: Soft,non-tender,  Skin: No rash ulcers, or lesions. Without skin tenting  Neurologic: Grossly no focal neuro deficit. Psychiatric: Appropriate affect, and mood. Mental status AAOx3  Data Reviewed: CBC: Recent Labs  Lab 05/07/18 1926 05/08/18 0634 05/10/18 0502  WBC 11.5* 10.1 8.0  NEUTROABS 9.9*  --   --   HGB 13.3 12.4 13.0  HCT 41.9 38.4 40.5  MCV 90.3 89.1 87.9  PLT 286 280 290   Basic Metabolic Panel: Recent Labs  Lab 05/07/18 2111 05/08/18 0634 05/09/18 0909 05/10/18 0502  NA 136 139 138 133*  K 4.1 3.8 3.9 3.9  CL 97* 100 95* 93*  CO2 27 30 32 32  GLUCOSE 206* 130* 144* 189*  BUN 19 13 12 19   CREATININE 0.91 0.74 0.85 0.87  CALCIUM 8.4* 8.5* 8.6* 8.2*   GFR: Estimated Creatinine Clearance: 36 mL/min (by C-G formula based on SCr of 0.87 mg/dL). Liver Function Tests: No results for input(s): AST, ALT, ALKPHOS, BILITOT, PROT, ALBUMIN in the last 168 hours. No results for input(s): LIPASE, AMYLASE in the last 168 hours. No results for input(s): AMMONIA in the last 168 hours. Coagulation Profile: No results for input(s): INR, PROTIME in the last 168 hours. Cardiac Enzymes: Recent Labs  Lab 05/07/18 2111 05/08/18 0634  TROPONINI <0.03 <0.03   BNP (last 3 results) No results for input(s): PROBNP in the last 8760 hours. HbA1C: No results for input(s): HGBA1C in the last 72 hours. CBG: Recent Labs  Lab 05/09/18 1205 05/09/18 1620 05/09/18 2230 05/10/18 0831 05/10/18 1215  GLUCAP 193* 161* 184* 151* 287*   Lipid Profile: No results for input(s): CHOL, HDL, LDLCALC, TRIG, CHOLHDL, LDLDIRECT in  the last 72 hours. Thyroid Function Tests: No results for input(s): TSH, T4TOTAL, FREET4, T3FREE, THYROIDAB in the last 72 hours. Anemia Panel: No results for input(s): VITAMINB12, FOLATE, FERRITIN, TIBC, IRON, RETICCTPCT in the last 72 hours. Urine analysis:    Component Value Date/Time   COLORURINE YELLOW 02/22/2018 2000   APPEARANCEUR CLEAR 02/22/2018 2000   LABSPEC 1.013 02/22/2018 2000   PHURINE 6.0 02/22/2018 2000   GLUCOSEU 50 (A) 02/22/2018 2000   HGBUR NEGATIVE 02/22/2018 2000   BILIRUBINUR NEGATIVE 02/22/2018 2000   KETONESUR NEGATIVE 02/22/2018 2000   PROTEINUR NEGATIVE 02/22/2018 2000   NITRITE NEGATIVE 02/22/2018 2000   LEUKOCYTESUR MODERATE (A) 02/22/2018 2000   Sepsis Labs: @LABRCNTIP (procalcitonin:4,lacticidven:4)  ) Recent Results (from the past 240 hour(s))  MRSA PCR Screening     Status: None   Collection Time: 05/08/18  3:23 AM  Result Value Ref Range  Status   MRSA by PCR NEGATIVE NEGATIVE Final    Comment:        The GeneXpert MRSA Assay (FDA approved for NASAL specimens only), is one component of a comprehensive MRSA colonization surveillance program. It is not intended to diagnose MRSA infection nor to guide or monitor treatment for MRSA infections. Performed at [facility], [address] [place], [place] [address]     Assessment::  Pneumonia:: The patient presented a few days ago with a productive cough and increased 02 needs. She has a large RLL consolidation not see a few months ago on her plain CXR. Her WBC is within the normal range. MRSA screen was negative Urine for Strep and legionella tests were negative. Recent CTA Chest was negative for PE. No overt signs of a malignancy.  I see that there does not appear to be any large airway obstruction. The patient has a negative smoking history. The patient is getting Duoneb therapy but she is not sure is is helping her. I am not sure I would change  Your therapy at this time. I  would try not to subject this patient to an invasive procedure ( bronchoscopy) at this time. The patient is on a regimen of Cefipime and Vanco presently. I will request new BNP and CXR for tomorrow. There may be an element of superimposed CHF as well.

## 2018-05-10 NOTE — Progress Notes (Signed)
Pt has been resting in bed this shift. Family is to visit. Pt has been coughing with non productive cough. New orders rec'd for cough and administered as ordered.  Cough meds helpful.

## 2018-05-10 NOTE — Plan of Care (Signed)
  Problem: Clinical Measurements: Goal: Will remain free from infection Outcome: Progressing Goal: Diagnostic test results will improve Outcome: Progressing   

## 2018-05-10 NOTE — Progress Notes (Signed)
Notified by CCMD that pt is having frequent PVC's. Pt has pacemaker and is A paced.  Pt is resting in bed, no complaints, ate breakfast. Family visiting. Dr. Atilano MedianS. Netty notified.

## 2018-05-10 NOTE — Progress Notes (Addendum)
PROGRESS NOTE  Charlene Greene ZOX:[id number] DOB: Apr 02, 1922 DOA: 05/07/2018 PCP: [facility]  HPI  Charlene Greene is a 84 y.o. year old female with medical history significant for CVA (02/2018), sick sinus syndrome status post pacemaker placement, renal artery stenosis status post stent, CAD, diabetes mellitus type 2, hypertension who presented on 05/07/2018 from [facility] worsening nonproductive cough shortness of breath for 2 weeks and was found to have acute on chronic hypoxic respiratory failure presumed secondary to right lower lobe pneumonia (aspiration?/post obstructive) and likely CHF exacerbation. She was seen by her PCP and given azithromycin and prednisone which she started the day prior to presentation at her nursing facility.  She was still notably short of breath there and was brought to ED for further evaluation.   Interval History  No acute events overnight  ROS:    Subjective Coughing quite a bit. Nothing coming up. Feels breathing is stable  Assessment/Plan: Principal Problem:   HCAP (healthcare-associated pneumonia) Active Problems:   Stage 3 chronic kidney disease (HCC)   Type 2 diabetes mellitus with peripheral neuropathy (HCC)   Left thalamic infarction (HCC)   Diabetes mellitus type 2 in nonobese (HCC)   Chronic diastolic congestive heart failure (HCC)   PAD (peripheral artery disease) (HCC)   Acute respiratory failure with hypoxia (HCC)  Acute on chronic hypoxic respiratory failure, likely multifactorial etiology.  Admission CTA chest negative for PE but concerning for RLL consolidation possibly representing postobstructive atelectasis or PNA. MBS shows residuals concerning for dysphagia as risk factor for possible aspiration PNA. Will follow dysphagia 3 diet. Stable O2 at 5 L( 2 L at home nightly) but little improvement in respiratory exam. Empiric vancomycin and cefepime, unfortunately nonproductive cough so no culture to guide.   Continue IV lasix given pulmonary edema on recent CXR.  Added mucinex today. Will consult pulm for further assistance   strep pneumo and Legionella negative.  Supportive care with flutter valve, O2, mobilization of secretions/bed to chair duo nebs QID.    Hypertension, stable.  Continue home Norvasc, lisinopril, Imdur, Maxzide and Toprol.  Acute on Chronic Diastolic CHF exacerbation  BNP slightly elevated greater than 400.  Last TTE 02/2017 with preserved EF and grade 1 diastolic dysfunction. CXR shows mild pulmonary edema a few days ago. Net negative 2.4 L. Redose IV lasix. Strict I/os, daily weights.    Type 2 diabetes.  A1c 7.  Holding home Amaryl.  Sliding scale coverage for now.  CVA on 02/2018.  Continue Plavix, Zetia, fenofibrate.  Sick sinus syndrome status post his pacemaker placement.  Monitor telemetry  CAD.  Stable.  Denies any chest pain. Troponin negative. Imdur, ranexa   Code Status:DNR  Family Communication: Spoke with Daughter in law [place], [phone]  Disposition Plan: IV antibiotics, IV lasix,culture data, pulmonary consult, improvement in respiratory status   Consultants:  Pulmonary  Procedures:  none   Antimicrobials: Anti-infectives (From admission, onward)   Start     Dose/Rate Route Frequency Ordered Stop   05/10/18 0200  vancomycin (VANCOCIN) IVPB 1000 mg/200 mL premix     1,000 mg 200 mL/hr over 60 Minutes Intravenous Every 48 hours 05/08/18 0152     05/08/18 0600  ceFEPIme (MAXIPIME) 1 g in sodium chloride 0.9 % 100 mL IVPB  Status:  Discontinued     1 g 200 mL/hr over 30 Minutes Intravenous Every 8 hours 05/08/18 0145 05/08/18 0153   05/08/18 0200  vancomycin (VANCOCIN) 1,500 mg in sodium chloride 0.9 % 500 mL IVPB  1,500 mg 250 mL/hr over 120 Minutes Intravenous  Once 05/08/18 0150 05/08/18 0453   05/08/18 0200  ceFEPIme (MAXIPIME) 1 g in sodium chloride 0.9 % 100 mL IVPB     1 g 200 mL/hr over 30 Minutes Intravenous Every 24 hours  05/08/18 0153 05/16/18 0159        Cultures:  Strep pneumo negative  Legionella negative  Sputum culture-pending (not collected, nonproductive cough)  Telemetry: YES  DVT prophylaxis: Lovenox   Objective: Vitals:   05/10/18 0758 05/10/18 1121 05/10/18 1221 05/10/18 1308  BP:   (!) 141/67   Pulse:   62   Resp:   20   Temp:   97.9 F (36.6 C)   TempSrc:   Oral   SpO2: 93% 94% 95% 93%  Weight:      Height:        Intake/Output Summary (Last 24 hours) at 05/10/2018 1323 Last data filed at 05/10/2018 1121 Gross per 24 hour  Intake 394.92 ml  Output 1051 ml  Net -656.08 ml   Filed Weights   05/08/18 0005 05/09/18 0408 05/10/18 0625  Weight: 74.8 kg (164 lb 14.4 oz) 73.5 kg (162 lb) 72.7 kg (160 lb 3.2 oz)    Exam:  Constitutional:normal appearing female, in no distress Eyes: EOMI, anicteric, normal conjunctivae ENMT: Oropharynx with moist mucous membranes, normal dentition Cardiovascular: RRR no MRGs, with no peripheral edema Respiratory: Normal respiratory effort on 5 L [place], diminished breath sounds throughout. Increased congestion heard in upper airway  Abdomen: Soft,non-tender,  Skin: No rash ulcers, or lesions. Without skin tenting  Neurologic: Grossly no focal neuro deficit. Psychiatric:Appropriate affect, and mood. Mental status AAOx3  Data Reviewed: CBC: Recent Labs  Lab 05/07/18 1926 05/08/18 0634 05/10/18 0502  WBC 11.5* 10.1 8.0  NEUTROABS 9.9*  --   --   HGB 13.3 12.4 13.0  HCT 41.9 38.4 40.5  MCV 90.3 89.1 87.9  PLT 286 280 290   Basic Metabolic Panel: Recent Labs  Lab 05/07/18 2111 05/08/18 0634 05/09/18 0909 05/10/18 0502  NA 136 139 138 133*  K 4.1 3.8 3.9 3.9  CL 97* 100 95* 93*  CO2 27 30 32 32  GLUCOSE 206* 130* 144* 189*  BUN 19 13 12 19   CREATININE 0.91 0.74 0.85 0.87  CALCIUM 8.4* 8.5* 8.6* 8.2*   GFR: Estimated Creatinine Clearance: 36 mL/min (by C-G formula based on SCr of 0.87 mg/dL). Liver Function Tests: No  results for input(s): AST, ALT, ALKPHOS, BILITOT, PROT, ALBUMIN in the last 168 hours. No results for input(s): LIPASE, AMYLASE in the last 168 hours. No results for input(s): AMMONIA in the last 168 hours. Coagulation Profile: No results for input(s): INR, PROTIME in the last 168 hours. Cardiac Enzymes: Recent Labs  Lab 05/07/18 2111 05/08/18 0634  TROPONINI <0.03 <0.03   BNP (last 3 results) No results for input(s): PROBNP in the last 8760 hours. HbA1C: No results for input(s): HGBA1C in the last 72 hours. CBG: Recent Labs  Lab 05/09/18 1205 05/09/18 1620 05/09/18 2230 05/10/18 0831 05/10/18 1215  GLUCAP 193* 161* 184* 151* 287*   Lipid Profile: No results for input(s): CHOL, HDL, LDLCALC, TRIG, CHOLHDL, LDLDIRECT in the last 72 hours. Thyroid Function Tests: No results for input(s): TSH, T4TOTAL, FREET4, T3FREE, THYROIDAB in the last 72 hours. Anemia Panel: No results for input(s): VITAMINB12, FOLATE, FERRITIN, TIBC, IRON, RETICCTPCT in the last 72 hours. Urine analysis:    Component Value Date/Time   COLORURINE YELLOW 02/22/2018 2000  APPEARANCEUR CLEAR 02/22/2018 2000   LABSPEC 1.013 02/22/2018 2000   PHURINE 6.0 02/22/2018 2000   GLUCOSEU 50 (A) 02/22/2018 2000   HGBUR NEGATIVE 02/22/2018 2000   BILIRUBINUR NEGATIVE 02/22/2018 2000   KETONESUR NEGATIVE 02/22/2018 2000   PROTEINUR NEGATIVE 02/22/2018 2000   NITRITE NEGATIVE 02/22/2018 2000   LEUKOCYTESUR MODERATE (A) 02/22/2018 2000   Sepsis Labs: @LABRCNTIP (procalcitonin:4,lacticidven:4)  ) Recent Results (from the past 240 hour(s))  MRSA PCR Screening     Status: None   Collection Time: 05/08/18  3:23 AM  Result Value Ref Range Status   MRSA by PCR NEGATIVE NEGATIVE Final    Comment:        The GeneXpert MRSA Assay (FDA approved for NASAL specimens only), is one component of a comprehensive MRSA colonization surveillance program. It is not intended to diagnose MRSA infection nor to guide  or monitor treatment for MRSA infections. Performed at [facility], [address], [place], [place] [address]       Studies: No results found.  Scheduled Meds: . amLODipine  5 mg Oral BID  . clopidogrel  75 mg Oral Daily  . dextromethorphan-guaiFENesin  1 tablet Oral BID  . enoxaparin (LOVENOX) injection  40 mg Subcutaneous Q24H  . feeding supplement (ENSURE ENLIVE)  237 mL Oral BID BM  . furosemide  40 mg Intravenous Once  . furosemide  10 mg Oral Daily  . insulin aspart  0-9 Units Subcutaneous TID WC  . ipratropium-albuterol  3 mL Inhalation TID  . isosorbide mononitrate  60 mg Oral Daily  . lisinopril  30 mg Oral Daily  . magnesium oxide  400 mg Oral QHS  . metoprolol succinate  100 mg Oral Daily  . multivitamin with minerals  1 tablet Oral Daily  . ranolazine  500 mg Oral BID  . triamterene-hydrochlorothiazide  1 tablet Oral Daily    Continuous Infusions: . sodium chloride Stopped (05/10/18 0350)  . ceFEPime (MAXIPIME) IV Stopped (05/10/18 0317)  . vancomycin 1,000 mg (05/10/18 0401)     LOS: 3 days     Laverna Peace, MD [facility] Pager [phone]  If 7PM-7AM, please contact night-coverage [email or web address] Password [email or web address] 05/10/2018, 1:23 PM

## 2018-05-11 ENCOUNTER — Inpatient Hospital Stay (HOSPITAL_COMMUNITY): Payer: Medicare Other

## 2018-05-11 DIAGNOSIS — E119 Type 2 diabetes mellitus without complications: Secondary | ICD-10-CM

## 2018-05-11 LAB — BASIC METABOLIC PANEL WITH GFR
Anion gap: 11 (ref 5–15)
BUN: 21 mg/dL (ref 8–23)
CO2: 33 mmol/L — ABNORMAL HIGH (ref 22–32)
Calcium: 8.4 mg/dL — ABNORMAL LOW (ref 8.9–10.3)
Chloride: 90 mmol/L — ABNORMAL LOW (ref 98–111)
Creatinine, Ser: 0.89 mg/dL (ref 0.44–1.00)
GFR calc Af Amer: >60 mL/min
GFR calc non Af Amer: 54 mL/min — ABNORMAL LOW
Glucose, Bld: 134 mg/dL — ABNORMAL HIGH (ref 70–99)
Potassium: 4.1 mmol/L (ref 3.5–5.1)
Sodium: 134 mmol/L — ABNORMAL LOW (ref 135–145)

## 2018-05-11 LAB — GLUCOSE, CAPILLARY
Glucose-Capillary: 128 mg/dL — ABNORMAL HIGH (ref 70–99)
Glucose-Capillary: 285 mg/dL — ABNORMAL HIGH (ref 70–99)
Glucose-Capillary: 138 mg/dL — ABNORMAL HIGH (ref 70–99)
Glucose-Capillary: 114 mg/dL — ABNORMAL HIGH (ref 70–99)

## 2018-05-11 MED ORDER — FUROSEMIDE 10 MG/ML IJ SOLN
40.0000 mg | Freq: Once | INTRAMUSCULAR | Status: AC
  Administered 2018-05-11: 40 mg via INTRAVENOUS
  Filled 2018-05-11: qty 4

## 2018-05-11 NOTE — Progress Notes (Signed)
Patient VSS, sleeping and comfortable. Report given to oncoming RN.

## 2018-05-11 NOTE — Progress Notes (Signed)
PROGRESS NOTE  Charlene Greene ZOX:[id number] DOB: 1922-07-05 DOA: 05/07/2018 PCP: Almetta Lovely, [facility]  HPI  Charlene Greene is a 84 y.o. year old female with medical history significant for CVA (02/2018), sick sinus syndrome status post pacemaker placement, renal artery stenosis status post stent, CAD, diabetes mellitus type 2, hypertension who presented on 05/07/2018 from [facility] worsening nonproductive cough shortness of breath for 2 weeks and was found to have acute on chronic hypoxic respiratory failure presumed secondary to right lower lobe pneumonia (aspiration?/post obstructive) and likely CHF exacerbation. She was seen by her PCP and given azithromycin and prednisone which she started the day prior to presentation at her nursing facility.  She was still notably short of breath there and was brought to ED for further evaluation.   Interval History  No acute events overnight  ROS:    Subjective Feels breathing is much better.   Assessment/Plan: Principal Problem:   HCAP (healthcare-associated pneumonia) Active Problems:   Stage 3 chronic kidney disease (HCC)   Type 2 diabetes mellitus with peripheral neuropathy (HCC)   Left thalamic infarction (HCC)   Diabetes mellitus type 2 in nonobese (HCC)   Chronic diastolic congestive heart failure (HCC)   PAD (peripheral artery disease) (HCC)   Acute respiratory failure with hypoxia (HCC)  Acute on chronic hypoxic respiratory failure, likely multifactorial etiology.  Admission CTA chest negative for PE but concerning for RLL consolidation possibly representing postobstructive atelectasis or PNA. Repeat CXr this am shows small layering R pleural effusion and underlying opacities. MBS shows residuals concerning for dysphagia as risk factor for possible aspiration PNA. Will follow dysphagia 3 diet. Stable O2 at 5 L( 2 L at home nightly). D/c vancomycin. Continue cefepime, unfortunately nonproductive cough so no culture to  guide.  Continue IV lasix given effusions and improvement yesterday. Supportive care with mucinex,  Appreciate pulm recommendations.  Strep pneumo and Legionella negative.  Supportive care with mucinex, flutter valve, O2, mobilization of secretions/bed to chair duo nebs QID.    Hypertension, stable.  Continue home Norvasc, lisinopril, Imdur, Maxzide and Toprol.  Acute on Chronic Diastolic CHF exacerbation  BNP slightly elevated greater than 400.  Last TTE 02/2017 with preserved EF and grade 1 diastolic dysfunction. CXR with small r pleural effusion, seems pulmonary edema from before is imrpoving. Net negative 2.3L. Redose IV lasix 40 mg x 1. Strict I/os, daily weights.    Type 2 diabetes.  A1c 7.  Holding home Amaryl.  Sliding scale coverage for now.  CVA on 02/2018.  Continue Plavix, Zetia, fenofibrate.  Sick sinus syndrome status post his pacemaker placement.  Monitor telemetry  CAD.  Stable.  Denies any chest pain. Troponin negative. Imdur, ranexa   Code Status:DNR  Family Communication: will call family  Disposition Plan: IV antibiotics, IV lasix,culture data, pulmonary consult, improvement in respiratory status   Consultants:  Pulmonary  Procedures:  none   Antimicrobials: Anti-infectives (From admission, onward)   Start     Dose/Rate Route Frequency Ordered Stop   05/10/18 0200  vancomycin (VANCOCIN) IVPB 1000 mg/200 mL premix  Status:  Discontinued     1,000 mg 200 mL/hr over 60 Minutes Intravenous Every 48 hours 05/08/18 0152 05/11/18 0818   05/08/18 0600  ceFEPIme (MAXIPIME) 1 g in sodium chloride 0.9 % 100 mL IVPB  Status:  Discontinued     1 g 200 mL/hr over 30 Minutes Intravenous Every 8 hours 05/08/18 0145 05/08/18 0153   05/08/18 0200  vancomycin (VANCOCIN) 1,500 mg in sodium  chloride 0.9 % 500 mL IVPB     1,500 mg 250 mL/hr over 120 Minutes Intravenous  Once 05/08/18 0150 05/08/18 0453   05/08/18 0200  ceFEPIme (MAXIPIME) 1 g in sodium chloride 0.9 % 100 mL  IVPB     1 g 200 mL/hr over 30 Minutes Intravenous Every 24 hours 05/08/18 0153 05/16/18 0159        Cultures:  Strep pneumo negative  Legionella negative  Sputum culture-pending (not collected, nonproductive cough)  Telemetry: YES  DVT prophylaxis: Lovenox   Objective: Vitals:   05/10/18 2120 05/11/18 0645 05/11/18 0737 05/11/18 1203  BP: (!) 155/63 (!) 152/81  115/61  Pulse:  66  62  Resp:  18  18  Temp:  97.8 F (36.6 C)  97.8 F (36.6 C)  TempSrc:  Oral  Oral  SpO2:  98% 93% 96%  Weight:  72.4 kg (159 lb 11.2 oz)    Height:        Intake/Output Summary (Last 24 hours) at 05/11/2018 1314 Last data filed at 05/11/2018 0954 Gross per 24 hour  Intake 1320.53 ml  Output 878 ml  Net 442.53 ml   Filed Weights   05/09/18 0408 05/10/18 0625 05/11/18 0645  Weight: 73.5 kg (162 lb) 72.7 kg (160 lb 3.2 oz) 72.4 kg (159 lb 11.2 oz)    Exam:  Constitutional:normal appearing female, in no distress Eyes: EOMI, anicteric, normal conjunctivae ENMT: Oropharynx with moist mucous membranes, normal dentition Cardiovascular: RRR no MRGs, with no peripheral edema Respiratory: Normal respiratory effort on 5 L [place], breath sounds throughout, no rales, slight rhonchi intermittently  Abdomen: Soft,non-tender,  Skin: No rash ulcers, or lesions. Without skin tenting  Neurologic: Grossly no focal neuro deficit. Psychiatric:Appropriate affect, and mood. Mental status AAOx3  Data Reviewed: CBC: Recent Labs  Lab 05/07/18 1926 05/08/18 0634 05/10/18 0502  WBC 11.5* 10.1 8.0  NEUTROABS 9.9*  --   --   HGB 13.3 12.4 13.0  HCT 41.9 38.4 40.5  MCV 90.3 89.1 87.9  PLT 286 280 290   Basic Metabolic Panel: Recent Labs  Lab 05/07/18 2111 05/08/18 0634 05/09/18 0909 05/10/18 0502 05/11/18 0700  NA 136 139 138 133* 134*  K 4.1 3.8 3.9 3.9 4.1  CL 97* 100 95* 93* 90*  CO2 27 30 32 32 33*  GLUCOSE 206* 130* 144* 189* 134*  BUN 19 13 12 19 21   CREATININE 0.91 0.74 0.85 0.87  0.89  CALCIUM 8.4* 8.5* 8.6* 8.2* 8.4*   GFR: Estimated Creatinine Clearance: 35.1 mL/min (by C-G formula based on SCr of 0.89 mg/dL). Liver Function Tests: No results for input(s): AST, ALT, ALKPHOS, BILITOT, PROT, ALBUMIN in the last 168 hours. No results for input(s): LIPASE, AMYLASE in the last 168 hours. No results for input(s): AMMONIA in the last 168 hours. Coagulation Profile: No results for input(s): INR, PROTIME in the last 168 hours. Cardiac Enzymes: Recent Labs  Lab 05/07/18 2111 05/08/18 0634  TROPONINI <0.03 <0.03   BNP (last 3 results) No results for input(s): PROBNP in the last 8760 hours. HbA1C: No results for input(s): HGBA1C in the last 72 hours. CBG: Recent Labs  Lab 05/10/18 1215 05/10/18 1625 05/10/18 2156 05/11/18 0738 05/11/18 1202  GLUCAP 287* 148* 163* 128* 285*   Lipid Profile: No results for input(s): CHOL, HDL, LDLCALC, TRIG, CHOLHDL, LDLDIRECT in the last 72 hours. Thyroid Function Tests: No results for input(s): TSH, T4TOTAL, FREET4, T3FREE, THYROIDAB in the last 72 hours. Anemia Panel: No results for  input(s): VITAMINB12, FOLATE, FERRITIN, TIBC, IRON, RETICCTPCT in the last 72 hours. Urine analysis:    Component Value Date/Time   COLORURINE YELLOW 02/22/2018 2000   APPEARANCEUR CLEAR 02/22/2018 2000   LABSPEC 1.013 02/22/2018 2000   PHURINE 6.0 02/22/2018 2000   GLUCOSEU 50 (A) 02/22/2018 2000   HGBUR NEGATIVE 02/22/2018 2000   BILIRUBINUR NEGATIVE 02/22/2018 2000   KETONESUR NEGATIVE 02/22/2018 2000   PROTEINUR NEGATIVE 02/22/2018 2000   NITRITE NEGATIVE 02/22/2018 2000   LEUKOCYTESUR MODERATE (A) 02/22/2018 2000   Sepsis Labs: @LABRCNTIP (procalcitonin:4,lacticidven:4)  ) Recent Results (from the past 240 hour(s))  MRSA PCR Screening     Status: None   Collection Time: 05/08/18  3:23 AM  Result Value Ref Range Status   MRSA by PCR NEGATIVE NEGATIVE Final    Comment:        The GeneXpert MRSA Assay (FDA approved for NASAL  specimens only), is one component of a comprehensive MRSA colonization surveillance program. It is not intended to diagnose MRSA infection nor to guide or monitor treatment for MRSA infections. Performed at [facility], [address] [place], [place] [address]       Studies: Dg Chest 2 View  Result Date: 05/11/2018 CLINICAL DATA:  History of pneumonia. EXAM: CHEST - 2 VIEW COMPARISON:  Chest radiograph 05/08/2018 FINDINGS: Multi lead pacer apparatus overlies the left hemithorax. Stable cardiomegaly. Aortic atherosclerosis. Small layering right pleural effusion with underlying opacities favored represent atelectasis. Thoracic spine degenerative changes. IMPRESSION: Small layering right pleural effusion with underlying opacities favored to represent atelectasis. Cardiomegaly. Electronically Signed   By: Annia Beltrew  Davis M.D.   On: 05/11/2018 13:10    Scheduled Meds: . amLODipine  5 mg Oral BID  . clopidogrel  75 mg Oral Daily  . dextromethorphan-guaiFENesin  1 tablet Oral BID  . enoxaparin (LOVENOX) injection  40 mg Subcutaneous Q24H  . feeding supplement (ENSURE ENLIVE)  237 mL Oral BID BM  . furosemide  10 mg Oral Daily  . insulin aspart  0-9 Units Subcutaneous TID WC  . ipratropium-albuterol  3 mL Inhalation TID  . isosorbide mononitrate  60 mg Oral Daily  . lisinopril  30 mg Oral Daily  . magnesium oxide  400 mg Oral QHS  . metoprolol succinate  100 mg Oral Daily  . multivitamin with minerals  1 tablet Oral Daily  . ranolazine  500 mg Oral BID  . triamterene-hydrochlorothiazide  1 tablet Oral Daily    Continuous Infusions: . sodium chloride Stopped (05/10/18 0335)  . ceFEPime (MAXIPIME) IV 1 g (05/11/18 0219)     LOS: 4 days     Laverna PeaceShayla D Nettey, MD [facility] Pager [phone]  If 7PM-7AM, please contact night-coverage [email or web address] Password [email or web address] 05/11/2018, 1:14 PM

## 2018-05-11 NOTE — Plan of Care (Signed)
  Problem: Clinical Measurements: Goal: Ability to maintain clinical measurements within normal limits will improve Outcome: Progressing   Problem: Cardiac: Goal: Ability to achieve and maintain adequate cardiopulmonary perfusion will improve Outcome: Progressing   Problem: Respiratory: Goal: Ability to maintain a clear airway will improve Outcome: Progressing

## 2018-05-11 NOTE — Plan of Care (Signed)
  Problem: Activity: Goal: Capacity to carry out activities will improve Outcome: Not Progressing - Encouraged pt to be out of bed and in recliner

## 2018-05-11 NOTE — Progress Notes (Signed)
Pt up in recliner chair x several hours and tolerated well.  Cough is loose, non productive cough. Cough is decreased from yesterday.

## 2018-05-12 DIAGNOSIS — J9601 Acute respiratory failure with hypoxia: Secondary | ICD-10-CM

## 2018-05-12 DIAGNOSIS — L8990 Pressure ulcer of unspecified site, unspecified stage: Secondary | ICD-10-CM

## 2018-05-12 LAB — BASIC METABOLIC PANEL
Anion gap: 11 (ref 5–15)
BUN: 27 mg/dL — ABNORMAL HIGH (ref 8–23)
CALCIUM: 8.3 mg/dL — AB (ref 8.9–10.3)
CHLORIDE: 88 mmol/L — AB (ref 98–111)
CO2: 31 mmol/L (ref 22–32)
CREATININE: 0.94 mg/dL (ref 0.44–1.00)
GFR calc Af Amer: 59 mL/min — ABNORMAL LOW (ref >60)
GFR calc non Af Amer: 51 mL/min — ABNORMAL LOW (ref >60)
GLUCOSE: 150 mg/dL — AB (ref 70–99)
Potassium: 4.5 mmol/L (ref 3.5–5.1)
Sodium: 130 mmol/L — ABNORMAL LOW (ref 135–145)

## 2018-05-12 LAB — GLUCOSE, CAPILLARY
GLUCOSE-CAPILLARY: 280 mg/dL — AB (ref 70–99)
GLUCOSE-CAPILLARY: 182 mg/dL — AB (ref 70–99)
Glucose-Capillary: 137 mg/dL — ABNORMAL HIGH (ref 70–99)
Glucose-Capillary: 140 mg/dL — ABNORMAL HIGH (ref 70–99)

## 2018-05-12 MED ORDER — FUROSEMIDE 20 MG PO TABS
20.0000 mg | ORAL_TABLET | Freq: Once | ORAL | Status: AC
  Administered 2018-05-12: 20 mg via ORAL
  Filled 2018-05-12: qty 1

## 2018-05-12 MED ORDER — AMOXICILLIN-POT CLAVULANATE 875-125 MG PO TABS
1.0000 | ORAL_TABLET | Freq: Two times a day (BID) | ORAL | Status: DC
  Administered 2018-05-12 – 2018-05-13 (×4): 1 via ORAL
  Filled 2018-05-12 (×4): qty 1

## 2018-05-12 MED ORDER — FUROSEMIDE 40 MG PO TABS
40.0000 mg | ORAL_TABLET | Freq: Every day | ORAL | Status: DC
  Administered 2018-05-13: 40 mg via ORAL
  Filled 2018-05-12: qty 1

## 2018-05-12 NOTE — Progress Notes (Signed)
PROGRESS NOTE  Charlene Griffinsell Ronda XLK:[id number] DOB: [birth year] DOA: 05/07/2018 PCP: Almetta LovelyHousecalls, [facility]  HPI  Charlene Greene is a 84 y.o. year old female with medical history significant for CVA (02/2018), sick sinus syndrome status post pacemaker placement, renal artery stenosis status post stent, CAD, diabetes mellitus type 2, hypertension [facility] short[facility] of breath for 2 weeks and was found to have acute on chronic hypoxic respiratory failure presumed secondary to right lower lobe pneumonia (aspiration?/post obstructive) and likely CHF exacerbation. She was seen by her PCP and given azithromycin and prednisone which she started the day prior to presentation at her nursing facility.  She was still notably short of breath there and was brought to ED for further evaluation.   Hospital Course Patient was empirically treated with IV vancomycin and cefepime given concern for post obstructive pneumonia/atelectasis on initial admitting CTA open (which would out PE) disease.  Patient initially required 5 L of oxygen and had minimal improvement on empiric antibiotics.  Patient was continued on IV Lasix given repeat chest x-ray showed element of mild pulmonary edema.  Pulmonary was consulted who agreed with IV diuresis and antibiotics.  No acute events overnight  ROS:    Subjective Feels breathing is much better.   Assessment/Plan: Principal Problem:   HCAP (healthcare-associated pneumonia) Active Problems:   Stage 3 chronic kidney disease (HCC)   Type 2 diabetes mellitus with peripheral neuropathy (HCC)   Left thalamic infarction (HCC)   Diabetes mellitus type 2 in nonobese (HCC)   Chronic diastolic congestive heart failure (HCC)   PAD (peripheral artery disease) (HCC)   Acute respiratory failure with hypoxia (HCC)   Pressure injury of skin  Acute on chronic hypoxic respiratory failure, likely multifactorial  etiology, improving Repeat chest x-ray shows resolution of mild pulmonary edema.  Patient is net -3 L.  No crackles on exam and no peripheral edema.  Will transition from IV Lasix to oral Lasix on today.  Continue to monitor I/os.  Appreciate pulmonary recommendations.  Will discontinue duo nebs.  Wean oxygen (baseline O2 2 L).  Cough is nonproductive, patient hass remained afebrile, no risk factors for resistant multidrug organisms, strep pneumo and Legionella both negative, have already discontinued vancomycin will DC cefepime in favor of Augmentin and continue to monitor.Continue supportive care with Mucinex, flutter valve, incentive spirometry.      Hypertension, stable.  Continue home Norvasc, lisinopril, Imdur, Maxzide and Toprol.  Acute on Chronic Diastolic CHF exacerbation  BNP slightly elevated greater than 400.  Last TTE 02/2017 with preserved EF and grade 1 diastolic dysfunction. CXR with small r pleural effusion, seems pulmonary edema from before is imrpoving. Net negative 3L.  Transition to oral Lasix, increase to 40 mg. Strict I/os, daily weights.    Type 2 diabetes.  A1c 7.  Holding home Amaryl.  Sliding scale coverage for now.  CVA on 02/2018.  Continue Plavix, Zetia, fenofibrate.  Sick sinus syndrome status post his pacemaker placement.  Monitor telemetry  CAD.  Stable.  Denies any chest pain. Troponin negative. Imdur, ranexa   Code Status:DNR  Family Communication: Spoke with daughter-in-law Angelique BlonderDenise, [phone]    Disposition Plan: oral antibiotics, oral Lasix, monitor,culture data, pulmonary consult, improvement in respiratory status   Consultants:  Pulmonary  Procedures:  none   Antimicrobials: Anti-infectives (From admission, onward)   Start     Dose/Rate Route Frequency Ordered Stop   05/10/18 0200  vancomycin (VANCOCIN) IVPB 1000 mg/200 mL premix  Status:  Discontinued     1,000 mg 200 mL/hr over 60 Minutes Intravenous Every 48 hours 05/08/18 0152 05/11/18  0818   05/08/18 0600  ceFEPIme (MAXIPIME) 1 g in sodium chloride 0.9 % 100 mL IVPB  Status:  Discontinued     1 g 200 mL/hr over 30 Minutes Intravenous Every 8 hours 05/08/18 0145 05/08/18 0153   05/08/18 0200  vancomycin (VANCOCIN) 1,500 mg in sodium chloride 0.9 % 500 mL IVPB     1,500 mg 250 mL/hr over 120 Minutes Intravenous  Once 05/08/18 0150 05/08/18 0453   05/08/18 0200  ceFEPIme (MAXIPIME) 1 g in sodium chloride 0.9 % 100 mL IVPB     1 g 200 mL/hr over 30 Minutes Intravenous Every 24 hours 05/08/18 0153 05/16/18 0159        Cultures:  Strep pneumo negative  Legionella negative  Sputum culture-pending (not collected, nonproductive cough)  Telemetry: YES  DVT prophylaxis: Lovenox   Objective: Vitals:   05/12/18 0230 05/12/18 0644 05/12/18 0910 05/12/18 0930  BP:  (!) 146/81  138/63  Pulse:  (!) 101  65  Resp:  18    Temp:  98 F (36.7 C)    TempSrc:  Oral    SpO2:  93% 93%   Weight: 73 kg (160 lb 15 oz)     Height:        Intake/Output Summary (Last 24 hours) at 05/12/2018 1015 Last data filed at 05/12/2018 0231 Gross per 24 hour  Intake 480 ml  Output 1200 ml  Net -720 ml   Filed Weights   05/10/18 0625 05/11/18 0645 05/12/18 0230  Weight: 72.7 kg (160 lb 3.2 oz) 72.4 kg (159 lb 11.2 oz) 73 kg (160 lb 15 oz)    Exam:  Constitutional:normal appearing female, in no distress Eyes: EOMI, anicteric, normal conjunctivae ENMT: Oropharynx with moist mucous membranes, normal dentition Cardiovascular: RRR no MRGs, with no peripheral edema Respiratory: Normal respiratory effort on 4 L [place], breath sounds throughout, no rales, intermittent wheezing throughout and rhonci  Abdomen: Soft,non-tender,  Skin: No rash ulcers, or lesions. Without skin tenting  Neurologic: Grossly no focal neuro deficit. Psychiatric:Appropriate affect, and mood. Mental status AAOx3  Data Reviewed: CBC: Recent Labs  Lab 05/07/18 1926 05/08/18 0634 05/10/18 0502  WBC 11.5* 10.1  8.0  NEUTROABS 9.9*  --   --   HGB 13.3 12.4 13.0  HCT 41.9 38.4 40.5  MCV 90.3 89.1 87.9  PLT 286 280 290   Basic Metabolic Panel: Recent Labs  Lab 05/08/18 0634 05/09/18 0909 05/10/18 0502 05/11/18 0700 05/12/18 0554  NA 139 138 133* 134* 130*  K 3.8 3.9 3.9 4.1 4.5  CL 100 95* 93* 90* 88*  CO2 30 32 32 33* 31  GLUCOSE 130* 144* 189* 134* 150*  BUN 13 12 19 21  27*  CREATININE 0.74 0.85 0.87 0.89 0.94  CALCIUM 8.5* 8.6* 8.2* 8.4* 8.3*   GFR: Estimated Creatinine Clearance: 33.4 mL/min (by C-G formula based on SCr of 0.94 mg/dL). Liver Function Tests: No results for input(s): AST, ALT, ALKPHOS, BILITOT, PROT, ALBUMIN in the last 168 hours. No results for input(s): LIPASE, AMYLASE in the last 168 hours. No results for input(s): AMMONIA in the last 168 hours. Coagulation Profile: No results for input(s): INR, PROTIME in the last 168 hours. Cardiac Enzymes: Recent Labs  Lab 05/07/18 2111 05/08/18 0634  TROPONINI <0.03 <0.03   BNP (last 3 results) No results for input(s): PROBNP in the last 8760 hours.  HbA1C: No results for input(s): HGBA1C in the last 72 hours. CBG: Recent Labs  Lab 05/11/18 0738 05/11/18 1202 05/11/18 1652 05/11/18 2106 05/12/18 0823  GLUCAP 128* 285* 138* 114* 137*   Lipid Profile: No results for input(s): CHOL, HDL, LDLCALC, TRIG, CHOLHDL, LDLDIRECT in the last 72 hours. Thyroid Function Tests: No results for input(s): TSH, T4TOTAL, FREET4, T3FREE, THYROIDAB in the last 72 hours. Anemia Panel: No results for input(s): VITAMINB12, FOLATE, FERRITIN, TIBC, IRON, RETICCTPCT in the last 72 hours. Urine analysis:    Component Value Date/Time   COLORURINE YELLOW 02/22/2018 2000   APPEARANCEUR CLEAR 02/22/2018 2000   LABSPEC 1.013 02/22/2018 2000   PHURINE 6.0 02/22/2018 2000   GLUCOSEU 50 (A) 02/22/2018 2000   HGBUR NEGATIVE 02/22/2018 2000   BILIRUBINUR NEGATIVE 02/22/2018 2000   KETONESUR NEGATIVE 02/22/2018 2000   PROTEINUR NEGATIVE  02/22/2018 2000   NITRITE NEGATIVE 02/22/2018 2000   LEUKOCYTESUR MODERATE (A) 02/22/2018 2000   Sepsis Labs: @LABRCNTIP (procalcitonin:4,lacticidven:4)  ) Recent Results (from the past 240 hour(s))  MRSA PCR Screening     Status: None   Collection Time: 05/08/18  3:23 AM  Result Value Ref Range Status   MRSA by PCR NEGATIVE NEGATIVE Final    Comment:        The GeneXpert MRSA Assay (FDA approved for NASAL specimens only), is one component of a comprehensive MRSA colonization surveillance program. It is not intended to diagnose MRSA infection nor to guide or monitor treatment for MRSA infections. Performed at [facility], [address], [place], [place] [address]       Studies: No results found.  Scheduled Meds: . amLODipine  5 mg Oral BID  . clopidogrel  75 mg Oral Daily  . dextromethorphan-guaiFENesin  1 tablet Oral BID  . enoxaparin (LOVENOX) injection  40 mg Subcutaneous Q24H  . feeding supplement (ENSURE ENLIVE)  237 mL Oral BID BM  . furosemide  10 mg Oral Daily  . insulin aspart  0-9 Units Subcutaneous TID WC  . isosorbide mononitrate  60 mg Oral Daily  . lisinopril  30 mg Oral Daily  . magnesium oxide  400 mg Oral QHS  . metoprolol succinate  100 mg Oral Daily  . multivitamin with minerals  1 tablet Oral Daily  . ranolazine  500 mg Oral BID  . triamterene-hydrochlorothiazide  1 tablet Oral Daily    Continuous Infusions: . sodium chloride Stopped (05/10/18 0335)  . ceFEPime (MAXIPIME) IV 1 g (05/12/18 0223)     LOS: 5 days     Laverna Peace, MD [facility] Pager [phone]  If 7PM-7AM, please contact night-coverage [email or web address] Password [email or web address] 05/12/2018, 10:15 AM

## 2018-05-12 NOTE — Progress Notes (Signed)
RN rounded on pt. Pt is asleep. 

## 2018-05-12 NOTE — Progress Notes (Signed)
Pt is asleep. RN will give pt her Ensure when she is awake.

## 2018-05-12 NOTE — Clinical Social Work Note (Signed)
CSW continues to follow for discharge needs. RN placed PT/OT consults for potential SNF.  Charlynn CourtSarah Zavian Slowey, CSW [phone]

## 2018-05-12 NOTE — Progress Notes (Signed)
Pt sitting up in bed. Pt states she does not want to get to bedside chair at this time. Pt's daughter-in-law at bedside.

## 2018-05-12 NOTE — Consult Note (Signed)
Name: Charlene Greene MRN: [id number] DOB: 09-03-1922    ADMISSION DATE:  05/07/2018 CONSULTATION DATE:  7/1  REFERRING MD :  Dr. Roberto ScalesShayla Nettey TR[facility]  CHIEF COMPLAINT:  Pneumonia   HISTORY OF PRESENT ILLNESS:  [age] year old female with PMH as below, which is significant for CAD, DM, sick sinus (PPM in place), and renal artery stenosis (s/p stent). She presented from SNF 6/30 with complaints of non-productive cough x 2 weeks. She was started on azithromycin and prednisone for these symptoms while at SNF on 6/29, but SOB worsened and she was brought to ED. She was admitted and treated with IV cefepime and vancomycin, however, she had slow improvement. PCCM consulted.   SIGNIFICANT EVENTS    STUDIES:  6/27 CTA chest> No CT evidence of pulmonary embolism. Consolidative changes of the majority of the right lower lobe concerning for postobstructive atelectasis versus pneumonia. Aspiration is not excluded. There is mucus secretion in the right lower lobe bronchus with associated luminal narrowing. Subsegmental left lung base atelectasis versus infiltrate. Cardiomegaly with coronary vascular calcification.   PAST MEDICAL HISTORY :   has a past medical history of CAD (coronary artery disease), Diabetes mellitus without complication (HCC), Hyperlipidemia, Hypertension, Pacemaker, Renal artery stenosis (HCC), Sick sinus syndrome (HCC), and Stroke (HCC).  has a past surgical history that includes pacemaker placement (05/15/2014). Prior to Admission medications   Medication Sig Start Date End Date Taking? Authorizing Provider  acetaminophen (TYLENOL) 500 MG tablet Take 500 mg by mouth 2 (two) times daily. Additional Order: Take 500mg  every 12 hours as needed for pain. NTE 2 tablets in 24 hours.   Yes [provider]  amLODipine (NORVASC) 5 MG tablet Take 1 tablet (5 mg total) by mouth 2 (two) times daily. 03/13/18  Yes Angiulli, Mcarthur Rossettianiel J, PA-C  azithromycin (ZITHROMAX) 250 MG tablet Take 250 mg by  mouth at bedtime. For 5 days 05/07/18 05/12/18 Yes [provider]  benzonatate (TESSALON) 100 MG capsule Take 100 mg by mouth daily. For 10 days 05/07/18 05/17/18 Yes [provider]  cholecalciferol (VITAMIN D) 1000 units tablet Take 1,000 Units by mouth daily.   Yes [provider]  clopidogrel (PLAVIX) 75 MG tablet Take 1 tablet (75 mg total) by mouth daily. 03/13/18  Yes Angiulli, Mcarthur Rossettianiel J, PA-C  furosemide (LASIX) 20 MG tablet Take 10 mg by mouth daily.   Yes [provider]  glimepiride (AMARYL) 1 MG tablet Take 1 tablet (1 mg total) by mouth daily with breakfast. 03/13/18  Yes Angiulli, Mcarthur Rossettianiel J, PA-C  Ipratropium-Albuterol (COMBIVENT) 20-100 MCG/ACT AERS respimat Inhale 2 puffs into the lungs 2 (two) times daily. For 2 weeks 05/06/18 05/20/18 Yes [provider]  isosorbide mononitrate (IMDUR) 60 MG 24 hr tablet Take 1 tablet (60 mg total) by mouth daily. 10/11/16  Yes Angiulli, Mcarthur Rossettianiel J, PA-C  lisinopril (PRINIVIL,ZESTRIL) 20 MG tablet Take 1 tablet (20 mg total) by mouth 2 (two) times daily. Patient taking differently: Take 30 mg by mouth daily.  03/13/18  Yes Angiulli, Mcarthur Rossettianiel J, PA-C  Magnesium 250 MG TABS Take 250 mg by mouth at bedtime.   Yes [provider]  metoprolol succinate (TOPROL-XL) 100 MG 24 hr tablet Take 1 tablet (100 mg total) by mouth daily. 03/13/18  Yes Angiulli, Mcarthur Rossettianiel J, PA-C  nitroGLYCERIN (NITROSTAT) 0.4 MG SL tablet Place 0.4 mg under the tongue every 5 (five) minutes as needed for chest pain (max 3 doses in 15 minutes).   Yes [provider]  predniSONE (DELTASONE) 5 MG tablet Take 5 mg by mouth once. Additional Orders: Take 10mg  once (05/06/18 @1300 ); Take 15mg  once (05/05/18 @1300 )   Yes [provider]  ranolazine (RANEXA) 500 MG 12 hr tablet Take 1 tablet (500 mg total) by mouth 2 (two) times daily. 10/11/16  Yes Angiulli, Mcarthur Rossetti, PA-C  senna (SENOKOT) 8.6 MG TABS tablet Take 1 tablet by mouth daily as  needed for mild constipation. NTE 8.6mg  in 24 hours   Yes [provider]  triamterene-hydrochlorothiazide (MAXZIDE-25) 37.5-25 MG tablet Take 1 tablet by mouth daily. Additional Order: Take 0.5 tablet by mouth on Monday, Wednesday, and Friday. 05/07/18 05/10/18 Yes [provider]  aspirin EC 81 MG EC tablet Take 1 tablet (81 mg total) by mouth daily. Patient not taking: Reported on 05/07/2018 02/27/18   Edsel Petrin, DO  busPIRone (BUSPAR) 5 MG tablet Take 1 tablet (5 mg total) by mouth 2 (two) times daily. Patient not taking: Reported on 05/07/2018 03/13/18   Angiulli, Mcarthur Rossetti, PA-C  ezetimibe (ZETIA) 10 MG tablet Take 1 tablet (10 mg total) by mouth daily. Patient not taking: Reported on 05/07/2018 03/13/18   Angiulli, Mcarthur Rossetti, PA-C  fenofibrate 160 MG tablet Take 1 tablet (160 mg total) by mouth daily. Patient not taking: Reported on 05/07/2018 10/11/16   Angiulli, Mcarthur Rossetti, PA-C   Allergies  Allergen Reactions  . Statins Other (See Comments)    Intol to numerous statins still feels has some muscle weakness, myalgia  . Other Other (See Comments)    Unknown reaction to dye (listed on [facility] 02/22/18)  . Amlodipine Other (See Comments)    unknown  . Celecoxib Nausea Only and Other (See Comments)  . Codeine Other (See Comments)    unknown  . Naproxen Other (See Comments)    unknown  . Piroxicam Other (See Comments)    unknown    FAMILY HISTORY:  family history includes Hypertension in her other; Lung cancer in her sister. SOCIAL HISTORY:  reports that she has never smoked. She has never used smokeless tobacco. She reports that she does not drink alcohol or use drugs.  Review of Systems:   Bolds are positive  Constitutional: weight loss, gain, night sweats, Fevers, chills, fatigue .  HEENT: headaches, Sore throat, sneezing, nasal congestion, post nasal drip, Difficulty swallowing, Tooth/dental problems, visual complaints visual changes, ear ache CV:  chest pain,  radiates:,Orthopnea, PND, swelling in lower extremities, dizziness, palpitations, syncope.  GI  heartburn, indigestion, abdominal pain, nausea, vomiting, diarrhea, change in bowel habits, loss of appetite, bloody stools.  Resp: cough, nonproductive: can't get anything up , hemoptysis, dyspnea, chest pain, pleuritic.  Skin: rash or itching or icterus GU: dysuria, change in color of urine, urgency or frequency. flank pain, hematuria  MS: joint pain or swelling. decreased range of motion  Psych: change in mood or affect. depression or anxiety.  Neuro: difficulty with speech, weakness, numbness, ataxia    SUBJECTIVE: Feeling better today.   VITAL SIGNS: Temp:  [97.5 F (36.4 C)-98 F (36.7 C)] 98 F (36.7 C) (07/01 0644) Pulse Rate:  [62-101] 101 (07/01 0644) Resp:  [17-18] 18 (07/01 0644) BP: (115-149)/(61-81) 146/81 (07/01 0644) SpO2:  [92 %-96 %] 93 % (07/01 0644) Weight:  [73 kg (160 lb 15 oz)] 73 kg (160 lb 15 oz) (07/01 0230)  PHYSICAL EXAMINATION: General:  Elderly female in NAD Neuro:  Alert, oriented, non-focal HEENT:  [place]/AT, PERRL, no JVD Cardiovascular:  RRR, no MRG. No edema.  Lungs:  Diminished bases. Non-labored.  Abdomen:  Soft, non-tender Musculoskeletal:  No acute deformity or ROM limitation Skin:  Grossly intact.   Recent Labs  Lab 05/10/18 0502 05/11/18 0700 05/12/18 0554  NA 133* 134* 130*  K 3.9 4.1 4.5  CL 93* 90* 88*  CO2 32 33* 31  BUN 19 21 27*  CREATININE 0.87 0.89 0.94  GLUCOSE 189* 134* 150*   Recent Labs  Lab 05/07/18 1926 05/08/18 0634 05/10/18 0502  HGB 13.3 12.4 13.0  HCT 41.9 38.4 40.5  WBC 11.5* 10.1 8.0  PLT 286 280 290   Dg Chest 2 View  Result Date: 05/11/2018 CLINICAL DATA:  History of pneumonia. EXAM: CHEST - 2 VIEW COMPARISON:  Chest radiograph 05/08/2018 FINDINGS: Multi lead pacer apparatus overlies the left hemithorax. Stable cardiomegaly. Aortic atherosclerosis. Small layering right pleural effusion with underlying  opacities favored represent atelectasis. Thoracic spine degenerative changes. IMPRESSION: Small layering right pleural effusion with underlying opacities favored to represent atelectasis. Cardiomegaly. Electronically Signed   By: Annia Belt M.D.   On: 05/11/2018 13:10    ASSESSMENT / PLAN:  Acute on chronic hypoxemic respiratory failure (on O2 at night PTA). Likely multifactorial in the setting of HCAP and pulmonary edema. Aspiration potentially playing a role based on SLP eval. Small right sided effusion as well. Seems to be improving with volume removal so will defer any diagnostic evaluation of pleural fluid. She feels better today and seems to be slowly responding to treatment. CT negative for PE.  - Continue antibiotics per primary team - Wean FiO2 to keep O2 sat > 90%. Sats 97% on 5L. RT asked to work on this throughout the day.  - Incentive spirometry and flutter valve.  - discontinue scheduled duoneb, may help mucous clearance. Continue mucinex.  - dysphagia diet per SLP - Diuresis as tolerated. 3L negative for the admission.   Joneen Roach, AGACNP-BC [facility] Pulmonology/Critical Care Pager [phone] or [phone]  05/12/2018 9:33 AM

## 2018-05-13 LAB — GLUCOSE, CAPILLARY
GLUCOSE-CAPILLARY: 139 mg/dL — AB (ref 70–99)
GLUCOSE-CAPILLARY: 178 mg/dL — AB (ref 70–99)
Glucose-Capillary: 148 mg/dL — ABNORMAL HIGH (ref 70–99)
Glucose-Capillary: 121 mg/dL — ABNORMAL HIGH (ref 70–99)

## 2018-05-13 LAB — BASIC METABOLIC PANEL
ANION GAP: 6 (ref 5–15)
BUN: 32 mg/dL — AB (ref 8–23)
CALCIUM: 8.3 mg/dL — AB (ref 8.9–10.3)
CO2: 37 mmol/L — AB (ref 22–32)
CREATININE: 1.06 mg/dL — AB (ref 0.44–1.00)
Chloride: 90 mmol/L — ABNORMAL LOW (ref 98–111)
GFR calc non Af Amer: 44 mL/min — ABNORMAL LOW (ref >60)
GFR, EST AFRICAN AMERICAN: 51 mL/min — AB (ref >60)
Glucose, Bld: 133 mg/dL — ABNORMAL HIGH (ref 70–99)
Potassium: 4.8 mmol/L (ref 3.5–5.1)
Sodium: 133 mmol/L — ABNORMAL LOW (ref 135–145)

## 2018-05-13 LAB — CBC
HCT: 42 % (ref 36.0–46.0)
Hemoglobin: 13.4 g/dL (ref 12.0–15.0)
MCH: 28.5 pg (ref 26.0–34.0)
MCHC: 31.9 g/dL (ref 30.0–36.0)
MCV: 89.2 fL (ref 78.0–100.0)
PLATELETS: 288 10*3/uL (ref 150–400)
RBC: 4.71 MIL/uL (ref 3.87–5.11)
RDW: 13.2 % (ref 11.5–15.5)
WBC: 8.2 10*3/uL (ref 4.0–10.5)

## 2018-05-13 MED ORDER — AMOXICILLIN-POT CLAVULANATE 875-125 MG PO TABS: 1.0000 | ORAL_TABLET | Freq: Two times a day (BID) | ORAL | 0 refills | Status: DC

## 2018-05-13 MED ORDER — DM-GUAIFENESIN ER 30-600 MG PO TB12: 1.0000 | ORAL_TABLET | Freq: Two times a day (BID) | ORAL | Status: DC

## 2018-05-13 MED ORDER — FUROSEMIDE 20 MG PO TABS: 40.0000 mg | ORAL_TABLET | Freq: Every day | ORAL | Status: DC

## 2018-05-13 MED ORDER — ENSURE ENLIVE PO LIQD: 237.0000 mL | Freq: Two times a day (BID) | ORAL | 12 refills | Status: AC

## 2018-05-13 MED ORDER — ENOXAPARIN SODIUM 30 MG/0.3ML ~~LOC~~ SOLN: 30.0000 mg | SUBCUTANEOUS | Status: DC

## 2018-05-13 MED ORDER — AMOXICILLIN-POT CLAVULANATE 875-125 MG PO TABS: 1.0000 | ORAL_TABLET | Freq: Two times a day (BID) | ORAL | 0 refills | Status: AC

## 2018-05-13 MED ORDER — ADULT MULTIVITAMIN W/MINERALS CH: 1.0000 | ORAL_TABLET | Freq: Every day | ORAL | Status: AC

## 2018-05-13 NOTE — Evaluation (Signed)
Physical Therapy Evaluation Patient Details Name: Charlene Greene MRN: [id number] DOB: Oct 31, 1922 Today's Date: 05/13/2018   History of Present Illness  Charlene Greene is a 84yo female [facility] d/t 2W SOB and coughing. PMH: sick sinus syndrome s/p PPM, HTN, DM2, CVAx2  (2017, 2019), CHF. At baesline [facility] [place] with [phone], and is dependent in transport chair to get around the facility. Pt reports O2 use at night.   Clinical Impression  Pt admitted with above diagnosis. Pt currently with functional limitations due to the deficits listed below (see "PT Problem List"). Upon entry, pt in bed, no family/caregiver present. The pt is awake and agreeable to participate, although she is somnolent, falling asleep multiple times during questioning. Pt requires minA physical assistance for bed mobility and transfers, and minGuard assist for short AMB distances with RW in room. Pt report significan weakness compared to her baseline level of function. Functional mobility assessment demonstrates increased effort/time requirements, poor tolerance, and need for physical assistance, whereas the patient performed these at a higher level of independence PTA. Pt will benefit from skilled PT intervention to increase independence and safety with basic mobility in preparation for discharge to the venue listed below.       Follow Up Recommendations Home health PT;Supervision for mobility/OOB([facility] )    Equipment Recommendations  None recommended by PT    Recommendations for Other Services       Precautions / Restrictions Precautions Precautions: Fall Restrictions Weight Bearing Restrictions: No      Mobility  Bed Mobility Overal bed mobility: Needs Assistance Bed Mobility: Supine to Sit     Supine to sit: Min assist     General bed mobility comments: weaker than baseline, RUE assistance ot allow pt to pull forward.    Transfers Overall transfer level: Needs assistance Equipment used: Rolling walker (2 wheeled) Transfers: Sit to/from Stand Sit to Stand: Mod assist         General transfer comment: lift assist at gait belt and Rt hand, typically requires no assistance   Ambulation/Gait Ambulation/Gait assistance: Min guard Gait Distance (Feet): 10 Feet Assistive device: Rolling walker (2 wheeled)       General Gait Details: slow and weak, limited hip flexoin strength in swing phase with poor foot clearance. VC to use RW closer to body than her typical 4WW.   Stairs            Wheelchair Mobility    Modified Rankin (Stroke Patients Only)       Balance Overall balance assessment: Mild deficits observed, not formally tested(heavily dependent on RW to maintain balance, reports fatgiue in BUE halfway to chair. )                                           Pertinent Vitals/Pain Pain Assessment: No/denies pain    Home Living Family/patient expects to be discharged to:: Assisted living([facility] since Feb 2018 )               Home Equipment: Dan HumphreysWalker - 4 wheels;Wheelchair - manual Additional Comments: Lives in assisted living facility with assistance for bathing and dressing per pt report. Makes small snacks for herself but has meals provided. Transported dependently in w/c to community activities    Prior Function Level of Independence: Needs  assistance         Comments: RW in room; Assistance for facility mobility in transport chair; assistance with bathing, dressing, toiletting.      Hand Dominance   Dominant Hand: Right    Extremity/Trunk Assessment   Upper Extremity Assessment Upper Extremity Assessment: Generalized weakness    Lower Extremity Assessment Lower Extremity Assessment: Generalized weakness       Communication      Cognition Arousal/Alertness: Lethargic Behavior During Therapy: WFL for tasks  assessed/performed Overall Cognitive Status: Within Functional Limits for tasks assessed                                        General Comments      Exercises     Assessment/Plan    PT Assessment Patient needs continued PT services  PT Problem List Cardiopulmonary status limiting activity;Decreased strength;Decreased balance;Decreased activity tolerance;Decreased range of motion       PT Treatment Interventions Functional mobility training;Therapeutic activities;Therapeutic exercise;Patient/family education;Gait training;Balance training;DME instruction    PT Goals (Current goals can be found in the Care Plan section)  Acute Rehab PT Goals Patient Stated Goal: regain leg and arm strength  PT Goal Formulation: With patient Time For Goal Achievement: 05/27/18 Potential to Achieve Goals: Good    Frequency Min 2X/week   Barriers to discharge        Co-evaluation               AM-PAC PT "6 Clicks" Daily Activity  Outcome Measure Difficulty turning over in bed (including adjusting bedclothes, sheets and blankets)?: A Lot Difficulty moving from lying on back to sitting on the side of the bed? : Unable Difficulty sitting down on and standing up from a chair with arms (e.g., wheelchair, bedside commode, etc,.)?: Unable Help needed moving to and from a bed to chair (including a wheelchair)?: Total Help needed walking in hospital room?: A Lot Help needed climbing 3-5 steps with a railing? : A Lot 6 Click Score: 9    End of Session Equipment Utilized During Treatment: Gait belt;Oxygen Activity Tolerance: Patient limited by fatigue;Patient limited by lethargy;Patient tolerated treatment well Patient left: in chair;with call bell/phone within reach;with chair alarm set Nurse Communication: Mobility status PT Visit Diagnosis: Unsteadiness on feet (R26.81);Difficulty in walking, not elsewhere classified (R26.2);Muscle weakness (generalized) (M62.81)     Time: 1610-9604 PT Time Calculation (min) (ACUTE ONLY): 33 min   Charges:   PT Evaluation $PT Eval Moderate Complexity: 1 Mod PT Treatments $Therapeutic Activity: 8-22 mins   PT G Codes:        10:21 AM, 06-07-18 Rosamaria Lints, PT, DPT Physical Therapist - [place] [phone] (Pager)  [phone] (Office)     Buccola,Allan C 2018-06-07, 10:20 AM

## 2018-05-13 NOTE — Progress Notes (Signed)
Per NP K.Kirby patient needs to be placed back on telemetry until d/c and she does not need IV overnight.  Rakel Junio, RN

## 2018-05-13 NOTE — Progress Notes (Addendum)
During the shift change patient was still on [place] 1l/min. Since patient has order since this morning to be Weaned to RA, and patient and family stated that she normally wears 2 l O2 while asleep/ at bedtime and family was on the way to pick her up nasal cannula was removed and patient was weaned to RA. Patient saturation was stable but after 10 minutes dropped to 84.[place] was placed back.   When family arrived it was explained by primary nurse and Charge nurse that is unsafe to travel without O2 and we should wait on [facility].  Daughter in law BruslyDenise and Chatm[place]ry son at bedside stated that they are refusing patient to be transferred to facility this late since we where still waiting on transport.  Paged NP on call K.Kirby to address family concerns. Per NP patient will stay over night and she will be discharged in the morning.Family informed and agreeable. Charge Nurse informed as well.   Carleah Yablonski, RN

## 2018-05-13 NOTE — Progress Notes (Addendum)
Family is requesting to come and pick up the patient because they are "tired of waiting" on PTAR. Floor secretary called [facility], patient is 3rd on the list, they will be here in approximately 2hrs. Family was informed and they informed me that they are on the way to come and pick up the patient because this is past her bedtime and they will transport her.     Day shift RN tried to called report to [facility], no answer, voice mail was left.  Report was called again by night shift RN and I was informed that ALF RN already left at 5 pm and there is nobody to come and take report at this point but they are aware that patient is coming back.  Lillyrose Reitan, RN

## 2018-05-13 NOTE — Progress Notes (Signed)
Attempted report to [facility] but unable to speak with nurse. Left msg at Nsg office with number for them to return call for report.

## 2018-05-13 NOTE — Discharge Summary (Addendum)
Discharge Summary  Charlene Greene OZH:[id number] DOB: 05/24/1922  PCP: [facility]  Admit date: 05/07/2018 Discharge date: 05/14/2018 (edited discharge date to 05/14/2018) The rest of the discharge summary dictated by Dr. Hyman Bower.    Time spent: < 25 minutes  Admitted From: [facility], [facility] Disposition: ALF, [facility]  Recommendations for Outpatient Follow-up:  1. Follow up with PCP in 1 to 2 weeks 2. Continue Augmentin, end date 05/17/2018 3.  4.     Discharge Diagnoses:  Active Hospital Problems   Diagnosis Date Noted  . HCAP (healthcare-associated pneumonia) 05/08/2018  . Pressure injury of skin 05/12/2018  . Acute respiratory failure with hypoxia (HCC) 05/07/2018  . PAD (peripheral artery disease) (HCC)   . Diabetes mellitus type 2 in nonobese (HCC)   . Chronic diastolic congestive heart failure (HCC)   . Left thalamic infarction (HCC) 02/26/2018  . Type 2 diabetes mellitus with peripheral neuropathy (HCC)   . Stage 3 chronic kidney disease [facility])     Resolved Hospital Problems  No resolved problems to display.    Discharge Condition: Stable  CODE STATUS: DNR Diet recommendation:    Vitals:   05/13/18 1000 05/13/18 1009  BP:    Pulse: 72   Resp:    Temp:    SpO2: (!) 85% 93%    History of present illness:  Charlene Greene is a 84 y.o. year old female with medical history significant for CVA (02/2018), sick sinus syndrome status post pacemaker placement, renal artery stenosis status post stent, CAD, diabetes mellitus type 2, hypertension who presented on 05/07/2018 from [facility] worsening nonproductive cough shortness of breath for 2 weeks and was found to have acute on chronic hypoxic respiratory failure presumed secondary to right lower lobe pneumonia (aspiration?/post obstructive) and likely CHF exacerbation.  She was seen by her PCP and given azithromycin and prednisone which she started the day prior to presentation at her  nursing facility.  She was still notably short of breath there and was brought to ED for further evaluation.    Hospital Course:  In the ED, patient was empirically treated with IV vancomycin and cefepime given concern for post obstructive pneumonia/atelectasis on initial admitting CTA open (which would out PE) disease.  Patient initially required 5 L of oxygen and had minimal improvement on empiric antibiotics.  Patient was continued on IV Lasix given repeat chest x-ray showed element of mild pulmonary edema.  Pulmonary was consulted who agreed with IV diuresis and antibiotics.  Remaining course addressed in problem based format below;  Acute on chronic hypoxic respiratory failure, likely multifactorial etiology (CHF exacerbation, CAP).  Patient's admitting chest x-ray was concerning for mild pulmonary edema consistent with likely CHF exacerbation.  Patient responded well to IV Lasix then able to transition to oral Lasix 40 mg daily.  Initial concern for possible postobstructive pneumonia on CTA chest however repeat chest x-ray showed improvement in opacities.  Pulmonary was consulted and agreed with antibiotics, scheduled duo nebs at the time and supportive care with flutter valve, incentivw spirometry.  Was able to de-escalate to 1.5 L nasal cannula oxygen off scheduled inhaler therapy.  Okay to resume prior home inhaler regimen.  Of note strep pneumo and Legionella both negative.  Expect patient to continue to have chronic cough for the next 4 to 6 weeks.  Will prescribe Mucinex and continue Augmentin with end date of 05/17/2018.  CAP  Initially required up to 5 L nasal cannula.  Chest x-ray concerning for opacities.  CTA chest initially concerning for progression of pneumonia.  However repeat chest x-ray showed resolution of the concern.  As mentioned above pulmonary agreed with IV antibiotics.  Was able to transition to Augmentin with continued improvement in oxygen requirements.  On discharge O2 [place] 1.5  L needed.  Will finish Augmentin, end date 05/17/18.  Acute on chronic diastolic CHF exacerbation.  Elevated BNP on admission greater than 400, last TTE showed diastolic dysfunction.  Admitting chest x-ray with small  pleural effusions, net -3 L with IV Lasix with good output on transition to oral Lasix.  Should continue Lasix 40 mg daily on discharge.  Closely monitor weights and blood pressure.   Hypertension, at goal.  Continue home Norvasc, lisinopril, Imdur, Maxide and Toprol    Consultations:  Pulmonary  Procedures/Studies: None  Discharge Exam: BP 140/66   Pulse 72   Temp 97.7 F (36.5 C)   Resp 15   Ht 5' (1.524 m)   Wt 73.3 kg (161 lb 9.6 oz)   SpO2 93%   BMI 31.56 kg/m   Constitutional:normal appearing female, in no distress Eyes: EOMI, anicteric, normal conjunctivae ENMT: Oropharynx with moist mucous membranes, normal dentition Cardiovascular: RRR no MRGs, with no peripheral edema Respiratory: Normal respiratory effort on 1.5 L nasal cannula, clear breath sounds throughout, intermittent rhonchi at bases  Abdomen: Soft,non-tender,  Skin: No rash ulcers, or lesions. Without skin tenting  Neurologic: Grossly no focal neuro deficit. Psychiatric:Appropriate affect, and mood. Mental status AAOx3       Discharge Instructions You were cared for by a hospitalist during your hospital stay. If you have any questions about your discharge medications or the care you received while you were in the hospital after you are discharged, you can call the unit and asked to speak with the hospitalist on call if the hospitalist that took care of you is not available. Once you are discharged, your primary care physician will handle any further medical issues. Please note that NO REFILLS for any discharge medications will be authorized once you are discharged, as it is imperative that you return to your primary care physician (or establish a relationship with a primary care physician if  you do not have one) for your aftercare needs so that they can reassess your need for medications and monitor your lab values.   Allergies as of 05/13/2018      Reactions   Statins Other (See Comments)   Intol to numerous statins still feels has some muscle weakness, myalgia   Other Other (See Comments)   Unknown reaction to dye (listed on [facility] 02/22/18)   Amlodipine Other (See Comments)   unknown   Celecoxib Nausea Only, Other (See Comments)   Codeine Other (See Comments)   unknown   Naproxen Other (See Comments)   unknown   Piroxicam Other (See Comments)   unknown      Medication List    STOP taking these medications   aspirin 81 MG EC tablet   azithromycin 250 MG tablet Commonly known as:  ZITHROMAX   busPIRone 5 MG tablet Commonly known as:  BUSPAR   ezetimibe 10 MG tablet Commonly known as:  ZETIA   fenofibrate 160 MG tablet   predniSONE 5 MG tablet Commonly known as:  DELTASONE     TAKE these medications   acetaminophen 500 MG tablet Commonly known as:  TYLENOL Take 500 mg by mouth 2 (two) times daily. Additional Order: Take 500mg  every 12 hours as needed for pain. NTE  2 tablets in 24 hours.   amLODipine 5 MG tablet Commonly known as:  NORVASC Take 1 tablet (5 mg total) by mouth 2 (two) times daily.   amoxicillin-clavulanate 875-125 MG tablet Commonly known as:  AUGMENTIN Take 1 tablet by mouth every 12 (twelve) hours for 7 doses.   benzonatate 100 MG capsule Commonly known as:  TESSALON Take 100 mg by mouth daily. For 10 days   cholecalciferol 1000 units tablet Commonly known as:  VITAMIN D Take 1,000 Units by mouth daily.   clopidogrel 75 MG tablet Commonly known as:  PLAVIX Take 1 tablet (75 mg total) by mouth daily.   dextromethorphan-guaiFENesin 30-600 MG 12hr tablet Commonly known as:  MUCINEX DM Take 1 tablet by mouth 2 (two) times daily. What changed:  additional instructions   feeding supplement (ENSURE ENLIVE) Liqd Take 237 mLs by  mouth 2 (two) times daily between meals. Start taking on:  05/14/2018   furosemide 20 MG tablet Commonly known as:  LASIX Take 2 tablets (40 mg total) by mouth daily. What changed:  how much to take   glimepiride 1 MG tablet Commonly known as:  AMARYL Take 1 tablet (1 mg total) by mouth daily with breakfast.   Ipratropium-Albuterol 20-100 MCG/ACT Aers respimat Commonly known as:  COMBIVENT Inhale 2 puffs into the lungs 2 (two) times daily. For 2 weeks   isosorbide mononitrate 60 MG 24 hr tablet Commonly known as:  IMDUR Take 1 tablet (60 mg total) by mouth daily.   lisinopril 20 MG tablet Commonly known as:  PRINIVIL,ZESTRIL Take 1 tablet (20 mg total) by mouth 2 (two) times daily. What changed:    how much to take  when to take this   Magnesium 250 MG Tabs Take 250 mg by mouth at bedtime.   metoprolol succinate 100 MG 24 hr tablet Commonly known as:  TOPROL-XL Take 1 tablet (100 mg total) by mouth daily.   multivitamin with minerals Tabs tablet Take 1 tablet by mouth daily. Start taking on:  05/14/2018   nitroGLYCERIN 0.4 MG SL tablet Commonly known as:  NITROSTAT Place 0.4 mg under the tongue every 5 (five) minutes as needed for chest pain (max 3 doses in 15 minutes).   ranolazine 500 MG 12 hr tablet Commonly known as:  RANEXA Take 1 tablet (500 mg total) by mouth 2 (two) times daily.   senna 8.6 MG Tabs tablet Commonly known as:  SENOKOT Take 1 tablet by mouth daily as needed for mild constipation. NTE 8.6mg  in 24 hours   triamterene-hydrochlorothiazide 37.5-25 MG tablet Commonly known as:  MAXZIDE-25 Take 1 tablet by mouth daily. Additional Order: Take 0.5 tablet by mouth on Monday, Wednesday, and Friday.      Allergies  Allergen Reactions  . Statins Other (See Comments)    Intol to numerous statins still feels has some muscle weakness, myalgia  . Other Other (See Comments)    Unknown reaction to dye (listed on [facility] 02/22/18)  . Amlodipine Other (See  Comments)    unknown  . Celecoxib Nausea Only and Other (See Comments)  . Codeine Other (See Comments)    unknown  . Naproxen Other (See Comments)    unknown  . Piroxicam Other (See Comments)    unknown      The results of significant diagnostics from this hospitalization (including imaging, microbiology, ancillary and laboratory) are listed below for reference.    Significant Diagnostic Studies: Dg Chest 2 View  Result Date: 05/11/2018 CLINICAL DATA:  History of pneumonia. EXAM: CHEST - 2 VIEW COMPARISON:  Chest radiograph 05/08/2018 FINDINGS: Multi lead pacer apparatus overlies the left hemithorax. Stable cardiomegaly. Aortic atherosclerosis. Small layering right pleural effusion with underlying opacities favored represent atelectasis. Thoracic spine degenerative changes. IMPRESSION: Small layering right pleural effusion with underlying opacities favored to represent atelectasis. Cardiomegaly. Electronically Signed   By: Annia Belt M.D.   On: 05/11/2018 13:10   Dg Chest 2 View  Result Date: 05/07/2018 CLINICAL DATA:  Shortness of breath for the past 2 weeks. Generalized weakness. EXAM: CHEST - 2 VIEW COMPARISON:  02/25/2018; 02/22/2018; 09/28/2016 09/23/2016 FINDINGS: Examination is degraded due to patient body habitus and decreased lung volumes. Grossly unchanged enlarged cardiac silhouette and mediastinal contours with atherosclerotic plaque within the thoracic aorta. Stable position of support apparatus. Veiling opacities overlying the bilateral lower lungs are favored to represent overlying soft tissues and worsening bibasilar atelectasis. No discrete focal airspace opacities. No pleural effusion or pneumothorax. No definite evidence of edema. No acute osseous abnormalities. Moderate to severe degenerative change of the bilateral glenohumeral joints, incompletely evaluated. IMPRESSION: Bibasilar atelectasis without definitive superimposed acute cardiopulmonary disease on this  hypoventilated body habitus degraded examination. Electronically Signed   By: Simonne Come M.D.   On: 05/07/2018 20:04   Ct Angio Chest Pe W/cm &/or Wo Cm  Result Date: 05/08/2018 CLINICAL DATA:  84 year old female with shortness of breath. EXAM: CT ANGIOGRAPHY CHEST WITH CONTRAST TECHNIQUE: Multidetector CT imaging of the chest was performed using the standard protocol during bolus administration of intravenous contrast. Multiplanar CT image reconstructions and MIPs were obtained to evaluate the vascular anatomy. CONTRAST:  51mL ISOVUE-370 IOPAMIDOL (ISOVUE-370) INJECTION 76% COMPARISON:  Chest radiograph dated 05/07/2018 FINDINGS: Cardiovascular: There is mild cardiomegaly. Multi vessel coronary vascular calcification. No pericardial effusion. There is a left pectoral pacemaker device. There is moderate atherosclerotic calcification of thoracic aorta. No aneurysmal dilatation or evidence of dissection. In the there is no CT evidence of pulmonary embolism. Mediastinum/Nodes: There is no hilar or mediastinal adenopathy. There is a small hiatal hernia. The esophagus is grossly unremarkable. No mediastinal fluid collection. Lungs/Pleura: There is a small to moderate right pleural effusion. There is consolidative changes of the region of the of the right lower lobe. Endobronchial fluid collection is noted in the right lower lobe bronchus left lung base subsegmental atelectasis/scarring versus infiltrate. There is no pneumothorax. 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 Upper Abdomen: Atrophic pancreas. The visualized upper abdomen is otherwise unremarkable. Musculoskeletal: Degenerative changes of the spine. No acute osseous pathology. Rounded ossific densities in the region of the shoulder may represent PVNS, synovial osteochondromatosis, or less likely intra-articular loose bodies. Review of the MIP images confirms the above findings. IMPRESSION: 1. No CT evidence of pulmonary embolism. 2. Consolidative changes of the majority  of the right lower lobe concerning for postobstructive atelectasis versus pneumonia. Aspiration is not excluded. There is mucus secretion in the right lower lobe bronchus with associated luminal narrowing. Subsegmental left lung base atelectasis versus infiltrate. 3. Cardiomegaly with coronary vascular calcification. 4.  Aortic Atherosclerosis (ICD10-I70.0). Electronically Signed   By: Elgie Collard M.D.   On: 05/08/2018 00:17   Dg Chest Port 1 View  Result Date: 05/08/2018 CLINICAL DATA:  Dyspnea EXAM: PORTABLE CHEST 1 VIEW COMPARISON:  05/07/2018 chest radiograph. FINDINGS: Stable 2 lead left subclavian pacemaker. Stable cardiomediastinal silhouette with mild cardiomegaly. No pneumothorax. Small right pleural effusion, slightly increased. Stable trace left pleural effusion. Increased hazy right parahilar and right basilar lung opacity. Stable mild left basilar  atelectasis. IMPRESSION: 1. Mild cardiomegaly. Increased hazy right parahilar/basilar lung opacity, favor asymmetric mild pulmonary edema. 2. Small right pleural effusion, slightly increased. Stable trace left pleural effusion. Electronically Signed   By: Delbert Phenix M.D.   On: 05/08/2018 22:01   Dg Swallowing Func-speech Pathology  Result Date: 05/08/2018 Objective Swallowing Evaluation: Type of Study: MBS-Modified Barium Swallow Study  Patient Details Name: Neesha Langton MRN: [id number] Date of Birth: May 27, 1922 Today's Date: 05/08/2018 Time: SLP Start Time (ACUTE ONLY): 1300 -SLP Stop Time (ACUTE ONLY): 1325 SLP Time Calculation (min) (ACUTE ONLY): 25 min Past Medical History: Past Medical History: Diagnosis Date . CAD (coronary artery disease)  . Diabetes mellitus without complication (HCC)  . Hyperlipidemia  . Hypertension  . Pacemaker  . Renal artery stenosis (HCC)   s/p bilateral stents . Sick sinus syndrome (HCC)  . Stroke [facility])   2 previous cva's Past Surgical History: Past Surgical History: Procedure Laterality Date . PACEMAKER PLACEMENT   05/15/2014  MDT Advisa MRI implanted at [facility] for sick sinus syndrome HPI: Pt is a 84 y.o. female PMH recent stroke in April 2019, sick sinus syndrome status post pacemaker placement, renal artery stenosis status post stent, CAD, diabetes mellitus type 2, hypertension presents to the ER after patient has benign persistent cough with shortness of breath for the last 2 weeks.  Patient states she gets short of breath on coughing.  Denies any chest pain.  Has been having productive sputum. CXR showed "Bibasilar atelectasis without definitive superimposed acute cardiopulmonary disease on this hypoventilated body habitus degraded examination." Chest CT showed Consolidative changes of the majority of the right lower lobe concerning for postobstructive atelectasis versus pneumonia. Aspiration is not excluded. There is mucus secretion in the right lower lobe bronchus with associated luminal narrowing. Subsegmental left lung base atelectasis versus infiltrate." Pt had MBS on 02/25/18 and again on 03/05/18. On 4/16 Pt had aspiration of thin liquids and penetration of nectar thick liquids (oral control/ premature spill); recommendation for dysphagia 3/ nectar thick. On 4/24 repeat MBS pt showed only flash penetration and advanced to thin liquids. Upon discharge from inpatient rehab pt was on a regular/ thin liquid diet.   Subjective: pt pleasant, cooperative Assessment / Plan / Recommendation CHL IP CLINICAL IMPRESSIONS 05/08/2018 Clinical Impression Pt currently with a mild oropharyngeal and suspect esophageal phase dysphagia. Oropharyngeal phase characterized by occasional tongue pumping and piecemeal swallowing, delay in swallow initiation to the level of the pyriform sinuses and mildly reduced hyolaryngeal excursion; pt had penetration to the level of the vocal folds; penetration was not captured on video but noted later lining of the VFs- this could represent penetration of post-swallow residuals but pt did sense and cough  to effectively clear and was an isolated occurrence. Mild residuals noted across consistencies which were cleared with 2nd swallow. Barium tablet became lodged in vallecula and was cleared with follow-up puree bolus. Esophageal sweep revealed stasis of barium material to the mid-upper level of the esophagus. For esophageal efficiency, recommend dysphagia 3 diet/ thin liquids, meds whole in puree, reflux precautions- sit upright 30-60 minutes after meal, small bites/ sips, alternate food with liquid. Pt may benefit from further esophageal assessment. Will continue to follow for diet tolerance/ compensatory strategy training. SLP Visit Diagnosis Dysphagia, oropharyngeal phase (R13.12) Attention and concentration deficit following -- Frontal lobe and executive function deficit following -- Impact on safety and function Mild aspiration risk   CHL IP TREATMENT RECOMMENDATION 05/08/2018 Treatment Recommendations Therapy as outlined in treatment plan below  Prognosis 05/08/2018 Prognosis for Safe Diet Advancement Fair Barriers to Reach Goals (No Data) Barriers/Prognosis Comment -- CHL IP DIET RECOMMENDATION 05/08/2018 SLP Diet Recommendations Dysphagia 3 (Mech soft) solids;Thin liquid Liquid Administration via Cup;Straw Medication Administration Whole meds with puree Compensations Slow rate;Small sips/bites;Follow solids with liquid Postural Changes Remain semi-upright after after feeds/meals (Comment);Seated upright at 90 degrees   CHL IP OTHER RECOMMENDATIONS 05/08/2018 Recommended Consults (No Data) Oral Care Recommendations Oral care BID Other Recommendations --   CHL IP FOLLOW UP RECOMMENDATIONS 05/08/2018 Follow up Recommendations Other (comment)   CHL IP FREQUENCY AND DURATION 05/08/2018 Speech Therapy Frequency (ACUTE ONLY) min 2x/week Treatment Duration 1 week      CHL IP ORAL PHASE 05/08/2018 Oral Phase Impaired Oral - Pudding Teaspoon -- Oral - Pudding Cup -- Oral - Honey Teaspoon -- Oral - Honey Cup -- Oral - Nectar  Teaspoon -- Oral - Nectar Cup -- Oral - Nectar Straw -- Oral - Thin Teaspoon -- Oral - Thin Cup Lingual/palatal residue;Piecemeal swallowing;Premature spillage Oral - Thin Straw Piecemeal swallowing;Premature spillage Oral - Puree Lingual pumping;Piecemeal swallowing Oral - Mech Soft -- Oral - Regular Lingual pumping;Piecemeal swallowing Oral - Multi-Consistency -- Oral - Pill WFL Oral Phase - Comment --  CHL IP PHARYNGEAL PHASE 05/08/2018 Pharyngeal Phase Impaired Pharyngeal- Pudding Teaspoon -- Pharyngeal -- Pharyngeal- Pudding Cup -- Pharyngeal -- Pharyngeal- Honey Teaspoon -- Pharyngeal -- Pharyngeal- Honey Cup -- Pharyngeal -- Pharyngeal- Nectar Teaspoon -- Pharyngeal -- Pharyngeal- Nectar Cup -- Pharyngeal -- Pharyngeal- Nectar Straw -- Pharyngeal -- Pharyngeal- Thin Teaspoon -- Pharyngeal -- Pharyngeal- Thin Cup Delayed swallow initiation-pyriform sinuses;Penetration/Apiration after swallow;Pharyngeal residue - valleculae Pharyngeal Material enters airway, CONTACTS cords and then ejected out Pharyngeal- Thin Straw Delayed swallow initiation-pyriform sinuses;Penetration/Aspiration before swallow Pharyngeal Material enters airway, remains ABOVE vocal cords then ejected out Pharyngeal- Puree Pharyngeal residue - valleculae Pharyngeal -- Pharyngeal- Mechanical Soft -- Pharyngeal -- Pharyngeal- Regular Pharyngeal residue - valleculae Pharyngeal -- Pharyngeal- Multi-consistency -- Pharyngeal -- Pharyngeal- Pill Pharyngeal residue - valleculae Pharyngeal -- Pharyngeal Comment --  CHL IP CERVICAL ESOPHAGEAL PHASE 05/08/2018 Cervical Esophageal Phase Impaired Pudding Teaspoon -- Pudding Cup -- Honey Teaspoon -- Honey Cup -- Nectar Teaspoon -- Nectar Cup -- Nectar Straw -- Thin Teaspoon -- Thin Cup -- Thin Straw -- Puree -- Mechanical Soft -- Regular -- Multi-consistency -- Pill -- Cervical Esophageal Comment (No Data) No flowsheet data found. Amy Cecille AverK Oleksiak, MA, CCC-SLP 05/08/2018, 3:33 PM [phone]                Microbiology: Recent Results (from the past 240 hour(s))  MRSA PCR Screening     Status: None   Collection Time: 05/08/18  3:23 AM  Result Value Ref Range Status   MRSA by PCR NEGATIVE NEGATIVE Final    Comment:        The GeneXpert MRSA Assay (FDA approved for NASAL specimens only), is one component of a comprehensive MRSA colonization surveillance program. It is not intended to diagnose MRSA infection nor to guide or monitor treatment for MRSA infections. Performed at [facility], [address] [place], [place] [address]      Labs: Basic Metabolic Panel: Recent Labs  Lab 05/09/18 0909 05/10/18 0502 05/11/18 0700 05/12/18 0554 05/13/18 0457  NA 138 133* 134* 130* 133*  K 3.9 3.9 4.1 4.5 4.8  CL 95* 93* 90* 88* 90*  CO2 32 32 33* 31 37*  GLUCOSE 144* 189* 134* 150* 133*  BUN 12 19 21  27* 32*  CREATININE 0.85 0.87 0.89 0.94 1.06*  CALCIUM 8.6* 8.2* 8.4* 8.3* 8.3*   Liver Function Tests: No results for input(s): AST, ALT, ALKPHOS, BILITOT, PROT, ALBUMIN in the last 168 hours. No results for input(s): LIPASE, AMYLASE in the last 168 hours. No results for input(s): AMMONIA in the last 168 hours. CBC: Recent Labs  Lab 05/07/18 1926 05/08/18 0634 05/10/18 0502 05/13/18 0457  WBC 11.5* 10.1 8.0 8.2  NEUTROABS 9.9*  --   --   --   HGB 13.3 12.4 13.0 13.4  HCT 41.9 38.4 40.5 42.0  MCV 90.3 89.1 87.9 89.2  PLT 286 280 290 288   Cardiac Enzymes: Recent Labs  Lab 05/07/18 2111 05/08/18 0634  TROPONINI <0.03 <0.03   BNP: BNP (last 3 results) Recent Labs    02/22/18 1318 05/07/18 1926 05/10/18 0502  BNP 92.2 477.3* 264.0*    ProBNP (last 3 results) No results for input(s): PROBNP in the last 8760 hours.  CBG: Recent Labs  Lab 05/12/18 1206 05/12/18 1607 05/12/18 2124 05/13/18 0729 05/13/18 1146  GLUCAP 280* 182* 140* 139* 148*       Signed:  Laverna Peace, MD [facility] 05/13/2018, 2:54 PM

## 2018-05-13 NOTE — Clinical Social Work Note (Signed)
CSW facilitated patient discharge including contacting patient family (Son: [name]) and facility to confirm patient discharge plans. Clinical information faxed to facility and family agreeable with plan. CSW arranged [facility]. RN to call report prior to discharge [phone]).  CSW will sign off for now as social work intervention is no longer needed. Please consult us again if new needs arise.  Charlynn CourtSarah Deandra Gadson, CSW [phone]

## 2018-05-13 NOTE — Care Management Important Message (Signed)
Important Message  Patient Details  Name: Charlene Greene MRN: [id number] Date of Birth: 01-10-1922   Medicare Important Message Given:  Yes    Fedrick Cefalu P Airica Schwartzkopf 05/13/2018, 3:10 PM

## 2018-05-13 NOTE — Clinical Social Work Note (Addendum)
CSW notified ALF hospital liaison of potential plan for discharge today and that patient will need PT at the facility. OT to evaluate as well. CSW met with patient's son and daughter-in-law and answered questions. They are hopeful that discharge will be postponed until tomorrow but are aware it could occur today. Patient will need PTAR.  Dayton Scrape, CSW [phone]  1:49 pm ALF needs discharge summary and FL2 by 3:00 at the latest. CSW paged MD.  Dayton Scrape, [place]  2:34 pm Per MD, patient will discharge today and she will have discharge summary in soon. She will notify family. CSW notified ALF RN. She stated patient is on 2L oxygen at the facility and they are agreeable to DYS 3 (Mechanical soft) diet until speech has cleared her to return to regular diet.  Dayton Scrape, [place] [phone]  3:02 pm Discharge summary and [facility] faxed to [facility]. RN will review and call CSW back.  Dayton Scrape, [place]  3:18 pm Documents approved by ALF. CSW paged MD: Need FL2 cosigned, DNR signed, discharge order, and hard script for Augmentin so ALF can go ahead and order it.  Dayton Scrape, [place]

## 2018-05-13 NOTE — Evaluation (Signed)
Occupational Therapy Evaluation Patient Details Name: Charlene Greene MRN: [id number] DOB: [birth year] Today's Date: 05/13/2018    History of Present Illness 84yo female who comes to [facility] on 6/27 d/t 2W SOB and coughing. PMH: sick sinus syndrome s/p PPM, HTN, DM2, CVAx2  (2017, 2019), CHF.   Clinical Impression   PTA, pt was living at [facility] and received assistance for ADLs and used RW and transport chair for functional mobility. Pt currently requiring Min A for UB ADLs, Mod A for LB ADLs, Mod-Max A for toileting, and Min-Mod A for functional mobility with RW. Pt presenting with decreased strength, balance, and activity tolerance. During ADLs, pt SpO2 staying at 88%-91% on 3L O2 and reports fatigue and SOB. Pt would benefit from further acute OT to facilitate safe dc. Pending confirmation of support for BADLs at ALF, recommend dc to home with [facility] for further OT to optimize safety, independence with ADLs, and return to PLOF.       Follow Up Recommendations  Home health OT;Supervision/Assistance - 24 hour    Equipment Recommendations  Other (comment)(Defer to next venue)    Recommendations for Other Services PT consult     Precautions / Restrictions Precautions Precautions: Fall Restrictions Weight Bearing Restrictions: No      Mobility Bed Mobility Overal bed mobility: Needs Assistance Bed Mobility: Supine to Sit     Supine to sit: Min assist     General bed mobility comments: Pt in recliner upon arrival  Transfers Overall transfer level: Needs assistance Equipment used: Rolling walker (2 wheeled) Transfers: Sit to/from Stand Sit to Stand: Mod assist         General transfer comment: Mod A to power up into standing and then steady in standing    Balance Overall balance assessment: Needs assistance(heavily dependent on RW to maintain balance, reports fatgiue in BUE halfway to chair. ) Sitting-balance support: No upper extremity supported;Feet  supported Sitting balance-Leahy Scale: Fair     Standing balance support: Bilateral upper extremity supported;During functional activity Standing balance-Leahy Scale: Poor Standing balance comment: Reliant on UE support in standing                           ADL either performed or assessed with clinical judgement   ADL Overall ADL's : Needs assistance/impaired Eating/Feeding: Set up;Supervision/ safety;Sitting   Grooming: Oral care;Set up;Supervision/safety;Sitting Grooming Details (indicate cue type and reason): Pt performing oral care at sink with set up and supervision while seated. Upper Body Bathing: Set up;Supervision/ safety;Sitting   Lower Body Bathing: Moderate assistance;Sit to/from stand   Upper Body Dressing : Set up;Supervision/safety;Sitting   Lower Body Dressing: Moderate assistance;Sit to/from stand Lower Body Dressing Details (indicate cue type and reason): Pt able to adjust socks with increased time and effort. Assistance to complete task. Mod A to power up into standing and then gain balance Toilet Transfer: Moderate assistance;Ambulation;BSC;RW;Cueing for safety Toilet Transfer Details (indicate cue type and reason): Pt requiring Mod A for safety sit<>stand transfer and powering into standing. Toileting- Clothing Manipulation and Hygiene: Maximal assistance;Sit to/from stand Toileting - Clothing Manipulation Details (indicate cue type and reason): Pt able to maintain standing while reliant on RW for support. Max A for toilet hyginee at [facility]. Pt reporting that at the ALF they perform toilet hygiene.     Functional mobility during ADLs: Minimal assistance;Rolling walker General ADL Comments: Pt demonstrating decreased strength, balance, and activity tolerance. SpO2 88%-91% on 3L O2  Vision Baseline Vision/History: Wears glasses Patient Visual Report: No change from baseline       Perception     Praxis      Pertinent Vitals/Pain Pain  Assessment: No/denies pain     Hand Dominance Right   Extremity/Trunk Assessment Upper Extremity Assessment Upper Extremity Assessment: Generalized weakness   Lower Extremity Assessment Lower Extremity Assessment: Generalized weakness       Communication Communication Communication: Expressive difficulties(soft spoken)   Cognition Arousal/Alertness: Lethargic Behavior During Therapy: WFL for tasks assessed/performed Overall Cognitive Status: Within Functional Limits for tasks assessed                                 General Comments: Pt fatigued and reports she did not sleep well last night. Able to perform ADLs and follow simple commands   General Comments  SpO2 88-91% on 3L O2 and pt reports fatigue and SOB    Exercises     Shoulder Instructions      Home Living Family/patient expects to be discharged to:: Assisted living([facility] since Feb 2018 )                             Home Equipment: Dan HumphreysWalker - 4 wheels;Wheelchair - manual   Additional Comments: [facility] [place], and is dependent in transport chair to get around the facility. Pt reports O2 use at night.       Prior Functioning/Environment Level of Independence: Needs assistance  Gait / Transfers Assistance Needed: walker around room. Transported dependently in w/c to community activities ADL's / Homemaking Assistance Needed: Lives in assisted living facility with assistance for bathing and dressing per pt report. Makes small snacks for herself but has meals provided.    Comments: RW in room; Assistance for facility mobility in transport chair; assistance with bathing, dressing, toiletting.         OT Problem List: Decreased strength;Decreased range of motion;Decreased activity tolerance;Impaired balance (sitting and/or standing);Decreased knowledge of use of DME or AE;Decreased knowledge of  precautions;Cardiopulmonary status limiting activity      OT Treatment/Interventions: Self-care/ADL training;Therapeutic exercise;Energy conservation;DME and/or AE instruction;Therapeutic activities;Patient/family education    OT Goals(Current goals can be found in the care plan section) Acute Rehab OT Goals Patient Stated Goal: regain leg and arm strength  OT Goal Formulation: With patient Time For Goal Achievement: 05/27/18 Potential to Achieve Goals: Good ADL Goals Pt Will Perform Upper Body Dressing: with set-up;with supervision;sitting Pt Will Perform Lower Body Dressing: sit to/from stand;with min guard assist Pt Will Transfer to Toilet: with min guard assist;ambulating;bedside commode Pt Will Perform Toileting - Clothing Manipulation and hygiene: sit to/from stand;with min assist  OT Frequency: Min 2X/week   Barriers to D/C:            Co-evaluation              AM-PAC PT "6 Clicks" Daily Activity     Outcome Measure Help from another person eating meals?: A Little Help from another person taking care of personal grooming?: A Little Help from another person toileting, which includes using toliet, bedpan, or urinal?: A Lot Help from another person bathing (including washing, rinsing, drying)?: A Lot Help from another person to put on and taking off regular upper body clothing?: A Little Help from another person to put on  and taking off regular lower body clothing?: A Lot 6 Click Score: 15   End of Session Equipment Utilized During Treatment: Gait belt;Rolling walker;Oxygen Nurse Communication: Mobility status  Activity Tolerance: Patient tolerated treatment well;Patient limited by fatigue Patient left: in chair;with call bell/phone within reach;with chair alarm set  OT Visit Diagnosis: Unsteadiness on feet (R26.81);Other abnormalities of gait and mobility (R26.89);Muscle weakness (generalized) (M62.81)                Time: 1610-9604 OT Time Calculation (min): 32  min Charges:  OT General Charges $OT Visit: 1 Visit OT Evaluation $OT Eval Moderate Complexity: 1 Mod OT Treatments $Self Care/Home Management : 8-22 mins G-Codes:     Lu Paradise MSOT, OTR/L Acute Rehab Pager: [phone] Office: [phone]  Theodoro Grist Judd Mccubbin 05/13/2018, 12:44 PM

## 2018-05-13 NOTE — Progress Notes (Signed)
Pt ready for dc back to [facility]. All personal belongings with pt/ Pt in no distress.  Report called

## 2018-05-13 NOTE — NC FL2 (Signed)
[place] MEDICAID FL2 LEVEL OF CARE SCREENING TOOL     IDENTIFICATION  Patient Name: Charlene Greene Birthdate: 1922/06/25 Sex: female Admission Date (Current Location): 05/07/2018  [facility] and [place] Number:  Producer, television/film/video and Address:  [facility] [place], [address], [place], [place] [address]      Provider Number: [id number]  Attending Physician Name and Address:  Laverna Peace, MD  Relative Name and Phone Number:       Current Level of Care: Hospital Recommended Level of Care: Assisted Living Facility(with PT, OT, ST) Prior Approval Number:    Date Approved/Denied:   PASRR Number:    Discharge Plan: Other (Comment)(ALF with PT, OT, ST)    Current Diagnoses: Patient Active Problem List   Diagnosis Date Noted  . Pressure injury of skin 05/12/2018  . HCAP (healthcare-associated pneumonia) 05/08/2018  . Acute respiratory failure with hypoxia (HCC) 05/07/2018  . Sleep disturbance   . Elevated BUN   . Labile blood pressure   . Essential hypertension   . Dysphasia, post-stroke   . PAD (peripheral artery disease) (HCC)   . Hypoalbuminemia due to protein-calorie malnutrition (HCC)   . Diabetic peripheral neuropathy (HCC)   . Diabetes mellitus type 2 in nonobese (HCC)   . Chronic diastolic congestive heart failure (HCC)   . Left thalamic infarction (HCC) 02/26/2018  . Dysphagia, post-stroke   . Acute on chronic diastolic congestive heart failure (HCC)   . Type 2 diabetes mellitus with peripheral neuropathy (HCC)   . TIA (transient ischemic attack) 02/22/2018  . Labile blood glucose   . Hypotension due to drugs   . Hyponatremia   . Renovascular hypertension   . Hypertensive crisis   . Type 2 diabetes mellitus with diabetic autonomic neuropathy, without long-term current use of insulin (HCC)   . Stage 3 chronic kidney disease (HCC)   . Acute lower UTI   . Acute renal insufficiency 09/27/2016  . Urinary urgency 09/27/2016  . Basal  ganglia infarction (HCC) 09/26/2016  . Neuropathic pain   . Benign essential HTN   . Slow transit constipation   . Elevated creatine kinase   . Accelerated hypertension 09/24/2016  . Diabetes mellitus (HCC) 09/24/2016  . Hyperlipidemia 09/24/2016  . Sick sinus syndrome (HCC) 09/24/2016  . CVA (cerebral vascular accident) (HCC) 09/23/2016  . Stroke (cerebrum) (HCC) 09/23/2016    Orientation RESPIRATION BLADDER Height & Weight     Self, Time, Situation, Place  O2(Nasal Canula 1.5 L) Incontinent Weight: 161 lb 9.6 oz (73.3 kg) Height:  5' (152.4 cm)  BEHAVIORAL SYMPTOMS/MOOD NEUROLOGICAL BOWEL NUTRITION STATUS  (None) (None) Continent Diet(Mechanical soft)  AMBULATORY STATUS COMMUNICATION OF NEEDS Skin   Limited Assist Verbally PU Stage and Appropriate Care(Unspecified pressure injury: Mid sacrum. No dressing.) PU Stage 1 Dressing: No Dressing(Mid buttocks.)                     Personal Care Assistance Level of Assistance  Bathing, Feeding, Dressing Bathing Assistance: Limited assistance Feeding assistance: Limited assistance Dressing Assistance: Limited assistance     Functional Limitations Info  Sight, Hearing, Speech Sight Info: Adequate Hearing Info: Adequate Speech Info: Adequate    SPECIAL CARE FACTORS FREQUENCY  PT (By licensed PT), OT (By licensed OT)     PT Frequency: 3 x week OT Frequency: 3 x week  ST Frequency: 3 x week          Contractures Contractures Info: Not present  Additional Factors Info  Code Status, Allergies Code Status Info: DNR Allergies Info: Statins, Other, Amlodipine, Celecoxib, Codeine, Naproxen, Piroxicam.           Current Medications (05/13/2018):  This is the current hospital active medication list Current Facility-Administered Medications  Medication Dose Route Frequency Provider Last Rate Last Dose  . 0.9 %  sodium chloride infusion   Intravenous PRN Laverna Peace, MD   Stopped at 05/10/18 0335  . acetaminophen  (TYLENOL) tablet 650 mg  650 mg Oral Q6H PRN Eduard Clos, MD       Or  . acetaminophen (TYLENOL) suppository 650 mg  650 mg Rectal Q6H PRN Eduard Clos, MD      . amLODipine (NORVASC) tablet 5 mg  5 mg Oral BID Eduard Clos, MD   5 mg at 05/13/18 0845  . amoxicillin-clavulanate (AUGMENTIN) 875-125 MG per tablet 1 tablet  1 tablet Oral Q12H Roberto Scales D, MD   1 tablet at 05/13/18 0844  . benzonatate (TESSALON) capsule 200 mg  200 mg Oral TID PRN Roberto Scales D, MD   200 mg at 05/13/18 0845  . clopidogrel (PLAVIX) tablet 75 mg  75 mg Oral Daily Eduard Clos, MD   75 mg at 05/13/18 0845  . dextromethorphan-guaiFENesin (MUCINEX DM) 30-600 MG per 12 hr tablet 1 tablet  1 tablet Oral BID Roberto Scales D, MD   1 tablet at 05/13/18 0845  . enoxaparin (LOVENOX) injection 40 mg  40 mg Subcutaneous Q24H Eduard Clos, MD   40 mg at 05/12/18 1225  . feeding supplement (ENSURE ENLIVE) (ENSURE ENLIVE) liquid 237 mL  237 mL Oral BID BM Roberto Scales D, MD   237 mL at 05/13/18 1000  . furosemide (LASIX) tablet 40 mg  40 mg Oral Daily Roberto Scales D, MD   40 mg at 05/13/18 0845  . hydrALAZINE (APRESOLINE) injection 10 mg  10 mg Intravenous Q4H PRN Eduard Clos, MD      . insulin aspart (novoLOG) injection 0-9 Units  0-9 Units Subcutaneous TID WC Eduard Clos, MD   1 Units at 05/13/18 1202  . isosorbide mononitrate (IMDUR) 24 hr tablet 60 mg  60 mg Oral Daily Eduard Clos, MD   60 mg at 05/13/18 0844  . lisinopril (PRINIVIL,ZESTRIL) tablet 30 mg  30 mg Oral Daily Eduard Clos, MD   30 mg at 05/13/18 0844  . magnesium oxide (MAG-OX) tablet 400 mg  400 mg Oral QHS Eduard Clos, MD   400 mg at 05/12/18 2203  . metoprolol succinate (TOPROL-XL) 24 hr tablet 100 mg  100 mg Oral Daily Eduard Clos, MD   100 mg at 05/13/18 0845  . multivitamin with minerals tablet 1 tablet  1 tablet Oral Daily Roberto Scales D, MD   1 tablet at  05/13/18 0845  . nitroGLYCERIN (NITROSTAT) SL tablet 0.4 mg  0.4 mg Sublingual Q5 min PRN Eduard Clos, MD      . ondansetron [facility]) tablet 4 mg  4 mg Oral Q6H PRN Eduard Clos, MD       Or  . ondansetron [facility]) injection 4 mg  4 mg Intravenous Q6H PRN Eduard Clos, MD      . ranolazine (RANEXA) 12 hr tablet 500 mg  500 mg Oral BID Eduard Clos, MD   500 mg at 05/13/18 0844  . senna (SENOKOT) tablet 8.6 mg  1 tablet Oral Daily PRN Midge Minium  N, MD      . triamterene-hydrochlorothiazide (MAXZIDE-25) 37.5-25 MG per tablet 1 tablet  1 tablet Oral Daily Eduard Clos, MD   1 tablet at 05/13/18 0853     Discharge Medications: STOP taking these medications   aspirin 81 MG EC tablet   azithromycin 250 MG tablet Commonly known as:  ZITHROMAX   busPIRone 5 MG tablet Commonly known as:  BUSPAR   ezetimibe 10 MG tablet Commonly known as:  ZETIA   fenofibrate 160 MG tablet   predniSONE 5 MG tablet Commonly known as:  DELTASONE     TAKE these medications   acetaminophen 500 MG tablet Commonly known as:  TYLENOL Take 500 mg by mouth 2 (two) times daily. Additional Order: Take 500mg  every 12 hours as needed for pain. NTE 2 tablets in 24 hours.   amLODipine 5 MG tablet Commonly known as:  NORVASC Take 1 tablet (5 mg total) by mouth 2 (two) times daily.   amoxicillin-clavulanate 875-125 MG tablet Commonly known as:  AUGMENTIN Take 1 tablet by mouth every 12 (twelve) hours for 7 doses.   benzonatate 100 MG capsule Commonly known as:  TESSALON Take 100 mg by mouth daily. For 10 days   cholecalciferol 1000 units tablet Commonly known as:  VITAMIN D Take 1,000 Units by mouth daily.   clopidogrel 75 MG tablet Commonly known as:  PLAVIX Take 1 tablet (75 mg total) by mouth daily.   dextromethorphan-guaiFENesin 30-600 MG 12hr tablet Commonly known as:  MUCINEX DM Take 1 tablet by mouth 2 (two) times daily. What changed:   additional instructions   feeding supplement (ENSURE ENLIVE) Liqd Take 237 mLs by mouth 2 (two) times daily between meals. Start taking on:  05/14/2018   furosemide 20 MG tablet Commonly known as:  LASIX Take 2 tablets (40 mg total) by mouth daily. What changed:  how much to take   glimepiride 1 MG tablet Commonly known as:  AMARYL Take 1 tablet (1 mg total) by mouth daily with breakfast.   Ipratropium-Albuterol 20-100 MCG/ACT Aers respimat Commonly known as:  COMBIVENT Inhale 2 puffs into the lungs 2 (two) times daily. For 2 weeks   isosorbide mononitrate 60 MG 24 hr tablet Commonly known as:  IMDUR Take 1 tablet (60 mg total) by mouth daily.   lisinopril 20 MG tablet Commonly known as:  PRINIVIL,ZESTRIL Take 1 tablet (20 mg total) by mouth 2 (two) times daily. What changed:    how much to take  when to take this   Magnesium 250 MG Tabs Take 250 mg by mouth at bedtime.   metoprolol succinate 100 MG 24 hr tablet Commonly known as:  TOPROL-XL Take 1 tablet (100 mg total) by mouth daily.   multivitamin with minerals Tabs tablet Take 1 tablet by mouth daily. Start taking on:  05/14/2018   nitroGLYCERIN 0.4 MG SL tablet Commonly known as:  NITROSTAT Place 0.4 mg under the tongue every 5 (five) minutes as needed for chest pain (max 3 doses in 15 minutes).   ranolazine 500 MG 12 hr tablet Commonly known as:  RANEXA Take 1 tablet (500 mg total) by mouth 2 (two) times daily.   senna 8.6 MG Tabs tablet Commonly known as:  SENOKOT Take 1 tablet by mouth daily as needed for mild constipation. NTE 8.6mg  in 24 hours   triamterene-hydrochlorothiazide 37.5-25 MG tablet Commonly known as:  MAXZIDE-25 Take 1 tablet by mouth daily. Additional Order: Take 0.5 tablet by mouth on Monday, Wednesday, and  Friday.      Relevant Imaging Results:  Relevant Lab Results:   Additional Information SS#: [id number]  Margarito LinerSarah C Arlie Posch, LCSW

## 2018-05-13 NOTE — Progress Notes (Signed)
Patient with output of only 50 ml during the night. Patient stated that she feels urge to void. Attempted to encourage patient to void but unsuccessfully. Bladder scan was done shows greater than 490 ml. I and O cath done, output of 650 ml.  Darvin Dials, RN

## 2018-05-14 LAB — GLUCOSE, CAPILLARY: GLUCOSE-CAPILLARY: 140 mg/dL — AB (ref 70–99)

## 2018-05-14 NOTE — Progress Notes (Signed)
Physical Therapy Treatment Patient Details Name: Charlene Greene MRN: [id number] DOB: [birth year] Today's Date: 05/14/2018    History of Present Illness 84yo female who comes to [facility] on 6/27 d/t 2W SOB and coughing. PMH: sick sinus syndrome s/p PPM, HTN, DM2, CVAx2  (2017, 2019), CHF.    PT Comments    On arrival to room, pt laying in urin and requesting dry linins. Pt agreeable to transfer to chair only, declined further ambulation. Assist required for gown change and peri care. Pt expected to d/c today back to ALF with HHPT to follow up.     Follow Up Recommendations  Home health PT;Supervision for mobility/OOB(return to [facility] at DC )     Equipment Recommendations  None recommended by PT    Recommendations for Other Services       Precautions / Restrictions Precautions Precautions: Fall Restrictions Weight Bearing Restrictions: No    Mobility  Bed Mobility Overal bed mobility: Needs Assistance Bed Mobility: Supine to Sit     Supine to sit: Min assist     General bed mobility comments: min HHA to elevate trunk and scoot hips forward to EOB  Transfers Overall transfer level: Needs assistance Equipment used: Rolling walker (2 wheeled);1 person hand held assist Transfers: Sit to/from Stand Sit to Stand: Mod assist         General transfer comment: Mod A to power up with HHA. Cues for hand placement on bed instead of RW  Ambulation/Gait Ambulation/Gait assistance: Min guard Gait Distance (Feet): 5 Feet Assistive device: Rolling walker (2 wheeled) Gait Pattern/deviations: Shuffle;Trunk flexed     General Gait Details: Pt with slow weak gait. Does not ambulate much at base line. VC for RW proximity.   Stairs             Wheelchair Mobility    Modified Rankin (Stroke Patients Only)       Balance Overall balance assessment: Needs assistance(heavily dependent on RW to maintain balance) Sitting-balance support: No upper extremity  supported;Feet supported Sitting balance-Leahy Scale: Fair     Standing balance support: Bilateral upper extremity supported;During functional activity Standing balance-Leahy Scale: Poor Standing balance comment: Reliant on UE support in standing                            Cognition Arousal/Alertness: Awake/alert Behavior During Therapy: WFL for tasks assessed/performed Overall Cognitive Status: Within Functional Limits for tasks assessed                                        Exercises      General Comments        Pertinent Vitals/Pain Pain Assessment: No/denies pain    Home Living                      Prior Function            PT Goals (current goals can now be found in the care plan section) Acute Rehab PT Goals Patient Stated Goal: regain leg and arm strength  PT Goal Formulation: With patient Time For Goal Achievement: 05/27/18 Potential to Achieve Goals: Good Progress towards PT goals: Progressing toward goals    Frequency    Min 2X/week      PT Plan Current plan remains appropriate    Co-evaluation  AM-PAC PT "6 Clicks" Daily Activity  Outcome Measure  Difficulty turning over in bed (including adjusting bedclothes, sheets and blankets)?: A Lot Difficulty moving from lying on back to sitting on the side of the bed? : Unable Difficulty sitting down on and standing up from a chair with arms (e.g., wheelchair, bedside commode, etc,.)?: Unable Help needed moving to and from a bed to chair (including a wheelchair)?: Total Help needed walking in hospital room?: A Little Help needed climbing 3-5 steps with a railing? : A Lot 6 Click Score: 10    End of Session Equipment Utilized During Treatment: Gait belt;Oxygen Activity Tolerance: Patient tolerated treatment well;Patient limited by fatigue Patient left: in chair;with call bell/phone within reach;with chair alarm set Nurse Communication: Mobility  status PT Visit Diagnosis: Unsteadiness on feet (R26.81);Difficulty in walking, not elsewhere classified (R26.2);Muscle weakness (generalized) (M62.81)     Time: [id number] PT Time Calculation (min) (ACUTE ONLY): 21 min  Charges:  $Therapeutic Activity: 8-22 mins                    G Codes:       Kallie LocksHannah Keiondre Colee, [place]A Pager [phone] Acute Rehab   Sheral ApleyHannah E Latorria Zeoli 05/14/2018, 10:30 AM

## 2018-05-14 NOTE — Clinical Social Work Note (Signed)
CSW facilitated patient discharge including contacting patient family (Son: Mendel RyderRon Aldridge) and facility to confirm patient discharge plans. Clinical information faxed to facility and family agreeable with plan. CSW [facility]. RN to call report prior to discharge [phone]).  CSW will sign off for now as social work intervention is no longer needed. Please consult us again if new needs arise.  Charlynn CourtSarah Tache Bobst, CSW [phone]

## 2018-05-14 NOTE — Clinical Social Work Note (Addendum)
Received call from RN that patient did not discharge last night due to [facility] taking too long to pick patient up. CSW left voicemail for ALF RN to see if they needed an updated discharge summary with today's date or not.  Charlynn CourtSarah Klaire Court, CSW [phone]  8:47 am ALF needs updated discharge summary with today's date before she can return. CSW paged MD to notify.  Charlynn CourtSarah Ophia Shamoon, CSW [phone]

## 2018-05-14 NOTE — Progress Notes (Signed)
[facility]. Tele box removed. [facility] at bedside to transport.

## 2018-05-14 NOTE — Progress Notes (Signed)
Mrs. Charlene Greene is feeling well this morning, her dyspnea is returning back to baseline.  She has been using supplemental oxygen at night for the last couple years. On her physical examination she is hemodynamically stable, her lungs are clear to auscultation, heart S1-S2 present, rhythmic, abdomen soft nontender, no lower extremity edema.  Patient will be discharge today.  Med reconciliation reviewed, with with primary care provider in 7 days.

## 2018-08-01 ENCOUNTER — Inpatient Hospital Stay (HOSPITAL_COMMUNITY)
Admission: EM | Admit: 2018-08-01 | Discharge: 2018-08-07 | DRG: 280 | Disposition: A | Discharge status: Home Health | Payer: Medicare Other | Attending: Family Medicine | Admitting: Family Medicine

## 2018-08-01 ENCOUNTER — Encounter (HOSPITAL_COMMUNITY): Payer: Self-pay | Admitting: Emergency Medicine

## 2018-08-01 ENCOUNTER — Emergency Department (HOSPITAL_COMMUNITY): Payer: Medicare Other

## 2018-08-01 DIAGNOSIS — I10 Essential (primary) hypertension: Secondary | ICD-10-CM | POA: Diagnosis not present

## 2018-08-01 DIAGNOSIS — Z9109 Other allergy status, other than to drugs and biological substances: Secondary | ICD-10-CM

## 2018-08-01 DIAGNOSIS — I701 Atherosclerosis of renal artery: Secondary | ICD-10-CM | POA: Diagnosis present

## 2018-08-01 DIAGNOSIS — Z8673 Personal history of transient ischemic attack (TIA), and cerebral infarction without residual deficits: Secondary | ICD-10-CM

## 2018-08-01 DIAGNOSIS — Z9981 Dependence on supplemental oxygen: Secondary | ICD-10-CM

## 2018-08-01 DIAGNOSIS — R0902 Hypoxemia: Secondary | ICD-10-CM | POA: Diagnosis present

## 2018-08-01 DIAGNOSIS — I493 Ventricular premature depolarization: Secondary | ICD-10-CM | POA: Diagnosis present

## 2018-08-01 DIAGNOSIS — I25119 Atherosclerotic heart disease of native coronary artery with unspecified angina pectoris: Secondary | ICD-10-CM

## 2018-08-01 DIAGNOSIS — R778 Other specified abnormalities of plasma proteins: Secondary | ICD-10-CM | POA: Diagnosis present

## 2018-08-01 DIAGNOSIS — Z7984 Long term (current) use of oral hypoglycemic drugs: Secondary | ICD-10-CM

## 2018-08-01 DIAGNOSIS — Z7902 Long term (current) use of antithrombotics/antiplatelets: Secondary | ICD-10-CM

## 2018-08-01 DIAGNOSIS — Z66 Do not resuscitate: Secondary | ICD-10-CM | POA: Diagnosis present

## 2018-08-01 DIAGNOSIS — Z950 Presence of cardiac pacemaker: Secondary | ICD-10-CM

## 2018-08-01 DIAGNOSIS — I5033 Acute on chronic diastolic (congestive) heart failure: Secondary | ICD-10-CM | POA: Diagnosis present

## 2018-08-01 DIAGNOSIS — I495 Sick sinus syndrome: Secondary | ICD-10-CM | POA: Diagnosis not present

## 2018-08-01 DIAGNOSIS — Z886 Allergy status to analgesic agent status: Secondary | ICD-10-CM

## 2018-08-01 DIAGNOSIS — I639 Cerebral infarction, unspecified: Secondary | ICD-10-CM | POA: Diagnosis present

## 2018-08-01 DIAGNOSIS — I214 Non-ST elevation (NSTEMI) myocardial infarction: Secondary | ICD-10-CM | POA: Diagnosis present

## 2018-08-01 DIAGNOSIS — Z960 Presence of urogenital implants: Secondary | ICD-10-CM | POA: Diagnosis present

## 2018-08-01 DIAGNOSIS — E119 Type 2 diabetes mellitus without complications: Secondary | ICD-10-CM

## 2018-08-01 DIAGNOSIS — I130 Hypertensive heart and chronic kidney disease with heart failure and stage 1 through stage 4 chronic kidney disease, or unspecified chronic kidney disease: Secondary | ICD-10-CM | POA: Diagnosis not present

## 2018-08-01 DIAGNOSIS — R7989 Other specified abnormal findings of blood chemistry: Secondary | ICD-10-CM

## 2018-08-01 DIAGNOSIS — I2510 Atherosclerotic heart disease of native coronary artery without angina pectoris: Secondary | ICD-10-CM | POA: Diagnosis present

## 2018-08-01 DIAGNOSIS — J9811 Atelectasis: Secondary | ICD-10-CM | POA: Diagnosis present

## 2018-08-01 DIAGNOSIS — I21A1 Myocardial infarction type 2: Secondary | ICD-10-CM | POA: Diagnosis present

## 2018-08-01 DIAGNOSIS — Z91048 Other nonmedicinal substance allergy status: Secondary | ICD-10-CM

## 2018-08-01 DIAGNOSIS — Z885 Allergy status to narcotic agent status: Secondary | ICD-10-CM

## 2018-08-01 DIAGNOSIS — R0600 Dyspnea, unspecified: Secondary | ICD-10-CM

## 2018-08-01 DIAGNOSIS — I252 Old myocardial infarction: Secondary | ICD-10-CM

## 2018-08-01 DIAGNOSIS — Z888 Allergy status to other drugs, medicaments and biological substances status: Secondary | ICD-10-CM

## 2018-08-01 DIAGNOSIS — N183 Chronic kidney disease, stage 3 unspecified: Secondary | ICD-10-CM | POA: Diagnosis present

## 2018-08-01 DIAGNOSIS — I158 Other secondary hypertension: Secondary | ICD-10-CM | POA: Diagnosis present

## 2018-08-01 DIAGNOSIS — E1122 Type 2 diabetes mellitus with diabetic chronic kidney disease: Secondary | ICD-10-CM | POA: Diagnosis present

## 2018-08-01 DIAGNOSIS — I472 Ventricular tachycardia: Secondary | ICD-10-CM | POA: Diagnosis present

## 2018-08-01 LAB — CBC WITH DIFFERENTIAL/PLATELET
Abs Immature Granulocytes: 0.1 10*3/uL (ref 0.0–0.1)
Basophils Absolute: 0 10*3/uL (ref 0.0–0.1)
Basophils Relative: 0 %
EOS ABS: 0 10*3/uL (ref 0.0–0.7)
EOS PCT: 0 %
HEMATOCRIT: 42.9 % (ref 36.0–46.0)
Hemoglobin: 13.7 g/dL (ref 12.0–15.0)
Immature Granulocytes: 1 %
Lymphocytes Relative: 9 %
Lymphs Abs: 0.8 10*3/uL (ref 0.7–4.0)
MCH: 29.2 pg (ref 26.0–34.0)
MCHC: 31.9 g/dL (ref 30.0–36.0)
MCV: 91.5 fL (ref 78.0–100.0)
Monocytes Absolute: 0.6 10*3/uL (ref 0.1–1.0)
Monocytes Relative: 7 %
Neutro Abs: 7.6 10*3/uL (ref 1.7–7.7)
Neutrophils Relative %: 83 %
Platelets: 198 10*3/uL (ref 150–400)
RBC: 4.69 MIL/uL (ref 3.87–5.11)
RDW: 15.1 % (ref 11.5–15.5)
WBC: 9.1 10*3/uL (ref 4.0–10.5)

## 2018-08-01 LAB — I-STAT TROPONIN, ED
TROPONIN I, POC: 0.04 ng/mL (ref 0.00–0.08)
Troponin i, poc: 0.2 ng/mL (ref 0.00–0.08)

## 2018-08-01 LAB — COMPREHENSIVE METABOLIC PANEL
ALBUMIN: 3.3 g/dL — AB (ref 3.5–5.0)
ALT: 27 U/L (ref 0–44)
ANION GAP: 12 (ref 5–15)
AST: 25 U/L (ref 15–41)
Alkaline Phosphatase: 88 U/L (ref 38–126)
BILIRUBIN TOTAL: 0.8 mg/dL (ref 0.3–1.2)
BUN: 16 mg/dL (ref 8–23)
CALCIUM: 8.7 mg/dL — AB (ref 8.9–10.3)
CO2: 28 mmol/L (ref 22–32)
Chloride: 98 mmol/L (ref 98–111)
Creatinine, Ser: 0.82 mg/dL (ref 0.44–1.00)
GFR calc non Af Amer: 60 mL/min — ABNORMAL LOW (ref >60)
Glucose, Bld: 284 mg/dL — ABNORMAL HIGH (ref 70–99)
Potassium: 4.1 mmol/L (ref 3.5–5.1)
Sodium: 138 mmol/L (ref 135–145)
TOTAL PROTEIN: 6 g/dL — AB (ref 6.5–8.1)

## 2018-08-01 MED ORDER — GLIMEPIRIDE 1 MG PO TABS
1.0000 mg | ORAL_TABLET | Freq: Every day | ORAL | Status: DC
  Filled 2018-08-01: qty 1

## 2018-08-01 MED ORDER — ENSURE ENLIVE PO LIQD
237.0000 mL | Freq: Two times a day (BID) | ORAL | Status: DC
  Filled 2018-08-01: qty 237

## 2018-08-01 MED ORDER — CLOPIDOGREL BISULFATE 75 MG PO TABS
75.0000 mg | ORAL_TABLET | Freq: Every day | ORAL | Status: DC
  Administered 2018-08-01 – 2018-08-07 (×7): 75 mg via ORAL
  Filled 2018-08-01 (×7): qty 1

## 2018-08-01 MED ORDER — ACETAMINOPHEN 500 MG PO TABS: 500.0000 mg | ORAL_TABLET | Freq: Once | ORAL | Status: DC

## 2018-08-01 MED ORDER — AMLODIPINE BESYLATE 5 MG PO TABS
5.0000 mg | ORAL_TABLET | Freq: Two times a day (BID) | ORAL | Status: DC
  Administered 2018-08-01 – 2018-08-07 (×12): 5 mg via ORAL
  Filled 2018-08-01 (×12): qty 1

## 2018-08-01 MED ORDER — HEPARIN (PORCINE) IN NACL 100-0.45 UNIT/ML-% IJ SOLN
1200.0000 [IU]/h | INTRAMUSCULAR | Status: DC
  Administered 2018-08-02: 800 [IU]/h via INTRAVENOUS
  Filled 2018-08-01 (×2): qty 250

## 2018-08-01 MED ORDER — LISINOPRIL 10 MG PO TABS
20.0000 mg | ORAL_TABLET | Freq: Two times a day (BID) | ORAL | Status: DC
  Administered 2018-08-01 – 2018-08-07 (×12): 20 mg via ORAL
  Filled 2018-08-01 (×8): qty 2
  Filled 2018-08-01: qty 1
  Filled 2018-08-01 (×3): qty 2

## 2018-08-01 MED ORDER — HEPARIN BOLUS VIA INFUSION
4000.0000 [IU] | Freq: Once | INTRAVENOUS | Status: AC
  Administered 2018-08-02: 4000 [IU] via INTRAVENOUS
  Filled 2018-08-01: qty 4000

## 2018-08-01 MED ORDER — RANOLAZINE ER 500 MG PO TB12
500.0000 mg | ORAL_TABLET | Freq: Two times a day (BID) | ORAL | Status: DC
  Administered 2018-08-02 – 2018-08-07 (×11): 500 mg via ORAL
  Filled 2018-08-01 (×13): qty 1

## 2018-08-01 MED ORDER — FUROSEMIDE 10 MG/ML IJ SOLN
40.0000 mg | Freq: Once | INTRAMUSCULAR | Status: AC
  Administered 2018-08-02: 40 mg via INTRAVENOUS
  Filled 2018-08-01: qty 4

## 2018-08-01 MED ORDER — CEPHALEXIN 250 MG PO CAPS: 500.0000 mg | ORAL_CAPSULE | Freq: Once | ORAL | Status: DC

## 2018-08-01 MED ORDER — ASPIRIN 325 MG PO TABS
325.0000 mg | ORAL_TABLET | Freq: Once | ORAL | Status: AC
  Administered 2018-08-01: 325 mg via ORAL
  Filled 2018-08-01: qty 1

## 2018-08-01 MED ORDER — ISOSORBIDE MONONITRATE ER 30 MG PO TB24
30.0000 mg | ORAL_TABLET | Freq: Every day | ORAL | Status: DC
  Administered 2018-08-02: 30 mg via ORAL
  Filled 2018-08-01: qty 1

## 2018-08-01 MED ORDER — FUROSEMIDE 20 MG PO TABS
20.0000 mg | ORAL_TABLET | Freq: Every day | ORAL | Status: DC
  Administered 2018-08-02 – 2018-08-04 (×3): 20 mg via ORAL
  Filled 2018-08-01 (×3): qty 1

## 2018-08-01 MED ORDER — METOPROLOL SUCCINATE ER 100 MG PO TB24
100.0000 mg | ORAL_TABLET | Freq: Every day | ORAL | Status: DC
  Administered 2018-08-02 – 2018-08-07 (×6): 100 mg via ORAL
  Filled 2018-08-01 (×7): qty 1

## 2018-08-01 MED ORDER — SENNA 8.6 MG PO TABS: 1.0000 | ORAL_TABLET | Freq: Every day | ORAL | Status: DC | PRN

## 2018-08-01 MED ORDER — ADULT MULTIVITAMIN W/MINERALS CH: 1.0000 | ORAL_TABLET | Freq: Every day | ORAL | Status: DC

## 2018-08-01 MED ORDER — ASPIRIN 81 MG PO CHEW
81.0000 mg | CHEWABLE_TABLET | Freq: Every day | ORAL | Status: DC
  Administered 2018-08-02 – 2018-08-07 (×6): 81 mg via ORAL
  Filled 2018-08-01 (×6): qty 1

## 2018-08-01 NOTE — Consult Note (Signed)
Cardiology Consultation:   Patient ID: Charlene Greene MRN: [id number]; DOB: 08/31/1922  Admit date: 08/01/2018 Date of Consult: 08/01/2018  Primary Care Provider: Almetta Lovely, [facility] Primary Cardiologist:  Willeen Cass [place])  Patient Profile:   Charlene Greene is a 84 y.o. female with a history of CAD, HTN, HLD, sick sinus syndrome s/p PPM (MDT), prior CVAs, DM, who is being seen today for the evaluation of elevated troponin at the request of Dr. Silverio Lay.  History of Present Illness:   Charlene Greene is a 84 y.o. female with a history of CAD, HTN, HLD, sick sinus syndrome s/p PPM (MDT), prior CVAs, DM, who presents with chest pain and elevated troponin.   The patient has a history of multiple cardiac comorbidities and follows with Dr. Willeen Cass at [place]. Her last nuclear stress test in 2015 showed mild ischemia in the inferolateral region. Her last echocardiogram (done at [facility] in 02/2018) showed normal EF with grade 1 DD. She had a device interrogation earlier this month that showed normal parameters without any abnormal episodes.   She was hospitalized at [facility] in July 2019 for PNA and heart failure exacerbation. She was treated with antibiotics and IV diuresis. She was discharged to ALF.  She presents to the ED this evening after an episode of chest pain. She reports that she was out in town when she developed pain in her chest. She was given NTG with improvement of her symptoms. EMS was called. She reportedly had WCT and was given IV amiodarone en route to the ED.   At the ED, pt with SBP 170-180s. ECG showed sinus rhythm and multiple PVCs with nonspecific ST changes. Troponin was initially negative, but repeat measurement was 0.2. CXR showed trace edema. Device interrogation was done and showed no ventricular arrhythmias. She was started on heparin and cardiology consult was requested.   On my evaluation, the patient is resting and in NAD. History is limited due to suspected memory decline: she  is AOx3 for me but believes it to be winter and cannot tell me any of her past cardiac history or physicians. She reports that she had an episode of chest pain today that resolved after an unclear duration of time. She denies dyspnea, LE edema, palpitations or other symptoms. She is currently chest pain free. Her strips from EMS were reviewed and suspect rhythm was consistent with sinus rhythm with RV pacing.   Past Medical History:  Diagnosis Date  . CAD (coronary artery disease)   . Diabetes mellitus without complication (HCC)   . Hyperlipidemia   . Hypertension   . Pacemaker   . Renal artery stenosis (HCC)    s/p bilateral stents  . Sick sinus syndrome (HCC)   . Stroke [facility])    2 previous cva's    Past Surgical History:  Procedure Laterality Date  . PACEMAKER PLACEMENT  05/15/2014   MDT Advisa MRI implanted at [facility] for sick sinus syndrome     Home Medications:  Prior to Admission medications   Medication Sig Start Date End Date Taking? Authorizing Provider  acetaminophen (TYLENOL) 500 MG tablet Take 500 mg by mouth See admin instructions. Take 500 mg by mouth two times a day and an additional 500 mg every 12 hours as needed for pain or fever- NOT TO EXCEED 2 "AS NEEDED" TABLETS IN 24 HOURS   Yes [provider]  amLODipine (NORVASC) 5 MG tablet Take 1 tablet (5 mg total) by mouth 2 (two) times daily. 03/13/18  Yes Angiulli, Mcarthur Rossetti,  PA-C  Cholecalciferol (VITAMIN D-3) 1000 units CAPS Take 1,000 Units by mouth daily.   Yes [provider]  clopidogrel (PLAVIX) 75 MG tablet Take 1 tablet (75 mg total) by mouth daily. 03/13/18  Yes Angiulli, Mcarthur Rossetti, PA-C  dextromethorphan-guaiFENesin (MUCINEX DM) 30-600 MG 12hr tablet Take 1 tablet by mouth 2 (two) times daily. 05/13/18  Yes Roberto Scales D, MD  feeding supplement, ENSURE ENLIVE, (ENSURE ENLIVE) LIQD Take 237 mLs by mouth 2 (two) times daily between meals. 05/14/18  Yes Roberto Scales D, MD  furosemide (LASIX) 20 MG  tablet Take 2 tablets (40 mg total) by mouth daily. Patient taking differently: Take 20 mg by mouth daily.  05/13/18  Yes Roberto Scales D, MD  glimepiride (AMARYL) 1 MG tablet Take 1 tablet (1 mg total) by mouth daily with breakfast. 03/13/18  Yes Angiulli, Mcarthur Rossetti, PA-C  isosorbide mononitrate (IMDUR) 30 MG 24 hr tablet Take 30 mg by mouth daily.   Yes [provider]  lisinopril (PRINIVIL,ZESTRIL) 20 MG tablet Take 1 tablet (20 mg total) by mouth 2 (two) times daily. 03/13/18  Yes Angiulli, Mcarthur Rossetti, PA-C  Magnesium 250 MG TABS Take 250 mg by mouth at bedtime.   Yes [provider]  metoprolol succinate (TOPROL-XL) 100 MG 24 hr tablet Take 1 tablet (100 mg total) by mouth daily. 03/13/18  Yes Angiulli, Mcarthur Rossetti, PA-C  Multiple Vitamin (MULTIVITAMIN WITH MINERALS) TABS tablet Take 1 tablet by mouth daily. 05/14/18  Yes Roberto Scales D, MD  nitroGLYCERIN (NITROSTAT) 0.4 MG SL tablet Place 0.4 mg under the tongue every 5 (five) minutes x 3 doses as needed for chest pain (and call 9-1-1 and MD, if no relief).    Yes [provider]  ranolazine (RANEXA) 500 MG 12 hr tablet Take 1 tablet (500 mg total) by mouth 2 (two) times daily. 10/11/16  Yes Angiulli, Mcarthur Rossetti, PA-C  senna (SENOKOT) 8.6 MG TABS tablet Take 1 tablet by mouth daily as needed for mild constipation (and not to exceed 1 tablet/24 hours).    Yes [provider]  triamterene-hydrochlorothiazide Joseph Pierini) 37.5-25 MG tablet Take 0.5 tablets by mouth every Monday, Wednesday, and Friday.  05/07/18 08/01/18 Yes [provider]  Ipratropium-Albuterol (COMBIVENT) 20-100 MCG/ACT AERS respimat Inhale 2 puffs into the lungs 2 (two) times daily. For 2 weeks 05/06/18 08/01/18  [provider]  isosorbide mononitrate (IMDUR) 60 MG 24 hr tablet Take 1 tablet (60 mg total) by mouth daily. Patient not taking: Reported on 08/01/2018 10/11/16   Charlton Amor, PA-C    Inpatient Medications: Scheduled  Meds: . amLODipine  5 mg Oral BID  . clopidogrel  75 mg Oral Daily  . [START ON 08/02/2018] feeding supplement (ENSURE ENLIVE)  237 mL Oral BID BM  . furosemide  40 mg Intravenous Once  . [START ON 08/02/2018] furosemide  20 mg Oral Daily  . [START ON 08/02/2018] glimepiride  1 mg Oral Q breakfast  . heparin  4,000 Units Intravenous Once  . isosorbide mononitrate  30 mg Oral Daily  . lisinopril  20 mg Oral BID  . [START ON 08/02/2018] metoprolol succinate  100 mg Oral Daily  . [START ON 08/02/2018] multivitamin with minerals  1 tablet Oral Daily  . ranolazine  500 mg Oral BID   Continuous Infusions: . heparin     PRN Meds: senna  Allergies:    Allergies  Allergen Reactions  . Statins Other (See Comments)    Muscle weakness/pain  .  Other Other (See Comments)    Reductase Inhibitors- "Allergic," per MAR/reaction not recalled by the patient "Dye"- "Allergic," per MAR/reaction not recalled by the patient  . Amlodipine Other (See Comments)    Unknown   . Celecoxib Nausea Only and Other (See Comments)  . Codeine Other (See Comments)    "Allergic," per MAR/reaction not recalled by the patient  . Naproxen Other (See Comments)    Unknown   . Piroxicam Other (See Comments)    "Allergic," per MAR/reaction not recalled by the patient    Social History:   Social History   Socioeconomic History  . Marital status: Widowed    Spouse name: Not on file  . Number of children: Not on file  . Years of education: Not on file  . Highest education level: Not on file  Occupational History  . Not on file  Social Needs  . Financial resource strain: Not on file  . Food insecurity:    Worry: Not on file    Inability: Not on file  . Transportation needs:    Medical: Not on file    Non-medical: Not on file  Tobacco Use  . Smoking status: Never Smoker  . Smokeless tobacco: Never Used  Substance and Sexual Activity  . Alcohol use: No  . Drug use: Never  . Sexual activity: Not on file   Lifestyle  . Physical activity:    Days per week: Not on file    Minutes per session: Not on file  . Stress: Not on file  Relationships  . Social connections:    Talks on phone: Not on file    Gets together: Not on file    Attends religious service: Not on file    Active member of club or organization: Not on file    Attends meetings of clubs or organizations: Not on file    Relationship status: Not on file  . Intimate partner violence:    Fear of current or ex partner: Not on file    Emotionally abused: Not on file    Physically abused: Not on file    Forced sexual activity: Not on file  Other Topics Concern  . Not on file  Social History Narrative  . Not on file    Family History:    Family History  Problem Relation Age of Onset  . Lung cancer Sister   . Hypertension Other   . CAD Neg Hx   . Stroke Neg Hx      ROS:  Please see the history of present illness.  All other ROS reviewed and negative.     Physical Exam/Data:   Vitals:   08/01/18 1815 08/01/18 2000 08/01/18 2115 08/01/18 2215  BP: (!) 188/95 (!) 174/66 (!) 174/71 (!) 184/88  Pulse: (!) 34 63 (!) 59 64  Resp: 16 (!) 21 19 17   Temp:      TempSrc:      SpO2: 96% 96% 93% 93%  Weight:      Height:       No intake or output data in the 24 hours ending 08/01/18 2330 Filed Weights   08/01/18 1805  Weight: 77.1 kg   Body mass index is 30.11 kg/m.  General:  Well nourished, well developed, in no acute distress HEENT: normal Neck: JVD at jaw at 60 degrees Cardiac:  normal S1, S2; RRR; no murmur  Lungs:  Crackles at mid-lung fields bilaterally Abd: soft, nontender Ext: 1+ pitting LE edema Musculoskeletal:  No  deformities, BUE and BLE strength normal and equal Skin: warm and dry  Neuro:  No focal abnormalities noted Psych:  Normal affect   EKG:  The EKG was personally reviewed and demonstrates:  sinus rhythm and multiple PVCs with nonspecific ST changes Telemetry:  Telemetry was personally  reviewed and demonstrates:  Sinus with frequent atrial pacing and PVCs.   Relevant CV Studies: Echocardiogram 02/2018: - Left ventricle: The cavity size was normal. There was severe asymmetric hypertrophy. Systolic function was vigorous. The estimated ejection fraction was in the range of 65% to 70%. Wall motion was normal; there were no regional wall motion abnormalities. There was an increased relative contribution of atrial contraction to ventricular filling. Doppler parameters are consistent with abnormal left ventricular relaxation (grade 1 diastolic dysfunction). Doppler parameters are consistent with high ventricular filling pressure. - Aortic valve: Trileaflet; mildly thickened, mildly calcified leaflets. - Mitral valve: Calcified annulus. Valve area by pressure half-time: 1.68 cm^2. Valve area by continuity equation (using LVOT flow): 1.88 cm^2. - Atrial septum: There was increased thickness of the septum, consistent with lipomatous hypertrophy. - Tricuspid valve: There was trivial regurgitation.  Echocardiogram 2017: Ejection Fraction = >55%. The left ventricular wall motion is normal. There is mild mitral regurgitation.  Nuclear Stress Test 2015: Reportedly with mild inferolateral ischemia    Laboratory Data:  Chemistry Recent Labs  Lab 08/01/18 1817  NA 138  K 4.1  CL 98  CO2 28  GLUCOSE 284*  BUN 16  CREATININE 0.82  CALCIUM 8.7*  GFRNONAA 60*  GFRAA >60  ANIONGAP 12    Recent Labs  Lab 08/01/18 1817  PROT 6.0*  ALBUMIN 3.3*  AST 25  ALT 27  ALKPHOS 88  BILITOT 0.8   Hematology Recent Labs  Lab 08/01/18 1817  WBC 9.1  RBC 4.69  HGB 13.7  HCT 42.9  MCV 91.5  MCH 29.2  MCHC 31.9  RDW 15.1  PLT 198   Cardiac EnzymesNo results for input(s): TROPONINI in the last 168 hours.  Recent Labs  Lab 08/01/18 1821 08/01/18 2108  TROPIPOC 0.04 0.20*    BNPNo results for input(s): BNP, PROBNP in the last 168 hours.   DDimer No results for input(s): DDIMER in the last 168 hours.  Radiology/Studies:  Dg Chest Port 1 View  Result Date: 08/01/2018 CLINICAL DATA:  Central chest pain and dyspnea EXAM: PORTABLE CHEST 1 VIEW COMPARISON:  05/11/2018 FINDINGS: Stable cardiomegaly with aortic atherosclerosis. Left-sided pacemaker apparatus with leads in the right atrium and right ventricle appear stable. Trace right effusion with mild interstitial edema similar to prior. No pneumothorax. Degenerative change noted of both shoulders and dorsal spine. IMPRESSION: Stable cardiomegaly with aortic atherosclerosis. Trace right effusion with mild interstitial edema. Findings are similar to that seen on prior exam. Electronically Signed   By: Tollie Ethavid  Kwon M.D.   On: 08/01/2018 18:31    Assessment and Plan:   Elevated troponin Hx CAD The patient presents after a nonspecific episode of chest pain that was relieved with NTG. She is found to have an elevated troponin after initial negative measurement. ECG shows no acute ischemic changes. She has a history of CAD, and NSTEMI is therefore a possible cause of her presenting findings. However, she is severely hypertensive and has signs of hypervolemia on exam, which could also be contributing to her chest discomfort and elevated troponin. Heparin infusion was started in the ED, and it is reasonable to treat for ACS until clinical trajectory better characterized. -Continue to trend troponin -  Continue heparin infusion at this time -S/p ASA 325. Continue 81 mg daily. -Continue home clopidogrel -Reports statin intolerance. May be reasonable to try repeat challenge at some point. -HgA1c, lipid panel ordered -We will follow along to determine type/timing of ischemic evaluation as appropriate.  HFpEF The patient has had hypervolemia/HF exacerbation during prior hospitalization. She has signs of hypervolemia on exam that may be contributing to her symptoms. -IV furosemide ordered. Can  re-dose IV or transition to oral dosing based on clinical response.  HTN SBP elevated to the 180s in the ED. It is unclear if this may be contributing to chest pain. -Continue home amlodipine, lisinopril, metoprolol  -May need further titration  PVCs Known problem for patient.  -Continue metoprolol      For questions or updates, please contact [facility] Please consult [email or web address] for contact info under     Signed, Ernest Mallick, MD  08/01/2018 11:30 PM

## 2018-08-01 NOTE — ED Provider Notes (Signed)
[facility] EMERGENCY DEPARTMENT Provider Note   CSN: [id number] Arrival date & time: 08/01/18  1751     History   Chief Complaint Chief Complaint  Patient presents with  . Chest Pain    HPI Charlene Greene is a 84 y.o. female.  The history is provided by the patient and the EMS personnel.  Chest Pain   This is a new problem. The current episode started less than 1 hour ago. The problem occurs constantly. The problem has been resolved. The pain is associated with exertion. The pain is present in the substernal region. The patient is experiencing no pain. The quality of the pain is described as brief. The pain does not radiate. Associated symptoms include shortness of breath. Pertinent negatives include no abdominal pain, no back pain, no fever, no headaches, no syncope and no vomiting. She has tried nitroglycerin for the symptoms. The treatment provided significant relief. Risk factors include being elderly.  Her past medical history is significant for CAD. Past medical history comments: sick sinus syndrome    Past Medical History:  Diagnosis Date  . CAD (coronary artery disease)   . Diabetes mellitus without complication (HCC)   . Hyperlipidemia   . Hypertension   . Pacemaker   . Renal artery stenosis (HCC)    s/p bilateral stents  . Sick sinus syndrome (HCC)   . Stroke [facility])    2 previous cva's    Patient Active Problem List   Diagnosis Date Noted  . Oxygen dependent 08/02/2018  . Elevated troponin 08/02/2018  . Pressure injury of skin 05/12/2018  . HCAP (healthcare-associated pneumonia) 05/08/2018  . Acute respiratory failure with hypoxia (HCC) 05/07/2018  . Sleep disturbance   . Elevated BUN   . Labile blood pressure   . Essential hypertension   . Dysphasia, post-stroke   . PAD (peripheral artery disease) (HCC)   . Hypoalbuminemia due to protein-calorie malnutrition (HCC)   . Diabetic peripheral neuropathy (HCC)   . Diabetes mellitus type 2 in  nonobese (HCC)   . Chronic diastolic congestive heart failure (HCC)   . Left thalamic infarction (HCC) 02/26/2018  . Dysphagia, post-stroke   . Acute on chronic diastolic congestive heart failure (HCC)   . Type 2 diabetes mellitus with peripheral neuropathy (HCC)   . TIA (transient ischemic attack) 02/22/2018  . Labile blood glucose   . Hypotension due to drugs   . Hyponatremia   . Renovascular hypertension   . Hypertensive crisis   . Type 2 diabetes mellitus with diabetic autonomic neuropathy, without long-term current use of insulin (HCC)   . Stage 3 chronic kidney disease (HCC)   . Acute lower UTI   . Acute renal insufficiency 09/27/2016  . Urinary urgency 09/27/2016  . Basal ganglia infarction (HCC) 09/26/2016  . Neuropathic pain   . Benign essential HTN   . Slow transit constipation   . Elevated creatine kinase   . Accelerated hypertension 09/24/2016  . Diabetes mellitus (HCC) 09/24/2016  . Hyperlipidemia 09/24/2016  . Sick sinus syndrome (HCC) 09/24/2016  . CVA (cerebral vascular accident) (HCC) 09/23/2016  . Stroke (cerebrum) (HCC) 09/23/2016    Past Surgical History:  Procedure Laterality Date  . PACEMAKER PLACEMENT  05/15/2014   MDT Advisa MRI implanted at [facility] for sick sinus syndrome     OB History    Gravida      Para      Term      Preterm      AB  Living  2     SAB      TAB      Ectopic      Multiple      Live Births               Home Medications    Prior to Admission medications   Medication Sig Start Date End Date Taking? Authorizing Provider  acetaminophen (TYLENOL) 500 MG tablet Take 500 mg by mouth See admin instructions. Take 500 mg by mouth two times a day and an additional 500 mg every 12 hours as needed for pain or fever- NOT TO EXCEED 2 "AS NEEDED" TABLETS IN 24 HOURS   Yes [provider]  amLODipine (NORVASC) 5 MG tablet Take 1 tablet (5 mg total) by mouth 2 (two) times daily. 03/13/18  Yes Angiulli, Mcarthur Rossetti, PA-C  Cholecalciferol (VITAMIN D-3) 1000 units CAPS Take 1,000 Units by mouth daily.   Yes [provider]  clopidogrel (PLAVIX) 75 MG tablet Take 1 tablet (75 mg total) by mouth daily. 03/13/18  Yes Angiulli, Mcarthur Rossetti, PA-C  dextromethorphan-guaiFENesin (MUCINEX DM) 30-600 MG 12hr tablet Take 1 tablet by mouth 2 (two) times daily. 05/13/18  Yes Roberto Scales D, MD  feeding supplement, ENSURE ENLIVE, (ENSURE ENLIVE) LIQD Take 237 mLs by mouth 2 (two) times daily between meals. 05/14/18  Yes Roberto Scales D, MD  furosemide (LASIX) 20 MG tablet Take 2 tablets (40 mg total) by mouth daily. Patient taking differently: Take 20 mg by mouth daily.  05/13/18  Yes Roberto Scales D, MD  glimepiride (AMARYL) 1 MG tablet Take 1 tablet (1 mg total) by mouth daily with breakfast. 03/13/18  Yes Angiulli, Mcarthur Rossetti, PA-C  isosorbide mononitrate (IMDUR) 30 MG 24 hr tablet Take 30 mg by mouth daily.   Yes [provider]  lisinopril (PRINIVIL,ZESTRIL) 20 MG tablet Take 1 tablet (20 mg total) by mouth 2 (two) times daily. 03/13/18  Yes Angiulli, Mcarthur Rossetti, PA-C  Magnesium 250 MG TABS Take 250 mg by mouth at bedtime.   Yes [provider]  metoprolol succinate (TOPROL-XL) 100 MG 24 hr tablet Take 1 tablet (100 mg total) by mouth daily. 03/13/18  Yes Angiulli, Mcarthur Rossetti, PA-C  Multiple Vitamin (MULTIVITAMIN WITH MINERALS) TABS tablet Take 1 tablet by mouth daily. 05/14/18  Yes Roberto Scales D, MD  nitroGLYCERIN (NITROSTAT) 0.4 MG SL tablet Place 0.4 mg under the tongue every 5 (five) minutes x 3 doses as needed for chest pain (and call 9-1-1 and MD, if no relief).    Yes [provider]  ranolazine (RANEXA) 500 MG 12 hr tablet Take 1 tablet (500 mg total) by mouth 2 (two) times daily. 10/11/16  Yes Angiulli, Mcarthur Rossetti, PA-C  senna (SENOKOT) 8.6 MG TABS tablet Take 1 tablet by mouth daily as needed for mild constipation (and not to exceed 1 tablet/24 hours).    Yes [provider]    triamterene-hydrochlorothiazide Joseph Pierini) 37.5-25 MG tablet Take 0.5 tablets by mouth every Monday, Wednesday, and Friday.  05/07/18 08/01/18 Yes [provider]  Ipratropium-Albuterol (COMBIVENT) 20-100 MCG/ACT AERS respimat Inhale 2 puffs into the lungs 2 (two) times daily. For 2 weeks 05/06/18 08/01/18  [provider]  isosorbide mononitrate (IMDUR) 60 MG 24 hr tablet Take 1 tablet (60 mg total) by mouth daily. Patient not taking: Reported on 08/01/2018 10/11/16   Angiulli, Mcarthur Rossetti, PA-C    Family History Family History  Problem Relation Age of Onset  . Lung  cancer Sister   . Hypertension Other   . CAD Neg Hx   . Stroke Neg Hx     Social History Social History   Tobacco Use  . Smoking status: Never Smoker  . Smokeless tobacco: Never Used  Substance Use Topics  . Alcohol use: No  . Drug use: Never     Allergies   Statins; Other; Amlodipine; Celecoxib; Codeine; Naproxen; and Piroxicam   Review of Systems Review of Systems  Constitutional: Negative for chills and fever.  HENT: Positive for rhinorrhea. Negative for congestion.   Eyes: Negative for pain.  Respiratory: Positive for shortness of breath.   Cardiovascular: Positive for chest pain. Negative for syncope.  Gastrointestinal: Negative for abdominal pain and vomiting.  Genitourinary: Negative for dysuria.  Musculoskeletal: Negative for back pain.  Skin: Negative for wound.  Neurological: Negative for headaches.     Physical Exam Updated Vital Signs BP (!) 178/81 (BP Location: Left Arm)   Pulse 70   Temp 98.4 F (36.9 C) (Oral)   Resp 20   Ht 5\' 5"  (1.651 m)   Wt 77.1 kg   SpO2 93%   BMI 28.29 kg/m   Physical Exam  Constitutional: She appears well-developed and well-nourished. No distress.  HENT:  Head: Normocephalic and atraumatic.  Eyes: Conjunctivae are normal.  Neck: Neck supple. No JVD present.  Cardiovascular: Normal rate and regular rhythm.  No murmur heard. Pulses:       Radial pulses are 2+ on the right side, and 2+ on the left side.       Dorsalis pedis pulses are 2+ on the right side, and 2+ on the left side.  Pulmonary/Chest: Effort normal and breath sounds normal. No respiratory distress. She has no decreased breath sounds. She has no rales.  Abdominal: Soft. She exhibits no distension. There is no tenderness.  Musculoskeletal:       Right lower leg: She exhibits edema. She exhibits no tenderness.       Left lower leg: She exhibits edema. She exhibits no tenderness.  Neurological: She is alert.  Skin: Skin is warm and dry.  Psychiatric: She has a normal mood and affect.  Nursing note and vitals reviewed.    ED Treatments / Results  Labs (all labs ordered are listed, but only abnormal results are displayed) Labs Reviewed  COMPREHENSIVE METABOLIC PANEL - Abnormal; Notable for the following components:      Result Value   Glucose, Bld 284 (*)    Calcium 8.7 (*)    Total Protein 6.0 (*)    Albumin 3.3 (*)    GFR calc non Af Amer 60 (*)    All other components within normal limits  BRAIN NATRIURETIC PEPTIDE - Abnormal; Notable for the following components:   B Natriuretic Peptide 900.5 (*)    All other components within normal limits  HEMOGLOBIN A1C - Abnormal; Notable for the following components:   Hgb A1c MFr Bld 6.9 (*)    All other components within normal limits  LIPID PANEL - Abnormal; Notable for the following components:   Cholesterol 268 (*)    Triglycerides 151 (*)    LDL Cholesterol 190 (*)    All other components within normal limits  BASIC METABOLIC PANEL - Abnormal; Notable for the following components:   Glucose, Bld 194 (*)    Calcium 8.6 (*)    All other components within normal limits  TROPONIN I - Abnormal; Notable for the following components:   Troponin I 0.23 (*)  All other components within normal limits  TROPONIN I - Abnormal; Notable for the following components:   Troponin I 0.19 (*)    All other components  within normal limits  HEPARIN LEVEL (UNFRACTIONATED) - Abnormal; Notable for the following components:   Heparin Unfractionated 0.13 (*)    All other components within normal limits  GLUCOSE, CAPILLARY - Abnormal; Notable for the following components:   Glucose-Capillary 175 (*)    All other components within normal limits  GLUCOSE, CAPILLARY - Abnormal; Notable for the following components:   Glucose-Capillary 181 (*)    All other components within normal limits  I-STAT TROPONIN, ED - Abnormal; Notable for the following components:   Troponin i, poc 0.20 (*)    All other components within normal limits  CBC WITH DIFFERENTIAL/PLATELET  CBC  HEPARIN LEVEL (UNFRACTIONATED)  TROPONIN I  HEPARIN LEVEL (UNFRACTIONATED)  I-STAT TROPONIN, ED    EKG EKG Interpretation  Date/Time:  Friday August 01 2018 17:57:53 EDT Ventricular Rate:  75 PR Interval:    QRS Duration: 104 QT Interval:  432 QTC Calculation: 417 R Axis:   -35 Text Interpretation:  Sinus rhythm Ventricular bigeminy LVH with secondary repolarization abnormality Baseline wander in lead(s) I II aVR aVF PVc new since previous Confirmed by Richardean Canal [phone]) on 08/01/2018 6:04:28 PM   Radiology Dg Chest Port 1 View  Result Date: 08/01/2018 CLINICAL DATA:  Central chest pain and dyspnea EXAM: PORTABLE CHEST 1 VIEW COMPARISON:  05/11/2018 FINDINGS: Stable cardiomegaly with aortic atherosclerosis. Left-sided pacemaker apparatus with leads in the right atrium and right ventricle appear stable. Trace right effusion with mild interstitial edema similar to prior. No pneumothorax. Degenerative change noted of both shoulders and dorsal spine. IMPRESSION: Stable cardiomegaly with aortic atherosclerosis. Trace right effusion with mild interstitial edema. Findings are similar to that seen on prior exam. Electronically Signed   By: Tollie Eth M.D.   On: 08/01/2018 18:31    Procedures Procedures (including critical care  time)  Medications Ordered in ED Medications  heparin ADULT infusion 100 units/mL (25000 units/252mL sodium chloride 0.45%) (1,000 Units/hr Intravenous Rate/Dose Change 08/02/18 1230)  amLODipine (NORVASC) tablet 5 mg (5 mg Oral Given 08/02/18 0925)  clopidogrel (PLAVIX) tablet 75 mg (75 mg Oral Given 08/02/18 0925)  furosemide (LASIX) tablet 20 mg (20 mg Oral Given 08/02/18 0925)  lisinopril (PRINIVIL,ZESTRIL) tablet 20 mg (20 mg Oral Given 08/02/18 0924)  metoprolol succinate (TOPROL-XL) 24 hr tablet 100 mg (100 mg Oral Given 08/02/18 0925)  ranolazine (RANEXA) 12 hr tablet 500 mg (500 mg Oral Given 08/02/18 0924)  senna (SENOKOT) tablet 8.6 mg (has no administration in time range)  aspirin chewable tablet 81 mg (81 mg Oral Given 08/02/18 0925)  insulin aspart (novoLOG) injection 0-5 Units (has no administration in time range)  insulin aspart (novoLOG) injection 0-15 Units (3 Units Subcutaneous Given 08/02/18 1235)  hydrALAZINE (APRESOLINE) injection 5 mg (5 mg Intravenous Given 08/02/18 1238)  aspirin tablet 325 mg (325 mg Oral Given 08/01/18 1813)  heparin bolus via infusion 4,000 Units (4,000 Units Intravenous Bolus from Bag 08/02/18 0007)  furosemide (LASIX) injection 40 mg (40 mg Intravenous Given 08/02/18 0000)     Initial Impression / Assessment and Plan / ED Course  I have reviewed the triage vital signs and the nursing notes.  Pertinent labs & imaging results that were available during my care of the patient were reviewed by me and considered in my medical decision making (see chart for details).  Patient is a 84 year old female with past medical history of CAD with chest pain.  The patient reports that the pain began while she was on an excursion and resolved completely after she took nitro.  The patient is asymptomatic at this time.  While EMS was transporting her to the emergency department, they were concerned that she had an episode of V. tach.  They gave the patient and amiodarone  bolus.  The patient's [employer] pacemaker was interrogated in the emergency department and she was not found to have any episodes of V. Tach, rather she had a paced rhythm.  The patient's lab work was significant for an uptrending troponin.  Labs and chest x-ray showed no signs of infection or pneumonia.  Do not suspect that the patient is having an infection, esophageal perforation, or aortic dissection based on the patient's history, physical, and testing.  The patient was started on a heparin drip for her NSTEMI.  Cardiology was consulted and evaluated the patient in the emergency department.  They recommended the patient be admitted to medicine.  Medicine evaluated the patient and agreed to admit.  Patient care supervised by Dr. Silverio Lay.  Nash Dimmer, MD   Final Clinical Impressions(s) / ED Diagnoses   Final diagnoses:  None    ED Discharge Orders    None       Nash Dimmer, MD 08/02/18 1308    Charlynne Pander, MD 08/02/18 1710

## 2018-08-01 NOTE — ED Notes (Signed)
Dr Silverio LayYao informed of troponin results .20

## 2018-08-01 NOTE — ED Notes (Signed)
Pacemaker interrogated. 

## 2018-08-01 NOTE — ED Triage Notes (Addendum)
Per EMS [facility] and went on a day trip for donuts.  On the way back she developed central CP.  When she got back  (15min later) she had some SOB was given 1 nitro at 1607 and then they called EMS. Pt was given 150mg  of Amiodarone in route to [facility]   Pt does have a pace maker and EMS thinks she may have had a run of VTACH.  She wears O2 PRN NAD noted at this time.  AOx4

## 2018-08-01 NOTE — Progress Notes (Signed)
ANTICOAGULATION CONSULT NOTE - Initial Consult  Pharmacy Consult for heparin Indication: chest pain/ACS  Allergies  Allergen Reactions  . Statins Other (See Comments)    Muscle weakness/pain  . Other Other (See Comments)    Reductase Inhibitors- "Allergic," per MAR/reaction not recalled by the patient "Dye"- "Allergic," per MAR/reaction not recalled by the patient  . Amlodipine Other (See Comments)    Unknown   . Celecoxib Nausea Only and Other (See Comments)  . Codeine Other (See Comments)    "Allergic," per MAR/reaction not recalled by the patient  . Naproxen Other (See Comments)    Unknown   . Piroxicam Other (See Comments)    "Allergic," per MAR/reaction not recalled by the patient    Patient Measurements: Height: 5\' 3"  (160 cm) Weight: 170 lb (77.1 kg) IBW/kg (Calculated) : 52.4 Heparin Dosing Weight: 69 kg  Vital Signs: Temp: 97.7 F (36.5 C) (09/20 1803) Temp Source: Oral (09/20 1803) BP: 184/88 (09/20 2215) Pulse Rate: 64 (09/20 2215)  Labs: Recent Labs    08/01/18 1817  HGB 13.7  HCT 42.9  PLT 198  CREATININE 0.82    Estimated Creatinine Clearance: 42.2 mL/min (by C-G formula based on SCr of 0.82 mg/dL).  Assessment: CC/HPI: 84 yo m presenting after getting cp from getting donuts  Anticoag: none pta iv hep for r/o acs  Renal: SCr 0.82  Heme/Onc: H&H 13.7/42.9, PLt 198  Goal of Therapy:  Heparin level 0.3-0.7 units/ml Monitor platelets by anticoagulation protocol: Yes   Plan:  Heparin 4000 units x 1 Heparin gtt 800 units/hr Daily HL CBC  Isaac BlissMichael Mellie Buccellato, PharmD, BCPS, BCCCP Clinical Pharmacist [phone]  Please check [facility]ION for all [facility] numbers  08/01/2018 10:50 PM

## 2018-08-02 DIAGNOSIS — Z8673 Personal history of transient ischemic attack (TIA), and cerebral infarction without residual deficits: Secondary | ICD-10-CM | POA: Diagnosis not present

## 2018-08-02 DIAGNOSIS — Z950 Presence of cardiac pacemaker: Secondary | ICD-10-CM | POA: Diagnosis not present

## 2018-08-02 DIAGNOSIS — I21A1 Myocardial infarction type 2: Secondary | ICD-10-CM | POA: Diagnosis present

## 2018-08-02 DIAGNOSIS — E1149 Type 2 diabetes mellitus with other diabetic neurological complication: Secondary | ICD-10-CM | POA: Diagnosis not present

## 2018-08-02 DIAGNOSIS — Z9981 Dependence on supplemental oxygen: Secondary | ICD-10-CM | POA: Diagnosis not present

## 2018-08-02 DIAGNOSIS — Z66 Do not resuscitate: Secondary | ICD-10-CM | POA: Diagnosis present

## 2018-08-02 DIAGNOSIS — J9811 Atelectasis: Secondary | ICD-10-CM | POA: Diagnosis present

## 2018-08-02 DIAGNOSIS — Z960 Presence of urogenital implants: Secondary | ICD-10-CM | POA: Diagnosis present

## 2018-08-02 DIAGNOSIS — Z888 Allergy status to other drugs, medicaments and biological substances status: Secondary | ICD-10-CM | POA: Diagnosis not present

## 2018-08-02 DIAGNOSIS — Z7902 Long term (current) use of antithrombotics/antiplatelets: Secondary | ICD-10-CM | POA: Diagnosis not present

## 2018-08-02 DIAGNOSIS — Z7984 Long term (current) use of oral hypoglycemic drugs: Secondary | ICD-10-CM | POA: Diagnosis not present

## 2018-08-02 DIAGNOSIS — I472 Ventricular tachycardia: Secondary | ICD-10-CM | POA: Diagnosis present

## 2018-08-02 DIAGNOSIS — I6300 Cerebral infarction due to thrombosis of unspecified precerebral artery: Secondary | ICD-10-CM | POA: Diagnosis not present

## 2018-08-02 DIAGNOSIS — Z886 Allergy status to analgesic agent status: Secondary | ICD-10-CM | POA: Diagnosis not present

## 2018-08-02 DIAGNOSIS — I701 Atherosclerosis of renal artery: Secondary | ICD-10-CM | POA: Diagnosis present

## 2018-08-02 DIAGNOSIS — E1122 Type 2 diabetes mellitus with diabetic chronic kidney disease: Secondary | ICD-10-CM | POA: Diagnosis present

## 2018-08-02 DIAGNOSIS — N183 Chronic kidney disease, stage 3 (moderate): Secondary | ICD-10-CM | POA: Diagnosis present

## 2018-08-02 DIAGNOSIS — I248 Other forms of acute ischemic heart disease: Secondary | ICD-10-CM | POA: Diagnosis not present

## 2018-08-02 DIAGNOSIS — I5033 Acute on chronic diastolic (congestive) heart failure: Secondary | ICD-10-CM | POA: Diagnosis present

## 2018-08-02 DIAGNOSIS — I493 Ventricular premature depolarization: Secondary | ICD-10-CM | POA: Diagnosis present

## 2018-08-02 DIAGNOSIS — I214 Non-ST elevation (NSTEMI) myocardial infarction: Secondary | ICD-10-CM | POA: Diagnosis present

## 2018-08-02 DIAGNOSIS — R748 Abnormal levels of other serum enzymes: Secondary | ICD-10-CM | POA: Diagnosis not present

## 2018-08-02 DIAGNOSIS — Z91048 Other nonmedicinal substance allergy status: Secondary | ICD-10-CM | POA: Diagnosis not present

## 2018-08-02 DIAGNOSIS — I130 Hypertensive heart and chronic kidney disease with heart failure and stage 1 through stage 4 chronic kidney disease, or unspecified chronic kidney disease: Secondary | ICD-10-CM | POA: Diagnosis present

## 2018-08-02 DIAGNOSIS — I10 Essential (primary) hypertension: Secondary | ICD-10-CM | POA: Diagnosis not present

## 2018-08-02 DIAGNOSIS — I252 Old myocardial infarction: Secondary | ICD-10-CM | POA: Diagnosis not present

## 2018-08-02 DIAGNOSIS — R0902 Hypoxemia: Secondary | ICD-10-CM | POA: Diagnosis present

## 2018-08-02 DIAGNOSIS — Z885 Allergy status to narcotic agent status: Secondary | ICD-10-CM | POA: Diagnosis not present

## 2018-08-02 DIAGNOSIS — I2510 Atherosclerotic heart disease of native coronary artery without angina pectoris: Secondary | ICD-10-CM | POA: Diagnosis present

## 2018-08-02 DIAGNOSIS — I158 Other secondary hypertension: Secondary | ICD-10-CM | POA: Diagnosis present

## 2018-08-02 DIAGNOSIS — R7989 Other specified abnormal findings of blood chemistry: Secondary | ICD-10-CM | POA: Diagnosis present

## 2018-08-02 DIAGNOSIS — I5031 Acute diastolic (congestive) heart failure: Secondary | ICD-10-CM | POA: Diagnosis not present

## 2018-08-02 DIAGNOSIS — R778 Other specified abnormalities of plasma proteins: Secondary | ICD-10-CM | POA: Diagnosis present

## 2018-08-02 LAB — BASIC METABOLIC PANEL
ANION GAP: 13 (ref 5–15)
BUN: 13 mg/dL (ref 8–23)
CALCIUM: 8.6 mg/dL — AB (ref 8.9–10.3)
CHLORIDE: 98 mmol/L (ref 98–111)
CO2: 28 mmol/L (ref 22–32)
Creatinine, Ser: 0.75 mg/dL (ref 0.44–1.00)
GFR calc non Af Amer: >60 mL/min (ref >60)
GLUCOSE: 194 mg/dL — AB (ref 70–99)
Potassium: 3.7 mmol/L (ref 3.5–5.1)
Sodium: 139 mmol/L (ref 135–145)

## 2018-08-02 LAB — GLUCOSE, CAPILLARY
GLUCOSE-CAPILLARY: 109 mg/dL — AB (ref 70–99)
GLUCOSE-CAPILLARY: 174 mg/dL — AB (ref 70–99)
Glucose-Capillary: 175 mg/dL — ABNORMAL HIGH (ref 70–99)
Glucose-Capillary: 181 mg/dL — ABNORMAL HIGH (ref 70–99)
Glucose-Capillary: 138 mg/dL — ABNORMAL HIGH (ref 70–99)

## 2018-08-02 LAB — HEMOGLOBIN A1C
Hgb A1c MFr Bld: 6.9 % — ABNORMAL HIGH (ref 4.8–5.6)
Mean Plasma Glucose: 151.33 mg/dL

## 2018-08-02 LAB — TROPONIN I
Troponin I: 0.23 ng/mL (ref <0.03)
Troponin I: 0.19 ng/mL (ref <0.03)
Troponin I: 0.18 ng/mL (ref <0.03)

## 2018-08-02 LAB — CBC
HCT: 41.4 % (ref 36.0–46.0)
Hemoglobin: 13.3 g/dL (ref 12.0–15.0)
MCH: 29.1 pg (ref 26.0–34.0)
MCHC: 32.1 g/dL (ref 30.0–36.0)
MCV: 90.6 fL (ref 78.0–100.0)
Platelets: 195 10*3/uL (ref 150–400)
RBC: 4.57 MIL/uL (ref 3.87–5.11)
RDW: 14.8 % (ref 11.5–15.5)
WBC: 10.3 10*3/uL (ref 4.0–10.5)

## 2018-08-02 LAB — HEPARIN LEVEL (UNFRACTIONATED)
Heparin Unfractionated: 0.45 IU/mL (ref 0.30–0.70)
Heparin Unfractionated: 0.13 IU/mL — ABNORMAL LOW (ref 0.30–0.70)
Heparin Unfractionated: <0.1 IU/mL — ABNORMAL LOW (ref 0.30–0.70)

## 2018-08-02 LAB — LIPID PANEL
Cholesterol: 268 mg/dL — ABNORMAL HIGH (ref 0–200)
HDL: 48 mg/dL (ref >40)
LDL Cholesterol: 190 mg/dL — ABNORMAL HIGH (ref 0–99)
Total CHOL/HDL Ratio: 5.6 RATIO
Triglycerides: 151 mg/dL — ABNORMAL HIGH (ref <150)
VLDL: 30 mg/dL (ref 0–40)

## 2018-08-02 LAB — BRAIN NATRIURETIC PEPTIDE: B Natriuretic Peptide: 900.5 pg/mL — ABNORMAL HIGH (ref 0.0–100.0)

## 2018-08-02 MED ORDER — INSULIN ASPART 100 UNIT/ML ~~LOC~~ SOLN
0.0000 [IU] | Freq: Three times a day (TID) | SUBCUTANEOUS | Status: DC
  Administered 2018-08-02: 3 [IU] via SUBCUTANEOUS
  Administered 2018-08-03: 2 [IU] via SUBCUTANEOUS
  Administered 2018-08-03: 5 [IU] via SUBCUTANEOUS
  Administered 2018-08-04: 2 [IU] via SUBCUTANEOUS
  Administered 2018-08-04: 5 [IU] via SUBCUTANEOUS
  Administered 2018-08-04: 2 [IU] via SUBCUTANEOUS
  Administered 2018-08-05 (×3): 3 [IU] via SUBCUTANEOUS
  Administered 2018-08-06: 2 [IU] via SUBCUTANEOUS
  Administered 2018-08-06 – 2018-08-07 (×3): 3 [IU] via SUBCUTANEOUS
  Administered 2018-08-07: 5 [IU] via SUBCUTANEOUS

## 2018-08-02 MED ORDER — INSULIN ASPART 100 UNIT/ML ~~LOC~~ SOLN: 0.0000 [IU] | Freq: Every day | SUBCUTANEOUS | Status: DC

## 2018-08-02 MED ORDER — HYDRALAZINE HCL 20 MG/ML IJ SOLN
5.0000 mg | Freq: Four times a day (QID) | INTRAMUSCULAR | Status: DC | PRN
  Administered 2018-08-02 – 2018-08-04 (×5): 5 mg via INTRAVENOUS
  Filled 2018-08-02 (×4): qty 1

## 2018-08-02 NOTE — Progress Notes (Addendum)
ANTICOAGULATION CONSULT NOTE  Pharmacy Consult for heparin Indication: chest pain/ACS  Allergies  Allergen Reactions  . Statins Other (See Comments)    Muscle weakness/pain  . Other Other (See Comments)    Reductase Inhibitors- "Allergic," per MAR/reaction not recalled by the patient "Dye"- "Allergic," per MAR/reaction not recalled by the patient  . Amlodipine Other (See Comments)    Unknown   . Celecoxib Nausea Only and Other (See Comments)  . Codeine Other (See Comments)    "Allergic," per MAR/reaction not recalled by the patient  . Naproxen Other (See Comments)    Unknown   . Piroxicam Other (See Comments)    "Allergic," per MAR/reaction not recalled by the patient    Patient Measurements: Height: 5\' 5"  (165.1 cm) Weight: 170 lb (77.1 kg) IBW/kg (Calculated) : 57 Heparin Dosing Weight: 69 kg  Vital Signs: Temp: 98.4 F (36.9 C) (09/21 0903) Temp Source: Oral (09/21 0903) BP: 183/94 (09/21 0903) Pulse Rate: 70 (09/21 0903)  Labs: Recent Labs    08/01/18 1817 08/02/18 0315 08/02/18 0848  HGB 13.7 13.3  --   HCT 42.9 41.4  --   PLT 198 195  --   HEPARINUNFRC  --  0.45 0.13*  CREATININE 0.82 0.75  --   TROPONINI  --  0.23* 0.19*    Estimated Creatinine Clearance: 45.1 mL/min (by C-G formula based on SCr of 0.75 mg/dL).  Assessment: Charlene Greene presenting with CP, continuing on heparin for r/o ACS. Confirmatory heparin level now low at 0.13. CBC wnl. No bleed or issues with infusion per discussion with RN and drip has not been off at all. Cardiology trending troponins for now.  Goal of Therapy:  Heparin level 0.3-0.7 units/ml Monitor platelets by anticoagulation protocol: Yes   Plan:  Increase heparin to 1000 units/hr 8h heparin level Monitor daily heparin level and CBC, s/sx bleeding F/u Cardiology plans  Babs BertinHaley Gevon Markus, PharmD, BCPS Clinical Pharmacist Clinical phone [phone] Please check AMION for all [facility] contact numbers 08/02/2018 11:06 AM

## 2018-08-02 NOTE — Progress Notes (Signed)
ANTICOAGULATION CONSULT NOTE  Pharmacy Consult for heparin Indication: chest pain/ACS  Allergies  Allergen Reactions  . Statins Other (See Comments)    Muscle weakness/pain  . Other Other (See Comments)    Reductase Inhibitors- "Allergic," per MAR/reaction not recalled by the patient "Dye"- "Allergic," per MAR/reaction not recalled by the patient  . Amlodipine Other (See Comments)    Unknown   . Celecoxib Nausea Only and Other (See Comments)  . Codeine Other (See Comments)    "Allergic," per MAR/reaction not recalled by the patient  . Naproxen Other (See Comments)    Unknown   . Piroxicam Other (See Comments)    "Allergic," per MAR/reaction not recalled by the patient    Patient Measurements: Height: 5\' 5"  (165.1 cm) Weight: 170 lb (77.1 kg) IBW/kg (Calculated) : 57 Heparin Dosing Weight: 69 kg  Vital Signs: Temp: 98.9 F (37.2 C) (09/21 1947) Temp Source: Oral (09/21 1947) BP: 173/62 (09/21 1947) Pulse Rate: 33 (09/21 1800)  Labs: Recent Labs    08/01/18 1817 08/02/18 0315 08/02/18 0848 08/02/18 1351 08/02/18 1930  HGB 13.7 13.3  --   --   --   HCT 42.9 41.4  --   --   --   PLT 198 195  --   --   --   HEPARINUNFRC  --  0.45 0.13*  --  <0.10*  CREATININE 0.82 0.75  --   --   --   TROPONINI  --  0.23* 0.19* 0.18*  --     Estimated Creatinine Clearance: 45.1 mL/min (by C-G formula based on SCr of 0.75 mg/dL).  Assessment: [age] yom presenting with CP, continuing on heparin for r/o ACS. Confirmatory heparin level now low at 0.13. CBC wnl. No bleed or issues with infusion per discussion with RN and drip has not been off at all. Cardiology trending troponins for now.  Goal of Therapy:  Heparin level 0.3-0.7 units/ml Monitor platelets by anticoagulation protocol: Yes   Plan:  Increase heparin to 1000 units/hr 8h heparin level Monitor daily heparin level and CBC, s/sx bleeding F/u Cardiology plans  Babs BertinHaley Morgann Woodburn, PharmD, BCPS Clinical Pharmacist Clinical  phone [phone] Please check AMION for all [facility] contact numbers 08/02/2018 11:06 AM  ADDENDUM: Heparin level now undetectable after rate increase. Per discussion with RN, she was informed patient's pump was beeping often earlier, but she is not aware of the drip being off at all. She did note the pt had some bleeding upon heparin level being drawn but has resolved. States the pump has been running fine since the start of her shift so far.  Plan: Increase heparin to 1200 units/hr 8h heparin level Monitor daily heparin level and CBC, s/sx bleeding  Babs BertinHaley Waldemar Siegel, PharmD, BCPS Clinical Pharmacist Please check AMION for all [facility] contact numbers 08/02/2018 8:19 PM

## 2018-08-02 NOTE — ED Notes (Signed)
Placed pt on PW. 

## 2018-08-02 NOTE — H&P (Addendum)
History and Physical    [name] ZOX:[id number] DOB: 1922-05-10 DOA: 08/01/2018  PCP: [facility]  Patient coming from: ALF   Chief Complaint: chest pain  HPI: Charlene Greene is a 84 y.o. female with medical history significant for CVA (including recent), sick sinus s/p pacemaker placement, RAS s/p stenting, HTN, DM, CKD, home O2 (2L), who presents w/ above.  Was on a group outing from her ALF, riding in a bus today, when acutely developed substernal chest pain/pressure. Persisted when arrived back at her ALF, when she notified nursing, who alerted EMS. EMS gave nitro and amiodarone (appears mistakenly diagnosed v tach) as well as asa. On arrival chest pain free. No cough or sob. No fevers. No history MI or cardiac stenting. Did have a nuclear stress in 2015 that showed mild ischemia. TTE 02/2018 showed normal ef w/ grade 1 diastolic dysfunction. She has LE edema which she thinks is at baseline. She has no new shortness of breath.  Several recent hospitalizations: CVA in 2017, again 02/2018, and admission 05/2018 for cap/chf.   ED Course: initial troponin wnl, second troponin 0.2. Cardiology consulted, heparin drip ordered. Home bp medications ordered.  Review of Systems: As per HPI otherwise 10 point review of systems negative.    Past Medical History:  Diagnosis Date  . CAD (coronary artery disease)   . Diabetes mellitus without complication (HCC)   . Hyperlipidemia   . Hypertension   . Pacemaker   . Renal artery stenosis (HCC)    s/p bilateral stents  . Sick sinus syndrome (HCC)   . Stroke [facility])    2 previous cva's    Past Surgical History:  Procedure Laterality Date  . PACEMAKER PLACEMENT  05/15/2014   MDT Advisa MRI implanted at [facility] for sick sinus syndrome     reports that she has never smoked. She has never used smokeless tobacco. She reports that she does not drink alcohol or use drugs.  Allergies  Allergen Reactions  . Statins Other (See  Comments)    Muscle weakness/pain  . Other Other (See Comments)    Reductase Inhibitors- "Allergic," per MAR/reaction not recalled by the patient "Dye"- "Allergic," per MAR/reaction not recalled by the patient  . Amlodipine Other (See Comments)    Unknown   . Celecoxib Nausea Only and Other (See Comments)  . Codeine Other (See Comments)    "Allergic," per MAR/reaction not recalled by the patient  . Naproxen Other (See Comments)    Unknown   . Piroxicam Other (See Comments)    "Allergic," per MAR/reaction not recalled by the patient    Family History  Problem Relation Age of Onset  . Lung cancer Sister   . Hypertension Other   . CAD Neg Hx   . Stroke Neg Hx     Prior to Admission medications   Medication Sig Start Date End Date Taking? Authorizing Provider  acetaminophen (TYLENOL) 500 MG tablet Take 500 mg by mouth See admin instructions. Take 500 mg by mouth two times a day and an additional 500 mg every 12 hours as needed for pain or fever- NOT TO EXCEED 2 "AS NEEDED" TABLETS IN 24 HOURS   Yes [provider]  amLODipine (NORVASC) 5 MG tablet Take 1 tablet (5 mg total) by mouth 2 (two) times daily. 03/13/18  Yes Angiulli, Mcarthur Rossetti, PA-C  Cholecalciferol (VITAMIN D-3) 1000 units CAPS Take 1,000 Units by mouth daily.   Yes [provider]  clopidogrel (PLAVIX) 75 MG tablet Take  1 tablet (75 mg total) by mouth daily. 03/13/18  Yes Angiulli, Mcarthur Rossettianiel J, PA-C  dextromethorphan-guaiFENesin (MUCINEX DM) 30-600 MG 12hr tablet Take 1 tablet by mouth 2 (two) times daily. 05/13/18  Yes Roberto ScalesNettey, Shayla D, MD  feeding supplement, ENSURE ENLIVE, (ENSURE ENLIVE) LIQD Take 237 mLs by mouth 2 (two) times daily between meals. 05/14/18  Yes Roberto ScalesNettey, Shayla D, MD  furosemide (LASIX) 20 MG tablet Take 2 tablets (40 mg total) by mouth daily. Patient taking differently: Take 20 mg by mouth daily.  05/13/18  Yes Roberto ScalesNettey, Shayla D, MD  glimepiride (AMARYL) 1 MG tablet Take 1 tablet (1 mg total) by  mouth daily with breakfast. 03/13/18  Yes Angiulli, Mcarthur Rossettianiel J, PA-C  isosorbide mononitrate (IMDUR) 30 MG 24 hr tablet Take 30 mg by mouth daily.   Yes [provider]  lisinopril (PRINIVIL,ZESTRIL) 20 MG tablet Take 1 tablet (20 mg total) by mouth 2 (two) times daily. 03/13/18  Yes Angiulli, Mcarthur Rossettianiel J, PA-C  Magnesium 250 MG TABS Take 250 mg by mouth at bedtime.   Yes [provider]  metoprolol succinate (TOPROL-XL) 100 MG 24 hr tablet Take 1 tablet (100 mg total) by mouth daily. 03/13/18  Yes Angiulli, Mcarthur Rossettianiel J, PA-C  Multiple Vitamin (MULTIVITAMIN WITH MINERALS) TABS tablet Take 1 tablet by mouth daily. 05/14/18  Yes Roberto ScalesNettey, Shayla D, MD  nitroGLYCERIN (NITROSTAT) 0.4 MG SL tablet Place 0.4 mg under the tongue every 5 (five) minutes x 3 doses as needed for chest pain (and call 9-1-1 and MD, if no relief).    Yes [provider]  ranolazine (RANEXA) 500 MG 12 hr tablet Take 1 tablet (500 mg total) by mouth 2 (two) times daily. 10/11/16  Yes Angiulli, Mcarthur Rossettianiel J, PA-C  senna (SENOKOT) 8.6 MG TABS tablet Take 1 tablet by mouth daily as needed for mild constipation (and not to exceed 1 tablet/24 hours).    Yes [provider]  triamterene-hydrochlorothiazide Joseph Pierini(MAXZIDE-25) 37.5-25 MG tablet Take 0.5 tablets by mouth every Monday, Wednesday, and Friday.  05/07/18 08/01/18 Yes [provider]  Ipratropium-Albuterol (COMBIVENT) 20-100 MCG/ACT AERS respimat Inhale 2 puffs into the lungs 2 (two) times daily. For 2 weeks 05/06/18 08/01/18  [provider]  isosorbide mononitrate (IMDUR) 60 MG 24 hr tablet Take 1 tablet (60 mg total) by mouth daily. Patient not taking: Reported on 08/01/2018 10/11/16   Charlton Amorngiulli, Daniel J, PA-C    Physical Exam: Vitals:   08/01/18 2000 08/01/18 2115 08/01/18 2215 08/01/18 2330  BP: (!) 174/66 (!) 174/71 (!) 184/88 (!) 170/62  Pulse: 63 (!) 59 64 62  Resp: (!) 21 19 17  (!) 22  Temp:      TempSrc:      SpO2: 96% 93% 93% 96%  Weight:       Height:        Constitutional: No acute distress Head: Atraumatic Eyes: Conjunctiva clear ENM: Moist mucous membranes. Normal dentition.  Neck: Supple Respiratory: Clear to auscultation bilaterally, no wheezing/rales/rhonchi save for mild rales at bases. Normal respiratory effort. No accessory muscle use. . Cardiovascular: Regular rate and rhythm. Distant heart sounds. Soft systolic murmur Abdomen: Non-tender, non-distended. No masses. No rebound or guarding. Positive bowel sounds. Musculoskeletal: No joint deformity upper and lower extremities. Normal ROM, no contractures. Normal muscle tone.  Skin: No rashes, lesions, or ulcers.  Extremities: mod pitting edema LEs to below the knees. Palpable peripheral pulses. Neurologic: Alert, moving all 4 extremities. Psychiatric: Normal insight and judgement.   Labs on Admission: I  have personally reviewed following labs and imaging studies  CBC: Recent Labs  Lab 08/01/18 1817  WBC 9.1  NEUTROABS 7.6  HGB 13.7  HCT 42.9  MCV 91.5  PLT 198   Basic Metabolic Panel: Recent Labs  Lab 08/01/18 1817  NA 138  K 4.1  CL 98  CO2 28  GLUCOSE 284*  BUN 16  CREATININE 0.82  CALCIUM 8.7*   GFR: Estimated Creatinine Clearance: 42.2 mL/min (by C-G formula based on SCr of 0.82 mg/dL). Liver Function Tests: Recent Labs  Lab 08/01/18 1817  AST 25  ALT 27  ALKPHOS 88  BILITOT 0.8  PROT 6.0*  ALBUMIN 3.3*   No results for input(s): LIPASE, AMYLASE in the last 168 hours. No results for input(s): AMMONIA in the last 168 hours. Coagulation Profile: No results for input(s): INR, PROTIME in the last 168 hours. Cardiac Enzymes: No results for input(s): CKTOTAL, CKMB, CKMBINDEX, TROPONINI in the last 168 hours. BNP (last 3 results) No results for input(s): PROBNP in the last 8760 hours. HbA1C: No results for input(s): HGBA1C in the last 72 hours. CBG: No results for input(s): GLUCAP in the last 168 hours. Lipid Profile: No  results for input(s): CHOL, HDL, LDLCALC, TRIG, CHOLHDL, LDLDIRECT in the last 72 hours. Thyroid Function Tests: No results for input(s): TSH, T4TOTAL, FREET4, T3FREE, THYROIDAB in the last 72 hours. Anemia Panel: No results for input(s): VITAMINB12, FOLATE, FERRITIN, TIBC, IRON, RETICCTPCT in the last 72 hours. Urine analysis:    Component Value Date/Time   COLORURINE YELLOW 02/22/2018 2000   APPEARANCEUR CLEAR 02/22/2018 2000   LABSPEC 1.013 02/22/2018 2000   PHURINE 6.0 02/22/2018 2000   GLUCOSEU 50 (A) 02/22/2018 2000   HGBUR NEGATIVE 02/22/2018 2000   BILIRUBINUR NEGATIVE 02/22/2018 2000   KETONESUR NEGATIVE 02/22/2018 2000   PROTEINUR NEGATIVE 02/22/2018 2000   NITRITE NEGATIVE 02/22/2018 2000   LEUKOCYTESUR MODERATE (A) 02/22/2018 2000    Radiological Exams on Admission: Dg Chest Port 1 View  Result Date: 08/01/2018 CLINICAL DATA:  Central chest pain and dyspnea EXAM: PORTABLE CHEST 1 VIEW COMPARISON:  05/11/2018 FINDINGS: Stable cardiomegaly with aortic atherosclerosis. Left-sided pacemaker apparatus with leads in the right atrium and right ventricle appear stable. Trace right effusion with mild interstitial edema similar to prior. No pneumothorax. Degenerative change noted of both shoulders and dorsal spine. IMPRESSION: Stable cardiomegaly with aortic atherosclerosis. Trace right effusion with mild interstitial edema. Findings are similar to that seen on prior exam. Electronically Signed   By: Tollie Eth M.D.   On: 08/01/2018 18:31    EKG: Independently reviewed. Frequent pvcs. No ischemic changes  Assessment/Plan Principal Problem:   Elevated troponin Active Problems:   CVA (cerebral vascular accident) (HCC)   Accelerated hypertension   Diabetes mellitus (HCC)   Sick sinus syndrome (HCC)   Stage 3 chronic kidney disease (HCC)   Oxygen dependent   # elevated troponin- multiple risk factors for acs. Concern for nstemi.  Symptoms suggestive, 2nd troponin elevated. S/p  aspirin. Chest pain free. Hemodynamically stable. Cards has seen, thinks chf/htn may contribute or even be causative. DNR. Hx dCHF, most recent tte 02/2018. - per cards continue heparin gtt, asa 81, plavix 75 - hx statin intolerance - continue home BB - bp control as below - trend troponins - step down for now - tele, AM EKG - f/u risk strati labs - [place] o2, keep above 90% - npo overnight - cardiology has initiated lasix diuresis, will thus defer dose adjustments to them  #  Hypertension - bp severely elevated. Home bp meds ordered in ED - CTM, may need titration - cont home amlodipine, furosemide, imdur, maxide, metop  # DM - on glimepiride at home, a1c 6.9 02/2018 - hold glimep, start ssi  DVT prophylaxis: heparin as above, scds Code Status: dnr, confirmed w/ pt  Family Communication: Son Mendel Ryder  Disposition Plan: tbd  Consults called: cardiology   Admission status: step-down    Silvano Bilis MD [facility] Pager [phone]  If 7PM-7AM, please contact night-coverage [email or web address] Password [facility]  08/02/2018, 12:26 AM

## 2018-08-02 NOTE — Progress Notes (Signed)
Subjective:  Was admitted last night with chest pain.  Troponins have been elevated.  Blood pressure remains elevated.  Currently not having chest pain or shortness of breath.  Objective:  Vital Signs in the last 24 hours: BP (!) 176/66   Pulse (!) 59   Temp 97.7 F (36.5 C) (Oral)   Resp (!) 22   Ht 5\' 3"  (1.6 m)   Wt 77.1 kg   SpO2 (!) 88%   BMI 30.11 kg/m   Physical Exam: Elderly female currently in no acute distress Lungs:  Clear Cardiac:  Regular rhythm, normal S1 and S2, no S3 Abdomen:  Soft, nontender, no masses Extremities:  No edema present  Intake/Output from previous day: No intake/output data recorded.  Weight Filed Weights   08/01/18 1805  Weight: 77.1 kg    Lab Results: Basic Metabolic Panel: Recent Labs    08/01/18 1817 08/02/18 0315  NA 138 139  K 4.1 3.7  CL 98 98  CO2 28 28  GLUCOSE 284* 194*  BUN 16 13  CREATININE 0.82 0.75   CBC: Recent Labs    08/01/18 1817 08/02/18 0315  WBC 9.1 10.3  NEUTROABS 7.6  --   HGB 13.7 13.3  HCT 42.9 41.4  MCV 91.5 90.6  PLT 198 195   Cardiac Enzymes: Troponin (Point of Care Test) Recent Labs    08/01/18 2108  TROPIPOC 0.20*   Cardiac Panel (last 3 results) Recent Labs    08/02/18 0315  TROPONINI 0.23*    Telemetry: Sinus rhythm  Assessment/Plan:  1.  Non-STEMI with chest and arm discomfort 2.  CAD 3.  Uncontrolled hypertension 4.  History of chronic diastolic heart failure 5.  Functioning pacemaker  Recommendations:  Await serial troponins and continue IV heparin until then.  Blood pressure remains elevated and made additional blood pressure medicines as the blood pressure elevation may be contributing to her chest pain.     Darden PalmerW. Spencer Carmine Carrozza, Jr.  MD [facility] SpecialistsFACC Cardiology  08/02/2018, 8:48 AM

## 2018-08-02 NOTE — Progress Notes (Signed)
Pt arrived to 4E10 from ED via stretcher. Pt A&0x4. BP 183/94; MD notified. Pt with no complaints at this time. Tele applied;CCMD notified. Pt reported fall 2 weeks ago; bed alarm on.  Will continue to monitor.  Roselie AwkwardMegan Lorenda Grecco, RN, BSN

## 2018-08-02 NOTE — Progress Notes (Signed)
[facility] PROGRESS NOTE  Charlene Greene ZOX:[id number] DOB: 12-23-1921 DOA: 08/01/2018  PCP: [facility]  Brief History/Interval Summary: 84 y.o. female with medical history significant for CVA (including recent), sick sinus s/p pacemaker placement, RAS s/p stenting, HTN, DM, CKD, home O2 (2L), who presented with complaints of chest pain.  Patient was transported to the hospital by EMS.  There was some concern for V. tach and the patient was given amiodarone.  She was given nitroglycerin and aspirin.  On arrival she was chest pain-free.  She was found to have minimal elevation in troponin.  She was hospitalized for further management.  No history of diagnosed cardiac disease.  Did have a nuclear stress in 2015 that showed mild ischemia. TTE 02/2018 showed normal ef w/ grade 1 diastolic dysfunction.  Reason for Visit: Chest pain  Consultants: Cardiology  Procedures: None yet  Antibiotics: None  Subjective/Interval History: Patient denies any chest pain this morning.  She states that she is feeling better.  Denies any shortness of breath.  No nausea or vomiting.  States that usually her blood pressure is well controlled.  ROS: Denies any headaches  Objective:  Vital Signs  Vitals:   08/02/18 0645 08/02/18 0700 08/02/18 0715 08/02/18 0903  BP: (!) 167/81 (!) 164/82 (!) 176/66 (!) 183/94  Pulse: 65 (!) 34 (!) 59 70  Resp: 18 17 (!) 22 20  Temp:    98.4 F (36.9 C)  TempSrc:    Oral  SpO2: (!) 89% 92% (!) 88% 94%  Weight:      Height:    5\' 5"  (1.651 m)   No intake or output data in the 24 hours ending 08/02/18 8119 Filed Weights   08/01/18 1805  Weight: 77.1 kg    General appearance: alert, cooperative, appears stated age and no distress Head: Normocephalic, without obvious abnormality, atraumatic Resp: Normal effort at rest.  No wheezing or rhonchi.  Somewhat diminished air entry at the bases.  No definite crackles. Cardio: regular rate and rhythm,  S1, S2 normal, no murmur, click, rub or gallop GI: soft, non-tender; bowel sounds normal; no masses,  no organomegaly Extremities: Minimal edema bilateral lower extremities Pulses: 2+ and symmetric Neurologic: Alert and oriented x3.  No obvious focal neurological deficits.  Lab Results:  Data Reviewed: I have personally reviewed following labs and imaging studies  CBC: Recent Labs  Lab 08/01/18 1817 08/02/18 0315  WBC 9.1 10.3  NEUTROABS 7.6  --   HGB 13.7 13.3  HCT 42.9 41.4  MCV 91.5 90.6  PLT 198 195    Basic Metabolic Panel: Recent Labs  Lab 08/01/18 1817 08/02/18 0315  NA 138 139  K 4.1 3.7  CL 98 98  CO2 28 28  GLUCOSE 284* 194*  BUN 16 13  CREATININE 0.82 0.75  CALCIUM 8.7* 8.6*    GFR: Estimated Creatinine Clearance: 45.1 mL/min (by C-G formula based on SCr of 0.75 mg/dL).  Liver Function Tests: Recent Labs  Lab 08/01/18 1817  AST 25  ALT 27  ALKPHOS 88  BILITOT 0.8  PROT 6.0*  ALBUMIN 3.3*    Cardiac Enzymes: Recent Labs  Lab 08/02/18 0315  TROPONINI 0.23*    HbA1C: Recent Labs    08/02/18 0000  HGBA1C 6.9*    CBG: Recent Labs  Lab 08/02/18 0916  GLUCAP 175*    Lipid Profile: Recent Labs    08/02/18 0315  CHOL 268*  HDL 48  LDLCALC 190*  TRIG 151*  CHOLHDL 5.6  Radiology Studies: Dg Chest Port 1 View  Result Date: 08/01/2018 CLINICAL DATA:  Central chest pain and dyspnea EXAM: PORTABLE CHEST 1 VIEW COMPARISON:  05/11/2018 FINDINGS: Stable cardiomegaly with aortic atherosclerosis. Left-sided pacemaker apparatus with leads in the right atrium and right ventricle appear stable. Trace right effusion with mild interstitial edema similar to prior. No pneumothorax. Degenerative change noted of both shoulders and dorsal spine. IMPRESSION: Stable cardiomegaly with aortic atherosclerosis. Trace right effusion with mild interstitial edema. Findings are similar to that seen on prior exam. Electronically Signed   By: Tollie Ethavid  Kwon  M.D.   On: 08/01/2018 18:31     Medications:  Scheduled: . amLODipine  5 mg Oral BID  . aspirin  81 mg Oral Daily  . clopidogrel  75 mg Oral Daily  . furosemide  20 mg Oral Daily  . insulin aspart  0-15 Units Subcutaneous TID WC  . insulin aspart  0-5 Units Subcutaneous QHS  . lisinopril  20 mg Oral BID  . metoprolol succinate  100 mg Oral Daily  . ranolazine  500 mg Oral BID   Continuous: . heparin 800 Units/hr (08/02/18 0006)   ZOX:[id number]:hydrALAZINE, senna  Assessment/Plan:    Possible NSTEMI/history of CAD Cardiology has been consulted.  Continue to trend troponin levels.  Continue heparin.  Also noted to be on aspirin.  She is on Plavix as well.  History of statin intolerance.  LDL noted to be 190.  HbA1c 6.9.  Echocardiogram from April 2019 showed normal systolic function with grade 1 diastolic dysfunction.  Patient is on beta-blocker.  Patient is on Ranexa which is being continued as well.  Needs better control of her blood pressure.  Acute diastolic CHF Patient noted to be volume overloaded.  She was given 1 dose of IV furosemide.  Seems to be stable currently.  Continue to watch.  Echocardiogram last done in April 2019.  Will defer to cardiology is to be repeated.  Accelerated hypertension Blood pressure noted to be poorly controlled.  Patient seems surprised by this.  She tells me that usually her blood pressure reasonably well controlled.  Recent hospitalization notes reviewed and blood pressure noted to be 150s over 80s.  Her home medication regimen has been resumed.  Hydralazine intravenously as needed.  Continue to monitor closely.  History of PVCs Nonissue per cardiology notes.  Continue beta-blocker.  Diabetes mellitus type 2 Monitor CBGs.  Sliding scale coverage.  Holding her oral agents.  Previous history of stroke Stable.  Continue with antiplatelet agents.  History of sick sinus syndrome status post pacemaker Stable.  Monitor on telemetry.  DVT Prophylaxis:  On IV heparin    Code Status: DNR Family Communication: Discussed with the patient.  No other family at bedside. Disposition Plan: Management as outlined above.  Await further cardiology input.    LOS: 0 days   Osvaldo ShipperGokul Halston Fairclough  [facility] Pager [phone] 08/02/2018, 9:38 AM  If 7PM-7AM, please contact night-coverage at [email or web address], password [email or web address]

## 2018-08-02 NOTE — Progress Notes (Signed)
ANTICOAGULATION CONSULT NOTE - Follow Up Consult  Pharmacy Consult for heparin Indication: chest pain/ACS   Labs: Recent Labs    08/01/18 1817 08/02/18 0315  HGB 13.7 13.3  HCT 42.9 41.4  PLT 198 195  HEPARINUNFRC  --  0.45  CREATININE 0.82  --     Assessment/Plan:  84yo female therapeutic on heparin with initial dosing for CP. Will continue gtt at current rate and confirm stable with additional level.   Vernard GamblesVeronda Duaine Radin, PharmD, BCPS  08/02/2018,4:06 AM

## 2018-08-03 LAB — HEPARIN LEVEL (UNFRACTIONATED): HEPARIN UNFRACTIONATED: 0.47 [IU]/mL (ref 0.30–0.70)

## 2018-08-03 LAB — CBC
HEMATOCRIT: 37.3 % (ref 36.0–46.0)
Hemoglobin: 12 g/dL (ref 12.0–15.0)
MCH: 28.8 pg (ref 26.0–34.0)
MCHC: 32.2 g/dL (ref 30.0–36.0)
MCV: 89.7 fL (ref 78.0–100.0)
Platelets: 167 10*3/uL (ref 150–400)
RBC: 4.16 MIL/uL (ref 3.87–5.11)
RDW: 14.6 % (ref 11.5–15.5)
WBC: 8.6 10*3/uL (ref 4.0–10.5)

## 2018-08-03 LAB — GLUCOSE, CAPILLARY
GLUCOSE-CAPILLARY: 214 mg/dL — AB (ref 70–99)
GLUCOSE-CAPILLARY: 99 mg/dL (ref 70–99)
Glucose-Capillary: 150 mg/dL — ABNORMAL HIGH (ref 70–99)
Glucose-Capillary: 178 mg/dL — ABNORMAL HIGH (ref 70–99)

## 2018-08-03 MED ORDER — IPRATROPIUM-ALBUTEROL 0.5-2.5 (3) MG/3ML IN SOLN
3.0000 mL | RESPIRATORY_TRACT | Status: DC | PRN
  Administered 2018-08-03 – 2018-08-04 (×2): 3 mL via RESPIRATORY_TRACT
  Filled 2018-08-03 (×2): qty 3

## 2018-08-03 NOTE — Evaluation (Signed)
Physical Therapy Evaluation Patient Details Name: Charlene Greene MRN: [id number] DOB: [birth year] Today's Date: 08/03/2018   History of Present Illness  Pt adm with chest pain. Likely demand ischemia likely due to elevated blood pressure and chronic diastolic heart failure. PMH - pacer, CVA's, HTN, dm, chf  Clinical Impression  Pt admitted with above diagnosis and presents to PT with functional limitations due to deficits listed below (See PT problem list). Pt needs skilled PT to maximize independence and safety to allow discharge to back to ALF with HHPT. Expect pt is not quite at her baseline with mobility. Reports she has assistance with all mobility at ALF. Fatigued quickly today requiring increased assistance as activity progressed.     Follow Up Recommendations Home health PT(at ALF)    Equipment Recommendations  None recommended by PT    Recommendations for Other Services       Precautions / Restrictions Precautions Precautions: Fall      Mobility  Bed Mobility Overal bed mobility: Needs Assistance Bed Mobility: Sit to Supine       Sit to supine: Min assist   General bed mobility comments: assist to bring feet up into bed  Transfers Overall transfer level: Needs assistance Equipment used: 4-wheeled walker Transfers: Sit to/from [employer] Sit to Stand: Mod assist;Max assist Stand pivot transfers: Min assist;Max assist       General transfer comment: Standing from recliner pt mod assist to bring hips up and for posterior lean initially. From low commoded max assist. Pt mod assist to stand pivot from commode to sit on rollator and then unable to perform pivotal steps from rollator seat over to bed. Used stedy to go from rollator seat to bed.   Ambulation/Gait Ambulation/Gait assistance: Min assist;Mod assist Gait Distance (Feet): 15 Feet Assistive device: 4-wheeled walker Gait Pattern/deviations: Step-through pattern;Decreased step length -  right;Decreased step length - left;Trunk flexed Gait velocity: decr Gait velocity interpretation: <1.8 ft/sec, indicate of risk for recurrent falls General Gait Details: Assist for balance and support. Verbal cues to stand more erect and to stay closer to the walker. Trying to amb out of the bathroom pt had difficulty initiating steps and unable to amb back out of bathroom.   Stairs            Wheelchair Mobility    Modified Rankin (Stroke Patients Only)       Balance Overall balance assessment: Needs assistance Sitting-balance support: Bilateral upper extremity supported;Feet supported Sitting balance-Leahy Scale: Poor Sitting balance - Comments: UE support   Standing balance support: Bilateral upper extremity supported;During functional activity Standing balance-Leahy Scale: Poor Standing balance comment: walker and min assist for static standing                             Pertinent Vitals/Pain Pain Assessment: No/denies pain    Home Living Family/patient expects to be discharged to:: Assisted living               Home Equipment: Walker - 4 wheels;Wheelchair - manual Additional Comments: [employer]    Prior Function Level of Independence: Needs assistance   Gait / Transfers Assistance Needed: amb short distances with staff assistance and rollator. Transported with transport chair for longer distances  ADL's / Homemaking Assistance Needed: assistance for bathing, dressing and toileting        Hand Dominance   Dominant Hand: Right    Extremity/Trunk Assessment   Upper Extremity Assessment Upper  Extremity Assessment: Generalized weakness    Lower Extremity Assessment Lower Extremity Assessment: Generalized weakness       Communication   Communication: No difficulties  Cognition Arousal/Alertness: Awake/alert Behavior During Therapy: WFL for tasks assessed/performed Overall Cognitive Status: Within Functional Limits for tasks  assessed                                        General Comments      Exercises     Assessment/Plan    PT Assessment Patient needs continued PT services  PT Problem List Decreased strength;Decreased activity tolerance;Decreased balance;Decreased mobility;Decreased knowledge of use of DME       PT Treatment Interventions DME instruction;Gait training;Functional mobility training;Therapeutic activities;Therapeutic exercise;Balance training;Patient/family education    PT Goals (Current goals can be found in the Care Plan section)  Acute Rehab PT Goals Patient Stated Goal: return to ALF PT Goal Formulation: With patient Time For Goal Achievement: 08/17/18 Potential to Achieve Goals: Good    Frequency Min 3X/week   Barriers to discharge        Co-evaluation               AM-PAC PT "6 Clicks" Daily Activity  Outcome Measure Difficulty turning over in bed (including adjusting bedclothes, sheets and blankets)?: A Little Difficulty moving from lying on back to sitting on the side of the bed? : Unable Difficulty sitting down on and standing up from a chair with arms (e.g., wheelchair, bedside commode, etc,.)?: Unable Help needed moving to and from a bed to chair (including a wheelchair)?: A Lot Help needed walking in hospital room?: A Lot Help needed climbing 3-5 steps with a railing? : Total 6 Click Score: 10    End of Session Equipment Utilized During Treatment: Gait belt;Oxygen Activity Tolerance: Patient limited by fatigue Patient left: in bed;with call bell/phone within reach;with bed alarm set Nurse Communication: Mobility status(nurse tech) PT Visit Diagnosis: Unsteadiness on feet (R26.81);Other abnormalities of gait and mobility (R26.89);History of falling (Z91.81)    Time: [id number] PT Time Calculation (min) (ACUTE ONLY): 36 min   Charges:   PT Evaluation $PT Eval Moderate Complexity: 1 Mod PT Treatments $Gait Training: 8-22 mins         [facility] IncCary Akiba Melfi PT Acute Rehabilitation Services Pager [phone] Office [phone]   Angelina OkCary W [facility] 08/03/2018, 4:38 PM

## 2018-08-03 NOTE — Progress Notes (Signed)
[facility] PROGRESS NOTE  Charlene Greene [id number] DOB: December 10, 1921 DOA: 08/01/2018  PCP: [facility]  Brief History/Interval Summary: 84 y.o. female with medical history significant for CVA (including recent), sick sinus s/p pacemaker placement, RAS s/p stenting, HTN, DM, CKD, home O2 (2L), who presented with complaints of chest pain.  Patient was transported to the hospital by EMS.  There was some concern for V. tach and the patient was given amiodarone.  She was given nitroglycerin and aspirin.  On arrival she was chest pain-free.  She was found to have minimal elevation in troponin.  She was hospitalized for further management.  No history of diagnosed cardiac disease.  Did have a nuclear stress in 2015 that showed mild ischemia. TTE 02/2018 showed normal ef w/ grade 1 diastolic dysfunction.  Reason for Visit: Chest pain  Consultants: Cardiology  Procedures: None yet  Antibiotics: None  Subjective/Interval History: Patient denies any further chest pains.  No shortness of breath.  Denies any nausea or vomiting.    ROS: Denies any headaches  Objective:  Vital Signs  Vitals:   08/03/18 0332 08/03/18 0423 08/03/18 0720 08/03/18 0800  BP: (!) 164/70 140/64 (!) 161/73 (!) 148/73  Pulse: 66  61 (!) 58  Resp: 18  15 18   Temp: 98.2 F (36.8 C)  (!) 97.5 F (36.4 C) 97.7 F (36.5 C)  TempSrc: Oral  Oral Oral  SpO2: 95%  95% 97%  Weight:      Height:        Intake/Output Summary (Last 24 hours) at 08/03/2018 0914 Last data filed at 08/03/2018 0326 Gross per 24 hour  Intake 725.47 ml  Output 1200 ml  Net -474.53 ml   Filed Weights   08/01/18 1805  Weight: 77.1 kg    General appearance: Awake alert.  In no distress Resp: Normal effort at rest.  Clear to auscultation bilaterally.  Somewhat diminished air entry at the bases.   Cardio: S1-S2 is normal regular.  No S3-S4.  No rubs murmurs or bruit GI: Abdomen is soft.  Nontender nondistended.   Bowel sounds present.  No masses organomegaly Extremities: Minimal edema in lower extremities Neurologic: Alert and oriented x3.  No obvious focal neurological deficits  Lab Results:  Data Reviewed: I have personally reviewed following labs and imaging studies  CBC: Recent Labs  Lab 08/01/18 1817 08/02/18 0315 08/03/18 0445  WBC 9.1 10.3 8.6  NEUTROABS 7.6  --   --   HGB 13.7 13.3 12.0  HCT 42.9 41.4 37.3  MCV 91.5 90.6 89.7  PLT 198 195 167    Basic Metabolic Panel: Recent Labs  Lab 08/01/18 1817 08/02/18 0315  NA 138 139  K 4.1 3.7  CL 98 98  CO2 28 28  GLUCOSE 284* 194*  BUN 16 13  CREATININE 0.82 0.75  CALCIUM 8.7* 8.6*    GFR: Estimated Creatinine Clearance: 45.1 mL/min (by C-G formula based on SCr of 0.75 mg/dL).  Liver Function Tests: Recent Labs  Lab 08/01/18 1817  AST 25  ALT 27  ALKPHOS 88  BILITOT 0.8  PROT 6.0*  ALBUMIN 3.3*    Cardiac Enzymes: Recent Labs  Lab 08/02/18 0315 08/02/18 0848 08/02/18 1351  TROPONINI 0.23* 0.19* 0.18*    HbA1C: Recent Labs    08/02/18 0000  HGBA1C 6.9*    CBG: Recent Labs  Lab 08/02/18 1234 08/02/18 1611 08/02/18 1655 08/02/18 2135 08/03/18 0608  GLUCAP 181* 138* 109* 174* 150*    Lipid Profile: Recent  Labs    08/02/18 0315  CHOL 268*  HDL 48  LDLCALC 190*  TRIG 151*  CHOLHDL 5.6     Radiology Studies: Dg Chest Port 1 View  Result Date: 08/01/2018 CLINICAL DATA:  Central chest pain and dyspnea EXAM: PORTABLE CHEST 1 VIEW COMPARISON:  05/11/2018 FINDINGS: Stable cardiomegaly with aortic atherosclerosis. Left-sided pacemaker apparatus with leads in the right atrium and right ventricle appear stable. Trace right effusion with mild interstitial edema similar to prior. No pneumothorax. Degenerative change noted of both shoulders and dorsal spine. IMPRESSION: Stable cardiomegaly with aortic atherosclerosis. Trace right effusion with mild interstitial edema. Findings are similar to that  seen on prior exam. Electronically Signed   By: Tollie Eth M.D.   On: 08/01/2018 18:31     Medications:  Scheduled: . amLODipine  5 mg Oral BID  . aspirin  81 mg Oral Daily  . clopidogrel  75 mg Oral Daily  . furosemide  20 mg Oral Daily  . insulin aspart  0-15 Units Subcutaneous TID WC  . insulin aspart  0-5 Units Subcutaneous QHS  . lisinopril  20 mg Oral BID  . metoprolol succinate  100 mg Oral Daily  . ranolazine  500 mg Oral BID   Continuous: . heparin 1,200 Units/hr (08/02/18 2025)   ZOX:[id number], senna  Assessment/Plan:    Possible NSTEMI/history of CAD Cardiology is following.  Flat trend in troponin levels noted.  Patient is on heparin and aspirin Plavix.  History of statin intolerance.  LDL noted to be 190.  HbA1c 6.9.  Echocardiogram from April 2019 showed normal systolic function with grade 1 diastolic dysfunction.  Patient is on beta-blocker.  Patient is on Ranexa which is being continued as well.  Blood pressure appears to be slightly better controlled compared to before.  Continue to watch.  Further management per cardiology.  Acute diastolic CHF Patient was noted to have volume overload.  She was given 1 dose of IV Lasix at the time of admission and then continued orally.  Volume status appears to be stable currently.  Systolic function was noted to be normal on echocardiogram done in April 2019.    Accelerated hypertension Blood pressure was poorly controlled at the time of admission.  Patient mentioned that usually her blood pressure is reasonably well controlled.  Blood pressures over the last 24 hours have been somewhat better.  Continue with the amlodipine lisinopril and metoprolol.  Hydralazine intravenously as needed.  Continue to monitor closely.  History of PVCs Stable.  Continue beta-blocker.  Diabetes mellitus type 2 CBGs are reasonably well controlled.  Continue sliding scale insulin.  Holding her oral agents.    Previous history of  stroke Stable.  Continue with antiplatelet agents.  History of sick sinus syndrome status post pacemaker Stable.  Monitor on telemetry.  DVT Prophylaxis: On IV heparin    Code Status: DNR Family Communication: Discussed with the patient.  No family at bedside. Disposition Plan: Management as outlined above.  Await further cardiology input.  Patient remains on IV heparin.  Out of bed to chair.  Start mobilizing as tolerated.    LOS: 1 day   Osvaldo Shipper  [facility] Pager [phone] 08/03/2018, 9:14 AM  If 7PM-7AM, please contact night-coverage at [email or web address], password [facility]

## 2018-08-03 NOTE — Progress Notes (Signed)
Subjective:  No chest discomfort overnight.  Breathing is better and oxygen saturations have remained okay.  Her troponins are currently low level and are flat.  Blood pressure is still elevated but is coming down some but may have contributed to issue.  Remains on oxygen.  Evidently at home her oxygen tanks have run out and she was hypoxic.  Objective:  Vital Signs in the last 24 hours: BP (!) 148/73 (BP Location: Right Arm)   Pulse (!) 58   Temp 97.7 F (36.5 C) (Oral)   Resp 18   Ht 5\' 5"  (1.651 m)   Wt 77.1 kg   SpO2 97%   BMI 28.29 kg/m   Physical Exam: Elderly female currently in no acute distress Lungs:  Clear Cardiac:  Regular rhythm, normal S1 and S2, no S3 Abdomen:  Soft, nontender, no masses Extremities:  No edema present  Intake/Output from previous day: 09/21 0701 - 09/22 0700 In: 733.5 [P.O.:570; I.V.:163.5] Out: 1200 [Urine:1200]  Weight Filed Weights   08/01/18 1805  Weight: 77.1 kg    Lab Results: Basic Metabolic Panel: Recent Labs    08/01/18 1817 08/02/18 0315  NA 138 139  K 4.1 3.7  CL 98 98  CO2 28 28  GLUCOSE 284* 194*  BUN 16 13  CREATININE 0.82 0.75   CBC: Recent Labs    08/01/18 1817 08/02/18 0315 08/03/18 0445  WBC 9.1 10.3 8.6  NEUTROABS 7.6  --   --   HGB 13.7 13.3 12.0  HCT 42.9 41.4 37.3  MCV 91.5 90.6 89.7  PLT 198 195 167   Cardiac Enzymes: Troponin (Point of Care Test) Recent Labs    08/01/18 2108  TROPIPOC 0.20*   Cardiac Panel (last 3 results) Recent Labs    08/02/18 0315 08/02/18 0848 08/02/18 1351  TROPONINI 0.23* 0.19* 0.18*    Telemetry: Sinus rhythm with PVCs and occasional atrial paced beats.  Assessment/Plan:  1.  Nonspecific flat elevation of troponin that could be due to demand ischemia.  I do not think this was an acute myocardial infarction 2.  CAD 3.  Uncontrolled hypertension 4.  History of chronic diastolic heart failure 5.  Functioning pacemaker  Recommendations:  Discussed with  husband as well as wife over the telephone.  She questioned if the heart rhythm based on x-rays that were taken with the cell phone of the telemetry in the room and I told her after reviewing the heart rhythm that I felt her rhythm was okay.  At this point I think this was demand ischemia likely due to elevated blood pressure and chronic diastolic heart failure.  May have been exacerbated by lack of oxygen.  I would stop the heparin and have her ambulate and see how she does.  May need to have blood pressure medicines adjusted further if it remains up.     Darden PalmerW. Spencer Tilley, Jr.  MD [facility]  08/03/2018, 10:14 AM

## 2018-08-03 NOTE — Progress Notes (Signed)
ANTICOAGULATION CONSULT NOTE - Follow Up Consult  Pharmacy Consult for heparin Indication: NSTEMI   Labs: Recent Labs    08/01/18 1817  08/02/18 0315 08/02/18 0848 08/02/18 1351 08/02/18 1930 08/03/18 0445  HGB 13.7  --  13.3  --   --   --  12.0  HCT 42.9  --  41.4  --   --   --  37.3  PLT 198  --  195  --   --   --  167  HEPARINUNFRC  --    < > 0.45 0.13*  --  <0.10* 0.47  CREATININE 0.82  --  0.75  --   --   --   --   TROPONINI  --   --  0.23* 0.19* 0.18*  --   --    < > = values in this interval not displayed.    Assessment/Plan:  84yo female therapeutic on heparin after rate changes. Will continue gtt at current rate and confirm stable with additional level.   Vernard GamblesVeronda Livie Vanderhoof, PharmD, BCPS  08/03/2018,6:45 AM

## 2018-08-03 NOTE — Progress Notes (Signed)
RT got call from Nurse for patient.  Patient wanted a breathing treatment.  Assessment did not reveal any wheezing at the time, RT explained that patient was not wheezing.  Patient still wanted breathing treatrment stating that she was having trouble breathing.

## 2018-08-04 ENCOUNTER — Inpatient Hospital Stay (HOSPITAL_COMMUNITY): Payer: Medicare Other

## 2018-08-04 DIAGNOSIS — N183 Chronic kidney disease, stage 3 (moderate): Secondary | ICD-10-CM

## 2018-08-04 DIAGNOSIS — R748 Abnormal levels of other serum enzymes: Secondary | ICD-10-CM

## 2018-08-04 DIAGNOSIS — I248 Other forms of acute ischemic heart disease: Secondary | ICD-10-CM

## 2018-08-04 LAB — GLUCOSE, CAPILLARY
GLUCOSE-CAPILLARY: 210 mg/dL — AB (ref 70–99)
GLUCOSE-CAPILLARY: 131 mg/dL — AB (ref 70–99)
GLUCOSE-CAPILLARY: 155 mg/dL — AB (ref 70–99)
Glucose-Capillary: 143 mg/dL — ABNORMAL HIGH (ref 70–99)

## 2018-08-04 LAB — BASIC METABOLIC PANEL
Anion gap: 11 (ref 5–15)
BUN: 20 mg/dL (ref 8–23)
CALCIUM: 8.4 mg/dL — AB (ref 8.9–10.3)
CHLORIDE: 95 mmol/L — AB (ref 98–111)
CO2: 31 mmol/L (ref 22–32)
Creatinine, Ser: 0.94 mg/dL (ref 0.44–1.00)
GFR calc non Af Amer: 51 mL/min — ABNORMAL LOW (ref >60)
GFR, EST AFRICAN AMERICAN: 59 mL/min — AB (ref >60)
Glucose, Bld: 182 mg/dL — ABNORMAL HIGH (ref 70–99)
Potassium: 3.6 mmol/L (ref 3.5–5.1)
SODIUM: 137 mmol/L (ref 135–145)

## 2018-08-04 MED ORDER — FUROSEMIDE 10 MG/ML IJ SOLN
40.0000 mg | Freq: Once | INTRAMUSCULAR | Status: AC
  Administered 2018-08-04: 40 mg via INTRAVENOUS
  Filled 2018-08-04: qty 4

## 2018-08-04 MED ORDER — FUROSEMIDE 10 MG/ML IJ SOLN: 40.0000 mg | Freq: Two times a day (BID) | INTRAMUSCULAR | Status: DC

## 2018-08-04 MED ORDER — POTASSIUM CHLORIDE CRYS ER 20 MEQ PO TBCR
20.0000 meq | EXTENDED_RELEASE_TABLET | Freq: Two times a day (BID) | ORAL | Status: AC
  Administered 2018-08-04 (×2): 20 meq via ORAL
  Filled 2018-08-04 (×2): qty 1

## 2018-08-04 MED ORDER — ORAL CARE MOUTH RINSE
15.0000 mL | Freq: Two times a day (BID) | OROMUCOSAL | Status: DC
  Administered 2018-08-04 – 2018-08-07 (×3): 15 mL via OROMUCOSAL

## 2018-08-04 MED ORDER — FUROSEMIDE 40 MG PO TABS: 40.0000 mg | ORAL_TABLET | Freq: Every day | ORAL | Status: DC

## 2018-08-04 MED ORDER — ENOXAPARIN SODIUM 40 MG/0.4ML ~~LOC~~ SOLN
40.0000 mg | SUBCUTANEOUS | Status: DC
  Administered 2018-08-04 – 2018-08-06 (×3): 40 mg via SUBCUTANEOUS
  Filled 2018-08-04 (×4): qty 0.4

## 2018-08-04 NOTE — Evaluation (Signed)
Clinical/Bedside Swallow Evaluation Patient Details  Name: Charlene Greene MRN: [id number] Date of Birth: 1922-05-01  Today's Date: 08/04/2018 Time: SLP Start Time (ACUTE ONLY): 1438 SLP Stop Time (ACUTE ONLY): 1456 SLP Time Calculation (min) (ACUTE ONLY): 18 min  Past Medical History:  Past Medical History:  Diagnosis Date  . CAD (coronary artery disease)   . Diabetes mellitus without complication (HCC)   . Hyperlipidemia   . Hypertension   . Pacemaker   . Renal artery stenosis (HCC)    s/p bilateral stents  . Sick sinus syndrome (HCC)   . Stroke [facility](HCC)    2 previous cva's   Past Surgical History:  Past Surgical History:  Procedure Laterality Date  . PACEMAKER PLACEMENT  05/15/2014   [facility]   HPI:  Charlene Greene is a 84 y.o. female with medical history significant for CVA (2017 and 2019), dysphagia, sick sinus s/p pacemaker placement, RAS s/p stenting, HTN, DM, CKD, home O2 (2L), who presents w/ above. Per notes pt was on a group outing from her ALF and acutely developed substernal chest pain/pressure. Found to have elevated troponin and MD diagnosed pt likely had demand ischemia in the setting of hypertension and diastolic heart failure with possible exacerbation by lack of oxygen (patient reported yesterday that her home oxygen had run out). CXR Persistent CHF with increased RIGHT pleural effusion and bibasilar atelectasis. MBS 05/08/18 with mild oropharyngeal dysphagia and suspect esophageal dysphagia; penetration to vocal cords suspected after the swallow. Barium tablet became lodged in vallecula and was cleared with follow-up puree bolus. Esophageal sweep revealed stasis of barium material to the mid-upper level of the esophagus   Assessment / Plan / Recommendation Clinical Impression  Pt was alert and pleasant throughout evaluation. She reported history of pneumonia, strokes, as well as dysphagia. In addition, pt reported coughing  1-2 times per meal and fatigue associated with PO intake. During 3 oz water test, pt was unable to finish without stopping due to decreased respiratory status and exhibted immediate coughing episode. Of note, pt also presented with slightly hoarse and occasionally wet vocal quality as well as additional coughs following further trials of thin. No overt s/s aspiration observed with puree or regular. SLP recommends continue regular diet with thin liquids with slow/small bites and sips, and defer treatment plan until instrumental MBS evaluation completed tomorrow.    SLP Visit Diagnosis: Dysphagia, pharyngeal phase (R13.13)    Aspiration Risk  Moderate aspiration risk    Diet Recommendation Regular;Thin liquid   Liquid Administration via: Cup;Straw Medication Administration: Whole meds with liquid Supervision: Patient able to self feed;Intermittent supervision to cue for compensatory strategies Compensations: Slow rate;Small sips/bites;Minimize environmental distractions Postural Changes: Seated upright at 90 degrees;Remain upright for at least 30 minutes after po intake    Other  Recommendations Recommended Consults: Consider esophageal assessment Oral Care Recommendations: Oral care BID   Follow up Recommendations Other (comment)(ALF)      Frequency and Duration            Prognosis        Swallow Study   General HPI: Charlene Greene is a 84 y.o. female with medical history significant for CVA (2017 and 2019), dysphagia, sick sinus s/p pacemaker placement, RAS s/p stenting, HTN, DM, CKD, home O2 (2L), who presents w/ above. Per notes pt was on a group outing from her ALF and acutely developed substernal chest pain/pressure. Found to have elevated troponin and MD diagnosed pt likely  had demand ischemia in the setting of hypertension and diastolic heart failure with possible exacerbation by lack of oxygen (patient reported yesterday that her home oxygen had run out). CXR Persistent CHF with  increased RIGHT pleural effusion and bibasilar atelectasis. MBS 05/08/18 with mild oropharyngeal dysphagia and suspect esophageal dysphagia; penetration to vocal cords suspected after the swallow. Barium tablet became lodged in vallecula and was cleared with follow-up puree bolus. Esophageal sweep revealed stasis of barium material to the mid-upper level of the esophagus Type of Study: Bedside Swallow Evaluation Previous Swallow Assessment: BSE, MBS (previous hospital stays)(see impression) Diet Prior to this Study: Regular;Thin liquids Temperature Spikes Noted: No Respiratory Status: Nasal cannula History of Recent Intubation: No Behavior/Cognition: Alert;Cooperative;Pleasant mood;Requires cueing Oral Cavity Assessment: Within Functional Limits Oral Care Completed by SLP: No Oral Cavity - Dentition: Adequate natural dentition Vision: Functional for self-feeding Self-Feeding Abilities: Able to feed self Patient Positioning: Upright in bed Baseline Vocal Quality: Hoarse Volitional Cough: Strong Volitional Swallow: Able to elicit    Oral/Motor/Sensory Function Overall Oral Motor/Sensory Function: Within functional limits   Ice Chips Ice chips: Not tested   Thin Liquid Thin Liquid: Impaired Presentation: Cup;Straw Oral Phase Impairments: (none) Oral Phase Functional Implications: (none) Pharyngeal  Phase Impairments: Wet Vocal Quality;Cough - Immediate    Nectar Thick Nectar Thick Liquid: Not tested   Honey Thick Honey Thick Liquid: Not tested   Puree Puree: Within functional limits Presentation: Spoon;Self Fed   Solid     Solid: Within functional limits Presentation: Self Fed     Suzzette Righter, Student SLP  Suzzette Righter 08/04/2018,4:03 PM

## 2018-08-04 NOTE — Progress Notes (Addendum)
Progress Note  Patient Name: Charlene Greene Date of Encounter: 08/04/2018  Primary Cardiologist: Willeen Cass [facility])  Subjective   Patient reports difficulty breathing and catching her breath last night. She received 2 breathing treatments (Duoneb), which she states helped some. She is currently on 4L of supplemental O2 via nasal cannula. She states her breathing has improved this morning. She denies any chest pain, palpitations, lightheadedness, or dizziness. Patient has no questions or concerns at this time. She states she would like to go home.  Inpatient Medications    Scheduled Meds: . amLODipine  5 mg Oral BID  . aspirin  81 mg Oral Daily  . clopidogrel  75 mg Oral Daily  . furosemide  20 mg Oral Daily  . insulin aspart  0-15 Units Subcutaneous TID WC  . insulin aspart  0-5 Units Subcutaneous QHS  . lisinopril  20 mg Oral BID  . metoprolol succinate  100 mg Oral Daily  . ranolazine  500 mg Oral BID   Continuous Infusions:  PRN Meds: hydrALAZINE, ipratropium-albuterol, senna   Vital Signs    Vitals:   08/04/18 0700 08/04/18 0800 08/04/18 0832 08/04/18 0912  BP:  (!) 148/114 140/63   Pulse: 61 66 65   Resp: 19 (!) 21 18   Temp:   98.3 F (36.8 C)   TempSrc:   Oral   SpO2: 94% 95% 95% 97%  Weight:      Height:        Intake/Output Summary (Last 24 hours) at 08/04/2018 0936 Last data filed at 08/04/2018 1610 Gross per 24 hour  Intake 480 ml  Output 500 ml  Net -20 ml   Filed Weights   08/01/18 1805  Weight: 77.1 kg    Telemetry    Atrial pacing with multiple PVCs - Personally Reviewed  Physical Exam   GEN: Elderly female resting comfortably in hospital bed in no acute distress. Currently on 4L of supplemental O2 via nasal cannula.  Neck: Supple. Cardiac: RRR with an occasional premature beat. No murmurs, rubs, or gallops.  Respiratory: No increased work of breathing. Crackles noted in bilateral bases (right > left). No wheezes or rhonchi. GI: Abdomen  soft, nontender, non-distended. Bowel sounds present. MS: No lower extremity edema. Neuro:  No focal deficits. Psych: Normal affect.  Labs    Chemistry Recent Labs  Lab 08/01/18 1817 08/02/18 0315 08/04/18 0411  NA 138 139 137  K 4.1 3.7 3.6  CL 98 98 95*  CO2 28 28 31   GLUCOSE 284* 194* 182*  BUN 16 13 20   CREATININE 0.82 0.75 0.94  CALCIUM 8.7* 8.6* 8.4*  PROT 6.0*  --   --   ALBUMIN 3.3*  --   --   AST 25  --   --   ALT 27  --   --   ALKPHOS 88  --   --   BILITOT 0.8  --   --   GFRNONAA 60* >60 51*  GFRAA >60 >60 59*  ANIONGAP 12 13 11      Hematology Recent Labs  Lab 08/01/18 1817 08/02/18 0315 08/03/18 0445  WBC 9.1 10.3 8.6  RBC 4.69 4.57 4.16  HGB 13.7 13.3 12.0  HCT 42.9 41.4 37.3  MCV 91.5 90.6 89.7  MCH 29.2 29.1 28.8  MCHC 31.9 32.1 32.2  RDW 15.1 14.8 14.6  PLT 198 195 167    Cardiac Enzymes Recent Labs  Lab 08/02/18 0315 08/02/18 0848 08/02/18 1351  TROPONINI 0.23* 0.19* 0.18*  Recent Labs  Lab 08/01/18 1821 08/01/18 2108  TROPIPOC 0.04 0.20*     BNP Recent Labs  Lab 08/02/18 0000  BNP 900.5*     DDimer No results for input(s): DDIMER in the last 168 hours.   Radiology    Dg Chest Port 1 View  Result Date: 08/04/2018 CLINICAL DATA:  Shortness of breath today, chest tightness EXAM: PORTABLE CHEST 1 VIEW COMPARISON:  Portable exam 0745 hours compared to 08/01/2018 FINDINGS: LEFT subclavian transvenous pacemaker with leads projecting over RIGHT atrium and RIGHT ventricle. Enlargement of cardiac silhouette with vascular congestion. BILATERAL pulmonary infiltrates slightly greater on RIGHT, favor pulmonary edema. Increased RIGHT pleural effusion and bibasilar atelectasis. No pneumothorax. Bones demineralized with advanced BILATERAL glenohumeral degenerative changes with destruction of the LEFT humeral head noted. IMPRESSION: Persistent CHF with increased RIGHT pleural effusion and bibasilar atelectasis. Electronically Signed   By:  Ulyses SouthwardMark  Boles M.D.   On: 08/04/2018 08:13    Cardiac Studies   ECHO 02/2018: Severe asymmetric LVH.  Vigorous LV function with a EF of 65 to 70%.  GR 1 DD with high filling pressures.  Mild aortic calcification with no stenosis (sclerosis).  Calcified mitral annulus without stenosis.  ECHO 2017: Ejection Fraction = >55%.The left ventricular wall motion is normal.There is mild mitral regurgitation.  NUCLEAR STRESS TEST 2015: Reportedly with mild inferolateral ischemia  Patient Profile   Kattie Cliftonis a 84 y.o.femalewith a history ofCAD, chronic diastolic heart failure, HTN, HLD,sick sinus syndrome s/p PPM (MDT), prior CVAs, DM, who is being seen today for the evaluation of elevated troponin at the request of Dr. Silverio LayYao.  Assessment & Plan    1. Elevated Troponin with History of CAD (history of small non-STEMI but no record of cardiac catheterization) --likely demand ischemia with hypotension and hypoxia.  Most consistent with accelerated hypertension from dietary discretion. - Patient presented with nonspecific episode of chest pain that was relieved with NTG. Troponin elevated x3 on 08/02/2018 but downtrending (0.23 >> 0.19 >> 0.18). Most likely demand ischemia in the setting of hypertension and diastolic heart failure with possible exacerbation by lack of oxygen (patient reported yesterday that her home oxygen takes had run out). - Nuclear Stress Test [id number] showed mild inferolateral ischemia. - Echo as below. - Patient denies any angina. She did report some difficulty breathing last night. Respiratory Therapy assessed patient. Lungs were clear at the time but patient requested Duoneb. Patient states her breathing has improved this morning. Patient remains on 4L of supplemental O2 via nasal cannula. - Continue Aspirin 81mg  and Plavix 75mg  daily. - Continue Toprol XL 100mg  daily.  - Continue Ranexa 500mg  twice daily. - Patient reportedly intolerant to statins.  Defer to primary  cardiologist.  2. Hypertension  - BP improving. Most recent BP 145/66 this morning. - Continue Amlodipine 5mg  twice daily. - Continue Lisinopril 20mg  twice daily.  - Continue Toprol-XL as above.  Blood pressure remains borderline controlled, however would be reluctant to add new medicine.   3. Chronic Diastolic Heart Failure  - Echo 02/25/2018 showed severe asymmetric hypertrophy of LV with EF of 65-70% and grade 1 diastolic dysfucntion; no wall motion abnormalities noted. - Patient reported difficulty breathing last night and received 2 breathing treatment. Crackles in bilaterally bases on exam. No significant peripheral edema. - Chest x-ray today showed pulmonary vascular congestion with bilateral infiltrates (likely pulmonary edema).  Bibasilar atelectasis.  - Documented urine output of 1.7 L in the past 24 hours with a net of minus 246.5 mL  since admission. - Continue gentle diuresis with IV Lasix 40mg . Patient received PO Lasix at 9:56am; therefore, will hold morning dose of IV Lasix at this time. - Continue Toprol XL and Amlodipine as above.    4. Sick Sinus Syndrome s/p PPM - Stable. - Continue monitoring with telemetry.  5. PVCs - Telemetry reveals frequent PVCs. - Patient denies any palpitations.  - Continue Toprol-XL as above.  6. Diabetes Mellitus  - Hgb A1c 6.9 upon admission. - Per IM.   For questions or updates, please contact [facility] Please consult [email or web address] for contact info under        Signed, Corrin Parker, PA-C  08/04/2018, 9:36 AM is  I have seen, examined and evaluated the patient this AM along with Marjie Skiff, PA-C.  After reviewing all the available data and chart, we discussed the patients laboratory, study & physical findings as well as symptoms in detail. I agree with her findings, examination as well as impression recommendations as per our discussion.    Attending adjustments noted in italics.  Overall relatively stable.  No  further chest pain.  Still has some evidence of volume overload.  I agree with a dose of IV Lasix this afternoon.  I wrote for oral Lasix tomorrow, but can reassess clinically.  May simply need 40 twice daily p.o. Lasix prior to discharge.  Need to follow renal function closely.  Blood pressure still elevated, but am reluctant to add new medications.  Close to being ready for discharge.  Would like to see her respiratory stress improved with improved diuresis.    Bryan Lemma, M.D., M.S. Interventional Cardiologist   Pager # [phone] Phone # [phone] [address] [place], [place] [address]

## 2018-08-04 NOTE — NC FL2 (Addendum)
[place] MEDICAID FL2 LEVEL OF CARE SCREENING TOOL     IDENTIFICATION  Patient Name: Charlene Greene Birthdate: 07/29/1922 Sex: female Admission Date (Current Location): 08/01/2018  [facility] [place]   Facility and Address:  The . [facility], [address], [place], [place] [address]      Provider Number: [id number]  Attending Physician Name and Address:  Osvaldo ShipperKrishnan, Gokul, MD  Relative Name and Phone Number:  Mendel RyderRon Aldridge, [phone]    Current Level of Care: Hospital Recommended Level of Care: [facility] Prior Approval Number:    Date Approved/Denied:   PASRR Number:    Discharge Plan: Other (Comment)(back to facility)    Current Diagnoses: Patient Active Problem List   Diagnosis Date Noted  . Oxygen dependent 08/02/2018  . Elevated troponin 08/02/2018  . Pressure injury of skin 05/12/2018  . HCAP (healthcare-associated pneumonia) 05/08/2018  . Acute respiratory failure with hypoxia (HCC) 05/07/2018  . Sleep disturbance   . Elevated BUN   . Labile blood pressure   . Essential hypertension   . Dysphasia, post-stroke   . PAD (peripheral artery disease) (HCC)   . Hypoalbuminemia due to protein-calorie malnutrition (HCC)   . Diabetic peripheral neuropathy (HCC)   . Diabetes mellitus type 2 in nonobese (HCC)   . Chronic diastolic congestive heart failure (HCC)   . Left thalamic infarction (HCC) 02/26/2018  . Dysphagia, post-stroke   . Acute on chronic diastolic congestive heart failure (HCC)   . Type 2 diabetes mellitus with peripheral neuropathy (HCC)   . TIA (transient ischemic attack) 02/22/2018  . Labile blood glucose   . Hypotension due to drugs   . Hyponatremia   . Renovascular hypertension   . Hypertensive crisis   . Type 2 diabetes mellitus with diabetic autonomic neuropathy, without long-term current use of insulin (HCC)   . Stage 3 chronic kidney disease (HCC)   . Acute lower UTI    . Acute renal insufficiency 09/27/2016  . Urinary urgency 09/27/2016  . Basal ganglia infarction (HCC) 09/26/2016  . Neuropathic pain   . Benign essential HTN   . Slow transit constipation   . Elevated creatine kinase   . Accelerated hypertension 09/24/2016  . Diabetes mellitus (HCC) 09/24/2016  . Hyperlipidemia 09/24/2016  . Sick sinus syndrome (HCC) 09/24/2016  . CVA (cerebral vascular accident) (HCC) 09/23/2016  . Stroke (cerebrum) (HCC) 09/23/2016    Orientation RESPIRATION BLADDER Height & Weight     Self, Time, Situation, Place  O2(4L) External catheter, Continent Weight: 170 lb (77.1 kg) Height:  5\' 5"  (165.1 cm)  BEHAVIORAL SYMPTOMS/MOOD NEUROLOGICAL BOWEL NUTRITION STATUS      Continent    AMBULATORY STATUS COMMUNICATION OF NEEDS Skin   Limited Assist Verbally Normal                       Personal Care Assistance Level of Assistance  Bathing, Feeding, Dressing Bathing Assistance: Limited assistance Feeding assistance: Independent Dressing Assistance: Limited assistance     Functional Limitations Info  Sight, Hearing, Speech Sight Info: Adequate Hearing Info: Adequate Speech Info: Adequate    SPECIAL CARE FACTORS FREQUENCY  PT (By licensed PT), OT (By licensed OT)     PT Frequency: 3x wk OT Frequency: 3x wk            Contractures Contractures Info: Not present    Additional Factors Info  Code Status, Allergies Code Status Info: DNR Allergies Info: STATINS, OTHER, AMLODIPINE,  CELECOXIB, CODEINE, NAPROXEN, PIROXICAM           Current Medications (08/04/2018):   acetaminophen 500 MG tablet Commonly known as:  TYLENOL Take 500 mg by mouth See admin instructions. Take 500 mg by mouth two times a day and an additional 500 mg every 12 hours as needed for pain or fever- NOT TO EXCEED 2 "AS NEEDED" TABLETS IN 24 HOURS   amLODipine 5 MG tablet Commonly known as:  NORVASC Take 1 tablet (5 mg total) by mouth 2 (two) times daily.   aspirin 81  MG chewable tablet Chew 1 tablet (81 mg total) by mouth daily. With Food Start taking on:  08/08/2018   clopidogrel 75 MG tablet Commonly known as:  PLAVIX Take 1 tablet (75 mg total) by mouth daily.   dextromethorphan-guaiFENesin 30-600 MG 12hr tablet Commonly known as:  MUCINEX DM Take 1 tablet by mouth 2 (two) times daily.   feeding supplement (ENSURE ENLIVE) Liqd Take 237 mLs by mouth 2 (two) times daily between meals.   furosemide 80 MG tablet Commonly known as:  LASIX Take 1 tablet (80 mg total) by mouth daily. What changed:    medication strength  how much to take   glimepiride 1 MG tablet Commonly known as:  AMARYL Take 1 tablet (1 mg total) by mouth daily with breakfast.   Ipratropium-Albuterol 20-100 MCG/ACT Aers respimat Commonly known as:  COMBIVENT Inhale 2 puffs into the lungs 2 (two) times daily. For 2 weeks   isosorbide mononitrate 30 MG 24 hr tablet Commonly known as:  IMDUR Take 30 mg by mouth daily.   isosorbide mononitrate 60 MG 24 hr tablet Commonly known as:  IMDUR Take 1 tablet (60 mg total) by mouth daily.   lisinopril 20 MG tablet Commonly known as:  PRINIVIL,ZESTRIL Take 1 tablet (20 mg total) by mouth 2 (two) times daily.   Magnesium 250 MG Tabs Take 250 mg by mouth at bedtime.   metoprolol succinate 100 MG 24 hr tablet Commonly known as:  TOPROL-XL Take 1 tablet (100 mg total) by mouth daily.   multivitamin with minerals Tabs tablet Take 1 tablet by mouth daily.   nitroGLYCERIN 0.4 MG SL tablet Commonly known as:  NITROSTAT Place 0.4 mg under the tongue every 5 (five) minutes x 3 doses as needed for chest pain (and call 9-1-1 and MD, if no relief).   ranolazine 500 MG 12 hr tablet Commonly known as:  RANEXA Take 1 tablet (500 mg total) by mouth 2 (two) times daily.   senna 8.6 MG Tabs tablet Commonly known as:  SENOKOT Take 2 tablets (17.2 mg total) by mouth at bedtime. What changed:    how much to  take  when to take this  reasons to take this   Vitamin D-3 1000 units Caps Take 1,000 Units by mouth daily.       Discharge Medications: Please see discharge summary for a list of discharge medications.  Relevant Imaging Results:  Relevant Lab Results:   Additional Information SS#: [id number]  Althea Charon, LCSW

## 2018-08-04 NOTE — Clinical Social Work Note (Signed)
Clinical Social Work Assessment  Patient Details  Name: Charlene Greene MRN: [id number] Date of Birth: Jun 08, 1922  Date of referral:  08/04/18               Reason for consult:  Discharge Planning, Facility Placement                Permission sought to share information with:  Family Supports Permission granted to share information::  Yes, Verbal Permission Granted  Name::     Charlene Greene  Agency::  [facility]  Relationship::  son  Contact Information:  [phone]  Housing/Transportation Living arrangements for the past 2 months:  [place] of Information:  Patient Patient Interpreter Needed:  None Criminal Activity/Legal Involvement Pertinent to Current Situation/Hospitalization:  No - Comment as needed Significant Relationships:  Adult Children, Warehouse manager, Other Family Members Lives with:  Facility Resident Do you feel safe going back to the place where you live?  Yes Need for family participation in patient care:  Yes (Comment)  Care giving concerns:  No family at bedside. Patient stated she is from [facility] and has been there since last February. Patients stated she wants to go back to facility.   Social Worker assessment / plan:  CSW  Met patient at bedside to discuss discharge plan. Patient stated she is agreeable to go back to her ALF but stated she does not want home health following her. Patient stated she has had home health in the past and no longer wants it. Patient stated she has support from her adult children in the area. Patient stated that her ALF provides her food and assist her with medications. Patient stated that the facility is able to check in on her as much as possible depending on what her needs are. Patient gave CSW verbal permission to contact son to update him of discharge plan.  Employment status:  Retired Forensic scientist:  Medicare PT Recommendations:  Home with [place] / Referral to  community resources:  Other (Comment Required)(return to facility)  Patient/Family's Response to care:  Patient appreciates CSW role in care  Patient/Family's Understanding of and Emotional Response to Diagnosis, Current Treatment, and Prognosis:  Patient to return  back to [facility]   Emotional Assessment Appearance:  Appears stated age Attitude/Demeanor/Rapport:  Engaged Affect (typically observed):  Accepting Orientation:  Oriented to Self, Oriented to Place, Oriented to  Time, Oriented to Situation Alcohol / Substance use:  Not Applicable Psych involvement (Current and /or in the community):  No (Comment)  Discharge Needs  Concerns to be addressed:  Care Coordination Readmission within the last 30 days:  No Current discharge risk:  Dependent with Mobility Barriers to Discharge:  Continued Medical Work up   [employer], LCSW 08/04/2018, 2:13 PM

## 2018-08-04 NOTE — Progress Notes (Signed)
Physical Therapy Treatment Patient Details Name: Charlene Greene MRN: [id number] DOB: 1922-01-25 Today's Date: 08/04/2018    History of Present Illness Pt adm with chest pain. Likely demand ischemia likely due to elevated blood pressure and chronic diastolic heart failure. PMH - pacer, CVA's, HTN, dm, chf    PT Comments    Pt was seen for bed exercises as she would not agree to get OOB from fatigue.  Pt is able to follow instructions well and is understanding that her plan is to go to ALF with HHPT.  Pt is expected to continue to improve, and will encourage OOB on her next session.  Pt was seen later due to conflicts with earlier attempts to see her for PT.   Follow Up Recommendations  Home health PT     Equipment Recommendations  None recommended by PT    Recommendations for Other Services       Precautions / Restrictions Precautions Precautions: Fall Restrictions Weight Bearing Restrictions: No    Mobility  Bed Mobility Overal bed mobility: Needs Assistance Bed Mobility: (scooting up)       Sit to supine: Min assist      Transfers                 General transfer comment: declined this   Ambulation/Gait             General Gait Details: declined this   Social research officer, government Rank[employer] (Stroke Patients Only)       Balance                                            Cognition Arousal/Alertness: Awake/alert Behavior During Therapy: WFL for tasks assessed/performed Overall Cognitive Status: Within Functional Limits for tasks assessed                                 General Comments: Pt is speaking to PT about recent and past events appropriately      Exercises General Exercises - Lower Extremity Ankle Circles/Pumps: AROM;Both;5 reps Quad Sets: AROM;Both;5 reps Heel Slides: AROM;Both;20 reps Hip ABduction/ADduction: AROM;Both;20 reps Straight Leg Raises: AAROM;Both;10  reps    General Comments        Pertinent Vitals/Pain Pain Assessment: Faces Faces Pain Scale: Hurts a little bit Pain Intervention(s): Monitored during session;Repositioned    Home Living                      Prior Function            PT Goals (current goals can now be found in the care plan section) Acute Rehab PT Goals Patient Stated Goal: return to ALF Progress towards PT goals: Progressing toward goals    Frequency    Min 3X/week      PT Plan Current plan remains appropriate    Co-evaluation              AM-PAC PT "6 Clicks" Daily Activity  Outcome Measure  Difficulty turning over in bed (including adjusting bedclothes, sheets and blankets)?: A Little Difficulty moving from lying on back to sitting on the side of the bed? : Unable Difficulty sitting down on and standing up from a chair with  arms (e.g., wheelchair, bedside commode, etc,.)?: Unable Help needed moving to and from a bed to chair (including a wheelchair)?: A Lot Help needed walking in hospital room?: A Lot Help needed climbing 3-5 steps with a railing? : A Lot 6 Click Score: 11    End of Session Equipment Utilized During Treatment: Oxygen Activity Tolerance: Patient limited by fatigue Patient left: in bed;with call bell/phone within reach;with bed alarm set Nurse Communication: Mobility status PT Visit Diagnosis: Unsteadiness on feet (R26.81);Other abnormalities of gait and mobility (R26.89);History of falling (Z91.81)     Time: [id number] PT Time Calculation (min) (ACUTE ONLY): 20 min  Charges:  $Therapeutic Exercise: 8-22 mins                     Charlene DrapeRuth E Camaryn Greene 08/04/2018, 11:08 PM  Charlene Greene, PT MS Acute Rehab Dept. Number: [facility] [phone] and [facility] [phone]

## 2018-08-04 NOTE — Progress Notes (Signed)
[facility] PROGRESS NOTE  Charlene Greene WUJ:[id number] DOB: Apr 21, 1922 DOA: 08/01/2018  PCP: [facility]  Brief History/Interval Summary: 84 y.o. female with medical history significant for CVA (including recent), sick sinus s/p pacemaker placement, RAS s/p stenting, HTN, DM, CKD, home O2 (2L), who presented with complaints of chest pain.  Patient was transported to the hospital by EMS.  There was some concern for V. tach and the patient was given amiodarone.  She was given nitroglycerin and aspirin.  On arrival she was chest pain-free.  She was found to have minimal elevation in troponin.  She was hospitalized for further management.  No history of diagnosed cardiac disease.  Did have a nuclear stress in 2015 that showed mild ischemia. TTE 02/2018 showed normal ef w/ grade 1 diastolic dysfunction.  Reason for Visit: Chest pain  Consultants: Cardiology  Procedures: None yet  Antibiotics: None  Subjective/Interval History: Patient complains of shortness of breath this morning.  Kept her up all night.  She did get nebulizer treatments with some improvement.  Denies any chest pain.    ROS: Denies any headaches  Objective:  Vital Signs  Vitals:   08/04/18 0832 08/04/18 0900 08/04/18 0912 08/04/18 0954  BP: 140/63   (!) 145/64  Pulse: 65 69  63  Resp: 18 (!) 23  (!) 23  Temp: 98.3 F (36.8 C)     TempSrc: Oral     SpO2: 95% 96% 97% 94%  Weight:      Height:        Intake/Output Summary (Last 24 hours) at 08/04/2018 1016 Last data filed at 08/04/2018 0504 Gross per 24 hour  Intake 480 ml  Output 500 ml  Net -20 ml   Filed Weights   08/01/18 1805  Weight: 77.1 kg    General appearance: She is awake alert.  In no distress Resp: Noted to be tachypneic.  Crackles noted bilateral bases.  Occasional wheeze.  No rhonchi.  No use of accessory muscles.   Cardio: S1-S2 is normal regular. GI: Abdomen soft.  Nontender nondistended.  Bowel sounds are present.   No masses organomegaly Extremities: Minimal edema bilateral lower extremities noted Neurologic: Alert and oriented x3.  No focal neurological deficits.  Lab Results:  Data Reviewed: I have personally reviewed following labs and imaging studies  CBC: Recent Labs  Lab 08/01/18 1817 08/02/18 0315 08/03/18 0445  WBC 9.1 10.3 8.6  NEUTROABS 7.6  --   --   HGB 13.7 13.3 12.0  HCT 42.9 41.4 37.3  MCV 91.5 90.6 89.7  PLT 198 195 167    Basic Metabolic Panel: Recent Labs  Lab 08/01/18 1817 08/02/18 0315 08/04/18 0411  NA 138 139 137  K 4.1 3.7 3.6  CL 98 98 95*  CO2 28 28 31   GLUCOSE 284* 194* 182*  BUN 16 13 20   CREATININE 0.82 0.75 0.94  CALCIUM 8.7* 8.6* 8.4*    GFR: Estimated Creatinine Clearance: 38.4 mL/min (by C-G formula based on SCr of 0.94 mg/dL).  Liver Function Tests: Recent Labs  Lab 08/01/18 1817  AST 25  ALT 27  ALKPHOS 88  BILITOT 0.8  PROT 6.0*  ALBUMIN 3.3*    Cardiac Enzymes: Recent Labs  Lab 08/02/18 0315 08/02/18 0848 08/02/18 1351  TROPONINI 0.23* 0.19* 0.18*    HbA1C: Recent Labs    08/02/18 0000  HGBA1C 6.9*    CBG: Recent Labs  Lab 08/03/18 0608 08/03/18 1125 08/03/18 1652 08/03/18 2111 08/04/18 0608  GLUCAP 150*  214* 99 178* 210*    Lipid Profile: Recent Labs    08/02/18 0315  CHOL 268*  HDL 48  LDLCALC 190*  TRIG 151*  CHOLHDL 5.6     Radiology Studies: Dg Chest Port 1 View  Result Date: 08/04/2018 CLINICAL DATA:  Shortness of breath today, chest tightness EXAM: PORTABLE CHEST 1 VIEW COMPARISON:  Portable exam 0745 hours compared to 08/01/2018 FINDINGS: LEFT subclavian transvenous pacemaker with leads projecting over RIGHT atrium and RIGHT ventricle. Enlargement of cardiac silhouette with vascular congestion. BILATERAL pulmonary infiltrates slightly greater on RIGHT, favor pulmonary edema. Increased RIGHT pleural effusion and bibasilar atelectasis. No pneumothorax. Bones demineralized with advanced  BILATERAL glenohumeral degenerative changes with destruction of the LEFT humeral head noted. IMPRESSION: Persistent CHF with increased RIGHT pleural effusion and bibasilar atelectasis. Electronically Signed   By: Ulyses SouthwardMark  Boles M.D.   On: 08/04/2018 08:13     Medications:  Scheduled: . amLODipine  5 mg Oral BID  . aspirin  81 mg Oral Daily  . clopidogrel  75 mg Oral Daily  . furosemide  40 mg Intravenous Q12H  . insulin aspart  0-15 Units Subcutaneous TID WC  . insulin aspart  0-5 Units Subcutaneous QHS  . lisinopril  20 mg Oral BID  . metoprolol succinate  100 mg Oral Daily  . ranolazine  500 mg Oral BID   Continuous:  ZOX:[id number]:hydrALAZINE, ipratropium-albuterol, senna  Assessment/Plan:    Possible NSTEMI/history of CAD Cardiology is following.  Flat trend in troponin levels noted.  Patient was on aspirin and Plavix.  She was started on heparin.  History of statin intolerance.  LDL noted to be 190.  HbA1c 6.9.  Echocardiogram from April 2019 showed normal systolic function with grade 1 diastolic dysfunction.  Patient also noted to be on beta-blocker and Ranexa.  Symptoms thought to be secondary to blood pressure rather than a de novo cardiac event.  It does not appear that cardiology is planning any further testing.  Heparin has been discontinued.    Acute diastolic CHF Patient continues to be dyspneic this morning despite nebulizer treatments overnight.  Chest x-ray was done which does show pulmonary edema.  Patient also admits to occasional difficulty with swallowing food although denies any nausea vomiting.  She will be given intravenous diuretics.  Swallow evaluation will also be done.  Strict ins and outs and daily weights.  Systolic function was noted to be normal on echocardiogram done in April 2019.    Accelerated hypertension Blood pressure was poorly controlled at the time of admission.  Patient mentioned that usually her blood pressure is reasonably well controlled.  Blood pressure  has improved somewhat in the last 2 days.  Continue with amlodipine diuretics as discussed above.    History of PVCs Stable.  Continue beta-blocker.  Diabetes mellitus type 2 Continue to monitor CBGs.  Continue sliding scale insulin coverage.  Holding her oral agents.  HbA1c 6.9.     Previous history of stroke Stable.  Continue with antiplatelet agents.  History of sick sinus syndrome status post pacemaker Stable.  Monitor on telemetry.  DVT Prophylaxis: IV heparin was discontinued.  Initiate Lovenox. Code Status: DNR Family Communication: Discussed with the patient.  No family at bedside. Disposition Plan: Management as outlined above.  Noted to have pulmonary edema this morning.  She will be given IV diuretics.    LOS: 2 days   Osvaldo ShipperGokul Zoraya Fiorenza  [facility] Pager [phone] 08/04/2018, 10:16 AM  If 7PM-7AM, please contact night-coverage at [email or web address],  password [facility]

## 2018-08-04 NOTE — Progress Notes (Signed)
RT NOTES: Assessed patient on morning rounds. Patient said she had trouble breathing last night and received a breathing treatment and felt a little better. BBS clear to auscultation. Sats 97% on 4 lpm nasal cannula. No distress noted at this time. Will continue to monitor.

## 2018-08-04 NOTE — Progress Notes (Signed)
CSW following patient for support and discharge needs. [facility] to facility. CSW spoke with Olegario MessierKathy from facility and they stated they will be able to take patient back once medically cleared. CSW will continue to follow for support and discharge needs.   Marrianne MoodAshley Natisha Trzcinski, MSW,  [employer] [phone]

## 2018-08-05 ENCOUNTER — Other Ambulatory Visit: Payer: Self-pay

## 2018-08-05 ENCOUNTER — Inpatient Hospital Stay (HOSPITAL_COMMUNITY): Payer: Medicare Other

## 2018-08-05 DIAGNOSIS — I214 Non-ST elevation (NSTEMI) myocardial infarction: Secondary | ICD-10-CM | POA: Diagnosis present

## 2018-08-05 DIAGNOSIS — I5033 Acute on chronic diastolic (congestive) heart failure: Secondary | ICD-10-CM

## 2018-08-05 LAB — BASIC METABOLIC PANEL
Anion gap: 11 (ref 5–15)
BUN: 19 mg/dL (ref 8–23)
CHLORIDE: 96 mmol/L — AB (ref 98–111)
CO2: 30 mmol/L (ref 22–32)
CREATININE: 0.9 mg/dL (ref 0.44–1.00)
Calcium: 8.4 mg/dL — ABNORMAL LOW (ref 8.9–10.3)
GFR calc Af Amer: >60 mL/min (ref >60)
GFR calc non Af Amer: 53 mL/min — ABNORMAL LOW (ref >60)
Glucose, Bld: 170 mg/dL — ABNORMAL HIGH (ref 70–99)
Potassium: 4 mmol/L (ref 3.5–5.1)
Sodium: 137 mmol/L (ref 135–145)

## 2018-08-05 LAB — GLUCOSE, CAPILLARY
GLUCOSE-CAPILLARY: 153 mg/dL — AB (ref 70–99)
GLUCOSE-CAPILLARY: 142 mg/dL — AB (ref 70–99)
Glucose-Capillary: 153 mg/dL — ABNORMAL HIGH (ref 70–99)
Glucose-Capillary: 191 mg/dL — ABNORMAL HIGH (ref 70–99)
Glucose-Capillary: 167 mg/dL — ABNORMAL HIGH (ref 70–99)

## 2018-08-05 MED ORDER — FUROSEMIDE 10 MG/ML IJ SOLN: 40.0000 mg | Freq: Two times a day (BID) | INTRAMUSCULAR | Status: DC

## 2018-08-05 MED ORDER — FUROSEMIDE 10 MG/ML IJ SOLN
40.0000 mg | Freq: Once | INTRAMUSCULAR | Status: AC
  Administered 2018-08-05: 40 mg via INTRAVENOUS
  Filled 2018-08-05: qty 4

## 2018-08-05 MED ORDER — ISOSORBIDE MONONITRATE ER 30 MG PO TB24
30.0000 mg | ORAL_TABLET | Freq: Every day | ORAL | Status: DC
  Administered 2018-08-05 – 2018-08-07 (×3): 30 mg via ORAL
  Filled 2018-08-05 (×3): qty 1

## 2018-08-05 MED ORDER — FUROSEMIDE 10 MG/ML IJ SOLN
40.0000 mg | Freq: Two times a day (BID) | INTRAMUSCULAR | Status: DC
  Administered 2018-08-05 – 2018-08-07 (×4): 40 mg via INTRAVENOUS
  Filled 2018-08-05 (×4): qty 4

## 2018-08-05 NOTE — Progress Notes (Signed)
Modified Barium Swallow Progress Note  Patient Details  Name: Charlene Greene MRN: [id number] Date of Birth: 1922/06/12  Today's Date: 08/05/2018  Modified Barium Swallow completed.  Full report located under Chart Review in the Imaging Section.  Brief recommendations include the following:  Clinical Impression  Patient presents with what this SLP suspects is a primary esophageal dysphagia. Oropharyngeal phase largely normal with only intermittent flash penetration of thin liquids (normal/age appropriate). Patient with full airway protection. Esophageal sweep did note relatively significant statis, often to the level of the lower cervical/upper thoracic esophagus, placing patient at risk for post prandial aspiration. Discussed general reflux strategies with patient to mitigate risk. Will f/u briefly for reinforcement of strategies and education as needed.    Swallow Evaluation Recommendations       SLP Diet Recommendations: Regular solids;Thin liquid   Liquid Administration via: Cup;Straw   Medication Administration: Whole meds with liquid   Supervision: Patient able to self feed;Intermittent supervision to cue for compensatory strategies   Compensations: Slow rate;Small sips/bites;Follow solids with liquid   Postural Changes: Remain semi-upright after after feeds/meals (Comment);Seated upright at 90 degrees   Oral Care Recommendations: Oral care BID      Charlene Stalvey MA, CCC-SLP    Charlene Greene 08/05/2018,10:51 AM

## 2018-08-05 NOTE — Progress Notes (Signed)
[facility] PROGRESS NOTE  Charlene Greene WUJ:[id number] DOB: 1922/03/13 DOA: 08/01/2018  [facility]P: [facility]  Brief History/Interval Summary: 84 y.o. female with medical history significant for CVA (including recent), sick sinus s/p pacemaker placement, RAS s/p stenting, HTN, DM, CKD, home O2 (2L), who presented with complaints of chest pain.  Patient was transported to the hospital by EMS.  There was some concern for V. tach and the patient was given amiodarone.  She was given nitroglycerin and aspirin.  On arrival she was chest pain-free.  She was found to have minimal elevation in troponin.  She was hospitalized for further management.  No history of diagnosed cardiac disease.  Did have a nuclear stress in 2015 that showed mild ischemia. TTE 02/2018 showed normal ef w/ grade 1 diastolic dysfunction.  Reason for Visit: Chest pain  Consultants: Cardiology  Procedures: None yet  Antibiotics: None  Subjective/Interval History: Patient states that she feels about the same as yesterday.  Still short of breath.  Required oxygen mask overnight.  Denies any chest pain.    ROS: Denies any headaches  Objective:  Vital Signs  Vitals:   08/05/18 0400 08/05/18 0454 08/05/18 0500 08/05/18 0600  BP: (!) 166/65 (!) 169/73    Pulse: 67 67 69 64  Resp: 18 (!) 21 18 (!) 23  Temp:  98.3 F (36.8 C)    TempSrc:  Axillary    SpO2: 93% 93% 94% 98%  Weight:  75.8 kg    Height:        Intake/Output Summary (Last 24 hours) at 08/05/2018 0736 Last data filed at 08/05/2018 0517 Gross per 24 hour  Intake 120 ml  Output 1500 ml  Net -1380 ml   Filed Weights   08/01/18 1805 08/05/18 0454  Weight: 77.1 kg 75.8 kg    General appearance: Awake alert.  In no distress Resp: Noted to be tachypneic at rest.  No use of accessory muscles.  Continues to have crackles bilateral bases.  No wheezing heard today.  No rhonchi.    Cardio: S1-S2 is normal regular. GI: Abdomen soft.   Nontender nondistended.  Bowel sounds are present.  No masses organomegaly. Extremities: Edema noted bilateral lower extremity Neurologic: Alert and oriented x3.  No focal neurological deficits  Lab Results:  Data Reviewed: I have personally reviewed following labs and imaging studies  CBC: Recent Labs  Lab 08/01/18 1817 08/02/18 0315 08/03/18 0445  WBC 9.1 10.3 8.6  NEUTROABS 7.6  --   --   HGB 13.7 13.3 12.0  HCT 42.9 41.4 37.3  MCV 91.5 90.6 89.7  PLT 198 195 167    Basic Metabolic Panel: Recent Labs  Lab 08/01/18 1817 08/02/18 0315 08/04/18 0411 08/05/18 0416  NA 138 139 137 137  K 4.1 3.7 3.6 4.0  CL 98 98 95* 96*  CO2 28 28 31 30   GLUCOSE 284* 194* 182* 170*  BUN 16 13 20 19   CREATININE 0.82 0.75 0.94 0.90  CALCIUM 8.7* 8.6* 8.4* 8.4*    GFR: Estimated Creatinine Clearance: 39.8 mL/min (by C-G formula based on SCr of 0.9 mg/dL).  Liver Function Tests: Recent Labs  Lab 08/01/18 1817  AST 25  ALT 27  ALKPHOS 88  BILITOT 0.8  PROT 6.0*  ALBUMIN 3.3*    Cardiac Enzymes: Recent Labs  Lab 08/02/18 0315 08/02/18 0848 08/02/18 1351  TROPONINI 0.23* 0.19* 0.18*    CBG: Recent Labs  Lab 08/04/18 [id number] 08/04/18 1204 08/04/18 1642 08/04/18 2108 08/05/18 0557  GLUCAP 210* 143* 131* 155* 153*     Radiology Studies: Dg Chest Port 1 View  Result Date: 08/04/2018 CLINICAL DATA:  Shortness of breath today, chest tightness EXAM: PORTABLE CHEST 1 VIEW COMPARISON:  Portable exam 0745 hours compared to 08/01/2018 FINDINGS: LEFT subclavian transvenous pacemaker with leads projecting over RIGHT atrium and RIGHT ventricle. Enlargement of cardiac silhouette with vascular congestion. BILATERAL pulmonary infiltrates slightly greater on RIGHT, favor pulmonary edema. Increased RIGHT pleural effusion and bibasilar atelectasis. No pneumothorax. Bones demineralized with advanced BILATERAL glenohumeral degenerative changes with destruction of the LEFT humeral head  noted. IMPRESSION: Persistent CHF with increased RIGHT pleural effusion and bibasilar atelectasis. Electronically Signed   By: Ulyses Southward M.D.   On: 08/04/2018 08:13     Medications:  Scheduled: . amLODipine  5 mg Oral BID  . aspirin  81 mg Oral Daily  . clopidogrel  75 mg Oral Daily  . enoxaparin (LOVENOX) injection  40 mg Subcutaneous Q24H  . furosemide  40 mg Intravenous Once  . insulin aspart  0-15 Units Subcutaneous TID WC  . insulin aspart  0-5 Units Subcutaneous QHS  . lisinopril  20 mg Oral BID  . mouth rinse  15 mL Mouth Rinse BID  . metoprolol succinate  100 mg Oral Daily  . ranolazine  500 mg Oral BID   Continuous:  NWG:[id number], ipratropium-albuterol, senna  Assessment/Plan:    Possible NSTEMI/history of CAD Flat trend in troponin levels noted.  It is felt that her symptoms and mild elevation in troponin is still secondary to elevated blood pressure rather than a [place] cardiac event.  Cardiology was consulted.  Patient was started on heparin.  She remains on aspirin and Plavix.  She is on a beta-blocker as well as Ranexa.  Blood pressures have improved.  LDL noted to be 190.  Patient has history of statin intolerance.  HbA1c 6.9.  Echocardiogram from April 2019 showed normal systolic function with grade 1 diastolic dysfunction.  No further testing is being considered at this time.  Heparin was discontinued.     Acute diastolic CHF Patient noted to be dyspneic yesterday.  Chest x-ray showed pulmonary edema.  Patient was started on IV Lasix.  She has diuresed some.  She continues to have symptoms and continues to have crackles in both the lungs.  Discussed with cardiology.  She will be given an additional dose of IV Lasix this morning.  May need additional dose tonight.  Continue to do strict ins and outs and daily weights.  Bedside swallow evaluation was unremarkable.  It appears that a modified barium swallow is being considered. Systolic function was noted to be  normal on echocardiogram done in April 2019.    Accelerated hypertension Blood pressure was poorly controlled at the time of admission.  Patient mentioned that usually her blood pressure is reasonably well controlled.  She was continued on her antihypertensives.  She is also getting diuretics.  Blood pressure has stabilized over the last 2 days.  Occasional high readings are noted.  Patient is on amlodipine, metoprolol, lisinopril and furosemide.  Continue to monitor.  History of PVCs Stable.  Continue beta-blocker.  Diabetes mellitus type 2 CBGs are reasonably well controlled.  Continue to monitor.  HbA1c 6.9.  Continue sliding scale insulin coverage.     Previous history of stroke Stable.  Continue with antiplatelet agents.  History of sick sinus syndrome status post pacemaker Stable.  Monitor on telemetry.  DVT Prophylaxis: Lovenox Code Status: DNR Family Communication: Cussed  with the patient.  No family at bedside. Disposition Plan: Management as outlined above.  She continues to require intravenous diuretics.  Mobilize as tolerated.    LOS: 3 days   Osvaldo Shipper  [facility] Pager [phone] 08/05/2018, 7:36 AM  If 7PM-7AM, please contact night-coverage at [email or web address], password [facility]

## 2018-08-05 NOTE — Progress Notes (Addendum)
Progress Note  Patient Name: Kamarah Bilotta Date of Encounter: 08/05/2018  Primary Cardiologist: [facility])  Subjective    Per nursing note, patient's O2 sats dropped to 82% last night while the patient was on 4 L of supplemental O2 via nasal cannula. O2 was increased to 5 L on nasal cannula but O2 sats continued to be 82-88%. Patient was switched to Venturi Mask 35% and O2 sats stayed at 88-90%. Venturi Mask increased to 40% and sats were at 92%. Throughout all of this, patient sleeping and resting comfortably.  Patient denies any chest pain or shortness of breath. She denies any difficulty breathing last night when her O2 sats dropped. Patient is currently back on 5L via nasal cannula. She does not have any questions or concerns at this time.   Inpatient Medications    Scheduled Meds: . amLODipine  5 mg Oral BID  . aspirin  81 mg Oral Daily  . clopidogrel  75 mg Oral Daily  . enoxaparin (LOVENOX) injection  40 mg Subcutaneous Q24H  . furosemide  40 mg Intravenous BID  . insulin aspart  0-15 Units Subcutaneous TID WC  . insulin aspart  0-5 Units Subcutaneous QHS  . isosorbide mononitrate  30 mg Oral Daily  . lisinopril  20 mg Oral BID  . mouth rinse  15 mL Mouth Rinse BID  . metoprolol succinate  100 mg Oral Daily  . ranolazine  500 mg Oral BID   Continuous Infusions:  PRN Meds: hydrALAZINE, ipratropium-albuterol, senna   Vital Signs    Vitals:   08/05/18 0454 08/05/18 0500 08/05/18 0600 08/05/18 0826  BP: (!) 169/73   (!) 148/87  Pulse: 67 69 64 67  Resp: (!) 21 18 (!) 23 (!) 21  Temp: 98.3 F (36.8 C)   97.6 F (36.4 C)  TempSrc: Axillary   Oral  SpO2: 93% 94% 98% 99%  Weight: 75.8 kg     Height:        Intake/Output Summary (Last 24 hours) at 08/05/2018 1214 Last data filed at 08/05/2018 0900 Gross per 24 hour  Intake 240 ml  Output 1500 ml  Net -1260 ml   Filed Weights   08/01/18 1805 08/05/18 0454  Weight: 77.1 kg 75.8 kg    Telemetry    Atrial  pacing rhythm with multiple PVCs - Personally Reviewed  Physical Exam   GEN: Elderly overweight female resting comfortably in hospital bed in no acute distress. Currently on 5L of supplemental O2 via nasal cannula. Neck: Supple Cardiac: RRR. No murmurs, rubs, or gallops.  Respiratory: No increased work of breathing. Crackles noted in bilateral bases. No wheezes or rhonchi. GI: Abdomen soft, nontender, non-distended  MS: No lower extremity edema. Neuro:  No focal deficits. Psych: Normal affect.  Labs    Chemistry Recent Labs  Lab 08/01/18 1817 08/02/18 0315 08/04/18 0411 08/05/18 0416  NA 138 139 137 137  K 4.1 3.7 3.6 4.0  CL 98 98 95* 96*  CO2 28 28 31 30   GLUCOSE 284* 194* 182* 170*  BUN 16 13 20 19   CREATININE 0.82 0.75 0.94 0.90  CALCIUM 8.7* 8.6* 8.4* 8.4*  PROT 6.0*  --   --   --   ALBUMIN 3.3*  --   --   --   AST 25  --   --   --   ALT 27  --   --   --   ALKPHOS 88  --   --   --  BILITOT 0.8  --   --   --   GFRNONAA 60* >60 51* 53*  GFRAA >60 >60 59* >60  ANIONGAP 12 13 11 11      Hematology Recent Labs  Lab 08/01/18 1817 08/02/18 0315 08/03/18 0445  WBC 9.1 10.3 8.6  RBC 4.69 4.57 4.16  HGB 13.7 13.3 12.0  HCT 42.9 41.4 37.3  MCV 91.5 90.6 89.7  MCH 29.2 29.1 28.8  MCHC 31.9 32.1 32.2  RDW 15.1 14.8 14.6  PLT 198 195 167    Cardiac Enzymes Recent Labs  Lab 08/02/18 0315 08/02/18 0848 08/02/18 1351  TROPONINI 0.23* 0.19* 0.18*    Recent Labs  Lab 08/01/18 1821 08/01/18 2108  TROPIPOC 0.04 0.20*     BNP Recent Labs  Lab 08/02/18 0000  BNP 900.5*     DDimer No results for input(s): DDIMER in the last 168 hours.   Radiology    Dg Chest Port 1 View  Result Date: 08/04/2018 CLINICAL DATA:  Shortness of breath today, chest tightness EXAM: PORTABLE CHEST 1 VIEW COMPARISON:  Portable exam 0745 hours compared to 08/01/2018 FINDINGS: LEFT subclavian transvenous pacemaker with leads projecting over RIGHT atrium and RIGHT ventricle.  Enlargement of cardiac silhouette with vascular congestion. BILATERAL pulmonary infiltrates slightly greater on RIGHT, favor pulmonary edema. Increased RIGHT pleural effusion and bibasilar atelectasis. No pneumothorax. Bones demineralized with advanced BILATERAL glenohumeral degenerative changes with destruction of the LEFT humeral head noted. IMPRESSION: Persistent CHF with increased RIGHT pleural effusion and bibasilar atelectasis. Electronically Signed   By: Ulyses Southward M.D.   On: 08/04/2018 08:13   Dg Swallowing Func-speech Pathology  Result Date: 08/05/2018 Objective Swallowing Evaluation: Type of Study: MBS-Modified Barium Swallow Study  Patient Details Name: Kymber Kosar MRN: [id number] Date of Birth: 10-27-1922 Today's Date: 08/05/2018 Time: SLP Start Time (ACUTE ONLY): 1034 -SLP Stop Time (ACUTE ONLY): 1050 SLP Time Calculation (min) (ACUTE ONLY): 16 min Past Medical History: Past Medical History: Diagnosis Date . CAD (coronary artery disease)  . Diabetes mellitus without complication (HCC)  . Hyperlipidemia  . Hypertension  . Pacemaker  . Renal artery stenosis (HCC)   s/p bilateral stents . Sick sinus syndrome (HCC)  . Stroke [facility])   2 previous cva's Past Surgical History: Past Surgical History: Procedure Laterality Date . PACEMAKER PLACEMENT  05/15/2014  MDT Advisa MRI implanted at [facility] for sick sinus syndrome HPI: Anice Wilshire is a 84 y.o. female with medical history significant for CVA (2017 and 2019), dysphagia, sick sinus s/p pacemaker placement, RAS s/p stenting, HTN, DM, CKD, home O2 (2L), who presents w/ above. Per notes pt was on a group outing from her ALF and acutely developed substernal chest pain/pressure. Found to have elevated troponin and MD diagnosed pt likely had demand ischemia in the setting of hypertension and diastolic heart failure with possible exacerbation by lack of oxygen (patient reported yesterday that her home oxygen had run out). CXR Persistent CHF with increased RIGHT  pleural effusion and bibasilar atelectasis. MBS 05/08/18 with mild oropharyngeal dysphagia and suspect esophageal dysphagia; penetration to vocal cords suspected after the swallow. Barium tablet became lodged in vallecula and was cleared with follow-up puree bolus. Esophageal sweep revealed stasis of barium material to the mid-upper level of the esophagus  No data recorded Assessment / Plan / Recommendation CHL IP CLINICAL IMPRESSIONS 08/05/2018 Clinical Impression Patient presents with what this SLP suspects is a primary esophageal dysphagia. Oropharyngeal phase largely normal with only intermittent flash penetration of thin liquids (normal/age appropriate). Patient  with full airway protection. Esophageal sweep did note relatively significant statis, often to the level of the lower cervical/upper thoracic esophagus, placing patient at risk for post prandial aspiration. Discussed general reflux strategies with patient to mitigate risk. Will f/u briefly for reinforcement of strategies and education as needed.  SLP Visit Diagnosis Dysphagia, unspecified (R13.10) Attention and concentration deficit following -- Frontal lobe and executive function deficit following -- Impact on safety and function Moderate aspiration risk;Mild aspiration risk   CHL IP TREATMENT RECOMMENDATION 08/05/2018 Treatment Recommendations Therapy as outlined in treatment plan below   Prognosis 05/08/2018 Prognosis for Safe Diet Advancement Fair Barriers to Reach Goals (No Data) Barriers/Prognosis Comment -- CHL IP DIET RECOMMENDATION 08/05/2018 SLP Diet Recommendations Regular solids;Thin liquid Liquid Administration via Cup;Straw Medication Administration Whole meds with liquid Compensations Slow rate;Small sips/bites;Follow solids with liquid Postural Changes Remain semi-upright after after feeds/meals (Comment);Seated upright at 90 degrees   CHL IP OTHER RECOMMENDATIONS 08/05/2018 Recommended Consults -- Oral Care Recommendations Oral care BID Other  Recommendations --   CHL IP FOLLOW UP RECOMMENDATIONS 08/05/2018 Follow up Recommendations None   CHL IP FREQUENCY AND DURATION 08/05/2018 Speech Therapy Frequency (ACUTE ONLY) min 1 x/week Treatment Duration 1 week      CHL IP ORAL PHASE 08/05/2018 Oral Phase WFL Oral - Pudding Teaspoon -- Oral - Pudding Cup -- Oral - Honey Teaspoon -- Oral - Honey Cup -- Oral - Nectar Teaspoon -- Oral - Nectar Cup -- Oral - Nectar Straw -- Oral - Thin Teaspoon -- Oral - Thin Cup -- Oral - Thin Straw -- Oral - Puree -- Oral - Mech Soft -- Oral - Regular -- Oral - Multi-Consistency -- Oral - Pill -- Oral Phase - Comment --  CHL IP PHARYNGEAL PHASE 08/05/2018 Pharyngeal Phase WFL Pharyngeal- Pudding Teaspoon -- Pharyngeal -- Pharyngeal- Pudding Cup -- Pharyngeal -- Pharyngeal- Honey Teaspoon -- Pharyngeal -- Pharyngeal- Honey Cup -- Pharyngeal -- Pharyngeal- Nectar Teaspoon -- Pharyngeal -- Pharyngeal- Nectar Cup -- Pharyngeal -- Pharyngeal- Nectar Straw -- Pharyngeal -- Pharyngeal- Thin Teaspoon -- Pharyngeal -- Pharyngeal- Thin Cup -- Pharyngeal -- Pharyngeal- Thin Straw -- Pharyngeal -- Pharyngeal- Puree -- Pharyngeal -- Pharyngeal- Mechanical Soft -- Pharyngeal -- Pharyngeal- Regular -- Pharyngeal -- Pharyngeal- Multi-consistency -- Pharyngeal -- Pharyngeal- Pill -- Pharyngeal -- Pharyngeal Comment --  CHL IP CERVICAL ESOPHAGEAL PHASE 08/05/2018 Cervical Esophageal Phase WFL Pudding Teaspoon -- Pudding Cup -- Honey Teaspoon -- Honey Cup -- Nectar Teaspoon -- Nectar Cup -- Nectar Straw -- Thin Teaspoon -- Thin Cup -- Thin Straw -- Puree -- Mechanical Soft -- Regular -- Multi-consistency -- Pill -- Cervical Esophageal Comment -- Ferdinand Lango MA, CCC-SLP McCoy Leah Meryl 08/05/2018, 11:29 AM               Cardiac Studies   ECHO 02/2018: Severe asymmetric LVH.  Vigorous LV function with a EF of 65 to 70%.  GR 1 DD with high filling pressures.  Mild aortic calcification with no stenosis (sclerosis).  Calcified mitral annulus without  stenosis.  ECHO 2017: Ejection Fraction = >55%.The left ventricular wall motion is normal.There is mild mitral regurgitation.  NUCLEAR STRESS TEST 2015: Reportedly with mild inferolateral ischemia  Patient Profile     Adylee Cliftonis a 89 year oldfemalewith a history ofCAD, chronic diastolic heart failure, HTN, HLD,sick sinus syndrome s/p PPM (MDT), prior CVAs, DM,who is being seen today for the evaluation ofelevated troponinat the request of Dr. Silverio Lay.  Assessment & Plan    1. Elevated Troponin with History  of CAD - Patient has a history of a small NSTEMI but no record of cardiac catheterization  - Patient presented on 08/01/2018 with nonspecific episode of chest pain that was relieved with NTG. Troponin elevated x3 on 08/02/2018 but downtrending (0.23 >> 0.19 >> 0.18). Most likely demand ischemia in the setting of hypertension and diastolic heart failure with possible exacerbation by lack of oxygen (patient reported yesterday that her home oxygen takes had run out). - Nuclear Stress Test [phone] showed mild inferolateral ischemia. - Echo as below. - Patient denies any angina. Overnight, patient's O2 sats dropped to 82% while the patient was on 4L of supplemental O2 via nasal cannula. O2 was increased to 5L with no real improved, so patient was placed on Venturi Mask 35% which was then increased to 40%. Patient denies any difficulty breathing last night when her O2 sats dropped.  - Patient is currently back on 5L of supplemental O2 via nasal cannula. She denies any angina or dyspnea.  - Continue Aspirin 81mg  and Plavix 75mg  daily.  - Continue Toprol XL 100mg  daily.  - Continue Ranexa 500mg  twice daily. - Patient reportedly intolerant to statins.  Defer to primary cardiologist.  2. Accelerated Hypertension  - BP continues to be elevated. Most recent BP 148/87. - Continue Amlodipine 5mg  twice daily. - Continue Lisinopril 20mg  twice daily.  - Continue Toprol-XL as above.  3. Chronic  Diastolic Heart Failure  - Echo 02/25/2018 showed severe asymmetric hypertrophy of LV with EF of 65-70% and grade 1 diastolic dysfucntion; no wall motion abnormalities noted. - Patient's O2 sat dropped into the low 80s% last night. Crackles noted in bilaterally bases on exam. No significant peripheral edema. - Chest x-ray yesterday showed pulmonary vascular congestion with bilateral infiltrates (likely pulmonary edema).  Bibasilar atelectasis.  - Documented urine output of 1.5 L in the past 24 hours with a net of minus about 2.4 L since 08/02/2018. - Continue diuresis with IV Lasix 40mg  - have made BID. SCr stable at 0.90. Will continue to monitor renal function.  - Continue Toprol XL and Amlodipine as above.   - Continue to monitor daily weights and strict I's and O's.   4. Sick Sinus Syndrome s/p PPM - Stable. - Continue monitoring with telemetry.  5. PVCs - Telemetry reveals multiple PVCs. - Patient denies any palpitations.  - Continue Toprol-XL as above.  6. Diabetes Mellitus  - Hgb A1c 6.9 upon admission. - Per IM.     Signed, Corrin Parker, PA-C  08/05/2018, 12:14 PM    I have seen, examined and evaluated the patient this AM along with Marjie Skiff, PA-C.  After reviewing all the available data and chart, we discussed the patients laboratory, study & physical findings as well as symptoms in detail. I agree with her findings, examination as well as impression recommendations as per our discussion.    Attending adjustments noted in italics.   Ms. Gaddie was a little more short of breath last night and still was not breathing as well this morning.  Chest x-ray yesterday did suggest pulmonary edema.  I agree with continued IV Lasix.  We may have been a little premature and considering converting to p.o.  Could still consider converting to p.o. tomorrow afternoon.  Reassess in the morning.  Otherwise stable regimen.  I have added Imdur for additional preload  reduction.    Bryan Lemma, M.D., M.S. Interventional Cardiologist   Pager # [phone] Phone # [phone] [address] [place], [place]  [id number]   For questions or updates, please contact [facility] Please consult [email or web address] for contact info under

## 2018-08-05 NOTE — Progress Notes (Addendum)
While patient is sleeping O2 sats. on 4 liters N.C.drop to 82%. Patient is mouth breather. Inc. O2 to 5 liters N.C. Sats cont. To be 82-88%. Patient is resting comfortably no resp. Distress. Patient stated, "I am is fine."R.N. Aware and advise to put patient on Venti-mask 35% . O2 sats staying 88-90%

## 2018-08-05 NOTE — Progress Notes (Signed)
Patient sleeping and O2 sats ranging from 87-88% while sleeping. We no complaints voiced. R.N. Aware increase to 40 % Vent mask. Sats at 92 %

## 2018-08-06 DIAGNOSIS — E1149 Type 2 diabetes mellitus with other diabetic neurological complication: Secondary | ICD-10-CM

## 2018-08-06 DIAGNOSIS — I5031 Acute diastolic (congestive) heart failure: Secondary | ICD-10-CM

## 2018-08-06 LAB — BASIC METABOLIC PANEL
Anion gap: 11 (ref 5–15)
BUN: 19 mg/dL (ref 8–23)
CALCIUM: 8.2 mg/dL — AB (ref 8.9–10.3)
CO2: 31 mmol/L (ref 22–32)
CREATININE: 0.95 mg/dL (ref 0.44–1.00)
Chloride: 95 mmol/L — ABNORMAL LOW (ref 98–111)
GFR, EST AFRICAN AMERICAN: 58 mL/min — AB (ref >60)
GFR, EST NON AFRICAN AMERICAN: 50 mL/min — AB (ref >60)
Glucose, Bld: 187 mg/dL — ABNORMAL HIGH (ref 70–99)
Potassium: 3.7 mmol/L (ref 3.5–5.1)
Sodium: 137 mmol/L (ref 135–145)

## 2018-08-06 LAB — GLUCOSE, CAPILLARY
GLUCOSE-CAPILLARY: 188 mg/dL — AB (ref 70–99)
GLUCOSE-CAPILLARY: 180 mg/dL — AB (ref 70–99)
Glucose-Capillary: 145 mg/dL — ABNORMAL HIGH (ref 70–99)
Glucose-Capillary: 115 mg/dL — ABNORMAL HIGH (ref 70–99)

## 2018-08-06 NOTE — Progress Notes (Signed)
Patient sleeping and O2 sats on 4 liter N.C. down to 82%. patient is mouth breather. Enc D.B. And sats would come back up to 92%. No resp. Distress . Patient is resting comfort. Sats dropping again to low 80"s increase O2 to 5 liters N.C.  And sats back up 92-93% while awake. Cont. To monitor patient and resp. Status. R.N. aware

## 2018-08-06 NOTE — Progress Notes (Signed)
Physical Therapy Treatment Patient Details Name: Charlene Greene MRN: [id number] DOB: 01-Jan-1922 Today's Date: 08/06/2018    History of Present Illness Pt adm with chest pain. Likely demand ischemia likely due to elevated blood pressure and chronic diastolic heart failure. PMH - pacer, CVA's, HTN, dm, chf    PT Comments    Pt making steady progress with mobility. Continue to recommend return to ALF with HHPT at DC.   Follow Up Recommendations  Home health PT(at ALF)     Equipment Recommendations  None recommended by PT    Recommendations for Other Services       Precautions / Restrictions Precautions Precautions: Fall    Mobility  Bed Mobility               General bed mobility comments: Pt up in chair  Transfers Overall transfer level: Needs assistance Equipment used: 4-wheeled walker Transfers: Sit to/from Stand Sit to Stand: Mod assist         General transfer comment: Assist to bring hips up and verbal cues for hand placement  Ambulation/Gait Ambulation/Gait assistance: Min assist Gait Distance (Feet): 40 Feet(x 2) Assistive device: 4-wheeled walker Gait Pattern/deviations: Step-through pattern;Decreased step length - right;Decreased step length - left;Trunk flexed Gait velocity: decr Gait velocity interpretation: <1.8 ft/sec, indicate of risk for recurrent falls General Gait Details: Assist for balance and support. As pt fatigues balance worsens.   Stairs             Wheelchair Mobility    Modified Rankin (Stroke Patients Only)       Balance Overall balance assessment: Needs assistance Sitting-balance support: Bilateral upper extremity supported;Feet supported Sitting balance-Leahy Scale: Poor Sitting balance - Comments: UE support   Standing balance support: Bilateral upper extremity supported;During functional activity Standing balance-Leahy Scale: Poor Standing balance comment: walker and min assist for static standing                             Cognition Arousal/Alertness: Awake/alert Behavior During Therapy: WFL for tasks assessed/performed Overall Cognitive Status: Within Functional Limits for tasks assessed                                 General Comments: decr short term memory      Exercises      General Comments        Pertinent Vitals/Pain Pain Assessment: No/denies pain    Home Living                      Prior Function            PT Goals (current goals can now be found in the care plan section) Acute Rehab PT Goals Patient Stated Goal: return to ALF Progress towards PT goals: Progressing toward goals    Frequency    Min 3X/week      PT Plan Current plan remains appropriate    Co-evaluation              AM-PAC PT "6 Clicks" Daily Activity  Outcome Measure  Difficulty turning over in bed (including adjusting bedclothes, sheets and blankets)?: A Little Difficulty moving from lying on back to sitting on the side of the bed? : Unable Difficulty sitting down on and standing up from a chair with arms (e.g., wheelchair, bedside commode, etc,.)?: Unable Help needed moving to and from a bed  to chair (including a wheelchair)?: A Lot Help needed walking in hospital room?: A Little Help needed climbing 3-5 steps with a railing? : Total 6 Click Score: 11    End of Session Equipment Utilized During Treatment: Gait belt;Oxygen Activity Tolerance: Patient tolerated treatment well Patient left: with call bell/phone within reach;in chair Nurse Communication: Mobility status PT Visit Diagnosis: Unsteadiness on feet (R26.81);Other abnormalities of gait and mobility (R26.89);History of falling (Z91.81)     Time: 0981-1914 PT Time Calculation (min) (ACUTE ONLY): 19 min  Charges:  $Gait Training: 8-22 mins                     [facility] PT Acute Rehabilitation Services Pager [phone] Office [phone]    Angelina Ok [facility] 08/06/2018,  12:23 PM

## 2018-08-06 NOTE — Progress Notes (Addendum)
Progress Note  Patient Name: Charlene Greene Date of Encounter: 08/06/2018  Primary Cardiologist: Willeen [facility])  Subjective   Per nursing note, patient's O2 sats dropped again last night to 82% while she was on 4L of supplemental O2 via nasal cannula. Patient noted to be a mouth breather. She was encouraged to take some deep breaths, and her O2 sats came back up to 92%. O2 sats dropped back down into the low 80s so O2 was increased to 5L on nasal cannula. Patient denies any difficulty breathing overnight.   No chest pain, shortness of breath, palpitations, lightheadedness, or dizziness. Patient has no concerns at this time.   Inpatient Medications    Scheduled Meds: . amLODipine  5 mg Oral BID  . aspirin  81 mg Oral Daily  . clopidogrel  75 mg Oral Daily  . enoxaparin (LOVENOX) injection  40 mg Subcutaneous Q24H  . furosemide  40 mg Intravenous BID  . insulin aspart  0-15 Units Subcutaneous TID WC  . insulin aspart  0-5 Units Subcutaneous QHS  . isosorbide mononitrate  30 mg Oral Daily  . lisinopril  20 mg Oral BID  . mouth rinse  15 mL Mouth Rinse BID  . metoprolol succinate  100 mg Oral Daily  . ranolazine  500 mg Oral BID   Continuous Infusions:  PRN Meds: hydrALAZINE, ipratropium-albuterol, senna   Vital Signs    Vitals:   08/06/18 0431 08/06/18 0432 08/06/18 0433 08/06/18 0434  BP:      Pulse: 63 63 65 63  Resp: 15 17 17 17   Temp:      TempSrc:      SpO2: (!) 78% (!) 88% 91% 92%  Weight:      Height:        Intake/Output Summary (Last 24 hours) at 08/06/2018 0723 Last data filed at 08/06/2018 0600 Gross per 24 hour  Intake 440 ml  Output 100 ml  Net 340 ml   Filed Weights   08/01/18 1805 08/05/18 0454 08/06/18 0248  Weight: 77.1 kg 75.8 kg 75.1 kg    Telemetry    Atrial pacing with rate in 60s to 90s bpm and multiple PVCs - Personally Reviewed  Physical Exam   GEN: Elderly overweight female resting comfortably in hospital bed in no acute distress.  Currently on 4L of supplemental O2 via nasal cannula. Neck: Supple. Cardiac: RRR, no murmurs, rubs, or gallops.  Respiratory: No increased work of breathing. Normal respiratory rate. Crackles noted in bilateral bases, some improvement since yesterday.  GI: Abdomen soft, non-tender, and non-distended. MS: No lower extremity edema Neuro:  No focal deficits. Psych: Normal affect.  Labs    Chemistry Recent Labs  Lab 08/01/18 1817  08/04/18 0411 08/05/18 0416 08/06/18 0344  NA 138   < > 137 137 137  K 4.1   < > 3.6 4.0 3.7  CL 98   < > 95* 96* 95*  CO2 28   < > 31 30 31   GLUCOSE 284*   < > 182* 170* 187*  BUN 16   < > 20 19 19   CREATININE 0.82   < > 0.94 0.90 0.95  CALCIUM 8.7*   < > 8.4* 8.4* 8.2*  PROT 6.0*  --   --   --   --   ALBUMIN 3.3*  --   --   --   --   AST 25  --   --   --   --   ALT 27  --   --   --   --  ALKPHOS 88  --   --   --   --   BILITOT 0.8  --   --   --   --   GFRNONAA 60*   < > 51* 53* 50*  GFRAA >60   < > 59* >60 58*  ANIONGAP 12   < > 11 11 11    < > = values in this interval not displayed.     Hematology Recent Labs  Lab 08/01/18 1817 08/02/18 0315 08/03/18 0445  WBC 9.1 10.3 8.6  RBC 4.69 4.57 4.16  HGB 13.7 13.3 12.0  HCT 42.9 41.4 37.3  MCV 91.5 90.6 89.7  MCH 29.2 29.1 28.8  MCHC 31.9 32.1 32.2  RDW 15.1 14.8 14.6  PLT 198 195 167    Cardiac Enzymes Recent Labs  Lab 08/02/18 0315 08/02/18 0848 08/02/18 1351  TROPONINI 0.23* 0.19* 0.18*    Recent Labs  Lab 08/01/18 1821 08/01/18 2108  TROPIPOC 0.04 0.20*     BNP Recent Labs  Lab 08/02/18 0000  BNP 900.5*     DDimer No results for input(s): DDIMER in the last 168 hours.   Radiology    Dg Chest Port 1 View  Result Date: 08/04/2018 CLINICAL DATA:  Shortness of breath today, chest tightness EXAM: PORTABLE CHEST 1 VIEW COMPARISON:  Portable exam 0745 hours compared to 08/01/2018 FINDINGS: LEFT subclavian transvenous pacemaker with leads projecting over RIGHT atrium  and RIGHT ventricle. Enlargement of cardiac silhouette with vascular congestion. BILATERAL pulmonary infiltrates slightly greater on RIGHT, favor pulmonary edema. Increased RIGHT pleural effusion and bibasilar atelectasis. No pneumothorax. Bones demineralized with advanced BILATERAL glenohumeral degenerative changes with destruction of the LEFT humeral head noted. IMPRESSION: Persistent CHF with increased RIGHT pleural effusion and bibasilar atelectasis. Electronically Signed   By: Ulyses Southward M.D.   On: 08/04/2018 08:13   Dg Swallowing Func-speech Pathology  Result Date: 08/05/2018 Objective Swallowing Evaluation: Type of Study: MBS-Modified Barium Swallow Study  Patient Details Name: Charlene Greene MRN: [id number] Date of Birth: 04-25-1922 Today's Date: 08/05/2018 Time: SLP Start Time (ACUTE ONLY): 1034 -SLP Stop Time (ACUTE ONLY): 1050 SLP Time Calculation (min) (ACUTE ONLY): 16 min Past Medical History: Past Medical History: Diagnosis Date . CAD (coronary artery disease)  . Diabetes mellitus without complication (HCC)  . Hyperlipidemia  . Hypertension  . Pacemaker  . Renal artery stenosis (HCC)   s/p bilateral stents . Sick sinus syndrome (HCC)  . Stroke [facility])   2 previous cva's Past Surgical History: Past Surgical History: Procedure Laterality Date . PACEMAKER PLACEMENT  05/15/2014  MDT Advisa MRI implanted at [facility] for sick sinus syndrome HPI: Charlene Greene is a 84 y.o. female with medical history significant for CVA (2017 and 2019), dysphagia, sick sinus s/p pacemaker placement, RAS s/p stenting, HTN, DM, CKD, home O2 (2L), who presents w/ above. Per notes pt was on a group outing from her ALF and acutely developed substernal chest pain/pressure. Found to have elevated troponin and MD diagnosed pt likely had demand ischemia in the setting of hypertension and diastolic heart failure with possible exacerbation by lack of oxygen (patient reported yesterday that her home oxygen had run out). CXR Persistent CHF  with increased RIGHT pleural effusion and bibasilar atelectasis. MBS 05/08/18 with mild oropharyngeal dysphagia and suspect esophageal dysphagia; penetration to vocal cords suspected after the swallow. Barium tablet became lodged in vallecula and was cleared with follow-up puree bolus. Esophageal sweep revealed stasis of barium material to the mid-upper level of the esophagus  No  data recorded Assessment / Plan / Recommendation CHL IP CLINICAL IMPRESSIONS 08/05/2018 Clinical Impression Patient presents with what this SLP suspects is a primary esophageal dysphagia. Oropharyngeal phase largely normal with only intermittent flash penetration of thin liquids (normal/age appropriate). Patient with full airway protection. Esophageal sweep did note relatively significant statis, often to the level of the lower cervical/upper thoracic esophagus, placing patient at risk for post prandial aspiration. Discussed general reflux strategies with patient to mitigate risk. Will f/u briefly for reinforcement of strategies and education as needed.  SLP Visit Diagnosis Dysphagia, unspecified (R13.10) Attention and concentration deficit following -- Frontal lobe and executive function deficit following -- Impact on safety and function Moderate aspiration risk;Mild aspiration risk   CHL IP TREATMENT RECOMMENDATION 08/05/2018 Treatment Recommendations Therapy as outlined in treatment plan below   Prognosis 05/08/2018 Prognosis for Safe Diet Advancement Fair Barriers to Reach Goals (No Data) Barriers/Prognosis Comment -- CHL IP DIET RECOMMENDATION 08/05/2018 SLP Diet Recommendations Regular solids;Thin liquid Liquid Administration via Cup;Straw Medication Administration Whole meds with liquid Compensations Slow rate;Small sips/bites;Follow solids with liquid Postural Changes Remain semi-upright after after feeds/meals (Comment);Seated upright at 90 degrees   CHL IP OTHER RECOMMENDATIONS 08/05/2018 Recommended Consults -- Oral Care  Recommendations Oral care BID Other Recommendations --   CHL IP FOLLOW UP RECOMMENDATIONS 08/05/2018 Follow up Recommendations None   CHL IP FREQUENCY AND DURATION 08/05/2018 Speech Therapy Frequency (ACUTE ONLY) min 1 x/week Treatment Duration 1 week      CHL IP ORAL PHASE 08/05/2018 Oral Phase WFL Oral - Pudding Teaspoon -- Oral - Pudding Cup -- Oral - Honey Teaspoon -- Oral - Honey Cup -- Oral - Nectar Teaspoon -- Oral - Nectar Cup -- Oral - Nectar Straw -- Oral - Thin Teaspoon -- Oral - Thin Cup -- Oral - Thin Straw -- Oral - Puree -- Oral - Mech Soft -- Oral - Regular -- Oral - Multi-Consistency -- Oral - Pill -- Oral Phase - Comment --  CHL IP PHARYNGEAL PHASE 08/05/2018 Pharyngeal Phase WFL Pharyngeal- Pudding Teaspoon -- Pharyngeal -- Pharyngeal- Pudding Cup -- Pharyngeal -- Pharyngeal- Honey Teaspoon -- Pharyngeal -- Pharyngeal- Honey Cup -- Pharyngeal -- Pharyngeal- Nectar Teaspoon -- Pharyngeal -- Pharyngeal- Nectar Cup -- Pharyngeal -- Pharyngeal- Nectar Straw -- Pharyngeal -- Pharyngeal- Thin Teaspoon -- Pharyngeal -- Pharyngeal- Thin Cup -- Pharyngeal -- Pharyngeal- Thin Straw -- Pharyngeal -- Pharyngeal- Puree -- Pharyngeal -- Pharyngeal- Mechanical Soft -- Pharyngeal -- Pharyngeal- Regular -- Pharyngeal -- Pharyngeal- Multi-consistency -- Pharyngeal -- Pharyngeal- Pill -- Pharyngeal -- Pharyngeal Comment --  CHL IP CERVICAL ESOPHAGEAL PHASE 08/05/2018 Cervical Esophageal Phase WFL Pudding Teaspoon -- Pudding Cup -- Honey Teaspoon -- Honey Cup -- Nectar Teaspoon -- Nectar Cup -- Nectar Straw -- Thin Teaspoon -- Thin Cup -- Thin Straw -- Puree -- Mechanical Soft -- Regular -- Multi-consistency -- Pill -- Cervical Esophageal Comment -- Ferdinand Lango MA, CCC-SLP McCoy Leah Meryl 08/05/2018, 11:29 AM               Cardiac Studies    ECHO 02/2018:Severe asymmetric LVH. Vigorous LV function with a EF of 65 to 70%. GR 1 DD with high filling pressures. Mild aortic calcification with no stenosis (sclerosis).  Calcified mitral annulus without stenosis.  ECHO 2017:Ejection Fraction = >55%.The left ventricular wall motion is normal.There is mild mitral regurgitation.  NUCLEAR STRESS TEST 2015: Reportedly with mild inferolateral ischemia  Patient Profile     Charlene Greene a 89 year oldfemalewith a history ofCAD,chronic diastolic heart failure,HTN, HLD,sick  sinus syndrome s/p PPM (MDT), prior CVAs, DM,who is being seen today for the evaluation ofelevated troponinat the request of Dr. Silverio Lay.  Assessment & Plan    1. Elevated Troponin with History of CAD - Patient has a history of a small NSTEMI but no record of cardiac catheterization  - Patient presented on 08/01/2018 with nonspecific episode of chest pain that was relieved with NTG. Troponin elevated x3 on 08/02/2018 but downtrending (0.23 >> 0.19 >> 0.18).Most likelydemand ischemiain the setting of hypertension and diastolic heart failure with possible exacerbation by lack of oxygen (patient reported yesterday that her home oxygen takes had run out). - Nuclear Stress Test [phone] showed mild inferolateral ischemia. - Echo as below. - Overnight patient's O2 sats dropped 82% while on 4L of supplemental O2 via nasal canal. Patient was noted to be a mouth breathing. She was encouraged to take some deep breaths and her O2 sats came back up to 92%. O2 sats dropped to low 80s again so patient was increased to 5L on nasal cannula.  - Patient is currently back on 4L of supplemental O2 via nasal cannula. She denies any angina or dyspnea at this time. - Continue Aspirin 81mg andPlavix 75mg  daily.  - Continue Toprol XL 100mg  daily.  - Continue Ranexa 500mg  twice daily. - Patient reportedly intolerant to statins.Defer to primary cardiologist.  2. Accelerated Hypertension - BP continues to be elevated. Most recent BP 154/69. - Continue Amlodipine 5mg  twice daily. - Continue Lisinopril 20mg  twice daily.  - Continue Imdur 30mg  daily.  -  Continue Toprol-XLas above.  3. Chronic Diastolic Heart Failure - Echo 9/60/4540 showed severe asymmetric hypertrophy of LV with EF of 65-70% and grade 1 diastolic dysfucntion; no wall motion abnormalities noted. -Patient's O2 sat dropped into the low 80s% last night. Crackles noted in bilaterally bases on exam. Nosignificant peripheral edema. - Chest x-rayon 08/04/2018 showedpulmonary vascular congestion with bilateral infiltrates (likely pulmonary edema). Bibasilar atelectasis. - Continue diuresis with IV Lasix 40mg  twice daily for today. Plan to transition to PO tomorrow - 80 mg PO Daily.. - SCr stable at 0.95. Will continue to monitor renal function.  - Continue Toprol XL and Amlodipine as above. - Continue Imdur 30mg  daily. - Continue to monitor daily weights and strict I's and O's.   4. Sick Sinus Syndrome s/p PPM - Stable. - Continue monitoring with telemetry.  5. PVCs - Telemetry reveals multiple PVCs.  - Patient denies any palpitations.  - Continue Toprol-XL as above.  6. Diabetes Mellitus  - Hgb A1c 6.9 upon admission. - Per IM.  For questions or updates, please contact [facility] Please consult [email or web address] for contact info under        Signed, Corrin Parker, PA-C  08/06/2018, 7:23 AM     I have seen, examined and evaluated the patient this AM along with Marjie Skiff, PA-C.  After reviewing all the available data and chart, we discussed the patients laboratory, study & physical findings as well as symptoms in detail. I agree with her findings, examination as well as impression recommendations as per our discussion.     She looks and feels much better today.  Still has decreased breath sounds in the bases but much improved.  Blood pressure is little high today, but relatively stable.  Can increase Imdur but otherwise no good options other than converting from Toprol to carvedilol.  For now we will just simply plan to convert to oral Lasix  tomorrow anticipate that she should be ready for  discharge soon.    Bryan Lemma, M.D., M.S. Interventional Cardiologist   Pager # [phone] Phone # [phone] [address] [place], [place] [address]

## 2018-08-06 NOTE — Progress Notes (Signed)
Patient Demographics:    Charlene Greene, is a 84 y.o. female, DOB - 12/29/1921, ZOX:[id number]  Admit date - 08/01/2018   Admitting Physician Osvaldo Shipper, MD  Outpatient Primary MD for the patient is [facility]  LOS - 4   Chief Complaint  Patient presents with  . Chest Pain        Subjective:    Charlene Greene today has no fevers, no emesis,  No chest pain, shortness of breath persist,  Assessment  & Plan :    Principal Problem:   Non-ST elevation (NSTEMI) myocardial infarction [facility]) Active Problems:   CVA (cerebral vascular accident) (HCC)   Accelerated hypertension   Diabetes mellitus (HCC)   Sick sinus syndrome (HCC)   Stage 3 chronic kidney disease (HCC)   Acute on chronic diastolic congestive heart failure (HCC)   Oxygen dependent   Elevated troponin  Brief Summary 89 year oldfemalewith a history ofCAD,chronic diastolic heart failure,HTN, HLD,sick sinus syndrome s/p PPM (MDT), prior CVAs, DM admitted on 08/02/2018 with worsening hypoxia and oxygen requirement and found to have  elevated troponin/NSTEMI    Plan:- 1)NSTEMI in the patient with history of CAD, RAS s/p stenting, HFpEF,  EF is 55 to 70% with grade 1 diastolic dysfunction, cardiology consult elevated troponin likely due to mild ischemia in the setting of diastolic dysfunction CHF exacerbation and hypertension, prior to admission patient was found to liters of oxygen based oxygen around out the assisted living facility so he was hypoxic for a while, continue aspirin 81 mg and Plavix 75 mg daily, continue Ranexa 500 mg twice daily  2)HFpEF----acute on chronic diastolic dysfunction CHF exacerbation, EF as noted above #1, oxygen requirement remains above baseline, episodes of hypoxia despite oxygen supplementation persists,  mostly left-sided heart failure symptoms, no significant lower extremity edema, treat with  Lasix IV 40 mg twice daily, possible transition to oral Lasix in a day or 2 if continues to do well, isosorbide and metoprolol XL as ordered  3)HTN--- improving BP control, continue amlodipine 5 mg twice daily, lisinopril 20 mg twice daily, isosorbide 30 mg daily and Toprol-XL, IV hydralazine as needed elevated BP,  4)DM2-good control, A1c 6.9, Use Novolog/Humalog Sliding scale insulin with Accu-Cheks/Fingersticks as ordered   5) sick sinus syndrome/status post pacemaker placement--  Disposition/Need for in-Hospital Stay- patient unable to be discharged at this time due to diastolic CHF exacerbation requiring increased oxygen supplementation, iv diuresis and need for monitoring of renal function and hemodynamics with IV diuresis  Code Status : Full   Disposition Plan  : ALF with HH  Consults  : Cardiology   DVT Prophylaxis  :  Lovenox -   Lab Results  Component Value Date   PLT 167 08/03/2018    Inpatient Medications  Scheduled Meds: . amLODipine  5 mg Oral BID  . aspirin  81 mg Oral Daily  . clopidogrel  75 mg Oral Daily  . enoxaparin (LOVENOX) injection  40 mg Subcutaneous Q24H  . furosemide  40 mg Intravenous BID  . insulin aspart  0-15 Units Subcutaneous TID WC  . insulin aspart  0-5 Units Subcutaneous QHS  . isosorbide mononitrate  30 mg Oral Daily  . lisinopril  20 mg Oral BID  . mouth rinse  15 mL Mouth Rinse BID  . metoprolol succinate  100 mg Oral Daily  . ranolazine  500 mg Oral BID   Continuous Infusions: PRN Meds:.hydrALAZINE, ipratropium-albuterol, senna    Anti-infectives (From admission, onward)   Start     Dose/Rate Route Frequency Ordered Stop   08/01/18 1945  cephALEXin (KEFLEX) capsule 500 mg  Status:  Discontinued     500 mg Oral  Once 08/01/18 1938 08/01/18 2012        Objective:   Vitals:   08/06/18 0434 08/06/18 0825 08/06/18 1135 08/06/18 1657  BP:  (!) 154/69 (!) 168/82 (!) 141/53  Pulse: 63 66 (!) 37 73  Resp: 17 18 18 17   Temp:   97.9 F (36.6 C) 98 F (36.7 C) 97.8 F (36.6 C)  TempSrc:  Oral Oral Oral  SpO2: 92% 97% 100% 96%  Weight:      Height:        Wt Readings from Last 3 Encounters:  08/06/18 75.1 kg  05/14/18 71 kg  03/13/18 74.1 kg     Intake/Output Summary (Last 24 hours) at 08/06/2018 1828 Last data filed at 08/06/2018 1700 Gross per 24 hour  Intake 740 ml  Output 1000 ml  Net -260 ml     Physical Exam Patient is examined daily including today on 08/06/18 , exams remain the same as of yesterday except that has changed   Gen:- Awake Alert, resting comfortably HEENT:- [place].AT, No sclera icterus Nose-4 L of oxygen via nasal cannula Neck-Supple Neck, JVD,.  Lungs-bibasilar rales, no wheezing  CV- S1, S2 normal, regular Abd-  +ve B.Sounds, Abd Soft, No tenderness,    Extremity/Skin:- No  edema,   +ve pulses Psych-affect is appropriate, oriented x3 Neuro-no new focal deficits, no tremors   Data Review:   Micro Results No results found for this or any previous visit (from the past 240 hour(s)).  Radiology Reports Dg Chest Port 1 View  Result Date: 08/04/2018 CLINICAL DATA:  Shortness of breath today, chest tightness EXAM: PORTABLE CHEST 1 VIEW COMPARISON:  Portable exam 0745 hours compared to 08/01/2018 FINDINGS: LEFT subclavian transvenous pacemaker with leads projecting over RIGHT atrium and RIGHT ventricle. Enlargement of cardiac silhouette with vascular congestion. BILATERAL pulmonary infiltrates slightly greater on RIGHT, favor pulmonary edema. Increased RIGHT pleural effusion and bibasilar atelectasis. No pneumothorax. Bones demineralized with advanced BILATERAL glenohumeral degenerative changes with destruction of the LEFT humeral head noted. IMPRESSION: Persistent CHF with increased RIGHT pleural effusion and bibasilar atelectasis. Electronically Signed   By: Ulyses Southward M.D.   On: 08/04/2018 08:13   Dg Chest Port 1 View  Result Date: 08/01/2018 CLINICAL DATA:  Central chest pain  and dyspnea EXAM: PORTABLE CHEST 1 VIEW COMPARISON:  05/11/2018 FINDINGS: Stable cardiomegaly with aortic atherosclerosis. Left-sided pacemaker apparatus with leads in the right atrium and right ventricle appear stable. Trace right effusion with mild interstitial edema similar to prior. No pneumothorax. Degenerative change noted of both shoulders and dorsal spine. IMPRESSION: Stable cardiomegaly with aortic atherosclerosis. Trace right effusion with mild interstitial edema. Findings are similar to that seen on prior exam. Electronically Signed   By: Tollie Eth M.D.   On: 08/01/2018 18:31   Dg Swallowing Func-speech Pathology  Result Date: 08/05/2018 Objective Swallowing Evaluation: Type of Study: MBS-Modified Barium Swallow Study  Patient Details Name: Charlene Greene MRN: [id number] Date of Birth: 1922-10-22 Today's Date: 08/05/2018 Time: SLP Start Time (ACUTE ONLY): 1034 -SLP Stop Time (ACUTE ONLY): 1050 SLP Time Calculation (min) (ACUTE ONLY):  16 min Past Medical History: Past Medical History: Diagnosis Date . CAD (coronary artery disease)  . Diabetes mellitus without complication (HCC)  . Hyperlipidemia  . Hypertension  . Pacemaker  . Renal artery stenosis (HCC)   s/p bilateral stents . Sick sinus syndrome (HCC)  . Stroke [facility])   2 previous cva's Past Surgical History: Past Surgical History: Procedure Laterality Date . PACEMAKER PLACEMENT  05/15/2014  MDT Advisa MRI implanted at [facility] for sick sinus syndrome HPI: Charlene Greene is a 84 y.o. female with medical history significant for CVA (2017 and 2019), dysphagia, sick sinus s/p pacemaker placement, RAS s/p stenting, HTN, DM, CKD, home O2 (2L), who presents w/ above. Per notes pt was on a group outing from her ALF and acutely developed substernal chest pain/pressure. Found to have elevated troponin and MD diagnosed pt likely had demand ischemia in the setting of hypertension and diastolic heart failure with possible exacerbation by lack of oxygen (patient  reported yesterday that her home oxygen had run out). CXR Persistent CHF with increased RIGHT pleural effusion and bibasilar atelectasis. MBS 05/08/18 with mild oropharyngeal dysphagia and suspect esophageal dysphagia; penetration to vocal cords suspected after the swallow. Barium tablet became lodged in vallecula and was cleared with follow-up puree bolus. Esophageal sweep revealed stasis of barium material to the mid-upper level of the esophagus  No data recorded Assessment / Plan / Recommendation CHL IP CLINICAL IMPRESSIONS 08/05/2018 Clinical Impression Patient presents with what this SLP suspects is a primary esophageal dysphagia. Oropharyngeal phase largely normal with only intermittent flash penetration of thin liquids (normal/age appropriate). Patient with full airway protection. Esophageal sweep did note relatively significant statis, often to the level of the lower cervical/upper thoracic esophagus, placing patient at risk for post prandial aspiration. Discussed general reflux strategies with patient to mitigate risk. Will f/u briefly for reinforcement of strategies and education as needed.  SLP Visit Diagnosis Dysphagia, unspecified (R13.10) Attention and concentration deficit following -- Frontal lobe and executive function deficit following -- Impact on safety and function Moderate aspiration risk;Mild aspiration risk   CHL IP TREATMENT RECOMMENDATION 08/05/2018 Treatment Recommendations Therapy as outlined in treatment plan below   Prognosis 05/08/2018 Prognosis for Safe Diet Advancement Fair Barriers to Reach Goals (No Data) Barriers/Prognosis Comment -- CHL IP DIET RECOMMENDATION 08/05/2018 SLP Diet Recommendations Regular solids;Thin liquid Liquid Administration via Cup;Straw Medication Administration Whole meds with liquid Compensations Slow rate;Small sips/bites;Follow solids with liquid Postural Changes Remain semi-upright after after feeds/meals (Comment);Seated upright at 90 degrees   CHL IP OTHER  RECOMMENDATIONS 08/05/2018 Recommended Consults -- Oral Care Recommendations Oral care BID Other Recommendations --   CHL IP FOLLOW UP RECOMMENDATIONS 08/05/2018 Follow up Recommendations None   CHL IP FREQUENCY AND DURATION 08/05/2018 Speech Therapy Frequency (ACUTE ONLY) min 1 x/week Treatment Duration 1 week      CHL IP ORAL PHASE 08/05/2018 Oral Phase WFL Oral - Pudding Teaspoon -- Oral - Pudding Cup -- Oral - Honey Teaspoon -- Oral - Honey Cup -- Oral - Nectar Teaspoon -- Oral - Nectar Cup -- Oral - Nectar Straw -- Oral - Thin Teaspoon -- Oral - Thin Cup -- Oral - Thin Straw -- Oral - Puree -- Oral - Mech Soft -- Oral - Regular -- Oral - Multi-Consistency -- Oral - Pill -- Oral Phase - Comment --  CHL IP PHARYNGEAL PHASE 08/05/2018 Pharyngeal Phase WFL Pharyngeal- Pudding Teaspoon -- Pharyngeal -- Pharyngeal- Pudding Cup -- Pharyngeal -- Pharyngeal- Honey Teaspoon -- Pharyngeal -- Pharyngeal- Honey Cup --  Pharyngeal -- Pharyngeal- Nectar Teaspoon -- Pharyngeal -- Pharyngeal- Nectar Cup -- Pharyngeal -- Pharyngeal- Nectar Straw -- Pharyngeal -- Pharyngeal- Thin Teaspoon -- Pharyngeal -- Pharyngeal- Thin Cup -- Pharyngeal -- Pharyngeal- Thin Straw -- Pharyngeal -- Pharyngeal- Puree -- Pharyngeal -- Pharyngeal- Mechanical Soft -- Pharyngeal -- Pharyngeal- Regular -- Pharyngeal -- Pharyngeal- Multi-consistency -- Pharyngeal -- Pharyngeal- Pill -- Pharyngeal -- Pharyngeal Comment --  CHL IP CERVICAL ESOPHAGEAL PHASE 08/05/2018 Cervical Esophageal Phase WFL Pudding Teaspoon -- Pudding Cup -- Honey Teaspoon -- Honey Cup -- Nectar Teaspoon -- Nectar Cup -- Nectar Straw -- Thin Teaspoon -- Thin Cup -- Thin Straw -- Puree -- Mechanical Soft -- Regular -- Multi-consistency -- Pill -- Cervical Esophageal Comment -- Ferdinand Lango MA, CCC-SLP McCoy Leah Meryl 08/05/2018, 11:29 AM                CBC Recent Labs  Lab 08/01/18 1817 08/02/18 0315 08/03/18 0445  WBC 9.1 10.3 8.6  HGB 13.7 13.3 12.0  HCT 42.9 41.4 37.3  PLT 198  195 167  MCV 91.5 90.6 89.7  MCH 29.2 29.1 28.8  MCHC 31.9 32.1 32.2  RDW 15.1 14.8 14.6  LYMPHSABS 0.8  --   --   MONOABS 0.6  --   --   EOSABS 0.0  --   --   BASOSABS 0.0  --   --     Chemistries  Recent Labs  Lab 08/01/18 1817 08/02/18 0315 08/04/18 0411 08/05/18 0416 08/06/18 0344  NA 138 139 137 137 137  K 4.1 3.7 3.6 4.0 3.7  CL 98 98 95* 96* 95*  CO2 28 28 31 30 31   GLUCOSE 284* 194* 182* 170* 187*  BUN 16 13 20 19 19   CREATININE 0.82 0.75 0.94 0.90 0.95  CALCIUM 8.7* 8.6* 8.4* 8.4* 8.2*  AST 25  --   --   --   --   ALT 27  --   --   --   --   ALKPHOS 88  --   --   --   --   BILITOT 0.8  --   --   --   --    ------------------------------------------------------------------------------------------------------------------ No results for input(s): CHOL, HDL, LDLCALC, TRIG, CHOLHDL, LDLDIRECT in the last 72 hours.  Lab Results  Component Value Date   HGBA1C 6.9 (H) 08/02/2018   ------------------------------------------------------------------------------------------------------------------ No results for input(s): TSH, T4TOTAL, T3FREE, THYROIDAB in the last 72 hours.  Invalid input(s): FREET3 ------------------------------------------------------------------------------------------------------------------ No results for input(s): VITAMINB12, FOLATE, FERRITIN, TIBC, IRON, RETICCTPCT in the last 72 hours.  Coagulation profile No results for input(s): INR, PROTIME in the last 168 hours.  No results for input(s): DDIMER in the last 72 hours.  Cardiac Enzymes Recent Labs  Lab 08/02/18 0315 08/02/18 0848 08/02/18 1351  TROPONINI 0.23* 0.19* 0.18*   ------------------------------------------------------------------------------------------------------------------    Component Value Date/Time   BNP 900.5 (H) 08/02/2018 0000    Shon Hale M.D on 08/06/2018 at 6:28 PM  Pager---[phone] Go to [email or web address] - [email or web address] for contact info  [facility] - Office  [phone]

## 2018-08-07 ENCOUNTER — Telehealth: Payer: Self-pay | Admitting: Cardiology

## 2018-08-07 LAB — BASIC METABOLIC PANEL
Anion gap: 10 (ref 5–15)
BUN: 16 mg/dL (ref 8–23)
CALCIUM: 8.3 mg/dL — AB (ref 8.9–10.3)
CHLORIDE: 95 mmol/L — AB (ref 98–111)
CO2: 34 mmol/L — ABNORMAL HIGH (ref 22–32)
CREATININE: 0.89 mg/dL (ref 0.44–1.00)
GFR calc non Af Amer: 54 mL/min — ABNORMAL LOW (ref >60)
Glucose, Bld: 135 mg/dL — ABNORMAL HIGH (ref 70–99)
Potassium: 3.5 mmol/L (ref 3.5–5.1)
SODIUM: 139 mmol/L (ref 135–145)

## 2018-08-07 LAB — GLUCOSE, CAPILLARY
GLUCOSE-CAPILLARY: 159 mg/dL — AB (ref 70–99)
Glucose-Capillary: 138 mg/dL — ABNORMAL HIGH (ref 70–99)
Glucose-Capillary: 227 mg/dL — ABNORMAL HIGH (ref 70–99)

## 2018-08-07 MED ORDER — FUROSEMIDE 80 MG PO TABS: 80.0000 mg | ORAL_TABLET | Freq: Every day | ORAL | 3 refills | Status: DC

## 2018-08-07 MED ORDER — ASPIRIN 81 MG PO CHEW: 81.0000 mg | CHEWABLE_TABLET | Freq: Every day | ORAL | 2 refills | Status: AC

## 2018-08-07 MED ORDER — FUROSEMIDE 80 MG PO TABS: 80.0000 mg | ORAL_TABLET | Freq: Every day | ORAL | Status: DC

## 2018-08-07 MED ORDER — SENNA 8.6 MG PO TABS: 2.0000 | ORAL_TABLET | Freq: Every day | ORAL | 0 refills | Status: AC

## 2018-08-07 NOTE — Discharge Summary (Signed)
Charlene Greene, is a 84 y.o. female  DOB 26-Jul-1922  MRN [id number].  Admission date:  08/01/2018  Admitting Physician  Osvaldo Shipper, MD  Discharge Date:  08/07/2018   Primary MD  Housecalls, Doctors Making  Recommendations for primary care physician for things to follow:   1)Very low-salt diet advised 2)Weigh yourself daily, call if you gain more than 3 pounds in 1 day or more than 5 pounds in 1 week as your diuretic medications may need to be adjusted 3)Limit your Fluid  intake to no more than 60 ounces (1.8 Liters) per day 4) continue supplemental oxygen via nasal cannula at 2 L/min as ordered   Admission Diagnosis  CP  Vtach   Discharge Diagnosis  CP  Vtach    Principal Problem:   Non-ST elevation (NSTEMI) myocardial infarction [facility]) Active Problems:   CVA (cerebral vascular accident) (HCC)   Accelerated hypertension   Diabetes mellitus (HCC)   Sick sinus syndrome (HCC)   Stage 3 chronic kidney disease (HCC)   Acute on chronic diastolic congestive heart failure (HCC)   Oxygen dependent   Elevated troponin      Past Medical History:  Diagnosis Date  . CAD (coronary artery disease)   . Diabetes mellitus without complication (HCC)   . Hyperlipidemia   . Hypertension   . Pacemaker   . Renal artery stenosis (HCC)    s/p bilateral stents  . Sick sinus syndrome (HCC)   . Stroke [facility])    2 previous cva's    Past Surgical History:  Procedure Laterality Date  . PACEMAKER PLACEMENT  05/15/2014   MDT Advisa MRI implanted at [facility] for sick sinus syndrome      HPI  from the history and physical done on the day of admission:    Patient coming from: ALF   Chief Complaint: chest pain  HPI: Charlene Greene is a 84 y.o. female with medical history significant for CVA (including recent), sick sinus s/p pacemaker placement, RAS s/p stenting, HTN, DM, CKD, home O2 (2L), who presents w/  above.  Was on a group outing from her ALF, riding in a bus today, when acutely developed substernal chest pain/pressure. Persisted when arrived back at her ALF, when she notified nursing, who alerted EMS. EMS gave nitro and amiodarone (appears mistakenly diagnosed v tach) as well as asa. On arrival chest pain free. No cough or sob. No fevers. No history MI or cardiac stenting. Did have a nuclear stress in 2015 that showed mild ischemia. TTE 02/2018 showed normal ef w/ grade 1 diastolic dysfunction. She has LE edema which she thinks is at baseline. She has no new shortness of breath.  Several recent hospitalizations: CVA in 2017, again 02/2018, and admission 05/2018 for cap/chf.   ED Course: initial troponin wnl, second troponin 0.2. Cardiology consulted, heparin drip ordered. Home bp medications ordered.   Hospital Course:    Principal Problem:   Non-ST elevation (NSTEMI) myocardial infarction [facility]) Active Problems:   CVA (cerebral vascular accident) (HCC)   Accelerated  hypertension   Diabetes mellitus (HCC)   Sick sinus syndrome (HCC)   Stage 3 chronic kidney disease (HCC)   Acute on chronic diastolic congestive heart failure (HCC)   Oxygen dependent   Elevated troponin  Brief Summary 89 year oldfemalewith a history ofCAD,chronic diastolic heart failure,HTN, HLD,sick sinus syndrome s/p PPM (MDT), prior CVAs, DM admitted on 08/02/2018 with worsening hypoxia and oxygen requirement and found to have  elevated troponin/NSTEMI    Plan:- 1)NSTEMI---- in a patient with history of CAD, RAS s/p stenting, HFpEF,  EF is 55 to 70% with grade 1 diastolic dysfunction, cardiology consult elevated troponin likely due to mild ischemia in the setting of diastolic dysfunction CHF exacerbation and hypertension, prior to admission patient was on 2L/min of oxygen, she apparently ran out of oxygen at the assisted living facility so he was hypoxic for a while, continue aspirin 81 mg and Plavix 75 mg  daily, continue Ranexa 500 mg twice daily, isosorbide 90 mg daily, metoprolol XL 100 mg daily  2)HFpEF----acute on chronic diastolic dysfunction CHF exacerbation, EF as noted above #1, overall much improved with IV diuresis, oxygen requirement is back to baseline, dyspnea on exertion is improved significantly, no shortness of breath at rest,   mostly left-sided heart failure symptoms, no significant lower extremity edema, cardiology recommends discharge on Lasix 80 mg daily, continue isosorbide and metoprolol XL   3)HTN--- improved BP control, continue amlodipine 5 mg twice daily, lisinopril 20 mg twice daily, isosorbide 30 mg daily and Toprol-XL,   4)DM2-good control, A1c 6.9, Amaryl 1 mg with breakfast as ordered   5)Sick Sinus Syndrome/Status post pacemaker placement--cardiology input appreciated, continue metoprolol XL 100 mg daily   Code Status : Full  Disposition Plan  : ALF with HH RN and PT, discussed with patient's daughter-in-law Angelique Blonder who is a Engineer, civil (consulting) prior to discharge to ALF, questions answered  Consults  : Cardiology  Discharge Condition:   Follow UP   Contact information for follow-up providers    [facility] Follow up.   Specialty:  Cardiology Why:  Appointment scheduled for Monday August 18, 2018 at 9:30am with Bailey Mech, DNP, ANP Contact information: [address] [place] [address] [phone]           Contact information for after-discharge care    Destination    [facility] .   Service:  Assisted Living Contact information: [address] [place] [address] [phone]                  Diet and Activity recommendation:  As advised  Discharge Instructions     Discharge Instructions    (HEART FAILURE PATIENTS) Call MD:  Anytime you have any of the following symptoms: 1) 3 pound weight gain in 24 hours or 5 pounds in 1 week 2) shortness of  breath, with or without a dry hacking cough 3) swelling in the hands, feet or stomach 4) if you have to sleep on extra pillows at night in order to breathe.   Complete by:  As directed    Call MD for:  difficulty breathing, headache or visual disturbances   Complete by:  As directed    Call MD for:  persistant dizziness or light-headedness   Complete by:  As directed    Call MD for:  persistant nausea and vomiting   Complete by:  As directed    Call MD for:  severe uncontrolled pain   Complete by:  As directed    Call MD for:  temperature >100.4   Complete by:  As directed    Diet - low sodium heart healthy   Complete by:  As directed    Discharge instructions   Complete by:  As directed    1)Very low-salt diet advised 2)Weigh yourself daily, call if you gain more than 3 pounds in 1 day or more than 5 pounds in 1 week as your diuretic medications may need to be adjusted 3)Limit your Fluid  intake to no more than 60 ounces (1.8 Liters) per day 4) continue supplemental oxygen via nasal cannula at 2 L/min as ordered   Increase activity slowly   Complete by:  As directed         Discharge Medications     Allergies as of 08/07/2018      Reactions   Statins Other (See Comments)   Muscle weakness/pain   Other Other (See Comments)   Reductase Inhibitors- "Allergic," per MAR/reaction not recalled by the patient "Dye"- "Allergic," per MAR/reaction not recalled by the patient   Amlodipine Other (See Comments)   Unknown   Celecoxib Nausea Only, Other (See Comments)   Codeine Other (See Comments)   "Allergic," per MAR/reaction not recalled by the patient   Naproxen Other (See Comments)   Unknown   Piroxicam Other (See Comments)   "Allergic," per MAR/reaction not recalled by the patient      Medication List    STOP taking these medications   triamterene-hydrochlorothiazide 37.5-25 MG tablet Commonly known as:  MAXZIDE-25     TAKE these medications   acetaminophen 500 MG  tablet Commonly known as:  TYLENOL Take 500 mg by mouth See admin instructions. Take 500 mg by mouth two times a day and an additional 500 mg every 12 hours as needed for pain or fever- NOT TO EXCEED 2 "AS NEEDED" TABLETS IN 24 HOURS   amLODipine 5 MG tablet Commonly known as:  NORVASC Take 1 tablet (5 mg total) by mouth 2 (two) times daily.   aspirin 81 MG chewable tablet Chew 1 tablet (81 mg total) by mouth daily. With Food Start taking on:  08/08/2018   clopidogrel 75 MG tablet Commonly known as:  PLAVIX Take 1 tablet (75 mg total) by mouth daily.   dextromethorphan-guaiFENesin 30-600 MG 12hr tablet Commonly known as:  MUCINEX DM Take 1 tablet by mouth 2 (two) times daily.   feeding supplement (ENSURE ENLIVE) Liqd Take 237 mLs by mouth 2 (two) times daily between meals.   furosemide 80 MG tablet Commonly known as:  LASIX Take 1 tablet (80 mg total) by mouth daily. What changed:    medication strength  how much to take   glimepiride 1 MG tablet Commonly known as:  AMARYL Take 1 tablet (1 mg total) by mouth daily with breakfast.   Ipratropium-Albuterol 20-100 MCG/ACT Aers respimat Commonly known as:  COMBIVENT Inhale 2 puffs into the lungs 2 (two) times daily. For 2 weeks   isosorbide mononitrate 30 MG 24 hr tablet Commonly known as:  IMDUR Take 30 mg by mouth daily.   isosorbide mononitrate 60 MG 24 hr tablet Commonly known as:  IMDUR Take 1 tablet (60 mg total) by mouth daily.   lisinopril 20 MG tablet Commonly known as:  PRINIVIL,ZESTRIL Take 1 tablet (20 mg total) by mouth 2 (two) times daily.   Magnesium 250 MG Tabs Take 250 mg by mouth at bedtime.   metoprolol succinate 100 MG 24 hr tablet  Commonly known as:  TOPROL-XL Take 1 tablet (100 mg total) by mouth daily.   multivitamin with minerals Tabs tablet Take 1 tablet by mouth daily.   nitroGLYCERIN 0.4 MG SL tablet Commonly known as:  NITROSTAT Place 0.4 mg under the tongue every 5 (five) minutes  x 3 doses as needed for chest pain (and call 9-1-1 and MD, if no relief).   ranolazine 500 MG 12 hr tablet Commonly known as:  RANEXA Take 1 tablet (500 mg total) by mouth 2 (two) times daily.   senna 8.6 MG Tabs tablet Commonly known as:  SENOKOT Take 2 tablets (17.2 mg total) by mouth at bedtime. What changed:    how much to take  when to take this  reasons to take this   Vitamin D-3 1000 units Caps Take 1,000 Units by mouth daily.       Major procedures and Radiology Reports - PLEASE review detailed and final reports for all details, in brief -   Dg Chest Port 1 View  Result Date: 08/04/2018 CLINICAL DATA:  Shortness of breath today, chest tightness EXAM: PORTABLE CHEST 1 VIEW COMPARISON:  Portable exam 0745 hours compared to 08/01/2018 FINDINGS: LEFT subclavian transvenous pacemaker with leads projecting over RIGHT atrium and RIGHT ventricle. Enlargement of cardiac silhouette with vascular congestion. BILATERAL pulmonary infiltrates slightly greater on RIGHT, favor pulmonary edema. Increased RIGHT pleural effusion and bibasilar atelectasis. No pneumothorax. Bones demineralized with advanced BILATERAL glenohumeral degenerative changes with destruction of the LEFT humeral head noted. IMPRESSION: Persistent CHF with increased RIGHT pleural effusion and bibasilar atelectasis. Electronically Signed   By: Ulyses Southward M.D.   On: 08/04/2018 08:13   Dg Chest Port 1 View  Result Date: 08/01/2018 CLINICAL DATA:  Central chest pain and dyspnea EXAM: PORTABLE CHEST 1 VIEW COMPARISON:  05/11/2018 FINDINGS: Stable cardiomegaly with aortic atherosclerosis. Left-sided pacemaker apparatus with leads in the right atrium and right ventricle appear stable. Trace right effusion with mild interstitial edema similar to prior. No pneumothorax. Degenerative change noted of both shoulders and dorsal spine. IMPRESSION: Stable cardiomegaly with aortic atherosclerosis. Trace right effusion with mild  interstitial edema. Findings are similar to that seen on prior exam. Electronically Signed   By: Tollie Eth M.D.   On: 08/01/2018 18:31   Dg Swallowing Func-speech Pathology  Result Date: 08/05/2018 Objective Swallowing Evaluation: Type of Study: MBS-Modified Barium Swallow Study  Patient Details Name: Shakyla Nolley MRN: [id number] Date of Birth: 09-23-1922 Today's Date: 08/05/2018 Time: SLP Start Time (ACUTE ONLY): 1034 -SLP Stop Time (ACUTE ONLY): 1050 SLP Time Calculation (min) (ACUTE ONLY): 16 min Past Medical History: Past Medical History: Diagnosis Date . CAD (coronary artery disease)  . Diabetes mellitus without complication (HCC)  . Hyperlipidemia  . Hypertension  . Pacemaker  . Renal artery stenosis (HCC)   s/p bilateral stents . Sick sinus syndrome (HCC)  . Stroke [facility])   2 previous cva's Past Surgical History: Past Surgical History: Procedure Laterality Date . PACEMAKER PLACEMENT  05/15/2014  MDT Advisa MRI implanted at [facility] for sick sinus syndrome HPI: Alayssa Flinchum is a 84 y.o. female with medical history significant for CVA (2017 and 2019), dysphagia, sick sinus s/p pacemaker placement, RAS s/p stenting, HTN, DM, CKD, home O2 (2L), who presents w/ above. Per notes pt was on a group outing from her ALF and acutely developed substernal chest pain/pressure. Found to have elevated troponin and MD diagnosed pt likely had demand ischemia in the setting of hypertension and diastolic heart failure with  possible exacerbation by lack of oxygen (patient reported yesterday that her home oxygen had run out). CXR Persistent CHF with increased RIGHT pleural effusion and bibasilar atelectasis. MBS 05/08/18 with mild oropharyngeal dysphagia and suspect esophageal dysphagia; penetration to vocal cords suspected after the swallow. Barium tablet became lodged in vallecula and was cleared with follow-up puree bolus. Esophageal sweep revealed stasis of barium material to the mid-upper level of the esophagus  No data  recorded Assessment / Plan / Recommendation CHL IP CLINICAL IMPRESSIONS 08/05/2018 Clinical Impression Patient presents with what this SLP suspects is a primary esophageal dysphagia. Oropharyngeal phase largely normal with only intermittent flash penetration of thin liquids (normal/age appropriate). Patient with full airway protection. Esophageal sweep did note relatively significant statis, often to the level of the lower cervical/upper thoracic esophagus, placing patient at risk for post prandial aspiration. Discussed general reflux strategies with patient to mitigate risk. Will f/u briefly for reinforcement of strategies and education as needed.  SLP Visit Diagnosis Dysphagia, unspecified (R13.10) Attention and concentration deficit following -- Frontal lobe and executive function deficit following -- Impact on safety and function Moderate aspiration risk;Mild aspiration risk   CHL IP TREATMENT RECOMMENDATION 08/05/2018 Treatment Recommendations Therapy as outlined in treatment plan below   Prognosis 05/08/2018 Prognosis for Safe Diet Advancement Fair Barriers to Reach Goals (No Data) Barriers/Prognosis Comment -- CHL IP DIET RECOMMENDATION 08/05/2018 SLP Diet Recommendations Regular solids;Thin liquid Liquid Administration via Cup;Straw Medication Administration Whole meds with liquid Compensations Slow rate;Small sips/bites;Follow solids with liquid Postural Changes Remain semi-upright after after feeds/meals (Comment);Seated upright at 90 degrees   CHL IP OTHER RECOMMENDATIONS 08/05/2018 Recommended Consults -- Oral Care Recommendations Oral care BID Other Recommendations --   CHL IP FOLLOW UP RECOMMENDATIONS 08/05/2018 Follow up Recommendations None   CHL IP FREQUENCY AND DURATION 08/05/2018 Speech Therapy Frequency (ACUTE ONLY) min 1 x/week Treatment Duration 1 week      CHL IP ORAL PHASE 08/05/2018 Oral Phase WFL Oral - Pudding Teaspoon -- Oral - Pudding Cup -- Oral - Honey Teaspoon -- Oral - Honey Cup -- Oral -  Nectar Teaspoon -- Oral - Nectar Cup -- Oral - Nectar Straw -- Oral - Thin Teaspoon -- Oral - Thin Cup -- Oral - Thin Straw -- Oral - Puree -- Oral - Mech Soft -- Oral - Regular -- Oral - Multi-Consistency -- Oral - Pill -- Oral Phase - Comment --  CHL IP PHARYNGEAL PHASE 08/05/2018 Pharyngeal Phase WFL Pharyngeal- Pudding Teaspoon -- Pharyngeal -- Pharyngeal- Pudding Cup -- Pharyngeal -- Pharyngeal- Honey Teaspoon -- Pharyngeal -- Pharyngeal- Honey Cup -- Pharyngeal -- Pharyngeal- Nectar Teaspoon -- Pharyngeal -- Pharyngeal- Nectar Cup -- Pharyngeal -- Pharyngeal- Nectar Straw -- Pharyngeal -- Pharyngeal- Thin Teaspoon -- Pharyngeal -- Pharyngeal- Thin Cup -- Pharyngeal -- Pharyngeal- Thin Straw -- Pharyngeal -- Pharyngeal- Puree -- Pharyngeal -- Pharyngeal- Mechanical Soft -- Pharyngeal -- Pharyngeal- Regular -- Pharyngeal -- Pharyngeal- Multi-consistency -- Pharyngeal -- Pharyngeal- Pill -- Pharyngeal -- Pharyngeal Comment --  CHL IP CERVICAL ESOPHAGEAL PHASE 08/05/2018 Cervical Esophageal Phase WFL Pudding Teaspoon -- Pudding Cup -- Honey Teaspoon -- Honey Cup -- Nectar Teaspoon -- Nectar Cup -- Nectar Straw -- Thin Teaspoon -- Thin Cup -- Thin Straw -- Puree -- Mechanical Soft -- Regular -- Multi-consistency -- Pill -- Cervical Esophageal Comment -- Ferdinand Lango MA, CCC-SLP McCoy Leah Meryl 08/05/2018, 11:29 AM               Micro Results   No results found for this or  any previous visit (from the past 240 hour(s)).   Today   Subjective    Marchell Maddux today has no sob at rest, dyspnea on exertion is improved, no orthopnea no dizziness no chest pain, patient is very eager to go back to ALF          Patient has been seen and examined prior to discharge   Objective   Blood pressure (!) 144/67, pulse 68, temperature 98 F (36.7 C), temperature source Oral, resp. rate 19, height 5\' 5"  (1.651 m), weight 73.7 kg, SpO2 95 %.   Intake/Output Summary (Last 24 hours) at 08/07/2018 1325 Last data filed  at 08/07/2018 0315 Gross per 24 hour  Intake 120 ml  Output 1150 ml  Net -1030 ml    Exam Patient is examined daily including today on 08/07/18 , exams remain the same as of yesterday except that has changed   Gen:- Awake Alert, resting comfortably HEENT:- [place].AT, No sclera icterus Nose- 2 L of oxygen via nasal cannula Neck-Supple Neck, no adenopathy.  Lungs-improving air movement, no wheezing faint scattered bibasilar Rales CV- S1, S2 normal, regular, pacemaker in situ Abd-  +ve B.Sounds, Abd Soft, No tenderness,    Extremity/Skin:- No significant edema,   +ve pulses Psych-affect is appropriate, oriented x3 Neuro-no new focal deficits, no tremors   Data Review   CBC w Diff:  Lab Results  Component Value Date   WBC 8.6 08/03/2018   HGB 12.0 08/03/2018   HCT 37.3 08/03/2018   PLT 167 08/03/2018   LYMPHOPCT 9 08/01/2018   MONOPCT 7 08/01/2018   EOSPCT 0 08/01/2018   BASOPCT 0 08/01/2018    CMP:  Lab Results  Component Value Date   NA 139 08/07/2018   K 3.5 08/07/2018   CL 95 (L) 08/07/2018   CO2 34 (H) 08/07/2018   BUN 16 08/07/2018   CREATININE 0.89 08/07/2018   PROT 6.0 (L) 08/01/2018   ALBUMIN 3.3 (L) 08/01/2018   BILITOT 0.8 08/01/2018   ALKPHOS 88 08/01/2018   AST 25 08/01/2018   ALT 27 08/01/2018  .   Total Discharge time is about 33 minutes  Shon Hale M.D on 08/07/2018 at 1:25 PM  Pager---[phone]  Go to [email or web address] - password [email or web address] for contact info  [facility] - Office  [phone]

## 2018-08-07 NOTE — Progress Notes (Signed)
Report called to facility

## 2018-08-07 NOTE — Care Management Important Message (Signed)
Important Message  Patient Details  Name: Charlene Greene MRN: [id number] Date of Birth: 1922/09/15   Medicare Important Message Given:  Yes    Braeden Kennan P Xxavier Noon 08/07/2018, 4:16 PM

## 2018-08-07 NOTE — Care Management Note (Addendum)
Case Management Note Donn Pierini RN, BSN Unit 4E- RN Care Coordinator  [phone]  Patient Details  Name: Chidera Dearcos MRN: [id number] Date of Birth: 08-Jun-1922  Subjective/Objective:  Pt admitted with NSTEMI, CHF, HTN                  Action/Plan: PTA Pt lived at [facility], has home 02 at baseline 2L, has w/c and walker.  per PT eval recommendations for [facility] services- orders have been placed for HHRN/PT- CSW following for return to ALF- for [facility] for [facility] services needs. Referral called to Tiffany with [facility] for HHRN/PT- CSW following for return to ALF transition of care needs.   Expected Discharge Date:  08/07/18               Expected Discharge Plan:  Assisted Living / Rest Home  In-House Referral:  Clinical Social Work  Discharge planning Services  CM Consult  Post Acute Care Choice:  Home Health Choice offered to:     DME Arranged:    DME Agency:     HH Arranged:  RN, Disease Management, PT HH Agency:  [facility]  Status of Service:  Completed, signed off  If discussed at [employer] of Stay Meetings, dates discussed:    Discharge Disposition: home/home health   Additional Comments:  Darrold Span, RN 08/07/2018, 2:31 PM

## 2018-08-07 NOTE — Progress Notes (Signed)
Patient will Discharge To: [facility] Anticipated DC Date:08/07/18 Family Notified:yes, son Ardeen Fillers [phone] Transport By:EMS   Per MD patient ready for DC to [facility] . RN, patient, patient's family, and facility notified of DC. Assessment, Fl2/Pasrr, and Discharge Summary sent to facility. RN given number for report [phone] ask for Wellness, room # 264). DC packet on chart. Ambulance transport requested for patient.   CSW signing off.  Budd Palmer LCSWA [phone]

## 2018-08-07 NOTE — Progress Notes (Addendum)
Progress Note  Patient Name: Charlene Greene Date of Encounter: 08/07/2018  Primary Cardiologist: Willeen Cass [facility])  Subjective   No significant overnight events. Patient looks better today. She denies any chest pain, shortness of breath, palpitations, lightheadedness or dizziness. Patient is very eager to go home.  Inpatient Medications    Scheduled Meds: . amLODipine  5 mg Oral BID  . aspirin  81 mg Oral Daily  . clopidogrel  75 mg Oral Daily  . enoxaparin (LOVENOX) injection  40 mg Subcutaneous Q24H  . furosemide  40 mg Intravenous BID  . insulin aspart  0-15 Units Subcutaneous TID WC  . insulin aspart  0-5 Units Subcutaneous QHS  . isosorbide mononitrate  30 mg Oral Daily  . lisinopril  20 mg Oral BID  . mouth rinse  15 mL Mouth Rinse BID  . metoprolol succinate  100 mg Oral Daily  . ranolazine  500 mg Oral BID   Continuous Infusions:  PRN Meds: hydrALAZINE, ipratropium-albuterol, senna   Vital Signs    Vitals:   08/06/18 1657 08/06/18 1925 08/06/18 2348 08/07/18 0541  BP: (!) 141/53 (!) 157/69 (!) 144/71 (!) 144/67  Pulse: 73 68 (!) 50 68  Resp: 17 15 (!) 25 19  Temp: 97.8 F (36.6 C) 98.1 F (36.7 C) 98 F (36.7 C) 98 F (36.7 C)  TempSrc: Oral Oral Oral Oral  SpO2: 96% 94% 94% 95%  Weight:    73.7 kg  Height:        Intake/Output Summary (Last 24 hours) at 08/07/2018 1016 Last data filed at 08/07/2018 0315 Gross per 24 hour  Intake 280 ml  Output 1300 ml  Net -1020 ml   Filed Weights   08/05/18 0454 08/06/18 0248 08/07/18 0541  Weight: 75.8 kg 75.1 kg 73.7 kg    Telemetry    Sinus rhythm with atrial pacing, rate between 60s to 80s, and multiple PVCs - Personally Reviewed  Physical Exam   GEN: Elderly overweight female sitting comfortably in chair in no acute distress. Currently on 2L of supplemental O2 via nasal cannula. Neck: Supple. Cardiac: RRR. No murmurs, rubs, or gallops.  Respiratory: No increased work of breathing. Normal respiratory  rate. Diminished breath sounds with crackles in bilateral bases but continues to improve.  GI: Abdomen soft, non-tender, non-distended  MS: No lower extremity edema. Neuro:  No focal deficits. Psych: Normal affect.   Labs    Chemistry Recent Labs  Lab 08/01/18 1817  08/05/18 0416 08/06/18 0344 08/07/18 0326  NA 138   < > 137 137 139  K 4.1   < > 4.0 3.7 3.5  CL 98   < > 96* 95* 95*  CO2 28   < > 30 31 34*  GLUCOSE 284*   < > 170* 187* 135*  BUN 16   < > 19 19 16   CREATININE 0.82   < > 0.90 0.95 0.89  CALCIUM 8.7*   < > 8.4* 8.2* 8.3*  PROT 6.0*  --   --   --   --   ALBUMIN 3.3*  --   --   --   --   AST 25  --   --   --   --   ALT 27  --   --   --   --   ALKPHOS 88  --   --   --   --   BILITOT 0.8  --   --   --   --  GFRNONAA 60*   < > 53* 50* 54*  GFRAA >60   < > >60 58* >60  ANIONGAP 12   < > 11 11 10    < > = values in this interval not displayed.     Hematology Recent Labs  Lab 08/01/18 1817 08/02/18 0315 08/03/18 0445  WBC 9.1 10.3 8.6  RBC 4.69 4.57 4.16  HGB 13.7 13.3 12.0  HCT 42.9 41.4 37.3  MCV 91.5 90.6 89.7  MCH 29.2 29.1 28.8  MCHC 31.9 32.1 32.2  RDW 15.1 14.8 14.6  PLT 198 195 167    Cardiac Enzymes Recent Labs  Lab 08/02/18 0315 08/02/18 0848 08/02/18 1351  TROPONINI 0.23* 0.19* 0.18*    Recent Labs  Lab 08/01/18 1821 08/01/18 2108  TROPIPOC 0.04 0.20*     BNP Recent Labs  Lab 08/02/18 0000  BNP 900.5*     DDimer No results for input(s): DDIMER in the last 168 hours.   Radiology    Dg Swallowing Func-speech Pathology  Result Date: 08/05/2018 Objective Swallowing Evaluation: Type of Study: MBS-Modified Barium Swallow Study  Patient Details Name: Charlene Greene MRN: [id number] Date of Birth: 03-24-1922 Today's Date: 08/05/2018 Time: SLP Start Time (ACUTE ONLY): 1034 -SLP Stop Time (ACUTE ONLY): 1050 SLP Time Calculation (min) (ACUTE ONLY): 16 min Past Medical History: Past Medical History: Diagnosis Date . CAD (coronary artery  disease)  . Diabetes mellitus without complication (HCC)  . Hyperlipidemia  . Hypertension  . Pacemaker  . Renal artery stenosis (HCC)   s/p bilateral stents . Sick sinus syndrome (HCC)  . Stroke [facility])   2 previous cva's Past Surgical History: Past Surgical History: Procedure Laterality Date . PACEMAKER PLACEMENT  05/15/2014  MDT Advisa MRI implanted at [facility] for sick sinus syndrome HPI: Eilyn Polack is a 84 y.o. female with medical history significant for CVA (2017 and 2019), dysphagia, sick sinus s/p pacemaker placement, RAS s/p stenting, HTN, DM, CKD, home O2 (2L), who presents w/ above. Per notes pt was on a group outing from her ALF and acutely developed substernal chest pain/pressure. Found to have elevated troponin and MD diagnosed pt likely had demand ischemia in the setting of hypertension and diastolic heart failure with possible exacerbation by lack of oxygen (patient reported yesterday that her home oxygen had run out). CXR Persistent CHF with increased RIGHT pleural effusion and bibasilar atelectasis. MBS 05/08/18 with mild oropharyngeal dysphagia and suspect esophageal dysphagia; penetration to vocal cords suspected after the swallow. Barium tablet became lodged in vallecula and was cleared with follow-up puree bolus. Esophageal sweep revealed stasis of barium material to the mid-upper level of the esophagus  No data recorded Assessment / Plan / Recommendation CHL IP CLINICAL IMPRESSIONS 08/05/2018 Clinical Impression Patient presents with what this SLP suspects is a primary esophageal dysphagia. Oropharyngeal phase largely normal with only intermittent flash penetration of thin liquids (normal/age appropriate). Patient with full airway protection. Esophageal sweep did note relatively significant statis, often to the level of the lower cervical/upper thoracic esophagus, placing patient at risk for post prandial aspiration. Discussed general reflux strategies with patient to mitigate risk. Will f/u  briefly for reinforcement of strategies and education as needed.  SLP Visit Diagnosis Dysphagia, unspecified (R13.10) Attention and concentration deficit following -- Frontal lobe and executive function deficit following -- Impact on safety and function Moderate aspiration risk;Mild aspiration risk   CHL IP TREATMENT RECOMMENDATION 08/05/2018 Treatment Recommendations Therapy as outlined in treatment plan below   Prognosis 05/08/2018 Prognosis for Safe Diet  Advancement Fair Barriers to Reach Goals (No Data) Barriers/Prognosis Comment -- CHL IP DIET RECOMMENDATION 08/05/2018 SLP Diet Recommendations Regular solids;Thin liquid Liquid Administration via Cup;Straw Medication Administration Whole meds with liquid Compensations Slow rate;Small sips/bites;Follow solids with liquid Postural Changes Remain semi-upright after after feeds/meals (Comment);Seated upright at 90 degrees   CHL IP OTHER RECOMMENDATIONS 08/05/2018 Recommended Consults -- Oral Care Recommendations Oral care BID Other Recommendations --   CHL IP FOLLOW UP RECOMMENDATIONS 08/05/2018 Follow up Recommendations None   CHL IP FREQUENCY AND DURATION 08/05/2018 Speech Therapy Frequency (ACUTE ONLY) min 1 x/week Treatment Duration 1 week      CHL IP ORAL PHASE 08/05/2018 Oral Phase WFL Oral - Pudding Teaspoon -- Oral - Pudding Cup -- Oral - Honey Teaspoon -- Oral - Honey Cup -- Oral - Nectar Teaspoon -- Oral - Nectar Cup -- Oral - Nectar Straw -- Oral - Thin Teaspoon -- Oral - Thin Cup -- Oral - Thin Straw -- Oral - Puree -- Oral - Mech Soft -- Oral - Regular -- Oral - Multi-Consistency -- Oral - Pill -- Oral Phase - Comment --  CHL IP PHARYNGEAL PHASE 08/05/2018 Pharyngeal Phase WFL Pharyngeal- Pudding Teaspoon -- Pharyngeal -- Pharyngeal- Pudding Cup -- Pharyngeal -- Pharyngeal- Honey Teaspoon -- Pharyngeal -- Pharyngeal- Honey Cup -- Pharyngeal -- Pharyngeal- Nectar Teaspoon -- Pharyngeal -- Pharyngeal- Nectar Cup -- Pharyngeal -- Pharyngeal- Nectar Straw --  Pharyngeal -- Pharyngeal- Thin Teaspoon -- Pharyngeal -- Pharyngeal- Thin Cup -- Pharyngeal -- Pharyngeal- Thin Straw -- Pharyngeal -- Pharyngeal- Puree -- Pharyngeal -- Pharyngeal- Mechanical Soft -- Pharyngeal -- Pharyngeal- Regular -- Pharyngeal -- Pharyngeal- Multi-consistency -- Pharyngeal -- Pharyngeal- Pill -- Pharyngeal -- Pharyngeal Comment --  CHL IP CERVICAL ESOPHAGEAL PHASE 08/05/2018 Cervical Esophageal Phase WFL Pudding Teaspoon -- Pudding Cup -- Honey Teaspoon -- Honey Cup -- Nectar Teaspoon -- Nectar Cup -- Nectar Straw -- Thin Teaspoon -- Thin Cup -- Thin Straw -- Puree -- Mechanical Soft -- Regular -- Multi-consistency -- Pill -- Cervical Esophageal Comment -- Ferdinand Lango MA, CCC-SLP McCoy Leah Meryl 08/05/2018, 11:29 AM               Cardiac Studies   ECHO 02/2018:Severe asymmetric LVH. Vigorous LV function with a EF of 65 to 70%. GR 1 DD with high filling pressures. Mild aortic calcification with no stenosis (sclerosis). Calcified mitral annulus without stenosis.  ECHO 2017:Ejection Fraction = >55%.The left ventricular wall motion is normal.There is mild mitral regurgitation.  NUCLEAR STRESS TEST 2015: Reportedly with mild inferolateral ischemia  Patient Profile     Janete Cliftonis a 89 year oldfemalewith a history ofCAD,chronic diastolic heart failure,HTN, HLD,sick sinus syndrome s/p PPM (MDT), prior CVAs, DM,who is being seen today for the evaluation ofelevated troponinat the request of Dr. Silverio Lay.  Assessment & Plan    . Elevated Troponin with History of CAD -Patient has a history of a small NSTEMI but no record of cardiac catheterization  -Patient presentedon 9/20/2019with nonspecific episode of chest pain that was relieved with NTG. Troponin elevated x3 on 08/02/2018 but downtrending (0.23 >>0.19 >>0.18).Most likelydemand ischemiain the setting of hypertension and diastolic heart failure with possible exacerbation by lack of oxygen (patient reported  yesterday that her home oxygen takes had run out). - Nuclear Stress Test [phone] showed mild inferolateral ischemia. - Echo as below. - Patient is currently on 2L of supplemental O2 via nasal cannula, which is her baseline. She denies any angina or dyspnea at this time. - Continue Aspirin 81mg andPlavix 75mg  daily.  -  Continue Toprol XL 100mg  daily.  - Continue Ranexa 500mg  twice daily. - Patient reportedly intolerant to statins.Defer to primary outpatient cardiologist.  2.AcceleratedHypertension - BPcontinues to be slightly elevated but seems to be improving. Most recent BP 144/67. - Continue Amlodipine 5mg  twice daily. - Continue Lisinopril 20mg  twice daily.  - Continue Imdur 30mg  daily.  - Continue Toprol-XLas above.  3. Chronic Diastolic Heart Failure - Echo 4/40/1027 showed severe asymmetric hypertrophy of LV with EF of 65-70% and grade 1 diastolic dysfucntion; no wall motion abnormalities noted. - Chest x-rayon 08/04/2018 showedpulmonary vascular congestion with bilateral infiltrates (likely pulmonary edema). Bibasilar atelectasis. - Patient still has some crackles in bilateral bases but continues to improve. Patient is down to 2L of supplemental oxygen via nasal cannula which is her baseline.   - Continue diuresis. Patient received IV Lasix 40mg  this morning around 8am. Will hold afternoon IV dose and transition to PO 80mg  tomorrow morning.  - SCr stable at 0.89. Will continue to monitor renal function. - Continue Toprol XL and Amlodipine as above. - Continue Imdur 30mg  daily.  - Continue to monitor daily weights and strict I's and O's.  4. Sick Sinus Syndrome s/p PPM - Stable. - Continue monitoring with telemetry.  5. PVCs - Telemetry revealsmultiplePVCs.  - Patient denies any palpitations.  - Continue Toprol-XL as above.  6. Diabetes Mellitus  - Hgb A1c 6.9 upon admission. - Per IM.      Signed, Corrin Parker, PA-C  08/07/2018, 10:16 AM    I  have seen, examined and evaluated the patient this AM along with Marjie Skiff, PA-C.  After reviewing all the available data and chart, we discussed the patients laboratory, study & physical findings as well as symptoms in detail. I agree with her findings, examination as well as impression recommendations as per our discussion.    Attending adjustments noted in italics.   Ms. Pauli looks much better today than yesterday.  Still has some mildly diminished breath sounds in the bases but much better than before.  We should be okay with oral Lasix.  I think she is nearing being stable for discharge -> either this afternoon or tomorrow.  Her blood pressure remains low but high, but at her age would prefer not to be overly aggressive  Is on stable cardiology medication regimen.  [facility] will sign off as of tomorrow AM if stable (will review tomorrow, but no note if plan is discharge).   Medication Recommendations:  Convert to PO Lasix 80 mg in AM -- otherwise no new changes from [facility] Other recommendations (labs, testing, etc):  No Additional Testing  Follow up as an outpatient:  She has indicated that she would like to move to a Cardiologist nearer to her family - [place] In [place] (she does have a family member who is currently seeing a [facility] cardiologist, but she is not sure the name.) -->  Will arrange outpatient follow-up with [facility], at which time we can arrange follow-up with the cardiologist of choice.    Bryan Lemma, M.D., M.S. Interventional Cardiologist   Pager # [phone] Phone # [phone] [address] [place], [place] [address]   For questions or updates, please contact [facility] Please consult [email or web address] for contact info under

## 2018-08-07 NOTE — Plan of Care (Signed)
  Problem: Education: Goal: Knowledge of General Education information will improve Description: Including pain rating scale, medication(s)/side effects and non-pharmacologic comfort measures Outcome: Progressing   Problem: Health Behavior/Discharge Planning: Goal: Ability to manage health-related needs will improve Outcome: Progressing   Problem: Clinical Measurements: Goal: Will remain free from infection Outcome: Progressing Goal: Cardiovascular complication will be avoided Outcome: Progressing   

## 2018-08-07 NOTE — Discharge Instructions (Signed)
1)Very low-salt diet advised 2)Weigh yourself daily, call if you gain more than 3 pounds in 1 day or more than 5 pounds in 1 week as your diuretic medications may need to be adjusted 3)Limit your Fluid  intake to no more than 60 ounces (1.8 Liters) per day 4) continue supplemental oxygen via nasal cannula at 2 L/min as ordered

## 2018-08-07 NOTE — Telephone Encounter (Signed)
TOC, patient currently admitted.

## 2018-08-07 NOTE — Telephone Encounter (Signed)
New Message   Patient has a TOC appt scheduled for 08/18/18 with Joni Reining.

## 2018-08-08 NOTE — Telephone Encounter (Signed)
Patient contacted regarding discharge from [facility] on 08/07/18  Patient understands to follow up with provider Joni Reining, NP on 08/18/18 at 9:30am at [facility]. Patient understands discharge instructions? Yes Patient understands medications and regiment? Yes Patient understands to bring all medications to this visit? Yes  Patient gave verbal to contact son and let him know where office was located, I called gave address and where our office was at. Gave call back number if questions.

## 2018-08-17 NOTE — Progress Notes (Signed)
Cardiology Office Note   Date:  08/18/2018   ID:  Charlene Greene, DOB [birth year], MRN [id number]  PCP:  [facility]  Cardiologist:  Dr. Willeen Cass [facility]) Chief Complaint  Patient presents with  . Hospitalization Follow-up  . Hypertension     History of Present Illness: Charlene Greene is a 84 y.o. female who presents for posthospitalization follow-up, after admission for non-ST elevation MI, with known history of coronary artery disease, chronic diastolic heart failure, hypertension, sick sinus syndrome status post Medtronic dual-chamber pacemaker, hyperlipidemia, with other history to include prior CVAs, and diabetes.  Elevated troponin was thought likely to be related to demand ischemia in the setting of hypertension diastolic heart failure and hypoxia as the patient wears oxygen at home which had run out.  On discharge the patient was placed back on 2 L of supplemental oxygen via nasal cannula she was to continue aspirin 81 mg daily Plavix 75 mg daily metoprolol 100 mg daily and Ranexa 500 mg twice daily.  The patient is intolerant of statins.  She was continued on her antihypertensive medications to include amlodipine lisinopril and Imdur.  Echocardiogram revealed normal LV systolic function with grade 1 diastolic dysfunction.  After diuresis the patient was placed on p.o. Lasix 80 mg daily.  She was placed on a 1.8 L fluid restriction and low-sodium diet.  She comes today and is incontinent of stool and urine. She is accompanied by her daughter. It took several minutes to clean her up and get her ready to be seen. She has been noted to have elevated BP at [facility]. She is now at [facility]. She has moved to [facility] and wants to be established in our practice. She saw Dr. Herbie Baltimore and Dr. Johney Frame in the hospital.     Past Medical History:  Diagnosis Date  . CAD (coronary artery disease)   . Diabetes mellitus without complication (HCC)   . Hyperlipidemia   .  Hypertension   . Pacemaker   . Renal artery stenosis (HCC)    s/p bilateral stents  . Sick sinus syndrome (HCC)   . Stroke [facility])    2 previous cva's    Past Surgical History:  Procedure Laterality Date  . PACEMAKER PLACEMENT  05/15/2014   MDT Advisa MRI implanted at [facility] for sick sinus syndrome     Current Outpatient Medications  Medication Sig Dispense Refill  . acetaminophen (TYLENOL) 500 MG tablet Take 500 mg by mouth See admin instructions. Take 500 mg by mouth two times a day and an additional 500 mg every 12 hours as needed for pain or fever- NOT TO EXCEED 2 "AS NEEDED" TABLETS IN 24 HOURS    . amLODipine (NORVASC) 5 MG tablet Take 1 tablet (5 mg total) by mouth 2 (two) times daily. 60 tablet 0  . aspirin 81 MG chewable tablet Chew 1 tablet (81 mg total) by mouth daily. With Food 30 tablet 2  . Cholecalciferol (VITAMIN D-3) 1000 units CAPS Take 1,000 Units by mouth daily.    . clopidogrel (PLAVIX) 75 MG tablet Take 1 tablet (75 mg total) by mouth daily. 90 tablet 0  . dextromethorphan-guaiFENesin (MUCINEX DM) 30-600 MG 12hr tablet Take 1 tablet by mouth 2 (two) times daily.    . feeding supplement, ENSURE ENLIVE, (ENSURE ENLIVE) LIQD Take 237 mLs by mouth 2 (two) times daily between meals. 237 mL 12  . furosemide (LASIX) 80 MG tablet Take 1 tablet (80 mg total) by mouth daily. 30 tablet 3  .  glimepiride (AMARYL) 1 MG tablet Take 1 tablet (1 mg total) by mouth daily with breakfast. 30 tablet 0  . Ipratropium-Albuterol (COMBIVENT) 20-100 MCG/ACT AERS respimat Inhale 2 puffs into the lungs 2 (two) times daily. For 2 weeks    . isosorbide mononitrate (IMDUR) 30 MG 24 hr tablet Take 30 mg by mouth daily.    . isosorbide mononitrate (IMDUR) 60 MG 24 hr tablet Take 1 tablet (60 mg total) by mouth daily. (Patient not taking: Reported on 08/01/2018) 30 tablet 1  . lisinopril (PRINIVIL,ZESTRIL) 20 MG tablet Take 1 tablet (20 mg total) by mouth 2 (two) times daily. 60 tablet 0  . Magnesium  250 MG TABS Take 250 mg by mouth at bedtime.    . metoprolol succinate (TOPROL-XL) 100 MG 24 hr tablet Take 1 tablet (100 mg total) by mouth daily. 30 tablet 1  . Multiple Vitamin (MULTIVITAMIN WITH MINERALS) TABS tablet Take 1 tablet by mouth daily.    . nitroGLYCERIN (NITROSTAT) 0.4 MG SL tablet Place 0.4 mg under the tongue every 5 (five) minutes x 3 doses as needed for chest pain (and call 9-1-1 and MD, if no relief).     . ranolazine (RANEXA) 500 MG 12 hr tablet Take 1 tablet (500 mg total) by mouth 2 (two) times daily. 60 tablet 1  . senna (SENOKOT) 8.6 MG TABS tablet Take 2 tablets (17.2 mg total) by mouth at bedtime. 120 each 0   No current facility-administered medications for this visit.     Allergies:   Statins; Other; Amlodipine; Celecoxib; Codeine; Naproxen; and Piroxicam    Social History:  The patient  reports that she has never smoked. She has never used smokeless tobacco. She reports that she does not drink alcohol or use drugs.   Family History:  The patient's family history includes Hypertension in her other; Lung cancer in her sister.    ROS: All other systems are reviewed and negative. Unless otherwise mentioned in H&P    PHYSICAL EXAM: VS:  BP (!) 171/71   Pulse 62   Ht 5' 5.5" (1.664 m)   Wt 160 lb (72.6 kg)   BMI 26.22 kg/m  , BMI Body mass index is 26.22 kg/m. GEN: Well nourished, well developed, in no acute distress HEENT: normal Neck: no JVD, carotid bruits, or masses Cardiac: RRR; occasional extra systole no, rubs, or gallops,no edema  Respiratory:  Clear to auscultation bilaterally except for diminished breath sounds in the bases, normal work of breathing, wearing O2 via [place].  GI: soft, nontender, nondistended, + BS MS: no deformity or atrophy Skin: warm and dry, no rash Neuro:  Strength and sensation are intact Psych: euthymic mood, full affect   EKG:  Not competed the office visit.   Recent Labs: 08/01/2018: ALT 27 08/02/2018: B Natriuretic  Peptide 900.5 08/03/2018: Hemoglobin 12.0; Platelets 167 08/07/2018: BUN 16; Creatinine, Ser 0.89; Potassium 3.5; Sodium 139    Lipid Panel    Component Value Date/Time   CHOL 268 (H) 08/02/2018 0315   TRIG 151 (H) 08/02/2018 0315   HDL 48 08/02/2018 0315   CHOLHDL 5.6 08/02/2018 0315   VLDL 30 08/02/2018 0315   LDLCALC 190 (H) 08/02/2018 0315      Wt Readings from Last 3 Encounters:  08/18/18 160 lb (72.6 kg)  08/07/18 162 lb 7.7 oz (73.7 kg)  05/14/18 156 lb 9.6 oz (71 kg)      Other studies Reviewed: Echocardiogram 03/19/18  Left ventricle: The cavity size was normal. There  was severe   asymmetric hypertrophy. Systolic function was vigorous. The   estimated ejection fraction was in the range of 65% to 70%. Wall   motion was normal; there were no regional wall motion   abnormalities. There was an increased relative contribution of   atrial contraction to ventricular filling. Doppler parameters are   consistent with abnormal left ventricular relaxation (grade 1   diastolic dysfunction). Doppler parameters are consistent with   high ventricular filling pressure. - Aortic valve: Trileaflet; mildly thickened, mildly calcified   leaflets. - Mitral valve: Calcified annulus. Valve area by pressure   half-time: 1.68 cm^2. Valve area by continuity equation (using   LVOT flow): 1.88 cm^2. - Atrial septum: There was increased thickness of the septum,   consistent with lipomatous hypertrophy. - Tricuspid valve: There was trivial regurgitation.  ABI 03/11/2018  Final Interpretation: Right: Resting right ankle-brachial index indicates severe right lower extremity arterial disease. Left: Resting left ankle-brachial index indicates critical left limb ischemia.  ASSESSMENT AND PLAN:  1.Hypertension: Not sure what dose she is taking now. Daughter, who is a Engineer, civil (consulting), did not bring her medication list with her. Most recent dose is amlodipine 5 mg daily. I will increase to 10 mg daily.  She may need to increase Imdur to 90 mg if BP remains elevated. She had been followed by another cardiologist in the past with multiple medication adjustments. Daughter in law wants her to be put back on clonidine. I will wait to do this, after titration of current medications first.   2. Chronic Diastolic CHF: No evidence of volume overload today. She is incontinent of urine and stool. I will have U/A checked to evaluate for UTI.  Continue lasix 80 mg as directed. I will check a BMET in one week.   3. PPM in situ: She was seen by Dr. Johney Frame during recent admission. Will make appointment to be seen in [facility] clinic for follow up.   4. Hx of CVA: Left sided weakness. She is wheelchair bound,   5. Chronic Respiratory Failure: Continue O2 via [place].    Current medicines are reviewed at length with the patient today.    Labs/ tests ordered today include: BMET and UA  Bettey Mare. Liborio Nixon, ANP, AACC   08/18/2018 9:44 AM    [facility] [address] Office [phone] Fax [phone]

## 2018-08-18 ENCOUNTER — Ambulatory Visit (INDEPENDENT_AMBULATORY_CARE_PROVIDER_SITE_OTHER): Payer: Medicare Other | Admitting: Adult Health

## 2018-08-18 ENCOUNTER — Encounter: Payer: Self-pay | Admitting: Adult Health

## 2018-08-18 VITALS — BP 171/71 | HR 62 | Ht 65.5 in | Wt 160.0 lb

## 2018-08-18 DIAGNOSIS — I5022 Chronic systolic (congestive) heart failure: Secondary | ICD-10-CM | POA: Diagnosis not present

## 2018-08-18 DIAGNOSIS — Z79899 Other long term (current) drug therapy: Secondary | ICD-10-CM

## 2018-08-18 DIAGNOSIS — I639 Cerebral infarction, unspecified: Secondary | ICD-10-CM | POA: Diagnosis not present

## 2018-08-18 DIAGNOSIS — Z950 Presence of cardiac pacemaker: Secondary | ICD-10-CM

## 2018-08-18 DIAGNOSIS — Z9981 Dependence on supplemental oxygen: Secondary | ICD-10-CM

## 2018-08-18 MED ORDER — AMLODIPINE BESYLATE 10 MG PO TABS: 10.0000 mg | ORAL_TABLET | Freq: Two times a day (BID) | ORAL | 0 refills | Status: DC

## 2018-08-18 MED ORDER — AMLODIPINE BESYLATE 10 MG PO TABS: 10.0000 mg | ORAL_TABLET | Freq: Two times a day (BID) | ORAL | 0 refills | Status: AC

## 2018-08-18 NOTE — Patient Instructions (Signed)
Medication Instructions:  INCREASE AMLODIPINE 10MG  IN THE AM AND 5MG  IN THE PM  If you need a refill on your cardiac medications before your next appointment, please call your pharmacy.  Labwork: BMET IN ONE WEEK (08-25-2018) HERE IN OUR OFFICE AT [facility]  Take the provided lab slips with you to the lab for your blood draw.   You will NOT need to fast   If you have labs (blood work) drawn today and your tests are completely normal, you will receive your results only by: Marland Kitchen MyChart Message (if you have MyChart) OR . A paper copy in the mail If you have any lab test that is abnormal or we need to change your treatment, we will call you to review the results.  Special Instructions: REFER TO [facility]  Follow-Up: At [facility], you and your health needs are our priority.  As part of our continuing mission to provide you with exceptional heart care, we have created designated Provider Care Teams.  These Care Teams include your primary Cardiologist (physician) and Advanced Practice Providers (APPs -  Physician Assistants and Nurse Practitioners) who all work together to provide you with the care you need, when you need it. . You will need a follow up appointment in 3 MONTHS.  Please call our office 2 months in advance to schedule this appointment.  You may see DR Herbie Baltimore or one of the following Advanced Practice Providers on your designated Care Team:   . Theodore Demark, PA-C . Joni Reining, DNP, ANP  Thank you for choosing [facility] at [facility]!!

## 2018-09-01 ENCOUNTER — Encounter: Payer: Medicare Other | Admitting: Internal Medicine

## 2018-09-02 ENCOUNTER — Encounter: Payer: Self-pay | Admitting: Internal Medicine

## 2018-09-02 ENCOUNTER — Encounter: Payer: Self-pay | Admitting: Cardiology

## 2018-09-04 ENCOUNTER — Encounter (INDEPENDENT_AMBULATORY_CARE_PROVIDER_SITE_OTHER): Payer: Self-pay | Admitting: Ophthalmology

## 2018-09-10 ENCOUNTER — Other Ambulatory Visit: Payer: Self-pay

## 2018-09-10 ENCOUNTER — Telehealth: Payer: Self-pay | Admitting: Adult Health

## 2018-09-10 NOTE — Progress Notes (Signed)
Not needed

## 2018-09-10 NOTE — Telephone Encounter (Signed)
D/W daughter and I am awaiting an answer from facility to see what to do next. Will cb and re check Monday or tuesday

## 2018-09-10 NOTE — Telephone Encounter (Signed)
Fax sent to facility.

## 2018-09-10 NOTE — Telephone Encounter (Signed)
I would refer her to [facility] for their recommendations and other options which may be available,closer office or less frequent visits

## 2018-09-10 NOTE — Telephone Encounter (Signed)
REFERRAL ENTERED

## 2018-09-10 NOTE — Telephone Encounter (Signed)
New message   Per patient's daughter wants to know if there are any other options for her mother to have her blood drawn since it creates a hardship to have her to come to the office? Please call to discuss.

## 2018-09-10 NOTE — Telephone Encounter (Signed)
Returned call to daughter in law-she states patient recently saw K. Lawrence DNP to get established with cardiology in [place] and Dr. Herbie Baltimore will be her primary MD.   At OV 10/7, patient had accident and had to change clothes and be wrapped in blanket before being seen.   She states this is a constant issue and she is requesting some way for her care to be combined as it is very difficult to get patient to and from appts.   She was suppose to have labs (BMET) in 1 week after OV but this has not been completed as she cannot get her here to have this done.  She is a Engineer, civil (consulting) and can only come on certain days and in the afternoon.   Patients assisted living facility does not draw labs.   appt with Dr. Johney Frame is going to be rescheduled as she could not bring patient that day.     Advised would send message to EP scheduler to arrange appt with Dr. Johney Frame.  Advised labs can be done at this office but may need to be completed sooner if appt not soon.     Also advised would route to DNP to review and advise of other options (HH for lab draws? Wait until she sees EP based on urgency).

## 2018-09-13 ENCOUNTER — Encounter (HOSPITAL_COMMUNITY): Payer: Self-pay | Admitting: Emergency Medicine

## 2018-09-13 ENCOUNTER — Emergency Department (HOSPITAL_COMMUNITY): Payer: Medicare Other

## 2018-09-13 ENCOUNTER — Other Ambulatory Visit: Payer: Self-pay

## 2018-09-13 ENCOUNTER — Emergency Department (HOSPITAL_COMMUNITY)
Admission: EM | Admit: 2018-09-13 | Discharge: 2018-09-14 | Disposition: A | Discharge status: Home / Self Care | Payer: Medicare Other | Attending: Emergency Medicine | Admitting: Emergency Medicine

## 2018-09-13 DIAGNOSIS — Y999 Unspecified external cause status: Secondary | ICD-10-CM | POA: Insufficient documentation

## 2018-09-13 DIAGNOSIS — Z7982 Long term (current) use of aspirin: Secondary | ICD-10-CM | POA: Insufficient documentation

## 2018-09-13 DIAGNOSIS — I130 Hypertensive heart and chronic kidney disease with heart failure and stage 1 through stage 4 chronic kidney disease, or unspecified chronic kidney disease: Secondary | ICD-10-CM | POA: Diagnosis not present

## 2018-09-13 DIAGNOSIS — I5032 Chronic diastolic (congestive) heart failure: Secondary | ICD-10-CM | POA: Diagnosis not present

## 2018-09-13 DIAGNOSIS — Y92009 Unspecified place in unspecified non-institutional (private) residence as the place of occurrence of the external cause: Secondary | ICD-10-CM | POA: Insufficient documentation

## 2018-09-13 DIAGNOSIS — E1122 Type 2 diabetes mellitus with diabetic chronic kidney disease: Secondary | ICD-10-CM | POA: Diagnosis not present

## 2018-09-13 DIAGNOSIS — N183 Chronic kidney disease, stage 3 (moderate): Secondary | ICD-10-CM | POA: Insufficient documentation

## 2018-09-13 DIAGNOSIS — W19XXXA Unspecified fall, initial encounter: Secondary | ICD-10-CM

## 2018-09-13 DIAGNOSIS — S0990XA Unspecified injury of head, initial encounter: Secondary | ICD-10-CM | POA: Insufficient documentation

## 2018-09-13 DIAGNOSIS — Z950 Presence of cardiac pacemaker: Secondary | ICD-10-CM | POA: Diagnosis not present

## 2018-09-13 DIAGNOSIS — Y9301 Activity, walking, marching and hiking: Secondary | ICD-10-CM | POA: Insufficient documentation

## 2018-09-13 DIAGNOSIS — E1140 Type 2 diabetes mellitus with diabetic neuropathy, unspecified: Secondary | ICD-10-CM | POA: Insufficient documentation

## 2018-09-13 DIAGNOSIS — Z79899 Other long term (current) drug therapy: Secondary | ICD-10-CM | POA: Insufficient documentation

## 2018-09-13 DIAGNOSIS — I2510 Atherosclerotic heart disease of native coronary artery without angina pectoris: Secondary | ICD-10-CM | POA: Diagnosis not present

## 2018-09-13 DIAGNOSIS — W0110XA Fall on same level from slipping, tripping and stumbling with subsequent striking against unspecified object, initial encounter: Secondary | ICD-10-CM | POA: Diagnosis not present

## 2018-09-13 NOTE — ED Triage Notes (Addendum)
Patient from [facility], she got up heading to bathroom, had mechanical fall, no neck or back pain, states that she may have hit her head.  Patient has been hypertensive.  No LOC per patient, GCS of 15 and CAOx4.  Patient is on Plavix.

## 2018-09-13 NOTE — ED Notes (Signed)
Patient taken to CT.

## 2018-09-13 NOTE — ED Provider Notes (Signed)
Medical screening examination/treatment/procedure(s) were conducted as a shared visit with non-physician practitioner(s) and myself.  I personally evaluated the patient during the encounter.  Clinical Impression:   Final diagnoses:  Fall, initial encounter    The pt fell at her NH - states she is unsure why she fell - she has no signs of trauma on exam - she has no ttp over the scalp and no hematoma - she has a normal level of alertness and normal strength in all 4 extremities.  She has peripheral neuropathy and has some dec sensation bilateral LE"s with some hyperemia of the feet - no open wounds, no warmth, no ttp.  abd soft - heart rate in the 60's - well appearing otherwise with no other c/o.  Ct head.   EKG Interpretation  Date/Time:  Sunday September 14 2018 01:38:06 EDT Ventricular Rate:  61 PR Interval:    QRS Duration: 107 QT Interval:  520 QTC Calculation: 524 R Axis:   -29 Text Interpretation:  Sinus rhythm Ventricular premature complex Borderline prolonged PR interval Borderline left axis deviation Abnormal R-wave progression, late transition Prolonged QT interval Baseline wander in lead(s) V6 paced beats not seen on this EKG Confirmed by Eber Hong ([id number]) on 09/14/2018 1:49:11 AM Also confirmed by Eber Hong ([id number]), editor Sheppard Evens [phone])  on 09/14/2018 2:29:04 PM         Eber Hong, MD 09/14/18 [id number]

## 2018-09-13 NOTE — ED Provider Notes (Signed)
[facility] EMERGENCY DEPARTMENT Provider Note   CSN: [id number] Arrival date & time: 09/13/18  2303     History   Chief Complaint Chief Complaint  Patient presents with  . Fall    HPI Anadia Heslin is a 84 y.o. female.   84 year old female with history of CAD, diastolic CHF, HTN, sick sinus syndrome s/p [employer]tronic dual-chamber pacemaker, HLD, CVAs, and DM presents to the ED following a fall.  She reports that she was in her home with her home health aide.  She was ambulating with her walker which is her baseline.  She was heading to the bathroom when she felt her legs give out.  She states that this happens on occasion and she attributes it to her prior strokes.  She reports being told that she hit her head, but she does not recall whether she did.  She does note landing on her buttock.  She has no complaints of pain at this time.  Denies any preceding lightheadedness, dizziness, chest pain, shortness of breath.  She is not currently experiencing any headache, vision changes, nausea, vomiting, new or worsening extremity numbness or paresthesias, extremity weakness.  Patient is on aspirin and Plavix.  No other anticoagulants.     Past Medical History:  Diagnosis Date  . CAD (coronary artery disease)   . Diabetes mellitus without complication (HCC)   . Hyperlipidemia   . Hypertension   . Pacemaker   . Renal artery stenosis (HCC)    s/p bilateral stents  . Sick sinus syndrome (HCC)   . Stroke [facility])    2 previous cva's    Patient Active Problem List   Diagnosis Date Noted  . Non-ST elevation (NSTEMI) myocardial infarction (HCC) 08/05/2018  . Oxygen dependent 08/02/2018  . Elevated troponin 08/02/2018  . Pressure injury of skin 05/12/2018  . HCAP (healthcare-associated pneumonia) 05/08/2018  . Acute respiratory failure with hypoxia (HCC) 05/07/2018  . Sleep disturbance   . Elevated BUN   . Labile blood pressure   . Essential hypertension   . Dysphasia,  post-stroke   . PAD (peripheral artery disease) (HCC)   . Hypoalbuminemia due to protein-calorie malnutrition (HCC)   . Diabetic peripheral neuropathy (HCC)   . Diabetes mellitus type 2 in nonobese (HCC)   . Chronic diastolic congestive heart failure (HCC)   . Left thalamic infarction (HCC) 02/26/2018  . Dysphagia, post-stroke   . Acute on chronic diastolic congestive heart failure (HCC)   . Type 2 diabetes mellitus with peripheral neuropathy (HCC)   . TIA (transient ischemic attack) 02/22/2018  . Labile blood glucose   . Hypotension due to drugs   . Hyponatremia   . Renovascular hypertension   . Hypertensive crisis   . Type 2 diabetes mellitus with diabetic autonomic neuropathy, without long-term current use of insulin (HCC)   . Stage 3 chronic kidney disease (HCC)   . Acute lower UTI   . Acute renal insufficiency 09/27/2016  . Urinary urgency 09/27/2016  . Basal ganglia infarction (HCC) 09/26/2016  . Neuropathic pain   . Benign essential HTN   . Slow transit constipation   . Elevated creatine kinase   . Accelerated hypertension 09/24/2016  . Diabetes mellitus (HCC) 09/24/2016  . Hyperlipidemia 09/24/2016  . Sick sinus syndrome (HCC) 09/24/2016  . CVA (cerebral vascular accident) (HCC) 09/23/2016  . Stroke (cerebrum) (HCC) 09/23/2016    Past Surgical History:  Procedure Laterality Date  . PACEMAKER PLACEMENT  05/15/2014   MDT Advisa MRI implanted  at [facility] for sick sinus syndrome     OB History    Gravida      Para      Term      Preterm      AB      Living  2     SAB      TAB      Ectopic      Multiple      Live Births               Home Medications    Prior to Admission medications   Medication Sig Start Date End Date Taking? Authorizing Provider  acetaminophen (TYLENOL) 500 MG tablet Take 500 mg by mouth 2 (two) times daily.    Yes [provider]  acetaminophen (TYLENOL) 500 MG tablet Take 500 mg by mouth every 12 (twelve) hours  as needed for mild pain.   Yes [provider]  amLODipine (NORVASC) 10 MG tablet Take 1 tablet (10 mg total) by mouth 2 (two) times daily. 10mg  in the am and 5mg  in the pm Patient taking differently: Take 10 mg by mouth daily.  08/18/18  Yes Jodelle Gross, NP  amLODipine (NORVASC) 5 MG tablet Take 5 mg by mouth at bedtime.   Yes [provider]  aspirin 81 MG chewable tablet Chew 1 tablet (81 mg total) by mouth daily. With Food 08/08/18  Yes Emokpae, Courage, MD  Cholecalciferol (VITAMIN D-3) 1000 units CAPS Take 1,000 Units by mouth daily.   Yes [provider]  clopidogrel (PLAVIX) 75 MG tablet Take 1 tablet (75 mg total) by mouth daily. 03/13/18  Yes Angiulli, Mcarthur Rossetti, PA-C  dextromethorphan-guaiFENesin (MUCINEX DM) 30-600 MG 12hr tablet Take 1 tablet by mouth 2 (two) times daily. 05/13/18  Yes Roberto Scales D, MD  feeding supplement, ENSURE ENLIVE, (ENSURE ENLIVE) LIQD Take 237 mLs by mouth 2 (two) times daily between meals. 05/14/18  Yes Roberto Scales D, MD  furosemide (LASIX) 80 MG tablet Take 1 tablet (80 mg total) by mouth daily. 08/07/18  Yes Shon Hale, MD  glimepiride (AMARYL) 1 MG tablet Take 1 tablet (1 mg total) by mouth daily with breakfast. 03/13/18  Yes Angiulli, Mcarthur Rossetti, PA-C  isosorbide mononitrate (IMDUR) 60 MG 24 hr tablet Take 1 tablet (60 mg total) by mouth daily. 10/11/16  Yes Angiulli, Mcarthur Rossetti, PA-C  lisinopril (PRINIVIL,ZESTRIL) 20 MG tablet Take 1 tablet (20 mg total) by mouth 2 (two) times daily. 03/13/18  Yes Angiulli, Mcarthur Rossetti, PA-C  Magnesium 250 MG TABS Take 250 mg by mouth at bedtime.   Yes [provider]  metoprolol succinate (TOPROL-XL) 100 MG 24 hr tablet Take 1 tablet (100 mg total) by mouth daily. 03/13/18  Yes Angiulli, Mcarthur Rossetti, PA-C  Multiple Vitamin (MULTIVITAMIN WITH MINERALS) TABS tablet Take 1 tablet by mouth daily. 05/14/18  Yes Roberto Scales D, MD  nitroGLYCERIN (NITROSTAT) 0.4 MG SL tablet Place 0.4 mg under the  tongue every 5 (five) minutes x 3 doses as needed for chest pain (and call 9-1-1 and MD, if no relief).    Yes [provider]  ranolazine (RANEXA) 500 MG 12 hr tablet Take 1 tablet (500 mg total) by mouth 2 (two) times daily. 10/11/16  Yes Angiulli, Mcarthur Rossetti, PA-C  senna (SENOKOT) 8.6 MG TABS tablet Take 2 tablets (17.2 mg total) by mouth at bedtime. Patient taking differently: Take 2 tablets by mouth every Monday, Wednesday, and Friday at 8 PM.  08/07/18  Yes Shon Hale, MD    Family History Family History  Problem Relation Age of Onset  . Lung cancer Sister   . Hypertension Other   . CAD Neg Hx   . Stroke Neg Hx     Social History Social History   Tobacco Use  . Smoking status: Never Smoker  . Smokeless tobacco: Never Used  Substance Use Topics  . Alcohol use: No  . Drug use: Never     Allergies   Statins; Other; Amlodipine; Celecoxib; Codeine; Naproxen; and Piroxicam   Review of Systems Review of Systems Ten systems reviewed and are negative for acute change, except as noted in the HPI.    Physical Exam Updated Vital Signs BP (!) 177/82   Pulse 62   Resp 18   SpO2 100%   Physical Exam  Constitutional: She is oriented to person, place, and time. She appears well-developed and well-nourished. No distress.  Nontoxic appearing and in NAD  HENT:  Head: Normocephalic and atraumatic.  Eyes: Conjunctivae and EOM are normal. No scleral icterus.  Neck: Normal range of motion.  Cardiovascular: Normal rate, regular rhythm and intact distal pulses.  Pulmonary/Chest: Effort normal. No stridor. No respiratory distress. She has no wheezes. She has no rales.  Lungs CTAB. Respirations even and unlabored  Musculoskeletal: Normal range of motion.  Neurological: She is alert and oriented to person, place, and time. No cranial nerve deficit. She exhibits normal muscle tone. Coordination normal.  GCS 15. Speech is goal oriented. No cranial nerve deficits appreciated;  symmetric eyebrow raise, no facial drooping, tongue midline. Patient has equal grip strength bilaterally with 5/5 strength against resistance in all major muscle groups bilaterally. Sensation to light touch intact. Patient moves extremities without ataxia. No pronator drift.  Skin: Skin is warm and dry. No rash noted. She is not diaphoretic. No erythema. No pallor.  Psychiatric: She has a normal mood and affect. Her behavior is normal.  Nursing note and vitals reviewed.    ED Treatments / Results  Labs (all labs ordered are listed, but only abnormal results are displayed) Labs Reviewed  CBG MONITORING, ED    EKG EKG Interpretation  Date/Time:  Sunday September 14 2018 01:38:06 EDT Ventricular Rate:  61 PR Interval:    QRS Duration: 107 QT Interval:  520 QTC Calculation: 524 R Axis:   -29 Text Interpretation:  Sinus rhythm Ventricular premature complex Borderline prolonged PR interval Borderline left axis deviation Abnormal R-wave progression, late transition Prolonged QT interval Baseline wander in lead(s) V6 paced beats not seen on this EKG Confirmed by Eber Hong ([id number]) on 09/14/2018 1:49:11 AM   Radiology Ct Head Wo Contrast  Result Date: 09/14/2018 CLINICAL DATA:  Fall while going to bathroom. EXAM: CT HEAD WITHOUT CONTRAST TECHNIQUE: Contiguous axial images were obtained from the base of the skull through the vertex without intravenous contrast. COMPARISON:  02/24/2018. FINDINGS: Brain: No evidence of acute infarction, hemorrhage, hydrocephalus, extra-axial collection or mass lesion/mass effect. Chronic right basal ganglia lacunar infarcts noted with ex vacuo dilatation of the right lateral ventricle. There is mild diffuse low-attenuation within the subcortical and periventricular white matter compatible with chronic microvascular disease. Prominence of the sulci and ventricles compatible with brain atrophy. Vascular: No hyperdense vessel or unexpected calcification. Skull:  Normal. Negative for fracture or focal lesion. Sinuses/Orbits: No acute finding. Other: None IMPRESSION: 1. No acute intracranial abnormality. 2. Chronic small vessel ischemic disease and brain atrophy. 3. Chronic right basal ganglia lacunar infarct. Electronically Signed  By: Signa Kell M.D.   On: 09/14/2018 00:45    Procedures Procedures (including critical care time)  Medications Ordered in ED Medications - No data to display   Initial Impression / Assessment and Plan / ED Course  I have reviewed the triage vital signs and the nursing notes.  Pertinent labs & imaging results that were available during my care of the patient were reviewed by me and considered in my medical decision making (see chart for details).     84 year old female presents to the emergency department for evaluation following a fall at home.  This was witnessed by her home health aide.  She states that her legs became weak and gave out from under her.  She notes that this does happen on occasion.  She attributes this to her prior strokes.  Patient has a nonfocal neurologic exam today.  She denies any lightheadedness, dizziness, chest pain, shortness of breath preceding her fall.  She was told that she may have hit her head.  She has no signs of overt head injury.  A CT scan was obtained given use of aspirin and Plavix.  This shows no evidence of traumatic intracranial process.  The patient has remained hemodynamically stable while in the department.  No additional complaints of pain.  I believe she is stable for discharge and outpatient recheck by her primary doctor.  This plan was discussed with the patient and her son who verbalized comfort and understanding.  Return precautions discussed and provided. Patient discharged in stable condition with no unaddressed concerns.   Final Clinical Impressions(s) / ED Diagnoses   Final diagnoses:  Fall, initial encounter    ED Discharge Orders    None       Antony Madura, PA-C 09/14/18 1610    Eber Hong, MD 09/14/18 [phone]

## 2018-09-14 DIAGNOSIS — S0990XA Unspecified injury of head, initial encounter: Secondary | ICD-10-CM | POA: Diagnosis not present

## 2018-09-14 LAB — CBG MONITORING, ED: Glucose-Capillary: 104 mg/dL — ABNORMAL HIGH (ref 70–99)

## 2018-09-14 NOTE — ED Notes (Signed)
Call brother is necessary:  Jillyn Hidden @ [phone]

## 2018-09-14 NOTE — Discharge Instructions (Signed)
Your head CT was reassuring and did not show any bleeding or traumatic injury in your brain.  We recommend follow-up with your primary care doctor as needed.  Continue taking your daily medications as prescribed.

## 2018-09-23 ENCOUNTER — Encounter: Payer: Self-pay | Admitting: *Deleted

## 2018-09-29 ENCOUNTER — Encounter: Payer: Medicare Other | Admitting: Cardiology

## 2018-10-18 ENCOUNTER — Emergency Department (HOSPITAL_COMMUNITY): Payer: Medicare Other

## 2018-10-18 ENCOUNTER — Encounter (HOSPITAL_COMMUNITY): Payer: Self-pay | Admitting: Emergency Medicine

## 2018-10-18 ENCOUNTER — Other Ambulatory Visit: Payer: Self-pay

## 2018-10-18 ENCOUNTER — Inpatient Hospital Stay (HOSPITAL_COMMUNITY)
Admission: EM | Admit: 2018-10-18 | Discharge: 2018-10-22 | DRG: 291 | Disposition: A | Discharge status: Home Health | Payer: Medicare Other | Source: Skilled Nursing Facility | Attending: Internal Medicine | Admitting: Internal Medicine

## 2018-10-18 DIAGNOSIS — Z9981 Dependence on supplemental oxygen: Secondary | ICD-10-CM

## 2018-10-18 DIAGNOSIS — I5033 Acute on chronic diastolic (congestive) heart failure: Secondary | ICD-10-CM | POA: Diagnosis present

## 2018-10-18 DIAGNOSIS — Z7982 Long term (current) use of aspirin: Secondary | ICD-10-CM

## 2018-10-18 DIAGNOSIS — E1143 Type 2 diabetes mellitus with diabetic autonomic (poly)neuropathy: Secondary | ICD-10-CM | POA: Diagnosis present

## 2018-10-18 DIAGNOSIS — Z801 Family history of malignant neoplasm of trachea, bronchus and lung: Secondary | ICD-10-CM

## 2018-10-18 DIAGNOSIS — Z66 Do not resuscitate: Secondary | ICD-10-CM | POA: Diagnosis present

## 2018-10-18 DIAGNOSIS — Z993 Dependence on wheelchair: Secondary | ICD-10-CM

## 2018-10-18 DIAGNOSIS — I69354 Hemiplegia and hemiparesis following cerebral infarction affecting left non-dominant side: Secondary | ICD-10-CM

## 2018-10-18 DIAGNOSIS — Z950 Presence of cardiac pacemaker: Secondary | ICD-10-CM

## 2018-10-18 DIAGNOSIS — E119 Type 2 diabetes mellitus without complications: Secondary | ICD-10-CM

## 2018-10-18 DIAGNOSIS — I110 Hypertensive heart disease with heart failure: Principal | ICD-10-CM | POA: Diagnosis present

## 2018-10-18 DIAGNOSIS — Z833 Family history of diabetes mellitus: Secondary | ICD-10-CM

## 2018-10-18 DIAGNOSIS — I495 Sick sinus syndrome: Secondary | ICD-10-CM | POA: Diagnosis present

## 2018-10-18 DIAGNOSIS — I10 Essential (primary) hypertension: Secondary | ICD-10-CM | POA: Diagnosis present

## 2018-10-18 DIAGNOSIS — E876 Hypokalemia: Secondary | ICD-10-CM | POA: Diagnosis not present

## 2018-10-18 DIAGNOSIS — I252 Old myocardial infarction: Secondary | ICD-10-CM

## 2018-10-18 DIAGNOSIS — Z8249 Family history of ischemic heart disease and other diseases of the circulatory system: Secondary | ICD-10-CM

## 2018-10-18 DIAGNOSIS — J9621 Acute and chronic respiratory failure with hypoxia: Secondary | ICD-10-CM | POA: Diagnosis present

## 2018-10-18 DIAGNOSIS — E785 Hyperlipidemia, unspecified: Secondary | ICD-10-CM | POA: Diagnosis present

## 2018-10-18 DIAGNOSIS — I2510 Atherosclerotic heart disease of native coronary artery without angina pectoris: Secondary | ICD-10-CM | POA: Diagnosis present

## 2018-10-18 DIAGNOSIS — J9601 Acute respiratory failure with hypoxia: Secondary | ICD-10-CM | POA: Diagnosis present

## 2018-10-18 DIAGNOSIS — Z7902 Long term (current) use of antithrombotics/antiplatelets: Secondary | ICD-10-CM

## 2018-10-18 LAB — BRAIN NATRIURETIC PEPTIDE: B Natriuretic Peptide: 462.4 pg/mL — ABNORMAL HIGH (ref 0.0–100.0)

## 2018-10-18 LAB — CBC WITH DIFFERENTIAL/PLATELET
Abs Immature Granulocytes: 0.04 10*3/uL (ref 0.00–0.07)
Basophils Absolute: 0 10*3/uL (ref 0.0–0.1)
Basophils Relative: 1 %
Eosinophils Absolute: 0.1 10*3/uL (ref 0.0–0.5)
Eosinophils Relative: 2 %
HCT: 45 % (ref 36.0–46.0)
Hemoglobin: 14.6 g/dL (ref 12.0–15.0)
Immature Granulocytes: 1 %
Lymphocytes Relative: 19 %
Lymphs Abs: 1.1 10*3/uL (ref 0.7–4.0)
MCH: 30.2 pg (ref 26.0–34.0)
MCHC: 32.4 g/dL (ref 30.0–36.0)
MCV: 93 fL (ref 80.0–100.0)
MONOS PCT: 6 %
Monocytes Absolute: 0.3 10*3/uL (ref 0.1–1.0)
Neutro Abs: 4.1 10*3/uL (ref 1.7–7.7)
Neutrophils Relative %: 71 %
Platelets: 226 10*3/uL (ref 150–400)
RBC: 4.84 MIL/uL (ref 3.87–5.11)
RDW: 13.9 % (ref 11.5–15.5)
WBC: 5.6 10*3/uL (ref 4.0–10.5)
nRBC: 0 % (ref 0.0–0.2)

## 2018-10-18 LAB — COMPREHENSIVE METABOLIC PANEL
ALT: 22 U/L (ref 0–44)
AST: 18 U/L (ref 15–41)
Albumin: 4.1 g/dL (ref 3.5–5.0)
Alkaline Phosphatase: 88 U/L (ref 38–126)
Anion gap: 10 (ref 5–15)
BUN: 16 mg/dL (ref 8–23)
CO2: 30 mmol/L (ref 22–32)
Calcium: 9 mg/dL (ref 8.9–10.3)
Chloride: 103 mmol/L (ref 98–111)
Creatinine, Ser: 0.74 mg/dL (ref 0.44–1.00)
GFR calc Af Amer: >60 mL/min (ref >60)
GFR calc non Af Amer: >60 mL/min (ref >60)
Glucose, Bld: 145 mg/dL — ABNORMAL HIGH (ref 70–99)
Potassium: 3.9 mmol/L (ref 3.5–5.1)
Sodium: 143 mmol/L (ref 135–145)
Total Bilirubin: 1 mg/dL (ref 0.3–1.2)
Total Protein: 7.1 g/dL (ref 6.5–8.1)

## 2018-10-18 LAB — TROPONIN I: Troponin I: <0.03 ng/mL (ref <0.03)

## 2018-10-18 MED ORDER — FUROSEMIDE 10 MG/ML IJ SOLN
40.0000 mg | Freq: Once | INTRAMUSCULAR | Status: AC
  Administered 2018-10-19: 40 mg via INTRAVENOUS
  Filled 2018-10-18: qty 4

## 2018-10-18 MED ORDER — SODIUM CHLORIDE (PF) 0.9 % IJ SOLN
INTRAMUSCULAR | Status: AC
  Filled 2018-10-18: qty 50

## 2018-10-18 MED ORDER — IOPAMIDOL (ISOVUE-370) INJECTION 76%
100.0000 mL | Freq: Once | INTRAVENOUS | Status: AC | PRN
  Administered 2018-10-18: 100 mL via INTRAVENOUS

## 2018-10-18 MED ORDER — IOPAMIDOL (ISOVUE-370) INJECTION 76%
INTRAVENOUS | Status: AC
  Filled 2018-10-18: qty 100

## 2018-10-18 NOTE — ED Triage Notes (Signed)
[facility]. Hx CHF. 3L [place] at baseline. Denies cough and pain.

## 2018-10-18 NOTE — ED Notes (Signed)
Bed: WA07 Expected date:  Expected time:  Means of arrival:  Comments: EMS: SOB leg swelling

## 2018-10-18 NOTE — ED Notes (Signed)
Patient transported to X-ray 

## 2018-10-18 NOTE — ED Notes (Signed)
ED Provider at bedside. 

## 2018-10-18 NOTE — ED Provider Notes (Signed)
[facility]-EMERGENCY DEPT Provider Note   CSN: [id number] Arrival date & time: 10/18/18  1949     History   Chief Complaint Chief Complaint  Patient presents with  . Shortness of Breath  . Cough    HPI Charlene Greene is a 84 y.o. female.  HPI Patient is a 84 year old female presents the emergency department complaints of progressive worsening of her shortness of breath over the past 3 days.  History of congestive heart failure.  She is on 3 L nasal cannula at baseline but this is been increased to 4 L.  She has a history of diastolic heart failure.  No prior history of DVT or pulmonary embolism.  Family reports increasing lower extremity swelling left greater than right.  She is wheelchair-bound.  She is not on anticoagulation.  She has been on oxygen for the past 6 months.  Daughter is an ICU nurse.  It is not clear why she was ever placed on oxygen.  She does not have a history of asthma or COPD.  No history of pulmonary hypertension.  She has not seen a pulmonologist.  She denies fevers and chills.  She is a mild cough.  She reports increasing generalized weakness over the past several days with exertional shortness of breath with transferring   Past Medical History:  Diagnosis Date  . CAD (coronary artery disease)   . Diabetes mellitus without complication (HCC)   . Hyperlipidemia   . Hypertension   . Pacemaker   . Renal artery stenosis (HCC)    s/p bilateral stents  . Sick sinus syndrome (HCC)   . Stroke [facility])    2 previous cva's    Patient Active Problem List   Diagnosis Date Noted  . Non-ST elevation (NSTEMI) myocardial infarction (HCC) 08/05/2018  . Oxygen dependent 08/02/2018  . Elevated troponin 08/02/2018  . Pressure injury of skin 05/12/2018  . HCAP (healthcare-associated pneumonia) 05/08/2018  . Acute respiratory failure with hypoxia (HCC) 05/07/2018  . Sleep disturbance   . Elevated BUN   . Labile blood pressure   . Essential  hypertension   . Dysphasia, post-stroke   . PAD (peripheral artery disease) (HCC)   . Hypoalbuminemia due to protein-calorie malnutrition (HCC)   . Diabetic peripheral neuropathy (HCC)   . Diabetes mellitus type 2 in nonobese (HCC)   . Chronic diastolic congestive heart failure (HCC)   . Left thalamic infarction (HCC) 02/26/2018  . Dysphagia, post-stroke   . Acute on chronic diastolic congestive heart failure (HCC)   . Type 2 diabetes mellitus with peripheral neuropathy (HCC)   . TIA (transient ischemic attack) 02/22/2018  . Labile blood glucose   . Hypotension due to drugs   . Hyponatremia   . Renovascular hypertension   . Hypertensive crisis   . Type 2 diabetes mellitus with diabetic autonomic neuropathy, without long-term current use of insulin (HCC)   . Stage 3 chronic kidney disease (HCC)   . Acute lower UTI   . Acute renal insufficiency 09/27/2016  . Urinary urgency 09/27/2016  . Basal ganglia infarction (HCC) 09/26/2016  . Neuropathic pain   . Benign essential HTN   . Slow transit constipation   . Elevated creatine kinase   . Accelerated hypertension 09/24/2016  . Diabetes mellitus (HCC) 09/24/2016  . Hyperlipidemia 09/24/2016  . Sick sinus syndrome (HCC) 09/24/2016  . CVA (cerebral vascular accident) (HCC) 09/23/2016  . Stroke (cerebrum) (HCC) 09/23/2016    Past Surgical History:  Procedure Laterality Date  .  PACEMAKER PLACEMENT  05/15/2014   MDT Advisa MRI implanted at [facility] for sick sinus syndrome     OB History    Gravida      Para      Term      Preterm      AB      Living  2     SAB      TAB      Ectopic      Multiple      Live Births               Home Medications    Prior to Admission medications   Medication Sig Start Date End Date Taking? Authorizing Provider  acetaminophen (TYLENOL) 500 MG tablet Take 500 mg by mouth 2 (two) times daily.    Yes [provider]  amLODipine (NORVASC) 10 MG tablet Take 1 tablet (10  mg total) by mouth 2 (two) times daily. 10mg  in the am and 5mg  in the pm Patient taking differently: Take 10 mg by mouth daily.  08/18/18  Yes Jodelle GrossLawrence, Kathryn M, NP  amLODipine (NORVASC) 5 MG tablet Take 5 mg by mouth at bedtime.   Yes [provider]  aspirin 81 MG chewable tablet Chew 1 tablet (81 mg total) by mouth daily. With Food 08/08/18  Yes Emokpae, Courage, MD  cholecalciferol (VITAMIN D3) 25 MCG (1000 UT) tablet Take 1,000 Units by mouth daily.   Yes [provider]  clopidogrel (PLAVIX) 75 MG tablet Take 1 tablet (75 mg total) by mouth daily. 03/13/18  Yes Angiulli, Mcarthur Rossettianiel J, PA-C  dextromethorphan-guaiFENesin (MUCINEX DM) 30-600 MG 12hr tablet Take 1 tablet by mouth 2 (two) times daily. 05/13/18  Yes Roberto ScalesNettey, Shayla D, MD  feeding supplement, ENSURE ENLIVE, (ENSURE ENLIVE) LIQD Take 237 mLs by mouth 2 (two) times daily between meals. 05/14/18  Yes Roberto ScalesNettey, Shayla D, MD  furosemide (LASIX) 80 MG tablet Take 1 tablet (80 mg total) by mouth daily. 08/07/18  Yes Shon HaleEmokpae, Courage, MD  glimepiride (AMARYL) 1 MG tablet Take 1 tablet (1 mg total) by mouth daily with breakfast. 03/13/18  Yes Angiulli, Mcarthur Rossettianiel J, PA-C  isosorbide mononitrate (IMDUR) 60 MG 24 hr tablet Take 1 tablet (60 mg total) by mouth daily. 10/11/16  Yes Angiulli, Mcarthur Rossettianiel J, PA-C  lisinopril (PRINIVIL,ZESTRIL) 20 MG tablet Take 1 tablet (20 mg total) by mouth 2 (two) times daily. 03/13/18  Yes Angiulli, Mcarthur Rossettianiel J, PA-C  Magnesium 250 MG TABS Take 250 mg by mouth at bedtime.   Yes [provider]  metoprolol succinate (TOPROL-XL) 100 MG 24 hr tablet Take 1 tablet (100 mg total) by mouth daily. 03/13/18  Yes Angiulli, Mcarthur Rossettianiel J, PA-C  Multiple Vitamin (MULTIVITAMIN WITH MINERALS) TABS tablet Take 1 tablet by mouth daily. 05/14/18  Yes Roberto ScalesNettey, Shayla D, MD  ranolazine (RANEXA) 500 MG 12 hr tablet Take 1 tablet (500 mg total) by mouth 2 (two) times daily. 10/11/16  Yes Angiulli, Mcarthur Rossettianiel J, PA-C  senna (SENOKOT) 8.6 MG TABS  tablet Take 2 tablets (17.2 mg total) by mouth at bedtime. Patient taking differently: Take 2 tablets by mouth every Monday, Wednesday, and Friday at 8 PM.  08/07/18  Yes Emokpae, Courage, MD  acetaminophen (TYLENOL) 500 MG tablet Take 500 mg by mouth every 12 (twelve) hours as needed for mild pain.    [provider]  nitroGLYCERIN (NITROSTAT) 0.4 MG SL tablet Place 0.4 mg under the tongue every 5 (five) minutes x 3 doses as needed for  chest pain (and call 9-1-1 and MD, if no relief).     [provider]    Family History Family History  Problem Relation Age of Onset  . Lung cancer Sister   . Heart failure Sister   . Heart disease Sister   . Vision loss Sister   . Mental illness Sister   . Hypertension Son   . Vision loss Son   . Vision loss Mother   . Vision loss Father   . Depression Father   . Diabetes Paternal Aunt   . Diabetes Maternal Aunt   . CAD Neg Hx   . Stroke Neg Hx     Social History Social History   Tobacco Use  . Smoking status: Never Smoker  . Smokeless tobacco: Never Used  Substance Use Topics  . Alcohol use: No  . Drug use: Never     Allergies   Statins; Other; Amlodipine; Celecoxib; Codeine; Naproxen; and Piroxicam   Review of Systems Review of Systems  All other systems reviewed and are negative.    Physical Exam Updated Vital Signs BP (!) 190/96   Pulse 65   Temp 97.9 F (36.6 C) (Oral)   Resp 18   SpO2 95%   Physical Exam  Constitutional: She is oriented to person, place, and time. She appears well-developed and well-nourished. No distress.  HENT:  Head: Normocephalic and atraumatic.  Eyes: EOM are normal.  Neck: Normal range of motion.  Cardiovascular: Normal rate, regular rhythm and normal heart sounds.  Pulmonary/Chest: Breath sounds normal. No stridor. No respiratory distress. She has no wheezes.  Decreased breath sounds in the bases  Abdominal: Soft. She exhibits no distension. There is no tenderness.    Musculoskeletal: Normal range of motion. She exhibits edema.  Neurological: She is alert and oriented to person, place, and time.  Skin: Skin is warm and dry.  Psychiatric: She has a normal mood and affect. Judgment normal.  Nursing note and vitals reviewed.    ED Treatments / Results  Labs (all labs ordered are listed, but only abnormal results are displayed) Labs Reviewed  COMPREHENSIVE METABOLIC PANEL - Abnormal; Notable for the following components:      Result Value   Glucose, Bld 145 (*)    All other components within normal limits  BRAIN NATRIURETIC PEPTIDE - Abnormal; Notable for the following components:   B Natriuretic Peptide 462.4 (*)    All other components within normal limits  CBC WITH DIFFERENTIAL/PLATELET  TROPONIN I    EKG EKG Interpretation  Date/Time:  Sunday October 19 2018 01:18:16 EST Ventricular Rate:  64 PR Interval:    QRS Duration: 104 QT Interval:  528 QTC Calculation: 528 R Axis:   -25 Text Interpretation:  Atrial-paced complexes Ventricular premature complex Prolonged PR interval Borderline left axis deviation Low voltage, precordial leads Nonspecific T abnrm, anterolateral leads Prolonged QT interval Baseline wander in lead(s) V3 Confirmed by Rolan Bucco [phone]) on 10/20/2018 9:49:20 AM   Radiology Dg Chest 2 View  Result Date: 10/18/2018 CLINICAL DATA:  Shortness of breath for 3 days. EXAM: CHEST - 2 VIEW COMPARISON:  08/04/2018 FINDINGS: 2152 hours. Low lung volumes. Bibasilar collapse/consolidation appears progressed in the interval. Associated small bilateral pleural effusions. There is pulmonary vascular congestion without overt pulmonary edema. The cardio pericardial silhouette is enlarged. The visualized bony structures of the thorax are intact. Degenerative changes are noted in both shoulders. Left-sided permanent pacemaker again noted. Telemetry leads overlie the chest. IMPRESSION: Cardiomegaly with progression of  bibasilar  collapse/consolidation. Small bilateral pleural effusions. Vascular congestion. Electronically Signed   By: Kennith Center M.D.   On: 10/18/2018 22:21    Procedures Procedures (including critical care time)  Medications Ordered in ED Medications  furosemide (LASIX) injection 40 mg (has no administration in time range)     Initial Impression / Assessment and Plan / ED Course  I have reviewed the triage vital signs and the nursing notes.  Pertinent labs & imaging results that were available during my care of the patient were reviewed by me and considered in my medical decision making (see chart for details).     Volume overload consistent with congestive heart failure.  IV Lasix in the emergency department department.  Admission for ongoing IV diuresis.  Consultant: [facility] hospitalist  Final Clinical Impressions(s) / ED Diagnoses   Final diagnoses:  Acute on chronic diastolic congestive heart failure [facility])    ED Discharge Orders    None       Azalia Bilis, MD 10/21/18 [phone]

## 2018-10-18 NOTE — ED Notes (Signed)
Patient given OJ.

## 2018-10-19 ENCOUNTER — Other Ambulatory Visit: Payer: Self-pay

## 2018-10-19 DIAGNOSIS — I495 Sick sinus syndrome: Secondary | ICD-10-CM | POA: Diagnosis present

## 2018-10-19 DIAGNOSIS — E1149 Type 2 diabetes mellitus with other diabetic neurological complication: Secondary | ICD-10-CM | POA: Diagnosis not present

## 2018-10-19 DIAGNOSIS — Z9981 Dependence on supplemental oxygen: Secondary | ICD-10-CM | POA: Diagnosis not present

## 2018-10-19 DIAGNOSIS — I2510 Atherosclerotic heart disease of native coronary artery without angina pectoris: Secondary | ICD-10-CM | POA: Diagnosis present

## 2018-10-19 DIAGNOSIS — I10 Essential (primary) hypertension: Secondary | ICD-10-CM | POA: Diagnosis not present

## 2018-10-19 DIAGNOSIS — Z833 Family history of diabetes mellitus: Secondary | ICD-10-CM | POA: Diagnosis not present

## 2018-10-19 DIAGNOSIS — Z950 Presence of cardiac pacemaker: Secondary | ICD-10-CM | POA: Diagnosis not present

## 2018-10-19 DIAGNOSIS — E782 Mixed hyperlipidemia: Secondary | ICD-10-CM

## 2018-10-19 DIAGNOSIS — E785 Hyperlipidemia, unspecified: Secondary | ICD-10-CM | POA: Diagnosis present

## 2018-10-19 DIAGNOSIS — Z993 Dependence on wheelchair: Secondary | ICD-10-CM | POA: Diagnosis not present

## 2018-10-19 DIAGNOSIS — Z66 Do not resuscitate: Secondary | ICD-10-CM | POA: Diagnosis present

## 2018-10-19 DIAGNOSIS — Z8249 Family history of ischemic heart disease and other diseases of the circulatory system: Secondary | ICD-10-CM | POA: Diagnosis not present

## 2018-10-19 DIAGNOSIS — Z7902 Long term (current) use of antithrombotics/antiplatelets: Secondary | ICD-10-CM | POA: Diagnosis not present

## 2018-10-19 DIAGNOSIS — J9601 Acute respiratory failure with hypoxia: Secondary | ICD-10-CM

## 2018-10-19 DIAGNOSIS — Z7982 Long term (current) use of aspirin: Secondary | ICD-10-CM | POA: Diagnosis not present

## 2018-10-19 DIAGNOSIS — Z801 Family history of malignant neoplasm of trachea, bronchus and lung: Secondary | ICD-10-CM | POA: Diagnosis not present

## 2018-10-19 DIAGNOSIS — E876 Hypokalemia: Secondary | ICD-10-CM | POA: Diagnosis not present

## 2018-10-19 DIAGNOSIS — J9621 Acute and chronic respiratory failure with hypoxia: Secondary | ICD-10-CM | POA: Diagnosis present

## 2018-10-19 DIAGNOSIS — I69354 Hemiplegia and hemiparesis following cerebral infarction affecting left non-dominant side: Secondary | ICD-10-CM | POA: Diagnosis not present

## 2018-10-19 DIAGNOSIS — I5033 Acute on chronic diastolic (congestive) heart failure: Secondary | ICD-10-CM | POA: Diagnosis present

## 2018-10-19 DIAGNOSIS — E1143 Type 2 diabetes mellitus with diabetic autonomic (poly)neuropathy: Secondary | ICD-10-CM | POA: Diagnosis present

## 2018-10-19 DIAGNOSIS — I110 Hypertensive heart disease with heart failure: Secondary | ICD-10-CM | POA: Diagnosis present

## 2018-10-19 DIAGNOSIS — I252 Old myocardial infarction: Secondary | ICD-10-CM | POA: Diagnosis not present

## 2018-10-19 LAB — CBC
HCT: 43.3 % (ref 36.0–46.0)
Hemoglobin: 14 g/dL (ref 12.0–15.0)
MCH: 29.5 pg (ref 26.0–34.0)
MCHC: 32.3 g/dL (ref 30.0–36.0)
MCV: 91.2 fL (ref 80.0–100.0)
Platelets: 226 10*3/uL (ref 150–400)
RBC: 4.75 MIL/uL (ref 3.87–5.11)
RDW: 13.7 % (ref 11.5–15.5)
WBC: 5.4 10*3/uL (ref 4.0–10.5)
nRBC: 0 % (ref 0.0–0.2)

## 2018-10-19 LAB — MRSA PCR SCREENING: MRSA by PCR: NEGATIVE

## 2018-10-19 LAB — GLUCOSE, CAPILLARY
Glucose-Capillary: 151 mg/dL — ABNORMAL HIGH (ref 70–99)
Glucose-Capillary: 161 mg/dL — ABNORMAL HIGH (ref 70–99)
Glucose-Capillary: 77 mg/dL (ref 70–99)
Glucose-Capillary: 135 mg/dL — ABNORMAL HIGH (ref 70–99)

## 2018-10-19 LAB — COMPREHENSIVE METABOLIC PANEL
ALT: 19 U/L (ref 0–44)
AST: 14 U/L — ABNORMAL LOW (ref 15–41)
Albumin: 3.6 g/dL (ref 3.5–5.0)
Alkaline Phosphatase: 75 U/L (ref 38–126)
Anion gap: 11 (ref 5–15)
BUN: 14 mg/dL (ref 8–23)
CHLORIDE: 97 mmol/L — AB (ref 98–111)
CO2: 34 mmol/L — ABNORMAL HIGH (ref 22–32)
CREATININE: 0.67 mg/dL (ref 0.44–1.00)
Calcium: 8.7 mg/dL — ABNORMAL LOW (ref 8.9–10.3)
GFR calc Af Amer: >60 mL/min (ref >60)
GFR calc non Af Amer: >60 mL/min (ref >60)
Glucose, Bld: 149 mg/dL — ABNORMAL HIGH (ref 70–99)
Potassium: 3.2 mmol/L — ABNORMAL LOW (ref 3.5–5.1)
Sodium: 142 mmol/L (ref 135–145)
Total Bilirubin: 0.7 mg/dL (ref 0.3–1.2)
Total Protein: 6.4 g/dL — ABNORMAL LOW (ref 6.5–8.1)

## 2018-10-19 LAB — TROPONIN I
Troponin I: <0.03 ng/mL (ref <0.03)
Troponin I: <0.03 ng/mL (ref <0.03)

## 2018-10-19 LAB — PREALBUMIN: Prealbumin: 20.2 mg/dL (ref 18–38)

## 2018-10-19 LAB — MAGNESIUM: Magnesium: 1.9 mg/dL (ref 1.7–2.4)

## 2018-10-19 LAB — TSH: TSH: 4.387 u[IU]/mL (ref 0.350–4.500)

## 2018-10-19 LAB — HEMOGLOBIN A1C
Hgb A1c MFr Bld: 6.4 % — ABNORMAL HIGH (ref 4.8–5.6)
Mean Plasma Glucose: 136.98 mg/dL

## 2018-10-19 LAB — PHOSPHORUS: Phosphorus: 4 mg/dL (ref 2.5–4.6)

## 2018-10-19 MED ORDER — ENOXAPARIN SODIUM 40 MG/0.4ML ~~LOC~~ SOLN
40.0000 mg | SUBCUTANEOUS | Status: DC
  Administered 2018-10-19 – 2018-10-21 (×3): 40 mg via SUBCUTANEOUS
  Filled 2018-10-19 (×3): qty 0.4

## 2018-10-19 MED ORDER — ISOSORBIDE MONONITRATE ER 60 MG PO TB24
60.0000 mg | ORAL_TABLET | Freq: Every day | ORAL | Status: DC
  Administered 2018-10-19 – 2018-10-22 (×4): 60 mg via ORAL
  Filled 2018-10-19 (×4): qty 1

## 2018-10-19 MED ORDER — FUROSEMIDE 10 MG/ML IJ SOLN
40.0000 mg | Freq: Two times a day (BID) | INTRAMUSCULAR | Status: DC
  Administered 2018-10-19 – 2018-10-21 (×5): 40 mg via INTRAVENOUS
  Filled 2018-10-19 (×5): qty 4

## 2018-10-19 MED ORDER — ONDANSETRON HCL 4 MG/2ML IJ SOLN: 4.0000 mg | Freq: Four times a day (QID) | INTRAMUSCULAR | Status: DC | PRN

## 2018-10-19 MED ORDER — SODIUM CHLORIDE 0.9 % IV SOLN: 250.0000 mL | INTRAVENOUS | Status: DC | PRN

## 2018-10-19 MED ORDER — INSULIN ASPART 100 UNIT/ML ~~LOC~~ SOLN
0.0000 [IU] | Freq: Three times a day (TID) | SUBCUTANEOUS | Status: DC
  Administered 2018-10-19 (×2): 2 [IU] via SUBCUTANEOUS
  Administered 2018-10-20 (×2): 1 [IU] via SUBCUTANEOUS
  Administered 2018-10-20: 2 [IU] via SUBCUTANEOUS
  Administered 2018-10-21 (×2): 1 [IU] via SUBCUTANEOUS
  Administered 2018-10-21: 5 [IU] via SUBCUTANEOUS
  Administered 2018-10-22: 1 [IU] via SUBCUTANEOUS
  Administered 2018-10-22: 7 [IU] via SUBCUTANEOUS

## 2018-10-19 MED ORDER — LISINOPRIL 20 MG PO TABS
20.0000 mg | ORAL_TABLET | Freq: Two times a day (BID) | ORAL | Status: DC
  Administered 2018-10-19 – 2018-10-22 (×8): 20 mg via ORAL
  Filled 2018-10-19 (×8): qty 1

## 2018-10-19 MED ORDER — AMLODIPINE BESYLATE 10 MG PO TABS
10.0000 mg | ORAL_TABLET | Freq: Every day | ORAL | Status: DC
  Administered 2018-10-19 – 2018-10-22 (×4): 10 mg via ORAL
  Filled 2018-10-19 (×4): qty 1

## 2018-10-19 MED ORDER — SENNA 8.6 MG PO TABS: 2.0000 | ORAL_TABLET | ORAL | Status: DC

## 2018-10-19 MED ORDER — SODIUM CHLORIDE 0.9% FLUSH
3.0000 mL | INTRAVENOUS | Status: DC | PRN
  Administered 2018-10-19: 3 mL via INTRAVENOUS
  Filled 2018-10-19: qty 3

## 2018-10-19 MED ORDER — POTASSIUM CHLORIDE CRYS ER 20 MEQ PO TBCR
40.0000 meq | EXTENDED_RELEASE_TABLET | Freq: Once | ORAL | Status: AC
  Administered 2018-10-19: 40 meq via ORAL
  Filled 2018-10-19: qty 2

## 2018-10-19 MED ORDER — INSULIN ASPART 100 UNIT/ML ~~LOC~~ SOLN: 0.0000 [IU] | Freq: Every day | SUBCUTANEOUS | Status: DC

## 2018-10-19 MED ORDER — ACETAMINOPHEN 650 MG RE SUPP: 650.0000 mg | Freq: Four times a day (QID) | RECTAL | Status: DC | PRN

## 2018-10-19 MED ORDER — RANOLAZINE ER 500 MG PO TB12
500.0000 mg | ORAL_TABLET | Freq: Two times a day (BID) | ORAL | Status: DC
  Administered 2018-10-19 – 2018-10-22 (×8): 500 mg via ORAL
  Filled 2018-10-19 (×8): qty 1

## 2018-10-19 MED ORDER — CLOPIDOGREL BISULFATE 75 MG PO TABS
75.0000 mg | ORAL_TABLET | Freq: Every day | ORAL | Status: DC
  Administered 2018-10-19 – 2018-10-22 (×4): 75 mg via ORAL
  Filled 2018-10-19 (×4): qty 1

## 2018-10-19 MED ORDER — ONDANSETRON HCL 4 MG PO TABS: 4.0000 mg | ORAL_TABLET | Freq: Four times a day (QID) | ORAL | Status: DC | PRN

## 2018-10-19 MED ORDER — ASPIRIN 81 MG PO CHEW
81.0000 mg | CHEWABLE_TABLET | Freq: Every day | ORAL | Status: DC
  Administered 2018-10-19 – 2018-10-22 (×4): 81 mg via ORAL
  Filled 2018-10-19 (×4): qty 1

## 2018-10-19 MED ORDER — ORAL CARE MOUTH RINSE: 15.0000 mL | Freq: Two times a day (BID) | OROMUCOSAL | Status: DC

## 2018-10-19 MED ORDER — ACETAMINOPHEN 325 MG PO TABS: 650.0000 mg | ORAL_TABLET | Freq: Four times a day (QID) | ORAL | Status: DC | PRN

## 2018-10-19 MED ORDER — METOPROLOL SUCCINATE ER 100 MG PO TB24
100.0000 mg | ORAL_TABLET | Freq: Every day | ORAL | Status: DC
  Administered 2018-10-19 – 2018-10-22 (×4): 100 mg via ORAL
  Filled 2018-10-19 (×4): qty 1

## 2018-10-19 MED ORDER — SODIUM CHLORIDE 0.9% FLUSH
3.0000 mL | Freq: Two times a day (BID) | INTRAVENOUS | Status: DC
  Administered 2018-10-19 – 2018-10-22 (×8): 3 mL via INTRAVENOUS

## 2018-10-19 NOTE — Progress Notes (Signed)
PROGRESS NOTE  Charlene Greene Basu ZOX:[id number] DOB: 12-Dec-1921 DOA: 10/18/2018 PCP: [facility]  HPI/Recap of past 24 hours: HPI from Dr Jeanne Ivanoutova Djuana Overdorf is a 84 y.o. female with medical history significant for CAD, chronic diastolic heart failure, hypertension, sick sinus syndrome status post [employer] dual-chamber pacemaker, hyperlipidemia, CVAs with residualLeft sided weakness, DM2, chronic respiratory failure on O2 3L at baseline, presents to the ED with progressive SOB, dyspnea, requiring increasing O2, BLE edema. Denies any fever/chills or hx of COPD. Pt resides in assisted living and complying to home medications. Last admit in September 2019 for NSTEMI and Vtach, cardiology consulted felt that  elevated troponin likely due to mild ischemia in the setting of diastolic dysfunction HF, medications were continued and was discharged on Lasix 80 mg a day. Pt admitted for further management.   Today, pt reported her breathing is about the same as she came in, denies its worsening, denies any chest pain, fever/chills, cough.  Assessment/Plan: Active Problems:   Accelerated hypertension   Diabetes mellitus (HCC)   Hyperlipidemia   Sick sinus syndrome (HCC)   Type 2 diabetes mellitus with diabetic autonomic neuropathy, without long-term current use of insulin (HCC)   Acute on chronic diastolic congestive heart failure (HCC)   Acute respiratory failure with hypoxia (HCC)   Oxygen dependent   Acute on chronic diastolic CHF (congestive heart failure) (HCC)  Acute on chronic diastolic HF BNP 462, about 900, 2 months ago Troponin negative EKG showed paced rhythm, prolonged QTC Chest x-ray with cardiomegaly with progression of by basilar collapse Vs consolidation, with vascular congestion Echo done on 02/2018 showed EF of 65 to 70%, grade 1 diastolic dysfunction Continue IV Lasix Strict I's and O's, daily weights Monitor on telemetry for another 24 hours  Acute on chronic  hypoxic respiratory failure Oxygen dependent, on home O2 3 L Likely due to above Continue supplemental oxygen Management as above We will hold off on starting any antibiotics, as patient has not developed any signs of pneumonia  Hypokalemia Replace PRN  Type 2 diabetes mellitus Last A1c, 6.4, controlled Continue SSI, Accu-Cheks, hypoglycemic protocol Held home glimepiride  Hypertension Stable Continue amlodipine, imdur, lisinopril  History of CAD Currently denies any chest pain Continue Plavix, imdur, lisinopril, metoprolol, ranolazine, ASA  History of CVA Stable residual left-sided weakness Continue ASA, Plavix  Sick sinus syndrome Status post pacemaker        Malnutrition Type:      Malnutrition Characteristics:      Nutrition Interventions:       Estimated body mass index is 26.22 kg/m as calculated from the following:   Height as of 08/18/18: 5' 5.5" (1.664 m).   Weight as of 08/18/18: 72.6 kg.     Code Status: DNR  Family Communication: None at bedside  Disposition Plan: Back to ALF   Consultants:  None  Procedures:  None  Antimicrobials:  None  DVT prophylaxis: Lovenox   Objective: Vitals:   10/19/18 0153 10/19/18 0552 10/19/18 1000 10/19/18 1230  BP: (!) 184/89 (!) 176/82 (!) 144/73 130/71  Pulse: 61 60 61 (!) 59  Resp: 18 16 14 18   Temp: 97.6 F (36.4 C) 98.3 F (36.8 C) (!) 97.5 F (36.4 C) (!) 97.5 F (36.4 C)  TempSrc: Oral Oral Oral Oral  SpO2: 97% 96% 100% 96%    Intake/Output Summary (Last 24 hours) at 10/19/2018 1242 Last data filed at 10/19/2018 0853 Gross per 24 hour  Intake 300 ml  Output 2601 ml  Net -2301 ml   There were no vitals filed for this visit.  Exam:   General: NAD, alert, oriented  Cardiovascular: S1, S2 present  Respiratory: Bibasilar crackles  Abdomen: Soft, nontender, nondistended, bowel sounds present  Musculoskeletal: Trace bilateral edema  Skin: Scattered ecchymosis  noted  Psychiatry: Normal mood   Data Reviewed: CBC: Recent Labs  Lab 10/18/18 2129 10/19/18 0615  WBC 5.6 5.4  NEUTROABS 4.1  --   HGB 14.6 14.0  HCT 45.0 43.3  MCV 93.0 91.2  PLT 226 226   Basic Metabolic Panel: Recent Labs  Lab 10/18/18 2129 10/19/18 0615  NA 143 142  K 3.9 3.2*  CL 103 97*  CO2 30 34*  GLUCOSE 145* 149*  BUN 16 14  CREATININE 0.74 0.67  CALCIUM 9.0 8.7*  MG  --  1.9  PHOS  --  4.0   GFR: CrCl cannot be calculated (Unknown ideal weight.). Liver Function Tests: Recent Labs  Lab 10/18/18 2129 10/19/18 0615  AST 18 14*  ALT 22 19  ALKPHOS 88 75  BILITOT 1.0 0.7  PROT 7.1 6.4*  ALBUMIN 4.1 3.6   No results for input(s): LIPASE, AMYLASE in the last 168 hours. No results for input(s): AMMONIA in the last 168 hours. Coagulation Profile: No results for input(s): INR, PROTIME in the last 168 hours. Cardiac Enzymes: Recent Labs  Lab 10/18/18 2129 10/19/18 0211  TROPONINI <0.03 <0.03   BNP (last 3 results) No results for input(s): PROBNP in the last 8760 hours. HbA1C: Recent Labs    10/19/18 0615  HGBA1C 6.4*   CBG: Recent Labs  Lab 10/19/18 0738 10/19/18 1130  GLUCAP 151* 161*   Lipid Profile: No results for input(s): CHOL, HDL, LDLCALC, TRIG, CHOLHDL, LDLDIRECT in the last 72 hours. Thyroid Function Tests: Recent Labs    10/19/18 0615  TSH 4.387   Anemia Panel: No results for input(s): VITAMINB12, FOLATE, FERRITIN, TIBC, IRON, RETICCTPCT in the last 72 hours. Urine analysis:    Component Value Date/Time   COLORURINE YELLOW 02/22/2018 2000   APPEARANCEUR CLEAR 02/22/2018 2000   LABSPEC 1.013 02/22/2018 2000   PHURINE 6.0 02/22/2018 2000   GLUCOSEU 50 (A) 02/22/2018 2000   HGBUR NEGATIVE 02/22/2018 2000   BILIRUBINUR NEGATIVE 02/22/2018 2000   KETONESUR NEGATIVE 02/22/2018 2000   PROTEINUR NEGATIVE 02/22/2018 2000   NITRITE NEGATIVE 02/22/2018 2000   LEUKOCYTESUR MODERATE (A) 02/22/2018 2000   Sepsis  Labs: @LABRCNTIP (procalcitonin:4,lacticidven:4)  )No results found for this or any previous visit (from the past 240 hour(s)).    Studies: Dg Chest 2 View  Result Date: 10/18/2018 CLINICAL DATA:  Shortness of breath for 3 days. EXAM: CHEST - 2 VIEW COMPARISON:  08/04/2018 FINDINGS: 2152 hours. Low lung volumes. Bibasilar collapse/consolidation appears progressed in the interval. Associated small bilateral pleural effusions. There is pulmonary vascular congestion without overt pulmonary edema. The cardio pericardial silhouette is enlarged. The visualized bony structures of the thorax are intact. Degenerative changes are noted in both shoulders. Left-sided permanent pacemaker again noted. Telemetry leads overlie the chest. IMPRESSION: Cardiomegaly with progression of bibasilar collapse/consolidation. Small bilateral pleural effusions. Vascular congestion. Electronically Signed   By: Kennith Center M.D.   On: 10/18/2018 22:21   Ct Angio Chest Pe W And/or Wo Contrast  Result Date: 10/19/2018 CLINICAL DATA:  84 year old female with shortness of breath. Chest pain. EXAM: CT ANGIOGRAPHY CHEST WITH CONTRAST TECHNIQUE: Multidetector CT imaging of the chest was performed using the standard protocol during bolus administration of intravenous contrast. Multiplanar  CT image reconstructions and MIPs were obtained to evaluate the vascular anatomy. CONTRAST:  ISOVUE-370 IOPAMIDOL (ISOVUE-370) INJECTION 76% COMPARISON:  Chest radiograph dated 10/18/2018 FINDINGS: Evaluation is limited due to streak artifact caused by patient's arms as well as metallic pacemaker device. Cardiovascular: There is mild cardiomegaly. Coronary vascular calcification and cardiac pacemaker wire noted. No pericardial effusion. There is moderate atherosclerotic calcification of the thoracic aorta. There is no CT evidence of pulmonary embolism. Mediastinum/Nodes: No hilar or mediastinal adenopathy. Esophagus is grossly unremarkable. No  mediastinal fluid collection. Lungs/Pleura: Moderate bilateral pleural effusions with associated partial compressive atelectasis of the lower lobes. Pneumonia is not excluded. Clinical correlation is recommended. Patchy airspace ground-glass opacity in the upper lobes with interlobular septal prominence likely a degree of edema. There is no pneumothorax. 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 The central airways remain patent. Upper Abdomen: There is retrograde flow of contrast from the right atrium into the IVC consistent with right heart dysfunction. The visualized upper abdomen is otherwise unremarkable. Musculoskeletal: Osteopenia with degenerative changes of the spine. No acute osseous pathology. Review of the MIP images confirms the above findings. IMPRESSION: 1. No CT evidence of pulmonary embolism. 2. Cardiomegaly with findings of CHF. Moderate bilateral pleural effusions with associated partial compressive atelectasis of the lower lobes. Pneumonia is not excluded. Clinical correlation is recommended. Electronically Signed   By: Elgie Collard M.D.   On: 10/19/2018 00:27    Scheduled Meds: . amLODipine  10 mg Oral Daily  . aspirin  81 mg Oral Daily  . clopidogrel  75 mg Oral Q breakfast  . furosemide  40 mg Intravenous Q12H  . insulin aspart  0-5 Units Subcutaneous QHS  . insulin aspart  0-9 Units Subcutaneous TID WC  . isosorbide mononitrate  60 mg Oral Daily  . lisinopril  20 mg Oral BID  . metoprolol succinate  100 mg Oral Daily  . ranolazine  500 mg Oral BID  . [START ON 10/20/2018] senna  2 tablet Oral Q M,W,F-2000  . sodium chloride flush  3 mL Intravenous Q12H    Continuous Infusions: . sodium chloride       LOS: 0 days     Briant Cedar, MD [facility]  If 7PM-7AM, please contact night-coverage [email or web address] 10/19/2018, 12:42 PM

## 2018-10-19 NOTE — H&P (Signed)
Charlene Greene ZOX:[id number] DOB: [birth year] DOA: 10/18/2018     PCP: Almetta Lovely, [facility]   Outpatient Specialists:   CARDS:  Dr. Willeen Cass [facility]) Dr. Herbie Baltimore and Dr. Johney Frame in the hospital.    Patient arrived to ER on 10/18/18 at 1949  Patient coming from:   From facility [place]  at [facility].  Chief Complaint:  Chief Complaint  Patient presents with  . Shortness of Breath  . Cough    HPI: Charlene Greene is a 84 y.o. female with medical history significant of  non-ST elevation MI, with known history of coronary artery disease, chronic diastolic heart failure, hypertension, sick sinus syndrome status post Medtronic dual-chamber pacemaker, hyperlipidemia,  CVAs with residual Left sided weakness, DM2  Chronic respiratory failure on O2 3 L at baseline  Presented with   a history of progressive shortness of breath. Oxygen has been increased to 4 L she has been having worse lower extremity edema left greater than right no prior history of asthma or COPD no associated fevers or chills she does have mild cough and has been having progressive fatigue for the past few days and worsening shortness of breath with minimal exertion.  She resides in assisted living and complying to home medications  Last admit in  September 2019 for NSTEMI and [facility]  cardiology consulted felt that  elevated troponin likely due to mild ischemia in the setting of diastolic dysfunctionCHFexacerbation and hypertension, occasions were continued she was discharged on Lasix 80 mg a day Prior to this admission for CVA in 2017, again 02/2018, and admission 05/2018 for cap/chf.  Continue November she was seen in the emergency department for fall was able to be discharged back to assisted living Regarding pertinent Chronic problems: hx of CAD on    aspirin 81 mg daily Plavix 75 mg daily metoprolol 100 mg daily and Ranexa 500 mg twice daily.  The patient is intolerant of statins. Hx of HTN on antihypertensive  medications to include amlodipine lisinopril and Imdur Recent ECHO September 2019 revealed normal LV systolic function with grade 1 diastolic dysfunction.   DM2-good control, A1c 6.9 While in ER: Was noted to be hypertensive with blood pressures up to 190/96 satting 97% on 4 L afebrile BNP 462 The following Work up has been ordered so far:  Orders Placed This Encounter  Procedures  . DG Chest 2 View  . CT Angio Chest PE W and/or Wo Contrast  . CBC with Differential/Platelet  . Comprehensive metabolic panel  . Troponin I - ONCE - STAT  . Brain natriuretic peptide  . Consult to hospitalist  . EKG 12-Lead      Following Medications were ordered in ER: Medications  furosemide (LASIX) injection 40 mg (has no administration in time range)  sodium chloride (PF) 0.9 % injection (has no administration in time range)  iopamidol (ISOVUE-370) 76 % injection (has no administration in time range)  iopamidol (ISOVUE-370) 76 % injection 100 mL (100 mLs Intravenous Contrast Given 10/18/18 2359)    Significant initial  Findings: Abnormal Labs Reviewed  COMPREHENSIVE METABOLIC PANEL - Abnormal; Notable for the following components:      Result Value   Glucose, Bld 145 (*)    All other components within normal limits  BRAIN NATRIURETIC PEPTIDE - Abnormal; Notable for the following components:   B Natriuretic Peptide 462.4 (*)    All other components within normal limits     Lactic Acid, Venous No results found for: LATICACIDVEN  Na 143 K  3.9  Cr    stable,    Lab Results  Component Value Date   CREATININE 0.74 10/18/2018   CREATININE 0.89 08/07/2018   CREATININE 0.95 08/06/2018      WBC  5.6  HG/HCT  stable,       Component Value Date/Time   HGB 14.6 10/18/2018 2129   HCT 45.0 10/18/2018 2129     Troponin 0.03   BNP (last 3 results) Recent Labs    05/10/18 0502 08/02/18 0000 10/18/18 2129  BNP 264.0* 900.5* 462.4*    ProBNP (last 3 results) No results for  input(s): PROBNP in the last 8760 hours.   UA not ordered      CXR -  progression of bibasilar collapse/consolidation, vascular conjestion  CTchest - bilateral plural effusions, anectasis cannot rule out Pneumonia  ECG:  Personally reviewed by me showing: HR : * Rhythm: *NSR, Sinus tachycardia * A.fib. W RVR, RBBB, LBBB, Paced Ischemic changes*nonspecific changes, no evidence of ischemic changes QTC*    ED Triage Vitals  Enc Vitals Group     BP 10/18/18 2003 (!) 185/63     Pulse Rate 10/18/18 2003 65     Resp 10/18/18 2003 18     Temp 10/18/18 2003 97.9 F (36.6 C)     Temp Source 10/18/18 2003 Oral     SpO2 10/18/18 2003 97 %     Weight --      Height --      Head Circumference --      Peak Flow --      Pain Score 10/18/18 2022 0     Pain Loc --      Pain Edu? --      Excl. in GC? --   TMAX(24)@       Latest  Blood pressure (!) 190/96, pulse 65, temperature 97.9 F (36.6 C), temperature source Oral, resp. rate 18, SpO2 95 %.   Hospitalist was called for admission for acute on chronic respiratory failure active due to acute on chronic diastolic CHF exacerbation setting of accelerated hypertension   Review of Systems:    Pertinent positives include:  Fatigue  shortness of breath at rest.   dyspnea on exertion Bilateral lower extremity swelling   Constitutional:  No weight loss, night sweats, Fevers, chills,, weight loss  HEENT:  No headaches, Difficulty swallowing,Tooth/dental problems,Sore throat,  No sneezing, itching, ear ache, nasal congestion, post nasal drip,  Cardio-vascular:  No chest pain, Orthopnea, PND, anasarca, dizziness, palpitations.no  GI:  No heartburn, indigestion, abdominal pain, nausea, vomiting, diarrhea, change in bowel habits, loss of appetite, melena, blood in stool, hematemesis Resp:  no, No excess mucus, no productive cough, No non-productive cough, No coughing up of blood.No change in color of mucus.No wheezing. Skin:  no rash or  lesions. No jaundice GU:  no dysuria, change in color of urine, no urgency or frequency. No straining to urinate.  No flank pain.   Musculoskeletal:  No joint pain or no joint swelling. No decreased range of motion. No back pain.  Psych:  No change in mood or affect. No depression or anxiety. No memory loss.  Neuro: no localizing neurological complaints, no tingling, no weakness, no double vision, no gait abnormality, no slurred speech, no confusion  All systems reviewed and apart from HOPI all are negative  Past Medical History:   Past Medical History:  Diagnosis Date  . CAD (coronary artery disease)   . Diabetes mellitus without complication (HCC)   .  Hyperlipidemia   . Hypertension   . Pacemaker   . Renal artery stenosis (HCC)    s/p bilateral stents  . Sick sinus syndrome (HCC)   . Stroke [facility])    2 previous cva's      Past Surgical History:  Procedure Laterality Date  . PACEMAKER PLACEMENT  05/15/2014   MDT Advisa MRI implanted at [facility] for sick sinus syndrome    Social History:  Ambulatory    wheelchair bound,      reports that she has never smoked. She has never used smokeless tobacco. She reports that she does not drink alcohol or use drugs.     Family History:   Family History  Problem Relation Age of Onset  . Lung cancer Sister   . Heart failure Sister   . Heart disease Sister   . Vision loss Sister   . Mental illness Sister   . Hypertension Son   . Vision loss Son   . Vision loss Mother   . Vision loss Father   . Depression Father   . Diabetes Paternal Aunt   . Diabetes Maternal Aunt   . CAD Neg Hx   . Stroke Neg Hx     Allergies: Allergies  Allergen Reactions  . Statins Other (See Comments)    Muscle weakness/pain  . Other Other (See Comments)    Reductase Inhibitors- "Allergic," per MAR/reaction not recalled by the patient "Dye"- "Allergic," per MAR/reaction not recalled by the patient  . Amlodipine Other (See Comments)     Unknown   . Celecoxib Nausea Only and Other (See Comments)  . Codeine Other (See Comments)    "Allergic," per MAR/reaction not recalled by the patient  . Naproxen Other (See Comments)    Unknown   . Piroxicam Other (See Comments)    "Allergic," per MAR/reaction not recalled by the patient     Prior to Admission medications   Medication Sig Start Date End Date Taking? Authorizing Provider  acetaminophen (TYLENOL) 500 MG tablet Take 500 mg by mouth 2 (two) times daily.    Yes [provider]  amLODipine (NORVASC) 10 MG tablet Take 1 tablet (10 mg total) by mouth 2 (two) times daily. 10mg  in the am and 5mg  in the pm Patient taking differently: Take 10 mg by mouth daily.  08/18/18  Yes Jodelle Gross, NP  amLODipine (NORVASC) 5 MG tablet Take 5 mg by mouth at bedtime.   Yes [provider]  aspirin 81 MG chewable tablet Chew 1 tablet (81 mg total) by mouth daily. With Food 08/08/18  Yes Emokpae, Courage, MD  cholecalciferol (VITAMIN D3) 25 MCG (1000 UT) tablet Take 1,000 Units by mouth daily.   Yes [provider]  clopidogrel (PLAVIX) 75 MG tablet Take 1 tablet (75 mg total) by mouth daily. 03/13/18  Yes Angiulli, Mcarthur Rossetti, PA-C  dextromethorphan-guaiFENesin (MUCINEX DM) 30-600 MG 12hr tablet Take 1 tablet by mouth 2 (two) times daily. 05/13/18  Yes Roberto Scales D, MD  feeding supplement, ENSURE ENLIVE, (ENSURE ENLIVE) LIQD Take 237 mLs by mouth 2 (two) times daily between meals. 05/14/18  Yes Roberto Scales D, MD  furosemide (LASIX) 80 MG tablet Take 1 tablet (80 mg total) by mouth daily. 08/07/18  Yes Shon Hale, MD  glimepiride (AMARYL) 1 MG tablet Take 1 tablet (1 mg total) by mouth daily with breakfast. 03/13/18  Yes Angiulli, Mcarthur Rossetti, PA-C  isosorbide mononitrate (IMDUR) 60 MG 24 hr tablet Take 1 tablet (60 mg total)  by mouth daily. 10/11/16  Yes Angiulli, Mcarthur Rossettianiel J, PA-C  lisinopril (PRINIVIL,ZESTRIL) 20 MG tablet Take 1 tablet (20 mg total) by mouth 2  (two) times daily. 03/13/18  Yes Angiulli, Mcarthur Rossettianiel J, PA-C  Magnesium 250 MG TABS Take 250 mg by mouth at bedtime.   Yes [provider]  metoprolol succinate (TOPROL-XL) 100 MG 24 hr tablet Take 1 tablet (100 mg total) by mouth daily. 03/13/18  Yes Angiulli, Mcarthur Rossettianiel J, PA-C  Multiple Vitamin (MULTIVITAMIN WITH MINERALS) TABS tablet Take 1 tablet by mouth daily. 05/14/18  Yes Roberto ScalesNettey, Shayla D, MD  ranolazine (RANEXA) 500 MG 12 hr tablet Take 1 tablet (500 mg total) by mouth 2 (two) times daily. 10/11/16  Yes Angiulli, Mcarthur Rossettianiel J, PA-C  senna (SENOKOT) 8.6 MG TABS tablet Take 2 tablets (17.2 mg total) by mouth at bedtime. Patient taking differently: Take 2 tablets by mouth every Monday, Wednesday, and Friday at 8 PM.  08/07/18  Yes Emokpae, Courage, MD  acetaminophen (TYLENOL) 500 MG tablet Take 500 mg by mouth every 12 (twelve) hours as needed for mild pain.    [provider]  nitroGLYCERIN (NITROSTAT) 0.4 MG SL tablet Place 0.4 mg under the tongue every 5 (five) minutes x 3 doses as needed for chest pain (and call 9-1-1 and MD, if no relief).     [provider]   Physical Exam: Blood pressure (!) 190/96, pulse 65, temperature 97.9 F (36.6 C), temperature source Oral, resp. rate 18, SpO2 95 %. 1. General:  in No Acute distress   Chronically ill  -appearing 2. Psychological: Alert and   Oriented 3. Head/ENT:   Moist  Mucous Membranes                          Head Non traumatic, neck supple                         Poor Dentition 4. SKIN: normal  Skin turgor,  Skin clean Dry and intact no rash 5. Heart: Regular rate and rhythm no Murmur, no Rub or gallop 6. Lungs:  no wheezes some  crackles  , diminished at the bases 7. Abdomen: Soft, non-tender, Non distended   obese  bowel sounds present 8. Lower extremities: no clubbing, cyanosis,  trace edema 9. Neurologically Grossly intact, strength diminished on the left 10. MSK: Normal range of motion   LABS:     Recent Labs   Lab 10/18/18 2129  WBC 5.6  NEUTROABS 4.1  HGB 14.6  HCT 45.0  MCV 93.0  PLT 226   Basic Metabolic Panel: Recent Labs  Lab 10/18/18 2129  NA 143  K 3.9  CL 103  CO2 30  GLUCOSE 145*  BUN 16  CREATININE 0.74  CALCIUM 9.0      Recent Labs  Lab 10/18/18 2129  AST 18  ALT 22  ALKPHOS 88  BILITOT 1.0  PROT 7.1  ALBUMIN 4.1   No results for input(s): LIPASE, AMYLASE in the last 168 hours. No results for input(s): AMMONIA in the last 168 hours.    HbA1C: No results for input(s): HGBA1C in the last 72 hours. CBG: No results for input(s): GLUCAP in the last 168 hours.    Urine analysis:    Component Value Date/Time   COLORURINE YELLOW 02/22/2018 2000   APPEARANCEUR CLEAR 02/22/2018 2000   LABSPEC 1.013 02/22/2018 2000   PHURINE 6.0 02/22/2018 2000   GLUCOSEU  50 (A) 02/22/2018 2000   HGBUR NEGATIVE 02/22/2018 2000   BILIRUBINUR NEGATIVE 02/22/2018 2000   KETONESUR NEGATIVE 02/22/2018 2000   PROTEINUR NEGATIVE 02/22/2018 2000   NITRITE NEGATIVE 02/22/2018 2000   LEUKOCYTESUR MODERATE (A) 02/22/2018 2000      Cultures:    Component Value Date/Time   SDES URINE, CLEAN CATCH 02/22/2018 2000   SPECREQUEST NONE 02/22/2018 2000   CULT  02/22/2018 2000    NO GROWTH Performed at [facility], [address], [place], [place] [address]    REPTSTATUS 02/24/2018 FINAL 02/22/2018 2000     Radiological Exams on Admission: Dg Chest 2 View  Result Date: 10/18/2018 CLINICAL DATA:  Shortness of breath for 3 days. EXAM: CHEST - 2 VIEW COMPARISON:  08/04/2018 FINDINGS: 2152 hours. Low lung volumes. Bibasilar collapse/consolidation appears progressed in the interval. Associated small bilateral pleural effusions. There is pulmonary vascular congestion without overt pulmonary edema. The cardio pericardial silhouette is enlarged. The visualized bony structures of the thorax are intact. Degenerative changes are noted in both shoulders. Left-sided permanent pacemaker  again noted. Telemetry leads overlie the chest. IMPRESSION: Cardiomegaly with progression of bibasilar collapse/consolidation. Small bilateral pleural effusions. Vascular congestion. Electronically Signed   By: Kennith Center M.D.   On: 10/18/2018 22:21    Chart has been reviewed    Assessment/Plan   84 y.o. female with medical history significant of  non-ST elevation MI, with known history of coronary artery disease, chronic diastolic heart failure, hypertension, sick sinus syndrome status post Medtronic dual-chamber pacemaker, hyperlipidemia,  CVAs with residual Left sided weakness, DM2  Chronic respiratory failure on O2 3 L at baseline Admitted for acute on chronic respiratory failure active due to acute on chronic diastolic CHF exacerbation setting of accelerated hypertension   Present on Admission: . Acute respiratory failure with hypoxia (HCC) -SPECT secondary to bilateral pleural effusion secondary to acute on chronic diastolic CHF although CT could not rule out pneumonia patient has no clinical presentation of pneumonia no fever no elevated white blood cell count.  Continue to observe so at this point will hold off on antibiotics.  Will diurese.  Once back to dry and pleural effusions have improved consider reimaging to evaluate for any underlying pathology . Acute on chronic diastolic congestive heart failure (HCC) -  - admit on telemetry,  cycle cardiac enzymes, Troponin <0.03   obtain serial ECG  to evaluate for ischemia as a cause of heart failure  monitor daily weight: There were no vitals filed for this visit. Last BNP BNP (last 3 results) Recent Labs    05/10/18 0502 08/02/18 0000 10/18/18 2129  BNP 264.0* 900.5* 462.4*       diurese with IV lasix and monitor orthostatics and creatinine to avoid over diuresis.   echogram has been done in the past 3 months we will hold off on ordering new one    ACE/ARBi ordered   cardiology  Consult if needed  . Hyperlipidemia -  -  Chronic, stable, continue home medications  . Sick sinus syndrome (HCC) -status post pacemaker continue to monitor . Type 2 diabetes mellitus with diabetic autonomic neuropathy, without long-term current use of insulin (HCC) -  - Order Sensitive   SSI    -  check TSH and HgA1C  - Hold by mouth medications      . Accelerated hypertension restart home medications avoid over aggressive management in this elderly female with labile hypertension  History of CVA with residual left-sided weakness chronic stable  Edema currently improved patient has chronic left-sided weakness which may explain slightly worsening left-sided edema currently resolved  Other plan as per orders.  DVT prophylaxis:  SCD    Code Status:    DNR/DNI  as per patient   I had personally discussed CODE STATUS with patient and family Family Communication:   Family  at  Bedside  plan of care was discussed with   Daughter in law, Son  Disposition Plan:   Back to current facility when stable                                            Would benefit from PT/OT eval prior to DC  Ordered                                     Nutrition    consulted                            social work          Education officer, community called: none   Admission status:   inpatient     Expect 2 midnight stay secondary to severity of patient's current illness including   hemodynamic instability despite optimal treatment ( hypertension  hypoxia)   Severe lab/radiological abnormalities including:  Bilateral moderate pleural effusion  and extensive comorbidities including:  DM2    CHF  CAD Chronic respiratory failure on home oxygen That are currently affecting medical management.  I expect  patient to be hospitalized for 2 midnights requiring inpatient medical care.  Patient is at high risk for adverse outcome (such as loss of life or disability) if not treated.  Indication for inpatient stay as follows:  Hemodynamic instability despite maximal medical  therapy,  Increase oxygen requirement from baseline despite medical management   Need for IV medications     Level of care    tele   24H            Jaquell Seddon 10/19/2018, 1:15 AM    [facility]  Pager [phone]   after 2 AM please page floor coverage PA If 7AM-7PM, please contact the day team taking care of the patient  [email or web address]  Password [email or web address]

## 2018-10-19 NOTE — ED Notes (Signed)
ED TO INPATIENT HANDOFF REPORT  Name/Age/Gender Charlene Greene 84 y.o. female  Code Status Code Status History    Date Active Date Inactive Code Status Order ID Comments User Context   08/02/2018 0217 08/07/2018 2341 DNR [id number]  Kathrynn Running, MD ED   05/08/2018 0145 05/14/2018 1336 DNR [id number]  Eduard Clos, MD Inpatient   02/28/2018 0726 03/13/2018 1513 DNR [id number]  Charlton Amor, PA-C Inpatient   02/26/2018 1852 02/28/2018 0726 Full Code [id number]  Charlton Amor, PA-C Inpatient   02/26/2018 1852 02/26/2018 1852 DNR [id number]  Charlton Amor, PA-C Inpatient   02/22/2018 2241 02/26/2018 1848 DNR [id number]  Therisa Doyne, MD Inpatient   09/26/2016 1955 10/11/2016 1401 Full Code [id number]  Charlton Amor, PA-C Inpatient   09/23/2016 1641 09/26/2016 1814 DNR [id number]  Rhetta Mura, MD Inpatient    Questions for Most Recent Historical Code Status (Order [id number])    Question Answer Comment   In the event of cardiac or respiratory ARREST Do not call a "code blue"    In the event of cardiac or respiratory ARREST Do not perform Intubation, CPR, defibrillation or ACLS    In the event of cardiac or respiratory ARREST Use medication by any route, position, wound care, and other measures to relive pain and suffering. May use oxygen, suction and manual treatment of airway obstruction as needed for comfort.         Advance Directive Documentation     Most Recent Value  Type of Advance Directive  Healthcare Power of Attorney, Living will, Out of facility DNR (pink MOST or yellow form)  Pre-existing out of facility DNR order (yellow form or pink MOST form)  -  "MOST" Form in Place?  -      Home/SNF/Other Skilled nursing facility  Chief Complaint SOB;leg swelling  Level of Care/Admitting Diagnosis ED Disposition    ED Disposition Condition Comment   Admit  Hospital Area[facility] [[id number]]  Level of Care: Telemetry [5]  Admit  to tele based on following criteria: Monitor for Ischemic changes  Diagnosis: Acute on chronic diastolic CHF (congestive heart failure) [facility]) [[id number]]  Admitting Physician: Therisa Doyne [[id number]]  Attending Physician: Therisa Doyne [[id number]]  Estimated length of stay: past midnight tomorrow  Certification:: I certify this patient will need inpatient services for at least 2 midnights  PT Class (Do Not Modify): Inpatient [101]  PT Acc Code (Do Not Modify): Private [1]       Medical History Past Medical History:  Diagnosis Date  . CAD (coronary artery disease)   . Diabetes mellitus without complication (HCC)   . Hyperlipidemia   . Hypertension   . Pacemaker   . Renal artery stenosis (HCC)    s/p bilateral stents  . Sick sinus syndrome (HCC)   . Stroke [facility])    2 previous cva's    Allergies Allergies  Allergen Reactions  . Statins Other (See Comments)    Muscle weakness/pain  . Other Other (See Comments)    Reductase Inhibitors- "Allergic," per MAR/reaction not recalled by the patient "Dye"- "Allergic," per MAR/reaction not recalled by the patient  . Amlodipine Other (See Comments)    Unknown   . Celecoxib Nausea Only and Other (See Comments)  . Codeine Other (See Comments)    "Allergic," per MAR/reaction not recalled by the patient  . Naproxen Other (See Comments)    Unknown   . Piroxicam Other (See Comments)    "Allergic," per MAR/reaction not recalled  by the patient    IV Location/Drains/Wounds Patient Lines/Drains/Airways Status   Active Line/Drains/Airways    Name:   Placement date:   Placement time:   Site:   Days:   Peripheral IV 10/18/18 Left Hand   10/18/18    2223    Hand   1   Peripheral IV 10/18/18 Right Antecubital   10/18/18    2343    Antecubital   1   Pressure Injury 05/08/18 Stage I -  Intact skin with non-blanchable redness of a localized area usually over a bony prominence. sl redness to sacral area   05/08/18    0000     164   Pressure  Injury 08/05/18 Stage I -  Intact skin with non-blanchable redness of a localized area usually over a bony prominence. redden   08/05/18    2000     75          Labs/Imaging Results for orders placed or performed during the hospital encounter of 10/18/18 (from the past 48 hour(s))  CBC with Differential/Platelet     Status: None   Collection Time: 10/18/18  9:29 PM  Result Value Ref Range   WBC 5.6 4.0 - 10.5 K/uL   RBC 4.84 3.87 - 5.11 MIL/uL   Hemoglobin 14.6 12.0 - 15.0 g/dL   HCT 16.1 09.6 - 04.5 %   MCV 93.0 80.0 - 100.0 fL   MCH 30.2 26.0 - 34.0 pg   MCHC 32.4 30.0 - 36.0 g/dL   RDW 40.9 81.1 - 91.4 %   Platelets 226 150 - 400 K/uL   nRBC 0.0 0.0 - 0.2 %   Neutrophils Relative % 71 %   Neutro Abs 4.1 1.7 - 7.7 K/uL   Lymphocytes Relative 19 %   Lymphs Abs 1.1 0.7 - 4.0 K/uL   Monocytes Relative 6 %   Monocytes Absolute 0.3 0.1 - 1.0 K/uL   Eosinophils Relative 2 %   Eosinophils Absolute 0.1 0.0 - 0.5 K/uL   Basophils Relative 1 %   Basophils Absolute 0.0 0.0 - 0.1 K/uL   Immature Granulocytes 1 %   Abs Immature Granulocytes 0.04 0.00 - 0.07 K/uL    Comment: Performed at [facility], [address] [place], [place] [address]  Comprehensive metabolic panel     Status: Abnormal   Collection Time: 10/18/18  9:29 PM  Result Value Ref Range   Sodium 143 135 - 145 mmol/L   Potassium 3.9 3.5 - 5.1 mmol/L   Chloride 103 98 - 111 mmol/L   CO2 30 22 - 32 mmol/L   Glucose, Bld 145 (H) 70 - 99 mg/dL   BUN 16 8 - 23 mg/dL   Creatinine, Ser 6.21 0.44 - 1.00 mg/dL   Calcium 9.0 8.9 - 30.8 mg/dL   Total Protein 7.1 6.5 - 8.1 g/dL   Albumin 4.1 3.5 - 5.0 g/dL   AST 18 15 - 41 U/L   ALT 22 0 - 44 U/L   Alkaline Phosphatase 88 38 - 126 U/L   Total Bilirubin 1.0 0.3 - 1.2 mg/dL   GFR calc non Af Amer >60 >60 mL/min   GFR calc Af Amer >60 >60 mL/min   Anion gap 10 5 - 15    Comment: Performed at [facility], [address],  [place], [place] [address]  Troponin I - ONCE - STAT     Status: None   Collection Time: 10/18/18  9:29 PM  Result  Value Ref Range   Troponin I <0.03 <0.03 ng/mL    Comment: Performed at [facility], [address] [place], [place] [address]  Brain natriuretic peptide     Status: Abnormal   Collection Time: 10/18/18  9:29 PM  Result Value Ref Range   B Natriuretic Peptide 462.4 (H) 0.0 - 100.0 pg/mL    Comment: Performed at [facility], [address] [place], [place] [address]   Dg Chest 2 View  Result Date: 10/18/2018 CLINICAL DATA:  Shortness of breath for 3 days. EXAM: CHEST - 2 VIEW COMPARISON:  08/04/2018 FINDINGS: 2152 hours. Low lung volumes. Bibasilar collapse/consolidation appears progressed in the interval. Associated small bilateral pleural effusions. There is pulmonary vascular congestion without overt pulmonary edema. The cardio pericardial silhouette is enlarged. The visualized bony structures of the thorax are intact. Degenerative changes are noted in both shoulders. Left-sided permanent pacemaker again noted. Telemetry leads overlie the chest. IMPRESSION: Cardiomegaly with progression of bibasilar collapse/consolidation. Small bilateral pleural effusions. Vascular congestion. Electronically Signed   By: Kennith Center M.D.   On: 10/18/2018 22:21   Ct Angio Chest Pe W And/or Wo Contrast  Result Date: 10/19/2018 CLINICAL DATA:  84 year old female with shortness of breath. Chest pain. EXAM: CT ANGIOGRAPHY CHEST WITH CONTRAST TECHNIQUE: Multidetector CT imaging of the chest was performed using the standard protocol during bolus administration of intravenous contrast. Multiplanar CT image reconstructions and MIPs were obtained to evaluate the vascular anatomy. CONTRAST:  ISOVUE-370 IOPAMIDOL (ISOVUE-370) INJECTION 76% COMPARISON:  Chest radiograph dated 10/18/2018 FINDINGS: Evaluation is limited due to streak artifact caused by patient's arms as  well as metallic pacemaker device. Cardiovascular: There is mild cardiomegaly. Coronary vascular calcification and cardiac pacemaker wire noted. No pericardial effusion. There is moderate atherosclerotic calcification of the thoracic aorta. There is no CT evidence of pulmonary embolism. Mediastinum/Nodes: No hilar or mediastinal adenopathy. Esophagus is grossly unremarkable. No mediastinal fluid collection. Lungs/Pleura: Moderate bilateral pleural effusions with associated partial compressive atelectasis of the lower lobes. Pneumonia is not excluded. Clinical correlation is recommended. Patchy airspace ground-glass opacity in the upper lobes with interlobular septal prominence likely a degree of edema. There is no pneumothorax. 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 The central airways remain patent. Upper Abdomen: There is retrograde flow of contrast from the right atrium into the IVC consistent with right heart dysfunction. The visualized upper abdomen is otherwise unremarkable. Musculoskeletal: Osteopenia with degenerative changes of the spine. No acute osseous pathology. Review of the MIP images confirms the above findings. IMPRESSION: 1. No CT evidence of pulmonary embolism. 2. Cardiomegaly with findings of CHF. Moderate bilateral pleural effusions with associated partial compressive atelectasis of the lower lobes. Pneumonia is not excluded. Clinical correlation is recommended. Electronically Signed   By: Elgie Collard M.D.   On: 10/19/2018 00:27   None  Pending Labs Unresulted Labs (From admission, onward)    Start     Ordered   10/19/18 0026  Troponin I - Now Then Q6H  Now then every 6 hours,   R     10/19/18 0025   Signed and Held  Hemoglobin A1c  Tomorrow morning,   R    Comments:  To assess prior glycemic control    Signed and Held   Signed and Held  Prealbumin  Tomorrow morning,   R     Signed and Held          Vitals/Pain Today's Vitals   10/18/18 2022 10/18/18 2130 10/18/18 2200 10/18/18  2307  BP:  (!) 175/101 (!) 190/96   Pulse:  66 65   Resp:  20 18   Temp:      TempSrc:      SpO2:  95% 95%   PainSc: 0-No pain   0-No pain    Isolation Precautions No active isolations  Medications Medications  furosemide (LASIX) injection 40 mg (has no administration in time range)  sodium chloride (PF) 0.9 % injection (has no administration in time range)  iopamidol (ISOVUE-370) 76 % injection (has no administration in time range)  iopamidol (ISOVUE-370) 76 % injection 100 mL (100 mLs Intravenous Contrast Given 10/18/18 2359)    Mobility non-ambulatory

## 2018-10-20 LAB — BASIC METABOLIC PANEL
Anion gap: 9 (ref 5–15)
BUN: 18 mg/dL (ref 8–23)
CO2: 36 mmol/L — ABNORMAL HIGH (ref 22–32)
Calcium: 8.5 mg/dL — ABNORMAL LOW (ref 8.9–10.3)
Chloride: 97 mmol/L — ABNORMAL LOW (ref 98–111)
Creatinine, Ser: 0.88 mg/dL (ref 0.44–1.00)
GFR calc Af Amer: >60 mL/min (ref >60)
GFR calc non Af Amer: 56 mL/min — ABNORMAL LOW (ref >60)
Glucose, Bld: 128 mg/dL — ABNORMAL HIGH (ref 70–99)
Potassium: 3.8 mmol/L (ref 3.5–5.1)
SODIUM: 142 mmol/L (ref 135–145)

## 2018-10-20 LAB — GLUCOSE, CAPILLARY
GLUCOSE-CAPILLARY: 159 mg/dL — AB (ref 70–99)
Glucose-Capillary: 149 mg/dL — ABNORMAL HIGH (ref 70–99)
Glucose-Capillary: 135 mg/dL — ABNORMAL HIGH (ref 70–99)
Glucose-Capillary: 153 mg/dL — ABNORMAL HIGH (ref 70–99)

## 2018-10-20 MED ORDER — ENSURE ENLIVE PO LIQD
237.0000 mL | Freq: Two times a day (BID) | ORAL | Status: DC
  Administered 2018-10-22: 237 mL via ORAL

## 2018-10-20 MED ORDER — HYDRALAZINE HCL 20 MG/ML IJ SOLN
10.0000 mg | Freq: Four times a day (QID) | INTRAMUSCULAR | Status: DC | PRN
  Administered 2018-10-20: 10 mg via INTRAVENOUS
  Filled 2018-10-20: qty 1

## 2018-10-20 NOTE — Clinical Social Work Note (Signed)
Clinical Social Work Assessment  Patient Details  Name: Charlene Greene MRN: [id number] Date of Birth: Dec 24, 1921  Date of referral:  10/20/18               Reason for consult:  Discharge Planning                Permission sought to share information with:  Family Supports Permission granted to share information::  Yes, Verbal Permission Granted  Name::     Lyda Jester  Agency::  [facility]  Relationship::  daughter  Contact Information:  [phone]  Housing/Transportation Living arrangements for the past 2 months:  [place] of Information:  Patient Patient Interpreter Needed:  None Criminal Activity/Legal Involvement Pertinent to Current Situation/Hospitalization:  No - Comment as needed Significant Relationships:  Adult Children, Community Support Lives with:  Self Do you feel safe going back to the place where you live?  Yes Need for family participation in patient care:  Yes (Comment)  Care giving concerns:  No family at bedside. Patient is from [facility] and has been at the facility for 1 year and half. Patient stated he has support from facility and her adult children   Social Worker assessment / plan:  CSW met patient at bedside to discuss discharge needs. patient stated she would be agreeable to go back to ALF once medically cleared. At this time patient has not been assessed by PT/OT for discharge needs. CSW will follow to see what level of care PT recommends for patient   Employment status:  Retired Insurance underwriter information:  Medicare PT Recommendations:  Not assessed at this time Information / Referral to community resources:  Other (Comment Required)  Patient/Family's Response to care:  Patient very pleasant and appreciate the care she has received  Patient/Family's Understanding of and Emotional Response to Diagnosis, Current Treatment, and Prognosis:  CSW will follow patient for discharge needs  Emotional  Assessment Appearance:  Appears stated age Attitude/Demeanor/Rapport:  Engaged Affect (typically observed):  Accepting Orientation:  Oriented to Self, Oriented to Place, Oriented to  Time, Oriented to Situation Alcohol / Substance use:  Not Applicable Psych involvement (Current and /or in the community):  No (Comment)  Discharge Needs  Concerns to be addressed:  Care Coordination Readmission within the last 30 days:  No Current discharge risk:  Dependent with Mobility Barriers to Discharge:  Continued Medical Work up   [employer], LCSW 10/20/2018, 12:00 PM

## 2018-10-20 NOTE — Progress Notes (Signed)
PROGRESS NOTE  Glenette Bookwalter ZOX:[id number] DOB: 27-Apr-1922 DOA: 10/18/2018 PCP: [facility]  HPI/Recap of past 24 hours: HPI from Dr Jeanne Ivan is a 84 y.o. female with medical history significant for CAD, chronic diastolic heart failure, hypertension, sick sinus syndrome status post Medtronic dual-chamber pacemaker, hyperlipidemia, CVAs with residualLeft sided weakness, DM2, chronic respiratory failure on O2 3L at baseline, presents to the ED with progressive SOB, dyspnea, requiring increasing O2, BLE edema. Denies any fever/chills or hx of COPD. Pt resides in assisted living and complying to home medications. Last admit in September 2019 for NSTEMI and Vtach, cardiology consulted felt that  elevated troponin likely due to mild ischemia in the setting of diastolic dysfunction HF, medications were continued and was discharged on Lasix 80 mg a day. Pt admitted for further management.   Today, pt reported her breathing has slightly improved, denies any new complaints.  Assessment/Plan: Active Problems:   Accelerated hypertension   Diabetes mellitus (HCC)   Hyperlipidemia   Sick sinus syndrome (HCC)   Type 2 diabetes mellitus with diabetic autonomic neuropathy, without long-term current use of insulin (HCC)   Acute on chronic diastolic congestive heart failure (HCC)   Acute respiratory failure with hypoxia (HCC)   Oxygen dependent   Acute on chronic diastolic CHF (congestive heart failure) (HCC)  Acute on chronic diastolic HF BNP 462, about 900, 2 months ago Troponin negative EKG showed paced rhythm, prolonged QTC Chest x-ray with cardiomegaly with progression of by basilar collapse Vs consolidation, with vascular congestion Echo done on 02/2018 showed EF of 65 to 70%, grade 1 diastolic dysfunction Continue IV Lasix Strict I's and O's, daily weights  Acute on chronic hypoxic respiratory failure Oxygen dependent, on home O2 3 L Likely due to above Continue  supplemental oxygen Management as above We will hold off on starting any antibiotics, as patient has not developed any signs of pneumonia  Hypokalemia Replace PRN  Type 2 diabetes mellitus Last A1c, 6.4, controlled Continue SSI, Accu-Cheks, hypoglycemic protocol Held home glimepiride  Hypertension Stable Continue amlodipine, imdur, lisinopril  History of CAD Currently denies any chest pain Continue Plavix, imdur, lisinopril, metoprolol, ranolazine, ASA  History of CVA Stable residual left-sided weakness Continue ASA, Plavix  Sick sinus syndrome Status post pacemaker        Malnutrition Type:  Nutrition Problem: Increased nutrient needs Etiology: acute illness(hosptalization)   Malnutrition Characteristics:  Signs/Symptoms: estimated needs   Nutrition Interventions:  Interventions: Ensure Enlive (each supplement provides 350kcal and 20 grams of protein)    Estimated body mass index is 28.12 kg/m as calculated from the following:   Height as of this encounter: 5\' 4"  (1.626 m).   Weight as of this encounter: 74.3 kg.     Code Status: DNR  Family Communication: None at bedside  Disposition Plan: Back to ALF, likely 10/21/18 if she is able to maintain sats on her baseline 3L of O2   Consultants:  None  Procedures:  None  Antimicrobials:  None  DVT prophylaxis: Lovenox   Objective: Vitals:   10/20/18 0504 10/20/18 0843 10/20/18 0904 10/20/18 1323  BP:   140/66 140/61  Pulse:   62 61  Resp:   18 16  Temp:   98 F (36.7 C) 98.7 F (37.1 C)  TempSrc:   Oral Oral  SpO2:   98% 96%  Weight: 74.3 kg     Height:  5\' 4"  (1.626 m)      Intake/Output Summary (Last 24 hours) at  10/20/2018 1812 Last data filed at 10/20/2018 1044 Gross per 24 hour  Intake 360 ml  Output 1400 ml  Net -1040 ml   Filed Weights   10/20/18 0504  Weight: 74.3 kg    Exam:  General: NAD   Cardiovascular: S1, S2 present  Respiratory: CTAB  Abdomen:  Soft, nontender, nondistended, bowel sounds present  Musculoskeletal: Trace bilateral pedal edema noted  Skin: Scattered ecchymosis noted  Psychiatry: Normal mood   Data Reviewed: CBC: Recent Labs  Lab 10/18/18 2129 10/19/18 0615  WBC 5.6 5.4  NEUTROABS 4.1  --   HGB 14.6 14.0  HCT 45.0 43.3  MCV 93.0 91.2  PLT 226 226   Basic Metabolic Panel: Recent Labs  Lab 10/18/18 2129 10/19/18 0615 10/20/18 0542  NA 143 142 142  K 3.9 3.2* 3.8  CL 103 97* 97*  CO2 30 34* 36*  GLUCOSE 145* 149* 128*  BUN 16 14 18   CREATININE 0.74 0.67 0.88  CALCIUM 9.0 8.7* 8.5*  MG  --  1.9  --   PHOS  --  4.0  --    GFR: Estimated Creatinine Clearance: 38.6 mL/min (by C-G formula based on SCr of 0.88 mg/dL). Liver Function Tests: Recent Labs  Lab 10/18/18 2129 10/19/18 0615  AST 18 14*  ALT 22 19  ALKPHOS 88 75  BILITOT 1.0 0.7  PROT 7.1 6.4*  ALBUMIN 4.1 3.6   No results for input(s): LIPASE, AMYLASE in the last 168 hours. No results for input(s): AMMONIA in the last 168 hours. Coagulation Profile: No results for input(s): INR, PROTIME in the last 168 hours. Cardiac Enzymes: Recent Labs  Lab 10/18/18 2129 10/19/18 0211 10/19/18 1334  TROPONINI <0.03 <0.03 <0.03   BNP (last 3 results) No results for input(s): PROBNP in the last 8760 hours. HbA1C: Recent Labs    10/19/18 0615  HGBA1C 6.4*   CBG: Recent Labs  Lab 10/19/18 1618 10/19/18 2134 10/20/18 0741 10/20/18 1153 10/20/18 1614  GLUCAP 77 135* 159* 149* 135*   Lipid Profile: No results for input(s): CHOL, HDL, LDLCALC, TRIG, CHOLHDL, LDLDIRECT in the last 72 hours. Thyroid Function Tests: Recent Labs    10/19/18 0615  TSH 4.387   Anemia Panel: No results for input(s): VITAMINB12, FOLATE, FERRITIN, TIBC, IRON, RETICCTPCT in the last 72 hours. Urine analysis:    Component Value Date/Time   COLORURINE YELLOW 02/22/2018 2000   APPEARANCEUR CLEAR 02/22/2018 2000   LABSPEC 1.013 02/22/2018 2000    PHURINE 6.0 02/22/2018 2000   GLUCOSEU 50 (A) 02/22/2018 2000   HGBUR NEGATIVE 02/22/2018 2000   BILIRUBINUR NEGATIVE 02/22/2018 2000   KETONESUR NEGATIVE 02/22/2018 2000   PROTEINUR NEGATIVE 02/22/2018 2000   NITRITE NEGATIVE 02/22/2018 2000   LEUKOCYTESUR MODERATE (A) 02/22/2018 2000   Sepsis Labs: @LABRCNTIP (procalcitonin:4,lacticidven:4)  ) Recent Results (from the past 240 hour(s))  MRSA PCR Screening     Status: None   Collection Time: 10/19/18  6:18 PM  Result Value Ref Range Status   MRSA by PCR NEGATIVE NEGATIVE Final    Comment:        The GeneXpert MRSA Assay (FDA approved for NASAL specimens only), is one component of a comprehensive MRSA colonization surveillance program. It is not intended to diagnose MRSA infection nor to guide or monitor treatment for MRSA infections. Performed at [facility], [address] [place], [place] [address]       Studies: No results found.  Scheduled Meds: . amLODipine  10 mg Oral Daily  .  aspirin  81 mg Oral Daily  . clopidogrel  75 mg Oral Q breakfast  . enoxaparin (LOVENOX) injection  40 mg Subcutaneous Q24H  . feeding supplement (ENSURE ENLIVE)  237 mL Oral BID BM  . furosemide  40 mg Intravenous Q12H  . insulin aspart  0-5 Units Subcutaneous QHS  . insulin aspart  0-9 Units Subcutaneous TID WC  . isosorbide mononitrate  60 mg Oral Daily  . lisinopril  20 mg Oral BID  . mouth rinse  15 mL Mouth Rinse BID  . metoprolol succinate  100 mg Oral Daily  . ranolazine  500 mg Oral BID  . senna  2 tablet Oral Q M,W,F-2000  . sodium chloride flush  3 mL Intravenous Q12H    Continuous Infusions: . sodium chloride       LOS: 1 day     Briant Cedar, MD [facility]  If 7PM-7AM, please contact night-coverage [email or web address] 10/20/2018, 6:12 PM

## 2018-10-20 NOTE — Progress Notes (Signed)
Initial Nutrition Assessment  DOCUMENTATION CODES:   Not applicable  INTERVENTION:  - Will order Ensure Enlive BID, each supplement provides 350 kcal and 20 grams of protein. - Continue to encourage PO intakes.    NUTRITION DIAGNOSIS:   Increased nutrient needs related to acute illness(hosptalization) as evidenced by estimated needs.  GOAL:   Patient will meet greater than or equal to 90% of their needs  MONITOR:   PO intake, Supplement acceptance, Weight trends, Labs  REASON FOR ASSESSMENT:   Consult Assessment of nutrition requirement/status  ASSESSMENT:   84 y.o. female with medical history significant of NSTEMI, CAD, CHF, HTN, sick sinus syndrome s/p Medtronic dual-chamber pacemaker, hyperlipidemia, CVAs with residual L-sided weakness, type 2 DM, and chronic respiratory failure on 3L O2 at baseline. She presented to the ED with progressive SOB and O2 need increased to 4L, worsening BLE edema with L>R.  BMI indicates overweight status, appropriate for advanced age. Patient was last seen by an RD in June 2019 at which time it was reported that patient had a decreased appetite and intakes for ~2 weeks and was not consuming adequate amounts of fluid/day. Appetite has not fully recovered since that time and did decrease during hospitalization last month for NSTEMI.   Per review, patient has not been drinking much during this hospitalization. She had Ensure during June admission; will trial again. Per chart review, she consumed 75% of dinner on 12/8 (280 kcal, 13 grams of protein) and 100% of breakfast this AM (390 kal, 11 grams of protein).   Current weight is 164 lb and weight has been fairly stable since April 2019, but patient currently with severe edema to BLE. Will need to monitor weight trends closely.    Medications reviewed; 40 mg IV Lasix BID, sliding scale Novolog, 2 tablets Senokot/day on MWF. Labs reviewed; CBG: 159 and 149 mg/dL today, Cl: 97 mol/L, Ca: 8.5 mg/dL,  GFR: 56 mL/min.     NUTRITION - FOCUSED PHYSICAL EXAM:  Completed; no muscle and no fat wasting, severe edema to BLE.   Diet Order:   Diet Order            Diet Carb Modified Fluid consistency: Thin; Room service appropriate? Yes  Diet effective now              EDUCATION NEEDS:   No education needs have been identified at this time  Skin:  Skin Assessment: Reviewed RN Assessment  Last BM:  12/9  Height:   Ht Readings from Last 1 Encounters:  10/20/18 5\' 4"  (1.626 m)    Weight:   Wt Readings from Last 1 Encounters:  10/20/18 74.3 kg    Ideal Body Weight:  54.54 kg  BMI:  Body mass index is 28.12 kg/m.  Estimated Nutritional Needs:   Kcal:  1415-1635 kcal  Protein:  55-65 grams  Fluid:  >/= 1.6 L/day     Trenton GammonJessica Wilmon Conover, MS, RD, LDN, [facility] Inpatient Clinical Dietitian Pager # [phone] After hours/weekend pager # [phone]

## 2018-10-20 NOTE — Evaluation (Signed)
Physical Therapy Evaluation Patient Details Name: Charlene Greene MRN: [id number] DOB: 1921-12-03 Today's Date: 10/20/2018   History of Present Illness  Charlene Greene is a 84 y.o. female with medical history significant for CAD, chronic diastolic heart failure, hypertension, sick sinus syndrome status post Medtronic dual-chamber pacemaker, hyperlipidemia, CVAs with residual Left sided weakness, DM2, chronic respiratory failure on O2 3L at baseline, admitted with progressive SOB, dyspnea, BLE edema--dx CHF  Clinical Impression  Pt admitted with above diagnosis. Pt currently with functional limitations due to the deficits listed below (see PT Problem List). Pt will benefit from HHPT at ALF, anticipate pt will progress and be able to return to ALF setting * Pt will benefit from skilled PT to increase their independence and safety with mobility to allow discharge to the venue listed below.       Follow Up Recommendations Home health PT(at ALF)    Equipment Recommendations  None recommended by PT    Recommendations for Other Services       Precautions / Restrictions Precautions Precautions: Fall Precaution Comments: 4-5 falls last 2 weeks Restrictions Weight Bearing Restrictions: No      Mobility  Bed Mobility Overal bed mobility: Needs Assistance Bed Mobility: Supine to Sit     Supine to sit: Min assist     General bed mobility comments: assist to elevate trunk, incr time  Transfers Overall transfer level: Needs assistance Equipment used: Rolling walker (2 wheeled) Transfers: Sit to/from [employer] Sit to Stand: Min assist Stand pivot transfers: Min assist       General transfer comment: sit to stand x2, second trial with RW; assist for balance with stand pivot; incr time, cues for hand placement and LE position  Ambulation/Gait             General Gait Details: NT. pt felt "too weak"  Stairs            Wheelchair Mobility    Modified  Rankin (Stroke Patients Only)       Balance Overall balance assessment: Needs assistance;History of Falls(multiple recent falls) Sitting-balance support: Feet supported;No upper extremity supported Sitting balance-Leahy Scale: Fair       Standing balance-Leahy Scale: Poor Standing balance comment: reliant on external support and RW                             Pertinent Vitals/Pain Pain Assessment: No/denies pain    Home Living Family/patient expects to be discharged to:: Assisted living               Home Equipment: Walker - 4 wheels;Wheelchair - manual Additional Comments: [employer]    Prior Function Level of Independence: Needs assistance   Gait / Transfers Assistance Needed: amb short distances with staff assistance and rollator. wheelchair for longer distances  ADL's / Homemaking Assistance Needed: assistance for bathing, dressing and toileting  Comments: RW in room; Assistance for facility mobility in wheelchair; assistance with bathing, dressing, toileting.      Hand Dominance        Extremity/Trunk Assessment   Upper Extremity Assessment Upper Extremity Assessment: Generalized weakness    Lower Extremity Assessment Lower Extremity Assessment: Generalized weakness       Communication   Communication: No difficulties  Cognition Arousal/Alertness: Awake/alert Behavior During Therapy: WFL for tasks assessed/performed Overall Cognitive Status: Within Functional Limits for tasks assessed  General Comments      Exercises     Assessment/Plan    PT Assessment Patient needs continued PT services  PT Problem List Decreased strength;Decreased range of motion;Decreased activity tolerance;Decreased mobility;Decreased balance       PT Treatment Interventions DME instruction;Gait training;Functional mobility training;Therapeutic activities;Therapeutic exercise;Patient/family  education;Balance training    PT Goals (Current goals can be found in the Care Plan section)  Acute Rehab PT Goals PT Goal Formulation: With patient Time For Goal Achievement: 11/03/18 Potential to Achieve Goals: Good    Frequency Min 3X/week   Barriers to discharge        Co-evaluation               AM-PAC PT "6 Clicks" Mobility  Outcome Measure Help needed turning from your back to your side while in a flat bed without using bedrails?: A Little Help needed moving from lying on your back to sitting on the side of a flat bed without using bedrails?: A Little Help needed moving to and from a bed to a chair (including a wheelchair)?: A Little   Help needed to walk in hospital room?: A Lot Help needed climbing 3-5 steps with a railing? : A Lot 6 Click Score: 13    End of Session Equipment Utilized During Treatment: Gait belt Activity Tolerance: Patient tolerated treatment well;Patient limited by fatigue Patient left: in chair;with call bell/phone within reach;with chair alarm set   PT Visit Diagnosis: Unsteadiness on feet (R26.81)    Time: [id number] PT Time Calculation (min) (ACUTE ONLY): 18 min   Charges:   PT Evaluation $PT Eval Low Complexity: 1 Low          Drucilla Chaletara Robben Jagiello, PT  Pager: [phone] Acute Rehab Dept [facility]): [phone]   10/20/2018   [facility]ILLIAMS,Yessica Greene 10/20/2018, 1:18 PM

## 2018-10-21 LAB — GLUCOSE, CAPILLARY
Glucose-Capillary: 149 mg/dL — ABNORMAL HIGH (ref 70–99)
Glucose-Capillary: 254 mg/dL — ABNORMAL HIGH (ref 70–99)
Glucose-Capillary: 121 mg/dL — ABNORMAL HIGH (ref 70–99)
Glucose-Capillary: 131 mg/dL — ABNORMAL HIGH (ref 70–99)

## 2018-10-21 LAB — CBC WITH DIFFERENTIAL/PLATELET
Abs Immature Granulocytes: 0.04 10*3/uL (ref 0.00–0.07)
Basophils Absolute: 0.1 10*3/uL (ref 0.0–0.1)
Basophils Relative: 1 %
Eosinophils Absolute: 0.2 10*3/uL (ref 0.0–0.5)
Eosinophils Relative: 2 %
HCT: 43.2 % (ref 36.0–46.0)
Hemoglobin: 13.7 g/dL (ref 12.0–15.0)
Immature Granulocytes: 1 %
Lymphocytes Relative: 18 %
Lymphs Abs: 1.2 10*3/uL (ref 0.7–4.0)
MCH: 29.1 pg (ref 26.0–34.0)
MCHC: 31.7 g/dL (ref 30.0–36.0)
MCV: 91.9 fL (ref 80.0–100.0)
MONOS PCT: 10 %
Monocytes Absolute: 0.7 10*3/uL (ref 0.1–1.0)
Neutro Abs: 4.5 10*3/uL (ref 1.7–7.7)
Neutrophils Relative %: 68 %
Platelets: 207 10*3/uL (ref 150–400)
RBC: 4.7 MIL/uL (ref 3.87–5.11)
RDW: 13.7 % (ref 11.5–15.5)
WBC: 6.6 10*3/uL (ref 4.0–10.5)
nRBC: 0 % (ref 0.0–0.2)

## 2018-10-21 LAB — BASIC METABOLIC PANEL
Anion gap: 12 (ref 5–15)
BUN: 20 mg/dL (ref 8–23)
CO2: 33 mmol/L — ABNORMAL HIGH (ref 22–32)
CREATININE: 0.9 mg/dL (ref 0.44–1.00)
Calcium: 8.6 mg/dL — ABNORMAL LOW (ref 8.9–10.3)
Chloride: 95 mmol/L — ABNORMAL LOW (ref 98–111)
GFR calc Af Amer: >60 mL/min (ref >60)
GFR calc non Af Amer: 55 mL/min — ABNORMAL LOW (ref >60)
Glucose, Bld: 142 mg/dL — ABNORMAL HIGH (ref 70–99)
Potassium: 3.8 mmol/L (ref 3.5–5.1)
Sodium: 140 mmol/L (ref 135–145)

## 2018-10-21 MED ORDER — FUROSEMIDE 40 MG PO TABS
80.0000 mg | ORAL_TABLET | Freq: Every day | ORAL | Status: DC
  Administered 2018-10-21 – 2018-10-22 (×2): 80 mg via ORAL
  Filled 2018-10-21 (×2): qty 2

## 2018-10-21 NOTE — NC FL2 (Signed)
[place] MEDICAID FL2 LEVEL OF CARE SCREENING TOOL     IDENTIFICATION  Patient Name: Charlene Greene Birthdate: [birth year] Sex: female Admission Date (Current Location): 10/18/2018  [place]   Facility and Address:  [facility],  [address], [place] [address]      Provider Number: [id number]  Attending Physician Name and Address:  Zigmund DanielPowell, A Caldwell Jr., *  Relative Name and Phone Number:  Isa RankinKathryn Aldridge, [phone]    Current Level of Care: Hospital Recommended Level of Care: Assisted Living Facility Prior Approval Number:    Date Approved/Denied:   PASRR Number:    Discharge Plan: Home(back to alf)    Current Diagnoses: Patient Active Problem List   Diagnosis Date Noted  . Acute on chronic diastolic CHF (congestive heart failure) (HCC) 10/19/2018  . Non-ST elevation (NSTEMI) myocardial infarction (HCC) 08/05/2018  . Oxygen dependent 08/02/2018  . Elevated troponin 08/02/2018  . Pressure injury of skin 05/12/2018  . HCAP (healthcare-associated pneumonia) 05/08/2018  . Acute respiratory failure with hypoxia (HCC) 05/07/2018  . Sleep disturbance   . Elevated BUN   . Labile blood pressure   . Essential hypertension   . Dysphasia, post-stroke   . PAD (peripheral artery disease) (HCC)   . Hypoalbuminemia due to protein-calorie malnutrition (HCC)   . Diabetic peripheral neuropathy (HCC)   . Diabetes mellitus type 2 in nonobese (HCC)   . Chronic diastolic congestive heart failure (HCC)   . Left thalamic infarction (HCC) 02/26/2018  . Dysphagia, post-stroke   . Acute on chronic diastolic congestive heart failure (HCC)   . Type 2 diabetes mellitus with peripheral neuropathy (HCC)   . TIA (transient ischemic attack) 02/22/2018  . Labile blood glucose   . Hypotension due to drugs   . Hyponatremia   . Renovascular hypertension   . Hypertensive crisis   . Type 2 diabetes mellitus with diabetic autonomic neuropathy,  without long-term current use of insulin (HCC)   . Stage 3 chronic kidney disease (HCC)   . Acute lower UTI   . Acute renal insufficiency 09/27/2016  . Urinary urgency 09/27/2016  . Basal ganglia infarction (HCC) 09/26/2016  . Neuropathic pain   . Benign essential HTN   . Slow transit constipation   . Elevated creatine kinase   . Accelerated hypertension 09/24/2016  . Diabetes mellitus (HCC) 09/24/2016  . Hyperlipidemia 09/24/2016  . Sick sinus syndrome (HCC) 09/24/2016  . CVA (cerebral vascular accident) (HCC) 09/23/2016  . Stroke (cerebrum) (HCC) 09/23/2016    Orientation RESPIRATION BLADDER Height & Weight     Self, Time, Situation, Place  O2(3L) External catheter, Continent Weight: 163 lb 12.8 oz (74.3 kg) Height:  5\' 4"  (162.6 cm)  BEHAVIORAL SYMPTOMS/MOOD NEUROLOGICAL BOWEL NUTRITION STATUS      Continent Diet(carb modified)  AMBULATORY STATUS COMMUNICATION OF NEEDS Skin   Limited Assist Verbally                         Personal Care Assistance Level of Assistance  Bathing, Feeding, Dressing Bathing Assistance: Limited assistance Feeding assistance: Independent Dressing Assistance: Limited assistance     Functional Limitations Info  Sight, Hearing, Speech Sight Info: Adequate Hearing Info: Adequate Speech Info: Adequate    SPECIAL CARE FACTORS FREQUENCY  PT (By licensed PT), OT (By licensed OT)     PT Frequency: 3x wk home health OT Frequency: 3x wk home health  Contractures Contractures Info: Not present    Additional Factors Info  Code Status, Allergies Code Status Info: DNR Allergies Info: STATINS, OTHER, AMLODIPINE, CELECOXIB, CODEINE, NAPROXEN, PIROXICAM           Current Medications (10/21/2018):  This is the current hospital active medication list Current Facility-Administered Medications  Medication Dose Route Frequency Provider Last Rate Last Dose  . 0.9 %  sodium chloride infusion  250 mL Intravenous PRN Therisa Doyne, MD      . acetaminophen (TYLENOL) tablet 650 mg  650 mg Oral Q6H PRN Doutova, Anastassia, MD       Or  . acetaminophen (TYLENOL) suppository 650 mg  650 mg Rectal Q6H PRN Doutova, Anastassia, MD      . amLODipine (NORVASC) tablet 10 mg  10 mg Oral Daily Doutova, Anastassia, MD   10 mg at 10/21/18 1027  . aspirin chewable tablet 81 mg  81 mg Oral Daily Doutova, Anastassia, MD   81 mg at 10/21/18 1025  . clopidogrel (PLAVIX) tablet 75 mg  75 mg Oral Q breakfast Doutova, Anastassia, MD   75 mg at 10/21/18 1025  . enoxaparin (LOVENOX) injection 40 mg  40 mg Subcutaneous Q24H Briant Cedar, MD   40 mg at 10/20/18 1651  . feeding supplement (ENSURE ENLIVE) (ENSURE ENLIVE) liquid 237 mL  237 mL Oral BID BM Briant Cedar, MD      . furosemide (LASIX) tablet 80 mg  80 mg Oral Daily Zigmund Daniel., MD      . hydrALAZINE (APRESOLINE) injection 10 mg  10 mg Intravenous Q6H PRN Bodenheimer, Charles A, NP   10 mg at 10/20/18 0554  . insulin aspart (novoLOG) injection 0-5 Units  0-5 Units Subcutaneous QHS Doutova, Anastassia, MD      . insulin aspart (novoLOG) injection 0-9 Units  0-9 Units Subcutaneous TID WC Doutova, Anastassia, MD   1 Units at 10/21/18 1028  . isosorbide mononitrate (IMDUR) 24 hr tablet 60 mg  60 mg Oral Daily Doutova, Anastassia, MD   60 mg at 10/21/18 1026  . lisinopril (PRINIVIL,ZESTRIL) tablet 20 mg  20 mg Oral BID Therisa Doyne, MD   20 mg at 10/21/18 1027  . MEDLINE mouth rinse  15 mL Mouth Rinse BID Briant Cedar, MD      . metoprolol succinate (TOPROL-XL) 24 hr tablet 100 mg  100 mg Oral Daily Doutova, Anastassia, MD   100 mg at 10/21/18 1026  . ondansetron (ZOFRAN) tablet 4 mg  4 mg Oral Q6H PRN Therisa Doyne, MD       Or  . ondansetron (ZOFRAN) injection 4 mg  4 mg Intravenous Q6H PRN Doutova, Anastassia, MD      . ranolazine (RANEXA) 12 hr tablet 500 mg  500 mg Oral BID Doutova, Anastassia, MD   500 mg at 10/21/18 1027  . senna  (SENOKOT) tablet 17.2 mg  2 tablet Oral Q M,W,F-2000 Doutova, Anastassia, MD      . sodium chloride flush (NS) 0.9 % injection 3 mL  3 mL Intravenous Q12H Doutova, Anastassia, MD   3 mL at 10/21/18 1028  . sodium chloride flush (NS) 0.9 % injection 3 mL  3 mL Intravenous PRN Therisa Doyne, MD   3 mL at 10/19/18 2235     Discharge Medications: Please see discharge summary for a list of discharge medications.  Relevant Imaging Results:  Relevant Lab Results:   Additional Information SS#: [id number]  Althea Charon, LCSW

## 2018-10-21 NOTE — Progress Notes (Addendum)
PROGRESS NOTE    Charlene Greene  ZOX:[id number] DOB: [birth year] DOA: 10/18/2018 PCP: [facility]   Brief Narrative:  HPI from Dr Hattie Perch Cliftonis a 84 y.o.femalewith medical history significant for CAD, chronic diastolic heart failure, hypertension, sick sinus syndrome status post Medtronic dual-chamber pacemaker, hyperlipidemia, CVAs with residualLeft sided weakness,DM2, chronic respiratory failure on O2 3Lat baseline, presents to the ED with progressive SOB, dyspnea, requiring increasing O2, BLE edema. Denies any fever/chills or hx of COPD. Pt resides in assisted living and complying to home medications. Last admit in September 2019 for NSTEMIandVtach, cardiology consulted felt thatelevated troponin likely due to mild ischemia in the setting of diastolic dysfunction HF, medications were continued and was discharged on Lasix 80 mg a day. Pt admitted for further management.  Assessment & Plan:   Active Problems:   Accelerated hypertension   Diabetes mellitus (HCC)   Hyperlipidemia   Sick sinus syndrome (HCC)   Type 2 diabetes mellitus with diabetic autonomic neuropathy, without long-term current use of insulin (HCC)   Acute on chronic diastolic congestive heart failure (HCC)   Acute respiratory failure with hypoxia (HCC)   Oxygen dependent   Acute on chronic diastolic CHF (congestive heart failure) (HCC)   Acute on chronic diastolic HF BNP 462, about 900, 2 months ago Troponin negative EKG showed paced rhythm, prolonged QTC Chest CT with findings c/w CHF (vs pneumonia) Echo done on 02/2018 showed EF of 65 to 70%, grade 1 diastolic dysfunction Continue IV Lasix -> will transition to PO lasix today, possible d/c tomorrow if continues to do well Strict I's and O's, daily weights  Acute on chronic hypoxic respiratory failure Oxygen dependent, on home O2 3 L Thought 2/2 HF? Continue supplemental oxygen Management as  above  Hypokalemia Resolved  Type 2 diabetes mellitus Last A1c, 6.4, controlled Continue SSI, Accu-Cheks, hypoglycemic protocol Held home glimepiride  Hypertension Stable Continue amlodipine, imdur, lisinopril  History of CAD Currently denies any chest pain Continue Plavix, imdur, lisinopril, metoprolol, ranolazine, ASA  History of CVA Stable residual left-sided weakness Continue ASA, Plavix  Sick sinus syndrome Status post pacemaker  DVT prophylaxis: lovenox Code Status: DNR Family Communication: son over phone Disposition Plan: transitioned to PO lasix today, if she continues to do well on oral lasix, likely discharge tomorrow  Consultants:   none  Procedures:   none  Antimicrobials: Anti-infectives (From admission, onward)   None     Subjective: No complaints. Still some swelling.  Objective: Vitals:   10/20/18 1323 10/20/18 2054 10/21/18 0647 10/21/18 1500  BP: 140/61 (!) 179/75 (!) 165/84 (!) 143/67  Pulse: 61 63 61 70  Resp: 16 18 18 18   Temp: 98.7 F (37.1 C) 98.6 F (37 C) 97.8 F (36.6 C) 98 F (36.7 C)  TempSrc: Oral Oral Oral Oral  SpO2: 96% 95% 97% 98%  Weight:      Height:        Intake/Output Summary (Last 24 hours) at 10/21/2018 1845 Last data filed at 10/21/2018 1534 Gross per 24 hour  Intake 240 ml  Output 1550 ml  Net -1310 ml   Filed Weights   10/20/18 0504  Weight: 74.3 kg    Examination:  General exam: Appears calm and comfortable  Respiratory system: Clear to auscultation. Respiratory effort normal. Cardiovascular system: S1 & S2 heard, RRR Gastrointestinal system: Abdomen is nondistended, soft and nontender Central nervous system: Alert and oriented. No focal neurological deficits. Extremities: dependent sacral edema, trace LE edema Skin: No rashes, lesions or  ulcers Psychiatry: Judgement and insight appear normal. Mood & affect appropriate.     Data Reviewed: I have personally reviewed following  labs and imaging studies  CBC: Recent Labs  Lab 10/18/18 2129 10/19/18 0615 10/21/18 0541  WBC 5.6 5.4 6.6  NEUTROABS 4.1  --  4.5  HGB 14.6 14.0 13.7  HCT 45.0 43.3 43.2  MCV 93.0 91.2 91.9  PLT 226 226 207   Basic Metabolic Panel: Recent Labs  Lab 10/18/18 2129 10/19/18 0615 10/20/18 0542 10/21/18 0541  NA 143 142 142 140  K 3.9 3.2* 3.8 3.8  CL 103 97* 97* 95*  CO2 30 34* 36* 33*  GLUCOSE 145* 149* 128* 142*  BUN 16 14 18 20   CREATININE 0.74 0.67 0.88 0.90  CALCIUM 9.0 8.7* 8.5* 8.6*  MG  --  1.9  --   --   PHOS  --  4.0  --   --    GFR: Estimated Creatinine Clearance: 37.7 mL/min (by C-G formula based on SCr of 0.9 mg/dL). Liver Function Tests: Recent Labs  Lab 10/18/18 2129 10/19/18 0615  AST 18 14*  ALT 22 19  ALKPHOS 88 75  BILITOT 1.0 0.7  PROT 7.1 6.4*  ALBUMIN 4.1 3.6   No results for input(s): LIPASE, AMYLASE in the last 168 hours. No results for input(s): AMMONIA in the last 168 hours. Coagulation Profile: No results for input(s): INR, PROTIME in the last 168 hours. Cardiac Enzymes: Recent Labs  Lab 10/18/18 2129 10/19/18 0211 10/19/18 1334  TROPONINI <0.03 <0.03 <0.03   BNP (last 3 results) No results for input(s): PROBNP in the last 8760 hours. HbA1C: Recent Labs    10/19/18 0615  HGBA1C 6.4*   CBG: Recent Labs  Lab 10/20/18 1614 10/20/18 2051 10/21/18 0725 10/21/18 1237 10/21/18 1711  GLUCAP 135* 153* 149* 254* 121*   Lipid Profile: No results for input(s): CHOL, HDL, LDLCALC, TRIG, CHOLHDL, LDLDIRECT in the last 72 hours. Thyroid Function Tests: Recent Labs    10/19/18 0615  TSH 4.387   Anemia Panel: No results for input(s): VITAMINB12, FOLATE, FERRITIN, TIBC, IRON, RETICCTPCT in the last 72 hours. Sepsis Labs: No results for input(s): PROCALCITON, LATICACIDVEN in the last 168 hours.  Recent Results (from the past 240 hour(s))  MRSA PCR Screening     Status: None   Collection Time: 10/19/18  6:18 PM  Result  Value Ref Range Status   MRSA by PCR NEGATIVE NEGATIVE Final    Comment:        The GeneXpert MRSA Assay (FDA approved for NASAL specimens only), is one component of a comprehensive MRSA colonization surveillance program. It is not intended to diagnose MRSA infection nor to guide or monitor treatment for MRSA infections. Performed at [facility], [address] [place], [place] [address]          Radiology Studies: No results found.      Scheduled Meds: . amLODipine  10 mg Oral Daily  . aspirin  81 mg Oral Daily  . clopidogrel  75 mg Oral Q breakfast  . enoxaparin (LOVENOX) injection  40 mg Subcutaneous Q24H  . feeding supplement (ENSURE ENLIVE)  237 mL Oral BID BM  . furosemide  80 mg Oral Daily  . insulin aspart  0-5 Units Subcutaneous QHS  . insulin aspart  0-9 Units Subcutaneous TID WC  . isosorbide mononitrate  60 mg Oral Daily  . lisinopril  20 mg Oral BID  . mouth rinse  15 mL  Mouth Rinse BID  . metoprolol succinate  100 mg Oral Daily  . ranolazine  500 mg Oral BID  . senna  2 tablet Oral Q M,W,F-2000  . sodium chloride flush  3 mL Intravenous Q12H   Continuous Infusions: . sodium chloride       LOS: 2 days    Time spent: over 30 min    Lacretia Nicks, MD [facility] Pager [phone]  If 7PM-7AM, please contact night-coverage [email or web address] Password [facility] 10/21/2018, 6:45 PM

## 2018-10-21 NOTE — Care Management Note (Signed)
Case Management Note  Patient Details  Name: Charlene Greene MRN: [id number] Date of Birth: 1922/04/17  Subjective/Objective: Accelerated htn. From ALF-[facility]-active whome 02-[facility]. PT-recc HHPT. [facility]. CSW will manage return back to ALF.                   Action/Plan:d/c ALF w/HHC.   Expected Discharge Date:                  Expected Discharge Plan:  Home w Home Health Services  In-House Referral:  Clinical Social Work  Discharge planning Services  CM Consult  Post Acute Care Choice:  Durable Medical Equipment(Active w/home 02-[facility];has rw,w/c) Choice offered to:  Patient  DME Arranged:    DME Agency:     HH Arranged:  PT HH Agency:  Other - See comment  Status of Service:  In process, will continue to follow  If discussed at Long Length of Stay Meetings, dates discussed:    Additional Comments:  Lanier ClamMahabir, Graylee Arutyunyan, RN 10/21/2018, 12:20 PM

## 2018-10-21 NOTE — Progress Notes (Signed)
Patient's family member reported to tech that patient's mouth seemed dropped on one side. Neuro assessment negative. Confirmed by second RN.

## 2018-10-21 NOTE — Care Management Note (Signed)
Case Management Note  Patient Details  Name: Charlene Greene MRN: [id number] Date of Birth: 03-05-1922  Subjective/Objective: [facility] [phone] [facility] Mike aware of HHPT. CSW will manage return back to ALF.                    Action/Plan:dc ALF w/HHPT/has home 02-[facility].   Expected Discharge Date:                  Expected Discharge Plan:  Home w Home Health Services  In-House Referral:  Clinical Social Work  Discharge planning Services  CM Consult  Post Acute Care Choice:  Durable Medical Equipment(Active w/home 02-[facility];has rw,w/c) Choice offered to:  Patient  DME Arranged:    DME Agency:     HH Arranged:  PT HH Agency:  [facility]  Status of Service:  In process, will continue to follow  If discussed at Long Length of Stay Meetings, dates discussed:    Additional Comments:  Lanier ClamMahabir, Wahneta Derocher, RN 10/21/2018, 1:39 PM

## 2018-10-22 DIAGNOSIS — I5033 Acute on chronic diastolic (congestive) heart failure: Secondary | ICD-10-CM

## 2018-10-22 LAB — BASIC METABOLIC PANEL
Anion gap: 10 (ref 5–15)
BUN: 25 mg/dL — ABNORMAL HIGH (ref 8–23)
CHLORIDE: 96 mmol/L — AB (ref 98–111)
CO2: 35 mmol/L — ABNORMAL HIGH (ref 22–32)
Calcium: 8.5 mg/dL — ABNORMAL LOW (ref 8.9–10.3)
Creatinine, Ser: 1.07 mg/dL — ABNORMAL HIGH (ref 0.44–1.00)
GFR calc Af Amer: 51 mL/min — ABNORMAL LOW (ref >60)
GFR calc non Af Amer: 44 mL/min — ABNORMAL LOW (ref >60)
Glucose, Bld: 147 mg/dL — ABNORMAL HIGH (ref 70–99)
Potassium: 4.1 mmol/L (ref 3.5–5.1)
SODIUM: 141 mmol/L (ref 135–145)

## 2018-10-22 LAB — CBC
HCT: 42.1 % (ref 36.0–46.0)
Hemoglobin: 13.5 g/dL (ref 12.0–15.0)
MCH: 29.6 pg (ref 26.0–34.0)
MCHC: 32.1 g/dL (ref 30.0–36.0)
MCV: 92.3 fL (ref 80.0–100.0)
PLATELETS: 206 10*3/uL (ref 150–400)
RBC: 4.56 MIL/uL (ref 3.87–5.11)
RDW: 13.8 % (ref 11.5–15.5)
WBC: 5.3 10*3/uL (ref 4.0–10.5)
nRBC: 0 % (ref 0.0–0.2)

## 2018-10-22 LAB — GLUCOSE, CAPILLARY
Glucose-Capillary: 150 mg/dL — ABNORMAL HIGH (ref 70–99)
Glucose-Capillary: 320 mg/dL — ABNORMAL HIGH (ref 70–99)

## 2018-10-22 LAB — MAGNESIUM: Magnesium: 2.2 mg/dL (ref 1.7–2.4)

## 2018-10-22 MED ORDER — FUROSEMIDE 40 MG PO TABS: ORAL_TABLET | ORAL | 0 refills | Status: AC

## 2018-10-22 NOTE — Discharge Instructions (Signed)
Heart Failure °Heart failure means your heart has trouble pumping blood. This makes it hard for your body to work well. Heart failure is usually a long-term (chronic) condition. You must take good care of yourself and follow your doctor's treatment plan. °Follow these instructions at home: °· Take your heart medicine as told by your doctor. °? Do not stop taking medicine unless your doctor tells you to. °? Do not skip any dose of medicine. °? Refill your medicines before they run out. °? Take other medicines only as told by your doctor or pharmacist. °· Stay active if told by your doctor. The elderly and people with severe heart failure should talk with a doctor about physical activity. °· Eat heart-healthy foods. Choose foods that are without trans fat and are low in saturated fat, cholesterol, and salt (sodium). This includes fresh or frozen fruits and vegetables, fish, lean meats, fat-free or low-fat dairy foods, whole grains, and high-fiber foods. Lentils and dried peas and beans (legumes) are also good choices. °· Limit salt if told by your doctor. °· Cook in a healthy way. Roast, grill, broil, bake, poach, steam, or stir-fry foods. °· Limit fluids as told by your doctor. °· Weigh yourself every morning. Do this after you pee (urinate) and before you eat breakfast. Write down your weight to give to your doctor. °· Take your blood pressure and write it down if your doctor tells you to. °· Ask your doctor how to check your pulse. Check your pulse as told. °· Lose weight if told by your doctor. °· Stop smoking or chewing tobacco. Do not use gum or patches that help you quit without your doctor's approval. °· Schedule and go to doctor visits as told. °· Nonpregnant women should have no more than 1 drink a day. Men should have no more than 2 drinks a day. Talk to your doctor about drinking alcohol. °· Stop illegal drug use. °· Stay current with shots (immunizations). °· Manage your health conditions as told by your  doctor. °· Learn to manage your stress. °· Rest when you are tired. °· If it is really hot outside: °? Avoid intense activities. °? Use air conditioning or fans, or get in a cooler place. °? Avoid caffeine and alcohol. °? Wear loose-fitting, lightweight, and light-colored clothing. °· If it is really cold outside: °? Avoid intense activities. °? Layer your clothing. °? Wear mittens or gloves, a hat, and a scarf when going outside. °? Avoid alcohol. °· Learn about heart failure and get support as needed. °· Get help to maintain or improve your quality of life and your ability to care for yourself as needed. °Contact a doctor if: °· You gain weight quickly. °· You are more short of breath than usual. °· You cannot do your normal activities. °· You tire easily. °· You cough more than normal, especially with activity. °· You have any or more puffiness (swelling) in areas such as your hands, feet, ankles, or belly (abdomen). °· You cannot sleep because it is hard to breathe. °· You feel like your heart is beating fast (palpitations). °· You get dizzy or light-headed when you stand up. °Get help right away if: °· You have trouble breathing. °· There is a change in mental status, such as becoming less alert or not being able to focus. °· You have chest pain or discomfort. °· You faint. °This information is not intended to replace advice given to you by your health care provider. Make sure you   discuss any questions you have with your health care provider. °Document Released: 08/07/2008 Document Revised: 04/05/2016 Document Reviewed: 12/15/2012 °Elsevier Interactive Patient Education © 2017 Elsevier Inc. ° °

## 2018-10-22 NOTE — Discharge Summary (Signed)
[facility]  Physician Discharge Summary   Patient ID: Charlene Greene MRN: [id number] DOB/AGE: 1922/05/18 84 y.o.  Admit date: 10/18/2018 Discharge date: 10/22/2018  PCP: [facility]  DISCHARGE DIAGNOSES:  Acute on chronic diastolic CHF, improved Acute on chronic hypoxic respiratory failure on home oxygen Diabetes mellitus type 2 Essential hypertension History of coronary artery disease CVA with left-sided weakness  RECOMMENDATIONS FOR OUTPATIENT FOLLOW UP: 1. Home health PT has been ordered 2. Check basic metabolic panel on Friday to check electrolytes and renal function 3. Dose of Lasix has been increased 4. Check CBGs   DISCHARGE CONDITION: fair  Diet recommendation: Modified carbohydrate  Filed Weights   10/20/18 0504 10/22/18 0419  Weight: 74.3 kg 74.6 kg    INITIAL HISTORY: Charlene Cliftonis a 84 y.o.femalewith medical history significant for CAD, chronic diastolic heart failure, hypertension, sick sinus syndrome status post Medtronic dual-chamber pacemaker, hyperlipidemia, CVAs with residualLeft sided weakness,DM2, chronic respiratory failure on O2 3Lat baseline, presents to the ED with progressive SOB, dyspnea, requiring increasing O2, BLE edema. Denies any fever/chills or hx of COPD. Pt resides in assisted living and complying to home medications. Last admit in September 2019 for NSTEMIandVtach, cardiology consulted felt thatelevated troponin likely due to mild ischemia in the setting of diastolic dysfunction HF, medications were continued and was discharged on Lasix 80 mg a day. Pt admitted for further management.  Consultations:  None  Procedures:  None   HOSPITAL COURSE:   Acute on chronic diastolic HF Patient was admitted with symptoms suggestive of acute CHF.  CT scan of the chest did not show any pulmonary embolism.  Interstitial edema was noted.  Last echocardiogram from April 2019 showed a EF of 65 to 70% with grade 1  diastolic dysfunction.  Patient was initially started on IV Lasix.  This resulted in improvement in her symptoms.  Transitioned to oral Lasix yesterday.  She will be discharged on slightly higher dose of Lasix than what she was getting at home.  Would recommend continuing to check weights at the assisted living facility.  Check labs on Friday.  Acute on chronic hypoxic respiratory failure Oxygen dependent, on home O2 3 L.  Acute hypoxia most likely due to CHF.  Now improved.  Back to baseline.  Hypokalemia Resolved.  Check electrolytes at the assisted living facility.  Type 2 diabetes mellitus Last A1c, 6.4, controlled.  CBGs noted to be mildly elevated.  She may resume her oral agents at home.  Essential hypertension Blood pressure is stable.  Continue current regimen.    History of CAD Stable.  Continue home medications.    History of CVA Stable residual left-sided weakness. Continue ASA, Plavix  Sick sinus syndrome Status post pacemaker  Patient did mention some cough when she drinks liquids.  This has been ongoing for a long period of time.  Patient was seen by speech therapy when she was last hospitalized in September.  She underwent a modified barium swallow at that time.  No significant abnormalities were noted.  Mild risk of aspiration.  She was encouraged to eat and drink slowly and use the strategies recommended by the speech therapist.  No esophageal abnormalities noted on CT scan.  Overall stable.  Okay for discharge back to her assisted living facility.  Home health will be ordered.   PERTINENT LABS:  The results of significant diagnostics from this hospitalization (including imaging, microbiology, ancillary and laboratory) are listed below for reference.    Microbiology: Recent Results (from the past 240  hour(s))  MRSA PCR Screening     Status: None   Collection Time: 10/19/18  6:18 PM  Result Value Ref Range Status   MRSA by PCR NEGATIVE NEGATIVE Final     Comment:        The GeneXpert MRSA Assay (FDA approved for NASAL specimens only), is one component of a comprehensive MRSA colonization surveillance program. It is not intended to diagnose MRSA infection nor to guide or monitor treatment for MRSA infections. Performed at [facility], [address], [place], [place] [address]      Labs: Basic Metabolic Panel: Recent Labs  Lab 10/18/18 2129 10/19/18 0615 10/20/18 0542 10/21/18 0541 10/22/18 0538  NA 143 142 142 140 141  K 3.9 3.2* 3.8 3.8 4.1  CL 103 97* 97* 95* 96*  CO2 30 34* 36* 33* 35*  GLUCOSE 145* 149* 128* 142* 147*  BUN 16 14 18 20  25*  CREATININE 0.74 0.67 0.88 0.90 1.07*  CALCIUM 9.0 8.7* 8.5* 8.6* 8.5*  MG  --  1.9  --   --  2.2  PHOS  --  4.0  --   --   --    Liver Function Tests: Recent Labs  Lab 10/18/18 2129 10/19/18 0615  AST 18 14*  ALT 22 19  ALKPHOS 88 75  BILITOT 1.0 0.7  PROT 7.1 6.4*  ALBUMIN 4.1 3.6   CBC: Recent Labs  Lab 10/18/18 2129 10/19/18 0615 10/21/18 0541 10/22/18 0538  WBC 5.6 5.4 6.6 5.3  NEUTROABS 4.1  --  4.5  --   HGB 14.6 14.0 13.7 13.5  HCT 45.0 43.3 43.2 42.1  MCV 93.0 91.2 91.9 92.3  PLT 226 226 207 206   Cardiac Enzymes: Recent Labs  Lab 10/18/18 2129 10/19/18 0211 10/19/18 1334  TROPONINI <0.03 <0.03 <0.03   BNP: BNP (last 3 results) Recent Labs    05/10/18 0502 08/02/18 0000 10/18/18 2129  BNP 264.0* 900.5* 462.4*     CBG: Recent Labs  Lab 10/21/18 0725 10/21/18 1237 10/21/18 1711 10/21/18 2311 10/22/18 0713  GLUCAP 149* 254* 121* 131* 150*     IMAGING STUDIES Dg Chest 2 View  Result Date: 10/18/2018 CLINICAL DATA:  Shortness of breath for 3 days. EXAM: CHEST - 2 VIEW COMPARISON:  08/04/2018 FINDINGS: 2152 hours. Low lung volumes. Bibasilar collapse/consolidation appears progressed in the interval. Associated small bilateral pleural effusions. There is pulmonary vascular congestion without overt pulmonary edema.  The cardio pericardial silhouette is enlarged. The visualized bony structures of the thorax are intact. Degenerative changes are noted in both shoulders. Left-sided permanent pacemaker again noted. Telemetry leads overlie the chest. IMPRESSION: Cardiomegaly with progression of bibasilar collapse/consolidation. Small bilateral pleural effusions. Vascular congestion. Electronically Signed   By: Kennith Center M.D.   On: 10/18/2018 22:21   Ct Angio Chest Pe W And/or Wo Contrast  Result Date: 10/19/2018 CLINICAL DATA:  84 year old female with shortness of breath. Chest pain. EXAM: CT ANGIOGRAPHY CHEST WITH CONTRAST TECHNIQUE: Multidetector CT imaging of the chest was performed using the standard protocol during bolus administration of intravenous contrast. Multiplanar CT image reconstructions and MIPs were obtained to evaluate the vascular anatomy. CONTRAST:  ISOVUE-370 IOPAMIDOL (ISOVUE-370) INJECTION 76% COMPARISON:  Chest radiograph dated 10/18/2018 FINDINGS: Evaluation is limited due to streak artifact caused by patient's arms as well as metallic pacemaker device. Cardiovascular: There is mild cardiomegaly. Coronary vascular calcification and cardiac pacemaker wire noted. No pericardial effusion. There is moderate atherosclerotic calcification of the thoracic aorta.  There is no CT evidence of pulmonary embolism. Mediastinum/Nodes: No hilar or mediastinal adenopathy. Esophagus is grossly unremarkable. No mediastinal fluid collection. Lungs/Pleura: Moderate bilateral pleural effusions with associated partial compressive atelectasis of the lower lobes. Pneumonia is not excluded. Clinical correlation is recommended. Patchy airspace ground-glass opacity in the upper lobes with interlobular septal prominence likely a degree of edema. There is no pneumothorax. 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 The central airways remain patent. Upper Abdomen: There is retrograde flow of contrast from the right atrium into the IVC  consistent with right heart dysfunction. The visualized upper abdomen is otherwise unremarkable. Musculoskeletal: Osteopenia with degenerative changes of the spine. No acute osseous pathology. Review of the MIP images confirms the above findings. IMPRESSION: 1. No CT evidence of pulmonary embolism. 2. Cardiomegaly with findings of CHF. Moderate bilateral pleural effusions with associated partial compressive atelectasis of the lower lobes. Pneumonia is not excluded. Clinical correlation is recommended. Electronically Signed   By: Elgie Collard M.D.   On: 10/19/2018 00:27    DISCHARGE EXAMINATION: Vitals:   10/21/18 0647 10/21/18 1500 10/21/18 2042 10/22/18 0419  BP: (!) 165/84 (!) 143/67 (!) 155/70 (!) 156/75  Pulse: 61 70 61 64  Resp: 18 18 18 18   Temp: 97.8 F (36.6 C) 98 F (36.7 C) 98.7 F (37.1 C) 97.8 F (36.6 C)  TempSrc: Oral Oral Oral Oral  SpO2: 97% 98% 96% 98%  Weight:    74.6 kg  Height:       General appearance: alert, cooperative, appears stated age and no distress Head: Normocephalic, without obvious abnormality, atraumatic Resp: Good air entry bilaterally.  Not noted to be tachypneic.  Few crackles at the bases.  No wheezing or rhonchi. Cardio: regular rate and rhythm, S1, S2 normal, no murmur, click, rub or gallop GI: soft, non-tender; bowel sounds normal; no masses,  no organomegaly Extremities: extremities normal, atraumatic, no cyanosis or edema Pulses: 2+ and symmetric  DISPOSITION: ALF  Discharge Instructions    (HEART FAILURE PATIENTS) Call MD:  Anytime you have any of the following symptoms: 1) 3 pound weight gain in 24 hours or 5 pounds in 1 week 2) shortness of breath, with or without a dry hacking cough 3) swelling in the hands, feet or stomach 4) if you have to sleep on extra pillows at night in order to breathe.   Complete by:  As directed    Call MD for:  difficulty breathing, headache or visual disturbances   Complete by:  As directed    Call MD for:   extreme fatigue   Complete by:  As directed    Call MD for:  persistant dizziness or light-headedness   Complete by:  As directed    Call MD for:  persistant nausea and vomiting   Complete by:  As directed    Call MD for:  severe uncontrolled pain   Complete by:  As directed    Call MD for:  temperature >100.4   Complete by:  As directed    Discharge instructions   Complete by:  As directed    See instructions on the discharge summary.  You were cared for by a hospitalist during your hospital stay. If you have any questions about your discharge medications or the care you received while you were in the hospital after you are discharged, you can call the unit and asked to speak with the hospitalist on call if the hospitalist that took care of you is not available. Once you are discharged,  your primary care physician will handle any further medical issues. Please note that NO REFILLS for any discharge medications will be authorized once you are discharged, as it is imperative that you return to your primary care physician (or establish a relationship with a primary care physician if you do not have one) for your aftercare needs so that they can reassess your need for medications and monitor your lab values. If you do not have a primary care physician, you can call [phone] for a physician referral.   Increase activity slowly   Complete by:  As directed         Allergies as of 10/22/2018      Reactions   Statins Other (See Comments)   Muscle weakness/pain   Other Other (See Comments)   Reductase Inhibitors- "Allergic," per MAR/reaction not recalled by the patient "Dye"- "Allergic," per MAR/reaction not recalled by the patient   Amlodipine Other (See Comments)   Unknown   Celecoxib Nausea Only, Other (See Comments)   Codeine Other (See Comments)   "Allergic," per MAR/reaction not recalled by the patient   Naproxen Other (See Comments)   Unknown   Piroxicam Other (See Comments)    "Allergic," per MAR/reaction not recalled by the patient      Medication List    TAKE these medications   acetaminophen 500 MG tablet Commonly known as:  TYLENOL Take 500 mg by mouth 2 (two) times daily.   acetaminophen 500 MG tablet Commonly known as:  TYLENOL Take 500 mg by mouth every 12 (twelve) hours as needed for mild pain.   amLODipine 10 MG tablet Commonly known as:  NORVASC Take 1 tablet (10 mg total) by mouth 2 (two) times daily. 10mg  in the am and 5mg  in the pm What changed:    when to take this  additional instructions  Another medication with the same name was removed. Continue taking this medication, and follow the directions you see here.   aspirin 81 MG chewable tablet Chew 1 tablet (81 mg total) by mouth daily. With Food   cholecalciferol 25 MCG (1000 UT) tablet Commonly known as:  VITAMIN D3 Take 1,000 Units by mouth daily.   clopidogrel 75 MG tablet Commonly known as:  PLAVIX Take 1 tablet (75 mg total) by mouth daily.   dextromethorphan-guaiFENesin 30-600 MG 12hr tablet Commonly known as:  MUCINEX DM Take 1 tablet by mouth 2 (two) times daily.   feeding supplement (ENSURE ENLIVE) Liqd Take 237 mLs by mouth 2 (two) times daily between meals.   furosemide 40 MG tablet Commonly known as:  LASIX Take 80mg  in Am and 40mg  in PM. What changed:    medication strength  how much to take  how to take this  when to take this  additional instructions   glimepiride 1 MG tablet Commonly known as:  AMARYL Take 1 tablet (1 mg total) by mouth daily with breakfast.   isosorbide mononitrate 60 MG 24 hr tablet Commonly known as:  IMDUR Take 1 tablet (60 mg total) by mouth daily.   lisinopril 20 MG tablet Commonly known as:  PRINIVIL,ZESTRIL Take 1 tablet (20 mg total) by mouth 2 (two) times daily.   Magnesium 250 MG Tabs Take 250 mg by mouth at bedtime.   metoprolol succinate 100 MG 24 hr tablet Commonly known as:  TOPROL-XL Take 1 tablet (100  mg total) by mouth daily.   multivitamin with minerals Tabs tablet Take 1 tablet by mouth daily.   nitroGLYCERIN 0.4  MG SL tablet Commonly known as:  NITROSTAT Place 0.4 mg under the tongue every 5 (five) minutes x 3 doses as needed for chest pain (and call 9-1-1 and MD, if no relief).   ranolazine 500 MG 12 hr tablet Commonly known as:  RANEXA Take 1 tablet (500 mg total) by mouth 2 (two) times daily.   senna 8.6 MG Tabs tablet Commonly known as:  SENOKOT Take 2 tablets (17.2 mg total) by mouth at bedtime. What changed:  when to take this        Follow-up Information    [facility]   Specialty:  Geriatric Medicine Contact information: [address] [place] [address] [phone]        [facility].   Specialty:  Home Health Services Why:  [facility] physical therapy Contact information: [address] [place] [address] [phone]           TOTAL DISCHARGE TIME: 35 mins  Osvaldo Shipper  [facility] Pager [phone]  10/22/2018, 11:16 AM

## 2018-10-22 NOTE — Care Management Note (Signed)
Case Management Note  Patient Details  Name: Susa Griffinsell Rathgeber MRN: [id number] Date of Birth: [birth year]  Subjective/Objective: d/c today back to ALF-[facility]-with HHPT-[facility]-rep Kathlene NovemberMike aware. Already has home 02-[facility].No further CM needs.CSW managing return back.                   Action/Plan:d/c ALF;HHC   Expected Discharge Date:                  Expected Discharge Plan:  Home w Home Health Services  In-House Referral:  Clinical Social Work  Discharge planning Services  CM Consult  Post Acute Care Choice:  Durable Medical Equipment(Active w/home 02-[facility];has rw,w/c) Choice offered to:  Patient  DME Arranged:    DME Agency:     HH Arranged:  PT HH Agency:  [facility]  Status of Service:  Completed, signed off  If discussed at [employer] Meetings, dates discussed:    Additional Comments:  Lanier ClamMahabir, Tahira Olivarez, RN 10/22/2018, 11:03 AM

## 2018-10-22 NOTE — Progress Notes (Signed)
Clinical Social Worker facilitated patient discharge including contacting patient family and facility to confirm patient discharge plans.  Clinical information faxed to facility and family agreeable with plan.  [facility]  RN to call [phone] for report prior to discharge.  Clinical Social Worker will sign off for now as social work intervention is no longer needed. Please consult us again if new need arises.  Marrianne MoodAshley Darrin Koman, MSW, [employer]CSWA [phone]

## 2018-10-22 NOTE — Care Management Important Message (Signed)
Important Message  Patient Details  Name: Charlene Greene MRN: [id number] Date of Birth: 1922/08/30   Medicare Important Message Given:  Yes    Caren MacadamFuller, Larry Knipp 10/22/2018, 12:20 PMImportant Message  Patient Details  Name: Charlene Greene MRN: [id number] Date of Birth: 1922/08/30   Medicare Important Message Given:  Yes    Caren MacadamFuller, Felica Chargois 10/22/2018, 12:20 PM

## 2018-11-25 ENCOUNTER — Ambulatory Visit: Payer: Medicare Other | Admitting: Cardiology

## 2018-12-30 ENCOUNTER — Telehealth: Payer: Self-pay | Admitting: Cardiology

## 2018-12-30 NOTE — Telephone Encounter (Signed)
If fine with me if she does not come in.  I think it makes sense if it is very difficult to get her here.  Just 1 to make sure that she is controlling her fluid status.  As long as the medical staff there is on top of it.  I am okay with her not coming back at this time.  If the family would like to schedule a time if that is more convenient, I be happy to see her, but I do not know that she truthfully actually needs to continue seeing a cardiologist if he is she is being managed at home by home physicians.  We will always be here to be of assistance, but am okay with deferring to someone who sees her more frequently.  Especially because I only saw her for 4 days in the hospital  Bryan Lemma, MD

## 2018-12-30 NOTE — Telephone Encounter (Signed)
New Message   PTs son is calling and wants a call back to his wife Angelique Blonder, to take a message, [phone] Pts son wants to know if it is necessary for his mom to be seen for a 3 month f/u. He did reschedule the appt for a later time. Please call back

## 2018-12-30 NOTE — Telephone Encounter (Signed)
SPOKE  to [place] _ daughter -in -law Patient is not having any issues. Patient is living in an assisted living at present time. Angelique Blonder states it is difficult for patient to travel to office . Patient is seen @every  other wek by medical personnel. Aware will discuss with Dr Herbie Baltimore and contact  family

## 2018-12-31 NOTE — Telephone Encounter (Signed)
Left message to call back  

## 2019-01-02 ENCOUNTER — Ambulatory Visit: Payer: Medicare Other | Admitting: Cardiology

## 2019-01-02 NOTE — Telephone Encounter (Signed)
Attempted to contact patient, did not answer, rang x 8 no answer.

## 2019-01-02 NOTE — Telephone Encounter (Signed)
LEFT MESSAGE PATIENT DOES NOT NEED TO FOLLOW UP IN April 2020- AS LONG AS MEDICAL MANAGEMENT IS DONE BY PRIMARY- MAY CALL IF ASSISTANCE IS NEEDED.   LEFT MESSAGE TO CALL BACK OFFICE  TO INFORM MESSAGE WAS RECEIVED PRIOR TO RN CANCELLING APPOINTMENT IN APRIL

## 2019-01-05 ENCOUNTER — Ambulatory Visit: Payer: Medicare Other | Admitting: Cardiology

## 2019-01-12 NOTE — Telephone Encounter (Signed)
Spoke to daughter -in Social worker. She received message .    Agree with cancelling appointment for April 2020

## 2019-03-02 ENCOUNTER — Ambulatory Visit: Payer: Medicare Other | Admitting: Cardiology

## 2019-04-14 IMAGING — RF DG SWALLOWING FUNCTION - NRPT [facility]
13 of 16 series · 13 of 24 positions shown · non-contrast
Comparison: none

[Series 1: run · 1 of 17 frames shown (1 of 13)]
[frame 1/17]
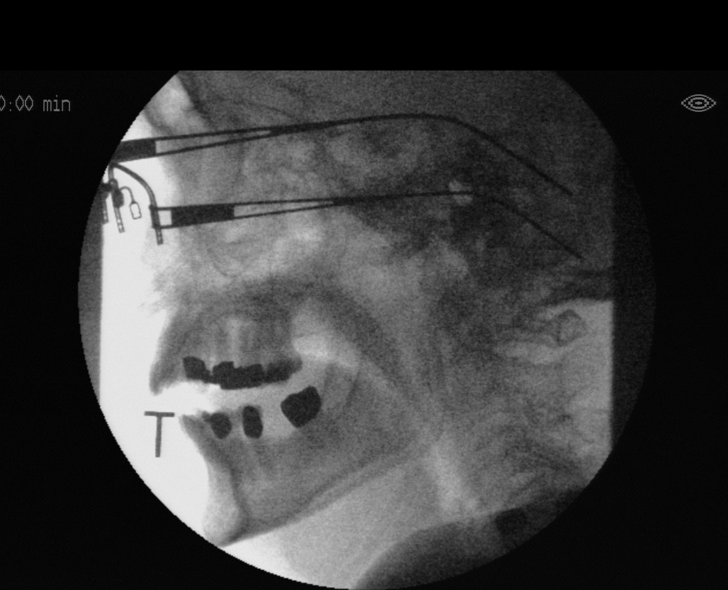

[Series 2: run · 1 of 18 frames shown (2 of 13)]
[frame 6/18]
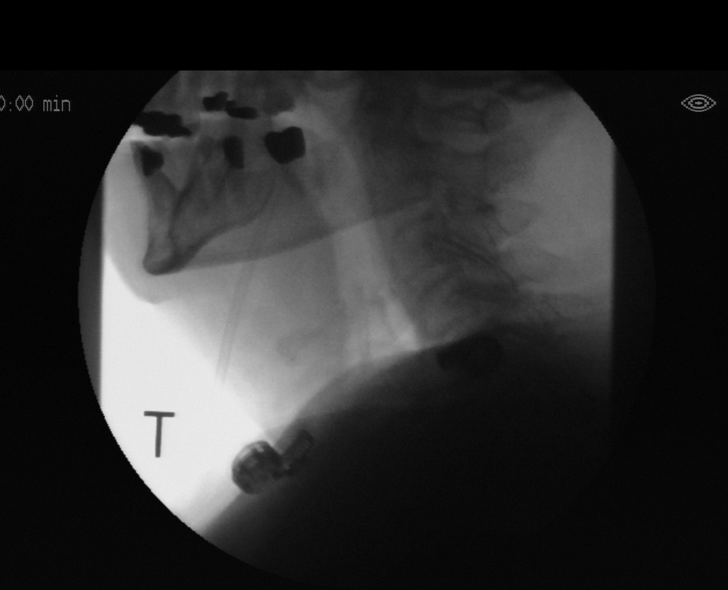

[Series 3: run · 1 of 280 frames shown (3 of 13)]
[frame 239/280]
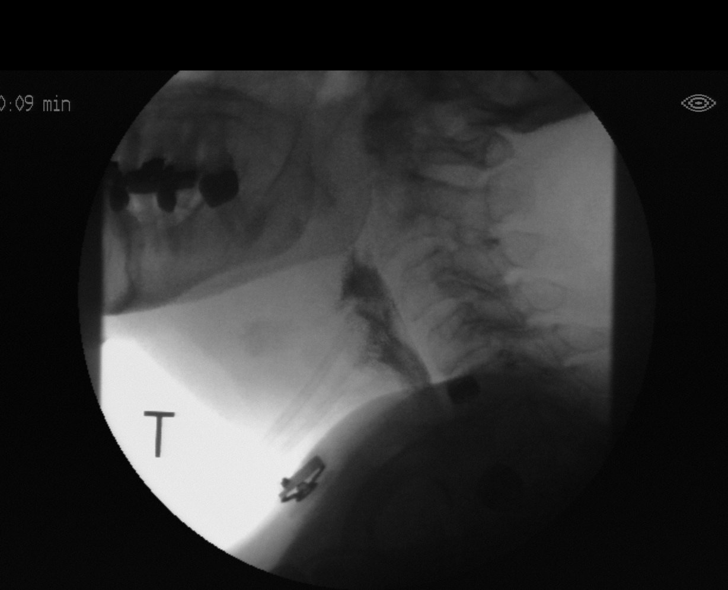

[Series 5: run · 1 of 104 frames shown (4 of 13)]
[frame 16/104]
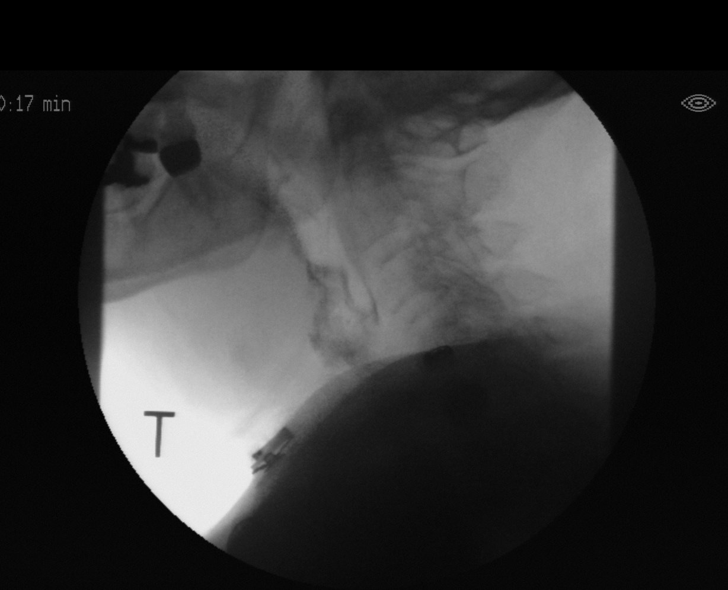

[Series 6: run · 1 of 359 frames shown (5 of 13)]
[frame 180/359]
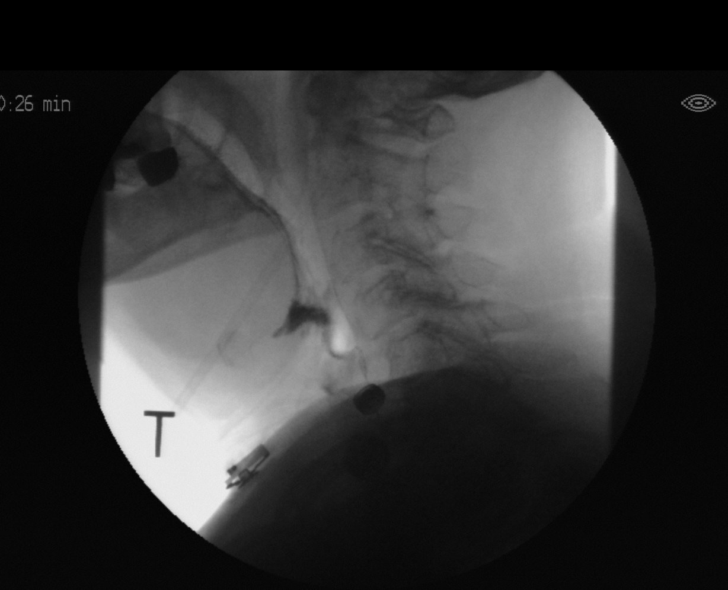

[Series 7: run · 1 of 347 frames shown (6 of 13)]
[frame 295/347]
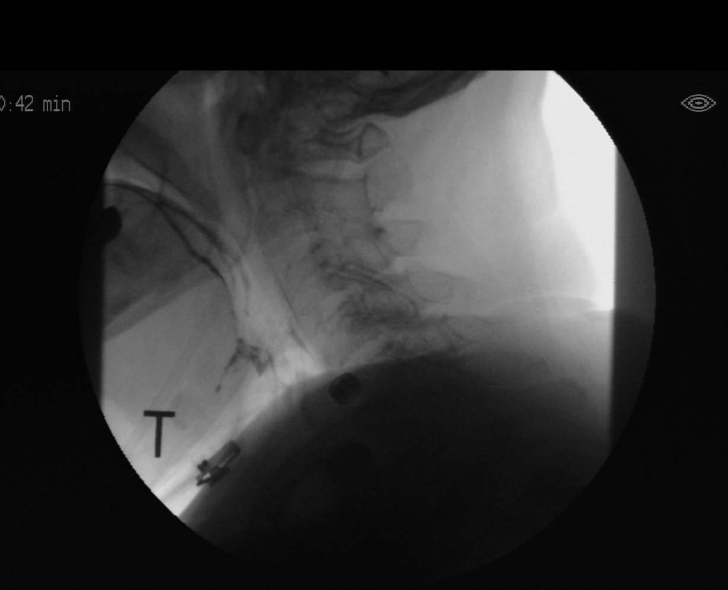

[Series 9: run · 1 of 339 frames shown (7 of 13)]
[frame 170/339]
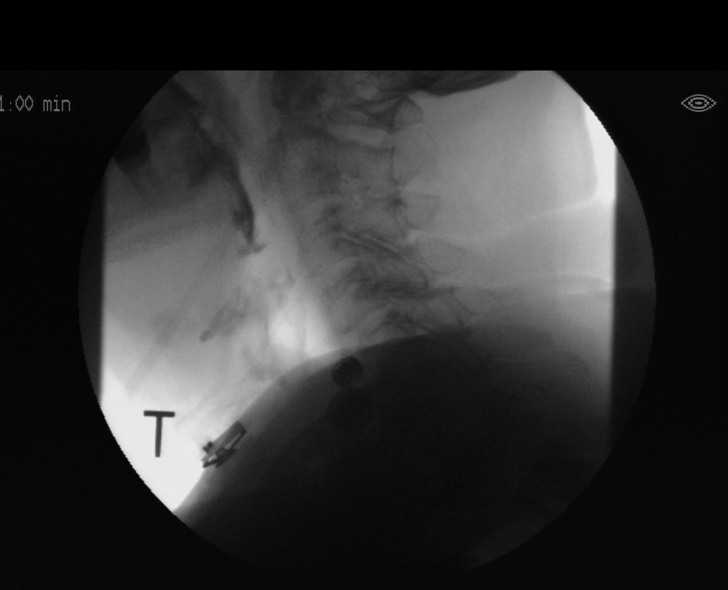

[Series 10: run · 1 of 439 frames shown (8 of 13)]
[frame 66/439]
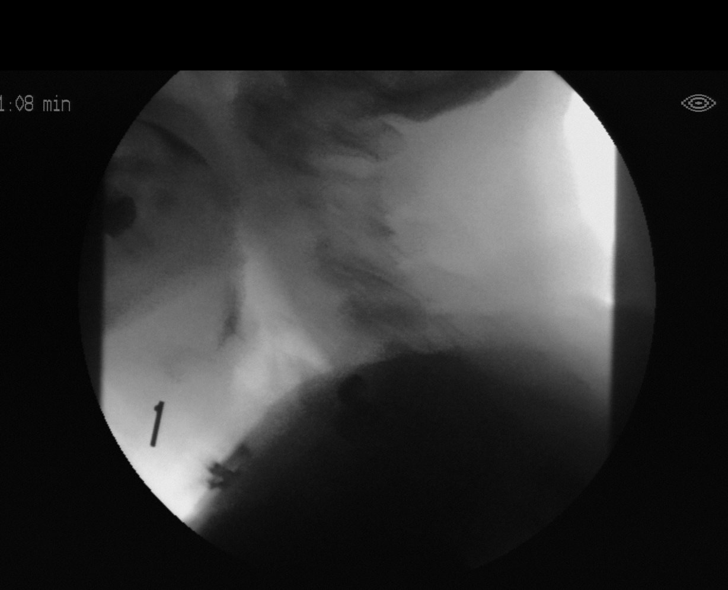

[Series 11: run · 1 of 332 frames shown (9 of 13)]
[frame 50/332]
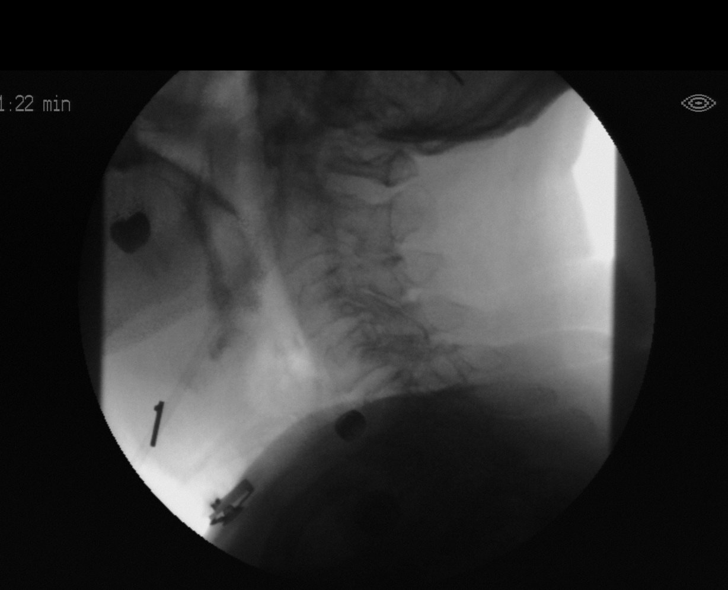

[Series 12: run · 1 of 236 frames shown (10 of 13)]
[frame 201/236]
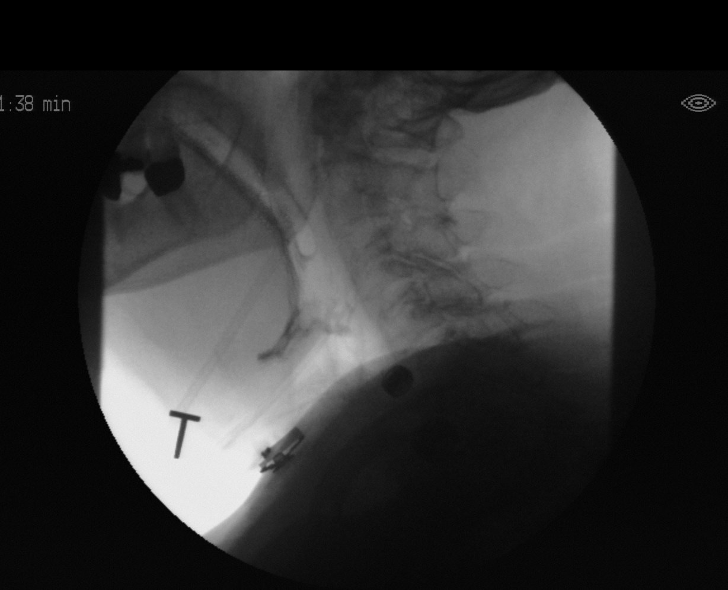

[Series 14: run · 1 of 583 frames shown (11 of 13)]
[frame 1/583]
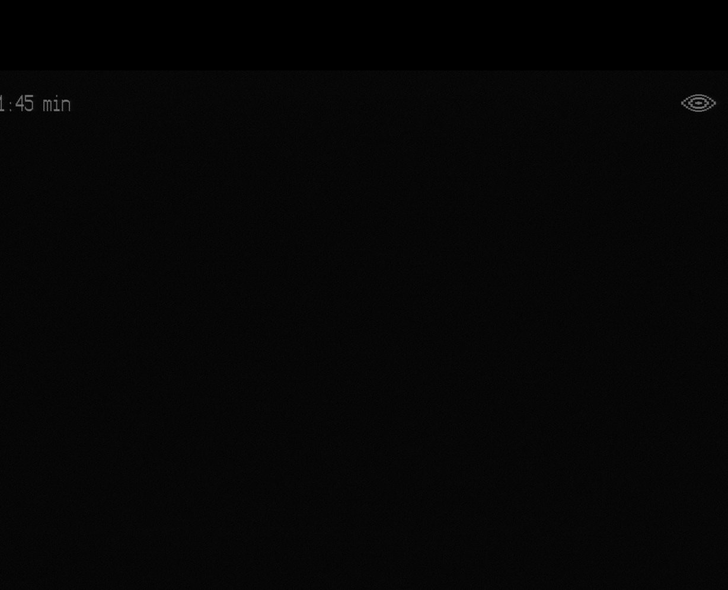

[Series 15: run · 1 of 460 frames shown (12 of 13)]
[frame 392/460]
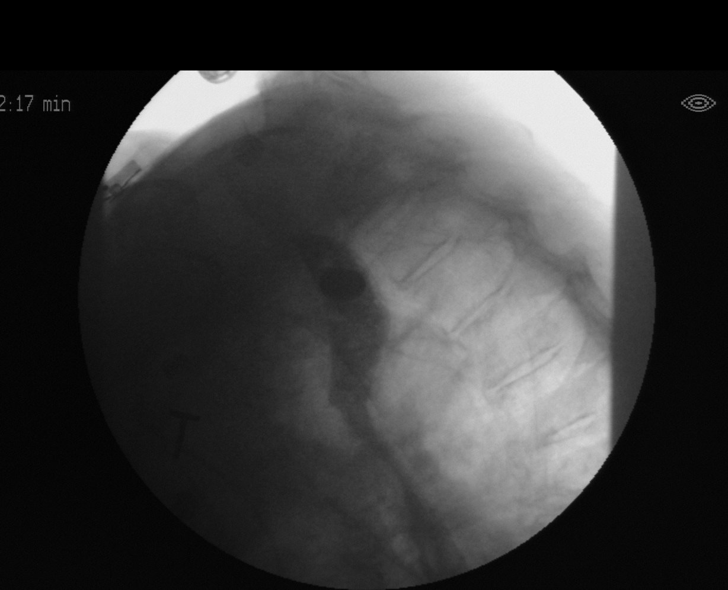

[Series 16: run · 1 of 431 frames shown (13 of 13)]
[frame 367/431]
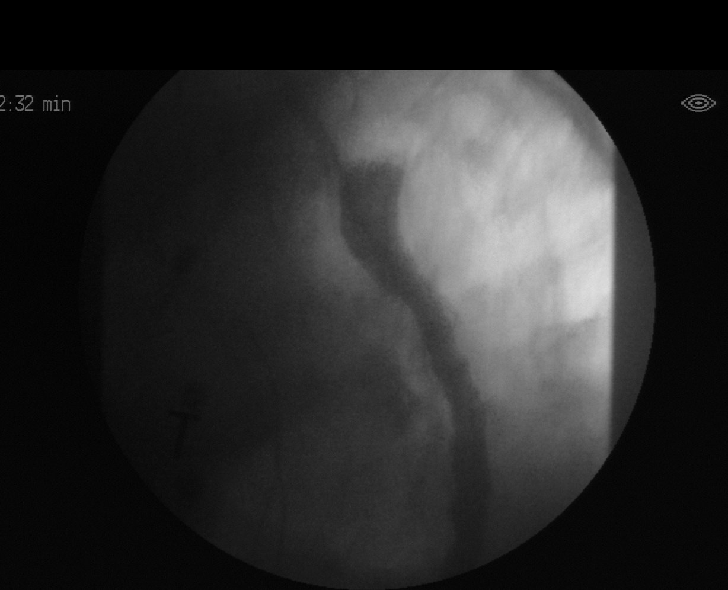

[13 of 24 positions shown; findings below may reference images not displayed]

FLUOROSCOPY FOR SWALLOWING FUNCTION STUDY:
Fluoroscopy was provided for swallowing function study, which was administered by a speech pathologist.  Final results and recommendations from this study are contained within the speech pathology report.

## 2019-04-14 IMAGING — CR DG CHEST 1V PORT
1 series · 1 of 1 positions shown · non-contrast
Comparison: 05/07/2018 chest radiograph.

CLINICAL DATA: Dyspnea

EXAM:
PORTABLE CHEST 1 VIEW

[AP]
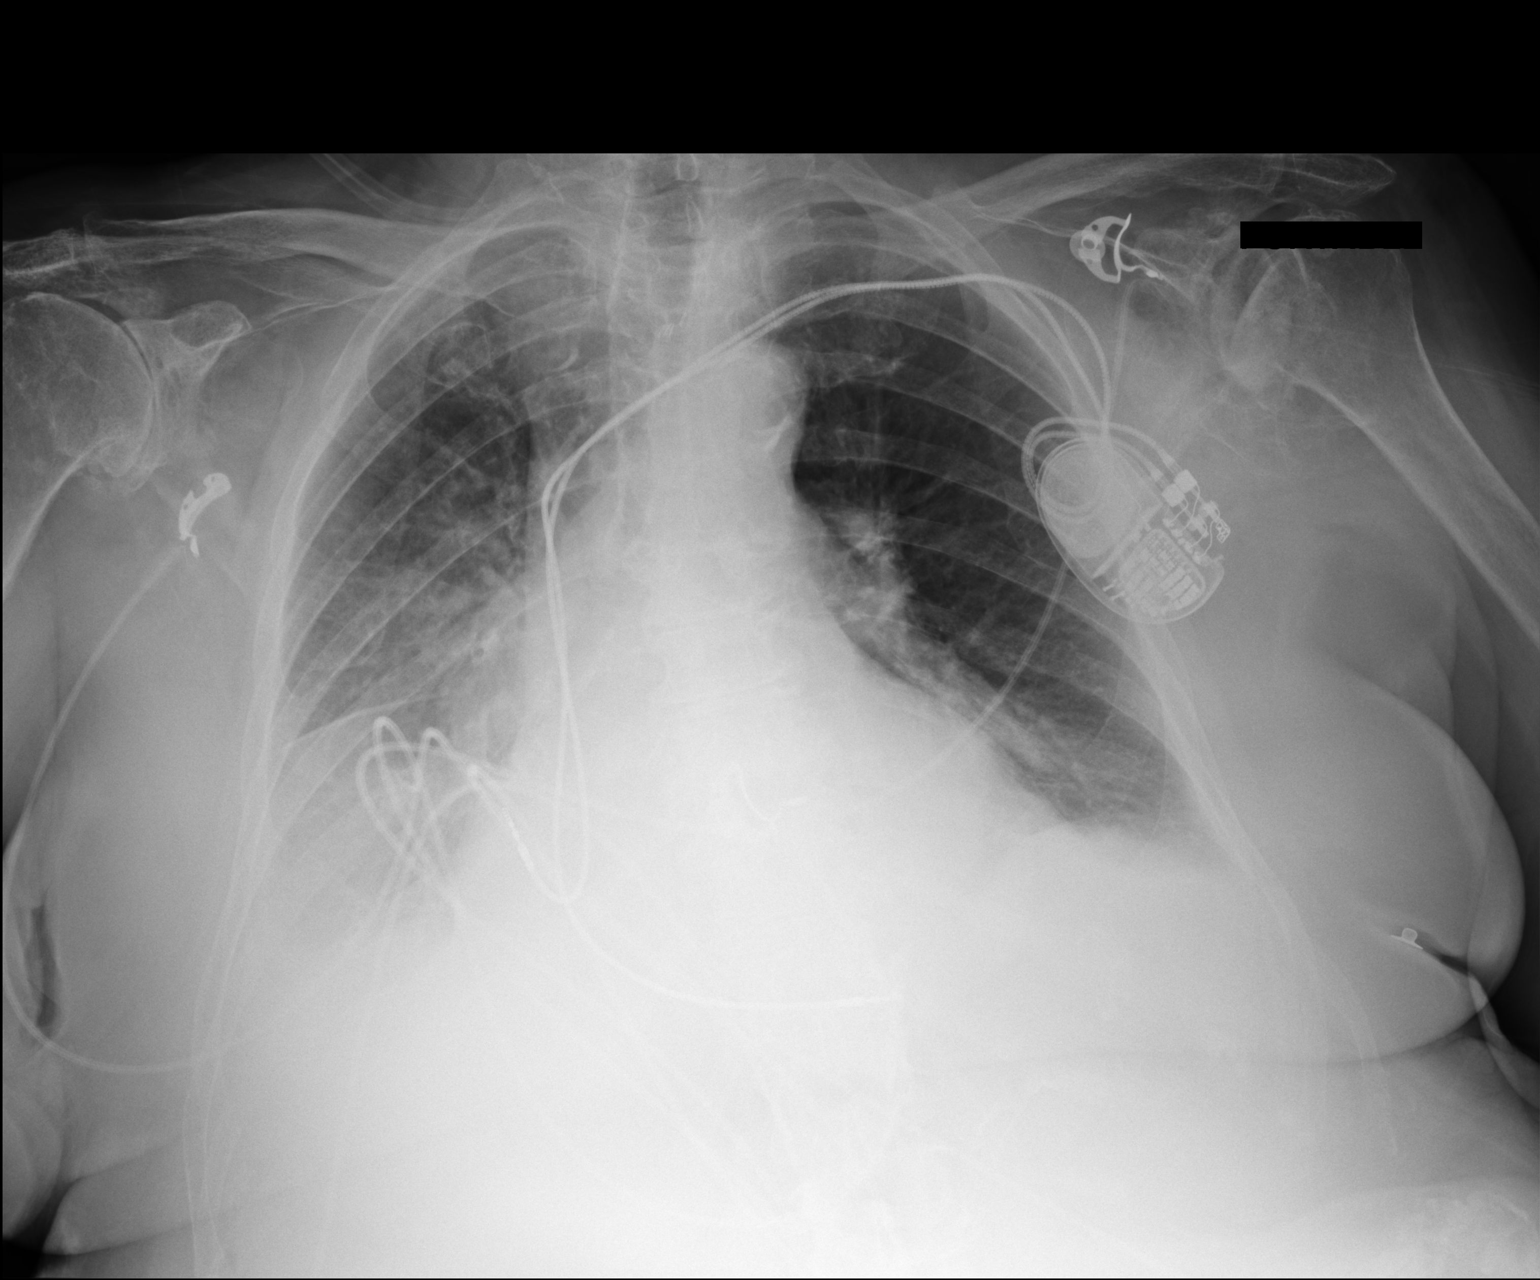

[1 of 1 positions shown; findings below may reference images not displayed]

FINDINGS: Stable 2 lead left subclavian pacemaker. Stable cardiomediastinal
silhouette with mild cardiomegaly. No pneumothorax. Small right
pleural effusion, slightly increased. Stable trace left pleural
effusion. Increased hazy right parahilar and right basilar lung
opacity. Stable mild left basilar atelectasis.
IMPRESSION: 1. Mild cardiomegaly. Increased hazy right parahilar/basilar lung
opacity, favor asymmetric mild pulmonary edema.
2. Small right pleural effusion, slightly increased. Stable trace
left pleural effusion.

## 2019-04-29 ENCOUNTER — Encounter (HOSPITAL_COMMUNITY): Payer: Self-pay | Admitting: Emergency Medicine

## 2019-04-29 ENCOUNTER — Emergency Department (HOSPITAL_COMMUNITY)
Admission: EM | Admit: 2019-04-29 | Discharge: 2019-04-30 | Disposition: A | Discharge status: Home / Self Care | Payer: Medicare Other | Attending: Emergency Medicine | Admitting: Emergency Medicine

## 2019-04-29 ENCOUNTER — Other Ambulatory Visit: Payer: Self-pay

## 2019-04-29 DIAGNOSIS — Z8673 Personal history of transient ischemic attack (TIA), and cerebral infarction without residual deficits: Secondary | ICD-10-CM | POA: Insufficient documentation

## 2019-04-29 DIAGNOSIS — Y998 Other external cause status: Secondary | ICD-10-CM | POA: Diagnosis not present

## 2019-04-29 DIAGNOSIS — T148XXA Other injury of unspecified body region, initial encounter: Secondary | ICD-10-CM

## 2019-04-29 DIAGNOSIS — M542 Cervicalgia: Secondary | ICD-10-CM | POA: Insufficient documentation

## 2019-04-29 DIAGNOSIS — Y929 Unspecified place or not applicable: Secondary | ICD-10-CM | POA: Insufficient documentation

## 2019-04-29 DIAGNOSIS — I5032 Chronic diastolic (congestive) heart failure: Secondary | ICD-10-CM | POA: Diagnosis not present

## 2019-04-29 DIAGNOSIS — N183 Chronic kidney disease, stage 3 (moderate): Secondary | ICD-10-CM | POA: Insufficient documentation

## 2019-04-29 DIAGNOSIS — Y9389 Activity, other specified: Secondary | ICD-10-CM | POA: Diagnosis not present

## 2019-04-29 DIAGNOSIS — E1140 Type 2 diabetes mellitus with diabetic neuropathy, unspecified: Secondary | ICD-10-CM | POA: Diagnosis not present

## 2019-04-29 DIAGNOSIS — S0990XA Unspecified injury of head, initial encounter: Secondary | ICD-10-CM | POA: Diagnosis present

## 2019-04-29 DIAGNOSIS — I2510 Atherosclerotic heart disease of native coronary artery without angina pectoris: Secondary | ICD-10-CM | POA: Diagnosis not present

## 2019-04-29 DIAGNOSIS — Z950 Presence of cardiac pacemaker: Secondary | ICD-10-CM | POA: Insufficient documentation

## 2019-04-29 DIAGNOSIS — I130 Hypertensive heart and chronic kidney disease with heart failure and stage 1 through stage 4 chronic kidney disease, or unspecified chronic kidney disease: Secondary | ICD-10-CM | POA: Insufficient documentation

## 2019-04-29 DIAGNOSIS — S0081XA Abrasion of other part of head, initial encounter: Secondary | ICD-10-CM | POA: Diagnosis not present

## 2019-04-29 DIAGNOSIS — I252 Old myocardial infarction: Secondary | ICD-10-CM | POA: Diagnosis not present

## 2019-04-29 DIAGNOSIS — Z9981 Dependence on supplemental oxygen: Secondary | ICD-10-CM | POA: Diagnosis not present

## 2019-04-29 DIAGNOSIS — M549 Dorsalgia, unspecified: Secondary | ICD-10-CM | POA: Diagnosis not present

## 2019-04-29 DIAGNOSIS — W01198A Fall on same level from slipping, tripping and stumbling with subsequent striking against other object, initial encounter: Secondary | ICD-10-CM | POA: Insufficient documentation

## 2019-04-29 DIAGNOSIS — E1122 Type 2 diabetes mellitus with diabetic chronic kidney disease: Secondary | ICD-10-CM | POA: Insufficient documentation

## 2019-04-29 DIAGNOSIS — W19XXXA Unspecified fall, initial encounter: Secondary | ICD-10-CM

## 2019-04-29 NOTE — ED Triage Notes (Signed)
Patient from [facility] senior living, she was reaching for remote and fell out of bed, hitting her head on floor and nose hit oxygen tank.  Patient is CAOx4, no LOC, full recall, GCS of 15.  Patient is on plavixx.

## 2019-04-30 ENCOUNTER — Emergency Department (HOSPITAL_COMMUNITY): Payer: Medicare Other

## 2019-04-30 DIAGNOSIS — S0081XA Abrasion of other part of head, initial encounter: Secondary | ICD-10-CM | POA: Diagnosis not present

## 2019-04-30 NOTE — ED Provider Notes (Signed)
Emergency Department Provider Note   I have reviewed the triage vital signs and the nursing notes.   HISTORY  Chief Complaint Fall   HPI Charlene Greene is a 85 y.o. female who presents the emergency department today with a fall.  Patient is alert and oriented x4 and states that she was reaching out of her bed to grab something and she fell hitting her nose on a oxygen tank and hitting the back of her head on something else and presents with just nose pain.  C-collar placed by EMS and brought here for further evaluation.  Patient is been complaining of arm pain neck pain back pain or other associated symptoms.   No other associated or modifying symptoms.    Past Medical History:  Diagnosis Date   CAD (coronary artery disease)    Diabetes mellitus without complication (HCC)    Hyperlipidemia    Hypertension    Pacemaker    Renal artery stenosis (HCC)    s/p bilateral stents   Sick sinus syndrome (HCC)    Stroke [facility](HCC)    2 previous cva's    Patient Active Problem List   Diagnosis Date Noted   Acute on chronic diastolic CHF (congestive heart failure) (HCC) 10/19/2018   Non-ST elevation (NSTEMI) myocardial infarction (HCC) 08/05/2018   Oxygen dependent 08/02/2018   Elevated troponin 08/02/2018   Pressure injury of skin 05/12/2018   HCAP (healthcare-associated pneumonia) 05/08/2018   Acute respiratory failure with hypoxia (HCC) 05/07/2018   Sleep disturbance    Elevated BUN    Labile blood pressure    Essential hypertension    Dysphasia, post-stroke    PAD (peripheral artery disease) (HCC)    Hypoalbuminemia due to protein-calorie malnutrition (HCC)    Diabetic peripheral neuropathy (HCC)    Diabetes mellitus type 2 in nonobese [facility](HCC)    Chronic diastolic congestive heart failure (HCC)    Left thalamic infarction (HCC) 02/26/2018   Dysphagia, post-stroke    Acute on chronic diastolic congestive heart failure (HCC)    Type 2 diabetes  mellitus with peripheral neuropathy (HCC)    TIA (transient ischemic attack) 02/22/2018   Labile blood glucose    Hypotension due to drugs    Hyponatremia    Renovascular hypertension    Hypertensive crisis    Type 2 diabetes mellitus with diabetic autonomic neuropathy, without long-term current use of insulin (HCC)    Stage 3 chronic kidney disease (HCC)    Acute lower UTI    Acute renal insufficiency 09/27/2016   Urinary urgency 09/27/2016   Basal ganglia infarction (HCC) 09/26/2016   Neuropathic pain    Benign essential HTN    Slow transit constipation    Elevated creatine kinase    Accelerated hypertension 09/24/2016   Diabetes mellitus (HCC) 09/24/2016   Hyperlipidemia 09/24/2016   Sick sinus syndrome (HCC) 09/24/2016   CVA (cerebral vascular accident) (HCC) 09/23/2016   Stroke (cerebrum) (HCC) 09/23/2016    Past Surgical History:  Procedure Laterality Date   PACEMAKER PLACEMENT  05/15/2014   MDT Advisa MRI implanted at [facility] for sick sinus syndrome    Current Outpatient Rx   Order #: [id number] Class: Historical Med   Order #: [id number] Class: Historical Med   Order #: [id number] Class: No Print   Order #: [id number] Class: Print   Order #: [id number] Class: Historical Med   Order #: [id number] Class: Print   Order #: [id number] Class: No Print   Order #: [id number] Class: No Print   Order #: [id number] Class: Print   Order #:  [id number] Class: Print   Order #: [id number] Class: Print   Order #: [id number] Class: Print   Order #: [id number] Class: Historical Med   Order #: [id number] Class: Print   Order #: [id number] Class: No Print   Order #: [id number] Class: Historical Med   Order #: [id number] Class: Print   Order #: [id number] Class: Print    Allergies Statins, Other, Amlodipine, Celecoxib, Codeine, Naproxen, and Piroxicam  Family History  Problem Relation Age of Onset   Lung cancer Sister    Heart failure Sister    Heart disease Sister     Vision loss Sister    Mental illness Sister    Hypertension Son    Vision loss Son    Vision loss Mother    Vision loss Father    Depression Father    Diabetes Paternal Aunt    Diabetes Maternal Aunt    CAD Neg Hx    Stroke Neg Hx     Social History Social History   Tobacco Use   Smoking status: Never Smoker   Smokeless tobacco: Never Used  Substance Use Topics   Alcohol use: No   Drug use: Never    Review of Systems  All other systems negative except as documented in the HPI. All pertinent positives and negatives as reviewed in the HPI. ____________________________________________   PHYSICAL EXAM:  VITAL SIGNS: ED Triage Vitals  Enc Vitals Group     BP 04/29/19 2349 (!) 144/52     Pulse Rate 04/29/19 2349 94     Resp 04/29/19 2349 17     Temp 04/29/19 2349 97.7 F (36.5 C)     Temp Source 04/29/19 2349 Oral     SpO2 04/29/19 2349 95 %    Constitutional: Alert and oriented. Well appearing and in no acute distress. Eyes: Conjunctivae are normal. PERRL. EOMI. Head: abrasion to right anterior nose, hematoma to posterior scalp. Nose: No obvious septal hematoma. Mouth/Throat: Mucous membranes are moist.  Oropharynx non-erythematous. Neck: No stridor.  No meningeal signs.   Cardiovascular: Normal rate, regular rhythm. Good peripheral circulation. Grossly normal heart sounds.   Respiratory: Normal respiratory effort.  No retractions. Lungs CTAB. Gastrointestinal: Soft and nontender. No distention.  Musculoskeletal: No cervical spine tenderness, thoracic spine tenderness or Lumbar spine tenderness.  No tenderness or pain with palpation and full ROM of all joints in upper and lower extremities.  No ecchymosis or other signs of trauma on back or extremities.  No Pain with AP or lateral compression of ribs.  No Paracervical ttp, paraspinal ttp Neurologic:  No altered mental status, able to give full seemingly accurate history.  Face is symmetric,  EOM's intact, pupils equal and reactive, vision intact, tongue and uvula midline without deviation. Upper and Lower extremity motor 5/5, intact pain perception in distal extremities, 2+ reflexes in biceps, patella and achilles tendons. Able to perform finger to nose normal with both hands. Walks at baseline without assistance or evident ataxia.  Skin:  Skin is warm, dry and intact. No rash noted.  ____________________________________________  RADIOLOGY  Ct Head Wo Contrast  Result Date: 04/30/2019 CLINICAL DATA:  85 y/o  F; fall from bed with head injury. EXAM: CT HEAD WITHOUT CONTRAST CT MAXILLOFACIAL WITHOUT CONTRAST TECHNIQUE: Multidetector CT imaging of the head and maxillofacial structures were performed using the standard protocol without intravenous contrast. Multiplanar CT image reconstructions of the maxillofacial structures were also generated. COMPARISON:  09/13/2018 CT of the head. FINDINGS: CT HEAD FINDINGS Brain: No evidence of acute infarction, hemorrhage, hydrocephalus, extra-axial collection or  mass lesion/mass effect. Small chronic infarct in right anterior basal ganglia. Stable chronic microvascular ischemic changes and volume loss of the brain. Vascular: Calcific atherosclerosis of internal carotid arteries and vertebral arteries. Skull: Small scalp contusions are present in the right parietal and right frontal regions. Other: Bilateral intra-ocular lens replacement. CT MAXILLOFACIAL FINDINGS Osseous: No fracture or mandibular dislocation. No destructive process. Orbits: Negative. No traumatic or inflammatory finding. Sinuses: Clear. Soft tissues: Negative. IMPRESSION: 1. Small scalp contusions in the right parietal and right frontal regions. 2. No acute intracranial abnormality or calvarial fracture. 3. No acute facial fracture or mandibular dislocation. No traumatic finding of the orbits. Electronically Signed   By: Kristine Garbe M.D.   On: 04/30/2019 00:55   Ct  Maxillofacial Wo Contrast  Result Date: 04/30/2019 CLINICAL DATA:  85 y/o  F; fall from bed with head injury. EXAM: CT HEAD WITHOUT CONTRAST CT MAXILLOFACIAL WITHOUT CONTRAST TECHNIQUE: Multidetector CT imaging of the head and maxillofacial structures were performed using the standard protocol without intravenous contrast. Multiplanar CT image reconstructions of the maxillofacial structures were also generated. COMPARISON:  09/13/2018 CT of the head. FINDINGS: CT HEAD FINDINGS Brain: No evidence of acute infarction, hemorrhage, hydrocephalus, extra-axial collection or mass lesion/mass effect. Small chronic infarct in right anterior basal ganglia. Stable chronic microvascular ischemic changes and volume loss of the brain. Vascular: Calcific atherosclerosis of internal carotid arteries and vertebral arteries. Skull: Small scalp contusions are present in the right parietal and right frontal regions. Other: Bilateral intra-ocular lens replacement. CT MAXILLOFACIAL FINDINGS Osseous: No fracture or mandibular dislocation. No destructive process. Orbits: Negative. No traumatic or inflammatory finding. Sinuses: Clear. Soft tissues: Negative. IMPRESSION: 1. Small scalp contusions in the right parietal and right frontal regions. 2. No acute intracranial abnormality or calvarial fracture. 3. No acute facial fracture or mandibular dislocation. No traumatic finding of the orbits. Electronically Signed   By: Kristine Garbe M.D.   On: 04/30/2019 00:55    ____________________________________________    INITIAL IMPRESSION / ASSESSMENT AND PLAN / ED COURSE  Patient alert and oriented x4 but is on Plavix with evidence of head trauma.  No syncope patient is able to describe exactly what happened to me.  C-collar cleared via [facility] however with the Plavix, nasal swelling and abrasion, hematoma the back of her head will do imaging of her head and face but suspect these are to be negative she can likely be discharged  home as long she is able to ambulate without any obvious pain.  Work-up negative.  Patient is able ambulate with her walker near her baseline.  Patient will be discharged back to facility.  Pertinent labs & imaging results that were available during my care of the patient were reviewed by me and considered in my medical decision making (see chart for details).  A medical screening exam was performed and I feel the patient has had an appropriate workup for their chief complaint at this time and likelihood of emergent condition existing is low. They have been counseled on decision, discharge, follow up and which symptoms necessitate immediate return to the emergency department. They or their family verbally stated understanding and agreement with plan and discharged in stable condition.   ____________________________________________  FINAL CLINICAL IMPRESSION(S) / ED DIAGNOSES  Final diagnoses:  Fall, initial encounter  Abrasion     MEDICATIONS GIVEN DURING THIS VISIT:  Medications - No data to display   NEW OUTPATIENT MEDICATIONS STARTED DURING THIS VISIT:  Discharge Medication List as of 04/30/2019  3:15 AM      Note:  This note was prepared with assistance of Dragon voice recognition software. Occasional wrong-word or sound-a-like substitutions may have occurred due to the inherent limitations of voice recognition software.   Keymoni Mccaster, Barbara CowerJason, MD 04/30/19 [phone]

## 2019-04-30 NOTE — ED Notes (Signed)
Amb. Pt. In hall w/ walker with assistance , tolerated well, was unstable with direction while walking

## 2019-04-30 NOTE — ED Notes (Signed)
Called [facility] to transport patient back to [facility]

## 2019-04-30 NOTE — ED Notes (Signed)
Patient verbalizes understanding of discharge instructions. Opportunity for questioning and answers were provided. Armband removed by staff, pt discharged from ED. Pt awaiting for [facility] for transfer back to [facility] facility

## 2019-05-31 ENCOUNTER — Encounter (HOSPITAL_COMMUNITY): Payer: Self-pay

## 2019-05-31 ENCOUNTER — Other Ambulatory Visit: Payer: Self-pay

## 2019-05-31 ENCOUNTER — Emergency Department (HOSPITAL_COMMUNITY)
Admission: EM | Admit: 2019-05-31 | Discharge: 2019-05-31 | Disposition: A | Discharge status: Home / Self Care | Payer: Medicare Other | Source: Home / Self Care | Attending: Emergency Medicine | Admitting: Emergency Medicine

## 2019-05-31 DIAGNOSIS — Z8673 Personal history of transient ischemic attack (TIA), and cerebral infarction without residual deficits: Secondary | ICD-10-CM | POA: Insufficient documentation

## 2019-05-31 DIAGNOSIS — I2510 Atherosclerotic heart disease of native coronary artery without angina pectoris: Secondary | ICD-10-CM | POA: Insufficient documentation

## 2019-05-31 DIAGNOSIS — E1152 Type 2 diabetes mellitus with diabetic peripheral angiopathy with gangrene: Secondary | ICD-10-CM | POA: Diagnosis not present

## 2019-05-31 DIAGNOSIS — E860 Dehydration: Secondary | ICD-10-CM | POA: Insufficient documentation

## 2019-05-31 DIAGNOSIS — R531 Weakness: Secondary | ICD-10-CM

## 2019-05-31 DIAGNOSIS — Z7982 Long term (current) use of aspirin: Secondary | ICD-10-CM | POA: Insufficient documentation

## 2019-05-31 DIAGNOSIS — L98421 Non-pressure chronic ulcer of back limited to breakdown of skin: Secondary | ICD-10-CM

## 2019-05-31 DIAGNOSIS — Z7901 Long term (current) use of anticoagulants: Secondary | ICD-10-CM | POA: Insufficient documentation

## 2019-05-31 DIAGNOSIS — L89159 Pressure ulcer of sacral region, unspecified stage: Secondary | ICD-10-CM | POA: Insufficient documentation

## 2019-05-31 DIAGNOSIS — E119 Type 2 diabetes mellitus without complications: Secondary | ICD-10-CM | POA: Insufficient documentation

## 2019-05-31 DIAGNOSIS — Z950 Presence of cardiac pacemaker: Secondary | ICD-10-CM | POA: Insufficient documentation

## 2019-05-31 DIAGNOSIS — I10 Essential (primary) hypertension: Secondary | ICD-10-CM | POA: Insufficient documentation

## 2019-05-31 DIAGNOSIS — L03116 Cellulitis of left lower limb: Secondary | ICD-10-CM | POA: Diagnosis not present

## 2019-05-31 LAB — CBC WITH DIFFERENTIAL/PLATELET
Abs Immature Granulocytes: 0.33 10*3/uL — ABNORMAL HIGH (ref 0.00–0.07)
Basophils Absolute: 0.1 10*3/uL (ref 0.0–0.1)
Basophils Relative: 0 %
Eosinophils Absolute: 0.1 10*3/uL (ref 0.0–0.5)
Eosinophils Relative: 1 %
HCT: 31.5 % — ABNORMAL LOW (ref 36.0–46.0)
Hemoglobin: 10.1 g/dL — ABNORMAL LOW (ref 12.0–15.0)
Immature Granulocytes: 2 %
Lymphocytes Relative: 10 %
Lymphs Abs: 1.4 10*3/uL (ref 0.7–4.0)
MCH: 30.1 pg (ref 26.0–34.0)
MCHC: 32.1 g/dL (ref 30.0–36.0)
MCV: 94 fL (ref 80.0–100.0)
Monocytes Absolute: 0.7 10*3/uL (ref 0.1–1.0)
Monocytes Relative: 5 %
Neutro Abs: 11 10*3/uL — ABNORMAL HIGH (ref 1.7–7.7)
Neutrophils Relative %: 82 %
Platelets: 343 10*3/uL (ref 150–400)
RBC: 3.35 MIL/uL — ABNORMAL LOW (ref 3.87–5.11)
RDW: 13.7 % (ref 11.5–15.5)
WBC: 13.5 10*3/uL — ABNORMAL HIGH (ref 4.0–10.5)
nRBC: 0.1 % (ref 0.0–0.2)

## 2019-05-31 LAB — BASIC METABOLIC PANEL
Anion gap: 13 (ref 5–15)
BUN: 27 mg/dL — ABNORMAL HIGH (ref 8–23)
CO2: 32 mmol/L (ref 22–32)
Calcium: 8.8 mg/dL — ABNORMAL LOW (ref 8.9–10.3)
Chloride: 92 mmol/L — ABNORMAL LOW (ref 98–111)
Creatinine, Ser: 1.13 mg/dL — ABNORMAL HIGH (ref 0.44–1.00)
GFR calc Af Amer: 48 mL/min — ABNORMAL LOW (ref >60)
GFR calc non Af Amer: 42 mL/min — ABNORMAL LOW (ref >60)
Glucose, Bld: 144 mg/dL — ABNORMAL HIGH (ref 70–99)
Potassium: 4.6 mmol/L (ref 3.5–5.1)
Sodium: 137 mmol/L (ref 135–145)

## 2019-05-31 LAB — URINALYSIS, ROUTINE W REFLEX MICROSCOPIC
Bilirubin Urine: NEGATIVE
Glucose, UA: NEGATIVE mg/dL
Hgb urine dipstick: NEGATIVE
Ketones, ur: NEGATIVE mg/dL
Leukocytes,Ua: NEGATIVE
Nitrite: NEGATIVE
Protein, ur: NEGATIVE mg/dL
Specific Gravity, Urine: 1.009 (ref 1.005–1.030)
pH: 6 (ref 5.0–8.0)

## 2019-05-31 MED ORDER — SODIUM CHLORIDE 0.9 % IV BOLUS
500.0000 mL | Freq: Once | INTRAVENOUS | Status: AC
  Administered 2019-05-31: 500 mL via INTRAVENOUS

## 2019-05-31 NOTE — ED Provider Notes (Signed)
[facility]-EMERGENCY DEPT Provider Note   CSN: [id number] Arrival date & time: 05/31/19  1210     History   Chief Complaint Chief Complaint  Patient presents with  . Tremors    HPI Charlene Greene is a 85 y.o. female.     Patient is a 85 year old female with past medical history of coronary artery disease, diabetes, hypertension, pacemaker placement, hyperlipidemia.  She is currently a [facility].  She was sent here for evaluation of "not feeling well".  She describes "pain all over" especially in her feet.  She denies any fevers or chills.  She denies any cough or urinary complaints.  The history is provided by the patient.    Past Medical History:  Diagnosis Date  . CAD (coronary artery disease)   . Diabetes mellitus without complication (HCC)   . Hyperlipidemia   . Hypertension   . Pacemaker   . Renal artery stenosis (HCC)    s/p bilateral stents  . Sick sinus syndrome (HCC)   . Stroke [facility](HCC)    2 previous cva's    Patient Active Problem List   Diagnosis Date Noted  . Acute on chronic diastolic CHF (congestive heart failure) (HCC) 10/19/2018  . Non-ST elevation (NSTEMI) myocardial infarction (HCC) 08/05/2018  . Oxygen dependent 08/02/2018  . Elevated troponin 08/02/2018  . Pressure injury of skin 05/12/2018  . HCAP (healthcare-associated pneumonia) 05/08/2018  . Acute respiratory failure with hypoxia (HCC) 05/07/2018  . Sleep disturbance   . Elevated BUN   . Labile blood pressure   . Essential hypertension   . Dysphasia, post-stroke   . PAD (peripheral artery disease) (HCC)   . Hypoalbuminemia due to protein-calorie malnutrition (HCC)   . Diabetic peripheral neuropathy (HCC)   . Diabetes mellitus type 2 in nonobese (HCC)   . Chronic diastolic congestive heart failure (HCC)   . Left thalamic infarction (HCC) 02/26/2018  . Dysphagia, post-stroke   . Acute on chronic diastolic congestive heart failure  (HCC)   . Type 2 diabetes mellitus with peripheral neuropathy (HCC)   . TIA (transient ischemic attack) 02/22/2018  . Labile blood glucose   . Hypotension due to drugs   . Hyponatremia   . Renovascular hypertension   . Hypertensive crisis   . Type 2 diabetes mellitus with diabetic autonomic neuropathy, without long-term current use of insulin (HCC)   . Stage 3 chronic kidney disease (HCC)   . Acute lower UTI   . Acute renal insufficiency 09/27/2016  . Urinary urgency 09/27/2016  . Basal ganglia infarction (HCC) 09/26/2016  . Neuropathic pain   . Benign essential HTN   . Slow transit constipation   . Elevated creatine kinase   . Accelerated hypertension 09/24/2016  . Diabetes mellitus (HCC) 09/24/2016  . Hyperlipidemia 09/24/2016  . Sick sinus syndrome (HCC) 09/24/2016  . CVA (cerebral vascular accident) (HCC) 09/23/2016  . Stroke (cerebrum) (HCC) 09/23/2016    Past Surgical History:  Procedure Laterality Date  . PACEMAKER PLACEMENT  05/15/2014   MDT Advisa MRI implanted at [facility] for sick sinus syndrome     OB History    Gravida      Para      Term      Preterm      AB      Living  2     SAB      TAB      Ectopic      Multiple  Live Births               Home Medications    Prior to Admission medications   Medication Sig Start Date End Date Taking? Authorizing Provider  amLODipine (NORVASC) 10 MG tablet Take 1 tablet (10 mg total) by mouth 2 (two) times daily. 10mg  in the am and 5mg  in the pm Patient taking differently: Take 10 mg by mouth daily.  08/18/18  Yes Lendon Colonel, NP  glimepiride (AMARYL) 1 MG tablet Take 1 tablet (1 mg total) by mouth daily with breakfast. 03/13/18  Yes Angiulli, Lavon Paganini, PA-C  acetaminophen (TYLENOL) 500 MG tablet Take 500 mg by mouth 2 (two) times daily.     [provider]  acetaminophen (TYLENOL) 500 MG tablet Take 500 mg by mouth every 12 (twelve) hours as needed for mild pain.    [provider]  amLODipine (NORVASC) 5 MG tablet Take 5 mg by mouth at bedtime. 03/30/19   [provider]  aspirin 81 MG chewable tablet Chew 1 tablet (81 mg total) by mouth daily. With Food 08/08/18   Roxan Hockey, MD  cholecalciferol (VITAMIN D3) 25 MCG (1000 UT) tablet Take 1,000 Units by mouth daily.    [provider]  clopidogrel (PLAVIX) 75 MG tablet Take 1 tablet (75 mg total) by mouth daily. 03/13/18   Angiulli, Lavon Paganini, PA-C  dextromethorphan-guaiFENesin (MUCINEX DM) 30-600 MG 12hr tablet Take 1 tablet by mouth 2 (two) times daily. 05/13/18   Desiree Hane, MD  feeding supplement, ENSURE ENLIVE, (ENSURE ENLIVE) LIQD Take 237 mLs by mouth 2 (two) times daily between meals. 05/14/18   Desiree Hane, MD  furosemide (LASIX) 40 MG tablet Take 80mg  in Am and 40mg  in PM. 10/22/18   Bonnielee Haff, MD  isosorbide mononitrate (IMDUR) 60 MG 24 hr tablet Take 1 tablet (60 mg total) by mouth daily. 10/11/16   Angiulli, Lavon Paganini, PA-C  lisinopril (PRINIVIL,ZESTRIL) 20 MG tablet Take 1 tablet (20 mg total) by mouth 2 (two) times daily. 03/13/18   Angiulli, Lavon Paganini, PA-C  Magnesium 250 MG TABS Take 250 mg by mouth at bedtime.    [provider]  metoprolol succinate (TOPROL-XL) 100 MG 24 hr tablet Take 1 tablet (100 mg total) by mouth daily. 03/13/18   Angiulli, Lavon Paganini, PA-C  Multiple Vitamin (MULTIVITAMIN WITH MINERALS) TABS tablet Take 1 tablet by mouth daily. 05/14/18   Desiree Hane, MD  nitroGLYCERIN (NITROSTAT) 0.4 MG SL tablet Place 0.4 mg under the tongue every 5 (five) minutes x 3 doses as needed for chest pain (and call 9-1-1 and MD, if no relief).     [provider]  ranolazine (RANEXA) 500 MG 12 hr tablet Take 1 tablet (500 mg total) by mouth 2 (two) times daily. 10/11/16   Angiulli, Lavon Paganini, PA-C  senna (SENOKOT) 8.6 MG TABS tablet Take 2 tablets (17.2 mg total) by mouth at bedtime. Patient taking differently: Take 2 tablets by mouth every Monday,  Wednesday, and Friday at 8 PM.  08/07/18   Roxan Hockey, MD    Family History Family History  Problem Relation Age of Onset  . Lung cancer Sister   . Heart failure Sister   . Heart disease Sister   . Vision loss Sister   . Mental illness Sister   . Hypertension Son   . Vision loss Son   . Vision loss Mother   . Vision loss Father   . Depression Father   .  Diabetes Paternal Aunt   . Diabetes Maternal Aunt   . CAD Neg Hx   . Stroke Neg Hx     Social History Social History   Tobacco Use  . Smoking status: Never Smoker  . Smokeless tobacco: Never Used  Substance Use Topics  . Alcohol use: No  . Drug use: Never     Allergies   Statins, Other, Amlodipine, Celecoxib, Codeine, Naproxen, and Piroxicam   Review of Systems Review of Systems  All other systems reviewed and are negative.    Physical Exam Updated Vital Signs BP (!) 128/57   Pulse 62   Temp 97.7 F (36.5 C) (Oral)   Resp 14   SpO2 94%   Physical Exam Vitals signs and nursing note reviewed.  Constitutional:      General: She is not in acute distress.    Appearance: She is well-developed. She is not diaphoretic.  HENT:     Head: Normocephalic and atraumatic.     Mouth/Throat:     Mouth: Mucous membranes are moist.  Neck:     Musculoskeletal: Normal range of motion and neck supple.  Cardiovascular:     Rate and Rhythm: Normal rate and regular rhythm.     Heart sounds: No murmur. No friction rub. No gallop.   Pulmonary:     Effort: Pulmonary effort is normal. No respiratory distress.     Breath sounds: Normal breath sounds. No wheezing.  Abdominal:     General: Bowel sounds are normal. There is no distension.     Palpations: Abdomen is soft.     Tenderness: There is no abdominal tenderness.  Musculoskeletal: Normal range of motion.        General: No swelling or tenderness.     Right lower leg: No edema.     Left lower leg: No edema.  Skin:    General: Skin is warm and dry.   Neurological:     Mental Status: She is alert and oriented to person, place, and time.      ED Treatments / Results  Labs (all labs ordered are listed, but only abnormal results are displayed) Labs Reviewed  BASIC METABOLIC PANEL  CBC WITH DIFFERENTIAL/PLATELET  URINALYSIS, ROUTINE W REFLEX MICROSCOPIC    EKG None  Radiology No results found.  Procedures Procedures (including critical care time)  Medications Ordered in ED Medications  sodium chloride 0.9 % bolus 500 mL (has no administration in time range)     Initial Impression / Assessment and Plan / ED Course  I have reviewed the triage vital signs and the nursing notes.  Pertinent labs & imaging results that were available during my care of the patient were reviewed by me and considered in my medical decision making (see chart for details).  Patient sent here from her extended care facility for evaluation of generalized weakness and sore to the sacral region.  Patient's work-up reveals a mild leukocytosis with no evidence for UTI or other obvious signs of infection.  She is afebrile and appears clinically well.  Patient does have a small amount of breakdown to the sacral region, however there is no significant erythema, purulent drainage, or tenderness.  The breakdown seems superficial.  Patient does have electrolytes reflective of mild dehydration.  She was given IV fluids here.  At this point, I feel as of the patient's major issues today are deconditioning as the son says that she does little more than sit in her chair all day.  It also  appears as though she is eating and drinking less and this is reflected by perhaps mild dehydration in her electrolytes.  At this point, I feel as though she is appropriate for discharge.  I see no indication for inpatient treatment.  I will advised the patient to be more active, increase her intake of food and liquid, and return to the emergency department for any problems.  I have  spoken with the patient's son, Jillyn HiddenGary and Veronda PrudeGary's wife regarding her presentation and results of her work-up.  They have asked me to evaluate her foot as they are concerned about a blister.  The patient does have a blister to the medial aspect of the heel, however there is no redness, tenderness, or significant skin breakdown.  I do not feel as though any specific treatment is necessary for this either.  At this point, patient to be discharged to her extended care facility.  Final Clinical Impressions(s) / ED Diagnoses   Final diagnoses:  None    ED Discharge Orders    None       Geoffery Lyonselo, Jannelle Notaro, MD 05/31/19 2032

## 2019-05-31 NOTE — Discharge Instructions (Addendum)
Increase your physical activity and intake of both food and liquid.  Patient needs to be turned and ambulated frequently to avoid further breakdown of her sacral region.  Return to the emergency department for high fever, severe pain, difficulty breathing, or other new and concerning symptoms.

## 2019-05-31 NOTE — ED Triage Notes (Signed)
EMS from [facility], Pt states she has been having tremors x 1 week.  BP 142/70 HR 90 RR 18 Sp02 96 RA CBG 218 Temp 96.5

## 2019-05-31 NOTE — ED Notes (Signed)
[facility] contacted and paperwork printed  

## 2019-06-03 ENCOUNTER — Inpatient Hospital Stay (HOSPITAL_COMMUNITY)
Admission: EM | Admit: 2019-06-03 | Discharge: 2019-06-05 | DRG: 300 | Disposition: A | Discharge status: Hospice | Payer: Medicare Other | Attending: Internal Medicine | Admitting: Internal Medicine

## 2019-06-03 ENCOUNTER — Emergency Department (HOSPITAL_COMMUNITY): Payer: Medicare Other

## 2019-06-03 ENCOUNTER — Encounter (HOSPITAL_COMMUNITY): Payer: Self-pay | Admitting: Emergency Medicine

## 2019-06-03 ENCOUNTER — Other Ambulatory Visit: Payer: Self-pay

## 2019-06-03 DIAGNOSIS — I5032 Chronic diastolic (congestive) heart failure: Secondary | ICD-10-CM | POA: Diagnosis present

## 2019-06-03 DIAGNOSIS — E782 Mixed hyperlipidemia: Secondary | ICD-10-CM | POA: Diagnosis not present

## 2019-06-03 DIAGNOSIS — I69354 Hemiplegia and hemiparesis following cerebral infarction affecting left non-dominant side: Secondary | ICD-10-CM | POA: Diagnosis not present

## 2019-06-03 DIAGNOSIS — I130 Hypertensive heart and chronic kidney disease with heart failure and stage 1 through stage 4 chronic kidney disease, or unspecified chronic kidney disease: Secondary | ICD-10-CM | POA: Diagnosis present

## 2019-06-03 DIAGNOSIS — E1169 Type 2 diabetes mellitus with other specified complication: Secondary | ICD-10-CM | POA: Diagnosis not present

## 2019-06-03 DIAGNOSIS — L03116 Cellulitis of left lower limb: Secondary | ICD-10-CM | POA: Diagnosis present

## 2019-06-03 DIAGNOSIS — Z515 Encounter for palliative care: Secondary | ICD-10-CM

## 2019-06-03 DIAGNOSIS — Z888 Allergy status to other drugs, medicaments and biological substances status: Secondary | ICD-10-CM | POA: Diagnosis not present

## 2019-06-03 DIAGNOSIS — I440 Atrioventricular block, first degree: Secondary | ICD-10-CM | POA: Diagnosis present

## 2019-06-03 DIAGNOSIS — I10 Essential (primary) hypertension: Secondary | ICD-10-CM | POA: Diagnosis not present

## 2019-06-03 DIAGNOSIS — E1122 Type 2 diabetes mellitus with diabetic chronic kidney disease: Secondary | ICD-10-CM | POA: Diagnosis present

## 2019-06-03 DIAGNOSIS — E1152 Type 2 diabetes mellitus with diabetic peripheral angiopathy with gangrene: Principal | ICD-10-CM | POA: Diagnosis present

## 2019-06-03 DIAGNOSIS — I739 Peripheral vascular disease, unspecified: Secondary | ICD-10-CM | POA: Diagnosis not present

## 2019-06-03 DIAGNOSIS — E119 Type 2 diabetes mellitus without complications: Secondary | ICD-10-CM

## 2019-06-03 DIAGNOSIS — Z7982 Long term (current) use of aspirin: Secondary | ICD-10-CM | POA: Diagnosis not present

## 2019-06-03 DIAGNOSIS — I2510 Atherosclerotic heart disease of native coronary artery without angina pectoris: Secondary | ICD-10-CM | POA: Diagnosis present

## 2019-06-03 DIAGNOSIS — Z1159 Encounter for screening for other viral diseases: Secondary | ICD-10-CM | POA: Diagnosis not present

## 2019-06-03 DIAGNOSIS — M869 Osteomyelitis, unspecified: Secondary | ICD-10-CM

## 2019-06-03 DIAGNOSIS — Z7902 Long term (current) use of antithrombotics/antiplatelets: Secondary | ICD-10-CM | POA: Diagnosis not present

## 2019-06-03 DIAGNOSIS — E1149 Type 2 diabetes mellitus with other diabetic neurological complication: Secondary | ICD-10-CM | POA: Diagnosis not present

## 2019-06-03 DIAGNOSIS — E785 Hyperlipidemia, unspecified: Secondary | ICD-10-CM | POA: Diagnosis present

## 2019-06-03 DIAGNOSIS — Z9981 Dependence on supplemental oxygen: Secondary | ICD-10-CM | POA: Diagnosis not present

## 2019-06-03 DIAGNOSIS — Z66 Do not resuscitate: Secondary | ICD-10-CM | POA: Diagnosis present

## 2019-06-03 DIAGNOSIS — Z950 Presence of cardiac pacemaker: Secondary | ICD-10-CM | POA: Diagnosis not present

## 2019-06-03 DIAGNOSIS — Z8673 Personal history of transient ischemic attack (TIA), and cerebral infarction without residual deficits: Secondary | ICD-10-CM | POA: Diagnosis not present

## 2019-06-03 DIAGNOSIS — I70209 Unspecified atherosclerosis of native arteries of extremities, unspecified extremity: Secondary | ICD-10-CM | POA: Diagnosis present

## 2019-06-03 DIAGNOSIS — N183 Chronic kidney disease, stage 3 unspecified: Secondary | ICD-10-CM | POA: Diagnosis present

## 2019-06-03 DIAGNOSIS — Z7189 Other specified counseling: Secondary | ICD-10-CM | POA: Diagnosis not present

## 2019-06-03 DIAGNOSIS — L89151 Pressure ulcer of sacral region, stage 1: Secondary | ICD-10-CM | POA: Diagnosis not present

## 2019-06-03 DIAGNOSIS — Z8249 Family history of ischemic heart disease and other diseases of the circulatory system: Secondary | ICD-10-CM

## 2019-06-03 DIAGNOSIS — Z79899 Other long term (current) drug therapy: Secondary | ICD-10-CM | POA: Diagnosis not present

## 2019-06-03 DIAGNOSIS — I96 Gangrene, not elsewhere classified: Secondary | ICD-10-CM | POA: Diagnosis present

## 2019-06-03 DIAGNOSIS — Z885 Allergy status to narcotic agent status: Secondary | ICD-10-CM

## 2019-06-03 LAB — PROTIME-INR
INR: 1.1 (ref 0.8–1.2)
Prothrombin Time: 13.6 seconds (ref 11.4–15.2)

## 2019-06-03 LAB — CBC WITH DIFFERENTIAL/PLATELET
Abs Immature Granulocytes: 0.24 10*3/uL — ABNORMAL HIGH (ref 0.00–0.07)
Basophils Absolute: 0 10*3/uL (ref 0.0–0.1)
Basophils Relative: 0 %
Eosinophils Absolute: 0 10*3/uL (ref 0.0–0.5)
Eosinophils Relative: 0 %
HCT: 28.6 % — ABNORMAL LOW (ref 36.0–46.0)
Hemoglobin: 9 g/dL — ABNORMAL LOW (ref 12.0–15.0)
Immature Granulocytes: 2 %
Lymphocytes Relative: 7 %
Lymphs Abs: 1.2 10*3/uL (ref 0.7–4.0)
MCH: 29.8 pg (ref 26.0–34.0)
MCHC: 31.5 g/dL (ref 30.0–36.0)
MCV: 94.7 fL (ref 80.0–100.0)
Monocytes Absolute: 0.8 10*3/uL (ref 0.1–1.0)
Monocytes Relative: 5 %
Neutro Abs: 13.4 10*3/uL — ABNORMAL HIGH (ref 1.7–7.7)
Neutrophils Relative %: 86 %
Platelets: 392 10*3/uL (ref 150–400)
RBC: 3.02 MIL/uL — ABNORMAL LOW (ref 3.87–5.11)
RDW: 14.2 % (ref 11.5–15.5)
WBC: 15.7 10*3/uL — ABNORMAL HIGH (ref 4.0–10.5)
nRBC: 0 % (ref 0.0–0.2)

## 2019-06-03 LAB — URINALYSIS, ROUTINE W REFLEX MICROSCOPIC
Bilirubin Urine: NEGATIVE
Glucose, UA: NEGATIVE mg/dL
Hgb urine dipstick: NEGATIVE
Ketones, ur: NEGATIVE mg/dL
Nitrite: NEGATIVE
Protein, ur: NEGATIVE mg/dL
Specific Gravity, Urine: 1.01 (ref 1.005–1.030)
WBC, UA: >50 WBC/hpf — ABNORMAL HIGH (ref 0–5)
pH: 8 (ref 5.0–8.0)

## 2019-06-03 LAB — SARS CORONAVIRUS 2 BY RT PCR (HOSPITAL ORDER, PERFORMED IN ~~LOC~~ HOSPITAL LAB): SARS Coronavirus 2: NEGATIVE

## 2019-06-03 LAB — COMPREHENSIVE METABOLIC PANEL
ALT: 14 U/L (ref 0–44)
AST: 12 U/L — ABNORMAL LOW (ref 15–41)
Albumin: 2.6 g/dL — ABNORMAL LOW (ref 3.5–5.0)
Alkaline Phosphatase: 103 U/L (ref 38–126)
Anion gap: 10 (ref 5–15)
BUN: 23 mg/dL (ref 8–23)
CO2: 33 mmol/L — ABNORMAL HIGH (ref 22–32)
Calcium: 8.5 mg/dL — ABNORMAL LOW (ref 8.9–10.3)
Chloride: 93 mmol/L — ABNORMAL LOW (ref 98–111)
Creatinine, Ser: 1.07 mg/dL — ABNORMAL HIGH (ref 0.44–1.00)
GFR calc Af Amer: 51 mL/min — ABNORMAL LOW (ref >60)
GFR calc non Af Amer: 44 mL/min — ABNORMAL LOW (ref >60)
Glucose, Bld: 159 mg/dL — ABNORMAL HIGH (ref 70–99)
Potassium: 3.9 mmol/L (ref 3.5–5.1)
Sodium: 136 mmol/L (ref 135–145)
Total Bilirubin: 0.2 mg/dL — ABNORMAL LOW (ref 0.3–1.2)
Total Protein: 6.3 g/dL — ABNORMAL LOW (ref 6.5–8.1)

## 2019-06-03 LAB — APTT: aPTT: 34 seconds (ref 24–36)

## 2019-06-03 LAB — LACTIC ACID, PLASMA: Lactic Acid, Venous: 1.2 mmol/L (ref 0.5–1.9)

## 2019-06-03 LAB — HEMOGLOBIN A1C
Hgb A1c MFr Bld: 6.6 % — ABNORMAL HIGH (ref 4.8–5.6)
Mean Plasma Glucose: 142.72 mg/dL

## 2019-06-03 MED ORDER — AMLODIPINE BESYLATE 10 MG PO TABS
10.0000 mg | ORAL_TABLET | Freq: Every day | ORAL | Status: DC
  Administered 2019-06-04 – 2019-06-05 (×2): 10 mg via ORAL
  Filled 2019-06-03 (×2): qty 1

## 2019-06-03 MED ORDER — RANOLAZINE ER 500 MG PO TB12
500.0000 mg | ORAL_TABLET | Freq: Two times a day (BID) | ORAL | Status: DC
  Administered 2019-06-04 – 2019-06-05 (×4): 500 mg via ORAL
  Filled 2019-06-03 (×7): qty 1

## 2019-06-03 MED ORDER — ACETAMINOPHEN 650 MG RE SUPP: 650.0000 mg | Freq: Four times a day (QID) | RECTAL | Status: DC | PRN

## 2019-06-03 MED ORDER — INSULIN ASPART 100 UNIT/ML ~~LOC~~ SOLN
0.0000 [IU] | Freq: Three times a day (TID) | SUBCUTANEOUS | Status: DC
  Administered 2019-06-04: 1 [IU] via SUBCUTANEOUS
  Administered 2019-06-04: 5 [IU] via SUBCUTANEOUS
  Administered 2019-06-04: 09:00:00 1 [IU] via SUBCUTANEOUS
  Administered 2019-06-05: 3 [IU] via SUBCUTANEOUS
  Administered 2019-06-05: 2 [IU] via SUBCUTANEOUS
  Administered 2019-06-05: 3 [IU] via SUBCUTANEOUS
  Filled 2019-06-03: qty 0.09

## 2019-06-03 MED ORDER — FENTANYL CITRATE (PF) 100 MCG/2ML IJ SOLN
50.0000 ug | Freq: Once | INTRAMUSCULAR | Status: AC
  Administered 2019-06-03: 50 ug via INTRAVENOUS
  Filled 2019-06-03: qty 2

## 2019-06-03 MED ORDER — ASPIRIN 81 MG PO CHEW
81.0000 mg | CHEWABLE_TABLET | Freq: Every day | ORAL | Status: DC
  Administered 2019-06-04 – 2019-06-05 (×2): 81 mg via ORAL
  Filled 2019-06-03 (×2): qty 1

## 2019-06-03 MED ORDER — VANCOMYCIN HCL 10 G IV SOLR
1500.0000 mg | INTRAVENOUS | Status: DC
  Filled 2019-06-03: qty 1500

## 2019-06-03 MED ORDER — FUROSEMIDE 40 MG PO TABS
40.0000 mg | ORAL_TABLET | Freq: Every day | ORAL | Status: DC
  Administered 2019-06-04 (×2): 40 mg via ORAL
  Filled 2019-06-03 (×2): qty 1

## 2019-06-03 MED ORDER — HEPARIN SODIUM (PORCINE) 5000 UNIT/ML IJ SOLN
5000.0000 [IU] | Freq: Three times a day (TID) | INTRAMUSCULAR | Status: DC
  Administered 2019-06-04 – 2019-06-05 (×5): 5000 [IU] via SUBCUTANEOUS
  Filled 2019-06-03 (×6): qty 1

## 2019-06-03 MED ORDER — VANCOMYCIN HCL IN DEXTROSE 1-5 GM/200ML-% IV SOLN
1000.0000 mg | Freq: Once | INTRAVENOUS | Status: AC
  Administered 2019-06-03: 1000 mg via INTRAVENOUS
  Filled 2019-06-03: qty 200

## 2019-06-03 MED ORDER — VANCOMYCIN HCL 500 MG IV SOLR
500.0000 mg | INTRAVENOUS | Status: AC
  Administered 2019-06-03: 500 mg via INTRAVENOUS
  Filled 2019-06-03: qty 500

## 2019-06-03 MED ORDER — SODIUM CHLORIDE 0.9 % IV SOLN
1.0000 g | INTRAVENOUS | Status: DC
  Administered 2019-06-04 – 2019-06-05 (×2): 1 g via INTRAVENOUS
  Filled 2019-06-03 (×2): qty 10

## 2019-06-03 MED ORDER — ACETAMINOPHEN 325 MG PO TABS
650.0000 mg | ORAL_TABLET | Freq: Four times a day (QID) | ORAL | Status: DC | PRN
  Administered 2019-06-04 – 2019-06-05 (×3): 650 mg via ORAL
  Filled 2019-06-03 (×3): qty 2

## 2019-06-03 MED ORDER — ONDANSETRON HCL 4 MG/2ML IJ SOLN: 4.0000 mg | Freq: Four times a day (QID) | INTRAMUSCULAR | Status: DC | PRN

## 2019-06-03 MED ORDER — ONDANSETRON HCL 4 MG PO TABS: 4.0000 mg | ORAL_TABLET | Freq: Four times a day (QID) | ORAL | Status: DC | PRN

## 2019-06-03 MED ORDER — ENSURE ENLIVE PO LIQD
237.0000 mL | Freq: Two times a day (BID) | ORAL | Status: DC
  Administered 2019-06-04 – 2019-06-05 (×4): 237 mL via ORAL
  Filled 2019-06-03 (×2): qty 237

## 2019-06-03 MED ORDER — METOPROLOL SUCCINATE ER 50 MG PO TB24
100.0000 mg | ORAL_TABLET | Freq: Every day | ORAL | Status: DC
  Administered 2019-06-04 – 2019-06-05 (×2): 100 mg via ORAL
  Filled 2019-06-03 (×2): qty 2

## 2019-06-03 MED ORDER — SODIUM CHLORIDE 0.9 % IV SOLN
2.0000 g | Freq: Once | INTRAVENOUS | Status: AC
  Administered 2019-06-03: 2 g via INTRAVENOUS
  Filled 2019-06-03: qty 20

## 2019-06-03 MED ORDER — LISINOPRIL 20 MG PO TABS
20.0000 mg | ORAL_TABLET | Freq: Two times a day (BID) | ORAL | Status: DC
  Administered 2019-06-04 – 2019-06-05 (×4): 20 mg via ORAL
  Filled 2019-06-03 (×5): qty 1

## 2019-06-03 MED ORDER — ISOSORBIDE MONONITRATE ER 60 MG PO TB24
60.0000 mg | ORAL_TABLET | Freq: Every day | ORAL | Status: DC
  Administered 2019-06-04 – 2019-06-05 (×2): 60 mg via ORAL
  Filled 2019-06-03 (×2): qty 1

## 2019-06-03 MED ORDER — CLOPIDOGREL BISULFATE 75 MG PO TABS
75.0000 mg | ORAL_TABLET | Freq: Every day | ORAL | Status: DC
  Administered 2019-06-04 – 2019-06-05 (×2): 75 mg via ORAL
  Filled 2019-06-03 (×2): qty 1

## 2019-06-03 NOTE — ED Triage Notes (Signed)
Patient arrived by EMS from [facility]. PT was seen for infection on feet x 2 weeks ago. Pt was prescribed antibiotics.   PT has green pus coming out of both feet per EMS.   Hx of DM.

## 2019-06-03 NOTE — ED Provider Notes (Signed)
[facility] DEPT Provider Note   CSN: [id number] Arrival date & time: 06/03/19  1508    History   Chief Complaint Chief Complaint  Patient presents with  . Foot Pain    HPI Charlene Greene is a 85 y.o. female.     HPI  85 year old female sent from her nursing home due to bilateral foot pain and concern for infection.  The patient has been having bilateral foot pain, left worse than right, for about 1 week.  There has been drainage.  No fevers.  She was here a few days ago for generalized weakness and thinks she might have been started on an antibiotic.  The pain is at times severe.  Past Medical History:  Diagnosis Date  . CAD (coronary artery disease)   . Diabetes mellitus without complication ([place])   . Hyperlipidemia   . Hypertension   . Pacemaker   . Renal artery stenosis (HCC)    s/p bilateral stents  . Sick sinus syndrome ([place])   . Stroke [facility])    2 previous cva's    Patient Active Problem List   Diagnosis Date Noted  . Cellulitis of left foot 06/03/2019  . History of CVA (cerebrovascular accident) 06/03/2019  . CAD (coronary artery disease) 06/03/2019  . Acute on chronic diastolic CHF (congestive heart failure) ([place]) 10/19/2018  . Non-ST elevation (NSTEMI) myocardial infarction ([place]) 08/05/2018  . Oxygen dependent 08/02/2018  . Elevated troponin 08/02/2018  . Pressure injury of skin 05/12/2018  . HCAP (healthcare-associated pneumonia) 05/08/2018  . Acute respiratory failure with hypoxia ([place]) 05/07/2018  . Sleep disturbance   . Elevated BUN   . Labile blood pressure   . Essential hypertension   . Dysphasia, post-stroke   . PAD (peripheral artery disease) ([place])   . Hypoalbuminemia due to protein-calorie malnutrition ([place])   . Diabetic peripheral neuropathy ([place])   . Diabetes mellitus type 2 in nonobese (HCC)   . Chronic diastolic congestive heart failure ([place])   . Left thalamic infarction ([place]) 02/26/2018  . Dysphagia,  post-stroke   . Acute on chronic diastolic congestive heart failure ([place])   . Type 2 diabetes mellitus with peripheral neuropathy (HCC)   . TIA (transient ischemic attack) 02/22/2018  . Labile blood glucose   . Hypotension due to drugs   . Hyponatremia   . Renovascular hypertension   . Hypertensive crisis   . Type 2 diabetes mellitus with diabetic autonomic neuropathy, without long-term current use of insulin ([place])   . Stage 3 chronic kidney disease ([place])   . Acute lower UTI   . Acute renal insufficiency 09/27/2016  . Urinary urgency 09/27/2016  . Basal ganglia infarction ([place]) 09/26/2016  . Neuropathic pain   . Benign essential HTN   . Slow transit constipation   . Elevated creatine kinase   . Accelerated hypertension 09/24/2016  . Diabetes mellitus ([place]) 09/24/2016  . Hyperlipidemia 09/24/2016  . Sick sinus syndrome ([place]) 09/24/2016  . CVA (cerebral vascular accident) ([place]) 09/23/2016  . Stroke (cerebrum) ([place]) 09/23/2016    Past Surgical History:  Procedure Laterality Date  . PACEMAKER PLACEMENT  05/15/2014   MDT Advisa MRI implanted at [place] for sick sinus syndrome     OB History    Gravida      Para      Term      Preterm      AB      Living  2     SAB  TAB      Ectopic      Multiple      Live Births               Home Medications    Prior to Admission medications   Medication Sig Start Date End Date Taking? Authorizing Provider  acetaminophen (TYLENOL) 500 MG tablet Take 500 mg by mouth 2 (two) times a day.    Yes [provider]  amLODipine (NORVASC) 10 MG tablet Take 1 tablet (10 mg total) by mouth 2 (two) times daily. 10mg  in the am and 5mg  in the pm Patient taking differently: Take 10 mg by mouth daily.  08/18/18  Yes Jodelle GrossLawrence, Kathryn M, NP  amLODipine (NORVASC) 5 MG tablet Take 5 mg by mouth at bedtime. 03/30/19  Yes [provider]  aspirin 81 MG chewable tablet Chew 1 tablet (81 mg total) by mouth daily. With  Food 08/08/18  Yes Emokpae, Courage, MD  cholecalciferol (VITAMIN D3) 25 MCG (1000 UT) tablet Take 1,000 Units by mouth daily.   Yes [provider]  clindamycin (CLEOCIN) 300 MG capsule Take 300 mg by mouth 3 (three) times daily.  06/01/19  Yes [provider]  clopidogrel (PLAVIX) 75 MG tablet Take 1 tablet (75 mg total) by mouth daily. 03/13/18  Yes Angiulli, Mcarthur Rossettianiel J, PA-C  feeding supplement, ENSURE ENLIVE, (ENSURE ENLIVE) LIQD Take 237 mLs by mouth 2 (two) times daily between meals. 05/14/18  Yes Roberto ScalesNettey, Shayla D, MD  furosemide (LASIX) 40 MG tablet Take 80mg  in Am and 40mg  in PM. Patient taking differently: Take 40 mg by mouth at bedtime.  10/22/18  Yes Osvaldo ShipperKrishnan, Gokul, MD  furosemide (LASIX) 80 MG tablet Take 80 mg by mouth daily.   Yes [provider]  glimepiride (AMARYL) 1 MG tablet Take 1 tablet (1 mg total) by mouth daily with breakfast. 03/13/18  Yes Angiulli, Mcarthur Rossettianiel J, PA-C  isosorbide mononitrate (IMDUR) 60 MG 24 hr tablet Take 1 tablet (60 mg total) by mouth daily. 10/11/16  Yes Angiulli, Mcarthur Rossettianiel J, PA-C  lisinopril (PRINIVIL,ZESTRIL) 20 MG tablet Take 1 tablet (20 mg total) by mouth 2 (two) times daily. 03/13/18  Yes Angiulli, Mcarthur Rossettianiel J, PA-C  Magnesium 250 MG TABS Take 250 mg by mouth at bedtime.   Yes [provider]  metoprolol succinate (TOPROL-XL) 100 MG 24 hr tablet Take 1 tablet (100 mg total) by mouth daily. 03/13/18  Yes Angiulli, Mcarthur Rossettianiel J, PA-C  Multiple Vitamin (MULTIVITAMIN WITH MINERALS) TABS tablet Take 1 tablet by mouth daily. 05/14/18  Yes Roberto ScalesNettey, Shayla D, MD  ranolazine (RANEXA) 500 MG 12 hr tablet Take 1 tablet (500 mg total) by mouth 2 (two) times daily. 10/11/16  Yes Angiulli, Mcarthur Rossettianiel J, PA-C  dextromethorphan-guaiFENesin (MUCINEX DM) 30-600 MG 12hr tablet Take 1 tablet by mouth 2 (two) times daily. Patient not taking: Reported on 06/03/2019 05/13/18   Roberto ScalesNettey, Shayla D, MD  nitroGLYCERIN (NITROSTAT) 0.4 MG SL tablet Place 0.4 mg under the tongue  every 5 (five) minutes x 3 doses as needed for chest pain (and call 9-1-1 and MD, if no relief).     [provider]  ondansetron (ZOFRAN) 4 MG tablet Take 4 mg by mouth every 6 (six) hours as needed for nausea or vomiting.  05/08/19   [provider]  senna (SENOKOT) 8.6 MG TABS tablet Take 2 tablets (17.2 mg total) by mouth at bedtime. Patient taking differently: Take 2 tablets by mouth every Monday, Wednesday, and Friday at 8  PM.  08/07/18   Shon Hale, MD    Family History Family History  Problem Relation Age of Onset  . Lung cancer Sister   . Heart failure Sister   . Heart disease Sister   . Vision loss Sister   . Mental illness Sister   . Hypertension Son   . Vision loss Son   . Vision loss Mother   . Vision loss Father   . Depression Father   . Diabetes Paternal Aunt   . Diabetes Maternal Aunt   . CAD Neg Hx   . Stroke Neg Hx     Social History Social History   Tobacco Use  . Smoking status: Never Smoker  . Smokeless tobacco: Never Used  Substance Use Topics  . Alcohol use: No  . Drug use: Never     Allergies   Statins, Other, Amlodipine, Celecoxib, Codeine, Naproxen, and Piroxicam   Review of Systems Review of Systems  Constitutional: Negative for fever.  Musculoskeletal: Positive for arthralgias and joint swelling.  Skin: Positive for color change and wound.  All other systems reviewed and are negative.    Physical Exam Updated Vital Signs BP (!) 135/53 (BP Location: Left Arm)   Pulse 70   Temp 99.1 F (37.3 C) (Rectal)   Resp 14   Ht  (1.676 m)   Wt 74.6 kg   SpO2 99%   BMI 26.55 kg/m   Physical Exam Vitals signs and nursing note reviewed.  Constitutional:      Appearance: She is well-developed.  HENT:     Head: Normocephalic and atraumatic.     Right Ear: External ear normal.     Left Ear: External ear normal.     Nose: Nose normal.  Eyes:     General:        Right eye: No discharge.        Left eye: No  discharge.  Cardiovascular:     Rate and Rhythm: Normal rate and regular rhythm.     Heart sounds: Normal heart sounds.  Pulmonary:     Effort: Pulmonary effort is normal.     Breath sounds: Normal breath sounds.  Abdominal:     General: There is no distension.     Palpations: Abdomen is soft.     Tenderness: There is no abdominal tenderness.  Musculoskeletal:     Right foot: Tenderness and swelling present.     Left foot: Tenderness and swelling present.     Comments: See pictures. More prominent cellulitis in left foot. There also appears to be gangrene in left toes. Bilateral pedal edema.  Skin:    General: Skin is warm and dry.     Findings: Erythema present.  Neurological:     Mental Status: She is alert.  Psychiatric:        Mood and Affect: Mood is not anxious.        ED Treatments / Results  Labs (all labs ordered are listed, but only abnormal results are displayed) Labs Reviewed  COMPREHENSIVE METABOLIC PANEL - Abnormal; Notable for the following components:      Result Value   Chloride 93 (*)    CO2 33 (*)    Glucose, Bld 159 (*)    Creatinine, Ser 1.07 (*)    Calcium 8.5 (*)    Total Protein 6.3 (*)    Albumin 2.6 (*)    AST 12 (*)    Total Bilirubin 0.2 (*)    GFR calc  non Af Amer 44 (*)    GFR calc Af Amer 51 (*)    All other components within normal limits  CBC WITH DIFFERENTIAL/PLATELET - Abnormal; Notable for the following components:   WBC 15.7 (*)    RBC 3.02 (*)    Hemoglobin 9.0 (*)    HCT 28.6 (*)    Neutro Abs 13.4 (*)    Abs Immature Granulocytes 0.24 (*)    All other components within normal limits  CULTURE, BLOOD (ROUTINE X 2)  CULTURE, BLOOD (ROUTINE X 2)  URINE CULTURE  SARS CORONAVIRUS 2 (HOSPITAL ORDER, PERFORMED IN [facility])  LACTIC ACID, PLASMA  APTT  PROTIME-INR  URINALYSIS, ROUTINE W REFLEX MICROSCOPIC    EKG EKG Interpretation  Date/Time:  Wednesday June 03 2019 15:32:24 EDT Ventricular Rate:  70 PR  Interval:    QRS Duration: 101 QT Interval:  441 QTC Calculation: 476 R Axis:   -2 Text Interpretation:  Sinus rhythm Prolonged PR interval Low voltage, precordial leads Abnormal R-wave progression, early transition Baseline wander in lead(s) III Confirmed by Pricilla LovelessGoldston, Maurya Nethery [phone]) on 06/03/2019 3:58:51 PM   Radiology Dg Foot Complete Left  Result Date: 06/03/2019 CLINICAL DATA:  Left foot infection. EXAM: LEFT FOOT - COMPLETE 3+ VIEW COMPARISON:  None. FINDINGS: No osseous destruction or periosteal reaction. No acute fracture or dislocation. Mild first MTP joint osteoarthritis. Remaining joint spaces are relatively preserved. Osteopenia. Diffuse soft tissue swelling. IMPRESSION: 1. Diffuse soft tissue swelling.  No acute osseous abnormality. Electronically Signed   By: Obie DredgeWilliam T Derry M.D.   On: 06/03/2019 16:39   Dg Foot Complete Right  Result Date: 06/03/2019 CLINICAL DATA:  Foot infection. EXAM: RIGHT FOOT COMPLETE - 3+ VIEW COMPARISON:  03/07/2018 FINDINGS: There is no fracture or dislocation or bone destruction. No discrete periosteal reaction. There is dorsal soft tissue swelling of the forefoot. Minimal arthritic changes in the midfoot. IMPRESSION: No radiographic evidence of osteomyelitis.  Soft tissue swelling. Electronically Signed   By: Francene BoyersJames  Maxwell M.D.   On: 06/03/2019 16:40    Procedures Procedures (including critical care time)  Medications Ordered in ED Medications  vancomycin (VANCOCIN) IVPB 1000 mg/200 mL premix (0 mg Intravenous Stopped 06/03/19 1821)  cefTRIAXone (ROCEPHIN) 2 g in sodium chloride 0.9 % 100 mL IVPB (0 g Intravenous Stopped 06/03/19 1711)  fentaNYL (SUBLIMAZE) injection 50 mcg (50 mcg Intravenous Given 06/03/19 1634)     Initial Impression / Assessment and Plan / ED Course  I have reviewed the triage vital signs and the nursing notes.  Pertinent labs & imaging results that were available during my care of the patient were reviewed by me and  considered in my medical decision making (see chart for details).        Patient's exam is concerning for gangrene of her left foot.  She was given broad IV antibiotics.  Does not meet criteria for sepsis though her WBC is a little more elevated than a few days ago.  Lactic is okay.  X-rays without overt osteomyelitis.  I discussed with the hospitalist, patient will be admitted for IV antibiotics and further work-up and care.  Discussed with the son at bedside.  Susa Griffinsell Puga was evaluated in Emergency Department on 06/03/2019 for the symptoms described in the history of present illness. She was evaluated in the context of the global COVID-19 pandemic, which necessitated consideration that the patient might be at risk for infection with the SARS-CoV-2 virus that causes COVID-19. Institutional protocols and algorithms that  pertain to the evaluation of patients at risk for COVID-19 are in a state of rapid change based on information released by regulatory bodies including the CDC and federal and state organizations. These policies and algorithms were followed during the patient's care in the ED.   Final Clinical Impressions(s) / ED Diagnoses   Final diagnoses:  Cellulitis of left foot  Gangrene of left foot [facility](HCC)    ED Discharge Orders    None       Pricilla LovelessGoldston, Randale Carvalho, MD 06/03/19 1925

## 2019-06-03 NOTE — H&P (Signed)
History and Physical    Charlene Greene QMV:[id number] DOB: 01-15-1922 DOA: 06/03/2019  PCP: [facility]  Patient coming from: [facility]  I have personally briefly reviewed patient's old medical records in [place]  Chief Complaint: Foot infection  HPI:  Charlene Greene is a 85 y.o. female with medical history significant for CAD, chronic dCHF, SSS s/p PPM, hx of CVA w/ residual left sided weakness, T2DM, HTN, HLD, CKD Stage 3 who presents to the ED for evaluation of infection of her left foot.  History is supplemented by son who is at bedside.  Patient is a resident at [facility] for the last 2 years.  Per son, she has been treated by her physician with Bactrim for a reported left foot infection.  She has also been following with [facility].  Patient reports having pain in both her feet ongoing for several weeks.  She has noted seen wounds with discharge at times.  Son states that he has not seen the patient since March due to the ongoing COVID-19 pandemic however when he saw her in the last few days he noticed patient had black toes on her left foot which is new compared to the last time he saw her.  Patient currently denies any chest pain, palpitations, dyspnea, fevers, chills, diaphoresis, abdominal pain, or dysuria.  ED Course:  Initial vitals showed BP 136/57, pulse 69, RR 12, temp 99.1 Fahrenheit rectally, SPO2 94% on room air.  Labs are notable for potassium 3.9, bicarb 33, BUN 23, creatinine 1.07, WBC 15.7, hemoglobin 9.0, platelets 398,000.  SARS-CoV-2 test was obtained and negative.  Blood cultures were drawn and pending.  X-ray of the left foot showed diffuse soft tissue swelling without acute osseous abnormality.  X-ray of the right foot showed soft tissue swelling without evidence of osteomyelitis.  Patient was given IV vancomycin and ceftriaxone and the hospitalist service was consulted admit for further evaluation and management.     Review of Systems: All systems reviewed and are negative except as documented in history of present illness above.   Past Medical History:  Diagnosis Date  . CAD (coronary artery disease)   . Diabetes mellitus without complication ([place])   . Hyperlipidemia   . Hypertension   . Pacemaker   . Renal artery stenosis (HCC)    s/p bilateral stents  . Sick sinus syndrome ([place])   . Stroke [facility])    2 previous cva's    Past Surgical History:  Procedure Laterality Date  . PACEMAKER PLACEMENT  05/15/2014   MDT Advisa MRI implanted at [facility] for sick sinus syndrome    Social History:  reports that she has never smoked. She has never used smokeless tobacco. She reports that she does not drink alcohol or use drugs.  Allergies  Allergen Reactions  . Statins Other (See Comments)    Muscle weakness/pain  . Other Other (See Comments)    Reductase Inhibitors- "Allergic," per MAR/reaction not recalled by the patient "Dye"- "Allergic," per MAR/reaction not recalled by the patient  . Amlodipine Other (See Comments)    Unknown   . Celecoxib Nausea Only and Other (See Comments)  . Codeine Other (See Comments)    "Allergic," per MAR/reaction not recalled by the patient  . Naproxen Other (See Comments)    Unknown   . Piroxicam Other (See Comments)    "Allergic," per MAR/reaction not recalled by the patient    Family History  Problem Relation Age of Onset  . Lung cancer  Sister   . Heart failure Sister   . Heart disease Sister   . Vision loss Sister   . Mental illness Sister   . Hypertension Son   . Vision loss Son   . Vision loss Mother   . Vision loss Father   . Depression Father   . Diabetes Paternal Aunt   . Diabetes Maternal Aunt   . CAD Neg Hx   . Stroke Neg Hx      Prior to Admission medications   Medication Sig Start Date End Date Taking? Authorizing Provider  acetaminophen (TYLENOL) 500 MG tablet Take 500 mg by mouth 2 (two) times a day.    Yes [provider]   amLODipine (NORVASC) 10 MG tablet Take 1 tablet (10 mg total) by mouth 2 (two) times daily. 10mg  in the am and 5mg  in the pm Patient taking differently: Take 10 mg by mouth daily.  08/18/18  Yes Jodelle GrossLawrence, Kathryn M, NP  amLODipine (NORVASC) 5 MG tablet Take 5 mg by mouth at bedtime. 03/30/19  Yes [provider]  aspirin 81 MG chewable tablet Chew 1 tablet (81 mg total) by mouth daily. With Food 08/08/18  Yes Emokpae, Courage, MD  cholecalciferol (VITAMIN D3) 25 MCG (1000 UT) tablet Take 1,000 Units by mouth daily.   Yes [provider]  clindamycin (CLEOCIN) 300 MG capsule Take 300 mg by mouth 3 (three) times daily.  06/01/19  Yes [provider]  clopidogrel (PLAVIX) 75 MG tablet Take 1 tablet (75 mg total) by mouth daily. 03/13/18  Yes Angiulli, Mcarthur Rossettianiel J, PA-C  feeding supplement, ENSURE ENLIVE, (ENSURE ENLIVE) LIQD Take 237 mLs by mouth 2 (two) times daily between meals. 05/14/18  Yes Roberto ScalesNettey, Shayla D, MD  furosemide (LASIX) 40 MG tablet Take 80mg  in Am and 40mg  in PM. Patient taking differently: Take 40 mg by mouth at bedtime.  10/22/18  Yes Osvaldo ShipperKrishnan, Gokul, MD  furosemide (LASIX) 80 MG tablet Take 80 mg by mouth daily.   Yes [provider]  glimepiride (AMARYL) 1 MG tablet Take 1 tablet (1 mg total) by mouth daily with breakfast. 03/13/18  Yes Angiulli, Mcarthur Rossettianiel J, PA-C  isosorbide mononitrate (IMDUR) 60 MG 24 hr tablet Take 1 tablet (60 mg total) by mouth daily. 10/11/16  Yes Angiulli, Mcarthur Rossettianiel J, PA-C  lisinopril (PRINIVIL,ZESTRIL) 20 MG tablet Take 1 tablet (20 mg total) by mouth 2 (two) times daily. 03/13/18  Yes Angiulli, Mcarthur Rossettianiel J, PA-C  Magnesium 250 MG TABS Take 250 mg by mouth at bedtime.   Yes [provider]  metoprolol succinate (TOPROL-XL) 100 MG 24 hr tablet Take 1 tablet (100 mg total) by mouth daily. 03/13/18  Yes Angiulli, Mcarthur Rossettianiel J, PA-C  Multiple Vitamin (MULTIVITAMIN WITH MINERALS) TABS tablet Take 1 tablet by mouth daily. 05/14/18  Yes Roberto ScalesNettey,  Shayla D, MD  ranolazine (RANEXA) 500 MG 12 hr tablet Take 1 tablet (500 mg total) by mouth 2 (two) times daily. 10/11/16  Yes Angiulli, Mcarthur Rossettianiel J, PA-C  dextromethorphan-guaiFENesin (MUCINEX DM) 30-600 MG 12hr tablet Take 1 tablet by mouth 2 (two) times daily. Patient not taking: Reported on 06/03/2019 05/13/18   Roberto ScalesNettey, Shayla D, MD  nitroGLYCERIN (NITROSTAT) 0.4 MG SL tablet Place 0.4 mg under the tongue every 5 (five) minutes x 3 doses as needed for chest pain (and call 9-1-1 and MD, if no relief).     [provider]  ondansetron (ZOFRAN) 4 MG tablet Take 4 mg by mouth every 6 (six) hours as needed  for nausea or vomiting.  05/08/19   [provider]  senna (SENOKOT) 8.6 MG TABS tablet Take 2 tablets (17.2 mg total) by mouth at bedtime. Patient taking differently: Take 2 tablets by mouth every Monday, Wednesday, and Friday at 8 PM.  08/07/18   Shon HaleEmokpae, Courage, MD    Physical Exam: Vitals:   06/03/19 1830 06/03/19 1858 06/03/19 1900 06/03/19 2000  BP: (!) 135/53  115/66 138/81  Pulse: 70     Resp: 14  13 13   Temp:      TempSrc:      SpO2: 99%     Weight:  74.6 kg    Height:  5\' 6"  (1.676 m)      Constitutional: Elderly woman resting supine in bed, NAD, calm, comfortable Eyes: PERRL, lids and conjunctivae normal ENMT: Mucous membranes are moist. Posterior pharynx clear of any exudate or lesions.Normal dentition.  Neck: normal, supple, no masses. Respiratory: clear to auscultation bilaterally, no wheezing, no crackles. Normal respiratory effort. No accessory muscle use.  Cardiovascular: Regular rate and rhythm, no murmurs / rubs / gallops. No extremity edema.  PPM in place left chest wall.  Pedal pulses nonpalpable bilateral feet. Abdomen: no tenderness, no masses palpated. No hepatosplenomegaly. Bowel sounds positive.  Musculoskeletal: Dry gangrene left third-fifth toes, circumferential ulceration left medial foot at base of first toe Skin: Dry gangrene left third-fifth  toes, circumferential ulceration left medial foot at base of first toe.  Stage I pressure ulcer of the sacrum. Neurologic: CN 2-12 grossly intact. Sensation intact, Strength 5/5 in all 4.  Psychiatric: Normal judgment and insight. Alert and oriented x 3. Normal mood.       Labs on Admission: I have personally reviewed following labs and imaging studies  CBC: Recent Labs  Lab 05/31/19 1414 06/03/19 1548  WBC 13.5* 15.7*  NEUTROABS 11.0* 13.4*  HGB 10.1* 9.0*  HCT 31.5* 28.6*  MCV 94.0 94.7  PLT 343 392   Basic Metabolic Panel: Recent Labs  Lab 05/31/19 1414 06/03/19 1548  NA 137 136  K 4.6 3.9  CL 92* 93*  CO2 32 33*  GLUCOSE 144* 159*  BUN 27* 23  CREATININE 1.13* 1.07*  CALCIUM 8.8* 8.5*   GFR: Estimated Creatinine Clearance: 33.2 mL/min (A) (by C-G formula based on SCr of 1.07 mg/dL (H)). Liver Function Tests: Recent Labs  Lab 06/03/19 1548  AST 12*  ALT 14  ALKPHOS 103  BILITOT 0.2*  PROT 6.3*  ALBUMIN 2.6*   No results for input(s): LIPASE, AMYLASE in the last 168 hours. No results for input(s): AMMONIA in the last 168 hours. Coagulation Profile: Recent Labs  Lab 06/03/19 1548  INR 1.1   Cardiac Enzymes: No results for input(s): CKTOTAL, CKMB, CKMBINDEX, TROPONINI in the last 168 hours. BNP (last 3 results) No results for input(s): PROBNP in the last 8760 hours. HbA1C: No results for input(s): HGBA1C in the last 72 hours. CBG: No results for input(s): GLUCAP in the last 168 hours. Lipid Profile: No results for input(s): CHOL, HDL, LDLCALC, TRIG, CHOLHDL, LDLDIRECT in the last 72 hours. Thyroid Function Tests: No results for input(s): TSH, T4TOTAL, FREET4, T3FREE, THYROIDAB in the last 72 hours. Anemia Panel: No results for input(s): VITAMINB12, FOLATE, FERRITIN, TIBC, IRON, RETICCTPCT in the last 72 hours. Urine analysis:    Component Value Date/Time   COLORURINE YELLOW 05/31/2019 1414   APPEARANCEUR CLEAR 05/31/2019 1414   LABSPEC 1.009  05/31/2019 1414   PHURINE 6.0 05/31/2019 1414   GLUCOSEU NEGATIVE 05/31/2019  1414   HGBUR NEGATIVE 05/31/2019 1414   BILIRUBINUR NEGATIVE 05/31/2019 1414   KETONESUR NEGATIVE 05/31/2019 1414   PROTEINUR NEGATIVE 05/31/2019 1414   NITRITE NEGATIVE 05/31/2019 1414   LEUKOCYTESUR NEGATIVE 05/31/2019 1414    Radiological Exams on Admission: Dg Foot Complete Left  Result Date: 06/03/2019 CLINICAL DATA:  Left foot infection. EXAM: LEFT FOOT - COMPLETE 3+ VIEW COMPARISON:  None. FINDINGS: No osseous destruction or periosteal reaction. No acute fracture or dislocation. Mild first MTP joint osteoarthritis. Remaining joint spaces are relatively preserved. Osteopenia. Diffuse soft tissue swelling. IMPRESSION: 1. Diffuse soft tissue swelling.  No acute osseous abnormality. Electronically Signed   By: Obie DredgeWilliam T Derry M.D.   On: 06/03/2019 16:39   Dg Foot Complete Right  Result Date: 06/03/2019 CLINICAL DATA:  Foot infection. EXAM: RIGHT FOOT COMPLETE - 3+ VIEW COMPARISON:  03/07/2018 FINDINGS: There is no fracture or dislocation or bone destruction. No discrete periosteal reaction. There is dorsal soft tissue swelling of the forefoot. Minimal arthritic changes in the midfoot. IMPRESSION: No radiographic evidence of osteomyelitis.  Soft tissue swelling. Electronically Signed   By: Francene BoyersJames  Maxwell M.D.   On: 06/03/2019 16:40    EKG: Independently reviewed. NSR, 1st degree AV block, early R-wave transition. Prior EKG showed paced rhythm.  Assessment/Plan Principal Problem:   Cellulitis of left foot Active Problems:   Diabetes mellitus (HCC)   Hyperlipidemia   Stage 3 chronic kidney disease (HCC)   Chronic diastolic congestive heart failure (HCC)   Essential hypertension   History of CVA (cerebrovascular accident)   CAD (coronary artery disease)   Dry gangrene (HCC)   Lacora Ephriam KnucklesClifton is a 85 y.o. female with medical history significant for CAD, chronic dCHF, SSS s/p PPM, hx of CVA w/ residual left  sided weakness, T2DM, HTN, HLD, CKD Stage 3 who is admitted with dry gangrene of the left foot.  Dry gangrene of left foot: Suspect due to underlying peripheral vascular disease.  Per son, has been treated with Bactrim as an outpatient. -Continue IV vancomycin and ceftriaxone -Obtain MRI left foot -discussed with MRI tech, [facility] -Consider vascular surgery consult in a.m.  Chronic diastolic CHF: Appears euvolemic on admission. -Continue home Lasix  Hypertension: Currently stable. -Continue home amlodipine, lisinopril, Toprol-XL, Imdur, and Lasix  History of CAD: Currently stable.  Denies any active chest pain.  Not on statin therapy due to previously reported muscle weakness. -Continue home aspirin, Plavix, Toprol-XL, Imdur, ranolazine  History of CVA: Not on statin therapy due to previously reported muscle weakness. -Continue aspirin, Plavix.   CKD stage III: Chronic and stable.  Continue monitor.  Hyperlipidemia: Not on statin therapy due to previously reported muscle weakness.  Type 2 diabetes: -Sensitive SSI -Check A1c  Stage I pressure ulcer of the sacrum: -Consult to wound care  DVT prophylaxis: Subcutaneous heparin Code Status: DNR confirmed with patient and son at bedside Family Communication: Discussed with son at bedside [facility] PPM in place Consults called: None Admission status: Admit - It is my clinical opinion that admission to INPATIENT is reasonable and necessary because of the expectation that this patient will require hospital care that crosses at least 2 midnights to treat this condition based on the medical complexity of the problems presented.  Given the aforementioned information, the predictability of an adverse outcome is felt to be significant.     Darreld McleanVishal Arlita Buffkin MD [facility]  If 7PM-7AM, please contact night-coverage [email or web address]   06/03/2019, 8:38 PM

## 2019-06-03 NOTE — Progress Notes (Signed)
A consult was received from an ED physician for Vancomycin per pharmacy dosing.  The patient's profile has been reviewed for ht/wt/allergies/indication/available labs.    A one time order has been placed for Vancomycin 1gm. No current weight.  Further antibiotics/pharmacy consults should be ordered by admitting physician if indicated.                       Thank you,  Minda Ditto 06/03/2019  3:59 PM

## 2019-06-03 NOTE — Progress Notes (Signed)
Pharmacy Antibiotic Note  Vivika Poythress is a 85 y.o. female admitted on 06/03/2019 with wound infection.  Pharmacy has been consulted for vanc dosing.  Plan: Vanc 1g x 1 at 1711 in ED, give another 500mg  vanc to = 1500mg  total for 1st dose. Then start vanc 1500mg  IV q48 - goal AUC 400-550   Height: 5\' 6"  (167.6 cm) Weight: 164 lb 7.4 oz (74.6 kg) IBW/kg (Calculated) : 59.3  Temp (24hrs), Avg:99.1 F (37.3 C), Min:99.1 F (37.3 C), Max:99.1 F (37.3 C)  Recent Labs  Lab 05/31/19 1414 06/03/19 1548  WBC 13.5* 15.7*  CREATININE 1.13* 1.07*  LATICACIDVEN  --  1.2    Estimated Creatinine Clearance: 33.2 mL/min (A) (by C-G formula based on SCr of 1.07 mg/dL (H)).    Allergies  Allergen Reactions  . Statins Other (See Comments)    Muscle weakness/pain  . Other Other (See Comments)    Reductase Inhibitors- "Allergic," per MAR/reaction not recalled by the patient "Dye"- "Allergic," per MAR/reaction not recalled by the patient  . Amlodipine Other (See Comments)    Unknown   . Celecoxib Nausea Only and Other (See Comments)  . Codeine Other (See Comments)    "Allergic," per MAR/reaction not recalled by the patient  . Naproxen Other (See Comments)    Unknown   . Piroxicam Other (See Comments)    "Allergic," per MAR/reaction not recalled by the patient    Thank you for allowing pharmacy to be a part of this patient's care.  Kara Mead 06/03/2019 7:46 PM

## 2019-06-04 ENCOUNTER — Inpatient Hospital Stay (HOSPITAL_COMMUNITY): Payer: Medicare Other

## 2019-06-04 ENCOUNTER — Encounter (HOSPITAL_COMMUNITY): Payer: Self-pay | Admitting: *Deleted

## 2019-06-04 ENCOUNTER — Other Ambulatory Visit: Payer: Self-pay

## 2019-06-04 ENCOUNTER — Encounter (HOSPITAL_COMMUNITY): Payer: Medicare Other

## 2019-06-04 DIAGNOSIS — Z7189 Other specified counseling: Secondary | ICD-10-CM

## 2019-06-04 DIAGNOSIS — I10 Essential (primary) hypertension: Secondary | ICD-10-CM

## 2019-06-04 DIAGNOSIS — Z515 Encounter for palliative care: Secondary | ICD-10-CM

## 2019-06-04 DIAGNOSIS — N183 Chronic kidney disease, stage 3 (moderate): Secondary | ICD-10-CM

## 2019-06-04 DIAGNOSIS — I96 Gangrene, not elsewhere classified: Secondary | ICD-10-CM

## 2019-06-04 DIAGNOSIS — Z8673 Personal history of transient ischemic attack (TIA), and cerebral infarction without residual deficits: Secondary | ICD-10-CM

## 2019-06-04 DIAGNOSIS — E782 Mixed hyperlipidemia: Secondary | ICD-10-CM

## 2019-06-04 DIAGNOSIS — L89151 Pressure ulcer of sacral region, stage 1: Secondary | ICD-10-CM | POA: Diagnosis present

## 2019-06-04 DIAGNOSIS — I2510 Atherosclerotic heart disease of native coronary artery without angina pectoris: Secondary | ICD-10-CM

## 2019-06-04 DIAGNOSIS — I5032 Chronic diastolic (congestive) heart failure: Secondary | ICD-10-CM

## 2019-06-04 DIAGNOSIS — E1149 Type 2 diabetes mellitus with other diabetic neurological complication: Secondary | ICD-10-CM

## 2019-06-04 DIAGNOSIS — I739 Peripheral vascular disease, unspecified: Secondary | ICD-10-CM

## 2019-06-04 LAB — CBG MONITORING, ED
Glucose-Capillary: 131 mg/dL — ABNORMAL HIGH (ref 70–99)
Glucose-Capillary: 130 mg/dL — ABNORMAL HIGH (ref 70–99)

## 2019-06-04 LAB — GLUCOSE, CAPILLARY
Glucose-Capillary: 132 mg/dL — ABNORMAL HIGH (ref 70–99)
Glucose-Capillary: 147 mg/dL — ABNORMAL HIGH (ref 70–99)
Glucose-Capillary: 259 mg/dL — ABNORMAL HIGH (ref 70–99)
Glucose-Capillary: 145 mg/dL — ABNORMAL HIGH (ref 70–99)
Glucose-Capillary: 210 mg/dL — ABNORMAL HIGH (ref 70–99)

## 2019-06-04 LAB — MRSA PCR SCREENING: MRSA by PCR: NEGATIVE

## 2019-06-04 MED ORDER — FENTANYL CITRATE (PF) 100 MCG/2ML IJ SOLN
50.0000 ug | INTRAMUSCULAR | Status: DC | PRN
  Administered 2019-06-04 – 2019-06-05 (×5): 50 ug via INTRAVENOUS
  Filled 2019-06-04 (×5): qty 2

## 2019-06-04 MED ORDER — OXYCODONE HCL 5 MG PO TABS
2.5000 mg | ORAL_TABLET | Freq: Every day | ORAL | Status: DC
  Administered 2019-06-04: 2.5 mg via ORAL
  Filled 2019-06-04: qty 1

## 2019-06-04 NOTE — Consult Note (Addendum)
Hospital Consult    Reason for Consult:  Gangrene toes left foot Requesting Physician:  Rama MRN #:  [id number]  History of Present Illness: This is a 85 y.o. female who was admitted to the hospital yesterday for evaluation of her left foot.  She has been treated with Bactrim for this and followed by podiatry.  Son in the room with pt.  He states that he has not seen his mom since March due to the pandemic.  He states that the podiatrist has not been able to come see the pt at [facility] also b/c of the Pandemic.  The pt was placed on Bactrim for her foot.  On Sunday, she was having some abdominal pain and was brought to the ER at [facility].  It was felt this was due to the Bactrim and it was discontinued.  The son asked the MD about how her feet looked and MD said he would have to check them and came back and told the son they were "healing nicely".  Son states that on Monday, the podiatrist came out on Monday to evaluate the pt.  She was brought to the hospital yesterday due to the foot wounds and pain.  When asked, the pt states that it feels better to hang her foot over the side of the bed.  She can only tell me that she has had pain in her feet since the pandemic started but can't pinpoint an exact time.  Her son states that she does not get around much.  Pt states she has a wheelchair and a walker but hasn't gotten around as well over the past couple of months.   She was seen by Dr. Donnetta Hutching a little over a year ago for painful right foot.    Pt has hx of chronic CHF, SSS with PPM, DM, HTN, HLD, CKD 3 (creatinine of 1.07 yesterday) and CVA with left sided weakness.   She resides at [facility] for the past couple of years.  The pt is not (allergy) on a statin for cholesterol management.  The pt is on a daily aspirin.   Other AC:  Plavix The pt is on CCB, BB, ACEI for hypertension.   The pt is diabetic.  On oral agents Tobacco hx:  never  Covid test:  Negative 06/03/2019  Past Medical History:   Diagnosis Date  . CAD (coronary artery disease)   . Diabetes mellitus without complication ([place])   . Hyperlipidemia   . Hypertension   . Pacemaker   . Renal artery stenosis ([facility])    s/p bilateral stents  . Sick sinus syndrome ([place])   . Stroke [facility])    2 previous cva's    Past Surgical History:  Procedure Laterality Date  . PACEMAKER PLACEMENT  05/15/2014   MDT Advisa MRI implanted at [facility] for sick sinus syndrome    Allergies  Allergen Reactions  . Statins Other (See Comments)    Muscle weakness/pain  . Other Other (See Comments)    Reductase Inhibitors- "Allergic," per MAR/reaction not recalled by the patient "Dye"- "Allergic," per MAR/reaction not recalled by the patient  . Amlodipine Other (See Comments)    Unknown   . Celecoxib Nausea Only and Other (See Comments)  . Codeine Other (See Comments)    "Allergic," per MAR/reaction not recalled by the patient  . Naproxen Other (See Comments)    Unknown   . Piroxicam Other (See Comments)    "Allergic," per MAR/reaction not recalled by the patient  Prior to Admission medications   Medication Sig Start Date End Date Taking? Authorizing Provider  acetaminophen (TYLENOL) 500 MG tablet Take 500 mg by mouth 2 (two) times a day.    Yes [provider]  amLODipine (NORVASC) 10 MG tablet Take 1 tablet (10 mg total) by mouth 2 (two) times daily. 10mg  in the am and 5mg  in the pm Patient taking differently: Take 10 mg by mouth daily.  08/18/18  Yes Jodelle GrossLawrence, Kathryn M, NP  amLODipine (NORVASC) 5 MG tablet Take 5 mg by mouth at bedtime. 03/30/19  Yes [provider]  aspirin 81 MG chewable tablet Chew 1 tablet (81 mg total) by mouth daily. With Food 08/08/18  Yes Emokpae, Courage, MD  cholecalciferol (VITAMIN D3) 25 MCG (1000 UT) tablet Take 1,000 Units by mouth daily.   Yes [provider]  clindamycin (CLEOCIN) 300 MG capsule Take 300 mg by mouth 3 (three) times daily.  06/01/19  Yes [provider]  clopidogrel (PLAVIX) 75 MG tablet Take 1 tablet (75 mg total) by mouth daily. 03/13/18  Yes Angiulli, Mcarthur Rossettianiel J, PA-C  feeding supplement, ENSURE ENLIVE, (ENSURE ENLIVE) LIQD Take 237 mLs by mouth 2 (two) times daily between meals. 05/14/18  Yes Roberto ScalesNettey, Shayla D, MD  furosemide (LASIX) 40 MG tablet Take 80mg  in Am and 40mg  in PM. Patient taking differently: Take 40 mg by mouth at bedtime.  10/22/18  Yes Osvaldo ShipperKrishnan, Gokul, MD  furosemide (LASIX) 80 MG tablet Take 80 mg by mouth daily.   Yes [provider]  glimepiride (AMARYL) 1 MG tablet Take 1 tablet (1 mg total) by mouth daily with breakfast. 03/13/18  Yes Angiulli, Mcarthur Rossettianiel J, PA-C  isosorbide mononitrate (IMDUR) 60 MG 24 hr tablet Take 1 tablet (60 mg total) by mouth daily. 10/11/16  Yes Angiulli, Mcarthur Rossettianiel J, PA-C  lisinopril (PRINIVIL,ZESTRIL) 20 MG tablet Take 1 tablet (20 mg total) by mouth 2 (two) times daily. 03/13/18  Yes Angiulli, Mcarthur Rossettianiel J, PA-C  Magnesium 250 MG TABS Take 250 mg by mouth at bedtime.   Yes [provider]  metoprolol succinate (TOPROL-XL) 100 MG 24 hr tablet Take 1 tablet (100 mg total) by mouth daily. 03/13/18  Yes Angiulli, Mcarthur Rossettianiel J, PA-C  Multiple Vitamin (MULTIVITAMIN WITH MINERALS) TABS tablet Take 1 tablet by mouth daily. 05/14/18  Yes Roberto ScalesNettey, Shayla D, MD  ranolazine (RANEXA) 500 MG 12 hr tablet Take 1 tablet (500 mg total) by mouth 2 (two) times daily. 10/11/16  Yes Angiulli, Mcarthur Rossettianiel J, PA-C  dextromethorphan-guaiFENesin (MUCINEX DM) 30-600 MG 12hr tablet Take 1 tablet by mouth 2 (two) times daily. Patient not taking: Reported on 06/03/2019 05/13/18   Roberto ScalesNettey, Shayla D, MD  nitroGLYCERIN (NITROSTAT) 0.4 MG SL tablet Place 0.4 mg under the tongue every 5 (five) minutes x 3 doses as needed for chest pain (and call 9-1-1 and MD, if no relief).     [provider]  ondansetron (ZOFRAN) 4 MG tablet Take 4 mg by mouth every 6 (six) hours as needed for nausea or vomiting.  05/08/19   [provider]   senna (SENOKOT) 8.6 MG TABS tablet Take 2 tablets (17.2 mg total) by mouth at bedtime. Patient taking differently: Take 2 tablets by mouth every Monday, Wednesday, and Friday at 8 PM.  08/07/18   Shon HaleEmokpae, Courage, MD    Social History   Socioeconomic History  . Marital status: Widowed    Spouse name: Not on file  . Number of children: Not on file  .  Years of education: Not on file  . Highest education level: Not on file  Occupational History  . Not on file  Social Needs  . Financial resource strain: Not on file  . Food insecurity    Worry: Not on file    Inability: Not on file  . Transportation needs    Medical: Not on file    Non-medical: Not on file  Tobacco Use  . Smoking status: Never Smoker  . Smokeless tobacco: Never Used  Substance and Sexual Activity  . Alcohol use: No  . Drug use: Never  . Sexual activity: Not on file  Lifestyle  . Physical activity    Days per week: Not on file    Minutes per session: Not on file  . Stress: Not on file  Relationships  . Social Musician on phone: Not on file    Gets together: Not on file    Attends religious service: Not on file    Active member of club or organization: Not on file    Attends meetings of clubs or organizations: Not on file    Relationship status: Not on file  . Intimate partner violence    Fear of current or ex partner: Not on file    Emotionally abused: Not on file    Physically abused: Not on file    Forced sexual activity: Not on file  Other Topics Concern  . Not on file  Social History Narrative  . Not on file     Family History  Problem Relation Age of Onset  . Lung cancer Sister   . Heart failure Sister   . Heart disease Sister   . Vision loss Sister   . Mental illness Sister   . Hypertension Son   . Vision loss Son   . Vision loss Mother   . Vision loss Father   . Depression Father   . Diabetes Paternal Aunt   . Diabetes Maternal Aunt   . CAD Neg Hx   . Stroke Neg Hx      ROS:  Positive    Negative    All sytems reviewed and are negative  Cardiac:  hx SSS with PPM  HTN  increased lipids  CAD  Vascular:  pain in legs while walking  pain in legs at rest  pain in legs at night  non-healing ulcers  Pulmonary:  productive cough  asthma/wheezing  home O2  Neurologic:  hx of CVA    Hematologic:  hx of cancer  Endocrine:    diabetes  thyroid disease  GI  recent abdominal pain resolved  GU:  CKD (hx)  hx RAS  Psychiatric:  anxiety  depression  Musculoskeletal:  arthritis  joint pain  Integumentary:  rashes  ulcers  Constitutional:  fever  chills   Physical Examination  Vitals:   06/04/19 0300 06/04/19 0805  BP: 132/68 (!) 139/57  Pulse: 65 63  Resp: 15 16  Temp: 98.2 F (36.8 C) 97.9 F (36.6 C)  SpO2: 100% 100%   Body mass index is 26.69 kg/m.  General:  WDWN in NAD Gait: Not observed HENT: WNL, normocephalic Pulmonary: normal non-labored breathing, without Rales, rhonchi,  wheezing Cardiac: regular without carotid bruits Abdomen:  soft, NT/ND, no masses Skin: without rashes Vascular Exam/Pulses: +palpable bilateral radial pulses Unable to palpate pedal pulses Unable to palpate femoral pulses, but may be due to body habitus and the way she is laying in bed.  Extremities:   Left foot  Left foot  Right foot  Right foot  Musculoskeletal: no muscle wasting or atrophy  Neurologic: A&O X 3; slight weakness on the left compared to the right; speech is fluent/normal Psychiatric:  The pt has Normal affect.   CBC    Component Value Date/Time   WBC 15.7 (H) 06/03/2019 1548   RBC 3.02 (L) 06/03/2019 1548   HGB 9.0 (L) 06/03/2019 1548   HCT 28.6 (L) 06/03/2019 1548   PLT 392 06/03/2019 1548   MCV 94.7 06/03/2019 1548   MCH 29.8 06/03/2019 1548   MCHC 31.5 06/03/2019 1548   RDW 14.2 06/03/2019 1548   LYMPHSABS 1.2 06/03/2019  1548   MONOABS 0.8 06/03/2019 1548   EOSABS 0.0 06/03/2019 1548   BASOSABS 0.0 06/03/2019 1548    BMET    Component Value Date/Time   NA 136 06/03/2019 1548   K 3.9 06/03/2019 1548   CL 93 (L) 06/03/2019 1548   CO2 33 (H) 06/03/2019 1548   GLUCOSE 159 (H) 06/03/2019 1548   BUN 23 06/03/2019 1548   CREATININE 1.07 (H) 06/03/2019 1548   CALCIUM 8.5 (L) 06/03/2019 1548   GFRNONAA 44 (L) 06/03/2019 1548   GFRAA 51 (L) 06/03/2019 1548    COAGS: Lab Results  Component Value Date   INR 1.1 06/03/2019   INR 1.00 02/22/2018     Non-Invasive Vascular Imaging:   ABI's ordered.   ASSESSMENT/PLAN: This is a 85 y.o. female with gangrenous toes left foot as well as right lateral ankle and small area on right heel  -pt with non healing wounds left foot and small area on the right heel and lateral ankle.  Looks as if a sheet of snow has been placed on the toes of the left foot.  Unable to remove.  Unable to palpate pulses and doppler not available.  Pt scheduled for ABI's today.  Pt also scheduled for MRI as well.   -may need arteriogram to assess blood flow.  Dr. Chestine Sporelark to see pt later this morning.     Doreatha MassedSamantha Rhyne, PA-C [facility] [phone]  85 year old female currently in a nursing facility that vascular was consulted for left lower extremity tissue loss and rest pain pain.  On exam she actually has bilateral lower extremity tissue loss but only has rest pain on the left.  Her ABIs last year on 03/11/18 were 0 with no toe tracings either and 0.49 on the right.  She has no palpable femoral pulses bilaterally suggesting severe aortoiliac disease.  Discussed with her and her son that this could be a complex management problem and the next step would typically be CTA to evaluate surgical options that could potentially include aortobifem versus axillofemoral versus other intervention.  That being said she is 85 years old and sounds like has had really not ambulated for  some time, other than some slight transfers on her legs.  Discussed alternatives could be palliative wound care versus amputation.  I think amputation is high risk in her because she has no left femoral pulse and on her arterial duplex only has a monophasic waveform in the profunda.  Discussed with her son that certainly complication could be amputation that fails to heal and then ultimately ends up requiring higher level revision and/or hip disarticulation.  Both the patient and her son state they are not interested in complex surgical intervention and/or amputation at this time.  I think we came to the agreement that palliative care  consult and/or palliative wound care would likely be in her best interest at this time given our discussion.  Given her age of 85 with all of her medical comorbidities I think she is a very poor surgical candidate overall.  Son asked about toe amputations alone and I discussed they would not heal given her blood flow.   Cephus Shellinghristopher J. Samik Balkcom, MD [facility] [place] Office: [phone] Pager: [phone]  Cephus Shellinghristopher J Jossalin Chervenak

## 2019-06-04 NOTE — ED Notes (Signed)
Sub q dose heparin rescheduled  by pharma

## 2019-06-04 NOTE — ED Notes (Addendum)
ED TO INPATIENT HANDOFF REPORT  ED Nurse Name and Phone #: jon wled   S Name/Age/Gender Charlene GriffinsNell Greene 85 y.o. female Room/Bed: WA25/WA25  Code Status   Code Status: DNR  Home/SNF/Other Nursing Home Patient oriented to: self , place, time and person ( alert x 4 )  Is this baseline? Yes   Triage Complete: Triage complete  Chief Complaint Bilateral Foot Infection  Triage Note Patient arrived by EMS from [facility]. PT was seen for infection on feet x 2 weeks ago. Pt was prescribed antibiotics.   PT has green pus coming out of both feet per EMS.   Hx of DM.    Allergies Allergies  Allergen Reactions  . Statins Other (See Comments)    Muscle weakness/pain  . Other Other (See Comments)    Reductase Inhibitors- "Allergic," per MAR/reaction not recalled by the patient "Dye"- "Allergic," per MAR/reaction not recalled by the patient  . Amlodipine Other (See Comments)    Unknown   . Celecoxib Nausea Only and Other (See Comments)  . Codeine Other (See Comments)    "Allergic," per MAR/reaction not recalled by the patient  . Naproxen Other (See Comments)    Unknown   . Piroxicam Other (See Comments)    "Allergic," per MAR/reaction not recalled by the patient    Level of Care/Admitting Diagnosis ED Disposition    ED Disposition Condition Comment   Admit  [facility] [[id number]]  Level of Care: Med-Surg [16]  Covid Evaluation: Confirmed COVID Negative  Diagnosis: Dry gangrene [facility](HCC) [[id number]]) [[id number]]  Admitting Physician: Charlsie QuestPATEL, VISHAL R [[id number]]  Attending Physician: Charlsie QuestPATEL, VISHAL R [[id number]]  Estimated length of stay: past midnight tomorrow  Certification:: I certify this patient will need inpatient services for at least 2 midnights  PT Class (Do Not Modify): Inpatient [101]  PT Acc Code (Do Not Modify): Private [1]       B Medical/Surgery History Past Medical History:  Diagnosis Date  . CAD (coronary artery disease)   .  Diabetes mellitus without complication (HCC)   . Hyperlipidemia   . Hypertension   . Pacemaker   . Renal artery stenosis (HCC)    s/p bilateral stents  . Sick sinus syndrome (HCC)   . Stroke [facility](HCC)    2 previous cva's   Past Surgical History:  Procedure Laterality Date  . PACEMAKER PLACEMENT  05/15/2014   MDT Advisa MRI [facility] for sick sinus syndrome     A IV Location/Drains/Wounds Patient Lines/Drains/Airways Status   Active Line/Drains/Airways    Name:   Placement date:   Placement time:   Site:   Days:   Peripheral IV 05/31/19 Right Antecubital   05/31/19    1454    Antecubital   4   Peripheral IV 06/03/19 Right Antecubital   06/03/19    1545    Antecubital   1   External Urinary Catheter   10/19/18    0233    -   228   External Urinary Catheter   04/30/19    0018    -   35   External Urinary Catheter   05/31/19    1220    -   4   Pressure Injury 05/08/18 Stage I -  Intact skin with non-blanchable redness of a localized area usually over a bony prominence. sl redness to sacral area   05/08/18    0000     392   Pressure Injury 08/05/18 Stage I -  Intact skin with non-blanchable redness of a localized area usually over a bony prominence. redden   08/05/18    2000     303          Intake/Output Last 24 hours  Intake/Output Summary (Last 24 hours) at 06/04/2019 0100 Last data filed at 06/03/2019 1821 Gross per 24 hour  Intake 300 ml  Output -  Net 300 ml    Labs/Imaging Results for orders placed or performed during the hospital encounter of 06/03/19 (from the past 48 hour(s))  Lactic acid, plasma     Status: None   Collection Time: 06/03/19  3:48 PM  Result Value Ref Range   Lactic Acid, Venous 1.2 0.5 - 1.9 mmol/L    Comment: Performed at [facility], [address], [place], [place] [address]  Comprehensive metabolic panel     Status: Abnormal   Collection Time: 06/03/19  3:48 PM  Result Value Ref Range   Sodium 136 135 - 145 mmol/L    Potassium 3.9 3.5 - 5.1 mmol/L   Chloride 93 (L) 98 - 111 mmol/L   CO2 33 (H) 22 - 32 mmol/L   Glucose, Bld 159 (H) 70 - 99 mg/dL   BUN 23 8 - 23 mg/dL   Creatinine, Ser 1.91 (H) 0.44 - 1.00 mg/dL   Calcium 8.5 (L) 8.9 - 10.3 mg/dL   Total Protein 6.3 (L) 6.5 - 8.1 g/dL   Albumin 2.6 (L) 3.5 - 5.0 g/dL   AST 12 (L) 15 - 41 U/L   ALT 14 0 - 44 U/L   Alkaline Phosphatase 103 38 - 126 U/L   Total Bilirubin 0.2 (L) 0.3 - 1.2 mg/dL   GFR calc non Af Amer 44 (L) >60 mL/min   GFR calc Af Amer 51 (L) >60 mL/min   Anion gap 10 5 - 15    Comment: Performed at [facility], [address] [place], [place] [address]  CBC WITH DIFFERENTIAL     Status: Abnormal   Collection Time: 06/03/19  3:48 PM  Result Value Ref Range   WBC 15.7 (H) 4.0 - 10.5 K/uL   RBC 3.02 (L) 3.87 - 5.11 MIL/uL   Hemoglobin 9.0 (L) 12.0 - 15.0 g/dL   HCT 56.2 (L) 13.0 - 86.5 %   MCV 94.7 80.0 - 100.0 fL   MCH 29.8 26.0 - 34.0 pg   MCHC 31.5 30.0 - 36.0 g/dL   RDW 78.4 69.6 - 29.5 %   Platelets 392 150 - 400 K/uL   nRBC 0.0 0.0 - 0.2 %   Neutrophils Relative % 86 %   Neutro Abs 13.4 (H) 1.7 - 7.7 K/uL   Lymphocytes Relative 7 %   Lymphs Abs 1.2 0.7 - 4.0 K/uL   Monocytes Relative 5 %   Monocytes Absolute 0.8 0.1 - 1.0 K/uL   Eosinophils Relative 0 %   Eosinophils Absolute 0.0 0.0 - 0.5 K/uL   Basophils Relative 0 %   Basophils Absolute 0.0 0.0 - 0.1 K/uL   Immature Granulocytes 2 %   Abs Immature Granulocytes 0.24 (H) 0.00 - 0.07 K/uL    Comment: Performed at [facility], [address], [place], [place] [address]  APTT     Status: None   Collection Time: 06/03/19  3:48 PM  Result Value Ref Range   aPTT 34 24 - 36 seconds    Comment: Performed at [facility], [address] [place], [place]  [place]     Status: None   Collection Time: 06/03/19  3:48 PM  Result Value Ref Range   Prothrombin Time 13.6 11.4 - 15.2 seconds   INR 1.1  0.8 - 1.2    Comment: (NOTE) INR goal varies based on device and disease states. Performed at [facility], [place] [address] [place], [place] [address]   Hemoglobin A1c     Status: Abnormal   Collection Time: 06/03/19  3:48 PM  Result Value Ref Range   Hgb A1c MFr Bld 6.6 (H) 4.8 - 5.6 %    Comment: (NOTE) Pre diabetes:          5.7%-6.4% Diabetes:              >6.4% Glycemic control for   <7.0% adults with diabetes    Mean Plasma Glucose 142.72 mg/dL    Comment: Performed at [facility] [address], [place], [place] [address]  SARS Coronavirus 2 (CEPHEID - Performed in [place] hospital lab), Hosp Order     Status: None   Collection Time: 06/03/19  6:20 PM   Specimen: Nasopharyngeal Swab  Result Value Ref Range   SARS Coronavirus 2 NEGATIVE NEGATIVE    Comment: (NOTE) If result is NEGATIVE SARS-CoV-2 target nucleic acids are NOT DETECTED. The SARS-CoV-2 RNA is generally detectable in upper and lower  respiratory specimens during the acute phase of infection. The lowest  concentration of SARS-CoV-2 viral copies this assay can detect is 250  copies / mL. A negative result does not preclude SARS-CoV-2 infection  and should not be used as the sole basis for treatment or other  patient management decisions.  A negative result may occur with  improper specimen collection / handling, submission of specimen other  than nasopharyngeal swab, presence of viral mutation(s) within the  areas targeted by this assay, and inadequate number of viral copies  (<250 copies / mL). A negative result must be combined with clinical  observations, patient history, and epidemiological information. If result is POSITIVE SARS-CoV-2 target nucleic acids are DETECTED. The SARS-CoV-2 RNA is generally detectable in upper and lower  respiratory specimens dur ing the acute phase of infection.  Positive  results are indicative of active infection with SARS-CoV-2.  Clinical   correlation with patient history and other diagnostic information is  necessary to determine patient infection status.  Positive results do  not rule out bacterial infection or co-infection with other viruses. If result is PRESUMPTIVE POSTIVE SARS-CoV-2 nucleic acids MAY BE PRESENT.   A presumptive positive result was obtained on the submitted specimen  and confirmed on repeat testing.  While 2019 novel coronavirus  (SARS-CoV-2) nucleic acids may be present in the submitted sample  additional confirmatory testing may be necessary for epidemiological  and / or clinical management purposes  to differentiate between  SARS-CoV-2 and other Sarbecovirus currently known to infect humans.  If clinically indicated additional testing with an alternate test  methodology [id number]) is advised. The SARS-CoV-2 RNA is generally  detectable in upper and lower respiratory sp ecimens during the acute  phase of infection. The expected result is Negative. Fact Sheet for Patients:  StrictlyIdeas.no Fact Sheet for Healthcare Providers: BankingDealers.co.za This test is not yet approved or cleared by the [place] FDA and has been authorized for detection and/or diagnosis of SARS-CoV-2 by FDA under an Emergency Use Authorization (EUA).  This EUA will remain in effect (meaning this test can be used) for the  duration of the COVID-19 declaration under Section 564(b)(1) of the Act, 21 U.S.C. section 360bbb-3(b)(1), unless the authorization is terminated or revoked sooner. Performed at [facility], [address] [place], [place] [address]   Urinalysis, Routine w reflex microscopic     Status: Abnormal   Collection Time: 06/03/19  9:30 PM  Result Value Ref Range   Color, Urine YELLOW YELLOW   APPearance CLOUDY (A) CLEAR   Specific Gravity, Urine 1.010 1.005 - 1.030   pH 8.0 5.0 - 8.0   Glucose, UA NEGATIVE NEGATIVE mg/dL   Hgb urine  dipstick NEGATIVE NEGATIVE   Bilirubin Urine NEGATIVE NEGATIVE   Ketones, ur NEGATIVE NEGATIVE mg/dL   Protein, ur NEGATIVE NEGATIVE mg/dL   Nitrite NEGATIVE NEGATIVE   Leukocytes,Ua LARGE (A) NEGATIVE   RBC / HPF 0-5 0 - 5 RBC/hpf   WBC, UA >50 (H) 0 - 5 WBC/hpf   Bacteria, UA RARE (A) NONE SEEN   Squamous Epithelial / LPF 0-5 0 - 5   Mucus PRESENT    Hyaline Casts, UA PRESENT     Comment: Performed at [facility], [address] [place], [place] [address]   Dg Foot Complete Left  Result Date: 06/03/2019 CLINICAL DATA:  Left foot infection. EXAM: LEFT FOOT - COMPLETE 3+ VIEW COMPARISON:  None. FINDINGS: No osseous destruction or periosteal reaction. No acute fracture or dislocation. Mild first MTP joint osteoarthritis. Remaining joint spaces are relatively preserved. Osteopenia. Diffuse soft tissue swelling. IMPRESSION: 1. Diffuse soft tissue swelling.  No acute osseous abnormality. Electronically Signed   By: Obie DredgeWilliam T Derry M.D.   On: 06/03/2019 16:39   Dg Foot Complete Right  Result Date: 06/03/2019 CLINICAL DATA:  Foot infection. EXAM: RIGHT FOOT COMPLETE - 3+ VIEW COMPARISON:  03/07/2018 FINDINGS: There is no fracture or dislocation or bone destruction. No discrete periosteal reaction. There is dorsal soft tissue swelling of the forefoot. Minimal arthritic changes in the midfoot. IMPRESSION: No radiographic evidence of osteomyelitis.  Soft tissue swelling. Electronically Signed   By: Francene BoyersJames  Maxwell M.D.   On: 06/03/2019 16:40    Pending Labs Unresulted Labs (From admission, onward)    Start     Ordered   06/04/19 0500  Basic metabolic panel  Tomorrow morning,   R     06/03/19 1937   06/04/19 0500  CBC  Tomorrow morning,   R     06/03/19 1937   06/04/19 0500  Creatinine, serum  Daily,   R     06/03/19 1950   06/03/19 1548  Blood Culture (routine x 2)  BLOOD CULTURE X 2,   STAT     06/03/19 1548   06/03/19 1548  Urine culture  ONCE - STAT,   STAT      06/03/19 1548          Vitals/Pain Today's Vitals   06/03/19 2000 06/03/19 2030 06/03/19 2230 06/04/19 0000  BP: 138/81 (!) 133/53 129/69 118/84  Pulse:   66 67  Resp: 13 13 13 13   Temp:      TempSrc:      SpO2:   100% 99%  Weight:      Height:      PainSc:        Isolation Precautions No active isolations  Medications Medications  heparin injection 5,000 Units (has no administration in time range)  acetaminophen (TYLENOL) tablet 650 mg (has no administration in time range)    Or  acetaminophen (TYLENOL) suppository 650  mg (has no administration in time range)  ondansetron (ZOFRAN) tablet 4 mg (has no administration in time range)    Or  ondansetron (ZOFRAN) injection 4 mg (has no administration in time range)  cefTRIAXone (ROCEPHIN) 1 g in sodium chloride 0.9 % 100 mL IVPB (has no administration in time range)  amLODipine (NORVASC) tablet 10 mg (has no administration in time range)  aspirin chewable tablet 81 mg (has no administration in time range)  clopidogrel (PLAVIX) tablet 75 mg (has no administration in time range)  feeding supplement (ENSURE ENLIVE) (ENSURE ENLIVE) liquid 237 mL (has no administration in time range)  furosemide (LASIX) tablet 40 mg (has no administration in time range)  isosorbide mononitrate (IMDUR) 24 hr tablet 60 mg (has no administration in time range)  metoprolol succinate (TOPROL-XL) 24 hr tablet 100 mg (has no administration in time range)  lisinopril (ZESTRIL) tablet 20 mg (has no administration in time range)  ranolazine (RANEXA) 12 hr tablet 500 mg (has no administration in time range)  insulin aspart (novoLOG) injection 0-9 Units (has no administration in time range)  vancomycin (VANCOCIN) 1,500 mg in sodium chloride 0.9 % 500 mL IVPB (has no administration in time range)  vancomycin (VANCOCIN) IVPB 1000 mg/200 mL premix (0 mg Intravenous Stopped 06/03/19 1821)  cefTRIAXone (ROCEPHIN) 2 g in sodium chloride 0.9 % 100 mL IVPB (0 g  Intravenous Stopped 06/03/19 1711)  fentaNYL (SUBLIMAZE) injection 50 mcg (50 mcg Intravenous Given 06/03/19 1634)  vancomycin (VANCOCIN) 500 mg in sodium chloride 0.9 % 100 mL IVPB (0 mg Intravenous Stopped 06/03/19 2156)    Mobility non-ambulatory High fall risk   Focused Assessments Skin assessment/ cellulitis w gangrene of left foot . Osteopenia and osteoarthritis   R Recommendations: See Admitting Provider Note  Report given to:   Additional Notes:

## 2019-06-04 NOTE — Consult Note (Signed)
Consultation Note Date: 06/04/2019   Patient Name: Charlene Greene  DOB: 02/21/1922  MRN: [id number]  Age / Sex: 85 y.o., female  PCP: [facility] Referring Physician: Rama, [place]ia Maxon, MD  Reason for Consultation: Establishing goals of care  HPI/Patient Profile: 85 y.o. female  with past medical history of diastolic CHF, CAD, HTN, HLD, diabetes type 2, sick sinus syndrome s/p pacemaker, renal artery stenosis s/p bilateral stents, h/o stroke x 2 admitted on 06/03/2019 with worsening left foot infection. Found to have severe vascular disease and dry gangrene. Poor candidate for amputation and other options are limited. Family considering palliative options.   Clinical Assessment and Goals of Care: I met today with Charlene Greene and her son, Charlene Greene, at bedside. Charlene Greene tells me about their experience with unable to see his mother since March as she has been isolated at her [facility]. She has had some general decline with becoming more bed bound, poorer appetite, sacral wound and wounds to feet. Charlene Greene did not realize that the podiatrist had not seen her since March as well.   They are frustrated as she was in the ED a few days ago but was sent home and continued with worsening foot pain. They have spoken with Dr. Carlis Abbott today and understand that options are very limited. Charlene Greene has confirmed that she does not want an amputation. I further explained to Charlene Greene that unfortunately her problem is blood flow and it would difficult to heal from a surgery. He understands the gravity of the operation and that this is not in her best interest.   Charlene Greene tells [place] that she is ready to go and ready to be with her husband (she has buried 3 husbands). She is at peace. Her greatest concern is for her family and that she will miss them. I reassured her that palliative care and use of hospice is to support herself to ensure  she is comfortable and feeling as good as she is able with the time she has left. We also hope to support her family during this time as much as we support her.   We discussed plan for return to [facility] with increased support and hospice to follow. Interested in potential transition to hospice facility when < 2 weeks. At this time she is still eating and very alert so not sure she is appropriate for hospice facility at this stage - discussed this with Charlene Greene and he agrees.   Primary Decision Maker PATIENT with assistance of son Charlene Greene (will reach out to son [name] tomorrow to include in conversation)    SUMMARY OF RECOMMENDATIONS   - DNR in place - Likely transition back to [facility] with hospice in place Charlene Greene says they are allowing visitors when patients at EOL)  Code Status/Advance Care Planning:  DNR   Symptom Management:   Pain: Trial low dose oxycodone tonight to assist with pain control. Will access tolerance tomorrow and consider this as a transition off of fentanyl.   Palliative Prophylaxis:   Bowel  Regimen, Delirium Protocol, Frequent Pain Assessment, Palliative Wound Care and Turn Reposition  Additional Recommendations (Limitations, Scope, Preferences):  Avoid Hospitalization and Full Comfort Care  Psycho-social/Spiritual:   Desire for further Chaplaincy support:yes  Additional Recommendations: Caregiving  Support/Resources, Education on Hospice and Grief/Bereavement Support  Prognosis:   Weeks to months likely. High risk for acute decompensation with progression of gangrene to sepsis.   Discharge Planning: Return to [facility] with hospice to follow.      Primary Diagnoses: Present on Admission: . Cellulitis of left foot . Chronic diastolic congestive heart failure ([place]) . Hyperlipidemia . Essential hypertension . Stage 3 chronic kidney disease ([place]) . CAD (coronary artery disease) . Dry gangrene ([place]) . Pressure ulcer of sacral region,  stage 1   I have reviewed the medical record, interviewed the patient and family, and examined the patient. The following aspects are pertinent.  Past Medical History:  Diagnosis Date  . CAD (coronary artery disease)   . Diabetes mellitus without complication ([place])   . Hyperlipidemia   . Hypertension   . Pacemaker   . Renal artery stenosis (HCC)    s/p bilateral stents  . Sick sinus syndrome ([place])   . Stroke [facility])    2 previous cva's   Social History   Socioeconomic History  . Marital status: Widowed    Spouse name: Not on file  . Number of children: Not on file  . Years of education: Not on file  . Highest education level: Not on file  Occupational History  . Not on file  Social Needs  . Financial resource strain: Not on file  . Food insecurity    Worry: Not on file    Inability: Not on file  . Transportation needs    Medical: Not on file    Non-medical: Not on file  Tobacco Use  . Smoking status: Never Smoker  . Smokeless tobacco: Never Used  Substance and Sexual Activity  . Alcohol use: No  . Drug use: Never  . Sexual activity: Not on file  Lifestyle  . Physical activity    Days per week: Not on file    Minutes per session: Not on file  . Stress: Not on file  Relationships  . Social Herbalist on phone: Not on file    Gets together: Not on file    Attends religious service: Not on file    Active member of club or organization: Not on file    Attends meetings of clubs or organizations: Not on file    Relationship status: Not on file  Other Topics Concern  . Not on file  Social History Narrative  . Not on file   Family History  Problem Relation Age of Onset  . Lung cancer Sister   . Heart failure Sister   . Heart disease Sister   . Vision loss Sister   . Mental illness Sister   . Hypertension Son   . Vision loss Son   . Vision loss Mother   . Vision loss Father   . Depression Father   . Diabetes Paternal Aunt   . Diabetes Maternal  Aunt   . CAD Neg Hx   . Stroke Neg Hx    Scheduled Meds: . amLODipine  10 mg Oral Daily  . aspirin  81 mg Oral Daily  . clopidogrel  75 mg Oral Daily  . feeding supplement (ENSURE ENLIVE)  237 mL Oral BID BM  . furosemide  40  mg Oral QHS  . heparin  5,000 Units Subcutaneous Q8H  . insulin aspart  0-9 Units Subcutaneous TID WC  . isosorbide mononitrate  60 mg Oral Daily  . lisinopril  20 mg Oral BID  . metoprolol succinate  100 mg Oral Daily  . ranolazine  500 mg Oral BID   Continuous Infusions: . cefTRIAXone (ROCEPHIN)  IV    . [START ON 06/05/2019] vancomycin     PRN Meds:.acetaminophen **OR** acetaminophen, fentaNYL (SUBLIMAZE) injection, ondansetron **OR** ondansetron (ZOFRAN) IV Allergies  Allergen Reactions  . Statins Other (See Comments)    Muscle weakness/pain  . Other Other (See Comments)    Reductase Inhibitors- "Allergic," per MAR/reaction not recalled by the patient "Dye"- "Allergic," per MAR/reaction not recalled by the patient  . Amlodipine Other (See Comments)    Unknown   . Celecoxib Nausea Only and Other (See Comments)  . Codeine Other (See Comments)    "Allergic," per MAR/reaction not recalled by the patient  . Naproxen Other (See Comments)    Unknown   . Piroxicam Other (See Comments)    "Allergic," per MAR/reaction not recalled by the patient   Review of Systems  Constitutional: Positive for activity change, appetite change and fatigue.  Respiratory: Negative for shortness of breath.   Musculoskeletal:       Bilat foot pain  Neurological: Positive for weakness.    Physical Exam Vitals signs and nursing note reviewed.  Constitutional:      General: She is not in acute distress.    Appearance: She is ill-appearing.     Comments: But sleepy  Cardiovascular:     Rate and Rhythm: Bradycardia present.  Pulmonary:     Effort: Pulmonary effort is normal. No tachypnea, accessory muscle usage or respiratory distress.  Abdominal:     General: Abdomen  is flat.  Neurological:     Mental Status: She is alert and oriented to person, place, and time.     Comments: A little forgetful but aware of situation     Vital Signs: BP (!) 114/45   Pulse (!) 59   Temp 97.7 F (36.5 C) (Oral)   Resp 16   Ht _0  (1.676 m)   Wt 75 kg   SpO2 100%   BMI 26.69 kg/m  Pain Scale: 0-10 POSS *See Group Information*: 1-Acceptable,Awake and alert Pain Score: 6    SpO2: SpO2: 100 % O2 Device:SpO2: 100 % O2 Flow Rate: .O2 Flow Rate (L/min): 3 L/min  IO: Intake/output summary:   Intake/Output Summary (Last 24 hours) at 06/04/2019 1537 Last data filed at 06/04/2019 0900 Gross per 24 hour  Intake 540 ml  Output 1000 ml  Net -460 ml    LBM: Last BM Date: 06/02/19 Baseline Weight: Weight: 74.6 kg Most recent weight: Weight: 75 kg     Palliative Assessment/Data: 30%     Time In: 1600 Time Out: 1710 Time Total: 70 min Greater than 50%  of this time was spent counseling and coordinating care related to the above assessment and plan.  Signed by: Vinie Sill, NP Palliative Medicine Team Pager # [phone] (M-F 8a-5p) Team Phone # [phone] (Nights/Weekends)

## 2019-06-04 NOTE — Progress Notes (Addendum)
Progress Note    Charlene Greene  ZOX:[id number] DOB: [birth year]  DOA: 06/03/2019 PCP: [facility]    Brief Narrative:   Chief complaint: Follow-up left foot infection.  Medical records reviewed and are as summarized below:  Charlene Greene is an 85 y.o. female with a PMH of CAD, chronic diastolic CHF, SSS status post pacemaker, history of CVA with residual left-sided weakness, type 2 diabetes, hypertension, hyperlipidemia, and stage III chronic kidney disease who was admitted 06/03/2019 for evaluation of infection of her left foot.  At baseline, the patient lives in an assisted living facility and reports that she ambulates to the bathroom, but has not ambulated in the last 2 weeks secondary to pain in her feet.  Assessment/Plan:   Principal Problem:   Cellulitis and dry gangrene of left foot Plain films personally reviewed and show soft tissue swelling but no osseous deformities suggestive of osteomyelitis.  Wounds thought to be due to underlying PVD.  Failed outpatient therapy with Bactrim.  Currently being managed with IV vancomycin and ceftriaxone.  MRI pending.  I have spoken with Dr. Chestine Sporelark of vascular surgery since gangrene is present with left 3-5 toes with full-thickness wounds/eschar and green drainage.  Will obtain ABIs and await further recommendations. Given advanced age, will also ask palliative care to assess goals of care.    Active Problems:   Diabetes mellitus (HCC), controlled, with circulatory complications Currently being managed with insulin sensitive SSI 3 times daily.  CBGs 130-147.  Hemoglobin A1c 6.6% indicating good outpatient glycemic control.    Hyperlipidemia Not managed with statins due to intolerance.    Stage 3 chronic kidney disease (HCC) Chronic, monitor closely while on IV vancomycin.    Chronic diastolic congestive heart failure (HCC) Euvolemic on admission.  Continue home Lasix and monitor volume status closely.  Remains euvolemic  on exam today.    Essential hypertension Continue Norvasc, Lasix, lisinopril and metoprolol.  Blood pressure controlled.    History of CVA (cerebrovascular accident) Continue risk factor modification.    CAD (coronary artery disease) Continue aspirin, Plavix and Imdur.    Stage I pressure injury to sacrum, midline Evaluated by wound care nurse, continue wound care recommendations.  Body mass index is 26.69 kg/m.   Family Communication/Anticipated D/C date and plan/Code Status   DVT prophylaxis: Heparin ordered. Code Status: DNR Family Communication: Son updated at the bedside. Disposition Plan: Remains inpatient appropriate given dry gangrene to the left foot with superimposed cellulitis in the setting of PVD requiring further assessment with possible intervention.   Medical Consultants:    Vascular Surgery  Palliative Care   Anti-Infectives:    Vancomycin 06/03/2019--->  Rocephin 06/03/2019--->  Subjective:   Patient reports that she has had significant left foot pain.  Has not been able to ambulate in 2 weeks because of the pain.  Normally ambulates around room/to the bathroom.  No subjective fevers.  No current complaints of nausea/vomiting.  No shortness of breath.  Objective:    Vitals:   06/04/19 0222 06/04/19 0300 06/04/19 0500 06/04/19 0805  BP: 129/60 132/68  (!) 139/57  Pulse:  65  63  Resp:  15  16  Temp:  98.2 F (36.8 C)  97.9 F (36.6 C)  TempSrc:  Oral  Oral  SpO2:  100%  100%  Weight:   75 kg   Height:        Intake/Output Summary (Last 24 hours) at 06/04/2019 0846 Last data filed at 06/04/2019 [id number] Gross  per 24 hour  Intake 300 ml  Output 600 ml  Net -300 ml   Filed Weights   06/03/19 1858 06/04/19 0500  Weight: 74.6 kg 75 kg    Exam: General: Elderly female in no acute distress. Cardiovascular: Heart sounds show a regular rate, and rhythm. No gallops or rubs. No murmurs. No JVD. Lungs: Clear to auscultation bilaterally with  good air movement. No rales, rhonchi or wheezes. Abdomen: Soft, nontender, nondistended with normal active bowel sounds. No masses. No hepatosplenomegaly. Neurological: Alert and oriented 3. Moves all extremities 4 with equal strength. Cranial nerves II through XII grossly intact. Skin: Warm and dry. No rashes or lesions. Extremities: As pictured below.  Unable to palpate pedal pulses. Psychiatric: Mood and affect are normal. Insight and judgment are fair.           Data Reviewed:   I have personally reviewed following labs and imaging studies:  Labs: Labs show the following:   Basic Metabolic Panel: Recent Labs  Lab 05/31/19 1414 06/03/19 1548  NA 137 136  K 4.6 3.9  CL 92* 93*  CO2 32 33*  GLUCOSE 144* 159*  BUN 27* 23  CREATININE 1.13* 1.07*  CALCIUM 8.8* 8.5*   GFR Estimated Creatinine Clearance: 33.3 mL/min (A) (by C-G formula based on SCr of 1.07 mg/dL (H)). Liver Function Tests: Recent Labs  Lab 06/03/19 1548  AST 12*  ALT 14  ALKPHOS 103  BILITOT 0.2*  PROT 6.3*  ALBUMIN 2.6*   Coagulation profile Recent Labs  Lab 06/03/19 1548  INR 1.1    CBC: Recent Labs  Lab 05/31/19 1414 06/03/19 1548  WBC 13.5* 15.7*  NEUTROABS 11.0* 13.4*  HGB 10.1* 9.0*  HCT 31.5* 28.6*  MCV 94.0 94.7  PLT 343 392   CBG: Recent Labs  Lab 06/03/19 2203 06/04/19 0215 06/04/19 0604 06/04/19 0748  GLUCAP 131* 130* 132* 147*   Hgb A1c: Recent Labs    06/03/19 1548  HGBA1C 6.6*   Sepsis Labs: Recent Labs  Lab 05/31/19 1414 06/03/19 1548  WBC 13.5* 15.7*  LATICACIDVEN  --  1.2    Microbiology Recent Results (from the past 240 hour(s))  SARS Coronavirus 2 (CEPHEID - Performed in [place] hospital lab), Hosp Order     Status: None   Collection Time: 06/03/19  6:20 PM   Specimen: Nasopharyngeal Swab  Result Value Ref Range Status   SARS Coronavirus 2 NEGATIVE NEGATIVE Final    Comment: (NOTE) If result is NEGATIVE SARS-CoV-2 target nucleic  acids are NOT DETECTED. The SARS-CoV-2 RNA is generally detectable in upper and lower  respiratory specimens during the acute phase of infection. The lowest  concentration of SARS-CoV-2 viral copies this assay can detect is 250  copies / mL. A negative result does not preclude SARS-CoV-2 infection  and should not be used as the sole basis for treatment or other  patient management decisions.  A negative result may occur with  improper specimen collection / handling, submission of specimen other  than nasopharyngeal swab, presence of viral mutation(s) within the  areas targeted by this assay, and inadequate number of viral copies  (<250 copies / mL). A negative result must be combined with clinical  observations, patient history, and epidemiological information. If result is POSITIVE SARS-CoV-2 target nucleic acids are DETECTED. The SARS-CoV-2 RNA is generally detectable in upper and lower  respiratory specimens dur ing the acute phase of infection.  Positive  results are indicative of active infection with SARS-CoV-2.  Clinical  correlation with patient history and other diagnostic information is  necessary to determine patient infection status.  Positive results do  not rule out bacterial infection or co-infection with other viruses. If result is PRESUMPTIVE POSTIVE SARS-CoV-2 nucleic acids MAY BE PRESENT.   A presumptive positive result was obtained on the submitted specimen  and confirmed on repeat testing.  While 2019 novel coronavirus  (SARS-CoV-2) nucleic acids may be present in the submitted sample  additional confirmatory testing may be necessary for epidemiological  and / or clinical management purposes  to differentiate between  SARS-CoV-2 and other Sarbecovirus currently known to infect humans.  If clinically indicated additional testing with an alternate test  methodology [phone]) is advised. The SARS-CoV-2 RNA is generally  detectable in upper and lower respiratory sp  ecimens during the acute  phase of infection. The expected result is Negative. Fact Sheet for Patients:  BoilerBrush.com.cyhttps://www.fda.gov/media/136312/download Fact Sheet for Healthcare Providers: https://pope.com/https://www.fda.gov/media/136313/download This test is not yet approved or cleared by the [place] FDA and has been authorized for detection and/or diagnosis of SARS-CoV-2 by FDA under an Emergency Use Authorization (EUA).  This EUA will remain in effect (meaning this test can be used) for the duration of the COVID-19 declaration under Section 564(b)(1) of the Act, 21 U.S.C. section 360bbb-3(b)(1), unless the authorization is terminated or revoked sooner. Performed at [facility], [address] [place], [place] [address]     Procedures and diagnostic studies:  Dg Foot Complete Left  Result Date: 06/03/2019 CLINICAL DATA:  Left foot infection. EXAM: LEFT FOOT - COMPLETE 3+ VIEW COMPARISON:  None. FINDINGS: No osseous destruction or periosteal reaction. No acute fracture or dislocation. Mild first MTP joint osteoarthritis. Remaining joint spaces are relatively preserved. Osteopenia. Diffuse soft tissue swelling. IMPRESSION: 1. Diffuse soft tissue swelling.  No acute osseous abnormality. Electronically Signed   By: Obie DredgeWilliam T Derry M.D.   On: 06/03/2019 16:39   Dg Foot Complete Right  Result Date: 06/03/2019 CLINICAL DATA:  Foot infection. EXAM: RIGHT FOOT COMPLETE - 3+ VIEW COMPARISON:  03/07/2018 FINDINGS: There is no fracture or dislocation or bone destruction. No discrete periosteal reaction. There is dorsal soft tissue swelling of the forefoot. Minimal arthritic changes in the midfoot. IMPRESSION: No radiographic evidence of osteomyelitis.  Soft tissue swelling. Electronically Signed   By: Francene BoyersJames  Maxwell M.D.   On: 06/03/2019 16:40    Medications:   . amLODipine  10 mg Oral Daily  . aspirin  81 mg Oral Daily  . clopidogrel  75 mg Oral Daily  . feeding supplement (ENSURE  ENLIVE)  237 mL Oral BID BM  . furosemide  40 mg Oral QHS  . heparin  5,000 Units Subcutaneous Q8H  . insulin aspart  0-9 Units Subcutaneous TID WC  . isosorbide mononitrate  60 mg Oral Daily  . lisinopril  20 mg Oral BID  . metoprolol succinate  100 mg Oral Daily  . ranolazine  500 mg Oral BID   Continuous Infusions: . cefTRIAXone (ROCEPHIN)  IV    . [START ON 06/05/2019] vancomycin       LOS: 1 day   Hillery Aldohristina Rama  [facility] Pager [phone].   *Please refer to [email or web address], password [email or web address] to get updated schedule on who will round on this patient, as hospitalists switch teams weekly. If 7PM-7AM, please contact night-coverage at [email or web address], password [email or web address] for any overnight needs.  06/04/2019, 8:46 AM

## 2019-06-04 NOTE — ED Notes (Signed)
Report given to [facility]  care link called  Contacted pharm for med's and verification  Paper work complete

## 2019-06-04 NOTE — Consult Note (Addendum)
[facility] Nurse wound consult note Reason for Consult: Consult requested for stage 1 pressure injury to sacrum and left toe wounds.   The [facility] team is performing consults remotely today; reviewed progress notes and photo of left foot in the EMR. Bedside nurses have standing order sets in the EMR to apply foam dressings to stage 1 pressure injury locations to protect from further injury. Wound type: Left 3-5 toes with full thickness wounds and eschar, noted to have green drainage in progress notes.  Dressing procedure/placement/frequency: Orders provided for bedside nurse to apply xeroform gauze and kerlex to protect from further injury. The complex medical condition of gangrene is beyond the scope of practice for [facility] nurses. Topical treatment will be minimally effective; please refer to vascular team for further recommendations.  Please re-consult if further assistance is needed.  Thank-you,  Julien Girt MSN, [place], [place], [place], [place]

## 2019-06-05 DIAGNOSIS — M869 Osteomyelitis, unspecified: Secondary | ICD-10-CM

## 2019-06-05 LAB — BASIC METABOLIC PANEL
Anion gap: 10 (ref 5–15)
BUN: 19 mg/dL (ref 8–23)
CO2: 32 mmol/L (ref 22–32)
Calcium: 8.2 mg/dL — ABNORMAL LOW (ref 8.9–10.3)
Chloride: 98 mmol/L (ref 98–111)
Creatinine, Ser: 0.93 mg/dL (ref 0.44–1.00)
GFR calc Af Amer: >60 mL/min (ref >60)
GFR calc non Af Amer: 53 mL/min — ABNORMAL LOW (ref >60)
Glucose, Bld: 183 mg/dL — ABNORMAL HIGH (ref 70–99)
Potassium: 4.2 mmol/L (ref 3.5–5.1)
Sodium: 140 mmol/L (ref 135–145)

## 2019-06-05 LAB — CBC
HCT: 26 % — ABNORMAL LOW (ref 36.0–46.0)
Hemoglobin: 8.2 g/dL — ABNORMAL LOW (ref 12.0–15.0)
MCH: 29.4 pg (ref 26.0–34.0)
MCHC: 31.5 g/dL (ref 30.0–36.0)
MCV: 93.2 fL (ref 80.0–100.0)
Platelets: 342 10*3/uL (ref 150–400)
RBC: 2.79 MIL/uL — ABNORMAL LOW (ref 3.87–5.11)
RDW: 14 % (ref 11.5–15.5)
WBC: 13.2 10*3/uL — ABNORMAL HIGH (ref 4.0–10.5)
nRBC: 0 % (ref 0.0–0.2)

## 2019-06-05 LAB — GLUCOSE, CAPILLARY
Glucose-Capillary: 161 mg/dL — ABNORMAL HIGH (ref 70–99)
Glucose-Capillary: 211 mg/dL — ABNORMAL HIGH (ref 70–99)
Glucose-Capillary: 215 mg/dL — ABNORMAL HIGH (ref 70–99)

## 2019-06-05 MED ORDER — FENTANYL 12 MCG/HR TD PT72
1.0000 | MEDICATED_PATCH | TRANSDERMAL | Status: DC
  Administered 2019-06-05: 1 via TRANSDERMAL
  Filled 2019-06-05: qty 1

## 2019-06-05 MED ORDER — OXYCODONE HCL 5 MG PO TABS: 2.5000 mg | ORAL_TABLET | ORAL | 0 refills | Status: AC | PRN

## 2019-06-05 MED ORDER — OXYCODONE HCL 5 MG PO TABS
2.5000 mg | ORAL_TABLET | ORAL | Status: DC | PRN
  Administered 2019-06-05: 2.5 mg via ORAL
  Filled 2019-06-05: qty 1

## 2019-06-05 MED ORDER — FENTANYL 12 MCG/HR TD PT72: 1.0000 | MEDICATED_PATCH | TRANSDERMAL | 0 refills | Status: AC

## 2019-06-05 NOTE — Progress Notes (Signed)
Palliative:  85 y.o. female  with past medical history of diastolic CHF, CAD, HTN, HLD, diabetes type 2, sick sinus syndrome s/p pacemaker, renal artery stenosis s/p bilateral stents, h/o stroke x 2 admitted on 06/03/2019 with worsening left foot infection. Found to have severe vascular disease and dry gangrene. Poor candidate for amputation and other options are limited. No desire for surgical intervention. MRI with osteomyelitis and question of desire for IV antibiotics.   I met again today with Charlene Greene and son, Charlene Greene. I discussed care with Charlene Greene prior to visit. MRI results positive for osteomyelitis. Charlene Greene is very clear that she does not want to pursue PICC line and IV antibiotics. She tells me and has previously told Charlene Greene that she does not want this. She is tired and does not want to go through anymore. She is at peace that she is at end of life.   We had a long discussion that with this new information that she would be eligible for hospice facility. Charlene Greene understands that prognosis is poor and family agrees to support her wishes for comfort. We further discussed hospice facility and local facilities and visitor guidelines. We also discussed plans for pain management.   I also called and spoke with son, Charlene Greene, and his wife. [place]. We reviewed Charlene Greene's diagnosis with osteomyelitis and the need for amputation or IV antibiotics. We reviewed her wishes for comfort and my recommendation for hospice facility. They agree with plan and have already been in touch with [facility].   I followed up with Charlene Greene and Charlene Greene once he arrived to bedside. We reviewed plan for comfort care and transition to hospice facility. They were anxious about transition to hospice today but after speaking about plans and goals they agree to go ahead and transition to hospice today.   Exam: Alert, oriented (with some forgetfulness at times). She has  understanding of situation. No distress. Breathing regular, unlabored. Bilat feet without pedal pulses, dressing clean/dry/intact. + pain in bilateral feet.   Plan: - To hospice facility for comfort care.  - Fentanyl patch 12.5 mcg/hr added for long acting pain management (pain improved with fentanyl 50 mcg every 4 hours per family). Management per hospice.   [employer], NP Palliative Medicine Team Pager [phone] (Please see [email or web address] for schedule) Team Phone [phone]    Greater than 50%  of this time was spent counseling and coordinating care related to the above assessment and plan

## 2019-06-05 NOTE — Plan of Care (Signed)
  Problem: Safety: Goal: Ability to remain free from injury will improve Outcome: Progressing   Problem: Skin Integrity: Goal: Risk for impaired skin integrity will decrease Outcome: Progressing   Problem: Elimination: Goal: Will not experience complications related to bowel motility Outcome: Progressing   

## 2019-06-05 NOTE — Progress Notes (Signed)
PROGRESS NOTE    Charlene Greene  WIO:[id number] DOB: [birth year] DOA: 06/03/2019 PCP: [facility]   Brief Narrative:  85 year old with history of severe peripheral vascular disease, coronary artery disease, diastolic CHF, CVA with residual left-sided weakness, diabetes mellitus type 2, sick sinus syndrome status post pacemaker, essential hypertension, CKD stage III, hyperlipidemia admitted for infection of the left foot and right ankle. MRI findings were consistent with osteomyelitis.  He was seen by vascular due to poor circulation was found to have occlusion of femoral artery and only has a monophasic waveform in the profunda.  Patient was deemed not to be a surgical candidate.  Palliative care team was consulted.   Assessment & Plan:   Principal Problem:   Cellulitis of left foot Active Problems:   Diabetes mellitus (HCC)   Hyperlipidemia   Stage 3 chronic kidney disease (HCC)   Chronic diastolic congestive heart failure (HCC)   Essential hypertension   History of CVA (cerebrovascular accident)   CAD (coronary artery disease)   Dry gangrene (HCC)   Pressure ulcer of sacral region, stage 1   Goals of care, counseling/discussion   Palliative care encounter  Left lower extremity cellulitis and osteomyelitis Dry gangrene of the left foot - MRI of the foot is consistent with osteomyelitis.  3 weeks prior to admission, she was placed on oral Bactrim which she has failed.  Patient has been deemed a poor surgical candidate due to very poor circulation and blockage of the femoral artery. -Currently on IV vancomycin and Rocephin. -Patient seen by palliative care- currently ongoing discussions about hospice care. -Routine wound care of the area. -Pain control. -Aspirin  Chronic kidney disease stage III -Appears to be stable.  Continue to monitor.  Hyperlipidemia -Intolerant to statin.  Diabetes mellitus type 2, controlled -Hemoglobin A1c 6.6.  Continue Accu-Cheks and  sliding scale.  Essential hypertension -Norvasc 10 mg orally daily.  Lasix 40 mg daily.  Lisinopril 20 mg twice daily.  Metoprolol 100 mg daily.  Chronic diastolic congestive heart failure - Appears to be euvolemic in nature at this time.  Continue home Lasix regimen.  On isosorbide mononitrate 60 mg daily  History of CVA with residual left-sided weakness  Coronary artery disease -Aspirin and Plavix.  Stage I sacral ulcer. -Routine wound care  DVT prophylaxis: Subcu heparin Code Status: DNR Family Communication: Spoke with patient's son at bedside and also spoke with another son over the phone. Disposition Plan: Maintain hospital stay for IV antibiotics in the meantime awaiting decision about hospice/palliative care.  Consultants:   Vascular surgery  Procedures:   None  Antimicrobials:   Vancomycin day 2  Rocephin day 2   Subjective: Patient does not have any complaints.  Spoke extensively with the patient and son at bedside regarding long-term goals of care.  Also spoke with another son over the phone [name].  Family would like to take time and make decision  Review of Systems Otherwise negative except as per HPI, including: General: Denies fever, chills, night sweats or unintended weight loss. Resp: Denies cough, wheezing, shortness of breath. Cardiac: Denies chest pain, palpitations, orthopnea, paroxysmal nocturnal dyspnea. GI: Denies abdominal pain, nausea, vomiting, diarrhea or constipation GU: Denies dysuria, frequency, hesitancy or incontinence MS: Denies muscle aches, joint pain or swelling Neuro: Denies headache, neurologic deficits (focal weakness, numbness, tingling), abnormal gait Psych: Denies anxiety, depression, SI/HI/AVH Skin: Denies new rashes or lesions ID: Denies sick contacts, exotic exposures, travel  Objective: Vitals:   06/04/19 1544 06/05/19 0014 06/05/19 0350  06/05/19 0815  BP: 114/66 (!) 109/97 (!) 134/59 (!) 139/58  Pulse: 62 69 74 68   Resp: 16 17 18 16   Temp: 98 F (36.7 C) (!) 97.4 F (36.3 C) 97.9 F (36.6 C) 97.9 F (36.6 C)  TempSrc: Oral Oral Oral Oral  SpO2: 99% 95% 96% 98%  Weight:      Height:        Intake/Output Summary (Last 24 hours) at 06/05/2019 1302 Last data filed at 06/05/2019 1149 Gross per 24 hour  Intake 1125.17 ml  Output 1800 ml  Net -674.83 ml   Filed Weights   06/03/19 1858 06/04/19 0500  Weight: 74.6 kg 75 kg    Examination:  General exam: Appears calm and comfortable, chronically illAn elderly Respiratory system: Clear to auscultation. Respiratory effort normal. Cardiovascular system: S1 & S2 heard, RRR. No JVD, murmurs, rubs, gallops or clicks. No pedal edema. Gastrointestinal system: Abdomen is nondistended, soft and nontender. No organomegaly or masses felt. Normal bowel sounds heard. Central nervous system: Alert and oriented. No focal neurological deficits. Extremities: Strength is 4/5 in all extremities. Skin: Bilateral lower extremity dressing noted. Psychiatry: Somewhat poor judgment.  Data Reviewed:   CBC: Recent Labs  Lab 05/31/19 1414 06/03/19 1548 06/05/19 0525  WBC 13.5* 15.7* 13.2*  NEUTROABS 11.0* 13.4*  --   HGB 10.1* 9.0* 8.2*  HCT 31.5* 28.6* 26.0*  MCV 94.0 94.7 93.2  PLT 343 392 342   Basic Metabolic Panel: Recent Labs  Lab 05/31/19 1414 06/03/19 1548 06/05/19 0525  NA 137 136 140  K 4.6 3.9 4.2  CL 92* 93* 98  CO2 32 33* 32  GLUCOSE 144* 159* 183*  BUN 27* 23 19  CREATININE 1.13* 1.07* 0.93  CALCIUM 8.8* 8.5* 8.2*   GFR: Estimated Creatinine Clearance: 38.3 mL/min (by C-G formula based on SCr of 0.93 mg/dL). Liver Function Tests: Recent Labs  Lab 06/03/19 1548  AST 12*  ALT 14  ALKPHOS 103  BILITOT 0.2*  PROT 6.3*  ALBUMIN 2.6*   No results for input(s): LIPASE, AMYLASE in the last 168 hours. No results for input(s): AMMONIA in the last 168 hours. Coagulation Profile: Recent Labs  Lab 06/03/19 1548  INR 1.1    Cardiac Enzymes: No results for input(s): CKTOTAL, CKMB, CKMBINDEX, TROPONINI in the last 168 hours. BNP (last 3 results) No results for input(s): PROBNP in the last 8760 hours. HbA1C: Recent Labs    06/03/19 1548  HGBA1C 6.6*   CBG: Recent Labs  Lab 06/04/19 1143 06/04/19 1603 06/04/19 2105 06/05/19 0811 06/05/19 1132  GLUCAP 259* 145* 210* 161* 211*   Lipid Profile: No results for input(s): CHOL, HDL, LDLCALC, TRIG, CHOLHDL, LDLDIRECT in the last 72 hours. Thyroid Function Tests: No results for input(s): TSH, T4TOTAL, FREET4, T3FREE, THYROIDAB in the last 72 hours. Anemia Panel: No results for input(s): VITAMINB12, FOLATE, FERRITIN, TIBC, IRON, RETICCTPCT in the last 72 hours. Sepsis Labs: Recent Labs  Lab 06/03/19 1548  LATICACIDVEN 1.2    Recent Results (from the past 240 hour(s))  Blood Culture (routine x 2)     Status: None (Preliminary result)   Collection Time: 06/03/19  4:04 PM   Specimen: BLOOD  Result Value Ref Range Status   Specimen Description   Final    BLOOD LEFT ANTECUBITAL Performed at [facility], [address] [place], [place] [address]    Special Requests   Final    BOTTLES DRAWN AEROBIC AND ANAEROBIC Blood Culture results may not  be optimal due to an inadequate volume of blood received in culture bottles Performed at [facility], [address] [place], [place] [address]    Culture   Final    NO GROWTH 2 DAYS Performed at [facility], [address], [place], [place] [address]    Report Status PENDING  Incomplete  Blood Culture (routine x 2)     Status: None (Preliminary result)   Collection Time: 06/03/19  4:41 PM   Specimen: BLOOD  Result Value Ref Range Status   Specimen Description   Final    BLOOD RIGHT ANTECUBITAL Performed at [facility], [address] [place], [place] [address]    Special Requests   Final    BOTTLES DRAWN AEROBIC AND ANAEROBIC Blood  Culture results may not be optimal due to an excessive volume of blood received in culture bottles Performed at [facility], [address] [place], [place] [address]    Culture   Final    NO GROWTH 2 DAYS Performed at [facility], [address] [place], [place] [address]    Report Status PENDING  Incomplete  SARS Coronavirus 2 (CEPHEID - Performed in [facility] hospital lab), Hosp Order     Status: None   Collection Time: 06/03/19  6:20 PM   Specimen: Nasopharyngeal Swab  Result Value Ref Range Status   SARS Coronavirus 2 NEGATIVE NEGATIVE Final    Comment: (NOTE) If result is NEGATIVE SARS-CoV-2 target nucleic acids are NOT DETECTED. The SARS-CoV-2 RNA is generally detectable in upper and lower  respiratory specimens during the acute phase of infection. The lowest  concentration of SARS-CoV-2 viral copies this assay can detect is 250  copies / mL. A negative result does not preclude SARS-CoV-2 infection  and should not be used as the sole basis for treatment or other  patient management decisions.  A negative result may occur with  improper specimen collection / handling, submission of specimen other  than nasopharyngeal swab, presence of viral mutation(s) within the  areas targeted by this assay, and inadequate number of viral copies  (<250 copies / mL). A negative result must be combined with clinical  observations, patient history, and epidemiological information. If result is POSITIVE SARS-CoV-2 target nucleic acids are DETECTED. The SARS-CoV-2 RNA is generally detectable in upper and lower  respiratory specimens dur ing the acute phase of infection.  Positive  results are indicative of active infection with SARS-CoV-2.  Clinical  correlation with patient history and other diagnostic information is  necessary to determine patient infection status.  Positive results do  not rule out bacterial infection or co-infection with other viruses. If  result is PRESUMPTIVE POSTIVE SARS-CoV-2 nucleic acids MAY BE PRESENT.   A presumptive positive result was obtained on the submitted specimen  and confirmed on repeat testing.  While 2019 novel coronavirus  (SARS-CoV-2) nucleic acids may be present in the submitted sample  additional confirmatory testing may be necessary for epidemiological  and / or clinical management purposes  to differentiate between  SARS-CoV-2 and other Sarbecovirus currently known to infect humans.  If clinically indicated additional testing with an alternate test  methodology [phone]) is advised. The SARS-CoV-2 RNA is generally  detectable in upper and lower respiratory sp ecimens during the acute  phase of infection. The expected result is Negative. Fact Sheet for Patients:  BoilerBrush.com.cy Fact Sheet for Healthcare Providers: https://pope.com/ This test is not yet approved or cleared by  the [employer] [employer] [place] FDA and has been authorized for detection and/or diagnosis of SARS-CoV-2 by FDA under an Emergency Use Authorization (EUA).  This EUA will remain in effect (meaning this test can be used) for the duration of the COVID-19 declaration under Section 564(b)(1) of the Act, 21 U.S.C. section 360bbb-3(b)(1), unless the authorization is terminated or revoked sooner. Performed at [facility], [address] [place], [place] [address]   Urine culture     Status: Abnormal (Preliminary result)   Collection Time: 06/03/19  9:30 PM   Specimen: In/Out Cath Urine  Result Value Ref Range Status   Specimen Description   Final    IN/OUT CATH URINE Performed at [facility], [address] [place], [place] [address]    Special Requests   Final    NONE Performed at [facility], [address] [place], [place] [address]    Culture (A)  Final    >=100,000 COLONIES/mL STAPHYLOCOCCUS AUREUS SUSCEPTIBILITIES TO  FOLLOW Performed at [facility], [address] [place], [place] [address]    Report Status PENDING  Incomplete  MRSA PCR Screening     Status: None   Collection Time: 06/04/19 12:06 PM   Specimen: Nasal Mucosa; Nasopharyngeal  Result Value Ref Range Status   MRSA by PCR NEGATIVE NEGATIVE Final    Comment:        The GeneXpert MRSA Assay (FDA approved for NASAL specimens only), is one component of a comprehensive MRSA colonization surveillance program. It is not intended to diagnose MRSA infection nor to guide or monitor treatment for MRSA infections. Performed at [facility], [address] [place], [place] [address]          Radiology Studies: Mr Foot Left Wo Contrast  Result Date: 06/04/2019 CLINICAL DATA:  Dry gangrene. Osteomyelitis suspected. Foot swelling. Diabetes. EXAM: MRI OF THE LEFT FOOT WITHOUT CONTRAST TECHNIQUE: Multiplanar, multisequence MR imaging of the left foot was performed. No intravenous contrast was administered. COMPARISON:  X-ray 06/03/2019 FINDINGS: Bones/Joint/Cartilage There is diffuse low T1 signal and corresponding high T2 signal within the proximal, middle, and distal phalanx of the fifth digit which extends to involve the fifth metatarsal head and neck. There is surrounding skin thickening and edema with a possible small ulceration along the dorsal lateral aspect. Low T1 and high T2 signal of the third and and fourth digits involving the middle and distal phalanxes as well as the distal most aspects of the proximal phalanxes. Similar although slightly less pronounced findings are present in the same distribution in the second digit. Patchy, non confluent low T1 signal within the proximal and distal phalanx of the great toe with associated bone marrow edema. Marginal erosion along the medial cortex of the first metatarsal head which may represent osteomyelitis versus sequela of gout. There is mild bone marrow edema within the first  metatarsal head. Moderate degenerative changes of the first MTP joint. Mild degenerative changes throughout the remaining visualized foot. Ligaments LisFranc ligament intact. Muscles and Tendons Diffuse atrophy and fatty infiltration of the intrinsic musculature of the foot. No tenosynovitis. Soft tissues Mild diffuse subcutaneous edema most pronounced at the distal aspect of the forefoot. Skin ulceration overlies the lateral aspect of the first metatarsal head with soft tissue abnormality extending to the medial cortex of the bone. IMPRESSION: 1. Findings highly suspicious for osteomyelitis of the fifth digit extending from the distal phalanx to the fifth metatarsal head and neck. 2. Medial  soft tissue ulceration overlying the first metatarsal head with underlying cortical erosion and marrow edema highly suspicious for osteomyelitis. 3. Low T1/high T2 marrow signal changes within the third and fourth digits from the distal aspects of the proximal phalanxes through the distal tufts, also concerning for osteomyelitis. 4. Findings suggestive of reactive osteitis versus early acute osteomyelitis involving the proximal and distal phalanx of the great toe as well as the second toe from the distal aspect of the proximal phalanx through the distal tuft. These results will be called to the ordering clinician or representative by the Radiologist Assistant, and communication documented in the PACS or zVision Dashboard. Electronically Signed   By: Duanne Guess M.D.   On: 06/04/2019 14:13   Dg Foot Complete Left  Result Date: 06/03/2019 CLINICAL DATA:  Left foot infection. EXAM: LEFT FOOT - COMPLETE 3+ VIEW COMPARISON:  None. FINDINGS: No osseous destruction or periosteal reaction. No acute fracture or dislocation. Mild first MTP joint osteoarthritis. Remaining joint spaces are relatively preserved. Osteopenia. Diffuse soft tissue swelling. IMPRESSION: 1. Diffuse soft tissue swelling.  No acute osseous abnormality.  Electronically Signed   By: Obie Dredge M.D.   On: 06/03/2019 16:39   Dg Foot Complete Right  Result Date: 06/03/2019 CLINICAL DATA:  Foot infection. EXAM: RIGHT FOOT COMPLETE - 3+ VIEW COMPARISON:  03/07/2018 FINDINGS: There is no fracture or dislocation or bone destruction. No discrete periosteal reaction. There is dorsal soft tissue swelling of the forefoot. Minimal arthritic changes in the midfoot. IMPRESSION: No radiographic evidence of osteomyelitis.  Soft tissue swelling. Electronically Signed   By: Francene Boyers M.D.   On: 06/03/2019 16:40   Vas [place] Lower Extremity Arterial Duplex  Result Date: 06/04/2019 LOWER EXTREMITY ARTERIAL DUPLEX STUDY Indications: Gangrene, and peripheral artery disease.  Current ABI: last ABI 02/2018 absent flow to left pedal arteries and toe Performing Technologist: Jeb Levering RDMS, RVT  Examination Guidelines: A complete evaluation includes B-mode imaging, spectral Doppler, color Doppler, and power Doppler as needed of all accessible portions of each vessel. Bilateral testing is considered an integral part of a complete examination. Limited examinations for reoccurring indications may be performed as noted.  +----------+--------+-----+---------------+----------+-------------------------+  LEFT       PSV cm/s Ratio Stenosis        Waveform   Comments                   +----------+--------+-----+---------------+----------+-------------------------+  CFA Prox   82                             monophasic collateral noted           +----------+--------+-----+---------------+----------+-------------------------+  CFA Mid    194                            monophasic                            +----------+--------+-----+---------------+----------+-------------------------+  CFA Distal                occluded                                              +----------+--------+-----+---------------+----------+-------------------------+  DFA  110                             monophasic could not visualize past                                                         proximal segment           +----------+--------+-----+---------------+----------+-------------------------+  SFA Prox                  occluded                                              +----------+--------+-----+---------------+----------+-------------------------+  SFA Mid                   occluded                                              +----------+--------+-----+---------------+----------+-------------------------+  SFA Distal                occluded                                              +----------+--------+-----+---------------+----------+-------------------------+  POP Prox   103            50-74% stenosis monophasic                            +----------+--------+-----+---------------+----------+-------------------------+  POP Mid    32                             monophasic                            +----------+--------+-----+---------------+----------+-------------------------+  POP Distal                                monophasic                            +----------+--------+-----+---------------+----------+-------------------------+  ATA Distal                occluded                                              +----------+--------+-----+---------------+----------+-------------------------+  PTA Distal                occluded                                              +----------+--------+-----+---------------+----------+-------------------------+  Heavily calcified arteries.  Summary: Left: Total occlusion noted in the superficial femoral artery. 50-74% stenosis noted in the popliteal artery. Total occlusion noted in the anterior tibial artery. Total occlusion noted in the posterior tibial artery. Short segment Proximal common femoral  is patent, as well as proximal profunda femoral.  See table(s) above for measurements and observations. Electronically signed by Sherald Hess MD on  06/04/2019 at 5:47:51 PM.    Final         Scheduled Meds:  amLODipine  10 mg Oral Daily   aspirin  81 mg Oral Daily   clopidogrel  75 mg Oral Daily   feeding supplement (ENSURE ENLIVE)  237 mL Oral BID BM   fentaNYL  1 patch Transdermal Q72H   furosemide  40 mg Oral QHS   heparin  5,000 Units Subcutaneous Q8H   insulin aspart  0-9 Units Subcutaneous TID WC   isosorbide mononitrate  60 mg Oral Daily   lisinopril  20 mg Oral BID   metoprolol succinate  100 mg Oral Daily   ranolazine  500 mg Oral BID   Continuous Infusions:  cefTRIAXone (ROCEPHIN)  IV 1 g (06/04/19 1716)   vancomycin       LOS: 2 days   Time spent= 35 mins    Meagan Spease Joline Maxcy, MD [facility]  If 7PM-7AM, please contact night-coverage [email or web address] 06/05/2019, 1:02 PM

## 2019-06-05 NOTE — Progress Notes (Signed)
   06/05/19 [facility] Notified of Pending Discharge  Successfully notified

## 2019-06-05 NOTE — Progress Notes (Signed)
[facility] here to get patient.  Assist PTAR to transfer to stretcher without difficulties.  Instructions given to PTAR regarding night time medication the patient will need to take.  Patient is leaving with glasses on.  Room searched for additional valuable, and patient states that she doesn't have any valuable here and were sent home via family.  Leaving unit now with 2 liters oxygen from PTAR and no distress noted.

## 2019-06-05 NOTE — Progress Notes (Signed)
[facility] in [place] can take patient today, however family back and forth if they would like patient to go today or tomorrow.   CSW is giving nurse number for [facility]AR should patient decide to go today. Number is [phone]  PTAR forms on chart.    [place], [place]

## 2019-06-05 NOTE — Plan of Care (Signed)
  Problem: Education: Goal: Knowledge of General Education information will improve Description: Including pain rating scale, medication(s)/side effects and non-pharmacologic comfort measures 06/05/2019 1840 by Chaney Malling, RN Outcome: Completed/Met 06/05/2019 1840 by Chaney Malling, RN Outcome: Adequate for Discharge   Problem: Health Behavior/Discharge Planning: Goal: Ability to manage health-related needs will improve 06/05/2019 1840 by Chaney Malling, RN Outcome: Completed/Met 06/05/2019 1840 by Chaney Malling, RN Outcome: Adequate for Discharge   Problem: Clinical Measurements: Goal: Ability to maintain clinical measurements within normal limits will improve 06/05/2019 1840 by Chaney Malling, RN Outcome: Completed/Met 06/05/2019 1840 by Chaney Malling, RN Outcome: Adequate for Discharge Goal: Will remain free from infection 06/05/2019 1840 by Chaney Malling, RN Outcome: Completed/Met 06/05/2019 1840 by Chaney Malling, RN Outcome: Adequate for Discharge Goal: Diagnostic test results will improve 06/05/2019 1840 by Chaney Malling, RN Outcome: Completed/Met 06/05/2019 1840 by Chaney Malling, RN Outcome: Adequate for Discharge Goal: Cardiovascular complication will be avoided 06/05/2019 1840 by Chaney Malling, RN Outcome: Completed/Met 06/05/2019 1840 by Chaney Malling, RN Outcome: Adequate for Discharge   Problem: Activity: Goal: Risk for activity intolerance will decrease 06/05/2019 1840 by Chaney Malling, RN Outcome: Completed/Met 06/05/2019 1840 by Chaney Malling, RN Outcome: Adequate for Discharge   Problem: Coping: Goal: Level of anxiety will decrease 06/05/2019 1840 by Chaney Malling, RN Outcome: Completed/Met 06/05/2019 1840 by Chaney Malling, RN Outcome: Adequate for Discharge   Problem: Elimination: Goal: Will not experience complications related to bowel motility 06/05/2019 1840 by Chaney Malling, RN Outcome: Completed/Met 06/05/2019 1840 by Chaney Malling,  RN Outcome: Adequate for Discharge Goal: Will not experience complications related to urinary retention 06/05/2019 1840 by Chaney Malling, RN Outcome: Completed/Met 06/05/2019 1840 by Chaney Malling, RN Outcome: Adequate for Discharge   Problem: Pain Managment: Goal: General experience of comfort will improve 06/05/2019 1840 by Chaney Malling, RN Outcome: Completed/Met 06/05/2019 1840 by Chaney Malling, RN Outcome: Adequate for Discharge   Problem: Safety: Goal: Ability to remain free from injury will improve 06/05/2019 1840 by Chaney Malling, RN Outcome: Completed/Met 06/05/2019 1840 by Chaney Malling, RN Outcome: Adequate for Discharge   Problem: Skin Integrity: Goal: Risk for impaired skin integrity will decrease 06/05/2019 1840 by Chaney Malling, RN Outcome: Completed/Met 06/05/2019 1840 by Chaney Malling, RN Outcome: Adequate for Discharge

## 2019-06-05 NOTE — Discharge Summary (Signed)
Physician Discharge Summary  Susa Griffinsell Till ZOX:[id number] DOB: 12-01-1921 DOA: 06/03/2019  PCP: Almetta LovelyHousecalls, Doctors Making  Admit date: 06/03/2019 Discharge date: 06/05/2019  Admitted From: ALF Disposition:  Hospice  Recommendations for Outpatient Follow-up:  1. Follow up with PCP in 1-2 weeks 2. Please obtain BMP/CBC in one week your next doctors visit.    Discharge Condition: Stable CODE STATUS: DNR Diet recommendation: As tolerated  Brief/Interim Summary: [age] year old with history of severe peripheral vascular disease, coronary artery disease, diastolic CHF, CVA with residual left-sided weakness, diabetes mellitus type 2, sick sinus syndrome status post pacemaker, essential hypertension, CKD stage III, hyperlipidemia admitted for infection of the left foot and right ankle. MRI findings were consistent with osteomyelitis.  He was seen by vascular due to poor circulation was found to have occlusion of femoral artery and only has a monophasic waveform in the profunda.  Patient was deemed not to be a surgical candidate.  Palliative care team was consulted. Family and patient opted for Hospice care therefore transition made. Stable for discharge to hospice home. Poor prognosis.     Discharge Diagnoses:  Principal Problem:   Cellulitis of left foot Active Problems:   Diabetes mellitus (HCC)   Hyperlipidemia   Stage 3 chronic kidney disease (HCC)   Chronic diastolic congestive heart failure (HCC)   Essential hypertension   History of CVA (cerebrovascular accident)   CAD (coronary artery disease)   Dry gangrene (HCC)   Pressure ulcer of sacral region, stage 1   Goals of care, counseling/discussion   Palliative care encounter  Refer to today's Progress Note  Transition patient to Hospice. No Abx or surgery at this time.    Consultations:  Palliative  Vascular  Subjective: Family would like to take the patient to hopsicel care. Patient in agreement.   Discharge  Exam: Vitals:   06/05/19 0815 06/05/19 1457  BP: (!) 139/58 129/79  Pulse: 68 67  Resp: 16 14  Temp: 97.9 F (36.6 C) 98.3 F (36.8 C)  SpO2: 98% 99%   Vitals:   06/05/19 0014 06/05/19 0350 06/05/19 0815 06/05/19 1457  BP: (!) 109/97 (!) 134/59 (!) 139/58 129/79  Pulse: 69 74 68 67  Resp: 17 18 16 14   Temp: (!) 97.4 F (36.3 C) 97.9 F (36.6 C) 97.9 F (36.6 C) 98.3 F (36.8 C)  TempSrc: Oral Oral Oral Oral  SpO2: 95% 96% 98% 99%  Weight:      Height:        General: Pt is alert, awake, not in acute distress Cardiovascular: RRR, S1/S2 +, no rubs, no gallops Respiratory: CTA bilaterally, no wheezing, no rhonchi Abdominal: Soft, NT, ND, bowel sounds + Extremities: no edema, no cyanosis Gangrene and dressing noted on her LE.   Discharge Instructions  Discharge Instructions    Diet - low sodium heart healthy   Complete by: As directed    Increase activity slowly   Complete by: As directed      Allergies as of 06/05/2019      Reactions   Statins Other (See Comments)   Muscle weakness/pain   Other Other (See Comments)   Reductase Inhibitors- "Allergic," per MAR/reaction not recalled by the patient "Dye"- "Allergic," per MAR/reaction not recalled by the patient   Amlodipine Other (See Comments)   Unknown   Celecoxib Nausea Only, Other (See Comments)   Codeine Other (See Comments)   "Allergic," per MAR/reaction not recalled by the patient   Naproxen Other (See Comments)   Unknown   Piroxicam Other (  See Comments)   "Allergic," per MAR/reaction not recalled by the patient      Medication List    STOP taking these medications   dextromethorphan-guaiFENesin 30-600 MG 12hr tablet Commonly known as: MUCINEX DM     TAKE these medications   acetaminophen 500 MG tablet Commonly known as: TYLENOL Take 500 mg by mouth 2 (two) times a day.   amLODipine 10 MG tablet Commonly known as: NORVASC Take 1 tablet (10 mg total) by mouth 2 (two) times daily.  in the am  and  in the pm What changed:   when to take this  additional instructions  Another medication with the same name was removed. Continue taking this medication, and follow the directions you see here.   aspirin 81 MG chewable tablet Chew 1 tablet (81 mg total) by mouth daily. With Food   cholecalciferol 25 MCG (1000 UT) tablet Commonly known as: VITAMIN D3 Take 1,000 Units by mouth daily.   clindamycin 300 MG capsule Commonly known as: CLEOCIN Take 300 mg by mouth 3 (three) times daily.   clopidogrel 75 MG tablet Commonly known as: PLAVIX Take 1 tablet (75 mg total) by mouth daily.   feeding supplement (ENSURE ENLIVE) Liqd Take 237 mLs by mouth 2 (two) times daily between meals.   fentaNYL 12 MCG/HR Commonly known as: DURAGESIC Place 1 patch onto the skin every 3 (three) days. Start taking on: June 08, 2019   furosemide 80 MG tablet Commonly known as: LASIX Take 80 mg by mouth daily. What changed: Another medication with the same name was changed. Make sure you understand how and when to take each.   furosemide 40 MG tablet Commonly known as: LASIX Take  in Am and  in PM. What changed:   how much to take  how to take this  when to take this  additional instructions   glimepiride 1 MG tablet Commonly known as: AMARYL Take 1 tablet (1 mg total) by mouth daily with breakfast.   isosorbide mononitrate 60 MG 24 hr tablet Commonly known as: IMDUR Take 1 tablet (60 mg total) by mouth daily.   lisinopril 20 MG tablet Commonly known as: ZESTRIL Take 1 tablet (20 mg total) by mouth 2 (two) times daily.   Magnesium 250 MG Tabs Take 250 mg by mouth at bedtime.   metoprolol succinate 100 MG 24 hr tablet Commonly known as: TOPROL-XL Take 1 tablet (100 mg total) by mouth daily.   multivitamin with minerals Tabs tablet Take 1 tablet by mouth daily.   nitroGLYCERIN 0.4 MG SL tablet Commonly known as: NITROSTAT Place 0.4 mg under the tongue every 5  (five) minutes x 3 doses as needed for chest pain (and call 9-1-1 and MD, if no relief).   ondansetron 4 MG tablet Commonly known as: ZOFRAN Take 4 mg by mouth every 6 (six) hours as needed for nausea or vomiting.   oxyCODONE 5 MG immediate release tablet Commonly known as: Oxy IR/ROXICODONE Take 0.5 tablets (2.5 mg total) by mouth every 4 (four) hours as needed for moderate pain.   ranolazine 500 MG 12 hr tablet Commonly known as: RANEXA Take 1 tablet (500 mg total) by mouth 2 (two) times daily.   senna 8.6 MG Tabs tablet Commonly known as: SENOKOT Take 2 tablets (17.2 mg total) by mouth at bedtime. What changed: when to take this       Allergies  Allergen Reactions  . Statins Other (See Comments)    Muscle weakness/pain  .  Other Other (See Comments)    Reductase Inhibitors- "Allergic," per MAR/reaction not recalled by the patient "Dye"- "Allergic," per MAR/reaction not recalled by the patient  . Amlodipine Other (See Comments)    Unknown   . Celecoxib Nausea Only and Other (See Comments)  . Codeine Other (See Comments)    "Allergic," per MAR/reaction not recalled by the patient  . Naproxen Other (See Comments)    Unknown   . Piroxicam Other (See Comments)    "Allergic," per MAR/reaction not recalled by the patient    You were cared for by a hospitalist during your hospital stay. If you have any questions about your discharge medications or the care you received while you were in the hospital after you are discharged, you can call the unit and asked to speak with the hospitalist on call if the hospitalist that took care of you is not available. Once you are discharged, your primary care physician will handle any further medical issues. Please note that no refills for any discharge medications will be authorized once you are discharged, as it is imperative that you return to your primary care physician (or establish a relationship with a primary care physician if you do not  have one) for your aftercare needs so that they can reassess your need for medications and monitor your lab values.   Procedures/Studies: Mr Foot Left Wo Contrast  Result Date: 06/04/2019 CLINICAL DATA:  Dry gangrene. Osteomyelitis suspected. Foot swelling. Diabetes. EXAM: MRI OF THE LEFT FOOT WITHOUT CONTRAST TECHNIQUE: Multiplanar, multisequence MR imaging of the left foot was performed. No intravenous contrast was administered. COMPARISON:  X-ray 06/03/2019 FINDINGS: Bones/Joint/Cartilage There is diffuse low T1 signal and corresponding high T2 signal within the proximal, middle, and distal phalanx of the fifth digit which extends to involve the fifth metatarsal head and neck. There is surrounding skin thickening and edema with a possible small ulceration along the dorsal lateral aspect. Low T1 and high T2 signal of the third and and fourth digits involving the middle and distal phalanxes as well as the distal most aspects of the proximal phalanxes. Similar although slightly less pronounced findings are present in the same distribution in the second digit. Patchy, non confluent low T1 signal within the proximal and distal phalanx of the great toe with associated bone marrow edema. Marginal erosion along the medial cortex of the first metatarsal head which may represent osteomyelitis versus sequela of gout. There is mild bone marrow edema within the first metatarsal head. Moderate degenerative changes of the first MTP joint. Mild degenerative changes throughout the remaining visualized foot. Ligaments LisFranc ligament intact. Muscles and Tendons Diffuse atrophy and fatty infiltration of the intrinsic musculature of the foot. No tenosynovitis. Soft tissues Mild diffuse subcutaneous edema most pronounced at the distal aspect of the forefoot. Skin ulceration overlies the lateral aspect of the first metatarsal head with soft tissue abnormality extending to the medial cortex of the bone. IMPRESSION: 1. Findings  highly suspicious for osteomyelitis of the fifth digit extending from the distal phalanx to the fifth metatarsal head and neck. 2. Medial soft tissue ulceration overlying the first metatarsal head with underlying cortical erosion and marrow edema highly suspicious for osteomyelitis. 3. Low T1/high T2 marrow signal changes within the third and fourth digits from the distal aspects of the proximal phalanxes through the distal tufts, also concerning for osteomyelitis. 4. Findings suggestive of reactive osteitis versus early acute osteomyelitis involving the proximal and distal phalanx of the great toe as well as  the second toe from the distal aspect of the proximal phalanx through the distal tuft. These results will be called to the ordering clinician or representative by the Radiologist Assistant, and communication documented in the PACS or zVision Dashboard. Electronically Signed   By: Duanne Guess M.D.   On: 06/04/2019 14:13   Dg Foot Complete Left  Result Date: 06/03/2019 CLINICAL DATA:  Left foot infection. EXAM: LEFT FOOT - COMPLETE 3+ VIEW COMPARISON:  None. FINDINGS: No osseous destruction or periosteal reaction. No acute fracture or dislocation. Mild first MTP joint osteoarthritis. Remaining joint spaces are relatively preserved. Osteopenia. Diffuse soft tissue swelling. IMPRESSION: 1. Diffuse soft tissue swelling.  No acute osseous abnormality. Electronically Signed   By: Obie Dredge M.D.   On: 06/03/2019 16:39   Dg Foot Complete Right  Result Date: 06/03/2019 CLINICAL DATA:  Foot infection. EXAM: RIGHT FOOT COMPLETE - 3+ VIEW COMPARISON:  03/07/2018 FINDINGS: There is no fracture or dislocation or bone destruction. No discrete periosteal reaction. There is dorsal soft tissue swelling of the forefoot. Minimal arthritic changes in the midfoot. IMPRESSION: No radiographic evidence of osteomyelitis.  Soft tissue swelling. Electronically Signed   By: Francene Boyers M.D.   On: 06/03/2019 16:40    Vas [place] Lower Extremity Arterial Duplex  Result Date: 06/04/2019 LOWER EXTREMITY ARTERIAL DUPLEX STUDY Indications: Gangrene, and peripheral artery disease.  Current ABI: last ABI 02/2018 absent flow to left pedal arteries and toe Performing Technologist: Jeb Levering RDMS, RVT  Examination Guidelines: A complete evaluation includes B-mode imaging, spectral Doppler, color Doppler, and power Doppler as needed of all accessible portions of each vessel. Bilateral testing is considered an integral part of a complete examination. Limited examinations for reoccurring indications may be performed as noted.  +----------+--------+-----+---------------+----------+-------------------------+ LEFT      PSV cm/sRatioStenosis       Waveform  Comments                  +----------+--------+-----+---------------+----------+-------------------------+ CFA Prox  82                          monophasiccollateral noted          +----------+--------+-----+---------------+----------+-------------------------+ CFA Mid   194                         monophasic                          +----------+--------+-----+---------------+----------+-------------------------+ CFA Distal             occluded                                           +----------+--------+-----+---------------+----------+-------------------------+ DFA       110                         monophasiccould not visualize past                                                  proximal segment          +----------+--------+-----+---------------+----------+-------------------------+ SFA Prox  occluded                                           +----------+--------+-----+---------------+----------+-------------------------+ SFA Mid                occluded                                           +----------+--------+-----+---------------+----------+-------------------------+ SFA Distal             occluded                                            +----------+--------+-----+---------------+----------+-------------------------+ POP Prox  103          50-74% stenosismonophasic                          +----------+--------+-----+---------------+----------+-------------------------+ POP Mid   32                          monophasic                          +----------+--------+-----+---------------+----------+-------------------------+ POP Distal                            monophasic                          +----------+--------+-----+---------------+----------+-------------------------+ ATA Distal             occluded                                           +----------+--------+-----+---------------+----------+-------------------------+ PTA Distal             occluded                                           +----------+--------+-----+---------------+----------+-------------------------+ Heavily calcified arteries.  Summary: Left: Total occlusion noted in the superficial femoral artery. 50-74% stenosis noted in the popliteal artery. Total occlusion noted in the anterior tibial artery. Total occlusion noted in the posterior tibial artery. Short segment Proximal common femoral  is patent, as well as proximal profunda femoral.  See table(s) above for measurements and observations. Electronically signed by Monica Martinez MD on 06/04/2019 at 5:47:51 PM.    Final       The results of significant diagnostics from this hospitalization (including imaging, microbiology, ancillary and laboratory) are listed below for reference.     Microbiology: Recent Results (from the past 240 hour(s))  Blood Culture (routine x 2)     Status: None (Preliminary result)   Collection Time: 06/03/19  4:04 PM   Specimen: BLOOD  Result Value Ref Range Status   Specimen Description   Final    BLOOD LEFT ANTECUBITAL Performed at [facility]  [place], [place]  [address]    Special Requests   Final    BOTTLES DRAWN AEROBIC AND ANAEROBIC Blood Culture results may not be optimal due to an inadequate volume of blood received in culture bottles Performed at [facility], [address] [place], [place] [address]    Culture   Final    NO GROWTH 2 DAYS Performed at [facility], [address] [place], [place] [address]    Report Status PENDING  Incomplete  Blood Culture (routine x 2)     Status: None (Preliminary result)   Collection Time: 06/03/19  4:41 PM   Specimen: BLOOD  Result Value Ref Range Status   Specimen Description   Final    BLOOD RIGHT ANTECUBITAL Performed at [facility], [address] [place], [place] [address]    Special Requests   Final    BOTTLES DRAWN AEROBIC AND ANAEROBIC Blood Culture results may not be optimal due to an excessive volume of blood received in culture bottles Performed at [facility], [address] [place], [place] [address]    Culture   Final    NO GROWTH 2 DAYS Performed at [facility], [address] [place], [place] [address]    Report Status PENDING  Incomplete  SARS Coronavirus 2 (CEPHEID - Performed in [facility] lab), Hosp Order     Status: None   Collection Time: 06/03/19  6:20 PM   Specimen: Nasopharyngeal Swab  Result Value Ref Range Status   SARS Coronavirus 2 NEGATIVE NEGATIVE Final    Comment: (NOTE) If result is NEGATIVE SARS-CoV-2 target nucleic acids are NOT DETECTED. The SARS-CoV-2 RNA is generally detectable in upper and lower  respiratory specimens during the acute phase of infection. The lowest  concentration of SARS-CoV-2 viral copies this assay can detect is 250  copies / mL. A negative result does not preclude SARS-CoV-2 infection  and should not be used as the sole basis for treatment or other  patient management decisions.  A negative result may occur with  improper specimen collection /  handling, submission of specimen other  than nasopharyngeal swab, presence of viral mutation(s) within the  areas targeted by this assay, and inadequate number of viral copies  (<250 copies / mL). A negative result must be combined with clinical  observations, patient history, and epidemiological information. If result is POSITIVE SARS-CoV-2 target nucleic acids are DETECTED. The SARS-CoV-2 RNA is generally detectable in upper and lower  respiratory specimens dur ing the acute phase of infection.  Positive  results are indicative of active infection with SARS-CoV-2.  Clinical  correlation with patient history and other diagnostic information is  necessary to determine patient infection status.  Positive results do  not rule out bacterial infection or co-infection with other viruses. If result is PRESUMPTIVE POSTIVE SARS-CoV-2 nucleic acids MAY BE PRESENT.   A presumptive positive result was obtained on the submitted specimen  and confirmed on repeat testing.  While 2019 novel coronavirus  (SARS-CoV-2) nucleic acids may be present in the submitted sample  additional confirmatory testing may be necessary for epidemiological  and / or clinical management purposes  to differentiate between  SARS-CoV-2 and other Sarbecovirus currently known to infect humans.  If clinically indicated additional testing with an alternate test  methodology [phone] is advised. The SARS-CoV-2 RNA is generally  detectable in upper and lower respiratory sp ecimens during the acute  phase of infection. The expected  result is Negative. Fact Sheet for Patients:  BoilerBrush.com.cy Fact Sheet for Healthcare Providers: https://pope.com/ This test is not yet approved or cleared by the [place] FDA and has been authorized for detection and/or diagnosis of SARS-CoV-2 by FDA under an Emergency Use Authorization (EUA).  This EUA will remain in effect (meaning this  test can be used) for the duration of the COVID-19 declaration under Section 564(b)(1) of the Act, 21 U.S.C. section 360bbb-3(b)(1), unless the authorization is terminated or revoked sooner. Performed at [facility], [address], [place], [place] [address]   Urine culture     Status: Abnormal (Preliminary result)   Collection Time: 06/03/19  9:30 PM   Specimen: In/Out Cath Urine  Result Value Ref Range Status   Specimen Description   Final    IN/OUT CATH URINE Performed at [facility], [address], [place], [place] [address]    Special Requests   Final    NONE Performed at [facility], [address] [place], [place] [address]    Culture (A)  Final    >=100,000 COLONIES/mL GRAM POSITIVE COCCI CULTURE REINCUBATED FOR BETTER GROWTH Performed at [facility], [address] [place], [place] [address]    Report Status PENDING  Incomplete  MRSA PCR Screening     Status: None   Collection Time: 06/04/19 12:06 PM   Specimen: Nasal Mucosa; Nasopharyngeal  Result Value Ref Range Status   MRSA by PCR NEGATIVE NEGATIVE Final    Comment:        The GeneXpert MRSA Assay (FDA approved for NASAL specimens only), is one component of a comprehensive MRSA colonization surveillance program. It is not intended to diagnose MRSA infection nor to guide or monitor treatment for MRSA infections. Performed at [facility], [address] [place], [place] [address]      Labs: BNP (last 3 results) Recent Labs    08/02/18 0000 10/18/18 2129  BNP 900.5* 462.4*   Basic Metabolic Panel: Recent Labs  Lab 05/31/19 1414 06/03/19 1548 06/05/19 0525  NA 137 136 140  K 4.6 3.9 4.2  CL 92* 93* 98  CO2 32 33* 32  GLUCOSE 144* 159* 183*  BUN 27* 23 19  CREATININE 1.13* 1.07* 0.93  CALCIUM 8.8* 8.5* 8.2*   Liver Function Tests: Recent Labs  Lab 06/03/19 1548  AST 12*  ALT 14  ALKPHOS 103  BILITOT 0.2*   PROT 6.3*  ALBUMIN 2.6*   No results for input(s): LIPASE, AMYLASE in the last 168 hours. No results for input(s): AMMONIA in the last 168 hours. CBC: Recent Labs  Lab 05/31/19 1414 06/03/19 1548 06/05/19 0525  WBC 13.5* 15.7* 13.2*  NEUTROABS 11.0* 13.4*  --   HGB 10.1* 9.0* 8.2*  HCT 31.5* 28.6* 26.0*  MCV 94.0 94.7 93.2  PLT 343 392 342   Cardiac Enzymes: No results for input(s): CKTOTAL, CKMB, CKMBINDEX, TROPONINI in the last 168 hours. BNP: Invalid input(s): POCBNP CBG: Recent Labs  Lab 06/04/19 1143 06/04/19 1603 06/04/19 2105 06/05/19 0811 06/05/19 1132  GLUCAP 259* 145* 210* 161* 211*   D-Dimer No results for input(s): DDIMER in the last 72 hours. Hgb A1c Recent Labs    06/03/19 1548  HGBA1C 6.6*   Lipid Profile No results for input(s): CHOL, HDL, LDLCALC, TRIG, CHOLHDL, LDLDIRECT in the last 72 hours. Thyroid function studies No results for input(s): TSH, T4TOTAL, T3FREE, THYROIDAB in the last 72 hours.  Invalid input(s):  FREET3 Anemia work up No results for input(s): VITAMINB12, FOLATE, FERRITIN, TIBC, IRON, RETICCTPCT in the last 72 hours. Urinalysis    Component Value Date/Time   COLORURINE YELLOW 06/03/2019 2130   APPEARANCEUR CLOUDY (A) 06/03/2019 2130   LABSPEC 1.010 06/03/2019 2130   PHURINE 8.0 06/03/2019 2130   GLUCOSEU NEGATIVE 06/03/2019 2130   HGBUR NEGATIVE 06/03/2019 2130   BILIRUBINUR NEGATIVE 06/03/2019 2130   KETONESUR NEGATIVE 06/03/2019 2130   PROTEINUR NEGATIVE 06/03/2019 2130   NITRITE NEGATIVE 06/03/2019 2130   LEUKOCYTESUR LARGE (A) 06/03/2019 2130   Sepsis Labs Invalid input(s): PROCALCITONIN,  WBC,  LACTICIDVEN Microbiology Recent Results (from the past 240 hour(s))  Blood Culture (routine x 2)     Status: None (Preliminary result)   Collection Time: 06/03/19  4:04 PM   Specimen: BLOOD  Result Value Ref Range Status   Specimen Description   Final    BLOOD LEFT ANTECUBITAL Performed at [facility], [address], [place], [place] [address]    Special Requests   Final    BOTTLES DRAWN AEROBIC AND ANAEROBIC Blood Culture results may not be optimal due to an inadequate volume of blood received in culture bottles Performed at [facility], [address] [place], [place] [address]    Culture   Final    NO GROWTH 2 DAYS Performed at [facility], [address] [place], [place] [address]    Report Status PENDING  Incomplete  Blood Culture (routine x 2)     Status: None (Preliminary result)   Collection Time: 06/03/19  4:41 PM   Specimen: BLOOD  Result Value Ref Range Status   Specimen Description   Final    BLOOD RIGHT ANTECUBITAL Performed at [facility], [address] [place], [place] [address]    Special Requests   Final    BOTTLES DRAWN AEROBIC AND ANAEROBIC Blood Culture results may not be optimal due to an excessive volume of blood received in culture bottles Performed at [facility], [address], [place], [place] [address]    Culture   Final    NO GROWTH 2 DAYS Performed at [facility], [address] [place], [place] [address]    Report Status PENDING  Incomplete  SARS Coronavirus 2 (CEPHEID - Performed in [facility] hospital lab), Hosp Order     Status: None   Collection Time: 06/03/19  6:20 PM   Specimen: Nasopharyngeal Swab  Result Value Ref Range Status   SARS Coronavirus 2 NEGATIVE NEGATIVE Final    Comment: (NOTE) If result is NEGATIVE SARS-CoV-2 target nucleic acids are NOT DETECTED. The SARS-CoV-2 RNA is generally detectable in upper and lower  respiratory specimens during the acute phase of infection. The lowest  concentration of SARS-CoV-2 viral copies this assay can detect is 250  copies / mL. A negative result does not preclude SARS-CoV-2 infection  and should not be used as the sole basis for treatment or other  patient management decisions.  A negative result  may occur with  improper specimen collection / handling, submission of specimen other  than nasopharyngeal swab, presence of viral mutation(s) within the  areas targeted by this assay, and inadequate number of viral copies  (<250 copies / mL). A negative result must be combined with clinical  observations, patient history, and epidemiological information. If result is POSITIVE SARS-CoV-2 target nucleic acids are DETECTED. The SARS-CoV-2 RNA is generally detectable in upper and lower  respiratory specimens dur ing the acute phase of infection.  Positive  results are indicative of active infection with SARS-CoV-2.  Clinical  correlation with patient history and other diagnostic information is  necessary to determine patient infection status.  Positive results do  not rule out bacterial infection or co-infection with other viruses. If result is PRESUMPTIVE POSTIVE SARS-CoV-2 nucleic acids MAY BE PRESENT.   A presumptive positive result was obtained on the submitted specimen  and confirmed on repeat testing.  While 2019 novel coronavirus  (SARS-CoV-2) nucleic acids may be present in the submitted sample  additional confirmatory testing may be necessary for epidemiological  and / or clinical management purposes  to differentiate between  SARS-CoV-2 and other Sarbecovirus currently known to infect humans.  If clinically indicated additional testing with an alternate test  methodology [phone]([id number]) is advised. The SARS-CoV-2 RNA is generally  detectable in upper and lower respiratory sp ecimens during the acute  phase of infection. The expected result is Negative. Fact Sheet for Patients:  BoilerBrush.com.cyhttps://www.fda.gov/media/136312/download Fact Sheet for Healthcare Providers: https://pope.com/https://www.fda.gov/media/136313/download This test is not yet approved or cleared by the [place] FDA and has been authorized for detection and/or diagnosis of SARS-CoV-2 by FDA under an Emergency Use Authorization (EUA).   This EUA will remain in effect (meaning this test can be used) for the duration of the COVID-19 declaration under Section 564(b)(1) of the Act, 21 U.S.C. section 360bbb-3(b)(1), unless the authorization is terminated or revoked sooner. Performed at [facility], [address] [place], [place] [address]   Urine culture     Status: Abnormal (Preliminary result)   Collection Time: 06/03/19  9:30 PM   Specimen: In/Out Cath Urine  Result Value Ref Range Status   Specimen Description   Final    IN/OUT CATH URINE Performed at [facility], [address] [place], [place] [address]    Special Requests   Final    NONE Performed at [facility], [address] [place], [place] [address]    Culture (A)  Final    >=100,000 COLONIES/mL GRAM POSITIVE COCCI CULTURE REINCUBATED FOR BETTER GROWTH Performed at [facility], [address] [place], [place] [address]    Report Status PENDING  Incomplete  MRSA PCR Screening     Status: None   Collection Time: 06/04/19 12:06 PM   Specimen: Nasal Mucosa; Nasopharyngeal  Result Value Ref Range Status   MRSA by PCR NEGATIVE NEGATIVE Final    Comment:        The GeneXpert MRSA Assay (FDA approved for NASAL specimens only), is one component of a comprehensive MRSA colonization surveillance program. It is not intended to diagnose MRSA infection nor to guide or monitor treatment for MRSA infections. Performed at [facility], [address] [place], [place] [address]      Time coordinating discharge:  I have spent 35 minutes face to face with the patient and on the ward discussing the patients care, assessment, plan and disposition with other care givers. >50% of the time was devoted counseling the patient about the risks and benefits of treatment/Discharge disposition and coordinating care.   SIGNED:   Dimple NanasAnkit Chirag Amin, MD  [facility] 06/05/2019, 4:41  PM   If 7PM-7AM, please contact night-coverage [email or web address]

## 2019-06-06 LAB — URINE CULTURE: Culture: >=100000 — AB

## 2019-06-08 LAB — CULTURE, BLOOD (ROUTINE X 2)
Culture: NO GROWTH
Culture: NO GROWTH

## 2019-06-11 ENCOUNTER — Encounter (HOSPITAL_BASED_OUTPATIENT_CLINIC_OR_DEPARTMENT_OTHER): Payer: Medicare Other | Attending: Internal Medicine

## 2019-06-12 DIAGNOSIS — M869 Osteomyelitis, unspecified: Secondary | ICD-10-CM

## 2019-06-13 DEATH — deceased
